# Patient Record
Sex: Female | Born: 1960
Health system: Southern US, Community
[De-identification: ages and names within clinical notes are randomized; demographics above are authoritative.]

## PROBLEM LIST (undated history)

## (undated) DIAGNOSIS — Z8711 Personal history of peptic ulcer disease: Secondary | ICD-10-CM

## (undated) DIAGNOSIS — F329 Major depressive disorder, single episode, unspecified: Secondary | ICD-10-CM

## (undated) DIAGNOSIS — M1990 Unspecified osteoarthritis, unspecified site: Secondary | ICD-10-CM

## (undated) DIAGNOSIS — E7800 Pure hypercholesterolemia, unspecified: Secondary | ICD-10-CM

## (undated) DIAGNOSIS — I219 Acute myocardial infarction, unspecified: Secondary | ICD-10-CM

## (undated) DIAGNOSIS — F419 Anxiety disorder, unspecified: Secondary | ICD-10-CM

## (undated) DIAGNOSIS — Z720 Tobacco use: Secondary | ICD-10-CM

## (undated) DIAGNOSIS — N8110 Cystocele, unspecified: Secondary | ICD-10-CM

## (undated) DIAGNOSIS — I10 Essential (primary) hypertension: Secondary | ICD-10-CM

## (undated) DIAGNOSIS — F32A Depression, unspecified: Secondary | ICD-10-CM

## (undated) DIAGNOSIS — Z8719 Personal history of other diseases of the digestive system: Secondary | ICD-10-CM

## (undated) DIAGNOSIS — I2510 Atherosclerotic heart disease of native coronary artery without angina pectoris: Secondary | ICD-10-CM

## (undated) DIAGNOSIS — E119 Type 2 diabetes mellitus without complications: Secondary | ICD-10-CM

## (undated) HISTORY — DX: Tobacco use: Z72.0

## (undated) HISTORY — DX: Cystocele, unspecified: N81.10

## (undated) HISTORY — PX: DILATION AND CURETTAGE OF UTERUS: SHX78

## (undated) HISTORY — DX: Depression, unspecified: F32.A

## (undated) HISTORY — DX: Personal history of peptic ulcer disease: Z87.11

## (undated) HISTORY — DX: Essential (primary) hypertension: I10

## (undated) HISTORY — DX: Unspecified osteoarthritis, unspecified site: M19.90

## (undated) HISTORY — DX: Major depressive disorder, single episode, unspecified: F32.9

## (undated) HISTORY — DX: Acute myocardial infarction, unspecified: I21.9

## (undated) HISTORY — PX: PILONIDAL CYST EXCISION: SHX744

## (undated) HISTORY — DX: Anxiety disorder, unspecified: F41.9

## (undated) HISTORY — DX: Personal history of other diseases of the digestive system: Z87.19

---

## 1999-12-26 ENCOUNTER — Other Ambulatory Visit: Admission: RE | Admit: 1999-12-26 | Discharge: 1999-12-26 | Payer: Self-pay | Admitting: *Deleted

## 2001-01-13 ENCOUNTER — Other Ambulatory Visit: Admission: RE | Admit: 2001-01-13 | Discharge: 2001-01-13 | Payer: Self-pay | Admitting: *Deleted

## 2004-04-03 ENCOUNTER — Other Ambulatory Visit: Admission: RE | Admit: 2004-04-03 | Discharge: 2004-04-03 | Payer: Self-pay | Admitting: Obstetrics and Gynecology

## 2004-06-05 ENCOUNTER — Ambulatory Visit (HOSPITAL_COMMUNITY): Admission: RE | Admit: 2004-06-05 | Discharge: 2004-06-05 | Payer: Self-pay | Admitting: Gastroenterology

## 2004-06-21 ENCOUNTER — Encounter: Admission: RE | Admit: 2004-06-21 | Discharge: 2004-06-21 | Payer: Self-pay | Admitting: Gastroenterology

## 2004-12-30 DIAGNOSIS — I219 Acute myocardial infarction, unspecified: Secondary | ICD-10-CM

## 2004-12-30 HISTORY — DX: Acute myocardial infarction, unspecified: I21.9

## 2004-12-30 HISTORY — PX: CORONARY ANGIOPLASTY WITH STENT PLACEMENT: SHX49

## 2013-11-09 DIAGNOSIS — N8110 Cystocele, unspecified: Secondary | ICD-10-CM | POA: Insufficient documentation

## 2016-06-21 DIAGNOSIS — S92301A Fracture of unspecified metatarsal bone(s), right foot, initial encounter for closed fracture: Secondary | ICD-10-CM | POA: Diagnosis not present

## 2016-06-25 ENCOUNTER — Ambulatory Visit: Payer: Self-pay | Admitting: Student

## 2017-05-01 ENCOUNTER — Ambulatory Visit (INDEPENDENT_AMBULATORY_CARE_PROVIDER_SITE_OTHER): Payer: BLUE CROSS/BLUE SHIELD | Admitting: Physician Assistant

## 2017-05-01 ENCOUNTER — Encounter: Payer: Self-pay | Admitting: Physician Assistant

## 2017-05-01 VITALS — BP 130/82 | Temp 98.3°F | Ht 65.75 in | Wt 172.0 lb

## 2017-05-01 DIAGNOSIS — I10 Essential (primary) hypertension: Secondary | ICD-10-CM

## 2017-05-01 DIAGNOSIS — Z720 Tobacco use: Secondary | ICD-10-CM | POA: Diagnosis not present

## 2017-05-01 DIAGNOSIS — F329 Major depressive disorder, single episode, unspecified: Secondary | ICD-10-CM

## 2017-05-01 DIAGNOSIS — F419 Anxiety disorder, unspecified: Secondary | ICD-10-CM | POA: Diagnosis not present

## 2017-05-01 DIAGNOSIS — E119 Type 2 diabetes mellitus without complications: Secondary | ICD-10-CM

## 2017-05-01 DIAGNOSIS — F339 Major depressive disorder, recurrent, unspecified: Secondary | ICD-10-CM | POA: Insufficient documentation

## 2017-05-01 DIAGNOSIS — F32A Depression, unspecified: Secondary | ICD-10-CM

## 2017-05-01 DIAGNOSIS — I252 Old myocardial infarction: Secondary | ICD-10-CM

## 2017-05-01 DIAGNOSIS — Z8739 Personal history of other diseases of the musculoskeletal system and connective tissue: Secondary | ICD-10-CM | POA: Diagnosis not present

## 2017-05-01 DIAGNOSIS — J309 Allergic rhinitis, unspecified: Secondary | ICD-10-CM | POA: Diagnosis not present

## 2017-05-01 LAB — CBC WITH DIFFERENTIAL/PLATELET
BASOS: 0 %
Basophils Absolute: 0 10*3/uL (ref 0.0–0.2)
EOS (ABSOLUTE): 0.3 10*3/uL (ref 0.0–0.4)
Eos: 3 %
Hematocrit: 39.9 % (ref 34.0–46.6)
Hemoglobin: 12.9 g/dL (ref 11.1–15.9)
IMMATURE GRANS (ABS): 0 10*3/uL (ref 0.0–0.1)
Immature Granulocytes: 0 %
Lymphocytes Absolute: 2.2 10*3/uL (ref 0.7–3.1)
Lymphs: 25 %
MCH: 27.3 pg (ref 26.6–33.0)
MCHC: 32.3 g/dL (ref 31.5–35.7)
MCV: 85 fL (ref 79–97)
MONOS ABS: 0.4 10*3/uL (ref 0.1–0.9)
Monocytes: 5 %
NEUTROS ABS: 6 10*3/uL (ref 1.4–7.0)
Neutrophils: 67 %
PLATELETS: 206 10*3/uL (ref 150–379)
RBC: 4.72 x10E6/uL (ref 3.77–5.28)
RDW: 16.3 % — AB (ref 12.3–15.4)
WBC: 9 10*3/uL (ref 3.4–10.8)

## 2017-05-01 LAB — CMP14+EGFR
A/G RATIO: 1.2 (ref 1.2–2.2)
ALT: 23 IU/L (ref 0–32)
AST: 19 IU/L (ref 0–40)
Albumin: 4.2 g/dL (ref 3.5–5.5)
Alkaline Phosphatase: 113 IU/L (ref 39–117)
BILIRUBIN TOTAL: 0.3 mg/dL (ref 0.0–1.2)
BUN/Creatinine Ratio: 20 (ref 9–23)
BUN: 20 mg/dL (ref 6–24)
CHLORIDE: 99 mmol/L (ref 96–106)
CO2: 21 mmol/L (ref 20–29)
Calcium: 9.6 mg/dL (ref 8.7–10.2)
Creatinine, Ser: 1.01 mg/dL — ABNORMAL HIGH (ref 0.57–1.00)
GFR calc non Af Amer: 62 mL/min/{1.73_m2} (ref >59)
GFR, EST AFRICAN AMERICAN: 71 mL/min/{1.73_m2} (ref >59)
GLOBULIN, TOTAL: 3.4 g/dL (ref 1.5–4.5)
Glucose: 248 mg/dL — ABNORMAL HIGH (ref 65–99)
POTASSIUM: 3.9 mmol/L (ref 3.5–5.2)
SODIUM: 138 mmol/L (ref 134–144)
TOTAL PROTEIN: 7.6 g/dL (ref 6.0–8.5)

## 2017-05-01 LAB — LIPID PANEL
Chol/HDL Ratio: 3.3 ratio (ref 0.0–4.4)
Cholesterol, Total: 175 mg/dL (ref 100–199)
HDL: 53 mg/dL (ref >39)
LDL Calculated: 86 mg/dL (ref 0–99)
TRIGLYCERIDES: 182 mg/dL — AB (ref 0–149)
VLDL Cholesterol Cal: 36 mg/dL (ref 5–40)

## 2017-05-01 LAB — POCT URINALYSIS DIP (MANUAL ENTRY)
BILIRUBIN UA: NEGATIVE
BILIRUBIN UA: NEGATIVE mg/dL
Glucose, UA: >=1000 mg/dL — AB
Leukocytes, UA: NEGATIVE
Nitrite, UA: NEGATIVE
PH UA: 5.5 (ref 5.0–8.0)
Protein Ur, POC: NEGATIVE mg/dL
RBC UA: NEGATIVE
Spec Grav, UA: 1.01 (ref 1.010–1.025)
Urobilinogen, UA: 0.2 E.U./dL

## 2017-05-01 LAB — POCT GLYCOSYLATED HEMOGLOBIN (HGB A1C): HEMOGLOBIN A1C: 10.6

## 2017-05-01 LAB — GLUCOSE, POCT (MANUAL RESULT ENTRY): POC GLUCOSE: 248 mg/dL — AB (ref 70–99)

## 2017-05-01 MED ORDER — LORATADINE 10 MG PO TABS: 10.0000 mg | ORAL_TABLET | Freq: Every day | ORAL | 3 refills | Status: DC

## 2017-05-01 MED ORDER — LISINOPRIL 10 MG PO TABS: 40.0000 mg | ORAL_TABLET | Freq: Every day | ORAL | 1 refills | Status: DC

## 2017-05-01 MED ORDER — GLIPIZIDE 10 MG PO TABS: 10.0000 mg | ORAL_TABLET | Freq: Two times a day (BID) | ORAL | 0 refills | Status: DC

## 2017-05-01 MED ORDER — DAPAGLIFLOZIN PROPANEDIOL 10 MG PO TABS: 10.0000 mg | ORAL_TABLET | Freq: Every day | ORAL | 0 refills | Status: DC

## 2017-05-01 MED ORDER — ASPIRIN 325 MG PO TBEC: 325.0000 mg | DELAYED_RELEASE_TABLET | Freq: Every day | ORAL | 3 refills | Status: DC

## 2017-05-01 MED ORDER — MELOXICAM 15 MG PO TABS: 15.0000 mg | ORAL_TABLET | Freq: Every day | ORAL | 0 refills | Status: DC | PRN

## 2017-05-01 MED ORDER — BUPROPION HCL ER (XL) 150 MG PO TB24: 150.0000 mg | ORAL_TABLET | Freq: Every day | ORAL | 0 refills | Status: DC

## 2017-05-01 MED ORDER — SIMVASTATIN 40 MG PO TABS: 40.0000 mg | ORAL_TABLET | Freq: Every day | ORAL | 3 refills | Status: DC

## 2017-05-01 MED ORDER — HYDROCHLOROTHIAZIDE 25 MG PO TABS: 25.0000 mg | ORAL_TABLET | Freq: Every day | ORAL | 1 refills | Status: DC

## 2017-05-01 MED ORDER — METOPROLOL TARTRATE 25 MG PO TABS: 25.0000 mg | ORAL_TABLET | Freq: Two times a day (BID) | ORAL | 1 refills | Status: DC

## 2017-05-01 NOTE — Patient Instructions (Addendum)
Ask your insurance about getting pneumovax 23 vaccine.    For diabetes, continue medications as prescribed. You need to focus on decreasing any extra carbs and sweets in your diet. Your fasting blood sugar goal is between 70-100. Right now you running at 240. Please check sugars daily. I have placed a referral for diabetes nutrition, which I highly recommend. They should call you within one-two weeks. I recommend you follow up for repeat glucose in office in 2 weeks for a full diabetes follow up visit. Make sure you are fasting at this visit, no food or drink 8 hours prior to visit. Below is some info about diabetes for you to read.   For high blood pressure, I have given you enough refills for the next six months. BP gaol is <140/90.  For anxiety and depression, I have refilled wellbutrin at 141m daily. We can monitor this and it may have to be increased to 3073mdaily but we will do this gradually.   We should have your lab results within one week and will contact you with these results. Thank you for letting me participate in your health and well being.  Blood Glucose Monitoring, Adult Monitoring your blood sugar (glucose) helps you manage your diabetes. It also helps you and your health care provider determine how well your diabetes management plan is working. Blood glucose monitoring involves checking your blood glucose as often as directed, and keeping a record (log) of your results over time. Why should I monitor my blood glucose? Checking your blood glucose regularly can:  Help you understand how food, exercise, illnesses, and medicines affect your blood glucose.  Let you know what your blood glucose is at any time. You can quickly tell if you are having low blood glucose (hypoglycemia) or high blood glucose (hyperglycemia).  Help you and your health care provider adjust your medicines as needed.  When should I check my blood glucose? Follow instructions from your health care provider  about how often to check your blood glucose. This may depend on:  The type of diabetes you have.  How well-controlled your diabetes is.  Medicines you are taking.  If you have type 1 diabetes:  Check your blood glucose at least 2 times a day.  Also check your blood glucose: ? Before every insulin injection. ? Before and after exercise. ? Between meals. ? 2 hours after a meal. ? Occasionally between 2:00 a.m. and 3:00 a.m., as directed. ? Before potentially dangerous tasks, like driving or using heavy machinery. ? At bedtime.  You may need to check your blood glucose more often, up to 6-10 times a day: ? If you use an insulin pump. ? If you need multiple daily injections (MDI). ? If your diabetes is not well-controlled. ? If you are ill. ? If you have a history of severe hypoglycemia. ? If you have a history of not knowing when your blood glucose is getting low (hypoglycemia unawareness). If you have type 2 diabetes:  If you take insulin or other diabetes medicines, check your blood glucose at least 2 times a day.  If you are on intensive insulin therapy, check your blood glucose at least 4 times a day. Occasionally, you may also need to check between 2:00 a.m. and 3:00 a.m., as directed.  Also check your blood glucose: ? Before and after exercise. ? Before potentially dangerous tasks, like driving or using heavy machinery.  You may need to check your blood glucose more often if: ? Your  medicine is being adjusted. ? Your diabetes is not well-controlled. ? You are ill. What is a blood glucose log?  A blood glucose log is a record of your blood glucose readings. It helps you and your health care provider: ? Look for patterns in your blood glucose over time. ? Adjust your diabetes management plan as needed.  Every time you check your blood glucose, write down your result and notes about things that may be affecting your blood glucose, such as your diet and exercise for the  day.  Most glucose meters store a record of glucose readings in the meter. Some meters allow you to download your records to a computer. How do I check my blood glucose? Follow these steps to get accurate readings of your blood glucose: Supplies needed   Blood glucose meter.  Test strips for your meter. Each meter has its own strips. You must use the strips that come with your meter.  A needle to prick your finger (lancet). Do not use lancets more than once.  A device that holds the lancet (lancing device).  A journal or log book to write down your results. Procedure  Wash your hands with soap and water.  Prick the side of your finger (not the tip) with the lancet. Use a different finger each time.  Gently rub the finger until a small drop of blood appears.  Follow instructions that come with your meter for inserting the test strip, applying blood to the strip, and using your blood glucose meter.  Write down your result and any notes. Alternative testing sites  Some meters allow you to use areas of your body other than your finger (alternative sites) to test your blood.  If you think you may have hypoglycemia, or if you have hypoglycemia unawareness, do not use alternative sites. Use your finger instead.  Alternative sites may not be as accurate as the fingers, because blood flow is slower in these areas. This means that the result you get may be delayed, and it may be different from the result that you would get from your finger.  The most common alternative sites are: ? Forearm. ? Thigh. ? Palm of the hand. Additional tips  Always keep your supplies with you.  If you have questions or need help, all blood glucose meters have a 24-hour "hotline" number that you can call. You may also contact your health care provider.  After you use a few boxes of test strips, adjust (calibrate) your blood glucose meter by following instructions that came with your meter. This  information is not intended to replace advice given to you by your health care provider. Make sure you discuss any questions you have with your health care provider. Document Released: 09/11/2003 Document Revised: 03/28/2016 Document Reviewed: 02/18/2016 Elsevier Interactive Patient Education  2017 [place] for Diabetes Mellitus, Adult Carbohydrate counting is a method for keeping track of how many carbohydrates you eat. Eating carbohydrates naturally increases the amount of sugar (glucose) in the blood. Counting how many carbohydrates you eat helps keep your blood glucose within normal limits, which helps you manage your diabetes (diabetes mellitus). It is important to know how many carbohydrates you can safely have in each meal. This is different for every person. A diet and nutrition specialist (registered dietitian) can help you make a meal plan and calculate how many carbohydrates you should have at each meal and snack. Carbohydrates are found in the following foods:  Grains, such  as breads and cereals.  Dried beans and soy products.  Starchy vegetables, such as potatoes, peas, and corn.  Fruit and fruit juices.  Milk and yogurt.  Sweets and snack foods, such as cake, cookies, candy, chips, and soft drinks.  How do I count carbohydrates? There are two ways to count carbohydrates in food. You can use either of the methods or a combination of both. Reading "Nutrition Facts" on packaged food The "Nutrition Facts" list is included on the labels of almost all packaged foods and beverages in the [place] It includes:  The serving size.  Information about nutrients in each serving, including the grams (g) of carbohydrate per serving.  To use the "Nutrition Facts":  Decide how many servings you will have.  Multiply the number of servings by the number of carbohydrates per serving.  The resulting number is the total amount of carbohydrates that you will be  having.  Learning standard serving sizes of other foods When you eat foods containing carbohydrates that are not packaged or do not include "Nutrition Facts" on the label, you need to measure the servings in order to count the amount of carbohydrates:  Measure the foods that you will eat with a food scale or measuring cup, if needed.  Decide how many standard-size servings you will eat.  Multiply the number of servings by 15. Most carbohydrate-rich foods have about 15 g of carbohydrates per serving. ? For example, if you eat 8 oz (170 g) of strawberries, you will have eaten 2 servings and 30 g of carbohydrates (2 servings x 15 g = 30 g).  For foods that have more than one food mixed, such as soups and casseroles, you must count the carbohydrates in each food that is included.  The following list contains standard serving sizes of common carbohydrate-rich foods. Each of these servings has about 15 g of carbohydrates:   hamburger bun or  English muffin.   oz (15 mL) syrup.   oz (14 g) jelly.  1 slice of bread.  1 six-inch tortilla.  3 oz (85 g) cooked rice or pasta.  4 oz (113 g) cooked dried beans.  4 oz (113 g) starchy vegetable, such as peas, corn, or potatoes.  4 oz (113 g) hot cereal.  4 oz (113 g) mashed potatoes or  of a large baked potato.  4 oz (113 g) canned or frozen fruit.  4 oz (120 mL) fruit juice.  4-6 crackers.  6 chicken nuggets.  6 oz (170 g) unsweetened dry cereal.  6 oz (170 g) plain fat-free yogurt or yogurt sweetened with artificial sweeteners.  8 oz (240 mL) milk.  8 oz (170 g) fresh fruit or one small piece of fruit.  24 oz (680 g) popped popcorn.  Example of carbohydrate counting Sample meal  3 oz (85 g) chicken breast.  6 oz (170 g) brown rice.  4 oz (113 g) corn.  8 oz (240 mL) milk.  8 oz (170 g) strawberries with sugar-free whipped topping. Carbohydrate calculation 1. Identify the foods that contain  carbohydrates: ? Rice. ? Corn. ? Milk. ? Strawberries. 2. Calculate how many servings you have of each food: ? 2 servings rice. ? 1 serving corn. ? 1 serving milk. ? 1 serving strawberries. 3. Multiply each number of servings by 15 g: ? 2 servings rice x 15 g = 30 g. ? 1 serving corn x 15 g = 15 g. ? 1 serving milk x 15 g = 15 g. ?  1 serving strawberries x 15 g = 15 g. 4. Add together all of the amounts to find the total grams of carbohydrates eaten: ? 30 g + 15 g + 15 g + 15 g = 75 g of carbohydrates total. This information is not intended to replace advice given to you by your health care provider. Make sure you discuss any questions you have with your health care provider. Document Released: 09/08/2005 Document Revised: 03/28/2016 Document Reviewed: 02/20/2016 Elsevier Interactive Patient Education  2018 [employer]. Diabetes Mellitus and Food It is important for you to manage your blood sugar (glucose) level. Your blood glucose level can be greatly affected by what you eat. Eating healthier foods in the appropriate amounts throughout the day at about the same time each day will help you control your blood glucose level. It can also help slow or prevent worsening of your diabetes mellitus. Healthy eating may even help you improve the level of your blood pressure and reach or maintain a healthy weight. General recommendations for healthful eating and cooking habits include:  Eating meals and snacks regularly. Avoid going long periods of time without eating to lose weight.  Eating a diet that consists mainly of plant-based foods, such as fruits, vegetables, nuts, legumes, and whole grains.  Using low-heat cooking methods, such as baking, instead of high-heat cooking methods, such as deep frying.  Work with your dietitian to make sure you understand how to use the Nutrition Facts information on food labels. How can food affect me? Carbohydrates Carbohydrates affect your blood  glucose level more than any other type of food. Your dietitian will help you determine how many carbohydrates to eat at each meal and teach you how to count carbohydrates. Counting carbohydrates is important to keep your blood glucose at a healthy level, especially if you are using insulin or taking certain medicines for diabetes mellitus. Alcohol Alcohol can cause sudden decreases in blood glucose (hypoglycemia), especially if you use insulin or take certain medicines for diabetes mellitus. Hypoglycemia can be a life-threatening condition. Symptoms of hypoglycemia (sleepiness, dizziness, and disorientation) are similar to symptoms of having too much alcohol. If your health care provider has given you approval to drink alcohol, do so in moderation and use the following guidelines:  Women should not have more than one drink per day, and men should not have more than two drinks per day. One drink is equal to: ? 12 oz of beer. ? 5 oz of wine. ? 1 oz of hard liquor.  Do not drink on an empty stomach.  Keep yourself hydrated. Have water, diet soda, or unsweetened iced tea.  Regular soda, juice, and other mixers might contain a lot of carbohydrates and should be counted.  What foods are not recommended? As you make food choices, it is important to remember that all foods are not the same. Some foods have fewer nutrients per serving than other foods, even though they might have the same number of calories or carbohydrates. It is difficult to get your body what it needs when you eat foods with fewer nutrients. Examples of foods that you should avoid that are high in calories and carbohydrates but low in nutrients include:  Trans fats (most processed foods list trans fats on the Nutrition Facts label).  Regular soda.  Juice.  Candy.  Sweets, such as cake, pie, doughnuts, and cookies.  Fried foods.  What foods can I eat? Eat nutrient-rich foods, which will nourish your body and keep you  healthy.  The food you should eat also will depend on several factors, including:  The calories you need.  The medicines you take.  Your weight.  Your blood glucose level.  Your blood pressure level.  Your cholesterol level.  You should eat a variety of foods, including:  Protein. ? Lean cuts of meat. ? Proteins low in saturated fats, such as fish, egg whites, and beans. Avoid processed meats.  Fruits and vegetables. ? Fruits and vegetables that may help control blood glucose levels, such as apples, mangoes, and yams.  Dairy products. ? Choose fat-free or low-fat dairy products, such as milk, yogurt, and cheese.  Grains, bread, pasta, and rice. ? Choose whole grain products, such as multigrain bread, whole oats, and brown rice. These foods may help control blood pressure.  Fats. ? Foods containing healthful fats, such as nuts, avocado, olive oil, canola oil, and fish.  Does everyone with diabetes mellitus have the same meal plan? Because every person with diabetes mellitus is different, there is not one meal plan that works for everyone. It is very important that you meet with a dietitian who will help you create a meal plan that is just right for you. This information is not intended to replace advice given to you by your health care provider. Make sure you discuss any questions you have with your health care provider. Document Released: 06/05/2005 Document Revised: 02/14/2016 Document Reviewed: 08/05/2013 Elsevier Interactive Patient Education  2017 [employer].  IF you received an x-ray today, you will receive an invoice from [facility]. Please contact [facility] at [phone] with questions or concerns regarding your invoice.   IF you received labwork today, you will receive an invoice from [facility]. Please contact [facility] at [phone] with questions or concerns regarding your invoice.   Our billing staff will not be able to assist you with  questions regarding bills from these companies.  You will be contacted with the lab results as soon as they are available. The fastest way to get your results is to activate your My Chart account. Instructions are located on the last page of this paperwork. If you have not heard from [place] regarding the results in 2 weeks, please contact this office.

## 2017-05-01 NOTE — Progress Notes (Addendum)
Victoria Eaton  MRN: [id number] DOB: 08-11-1961  Subjective:  Victoria Eaton is a 56 y.o. female seen in office today for a chief complaint of medication refill for chronic conditions. Pt moved here from [place] about a year ago. Has not been to a doctor since she has been here. She took her last pill of most of her medications (she thinks?) this morning. Has been out of wellbutrin for at least a month.   Chronic disorders: 1) T2DM: Dx made in 2014. Controlled on farxiga 37m daily and glipizide 151mBID. Does not take metformin because she remembers reacting badly to it. She does not check sugars at home. She cannot recall what her last A1C was. Denies blurred vision, polyuria, polyphagia, polydipsia, decreased urine, abdominal pain, nausea, vomiting, and parasthesias. Avoids excess sugars and carbs. Does not eat rice, limits bread with meals. Drinks water and diet coke and wine. No structured exercise but does walk daily. Smokes 0.5 ppd x 45 years. No known complications. Has not received any vaccines.   2) HTN: Dx made years ago. Controlled on HCTZ 2583mnd lisinopril 90m14mily and metoprolol tartrate 25mg44m.   2) Depression and Anxiety: She has a lot of stress and sad mood. Daughter is bipolar, husband is a recovering alcoholic. Works three jobs ([employer], [employer]). Was taking buproprion hcl 100mg 62m Has been out of this medication for quite some time. She states she felt less sad and less anxious on this medication.   4) Seasonal allergies: Takes claritin 90mg d51m. She has a bunny at home and is allergic to it every now and then but is controlled on claritin 90mg da40m   5) Arthritis in left foot: Takes meloxicam 15mg onc59mweek as needed for pain. Will tylenol first for pain.  6) Hx of MI: Had MI in 2006. Takes aspirin 325mg dail39metoprolol tartrate 25mg BID, 26msimvastatin 40mg at nig38mPt is still smoking 0.5 ppd. Not interested in stopping at this time. Pt  has FH of heart disease in father, cannot recall what age. She has never followed cardiology.   Other OTC meds: multivitmain and krill oil daily  Pt had coffee this morning but no food.   Review of Systems  Constitutional: Negative for chills, diaphoresis, fatigue and fever.  Eyes: Negative for visual disturbance.  Respiratory: Negative for cough and shortness of breath.   Cardiovascular: Negative for chest pain, palpitations and leg swelling.  Gastrointestinal: Negative for abdominal pain, anal bleeding, nausea and vomiting.  Neurological: Negative for dizziness, weakness, light-headedness and headaches.    There are no active problems to display for this patient.   No current outpatient prescriptions on file prior to visit.   No current facility-administered medications on file prior to visit.     Allergies  Allergen Reactions  . Kiwi Extract Hives  . Shrimp [Shellfish Allergy] Hives  . Penicillins     Reaction as a child       Social History   Social History  . Marital status: Married    Spouse name: N/A  . Number of children: N/A  . Years of education: N/A   Occupational History  . Not on file.   Social History Main Topics  . Smoking status: Current Every Day Smoker    Packs/day: 0.50    Years: 45.00    Types: Cigarettes    Start date: 05/01/1972  . Smokeless tobacco: Never Used  . Alcohol use 12.0 oz/week  20 Glasses of wine per week  . Drug use: No  . Sexual activity: No   Other Topics Concern  . Not on file   Social History Narrative  . No narrative on file    Objective:  BP 130/82 (BP Location: Left Arm, Patient Position: Sitting, Cuff Size: Large)   Temp 98.3 F (36.8 C) (Oral)   Ht 5' 5.75" (1.67 m)   Wt 172 lb (78 kg)   BMI 27.97 kg/m   Physical Exam  Constitutional: She is oriented to person, place, and time and well-developed, well-nourished, and in no distress.  HENT:  Head: Normocephalic and atraumatic.  Eyes: Conjunctivae are  normal.  Neck: Normal range of motion.  Cardiovascular: Normal rate, regular rhythm and normal heart sounds.   Pulmonary/Chest: Effort normal and breath sounds normal.  Abdominal: Soft. Bowel sounds are normal. There is no tenderness.  Neurological: She is alert and oriented to person, place, and time. Gait normal.  Skin: Skin is warm and dry.  Psychiatric: Affect normal.  Vitals reviewed.    Results for orders placed or performed in visit on 05/01/17 (from the past 24 hour(s))  POCT glucose (manual entry)     Status: Abnormal   Collection Time: 05/01/17  9:55 AM  Result Value Ref Range   POC Glucose 248 (A) 70 - 99 mg/dl  POCT glycosylated hemoglobin (Hb A1C)     Status: None   Collection Time: 05/01/17  9:59 AM  Result Value Ref Range   Hemoglobin A1C 10.6   POCT urinalysis dipstick     Status: Abnormal   Collection Time: 05/01/17 10:16 AM  Result Value Ref Range   Color, UA yellow yellow   Clarity, UA clear clear   Glucose, UA >=1,000 (A) negative mg/dL   Bilirubin, UA negative negative   Ketones, POC UA negative negative mg/dL   Spec Grav, UA 1.010 1.010 - 1.025   Blood, UA negative negative   pH, UA 5.5 5.0 - 8.0   Protein Ur, POC negative negative mg/dL   Urobilinogen, UA 0.2 0.2 or 1.0 E.U./dL   Nitrite, UA Negative Negative   Leukocytes, UA Negative Negative    Assessment and Plan :  Requested that patient contact her prior PCP office in [place] and have them fax over her previous medical charts pertaining to her chronic disorders for better management of her choric conditions.   1. Type 2 diabetes mellitus without complication, without long-term current use of insulin (HCC) Uncontrolled. Pt is asymptomatic. Had in depth discussion about lifestyle modifications. Given educational material on diet and diabetes. Encouraged to check FBS daily, she has a monitor at home. FBS goal is 70-100. Follow up in 2 weeks for proper diabetes office visit. Plan to recheck POCT  glucose at that time.  - POCT glucose (manual entry) - POCT glycosylated hemoglobin (Hb A1C) - POCT urinalysis dipstick - CBC with Differential/Platelet - CMP14+EGFR - Lipid panel - Microalbumin/Creatinine Ratio, Urine - glipiZIDE (GLUCOTROL) 10 MG tablet; Take 1 tablet (10 mg total) by mouth 2 (two) times daily before a meal.  Dispense: 180 tablet; Refill: 0 - dapagliflozin propanediol (FARXIGA) 10 MG TABS tablet; Take 10 mg by mouth daily.  Dispense: 90 tablet; Refill: 0 - Ambulatory referral to diabetic education - Ambulatory referral to Ophthalmology  2. Essential hypertension Controlled. Continue medications as prescribed.  - CMP14+EGFR - hydrochlorothiazide (HYDRODIURIL) 25 MG tablet; Take 1 tablet (25 mg total) by mouth daily.  Dispense: 90 tablet; Refill: 1 - lisinopril (PRINIVIL,ZESTRIL)  10 MG tablet; Take 4 tablets (40 mg total) by mouth at bedtime.  Dispense: 90 tablet; Refill: 1  3. Anxiety and depression Uncontrolled. Plan to restart wellbutrin at 111m daily. Will reevaluate at follow up, may need to increase to 3019mdaily at that time.  - buPROPion (WELLBUTRIN XL) 150 MG 24 hr tablet; Take 1 tablet (150 mg total) by mouth daily.  Dispense: 90 tablet; Refill: 0  4. History of MI (myocardial infarction) Continue medications as prescribed.  - metoprolol tartrate (LOPRESSOR) 25 MG tablet; Take 1 tablet (25 mg total) by mouth 2 (two) times daily.  Dispense: 180 tablet; Refill: 1 - aspirin 325 MG EC tablet; Take 1 tablet (325 mg total) by mouth at bedtime.  Dispense: 90 tablet; Refill: 3 - simvastatin (ZOCOR) 40 MG tablet; Take 1 tablet (40 mg total) by mouth at bedtime.  Dispense: 90 tablet; Refill: 3  5. Allergic rhinitis, unspecified seasonality, unspecified trigger - loratadine (CLARITIN) 10 MG tablet; Take 1 tablet (10 mg total) by mouth daily.  Dispense: 90 tablet; Refill: 3  6. History of arthritis - meloxicam (MOBIC) 15 MG tablet; Take 1 tablet (15 mg total) by  mouth daily as needed for pain.  Dispense: 30 tablet; Refill: 0  7. Tobacco abuse Smoking cessation provided to patient. She is not interested in quitting smoking at this time.   BrTenna DelainePA-C  [facility]/06/2017 6:42 PM

## 2017-05-02 LAB — MICROALBUMIN / CREATININE URINE RATIO
Creatinine, Urine: 37.1 mg/dL
MICROALB/CREAT RATIO: 26.7 mg/g{creat} (ref 0.0–30.0)
Microalbumin, Urine: 9.9 ug/mL

## 2017-05-06 ENCOUNTER — Encounter: Payer: Self-pay | Admitting: Radiology

## 2017-05-20 ENCOUNTER — Ambulatory Visit (INDEPENDENT_AMBULATORY_CARE_PROVIDER_SITE_OTHER): Payer: BLUE CROSS/BLUE SHIELD | Admitting: Physician Assistant

## 2017-05-20 ENCOUNTER — Encounter: Payer: Self-pay | Admitting: Physician Assistant

## 2017-05-20 VITALS — BP 138/78 | HR 68 | Temp 97.9°F | Resp 18 | Ht 65.63 in | Wt 166.5 lb

## 2017-05-20 DIAGNOSIS — R194 Change in bowel habit: Secondary | ICD-10-CM

## 2017-05-20 DIAGNOSIS — Z9119 Patient's noncompliance with other medical treatment and regimen: Secondary | ICD-10-CM

## 2017-05-20 DIAGNOSIS — Z91013 Allergy to seafood: Secondary | ICD-10-CM | POA: Diagnosis not present

## 2017-05-20 DIAGNOSIS — Z91199 Patient's noncompliance with other medical treatment and regimen due to unspecified reason: Secondary | ICD-10-CM

## 2017-05-20 DIAGNOSIS — E119 Type 2 diabetes mellitus without complications: Secondary | ICD-10-CM | POA: Diagnosis not present

## 2017-05-20 LAB — GLUCOSE, POCT (MANUAL RESULT ENTRY): POC GLUCOSE: 206 mg/dL — AB (ref 70–99)

## 2017-05-20 MED ORDER — EPINEPHRINE 0.3 MG/0.3ML IJ SOAJ: 0.3000 mg | Freq: Once | INTRAMUSCULAR | 1 refills | Status: DC | PRN

## 2017-05-20 MED ORDER — METFORMIN HCL ER 500 MG PO TB24: ORAL_TABLET | ORAL | 0 refills | Status: DC

## 2017-05-20 MED ORDER — LISINOPRIL 40 MG PO TABS: 40.0000 mg | ORAL_TABLET | Freq: Every day | ORAL | 1 refills | Status: DC

## 2017-05-20 NOTE — Progress Notes (Signed)
MRN: [id number]  Subjective:   Victoria Eaton is a 56 y.o. female who presents for follow up on elevated blood sugars. Pt last ate 2 hours ago. Patient last seen on 05/01/17 for medication refill all of her chronic conditions. In terms of type 2 diabetes she had been out of her medication for some time. Blood glucose was 248 and A1C was 10.6. She was restarted on glipizide and farxiga and told to return in 2 weeks.  T2DM dx was made in 2014. She has been controlled on farxiga 65m daily and glipizide 150mBID. Pt does not know why she was taken off metformin but does not recall a bad reaction to it.   Since last visit, patient is checking home blood sugars. Home blood sugar records: BGs range between 140 and 170. Current symptoms include none. Patient denies foot ulcerations, increased appetite, nausea, paresthesia of the feet, polydipsia, polyuria, visual disturbances, vomiting and weight loss. Patient is not checking their feet daily. No foot concerns.  Diet consists of a variety of things. She eats red meat, chicken, breads, pastas, vegetables, and fruits. Drinks mostly water. Does not exercise.Smokes daily.   Known diabetic complications: none  Immunizations: Flu vaccine: 05/16/17, pneumococal vaccine: never  Other concerns:  1) Needs refill for epipen for shellfish allergy. Notes she had an allergic reaction to shellfish once in her past and was given Rx for epipen. Has not had to use it since. Her last one expired. Would also like referral to allergy specialist for food allergy testing as she is concerned as to what other food allergies she may have.   2) Questions why I did not refill lialda last time. When she went to pick up her meds at [facility] they told her she needed refills of it. She has been out of it for >49m42monthStates she takes it for ulcers. [place] Was supposed to follow up but did not go. Has occassional bowel urgency, frequency,  mucopurulent and bloody stools. Was dx with "ulcers" in the early 2000s. Has been on some medication since but she cannot recall what it was. Last colonoscopy was maybe 2016. Was told to follow up in one year but did not follow up.     Objective:   PHYSICAL EXAM BP 138/78 (BP Location: Right Arm, Patient Position: Sitting, Cuff Size: Normal)   Pulse 68   Temp 97.9 F (36.6 C) (Oral)   Resp 18   Ht 5' 5.63" (1.667 m)   Wt 166 lb 8 oz (75.5 kg)   SpO2 98%   BMI 27.18 kg/m   Physical Exam  Constitutional: She is oriented to person, place, and time. She appears well-developed and well-nourished.  HENT:  Head: Normocephalic and atraumatic.  Eyes: Conjunctivae are normal.  Neck: Normal range of motion.  Cardiovascular: Normal rate, regular rhythm and normal heart sounds.   Pulmonary/Chest: Effort normal and breath sounds normal.  Abdominal: Soft. Normal appearance. There is no tenderness.  Neurological: She is alert and oriented to person, place, and time.  Skin: Skin is warm and dry.  Psychiatric: She has a normal mood and affect.  Vitals reviewed.   Diabetic Foot Exam - Simple   Simple Foot Form Visual Inspection No deformities, no ulcerations, no other skin breakdown bilaterally:  Yes Sensation Testing Intact to touch and monofilament testing bilaterally:  Yes Pulse Check Posterior Tibialis and Dorsalis pulse intact bilaterally:  Yes Comments No toe nail on big to both  feet     Results for orders placed or performed in visit on 05/20/17 (from the past 24 hour(s))  POCT glucose (manual entry)     Status: Abnormal   Collection Time: 05/20/17 11:19 AM  Result Value Ref Range   POC Glucose 206 (A) 70 - 99 mg/dl    Assessment and Plan :   1. Type 2 diabetes mellitus without complication, without long-term current use of insulin (HCC) Glucose remains elevated despite restarting medication. Recommend starting metformin at this time, pt agrees with tx plan. Will slowly  taper up to metformin 1014m BID. She has not followed up with the diabetic nutrition referral that was placed during her last visit. States she has not checked her voicemail but will do that today. Encouraged lifestyle modifications. Continue checking blood glucose at home. Return in 3 months for reevaluation.  -Declines pneumovax today - HM Diabetes Foot Exam - POCT glucose (manual entry) - metFORMIN (GLUCOPHAGE XR) 500 MG 24 hr tablet; Wk 1:1 tablet daily.Wk 2:2 tablets daily.Wk 3: 2 tablets in am and 1 in pm daily.Wk 4 and so on: 2 tablets in am and pm with food daily.  Dispense: 360 tablet; Refill: 0  2. Frequent bowel movements Pt is unsure as to the exact condition she was taking lialda for and has not contacted [place] discussed last [place]) to have her medical records sent here. I have therefore placed referral to GI and will have them further evaluate her for her symptoms. Strongly encouraged pt to bring her prior medical records to this visit.  - Ambulatory referral to Gastroenterology  3. Allergy to shellfish - EPINEPHrine (EPIPEN 2-PAK) 0.3 mg/0.3 mL IJ SOAJ injection; Inject 0.3 mLs (0.3 mg total) into the muscle once as needed.  Dispense: 1 Device; Refill: 1 - Ambulatory referral to Allergy  4. Medical non-compliance  BTenna Delaine PA-C  [facility]8/30/2018 10:40 PM

## 2017-05-20 NOTE — Patient Instructions (Addendum)
For better diabetes control, I recommend starting metformin again along with your other medications. We will taper you up to 1062m twice daily. Continue checking FBS daily. I have given you some info below about diabetes and nutrition. I do recommend following up with diabetes education. They should have contacted you by now so make sure you check your voicemail and return their call. Please follow up in 3 months in office for diabetes follow up.   For ulcers, please follow up with GI. I have placed a referral and they will contact you within the 1-2 weeks.   For mammogram, the info to schedule one is below.   IF you received an x-ray today, you will receive an [facility]. Please contact [facility] at [phone] questions or concerns regarding your invoice.   IF you received labwork today, you will receive an invoice from [place] Please contact [facility] at [phone] questions or concerns regarding your invoice.   Our billing staff will not be able to assist you with questions regarding bills from these companies.  You will be contacted with the lab results as soon as they are available. The fastest way to get your results is to activate your My Chart account. Instructions are located on the last page of this paperwork. If you have not heard from [place] results in 2 weeks, please contact this office.    We recommend that you schedule a mammogram for breast cancer screening. Typically, you do not need a referral to do this. Please contact a local imaging center to schedule your mammogram.  [facility] ([phone] *ask for the Radiology Department [facility] [place] - [phone] [facility] - [phone] - [phone] *[facility]- ([phone] *ask  for the Radiology Department [facility] - [phone] *ask for the [place]- ([phone]   Diabetes Mellitus and Food It is important for you to manage your blood sugar (glucose) level. Your blood glucose level can be greatly affected by what you eat. Eating healthier foods in the appropriate amounts throughout the day at about the same time each day will help you control your blood glucose level. It can also help slow or prevent worsening of your diabetes mellitus. Healthy eating may even help you improve the level of your blood pressure and reach or maintain a healthy weight. General recommendations for healthful eating and cooking habits include:  Eating meals and snacks regularly. Avoid going long periods of time without eating to lose weight.  Eating a diet that consists mainly of plant-based foods, such as fruits, vegetables, nuts, legumes, and whole grains.  Using low-heat cooking methods, such as baking, instead of high-heat cooking methods, such as deep frying.  Work with your dietitian to make sure you understand how to use the Nutrition Facts information on food labels. How can food affect me? Carbohydrates Carbohydrates affect your blood glucose level more than any other type of food. Your dietitian will help you determine how many carbohydrates to eat at each meal and teach you how to count carbohydrates. Counting carbohydrates is important to keep your blood glucose at a healthy level, especially if you are using insulin or taking certain medicines for diabetes mellitus. Alcohol Alcohol can cause sudden decreases in blood glucose (hypoglycemia), especially if  you use insulin or take certain medicines for diabetes mellitus. Hypoglycemia can be a life-threatening condition. Symptoms of hypoglycemia (sleepiness, dizziness, and disorientation) are similar to symptoms of having too much alcohol. If your health care provider has given  you approval to drink alcohol, do so in moderation and use the following guidelines:  Women should not have more than one drink per day, and men should not have more than two drinks per day. One drink is equal to: ? 12 oz of beer. ? 5 oz of wine. ? 1 oz of hard liquor.  Do not drink on an empty stomach.  Keep yourself hydrated. Have water, diet soda, or unsweetened iced tea.  Regular soda, juice, and other mixers might contain a lot of carbohydrates and should be counted.  What foods are not recommended? As you make food choices, it is important to remember that all foods are not the same. Some foods have fewer nutrients per serving than other foods, even though they might have the same number of calories or carbohydrates. It is difficult to get your body what it needs when you eat foods with fewer nutrients. Examples of foods that you should avoid that are high in calories and carbohydrates but low in nutrients include:  Trans fats (most processed foods list trans fats on the Nutrition Facts label).  Regular soda.  Juice.  Candy.  Sweets, such as cake, pie, doughnuts, and cookies.  Fried foods.  What foods can I eat? Eat nutrient-rich foods, which will nourish your body and keep you healthy. The food you should eat also will depend on several factors, including:  The calories you need.  The medicines you take.  Your weight.  Your blood glucose level.  Your blood pressure level.  Your cholesterol level.  You should eat a variety of foods, including:  Protein. ? Lean cuts of meat. ? Proteins low in saturated fats, such as fish, egg whites, and beans. Avoid processed meats.  Fruits and vegetables. ? Fruits and vegetables that may help control blood glucose levels, such as apples, mangoes, and yams.  Dairy products. ? Choose fat-free or low-fat dairy products, such as milk, yogurt, and cheese.  Grains, bread, pasta, and rice. ? Choose whole grain products, such  as multigrain bread, whole oats, and brown rice. These foods may help control blood pressure.  Fats. ? Foods containing healthful fats, such as nuts, avocado, olive oil, canola oil, and fish.  Does everyone with diabetes mellitus have the same meal plan? Because every person with diabetes mellitus is different, there is not one meal plan that works for everyone. It is very important that you meet with a dietitian who will help you create a meal plan that is just right for you. This information is not intended to replace advice given to you by your health care provider. Make sure you discuss any questions you have with your health care provider. Document Released: 06/05/2005 Document Revised: 02/14/2016 Document Reviewed: 08/05/2013 Elsevier Interactive Patient Education  2017 [employer].

## 2017-06-17 DIAGNOSIS — H10023 Other mucopurulent conjunctivitis, bilateral: Secondary | ICD-10-CM | POA: Diagnosis not present

## 2017-07-07 ENCOUNTER — Encounter: Payer: Self-pay | Admitting: Allergy & Immunology

## 2017-08-06 ENCOUNTER — Other Ambulatory Visit: Payer: Self-pay | Admitting: Physician Assistant

## 2017-08-06 DIAGNOSIS — E119 Type 2 diabetes mellitus without complications: Secondary | ICD-10-CM

## 2017-08-06 DIAGNOSIS — F32A Depression, unspecified: Secondary | ICD-10-CM

## 2017-08-06 DIAGNOSIS — F329 Major depressive disorder, single episode, unspecified: Secondary | ICD-10-CM

## 2017-08-06 DIAGNOSIS — F419 Anxiety disorder, unspecified: Principal | ICD-10-CM

## 2017-08-06 NOTE — Telephone Encounter (Signed)
Please advise/refill  buPROPion (WELLBUTRIN XL)

## 2017-08-07 ENCOUNTER — Other Ambulatory Visit: Payer: Self-pay | Admitting: Physician Assistant

## 2017-08-07 DIAGNOSIS — E119 Type 2 diabetes mellitus without complications: Secondary | ICD-10-CM

## 2017-08-07 NOTE — Telephone Encounter (Signed)
Last HgA1C was 10.6 on 05/01/17; Does pt need appt before meds can be refilled?

## 2017-08-10 ENCOUNTER — Ambulatory Visit: Payer: BLUE CROSS/BLUE SHIELD | Admitting: Family Medicine

## 2017-08-12 NOTE — Telephone Encounter (Signed)
Pt has scheduled appt on 12/17, can we refill until that date

## 2017-09-01 ENCOUNTER — Telehealth: Payer: Self-pay | Admitting: Physician Assistant

## 2017-09-01 NOTE — Telephone Encounter (Signed)
Copied from [place] [id number]. Topic: Quick Communication - Rx Refill/Question >> Sep 01, 2017 10:30 AM Patrice Paradise wrote: Has the patient contacted their pharmacy? Yes.   metFORMIN (GLUCOPHAGE XR) 500 MG 24 hr tablet   (Agent: If no, request that the patient contact the pharmacy for the refill.)  Preferred Pharmacy (with phone number or street name):  [facility] [address] - [place], [place] - [address], [address] [place] [address] Phone: [phone] Fax: [phone]   Agent: Please be advised that RX refills may take up to 3 business days. We ask that you follow-up with your pharmacy.

## 2017-09-03 ENCOUNTER — Other Ambulatory Visit: Payer: Self-pay | Admitting: *Deleted

## 2017-09-03 DIAGNOSIS — E119 Type 2 diabetes mellitus without complications: Secondary | ICD-10-CM

## 2017-09-03 MED ORDER — METFORMIN HCL ER 500 MG PO TB24: ORAL_TABLET | ORAL | 0 refills | Status: DC

## 2017-09-03 NOTE — Telephone Encounter (Signed)
Prescription refilled. Pt has office visit on 12/17.

## 2017-09-07 ENCOUNTER — Encounter: Payer: Self-pay | Admitting: Family Medicine

## 2017-09-07 ENCOUNTER — Ambulatory Visit: Payer: BLUE CROSS/BLUE SHIELD | Admitting: Family Medicine

## 2017-09-07 VITALS — BP 126/72 | HR 70 | Temp 98.6°F | Resp 16 | Ht 65.63 in | Wt 160.4 lb

## 2017-09-07 DIAGNOSIS — E119 Type 2 diabetes mellitus without complications: Secondary | ICD-10-CM | POA: Diagnosis not present

## 2017-09-07 DIAGNOSIS — F329 Major depressive disorder, single episode, unspecified: Secondary | ICD-10-CM

## 2017-09-07 DIAGNOSIS — Z720 Tobacco use: Secondary | ICD-10-CM

## 2017-09-07 DIAGNOSIS — E785 Hyperlipidemia, unspecified: Secondary | ICD-10-CM

## 2017-09-07 DIAGNOSIS — F419 Anxiety disorder, unspecified: Secondary | ICD-10-CM

## 2017-09-07 DIAGNOSIS — I10 Essential (primary) hypertension: Secondary | ICD-10-CM

## 2017-09-07 DIAGNOSIS — R197 Diarrhea, unspecified: Secondary | ICD-10-CM | POA: Diagnosis not present

## 2017-09-07 DIAGNOSIS — Z598 Other problems related to housing and economic circumstances: Secondary | ICD-10-CM | POA: Diagnosis not present

## 2017-09-07 DIAGNOSIS — Z5181 Encounter for therapeutic drug level monitoring: Secondary | ICD-10-CM | POA: Diagnosis not present

## 2017-09-07 DIAGNOSIS — Z5989 Other problems related to housing and economic circumstances: Secondary | ICD-10-CM

## 2017-09-07 DIAGNOSIS — F32A Depression, unspecified: Secondary | ICD-10-CM

## 2017-09-07 DIAGNOSIS — N8110 Cystocele, unspecified: Secondary | ICD-10-CM | POA: Diagnosis not present

## 2017-09-07 LAB — POCT GLYCOSYLATED HEMOGLOBIN (HGB A1C): Hemoglobin A1C: 8.2

## 2017-09-07 MED ORDER — ATORVASTATIN CALCIUM 40 MG PO TABS: 40.0000 mg | ORAL_TABLET | Freq: Every day | ORAL | 1 refills | Status: DC

## 2017-09-07 MED ORDER — SERTRALINE HCL 50 MG PO TABS: 50.0000 mg | ORAL_TABLET | Freq: Every day | ORAL | 1 refills | Status: DC

## 2017-09-07 MED ORDER — SERTRALINE HCL 50 MG PO TABS: 50.0000 mg | ORAL_TABLET | Freq: Every day | ORAL | 3 refills | Status: DC

## 2017-09-07 MED ORDER — SITAGLIPTIN PHOSPHATE 100 MG PO TABS: 100.0000 mg | ORAL_TABLET | Freq: Every day | ORAL | 1 refills | Status: DC

## 2017-09-07 NOTE — Patient Instructions (Addendum)
We recommend that you schedule a mammogram for breast cancer screening. Typically, you do not need a referral to do this. Please contact a local imaging center to schedule your mammogram.  [facility] - [phone]  *ask for the Radiology Department [facility] [place] [facility] - [phone] or [phone]  [facility] - [phone] [place] [phone] [facility] - [phone]  *ask for the [facility] - [phone]  *ask for the Radiology Department [facility] - [phone]  *ask for the Mammography Department [place] - [phone]    TAKE 1/2 TAB OF THE SERTRALINE DAILY FOR 2 WEEKS, THEN GO UP TO 1 WHOLE TAB - IT WILL TAKE 6-8 WEEKS FOR THIS MEDICINE TO BECOME EFFECTIVE. cONTINUE THE [facility] FOR NOW.  STOP THE SIMVASTATIN - TRY ATORVASTATIN INSTEAD  STOP THE METFORMIN - TRY JANUVIA INSTEAD.     IF you received an x-ray today, you will receive an invoice from [facility]. Please contact [facility] at [phone] with questions or concerns regarding your invoice.   IF you received labwork today, you will receive an invoice from [place]. Please contact [facility] at [phone] with questions or concerns regarding your invoice.   Our billing staff will not be able to assist you with questions regarding bills from these companies.  You will be contacted with the lab results as soon as they are available. The fastest way to get your results is to activate your My Chart account. Instructions are located on the last page of this paperwork. If you have not heard from [place] regarding the results in 2 weeks, please contact this office.

## 2017-09-07 NOTE — Progress Notes (Signed)
Subjective:    Patient ID: Victoria Eaton, female    DOB: [birth year], 56 y.o.   MRN: [id number] Chief Complaint  Patient presents with  . Follow-up    TIIDM    HPI  Victoria Eaton is a 56 yo woman with a PMHx sig for DMII, HTN, MI 2006, and depression/anxiety who presents for a routine 3-4 mo follow-up on her chronic medical conditions. This is my first time meeting Victoria Eaton who was seen by my partner [name] twice in August for the same, but is otherwise new to our practice.  She does not have any explanation as to why she is sched to f/u with me today rather than the provider who asked her to f/u. She also presents with FMLA type forms she wants me to complete. She reports she works 4 jobs and takes care of some family members with severe mental and behavioral health problems so HIGH stress level. She has a lot of diarrhea at baseline but while on metformin became much worse causing her to miss some work and be late rather freq - she has been suspended from one of her jobs. Has HR forms/FMLA for me to complete to give her allowances for unexpectedly late to work when she can't leave the house in the a.m. Due to diarrhea which is improving now that she is off the metformin but never fully resolves.   States when she gets out of the house, then she is find and diarrhea generally does not recur during day as long as she doesn't eat anything.  occassional bowel urgency, frequency, mucopurulent and bloody stools. Was dx with "ulcers" in the early 2000s. Has been on some medication since but she cannot recall what it was. Last colonoscopy was maybe 2016. Was told to follow up in one year but did not follow up. Therefore, was referred to [facility]. We do not have her prior recs from PCP in [facility] and pt unsure what her dx was - exactly why she was taking the Lialda. Stopped metformin 1 week ago. Bowels have improved since then. Has been taking imodium 12-14 tabs/d -  has now been able to decrease to 2 to 4 tabs/day. Has not needed any today.    DMII: Diagnosed 2014.   Lab Results  Component Value Date   HGBA1C 10.6 05/01/2017   CBGs: fasting a.m. 100-150 w/ metformin but have been increasing since she stopped metformin last month(week?) ; after meal  Not checking during day; No hypoglycemic episodes though pt does feel it drop if she doesn't eat.    Not checking cbgs much as out of test strips  Meter type: ? Diet: Doesn't eat right as food triggers bowels so often fasting when she doesn't have ready bathroom access. Exercising: working 4 jobs and carrying for family that has sig behavioral health problems  DM Med Regimen: Prior changes: restarted on glipizide 27m bid and farxiga 151m8/10/18. Add in metformin XR 50059m/29/18.  Stopped metformin 08/22/17 due to diarrhea. Has not been on any other meds that pt is aware of.   eGFR: 62 Baseline Cr: 1.01 Last checked 05/01/17. Microalb: Normal 05/01/17. On acei - lisinopril 40 Lipids:  LDL 86,  non-HDL 122.  Last levels done 05/01/2017. At goal on simvastatin 40. Taking asa 325 qd.  Optho: Seen annually - last exam 10/15/2016 Feet: Monofilament exam done 05/20/2017.   Immunizations:  Influenza: 05/16/17 per pt  Pneumovax-23:  Tobacco use: +  CAD  s/p MI with PCA in 2006: On statin since  Has lost 20-30 lbs since she moved here in October unintentionally. Prolapsed bladder as well.   Has never been on any other antidepressents than the wellbutrin XL. She is anxious quite a bit.  Past Medical History:  Diagnosis Date  . Allergy   . Anxiety   . Arthritis   . Depression   . Diabetes mellitus without complication ([place])   . History of stomach ulcers   . Hypertension   . Myocardial infarction ([place]) 12/31/2004   Past Surgical History:  Procedure Laterality Date  . CORONARY ANGIOPLASTY WITH STENT PLACEMENT  12/30/2004   Patient reported   Current Outpatient Medications on File Prior to Visit    Medication Sig Dispense Refill  . aspirin 325 MG EC tablet Take 1 tablet (325 mg total) by mouth at bedtime. 90 tablet 3  . buPROPion (WELLBUTRIN XL) 150 MG 24 hr tablet TAKE ONE TABLET BY MOUTH ONE TIME DAILY 90 tablet 0  . EPINEPHrine (EPIPEN 2-PAK) 0.3 mg/0.3 mL IJ SOAJ injection Inject 0.3 mLs (0.3 mg total) into the muscle once as needed. 1 Device 1  . FARXIGA 10 MG TABS tablet TAKE ONE TABLET BY MOUTH ONE TIME DAILY 90 tablet 0  . glipiZIDE (GLUCOTROL) 10 MG tablet TAKE ONE TABLET BY MOUTH TWICE A DAY BEFORE A MEAL 180 tablet 0  . hydrochlorothiazide (HYDRODIURIL) 25 MG tablet Take 1 tablet (25 mg total) by mouth daily. 90 tablet 1  . lisinopril (PRINIVIL,ZESTRIL) 40 MG tablet Take 1 tablet (40 mg total) by mouth daily. 90 tablet 1  . loratadine (CLARITIN) 10 MG tablet Take 1 tablet (10 mg total) by mouth daily. 90 tablet 3  . metoprolol tartrate (LOPRESSOR) 25 MG tablet Take 1 tablet (25 mg total) by mouth 2 (two) times daily. 180 tablet 1  . Multiple Vitamin (MULTIVITAMIN WITH MINERALS) TABS tablet Take 1 tablet by mouth daily.    . Omega-3 Fatty Acids (FISH OIL CONCENTRATE) 300 MG CAPS Take 1 capsule by mouth daily.     No current facility-administered medications on file prior to visit.    Allergies  Allergen Reactions  . Kiwi Extract Hives  . Shrimp [Shellfish Allergy] Hives  . Penicillins     Reaction as a child    Family History  Problem Relation Age of Onset  . Heart disease Father   . Hyperlipidemia Father   . Hypertension Father    Social History   Socioeconomic History  . Marital status: Married    Spouse name: None  . Number of children: None  . Years of education: None  . Highest education level: None  Social Needs  . Financial resource strain: None  . Food insecurity - worry: None  . Food insecurity - inability: None  . Transportation needs - medical: None  . Transportation needs - non-medical: None  Occupational History  . None  Tobacco Use  .  Smoking status: Current Every Day Smoker    Packs/day: 0.50    Years: 45.00    Pack years: 22.50    Types: Cigarettes    Start date: 05/01/1972  . Smokeless tobacco: Never Used  Substance and Sexual Activity  . Alcohol use: Yes    Alcohol/week: 12.0 oz    Types: 20 Glasses of wine per week  . Drug use: No  . Sexual activity: No  Other Topics Concern  . None  Social History Narrative  . None   Depression screen PHQ  2/9 05/20/2017 05/01/2017  Decreased Interest 2 3  Down, Depressed, Hopeless 1 1  PHQ - 2 Score 3 4  Altered sleeping 1 0  Tired, decreased energy 2 3  Change in appetite 0 0  Feeling bad or failure about yourself  3 3  Trouble concentrating 1 0  Moving slowly or fidgety/restless 1 0  Suicidal thoughts 0 0  PHQ-9 Score 11 10  Difficult doing work/chores Somewhat difficult -    Review of Systems  Constitutional: Positive for unexpected weight change (pt reports 20-30lbs wt loss in 2 mos).  Gastrointestinal: Positive for blood in stool and diarrhea.  Genitourinary: Positive for difficulty urinating (prolapsed bladder).  Psychiatric/Behavioral: Positive for agitation, decreased concentration and dysphoric mood. The patient is nervous/anxious.    See hpi    Objective:   Physical Exam  Constitutional: She is oriented to person, place, and time. She appears well-developed and well-nourished. No distress.  HENT:  Head: Normocephalic and atraumatic.  Right Ear: External ear normal.  Left Ear: External ear normal.  Eyes: Conjunctivae are normal. No scleral icterus.  Neck: Normal range of motion. Neck supple. No thyromegaly present.  Cardiovascular: Normal rate, regular rhythm, normal heart sounds and intact distal pulses.  Pulmonary/Chest: Effort normal and breath sounds normal. No respiratory distress.  Musculoskeletal: She exhibits no edema.  Lymphadenopathy:    She has no cervical adenopathy.  Neurological: She is alert and oriented to person, place, and time.    Skin: Skin is warm and dry. She is not diaphoretic. No erythema.  Psychiatric: She has a normal mood and affect. Her behavior is normal.      BP 126/72   Pulse 70   Temp 98.6 F (37 C)   Resp 16   Ht 5' 5.63" (1.667 m)   Wt 160 lb 6.4 oz (72.8 kg)   SpO2 96%   BMI 26.18 kg/m       Results for orders placed or performed in visit on 09/07/17  Comprehensive metabolic panel  Result Value Ref Range   Glucose 138 (H) 65 - 99 mg/dL   BUN 18 6 - 24 mg/dL   Creatinine, Ser 1.21 (H) 0.57 - 1.00 mg/dL   GFR calc non Af Amer 49 (L) >59 mL/min/1.73   GFR calc Af Amer 57 (L) >59 mL/min/1.73   BUN/Creatinine Ratio 15 9 - 23   Sodium 141 134 - 144 mmol/L   Potassium 4.2 3.5 - 5.2 mmol/L   Chloride 100 96 - 106 mmol/L   CO2 26 20 - 29 mmol/L   Calcium 9.6 8.7 - 10.2 mg/dL   Total Protein 7.7 6.0 - 8.5 g/dL   Albumin 4.0 3.5 - 5.5 g/dL   Globulin, Total 3.7 1.5 - 4.5 g/dL   Albumin/Globulin Ratio 1.1 (L) 1.2 - 2.2   Bilirubin Total <0.2 0.0 - 1.2 mg/dL   Alkaline Phosphatase 89 39 - 117 IU/L   AST 15 0 - 40 IU/L   ALT 12 0 - 32 IU/L  POCT glycosylated hemoglobin (Hb A1C)  Result Value Ref Range   Hemoglobin A1C 8.2     Assessment & Plan:  NEED TO GET PRIOR VACCINE RECORDS TO DOCUMENT any prior PNEUMOVAX-23 vacc. Or could re-administer at next OV. Need records to document last tdap as well as any prior HIV/Hep C screen as well. Refer for mammogram at next OV. Will also be time to update ophtho exam - likely need referral.  1. Type 2 diabetes mellitus without complication, without long-term  current use of insulin (HCC) - not checking cbgs as needs new test strips but unsure of type.  Could def not tolerate metformin though did give several mo trial- severe exac her chronic diarrhea causing lots of missed work.  Start Januvia 100.  Cont Farxiga 10 qd and glipizide 10 bid. hgba1c was 10.6->8.2 today - keep up the great work  2. Medication monitoring encounter   3. Does not have  health insurance   4. Diarrhea, unspecified type   5.      Tobacco abuse 6.      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  Has only ever been on wellbutrin which could be exacerbating her current anxiety so may need to cons wean down/off depending on her ssri response. Start sertraline 9m x 2 wks, then increase to 536m  7.       Bladder prolapse, female, acquired -dev sxs 2015. [facility] "3rd degree cystocele at rest, 4th degree with valsalva. 2nd degree rectocele with valsalva." and "2nd degree [cervical] prolapse at rest." but no impression/recommendations/plan was given (or even alluded to).  8.       Hyperlipidemia LDL goal <70 with non-HDL goal <100:  LDL 86,  non-HDL 122 4 mos prior on simvastatin 40 - close to goal but not at it so d/c simvastatin 40 and start atorvastatin 40 instead 9.       Essential HTN - very well controlled on hctz 25, lisinopril 40,and metoprolol 25 bid - cont.  Orders Placed This Encounter  Procedures  . Comprehensive metabolic panel  . Ambulatory referral to [facility]    Referral Priority:   Routine    Referral Type:   Consultation    Referral Reason:   Specialty Services Required    Number of Visits Requested:   1  . POCT glycosylated hemoglobin (Hb A1C)    Meds ordered this encounter  Medications  . sitaGLIPtin (JANUVIA) 100 MG tablet    Sig: Take 1 tablet (100 mg total) by mouth daily.    Dispense:  90 tablet    Refill:  1  . atorvastatin (LIPITOR) 40 MG tablet    Sig: Take 1 tablet (40 mg total) by mouth daily.    Dispense:  90 tablet    Refill:  1  . DISCONTD: sertraline (ZOLOFT) 50 MG tablet    Sig: Take 1 tablet (50 mg total) by mouth daily.    Dispense:  30 tablet    Refill:  3  . sertraline  (ZOLOFT) 50 MG tablet    Sig: Take 1 tablet (50 mg total) by mouth daily.    Dispense:  90 tablet    Refill:  1    EvDelman CheadleM.D.  [facility] [phone] [phone]  09/14/17 12:28 AM

## 2017-09-08 LAB — COMPREHENSIVE METABOLIC PANEL
ALBUMIN: 4 g/dL (ref 3.5–5.5)
ALK PHOS: 89 IU/L (ref 39–117)
ALT: 12 IU/L (ref 0–32)
AST: 15 IU/L (ref 0–40)
Albumin/Globulin Ratio: 1.1 — ABNORMAL LOW (ref 1.2–2.2)
BUN/Creatinine Ratio: 15 (ref 9–23)
BUN: 18 mg/dL (ref 6–24)
CO2: 26 mmol/L (ref 20–29)
CREATININE: 1.21 mg/dL — AB (ref 0.57–1.00)
Calcium: 9.6 mg/dL (ref 8.7–10.2)
Chloride: 100 mmol/L (ref 96–106)
GFR, EST AFRICAN AMERICAN: 57 mL/min/{1.73_m2} — AB (ref >59)
GFR, EST NON AFRICAN AMERICAN: 49 mL/min/{1.73_m2} — AB (ref >59)
GLOBULIN, TOTAL: 3.7 g/dL (ref 1.5–4.5)
GLUCOSE: 138 mg/dL — AB (ref 65–99)
POTASSIUM: 4.2 mmol/L (ref 3.5–5.2)
Sodium: 141 mmol/L (ref 134–144)
Total Protein: 7.7 g/dL (ref 6.0–8.5)

## 2017-09-10 ENCOUNTER — Telehealth: Payer: Self-pay

## 2017-09-10 NOTE — Telephone Encounter (Signed)
Called and advised pt that FMLA paperwork was ready to be picked up at 104

## 2017-09-14 ENCOUNTER — Encounter: Payer: Self-pay | Admitting: Family Medicine

## 2017-09-14 DIAGNOSIS — N8110 Cystocele, unspecified: Secondary | ICD-10-CM

## 2017-09-14 DIAGNOSIS — E785 Hyperlipidemia, unspecified: Secondary | ICD-10-CM | POA: Insufficient documentation

## 2017-09-14 HISTORY — DX: Cystocele, unspecified: N81.10

## 2017-09-22 ENCOUNTER — Telehealth: Payer: Self-pay

## 2017-09-22 NOTE — Telephone Encounter (Signed)
Called patient to discuss community resource request received from Dr. Brigitte Pulse. No answer, left contact number for return call.    Victoria Eaton, B.A.  Care Guide - Primary Care at [place]

## 2017-09-30 DIAGNOSIS — Z0271 Encounter for disability determination: Secondary | ICD-10-CM

## 2017-10-01 ENCOUNTER — Other Ambulatory Visit: Payer: Self-pay

## 2017-10-01 ENCOUNTER — Encounter (HOSPITAL_COMMUNITY): Payer: Self-pay | Admitting: *Deleted

## 2017-10-01 DIAGNOSIS — I10 Essential (primary) hypertension: Secondary | ICD-10-CM | POA: Diagnosis present

## 2017-10-01 DIAGNOSIS — R509 Fever, unspecified: Secondary | ICD-10-CM | POA: Diagnosis not present

## 2017-10-01 DIAGNOSIS — N12 Tubulo-interstitial nephritis, not specified as acute or chronic: Principal | ICD-10-CM | POA: Diagnosis present

## 2017-10-01 DIAGNOSIS — Z79899 Other long term (current) drug therapy: Secondary | ICD-10-CM

## 2017-10-01 DIAGNOSIS — M542 Cervicalgia: Secondary | ICD-10-CM | POA: Diagnosis present

## 2017-10-01 DIAGNOSIS — E119 Type 2 diabetes mellitus without complications: Secondary | ICD-10-CM | POA: Diagnosis not present

## 2017-10-01 DIAGNOSIS — F329 Major depressive disorder, single episode, unspecified: Secondary | ICD-10-CM | POA: Diagnosis not present

## 2017-10-01 DIAGNOSIS — E876 Hypokalemia: Secondary | ICD-10-CM | POA: Diagnosis present

## 2017-10-01 DIAGNOSIS — Z880 Allergy status to penicillin: Secondary | ICD-10-CM | POA: Diagnosis not present

## 2017-10-01 DIAGNOSIS — F419 Anxiety disorder, unspecified: Secondary | ICD-10-CM | POA: Diagnosis present

## 2017-10-01 DIAGNOSIS — F17210 Nicotine dependence, cigarettes, uncomplicated: Secondary | ICD-10-CM | POA: Diagnosis present

## 2017-10-01 DIAGNOSIS — E871 Hypo-osmolality and hyponatremia: Secondary | ICD-10-CM | POA: Diagnosis not present

## 2017-10-01 DIAGNOSIS — Z7984 Long term (current) use of oral hypoglycemic drugs: Secondary | ICD-10-CM | POA: Diagnosis not present

## 2017-10-01 DIAGNOSIS — Z7982 Long term (current) use of aspirin: Secondary | ICD-10-CM | POA: Diagnosis not present

## 2017-10-01 DIAGNOSIS — E860 Dehydration: Secondary | ICD-10-CM | POA: Diagnosis present

## 2017-10-01 DIAGNOSIS — Z955 Presence of coronary angioplasty implant and graft: Secondary | ICD-10-CM | POA: Diagnosis not present

## 2017-10-01 DIAGNOSIS — R51 Headache: Secondary | ICD-10-CM | POA: Diagnosis present

## 2017-10-01 DIAGNOSIS — I2510 Atherosclerotic heart disease of native coronary artery without angina pectoris: Secondary | ICD-10-CM | POA: Diagnosis present

## 2017-10-01 DIAGNOSIS — R05 Cough: Secondary | ICD-10-CM | POA: Diagnosis not present

## 2017-10-01 DIAGNOSIS — I252 Old myocardial infarction: Secondary | ICD-10-CM

## 2017-10-01 NOTE — ED Triage Notes (Signed)
Pt noticed a fever yesterday morning with chills. Has been monitoring temp at home with highest temp at 102. Has been taking 1062m tylenol every 6 hours.  Reports nausea, headache, and generalized bodyaches. Has had a nonproductive cough

## 2017-10-02 ENCOUNTER — Other Ambulatory Visit: Payer: Self-pay

## 2017-10-02 ENCOUNTER — Encounter (HOSPITAL_COMMUNITY): Payer: Self-pay | Admitting: Emergency Medicine

## 2017-10-02 ENCOUNTER — Emergency Department (HOSPITAL_COMMUNITY): Payer: BLUE CROSS/BLUE SHIELD

## 2017-10-02 ENCOUNTER — Observation Stay (HOSPITAL_COMMUNITY)
Admission: EM | Admit: 2017-10-02 | Discharge: 2017-10-03 | DRG: 690 | Disposition: A | Discharge status: Home / Self Care | Payer: BLUE CROSS/BLUE SHIELD | Attending: Internal Medicine | Admitting: Internal Medicine

## 2017-10-02 DIAGNOSIS — F17210 Nicotine dependence, cigarettes, uncomplicated: Secondary | ICD-10-CM

## 2017-10-02 DIAGNOSIS — Z79899 Other long term (current) drug therapy: Secondary | ICD-10-CM

## 2017-10-02 DIAGNOSIS — I252 Old myocardial infarction: Secondary | ICD-10-CM

## 2017-10-02 DIAGNOSIS — N12 Tubulo-interstitial nephritis, not specified as acute or chronic: Secondary | ICD-10-CM | POA: Diagnosis present

## 2017-10-02 DIAGNOSIS — Z7982 Long term (current) use of aspirin: Secondary | ICD-10-CM

## 2017-10-02 DIAGNOSIS — R10811 Right upper quadrant abdominal tenderness: Secondary | ICD-10-CM

## 2017-10-02 DIAGNOSIS — I10 Essential (primary) hypertension: Secondary | ICD-10-CM | POA: Diagnosis present

## 2017-10-02 DIAGNOSIS — Z8249 Family history of ischemic heart disease and other diseases of the circulatory system: Secondary | ICD-10-CM

## 2017-10-02 DIAGNOSIS — E119 Type 2 diabetes mellitus without complications: Secondary | ICD-10-CM

## 2017-10-02 DIAGNOSIS — F418 Other specified anxiety disorders: Secondary | ICD-10-CM

## 2017-10-02 DIAGNOSIS — Z8711 Personal history of peptic ulcer disease: Secondary | ICD-10-CM

## 2017-10-02 DIAGNOSIS — E876 Hypokalemia: Secondary | ICD-10-CM

## 2017-10-02 DIAGNOSIS — R05 Cough: Secondary | ICD-10-CM | POA: Diagnosis not present

## 2017-10-02 DIAGNOSIS — N814 Uterovaginal prolapse, unspecified: Secondary | ICD-10-CM

## 2017-10-02 DIAGNOSIS — N390 Urinary tract infection, site not specified: Secondary | ICD-10-CM

## 2017-10-02 DIAGNOSIS — Z87440 Personal history of urinary (tract) infections: Secondary | ICD-10-CM

## 2017-10-02 DIAGNOSIS — Z7984 Long term (current) use of oral hypoglycemic drugs: Secondary | ICD-10-CM

## 2017-10-02 DIAGNOSIS — I2510 Atherosclerotic heart disease of native coronary artery without angina pectoris: Secondary | ICD-10-CM

## 2017-10-02 DIAGNOSIS — R000 Tachycardia, unspecified: Secondary | ICD-10-CM

## 2017-10-02 DIAGNOSIS — E860 Dehydration: Secondary | ICD-10-CM

## 2017-10-02 LAB — URINALYSIS, ROUTINE W REFLEX MICROSCOPIC
BILIRUBIN URINE: NEGATIVE
KETONES UR: 5 mg/dL — AB
NITRITE: POSITIVE — AB
PH: 6 (ref 5.0–8.0)
Protein, ur: 30 mg/dL — AB
Specific Gravity, Urine: 1.015 (ref 1.005–1.030)

## 2017-10-02 LAB — BASIC METABOLIC PANEL
Anion gap: 12 (ref 5–15)
Anion gap: 9 (ref 5–15)
BUN: 17 mg/dL (ref 6–20)
BUN: 19 mg/dL (ref 6–20)
CO2: 22 mmol/L (ref 22–32)
CO2: 22 mmol/L (ref 22–32)
Calcium: 8.2 mg/dL — ABNORMAL LOW (ref 8.9–10.3)
Calcium: 8.2 mg/dL — ABNORMAL LOW (ref 8.9–10.3)
Chloride: 95 mmol/L — ABNORMAL LOW (ref 101–111)
Chloride: 98 mmol/L — ABNORMAL LOW (ref 101–111)
Creatinine, Ser: 1.34 mg/dL — ABNORMAL HIGH (ref 0.44–1.00)
Creatinine, Ser: 1.16 mg/dL — ABNORMAL HIGH (ref 0.44–1.00)
GFR calc Af Amer: 50 mL/min — ABNORMAL LOW (ref >60)
GFR calc Af Amer: 59 mL/min — ABNORMAL LOW (ref >60)
GFR calc non Af Amer: 43 mL/min — ABNORMAL LOW (ref >60)
GFR calc non Af Amer: 51 mL/min — ABNORMAL LOW (ref >60)
Glucose, Bld: 179 mg/dL — ABNORMAL HIGH (ref 65–99)
Glucose, Bld: 172 mg/dL — ABNORMAL HIGH (ref 65–99)
Potassium: 2.8 mmol/L — ABNORMAL LOW (ref 3.5–5.1)
Potassium: 3.6 mmol/L (ref 3.5–5.1)
Sodium: 129 mmol/L — ABNORMAL LOW (ref 135–145)
Sodium: 129 mmol/L — ABNORMAL LOW (ref 135–145)

## 2017-10-02 LAB — MAGNESIUM: Magnesium: 2 mg/dL (ref 1.7–2.4)

## 2017-10-02 LAB — COMPREHENSIVE METABOLIC PANEL
ALT: 21 U/L (ref 14–54)
AST: 23 U/L (ref 15–41)
Albumin: 3.3 g/dL — ABNORMAL LOW (ref 3.5–5.0)
Alkaline Phosphatase: 103 U/L (ref 38–126)
Anion gap: 12 (ref 5–15)
BILIRUBIN TOTAL: 1.2 mg/dL (ref 0.3–1.2)
BUN: 16 mg/dL (ref 6–20)
CO2: 23 mmol/L (ref 22–32)
CREATININE: 1.32 mg/dL — AB (ref 0.44–1.00)
Calcium: 8.8 mg/dL — ABNORMAL LOW (ref 8.9–10.3)
Chloride: 92 mmol/L — ABNORMAL LOW (ref 101–111)
GFR, EST AFRICAN AMERICAN: 50 mL/min — AB (ref >60)
GFR, EST NON AFRICAN AMERICAN: 44 mL/min — AB (ref >60)
Glucose, Bld: 142 mg/dL — ABNORMAL HIGH (ref 65–99)
Potassium: 2.7 mmol/L — CL (ref 3.5–5.1)
Sodium: 127 mmol/L — ABNORMAL LOW (ref 135–145)
TOTAL PROTEIN: 7.4 g/dL (ref 6.5–8.1)

## 2017-10-02 LAB — GLUCOSE, CAPILLARY
Glucose-Capillary: 130 mg/dL — ABNORMAL HIGH (ref 65–99)
Glucose-Capillary: 174 mg/dL — ABNORMAL HIGH (ref 65–99)
Glucose-Capillary: 182 mg/dL — ABNORMAL HIGH (ref 65–99)

## 2017-10-02 LAB — CBC WITH DIFFERENTIAL/PLATELET
BASOS ABS: 0 10*3/uL (ref 0.0–0.1)
Basophils Relative: 0 %
Eosinophils Absolute: 0.1 10*3/uL (ref 0.0–0.7)
Eosinophils Relative: 1 %
HCT: 34.6 % — ABNORMAL LOW (ref 36.0–46.0)
Hemoglobin: 11.4 g/dL — ABNORMAL LOW (ref 12.0–15.0)
LYMPHS ABS: 0.5 10*3/uL — AB (ref 0.7–4.0)
Lymphocytes Relative: 4 %
MCH: 26.9 pg (ref 26.0–34.0)
MCHC: 32.9 g/dL (ref 30.0–36.0)
MCV: 81.6 fL (ref 78.0–100.0)
MONO ABS: 0.5 10*3/uL (ref 0.1–1.0)
Monocytes Relative: 4 %
NEUTROS PCT: 91 %
Neutro Abs: 12.6 10*3/uL — ABNORMAL HIGH (ref 1.7–7.7)
PLATELETS: 178 10*3/uL (ref 150–400)
RBC: 4.24 MIL/uL (ref 3.87–5.11)
RDW: 16.7 % — AB (ref 11.5–15.5)
WBC: 13.7 10*3/uL — AB (ref 4.0–10.5)

## 2017-10-02 LAB — I-STAT BETA HCG BLOOD, ED (MC, WL, AP ONLY): I-stat hCG, quantitative: 6.1 m[IU]/mL — ABNORMAL HIGH (ref <5)

## 2017-10-02 LAB — I-STAT TROPONIN, ED: Troponin i, poc: 0.08 ng/mL (ref 0.00–0.08)

## 2017-10-02 LAB — I-STAT CG4 LACTIC ACID, ED: LACTIC ACID, VENOUS: 1.1 mmol/L (ref 0.5–1.9)

## 2017-10-02 MED ORDER — SODIUM CHLORIDE 0.9% FLUSH
3.0000 mL | Freq: Two times a day (BID) | INTRAVENOUS | Status: DC
  Administered 2017-10-02 (×2): 3 mL via INTRAVENOUS
  Administered 2017-10-03: 10 mL via INTRAVENOUS

## 2017-10-02 MED ORDER — INSULIN ASPART 100 UNIT/ML ~~LOC~~ SOLN
0.0000 [IU] | Freq: Three times a day (TID) | SUBCUTANEOUS | Status: DC
  Administered 2017-10-02: 2 [IU] via SUBCUTANEOUS
  Administered 2017-10-02: 1 [IU] via SUBCUTANEOUS

## 2017-10-02 MED ORDER — POTASSIUM CHLORIDE CRYS ER 20 MEQ PO TBCR
40.0000 meq | EXTENDED_RELEASE_TABLET | Freq: Once | ORAL | Status: AC
  Administered 2017-10-02: 40 meq via ORAL
  Filled 2017-10-02: qty 2

## 2017-10-02 MED ORDER — DEXTROSE 5 % IV SOLN
1.0000 g | INTRAVENOUS | Status: DC
  Administered 2017-10-03: 1 g via INTRAVENOUS
  Filled 2017-10-02: qty 10

## 2017-10-02 MED ORDER — SODIUM CHLORIDE 0.9 % IV BOLUS (SEPSIS)
1000.0000 mL | Freq: Once | INTRAVENOUS | Status: AC
  Administered 2017-10-02: 1000 mL via INTRAVENOUS

## 2017-10-02 MED ORDER — POTASSIUM CHLORIDE CRYS ER 10 MEQ PO TBCR
EXTENDED_RELEASE_TABLET | ORAL | Status: AC
  Filled 2017-10-02: qty 1

## 2017-10-02 MED ORDER — POTASSIUM CHLORIDE 10 MEQ/100ML IV SOLN
10.0000 meq | INTRAVENOUS | Status: AC
  Administered 2017-10-02 (×4): 10 meq via INTRAVENOUS
  Filled 2017-10-02 (×4): qty 100

## 2017-10-02 MED ORDER — ENOXAPARIN SODIUM 40 MG/0.4ML ~~LOC~~ SOLN
40.0000 mg | SUBCUTANEOUS | Status: DC
  Administered 2017-10-02 – 2017-10-03 (×2): 40 mg via SUBCUTANEOUS
  Filled 2017-10-02 (×2): qty 0.4

## 2017-10-02 MED ORDER — PROCHLORPERAZINE EDISYLATE 5 MG/ML IJ SOLN: 10.0000 mg | Freq: Once | INTRAMUSCULAR | Status: DC

## 2017-10-02 MED ORDER — ACETAMINOPHEN 325 MG PO TABS
650.0000 mg | ORAL_TABLET | Freq: Four times a day (QID) | ORAL | Status: DC | PRN
  Administered 2017-10-02 – 2017-10-03 (×3): 650 mg via ORAL
  Filled 2017-10-02 (×2): qty 2

## 2017-10-02 MED ORDER — HYDROCHLOROTHIAZIDE 25 MG PO TABS
25.0000 mg | ORAL_TABLET | Freq: Every day | ORAL | Status: DC
  Administered 2017-10-02: 25 mg via ORAL
  Filled 2017-10-02: qty 1

## 2017-10-02 MED ORDER — LISINOPRIL 40 MG PO TABS
40.0000 mg | ORAL_TABLET | Freq: Every day | ORAL | Status: DC
Start: 2017-10-02 — End: 2017-10-03
  Administered 2017-10-02 – 2017-10-03 (×2): 40 mg via ORAL
  Filled 2017-10-02 (×2): qty 1

## 2017-10-02 MED ORDER — ASPIRIN EC 81 MG PO TBEC
81.0000 mg | DELAYED_RELEASE_TABLET | Freq: Every day | ORAL | Status: DC
  Administered 2017-10-02 – 2017-10-03 (×2): 81 mg via ORAL
  Filled 2017-10-02 (×2): qty 1

## 2017-10-02 MED ORDER — ONDANSETRON HCL 4 MG/2ML IJ SOLN
4.0000 mg | Freq: Once | INTRAMUSCULAR | Status: AC
  Administered 2017-10-02: 4 mg via INTRAVENOUS
  Filled 2017-10-02: qty 2

## 2017-10-02 MED ORDER — MAGNESIUM SULFATE 2 GM/50ML IV SOLN
2.0000 g | Freq: Once | INTRAVENOUS | Status: AC
  Administered 2017-10-02: 2 g via INTRAVENOUS
  Filled 2017-10-02: qty 50

## 2017-10-02 MED ORDER — DEXTROSE 5 % IV SOLN
1.0000 g | Freq: Once | INTRAVENOUS | Status: AC
  Administered 2017-10-02: 1 g via INTRAVENOUS
  Filled 2017-10-02: qty 10

## 2017-10-02 MED ORDER — ATORVASTATIN CALCIUM 40 MG PO TABS
40.0000 mg | ORAL_TABLET | Freq: Every day | ORAL | Status: DC
  Administered 2017-10-02 – 2017-10-03 (×2): 40 mg via ORAL
  Filled 2017-10-02 (×2): qty 1

## 2017-10-02 MED ORDER — SERTRALINE HCL 50 MG PO TABS
50.0000 mg | ORAL_TABLET | Freq: Every day | ORAL | Status: DC
  Administered 2017-10-02 – 2017-10-03 (×2): 50 mg via ORAL
  Filled 2017-10-02 (×2): qty 1

## 2017-10-02 MED ORDER — POTASSIUM CHLORIDE CRYS ER 20 MEQ PO TBCR
80.0000 meq | EXTENDED_RELEASE_TABLET | Freq: Once | ORAL | Status: AC
  Administered 2017-10-02: 80 meq via ORAL
  Filled 2017-10-02: qty 4

## 2017-10-02 MED ORDER — METOPROLOL TARTRATE 25 MG PO TABS
25.0000 mg | ORAL_TABLET | Freq: Two times a day (BID) | ORAL | Status: DC
  Administered 2017-10-02 – 2017-10-03 (×3): 25 mg via ORAL
  Filled 2017-10-02 (×3): qty 1

## 2017-10-02 MED ORDER — BUPROPION HCL ER (XL) 150 MG PO TB24
150.0000 mg | ORAL_TABLET | Freq: Every day | ORAL | Status: DC
  Administered 2017-10-02 – 2017-10-03 (×2): 150 mg via ORAL
  Filled 2017-10-02 (×2): qty 1

## 2017-10-02 MED ORDER — SODIUM CHLORIDE 0.9 % IV SOLN: 250.0000 mL | INTRAVENOUS | Status: DC | PRN

## 2017-10-02 MED ORDER — SODIUM CHLORIDE 0.9% FLUSH: 3.0000 mL | INTRAVENOUS | Status: DC | PRN

## 2017-10-02 MED ORDER — PROCHLORPERAZINE EDISYLATE 5 MG/ML IJ SOLN
5.0000 mg | Freq: Once | INTRAMUSCULAR | Status: DC
  Filled 2017-10-02: qty 2

## 2017-10-02 MED ORDER — ACETAMINOPHEN 325 MG PO TABS
650.0000 mg | ORAL_TABLET | Freq: Once | ORAL | Status: AC
  Administered 2017-10-02: 650 mg via ORAL
  Filled 2017-10-02: qty 2

## 2017-10-02 MED ORDER — POTASSIUM CHLORIDE 10 MEQ/100ML IV SOLN
10.0000 meq | Freq: Once | INTRAVENOUS | Status: AC
  Administered 2017-10-02: 10 meq via INTRAVENOUS
  Filled 2017-10-02: qty 100

## 2017-10-02 NOTE — ED Provider Notes (Signed)
[place] EMERGENCY DEPARTMENT Provider Note   CSN: [id number] Arrival date & time: 10/01/17  2334     History   Chief Complaint Chief Complaint  Patient presents with  . Fever    HPI Victoria Eaton is a 57 y.o. female.  The history is provided by the patient.  Fever   This is a new problem. The current episode started 2 days ago. The problem occurs daily. The problem has been gradually improving. The maximum temperature noted was 103 to 104 F. Associated symptoms include vomiting, headaches and muscle aches. Pertinent negatives include no chest pain, no diarrhea, no sore throat and no cough. She has tried acetaminophen for the symptoms. The treatment provided mild relief.  Patient reports 2 days ago she began having chills, myalgias, fevers She also reports some mild back pain She woke up the following day feeling somewhat improved but later in the day she began having fevers up to 103 No cough or sore throat, reports mild headache No new abdominal pain   Past Medical History:  Diagnosis Date  . Allergy   . Anxiety   . Arthritis   . Bladder prolapse, female, acquired 09/14/2017  . Depression   . Diabetes mellitus without complication ([place])   . History of stomach ulcers   . Hypertension   . Myocardial infarction ([place]) 12/31/2004    Patient Active Problem List   Diagnosis Date Noted  . Hyperlipidemia LDL goal <70 09/14/2017  . Type 2 diabetes mellitus without complication, without long-term current use of insulin ([place]) 05/01/2017  . Essential hypertension 05/01/2017  . Anxiety and depression 05/01/2017  . Tobacco abuse 05/01/2017  . History of MI (myocardial infarction) 05/01/2017  . Allergic rhinitis 05/01/2017  . History of arthritis 05/01/2017  . Bladder prolapse, female, acquired 11/09/2013    Past Surgical History:  Procedure Laterality Date  . CORONARY ANGIOPLASTY WITH STENT PLACEMENT  12/30/2004   Patient reported    OB History      No data available       Home Medications    Prior to Admission medications   Medication Sig Start Date End Date Taking? Authorizing Provider  aspirin 325 MG EC tablet Take 1 tablet (325 mg total) by mouth at bedtime. 05/01/17   Tenna Delaine D, PA-C  atorvastatin (LIPITOR) 40 MG tablet Take 1 tablet (40 mg total) by mouth daily. 09/07/17   Shawnee Knapp, MD  buPROPion (WELLBUTRIN XL) 150 MG 24 hr tablet TAKE ONE TABLET BY MOUTH ONE TIME DAILY 08/06/17   Tenna Delaine D, PA-C  EPINEPHrine (EPIPEN 2-PAK) 0.3 mg/0.3 mL IJ SOAJ injection Inject 0.3 mLs (0.3 mg total) into the muscle once as needed. 05/20/17   Tenna Delaine D, PA-C  FARXIGA 10 MG TABS tablet TAKE ONE TABLET BY MOUTH ONE TIME DAILY 08/06/17   Tenna Delaine D, PA-C  glipiZIDE (GLUCOTROL) 10 MG tablet TAKE ONE TABLET BY MOUTH TWICE A DAY BEFORE A MEAL 08/06/17   Tenna Delaine D, PA-C  hydrochlorothiazide (HYDRODIURIL) 25 MG tablet Take 1 tablet (25 mg total) by mouth daily. 05/01/17   Tenna Delaine D, PA-C  lisinopril (PRINIVIL,ZESTRIL) 40 MG tablet Take 1 tablet (40 mg total) by mouth daily. 05/20/17   Tenna Delaine D, PA-C  loratadine (CLARITIN) 10 MG tablet Take 1 tablet (10 mg total) by mouth daily. 05/01/17   Tenna Delaine D, PA-C  metoprolol tartrate (LOPRESSOR) 25 MG tablet Take 1 tablet (25 mg total) by mouth 2 (two) times daily.  05/01/17   Tenna Delaine D, PA-C  Multiple Vitamin (MULTIVITAMIN WITH MINERALS) TABS tablet Take 1 tablet by mouth daily.    [provider]  Omega-3 Fatty Acids (FISH OIL CONCENTRATE) 300 MG CAPS Take 1 capsule by mouth daily.    [provider]  sertraline (ZOLOFT) 50 MG tablet Take 1 tablet (50 mg total) by mouth daily. 09/07/17   Shawnee Knapp, MD  sitaGLIPtin (JANUVIA) 100 MG tablet Take 1 tablet (100 mg total) by mouth daily. 09/07/17   Shawnee Knapp, MD    Family History Family History  Problem Relation Age of Onset  . Heart disease Father   .  Hyperlipidemia Father   . Hypertension Father     Social History Social History   Tobacco Use  . Smoking status: Current Every Day Smoker    Packs/day: 0.50    Years: 45.00    Pack years: 22.50    Types: Cigarettes    Start date: 05/01/1972  . Smokeless tobacco: Never Used  Substance Use Topics  . Alcohol use: Yes    Alcohol/week: 12.0 oz    Types: 20 Glasses of wine per week  . Drug use: No     Allergies   Kiwi extract; Shrimp [shellfish allergy]; and Penicillins   Review of Systems Review of Systems  Constitutional: Positive for fatigue and fever.  HENT: Negative for sore throat.   Respiratory: Negative for cough.   Cardiovascular: Negative for chest pain.  Gastrointestinal: Positive for vomiting. Negative for diarrhea.  Musculoskeletal: Positive for myalgias.  Skin: Negative for rash.  Neurological: Positive for headaches.  All other systems reviewed and are negative.    Physical Exam Updated Vital Signs BP (!) 149/71   Pulse 92   Temp 98.3 F (36.8 C) (Oral)   Resp 16   SpO2 97%   Physical Exam CONSTITUTIONAL: Well developed/well nourished HEAD: Normocephalic/atraumatic EYES: EOMI/PERRL ENMT: Mucous membranes moist NECK: supple no meningeal signs SPINE/BACK:entire spine nontender CV: S1/S2 noted, no murmurs/rubs/gallops noted LUNGS: Lungs are clear to auscultation bilaterally, no apparent distress ABDOMEN: soft, nontender, no rebound or guarding, bowel sounds noted throughout abdomen GU:no cva tenderness NEURO: Pt is awake/alert/appropriate, moves all extremitiesx4.  No facial droop.   EXTREMITIES: pulses normal/equal, full ROM SKIN: warm, color normal PSYCH: no abnormalities of mood noted, alert and oriented to situation   ED Treatments / Results  Labs (all labs ordered are listed, but only abnormal results are displayed) Labs Reviewed  COMPREHENSIVE METABOLIC PANEL - Abnormal; Notable for the following components:      Result Value    Sodium 127 (*)    Potassium 2.7 (*)    Chloride 92 (*)    Glucose, Bld 142 (*)    Creatinine, Ser 1.32 (*)    Calcium 8.8 (*)    Albumin 3.3 (*)    GFR calc non Af Amer 44 (*)    GFR calc Af Amer 50 (*)    All other components within normal limits  CBC WITH DIFFERENTIAL/PLATELET - Abnormal; Notable for the following components:   WBC 13.7 (*)    Hemoglobin 11.4 (*)    HCT 34.6 (*)    RDW 16.7 (*)    Neutro Abs 12.6 (*)    Lymphs Abs 0.5 (*)    All other components within normal limits  URINALYSIS, ROUTINE W REFLEX MICROSCOPIC - Abnormal; Notable for the following components:   APPearance CLOUDY (*)    Glucose, UA >=500 (*)    Hgb  urine dipstick SMALL (*)    Ketones, ur 5 (*)    Protein, ur 30 (*)    Nitrite POSITIVE (*)    Leukocytes, UA MODERATE (*)    Bacteria, UA MANY (*)    Squamous Epithelial / LPF 6-30 (*)    All other components within normal limits  I-STAT BETA HCG BLOOD, ED ([facility], [facility], AP ONLY) - Abnormal; Notable for the following components:   I-stat hCG, quantitative 6.1 (*)    All other components within normal limits  URINE CULTURE  I-STAT CG4 LACTIC ACID, ED  I-STAT CG4 LACTIC ACID, ED  I-STAT TROPONIN, ED    EKG  EKG Interpretation  Date/Time:  Friday October 02 2017 03:36:57 EST Ventricular Rate:  93 PR Interval:    QRS Duration: 119 QT Interval:  353 QTC Calculation: 439 R Axis:   15 Text Interpretation:  Sinus rhythm LVH with secondary repolarization abnormality No previous ECGs available Abnormal ekg Confirmed by Ripley Fraise [id number]) on 10/02/2017 3:41:03 AM       Radiology Dg Chest 2 View  Result Date: 10/02/2017 CLINICAL DATA:  57 year old female with fever and cough. EXAM: CHEST  2 VIEW COMPARISON:  None. FINDINGS: The lungs are clear. There is no pleural effusion or pneumothorax. The cardiac silhouette is within normal limits. Mild atherosclerotic calcification of the aortic arch. High attenuating content in the right upper abdomen most  likely gallstones Atherosclerotic calcification of the aorta. IMPRESSION: 1. No acute cardiopulmonary process. 2. Probable gallstones. Electronically Signed   By: Anner Crete M.D.   On: 10/02/2017 01:00    Procedures Procedures   Medications Ordered in ED Medications  acetaminophen (TYLENOL) tablet 650 mg (not administered)  potassium chloride SA (K-DUR,KLOR-CON) CR tablet 80 mEq (80 mEq Oral Given 10/02/17 0526)  potassium chloride 10 mEq in 100 mL IVPB (0 mEq Intravenous Stopped 10/02/17 0637)  magnesium sulfate IVPB 2 g 50 mL (0 g Intravenous Stopped 10/02/17 0604)  cefTRIAXone (ROCEPHIN) 1 g in dextrose 5 % 50 mL IVPB (0 g Intravenous Stopped 10/02/17 0603)  sodium chloride 0.9 % bolus 1,000 mL (0 mLs Intravenous Stopped 10/02/17 0637)  ondansetron (ZOFRAN) injection 4 mg (4 mg Intravenous Given 10/02/17 0706)     Initial Impression / Assessment and Plan / ED Course  I have reviewed the triage vital signs and the nursing notes.  Pertinent labs & imaging results that were available during my care of the patient were reviewed by me and considered in my medical decision making (see chart for details).     4:06 AM Patient found to have pyelonephritis. Patient reports history of uterine prolapse, which could predispose her to UTI She is also noted to have hypokalemia which we will replenish Patient is not septic at this time 7:26 AM After receiving IV antibiotics and IV potassium, patient began to worsen She is now febrile and tachycardic, BP is normal She is now feeling nauseous She reports abdominal discomfort, but there is no focal abdominal tenderness No chest pain or shortness of breath is reported  Since patient is worsening, with known history of pyelonephritis and hypokalemia so she would need to be admitted and she is likely would not improve at home  8:00 AM Discussed with internal medicine service for admission  Final Clinical Impressions(s) / ED Diagnoses   Final  diagnoses:  Pyelonephritis  Hypokalemia  Dehydration    ED Discharge Orders    None       Ripley Fraise, MD 10/02/17 0800

## 2017-10-02 NOTE — ED Notes (Signed)
Meal tray ordered for the patient

## 2017-10-02 NOTE — ED Notes (Signed)
Attempted report 

## 2017-10-02 NOTE — Progress Notes (Signed)
Daughter, Victoria Eaton, requests call once patient is moved to floor. Number in demographics tab.

## 2017-10-02 NOTE — H&P (Signed)
Date: 10/02/2017               Patient Name:  Victoria Eaton MRN: [id number]  DOB: 1961-08-01 Age / Sex: 57 y.o., female   PCP: Leonie Douglas, PA-C         Medical Service: Internal Medicine Teaching Service         Attending Physician: Dr. Rebeca Alert Raynaldo Opitz, MD    First Contact: Dr. Tarri Abernethy Pager: [phone]  Second Contact: Dr. Danford Bad Pager: [phone]       After Hours (After 5p/  First Contact Pager: [phone]  weekends / holidays): Second Contact Pager: [phone]   Chief Complaint: Fevers  History of Present Illness:  Ms. Gasiorowski is a 57 year old female with a past medical history significant for hypertension, diabetes mellitus type 2, uterine prolapse, stomach ulcers, myocardial infarction, anxiety/depression presenting with complaints of fevers and chills. She reports that two days ago she develops acute onset of fevers and chills with myalgias. She notes that she took her temperature at home and it was 103F. She reports that she subsequently developed frontal headaches and neck/bilateral shoulder pain. Does report nausea with occasional emesis. She denies any cough, shortness of breath, chest pain, abdominal pain, dysuria, urinary frequency but does note some urinary urgency.   In the ED, her UA was consistent with urinary tract infection. Initial vitals were reassuring, however, she subsequently became febrile to 102 (per EDP report, cannot find documentation of fever) and tachycardic with nausea and vomiting. Labs were remarkable for mild leukocytosis of 13.7 with left shift as well as significantl hypokalemia of 2.7 and hyponatremia of 127.   Meds:  Current Meds  Medication Sig  . aspirin 325 MG EC tablet Take 1 tablet (325 mg total) by mouth at bedtime.  [name] atorvastatin (LIPITOR) 40 MG tablet Take 1 tablet (40 mg total) by mouth daily.  [name] buPROPion (WELLBUTRIN XL) 150 MG 24 hr tablet TAKE ONE TABLET BY MOUTH ONE TIME DAILY  . EPINEPHrine (EPIPEN 2-PAK) 0.3 mg/0.3 mL IJ  SOAJ injection Inject 0.3 mLs (0.3 mg total) into the muscle once as needed. (Patient taking differently: Inject 0.3 mg into the muscle as needed (allergic reaction). )  . FARXIGA 10 MG TABS tablet TAKE ONE TABLET BY MOUTH ONE TIME DAILY  . glipiZIDE (GLUCOTROL) 10 MG tablet TAKE ONE TABLET BY MOUTH TWICE A DAY BEFORE A MEAL  . hydrochlorothiazide (HYDRODIURIL) 25 MG tablet Take 1 tablet (25 mg total) by mouth daily.  [name] lisinopril (PRINIVIL,ZESTRIL) 40 MG tablet Take 1 tablet (40 mg total) by mouth daily.  [name] loratadine (CLARITIN) 10 MG tablet Take 1 tablet (10 mg total) by mouth daily.  . metoprolol tartrate (LOPRESSOR) 25 MG tablet Take 1 tablet (25 mg total) by mouth 2 (two) times daily.  . Multiple Vitamin (MULTIVITAMIN WITH MINERALS) TABS tablet Take 1 tablet by mouth daily.  . Omega-3 Fatty Acids (FISH OIL CONCENTRATE) 300 MG CAPS Take 1 capsule by mouth daily.  . sertraline (ZOLOFT) 50 MG tablet Take 1 tablet (50 mg total) by mouth daily.  . sitaGLIPtin (JANUVIA) 100 MG tablet Take 1 tablet (100 mg total) by mouth daily.     Allergies: Allergies as of 10/01/2017 - Review Complete 10/01/2017  Allergen Reaction Noted  . Kiwi extract Hives 05/01/2017  . Shrimp [shellfish allergy] Hives 05/01/2017  . Penicillins  05/01/2017   Past Medical History:  Diagnosis Date  . Allergy   . Anxiety   . Arthritis   .  Bladder prolapse, female, acquired 09/14/2017  . Depression   . Diabetes mellitus without complication ([place])   . History of stomach ulcers   . Hypertension   . Myocardial infarction ([place]) 12/31/2004    Family History:  Family History  Problem Relation Age of Onset  . Heart disease Father   . Hyperlipidemia Father   . Hypertension Father    Social History:  Social History   Socioeconomic History  . Marital status: Married    Spouse name: None  . Number of children: None  . Years of education: None  . Highest education level: None  Social Needs  . Financial resource  strain: None  . Food insecurity - worry: None  . Food insecurity - inability: None  . Transportation needs - medical: None  . Transportation needs - non-medical: None  Occupational History  . None  Tobacco Use  . Smoking status: Current Every Day Smoker    Packs/day: 0.50    Years: 45.00    Pack years: 22.50    Types: Cigarettes    Start date: 05/01/1972  . Smokeless tobacco: Never Used  Substance and Sexual Activity  . Alcohol use: Yes    Alcohol/week: 12.0 oz    Types: 20 Glasses of wine per week  . Drug use: No  . Sexual activity: No  Other Topics Concern  . None  Social History Narrative  . None   Review of Systems: A complete ROS was negative except as per HPI.   Physical Exam: Blood pressure 122/61, pulse 95, temperature 98.9 F (37.2 C), temperature source Oral, resp. rate 18, SpO2 95 %. General: alert, well-developed, and cooperative to examination.  Head: normocephalic and atraumatic.  Eyes: vision grossly intact, pupils equal, pupils round, pupils reactive to light, no injection and anicteric.  Mouth: pharynx pink and dry, no erythema, and no exudates.  Neck: supple, full ROM, no thyromegaly, no JVD, and no carotid bruits.  Lungs: normal respiratory effort, no accessory muscle use, normal breath sounds, no crackles, and no wheezes. Heart: tachycardic, regular rhythm, no murmur, no gallop, and no rub.  Abdomen: soft, RUQ tenderness, normal bowel sounds, no distention, no guarding, no rebound tenderness Msk: no joint swelling, no joint warmth, and no redness over joints. Mild right CVA tenderness.  Pulses: 2+ DP/PT pulses bilaterally Extremities: No cyanosis, clubbing, edema Neurologic: alert & oriented X3, no focal deficits, no meningismus present Skin: turgor normal and no rashes.  Psych: normal mood and affect  EKG: personally reviewed my interpretation is NSR  CXR: personally reviewed my interpretation is no acute cardiopulmonary process  Assessment &  Plan by Problem:  Pyelonephritis:  Patient with 2 day history of fevers, chills with associated nausea and emesis. Reportedly febrile to 102 in the ED though cannot find this documented. She notes fevers to 103 at home. UA obtained in the ED shows leukocytes, bacteria and nitrite positive. She has minimal urinary symptoms and only notes some urinary urgency. She reports UTIs in the past but states this does not feel like her UTIs previously. Urine culture sent. She was started on ceftriaxone in the ED. UA also notable for glucose >500. She does have some mild right CVA tenderness. Exam also notable for RUQ pain. Does appear she has a history of gallstones seen on previous imaging and noted likely present on her CXR. However, labs not suggestive of any biliary process. She also has complaints of headache and neck pain however no meningeal signs present today. Given the degree  of her symptoms and systemic signs with UA primary concern if for pyelonephritis. Will continue ceftriaxone started in the ED and follow up urine cultures.   Hypokalemia: K 2.7 on arrival to the ED. Received PO replacement as well as 1 run of IV KCl and magnesium. EKG without any abnormalities. She has no complaints of chest pain or palpitations. Will re-check labs this afternoon and supplement as needed. Continue telemetry monitoring until improved.   Hyponatremia: Na 127 on arrival. Will re-check this afternoon. If persistently low despite IVF received in the ED, will pursue further work up.   DM: On glipizide and sitagliptin at home.Will place on SSI-s here.   CAD:  Continue home Lipitor and asa.   HTN: Continue home lisinopril, metoprolol, HCTZ   Dispo: Admit patient to Inpatient with expected length of stay greater than 2 midnights.  Signed: Maryellen Pile, MD 10/02/2017, 11:53 AM  Pager: [phone]

## 2017-10-02 NOTE — ED Notes (Signed)
Dr. Stark Jock notified on pt.'s hypokalemia = 2.7 .

## 2017-10-03 DIAGNOSIS — N10 Acute tubulo-interstitial nephritis: Secondary | ICD-10-CM

## 2017-10-03 DIAGNOSIS — E871 Hypo-osmolality and hyponatremia: Secondary | ICD-10-CM

## 2017-10-03 DIAGNOSIS — E876 Hypokalemia: Secondary | ICD-10-CM

## 2017-10-03 DIAGNOSIS — Z880 Allergy status to penicillin: Secondary | ICD-10-CM

## 2017-10-03 DIAGNOSIS — Z91013 Allergy to seafood: Secondary | ICD-10-CM

## 2017-10-03 DIAGNOSIS — Z91018 Allergy to other foods: Secondary | ICD-10-CM

## 2017-10-03 DIAGNOSIS — I10 Essential (primary) hypertension: Secondary | ICD-10-CM

## 2017-10-03 LAB — URINE CULTURE: Culture: <10000 — AB

## 2017-10-03 LAB — HIV ANTIBODY (ROUTINE TESTING W REFLEX): HIV Screen 4th Generation wRfx: NONREACTIVE

## 2017-10-03 LAB — BASIC METABOLIC PANEL
Anion gap: 9 (ref 5–15)
BUN: 16 mg/dL (ref 6–20)
CO2: 20 mmol/L — ABNORMAL LOW (ref 22–32)
Calcium: 8 mg/dL — ABNORMAL LOW (ref 8.9–10.3)
Chloride: 99 mmol/L — ABNORMAL LOW (ref 101–111)
Creatinine, Ser: 1 mg/dL (ref 0.44–1.00)
GFR calc Af Amer: >60 mL/min (ref >60)
GFR calc non Af Amer: >60 mL/min (ref >60)
Glucose, Bld: 147 mg/dL — ABNORMAL HIGH (ref 65–99)
Potassium: 3.7 mmol/L (ref 3.5–5.1)
Sodium: 128 mmol/L — ABNORMAL LOW (ref 135–145)

## 2017-10-03 LAB — GLUCOSE, CAPILLARY
Glucose-Capillary: 134 mg/dL — ABNORMAL HIGH (ref 65–99)
Glucose-Capillary: 148 mg/dL — ABNORMAL HIGH (ref 65–99)
Glucose-Capillary: 180 mg/dL — ABNORMAL HIGH (ref 65–99)

## 2017-10-03 LAB — CBC
HCT: 29.1 % — ABNORMAL LOW (ref 36.0–46.0)
Hemoglobin: 9.4 g/dL — ABNORMAL LOW (ref 12.0–15.0)
MCH: 25.8 pg — ABNORMAL LOW (ref 26.0–34.0)
MCHC: 32.3 g/dL (ref 30.0–36.0)
MCV: 79.9 fL (ref 78.0–100.0)
Platelets: 123 10*3/uL — ABNORMAL LOW (ref 150–400)
RBC: 3.64 MIL/uL — ABNORMAL LOW (ref 3.87–5.11)
RDW: 16.4 % — ABNORMAL HIGH (ref 11.5–15.5)
WBC: 4.4 10*3/uL (ref 4.0–10.5)

## 2017-10-03 MED ORDER — CEFDINIR 300 MG PO CAPS: 300.0000 mg | ORAL_CAPSULE | Freq: Two times a day (BID) | ORAL | 0 refills | Status: AC

## 2017-10-03 MED ORDER — AMLODIPINE BESYLATE 5 MG PO TABS: 5.0000 mg | ORAL_TABLET | Freq: Every day | ORAL | 0 refills | Status: DC

## 2017-10-03 MED ORDER — AMLODIPINE BESYLATE 5 MG PO TABS
5.0000 mg | ORAL_TABLET | Freq: Every day | ORAL | Status: DC
  Administered 2017-10-03: 5 mg via ORAL
  Filled 2017-10-03: qty 1

## 2017-10-03 MED ORDER — INSULIN ASPART 100 UNIT/ML ~~LOC~~ SOLN
0.0000 [IU] | Freq: Three times a day (TID) | SUBCUTANEOUS | Status: DC
  Administered 2017-10-03: 2 [IU] via SUBCUTANEOUS
  Administered 2017-10-03: 3 [IU] via SUBCUTANEOUS
  Administered 2017-10-03: 2 [IU] via SUBCUTANEOUS

## 2017-10-03 NOTE — Progress Notes (Signed)
Patient with temp of 101.4,tylenol given.Patient with no c/o. MD on call notified. No new orders received. Sutton Hirsch, Wonda Cheng, Therapist, sports

## 2017-10-03 NOTE — Discharge Instructions (Signed)
Thank you for allowing [place] to provide your care. Please follow-up with your primary care physician as soon as possible. Please STOP taking your Hydrochlorothiazide and pick up your antibiotics and new blood pressure medicine (Amlodipine) from your pharmacy.   Contact a doctor if:  You do not feel better after 2 days.  Your symptoms get worse.  You have a fever. Get help right away if:  You cannot take your medicine or drink fluids as told.  You have chills and shaking.  You throw up (vomit).  You have very bad pain in your side (flank) or back.  You feel very weak or you pass out (faint).  Pyelonephritis, Adult Pyelonephritis is a kidney infection. The kidneys are organs that help clean your blood by moving waste out of your blood and into your pee (urine). This infection can happen quickly, or it can last for a long time. In most cases, it clears up with treatment and does not cause other problems. Follow these instructions at home: Medicines  Take over-the-counter and prescription medicines only as told by your doctor.  Take your antibiotic medicine as told by your doctor. Do not stop taking the medicine even if you start to feel better. General instructions  Drink enough fluid to keep your pee clear or pale yellow.  Avoid caffeine, tea, and carbonated drinks.  Pee (urinate) often. Avoid holding in pee for long periods of time.  Pee before and after sex.  After pooping (having a bowel movement), women should wipe from front to back. Use each tissue only once.  Keep all follow-up visits as told by your doctor. This is important. Contact a doctor if:  You do not feel better after 2 days.  Your symptoms get worse.  You have a fever. Get help right away if:  You cannot take your medicine or drink fluids as told.  You have chills and shaking.  You throw up (vomit).  You have very bad pain in your side (flank) or back.  You feel very weak or you pass out  (faint). This information is not intended to replace advice given to you by your health care provider. Make sure you discuss any questions you have with your health care provider. Document Released: 10/16/2004 Document Revised: 02/14/2016 Document Reviewed: 01/01/2015 Elsevier Interactive Patient Education  [employer].

## 2017-10-03 NOTE — Discharge Summary (Signed)
Name: Victoria Eaton MRN: [id number] DOB: 1961/08/18 57 y.o. PCP: Leonie Douglas, PA-C  Date of Admission: 10/02/2017  2:36 AM Date of Discharge:  Attending Physician: Victoria Kilts, MD  Discharge Diagnosis: 1. Pyelonephritis 2. Hyponatremia with hypokalemia 3. Hypertension   Principal Problem:   Pyelonephritis Active Problems:   Type 2 diabetes mellitus without complication, without long-term current use of insulin (HCC)   Essential hypertension  Discharge Medications: Allergies as of 10/03/2017      Reactions   Kiwi Extract Hives   Shrimp [shellfish Allergy] Hives   Penicillins    Reaction as a child      Medication List    STOP taking these medications   hydrochlorothiazide 25 MG tablet Commonly known as:  HYDRODIURIL     TAKE these medications   amLODipine 5 MG tablet Commonly known as:  NORVASC Take 1 tablet (5 mg total) by mouth daily. Start taking on:  10/04/2017   aspirin 325 MG EC tablet Take 1 tablet (325 mg total) by mouth at bedtime.   atorvastatin 40 MG tablet Commonly known as:  LIPITOR Take 1 tablet (40 mg total) by mouth daily.   buPROPion 150 MG 24 hr tablet Commonly known as:  WELLBUTRIN XL TAKE ONE TABLET BY MOUTH ONE TIME DAILY   cefdinir 300 MG capsule Commonly known as:  OMNICEF Take 1 capsule (300 mg total) by mouth 2 (two) times daily for 5 days.   EPINEPHrine 0.3 mg/0.3 mL Soaj injection Commonly known as:  EPIPEN 2-PAK Inject 0.3 mLs (0.3 mg total) into the muscle once as needed. What changed:    when to take this  reasons to take this   FARXIGA 10 MG Tabs tablet Generic drug:  dapagliflozin propanediol TAKE ONE TABLET BY MOUTH ONE TIME DAILY   FISH OIL CONCENTRATE 300 MG Caps Take 1 capsule by mouth daily.   glipiZIDE 10 MG tablet Commonly known as:  GLUCOTROL TAKE ONE TABLET BY MOUTH TWICE A DAY BEFORE A MEAL   lisinopril 40 MG tablet Commonly known as:  PRINIVIL,ZESTRIL Take 1 tablet (40 mg total) by  mouth daily.   loratadine 10 MG tablet Commonly known as:  CLARITIN Take 1 tablet (10 mg total) by mouth daily.   metoprolol tartrate 25 MG tablet Commonly known as:  LOPRESSOR Take 1 tablet (25 mg total) by mouth 2 (two) times daily.   multivitamin with minerals Tabs tablet Take 1 tablet by mouth daily.   sertraline 50 MG tablet Commonly known as:  ZOLOFT Take 1 tablet (50 mg total) by mouth daily.   sitaGLIPtin 100 MG tablet Commonly known as:  JANUVIA Take 1 tablet (100 mg total) by mouth daily.     Disposition and follow-up:   [name] was discharged from [facility] in Stable condition.  At the hospital follow up visit please address:  1.  Pyelonephritis.  Please discussed with the patient was able to obtain her Cefdinir and complete her course without any issues.  Hyponatremia with hypokalemia.  Felt to be secondary to hydrochlorothiazide use and emesis. Please repeat a BMP. Hypertension. Please assess how she is doing on the Amlodipine.  2.  Labs / imaging needed at time of follow-up: BMP  3.  Pending labs/ test needing follow-up: None  Follow-up Appointments: Follow-up Information    Leonie Douglas, PA-C. Schedule an appointment as soon as possible for a visit.   Specialties:  Librarian, academic, Family Medicine Contact information: [place]  [place] [address] [phone]         Hospital Course by problem list: Principal Problem:   Pyelonephritis Active Problems:   Type 2 diabetes mellitus without complication, without long-term current use of insulin (HCC)   Essential hypertension   Pyelonephritis.  Victoria Eaton is a 57 year old female who presented to the ED with signs and symptoms consistent with pyelonephritis. She was subsequently started on ceftriaxone. Unfortunately her urine cultures were not obtained until approximately 4 hours after she had received her first dose of ceftriaxone. Therefore her urine  cultures grew less than 10,000 colonies. I suspect that these cultures are likely inaccurate. The patient appears to have responded well to ceftriaxone, and was able to tolerate p.o. intake and ambulate without difficulty. Her leukocytosis resolved. She was discharged in stable condition on Cefdinir 300 mg twice daily for an additional 5 days. She was given return precautions. She will follow-up with her PCP as soon as possible.  Hyponatremia with hypokalemia. Patient presented with hyponatremia and hypokalemia. Initially it was thought that this may be to to her emesis however her bicarb was noted to be in normal range. Therefore it is more likely related to her hydrochlorothiazide use. Her hydrochlorothiazide was stopped and she was transitioned to amlodipine for blood pressure control. Potassium was replaced. Please repeat a BMP at her next office visit to ensure her hyponatremia and hypokalemia have resolved.  Hypertension. Patient's hydrochlorothiazide was stopped due to hyponatremia and hypokalemia. She was started on amlodipine. Please assess her blood pressure control and repeat a BMP at her next office visit.  Discharge Vitals:   BP (!) 164/71 (BP Location: Right Arm)   Pulse 75   Temp 98.6 F (37 C) (Oral)   Resp 16   Ht 5' 6"  (1.676 m)   Wt 163 lb 9.3 oz (74.2 kg)   SpO2 99%   BMI 26.40 kg/m   Discharge Instructions: Discharge Instructions    Call MD for:  persistant nausea and vomiting   Complete by:  As directed    Call MD for:  temperature >100.4   Complete by:  As directed    Diet - low sodium heart healthy   Complete by:  As directed    Increase activity slowly   Complete by:  As directed      Signed: Ina Homes, MD 10/03/2017, 12:41 PM   My Pager: [phone]

## 2017-10-03 NOTE — Progress Notes (Signed)
   Subjective: Patient doing well this morning.  She was able to tolerate p.o. intake and ambulate without difficulty.  Her abdominal pain has improved significantly and her headache is beginning to improve.  We discussed waiting on culture results and then discharging on oral antibiotic therapy with return precautions.  She agrees with the plan.  All questions and concerns addressed.  Objective: Vital signs in last 24 hours: Vitals:   10/02/17 1125 10/02/17 1600 10/02/17 2059 10/03/17 0519  BP: 122/61  (!) 112/59 138/74  Pulse: 95  81 96  Resp: 18  18 18   Temp: 98.9 F (37.2 C)  99.6 F (37.6 C) (!) 101.4 F (38.6 C)  TempSrc: Oral  Oral Oral  SpO2: 95%  95% 95%  Weight:  163 lb 5.8 oz (74.1 kg) 163 lb 9.3 oz (74.2 kg)   Height:  5' 6"  (1.676 m)     General: Well-nourished female in no acute distress Pulm: Good air movement with no wheezing or crackles CV: Regular rate and rhythm no murmurs or rubs Abdomen: Soft, nondistended, no tenderness to palpation Extremities: No lower extremity edema  Assessment/Plan:  Victoria Eaton is a 57 year old female who presented to the ED with signs and symptoms consistent with pyelonephritis. Treatment with ceftriaxone has been initiated and urine cultures are pending.  Pyelonephritis - Patient's abdominal pain is improving - Patient was febrile this morning. - Urine cultures showing less than 10,000 colonies.  However, urine cultures were obtained approximately 4 hours after antibiotics were initiated.  Therefore urine cultures are likely inaccurate. - We will transition to oral Cefdinir 300 mg twice daily for a total of 7 days.  Hyponatremia - Patient presented with hypokalemia and hyponatremia.  This is a classic finding of hydrochlorothiazide side effect.  We will stop her hydrochlorothiazide - Continue to monitor  Diabetes mellitus - Previous A1c in December of 8.2 - Sliding scale insulin-moderate  CAD - Continue atorvastatin and  aspirin  Hypertension - Continue lisinopril and metoprolol. - Stop the patient's hydrochlorothiazide - We will start her amlodipine 5 mg daily  Hypokalemia. Resolved  Dispo: Anticipated discharge in approximately 0 day(s).   Ina Homes, MD 10/03/2017, 6:16 AM My Pager: [phone]

## 2017-10-10 ENCOUNTER — Encounter (HOSPITAL_COMMUNITY): Payer: Self-pay | Admitting: *Deleted

## 2017-10-10 ENCOUNTER — Emergency Department (HOSPITAL_COMMUNITY): Payer: BLUE CROSS/BLUE SHIELD

## 2017-10-10 ENCOUNTER — Inpatient Hospital Stay (HOSPITAL_COMMUNITY)
Admission: EM | Admit: 2017-10-10 | Discharge: 2017-10-25 | DRG: 233 | Disposition: A | Discharge status: Home / Self Care | Payer: BLUE CROSS/BLUE SHIELD | Attending: Cardiothoracic Surgery | Admitting: Cardiothoracic Surgery

## 2017-10-10 ENCOUNTER — Other Ambulatory Visit: Payer: Self-pay

## 2017-10-10 DIAGNOSIS — I21A1 Myocardial infarction type 2: Secondary | ICD-10-CM | POA: Diagnosis not present

## 2017-10-10 DIAGNOSIS — N136 Pyonephrosis: Secondary | ICD-10-CM | POA: Diagnosis not present

## 2017-10-10 DIAGNOSIS — K859 Acute pancreatitis without necrosis or infection, unspecified: Secondary | ICD-10-CM | POA: Diagnosis present

## 2017-10-10 DIAGNOSIS — N8110 Cystocele, unspecified: Secondary | ICD-10-CM | POA: Diagnosis present

## 2017-10-10 DIAGNOSIS — I248 Other forms of acute ischemic heart disease: Secondary | ICD-10-CM | POA: Diagnosis present

## 2017-10-10 DIAGNOSIS — I25110 Atherosclerotic heart disease of native coronary artery with unstable angina pectoris: Principal | ICD-10-CM | POA: Diagnosis present

## 2017-10-10 DIAGNOSIS — R7989 Other specified abnormal findings of blood chemistry: Secondary | ICD-10-CM

## 2017-10-10 DIAGNOSIS — F329 Major depressive disorder, single episode, unspecified: Secondary | ICD-10-CM | POA: Diagnosis present

## 2017-10-10 DIAGNOSIS — J810 Acute pulmonary edema: Secondary | ICD-10-CM | POA: Diagnosis not present

## 2017-10-10 DIAGNOSIS — Z09 Encounter for follow-up examination after completed treatment for conditions other than malignant neoplasm: Secondary | ICD-10-CM

## 2017-10-10 DIAGNOSIS — Z8249 Family history of ischemic heart disease and other diseases of the circulatory system: Secondary | ICD-10-CM

## 2017-10-10 DIAGNOSIS — Z955 Presence of coronary angioplasty implant and graft: Secondary | ICD-10-CM

## 2017-10-10 DIAGNOSIS — I2510 Atherosclerotic heart disease of native coronary artery without angina pectoris: Secondary | ICD-10-CM

## 2017-10-10 DIAGNOSIS — R9431 Abnormal electrocardiogram [ECG] [EKG]: Secondary | ICD-10-CM

## 2017-10-10 DIAGNOSIS — I10 Essential (primary) hypertension: Secondary | ICD-10-CM | POA: Diagnosis present

## 2017-10-10 DIAGNOSIS — Z8711 Personal history of peptic ulcer disease: Secondary | ICD-10-CM

## 2017-10-10 DIAGNOSIS — Z880 Allergy status to penicillin: Secondary | ICD-10-CM | POA: Diagnosis not present

## 2017-10-10 DIAGNOSIS — E119 Type 2 diabetes mellitus without complications: Secondary | ICD-10-CM | POA: Diagnosis not present

## 2017-10-10 DIAGNOSIS — K8020 Calculus of gallbladder without cholecystitis without obstruction: Secondary | ICD-10-CM | POA: Diagnosis not present

## 2017-10-10 DIAGNOSIS — R748 Abnormal levels of other serum enzymes: Secondary | ICD-10-CM | POA: Diagnosis not present

## 2017-10-10 DIAGNOSIS — Z79899 Other long term (current) drug therapy: Secondary | ICD-10-CM | POA: Diagnosis not present

## 2017-10-10 DIAGNOSIS — R110 Nausea: Secondary | ICD-10-CM | POA: Diagnosis not present

## 2017-10-10 DIAGNOSIS — E876 Hypokalemia: Secondary | ICD-10-CM | POA: Diagnosis not present

## 2017-10-10 DIAGNOSIS — E785 Hyperlipidemia, unspecified: Secondary | ICD-10-CM | POA: Diagnosis present

## 2017-10-10 DIAGNOSIS — I25810 Atherosclerosis of coronary artery bypass graft(s) without angina pectoris: Secondary | ICD-10-CM | POA: Diagnosis not present

## 2017-10-10 DIAGNOSIS — K8010 Calculus of gallbladder with chronic cholecystitis without obstruction: Secondary | ICD-10-CM | POA: Diagnosis not present

## 2017-10-10 DIAGNOSIS — Z951 Presence of aortocoronary bypass graft: Secondary | ICD-10-CM

## 2017-10-10 DIAGNOSIS — R1013 Epigastric pain: Secondary | ICD-10-CM

## 2017-10-10 DIAGNOSIS — D62 Acute posthemorrhagic anemia: Secondary | ICD-10-CM | POA: Diagnosis not present

## 2017-10-10 DIAGNOSIS — K8070 Calculus of gallbladder and bile duct without cholecystitis without obstruction: Secondary | ICD-10-CM | POA: Diagnosis present

## 2017-10-10 DIAGNOSIS — F419 Anxiety disorder, unspecified: Secondary | ICD-10-CM | POA: Diagnosis not present

## 2017-10-10 DIAGNOSIS — Z91018 Allergy to other foods: Secondary | ICD-10-CM

## 2017-10-10 DIAGNOSIS — R51 Headache: Secondary | ICD-10-CM | POA: Diagnosis not present

## 2017-10-10 DIAGNOSIS — R079 Chest pain, unspecified: Secondary | ICD-10-CM | POA: Diagnosis not present

## 2017-10-10 DIAGNOSIS — Z7984 Long term (current) use of oral hypoglycemic drugs: Secondary | ICD-10-CM | POA: Diagnosis not present

## 2017-10-10 DIAGNOSIS — F17210 Nicotine dependence, cigarettes, uncomplicated: Secondary | ICD-10-CM | POA: Diagnosis present

## 2017-10-10 DIAGNOSIS — J90 Pleural effusion, not elsewhere classified: Secondary | ICD-10-CM | POA: Diagnosis not present

## 2017-10-10 DIAGNOSIS — I255 Ischemic cardiomyopathy: Secondary | ICD-10-CM | POA: Diagnosis present

## 2017-10-10 DIAGNOSIS — E8770 Fluid overload, unspecified: Secondary | ICD-10-CM | POA: Diagnosis not present

## 2017-10-10 DIAGNOSIS — R778 Other specified abnormalities of plasma proteins: Secondary | ICD-10-CM

## 2017-10-10 DIAGNOSIS — Z91013 Allergy to seafood: Secondary | ICD-10-CM | POA: Diagnosis not present

## 2017-10-10 DIAGNOSIS — J9382 Other air leak: Secondary | ICD-10-CM | POA: Diagnosis not present

## 2017-10-10 DIAGNOSIS — Z8719 Personal history of other diseases of the digestive system: Secondary | ICD-10-CM

## 2017-10-10 DIAGNOSIS — K851 Biliary acute pancreatitis without necrosis or infection: Secondary | ICD-10-CM | POA: Diagnosis present

## 2017-10-10 DIAGNOSIS — J939 Pneumothorax, unspecified: Secondary | ICD-10-CM | POA: Diagnosis not present

## 2017-10-10 DIAGNOSIS — I214 Non-ST elevation (NSTEMI) myocardial infarction: Secondary | ICD-10-CM

## 2017-10-10 HISTORY — DX: Pure hypercholesterolemia, unspecified: E78.00

## 2017-10-10 HISTORY — DX: Type 2 diabetes mellitus without complications: E11.9

## 2017-10-10 LAB — LIPID PANEL
CHOLESTEROL: 137 mg/dL (ref 0–200)
HDL: 38 mg/dL — ABNORMAL LOW (ref >40)
LDL Cholesterol: 70 mg/dL (ref 0–99)
TRIGLYCERIDES: 144 mg/dL (ref <150)
Total CHOL/HDL Ratio: 3.6 RATIO
VLDL: 29 mg/dL (ref 0–40)

## 2017-10-10 LAB — URINALYSIS, ROUTINE W REFLEX MICROSCOPIC
Bilirubin Urine: NEGATIVE
HGB URINE DIPSTICK: NEGATIVE
Ketones, ur: NEGATIVE mg/dL
Leukocytes, UA: NEGATIVE
Nitrite: NEGATIVE
PH: 6 (ref 5.0–8.0)
Protein, ur: 100 mg/dL — AB
SPECIFIC GRAVITY, URINE: 1.025 (ref 1.005–1.030)

## 2017-10-10 LAB — CBC
HCT: 34.7 % — ABNORMAL LOW (ref 36.0–46.0)
HEMOGLOBIN: 11.5 g/dL — AB (ref 12.0–15.0)
MCH: 26.4 pg (ref 26.0–34.0)
MCHC: 33.1 g/dL (ref 30.0–36.0)
MCV: 79.6 fL (ref 78.0–100.0)
PLATELETS: 374 10*3/uL (ref 150–400)
RBC: 4.36 MIL/uL (ref 3.87–5.11)
RDW: 17 % — ABNORMAL HIGH (ref 11.5–15.5)
WBC: 11.3 10*3/uL — ABNORMAL HIGH (ref 4.0–10.5)

## 2017-10-10 LAB — PROTIME-INR
INR: 1.01
PROTHROMBIN TIME: 13.2 s (ref 11.4–15.2)

## 2017-10-10 LAB — COMPREHENSIVE METABOLIC PANEL
ALK PHOS: 231 U/L — AB (ref 38–126)
ALT: 49 U/L (ref 14–54)
ANION GAP: 11 (ref 5–15)
AST: 26 U/L (ref 15–41)
Albumin: 3.1 g/dL — ABNORMAL LOW (ref 3.5–5.0)
BILIRUBIN TOTAL: 0.6 mg/dL (ref 0.3–1.2)
BUN: 14 mg/dL (ref 6–20)
CALCIUM: 9.6 mg/dL (ref 8.9–10.3)
CO2: 22 mmol/L (ref 22–32)
CREATININE: 0.93 mg/dL (ref 0.44–1.00)
Chloride: 98 mmol/L — ABNORMAL LOW (ref 101–111)
GFR calc non Af Amer: >60 mL/min (ref >60)
GLUCOSE: 254 mg/dL — AB (ref 65–99)
Potassium: 3.4 mmol/L — ABNORMAL LOW (ref 3.5–5.1)
Sodium: 131 mmol/L — ABNORMAL LOW (ref 135–145)
TOTAL PROTEIN: 7.8 g/dL (ref 6.5–8.1)

## 2017-10-10 LAB — APTT: aPTT: 32 seconds (ref 24–36)

## 2017-10-10 LAB — TROPONIN I: TROPONIN I: 0.18 ng/mL — AB (ref <0.03)

## 2017-10-10 LAB — I-STAT TROPONIN, ED: Troponin i, poc: 0.11 ng/mL (ref 0.00–0.08)

## 2017-10-10 LAB — I-STAT BETA HCG BLOOD, ED (MC, WL, AP ONLY): I-stat hCG, quantitative: <5 m[IU]/mL (ref <5)

## 2017-10-10 LAB — LIPASE, BLOOD: Lipase: 187 U/L — ABNORMAL HIGH (ref 11–51)

## 2017-10-10 MED ORDER — MORPHINE SULFATE (PF) 4 MG/ML IV SOLN
4.0000 mg | Freq: Once | INTRAVENOUS | Status: AC
  Administered 2017-10-10: 4 mg via INTRAVENOUS
  Filled 2017-10-10: qty 1

## 2017-10-10 MED ORDER — HEPARIN (PORCINE) IN NACL 100-0.45 UNIT/ML-% IJ SOLN
1550.0000 [IU]/h | INTRAMUSCULAR | Status: DC
  Administered 2017-10-10: 900 [IU]/h via INTRAVENOUS
  Administered 2017-10-12 – 2017-10-14 (×3): 1550 [IU]/h via INTRAVENOUS
  Filled 2017-10-10 (×6): qty 250

## 2017-10-10 MED ORDER — IOPAMIDOL (ISOVUE-300) INJECTION 61%
INTRAVENOUS | Status: AC
  Administered 2017-10-10: 100 mL
  Filled 2017-10-10: qty 100

## 2017-10-10 MED ORDER — ONDANSETRON HCL 4 MG/2ML IJ SOLN
4.0000 mg | Freq: Three times a day (TID) | INTRAMUSCULAR | Status: DC | PRN
  Administered 2017-10-11: 4 mg via INTRAVENOUS
  Filled 2017-10-10: qty 2

## 2017-10-10 MED ORDER — ATORVASTATIN CALCIUM 40 MG PO TABS
40.0000 mg | ORAL_TABLET | Freq: Every day | ORAL | Status: DC
  Administered 2017-10-11 – 2017-10-14 (×4): 40 mg via ORAL
  Filled 2017-10-10 (×5): qty 1

## 2017-10-10 MED ORDER — ONDANSETRON HCL 4 MG/2ML IJ SOLN
4.0000 mg | Freq: Once | INTRAMUSCULAR | Status: AC
  Administered 2017-10-10: 4 mg via INTRAVENOUS
  Filled 2017-10-10: qty 2

## 2017-10-10 MED ORDER — SODIUM CHLORIDE 0.9 % IV SOLN
INTRAVENOUS | Status: DC
  Administered 2017-10-10 – 2017-10-13 (×2): via INTRAVENOUS

## 2017-10-10 MED ORDER — SODIUM CHLORIDE 0.9 % IV SOLN
INTRAVENOUS | Status: DC
  Administered 2017-10-10: via INTRAVENOUS

## 2017-10-10 MED ORDER — ASPIRIN 81 MG PO CHEW
324.0000 mg | CHEWABLE_TABLET | Freq: Once | ORAL | Status: AC
Start: 2017-10-10 — End: 2017-10-10
  Administered 2017-10-10: 324 mg via ORAL
  Filled 2017-10-10: qty 4

## 2017-10-10 MED ORDER — POTASSIUM CHLORIDE 10 MEQ/100ML IV SOLN
10.0000 meq | INTRAVENOUS | Status: AC
  Administered 2017-10-11 (×2): 10 meq via INTRAVENOUS
  Filled 2017-10-10 (×2): qty 100

## 2017-10-10 MED ORDER — ONDANSETRON HCL 4 MG/2ML IJ SOLN: 4.0000 mg | Freq: Three times a day (TID) | INTRAMUSCULAR | Status: DC

## 2017-10-10 MED ORDER — INSULIN ASPART 100 UNIT/ML ~~LOC~~ SOLN
0.0000 [IU] | Freq: Three times a day (TID) | SUBCUTANEOUS | Status: DC
  Administered 2017-10-11: 1 [IU] via SUBCUTANEOUS
  Administered 2017-10-12 (×2): 2 [IU] via SUBCUTANEOUS
  Administered 2017-10-13: 3 [IU] via SUBCUTANEOUS
  Administered 2017-10-13: 1 [IU] via SUBCUTANEOUS
  Administered 2017-10-14: 2 [IU] via SUBCUTANEOUS
  Filled 2017-10-10: qty 1

## 2017-10-10 MED ORDER — MORPHINE SULFATE (PF) 4 MG/ML IV SOLN
4.0000 mg | INTRAVENOUS | Status: DC | PRN
  Administered 2017-10-11: 4 mg via INTRAVENOUS
  Filled 2017-10-10 (×2): qty 1

## 2017-10-10 MED ORDER — METOPROLOL TARTRATE 25 MG PO TABS
25.0000 mg | ORAL_TABLET | Freq: Two times a day (BID) | ORAL | Status: DC
  Administered 2017-10-11 – 2017-10-18 (×15): 25 mg via ORAL
  Filled 2017-10-10 (×15): qty 1

## 2017-10-10 MED ORDER — HEPARIN SODIUM (PORCINE) 5000 UNIT/ML IJ SOLN
4000.0000 [IU] | Freq: Once | INTRAMUSCULAR | Status: AC
  Administered 2017-10-10: 4000 [IU] via INTRAVENOUS
  Filled 2017-10-10: qty 1

## 2017-10-10 MED ORDER — SODIUM CHLORIDE 0.9 % IV BOLUS (SEPSIS)
1000.0000 mL | Freq: Once | INTRAVENOUS | Status: AC
  Administered 2017-10-10: 1000 mL via INTRAVENOUS

## 2017-10-10 MED ORDER — ASPIRIN 81 MG PO CHEW
81.0000 mg | CHEWABLE_TABLET | Freq: Every day | ORAL | Status: DC
  Administered 2017-10-11 – 2017-10-13 (×3): 81 mg via ORAL
  Filled 2017-10-10 (×3): qty 1

## 2017-10-10 MED ORDER — SODIUM CHLORIDE 0.9 % IV BOLUS (SEPSIS)
2000.0000 mL | Freq: Once | INTRAVENOUS | Status: AC
  Administered 2017-10-10: 2000 mL via INTRAVENOUS

## 2017-10-10 NOTE — ED Notes (Signed)
Pt transported to CT after EMT to complete 30 minute EKG

## 2017-10-10 NOTE — ED Notes (Signed)
Tatyana. PA made aware of Troponin of 0.18

## 2017-10-10 NOTE — Consult Note (Signed)
Cardiology Consultation:   Patient ID: Victoria Eaton; [id number]; Jul 04, 1961   Admit date: 10/10/2017 Date of Consult: 10/10/2017  Primary Care Provider: Leonie Douglas, PA-C Primary Cardiologist: None Primary Electrophysiologist:  None   Patient Profile:   Victoria Eaton is a 57 y.o. female with a hx of CAD c/b MI s/p PCI, DM2, HTN, depression who is being seen today for the evaluation of ECG changes and positive troponin at the request of Antonietta Breach.  History of Present Illness:   [name] with a history of CAD c/b MI s/p DES to RCA, DM2, HTN, depression who presented to the ED for abdominal pain. Cardiology was consulted for evaluation of ECG changes and positive troponin.  The patient has a history of CAD with remote MI and PCI in 2006 in [place]. She is unaware of the details of this procedure and does not follow with a cardiologist.   She was hospitalized last week for UTI and pyelonephritis and was discharged home with antibiotics. She presented back to the ED this evening with two days of abdominal pain, constipation, and nausea without any vomiting. She denied chest pain or dyspnea. Labs were notable for  a troponin of 0.18. ECG showed NSR with Q waves in V1-V3 with ST changes including 1.5 mm ST elevation in V3 and ~1 mm in V4. Given these findings, cardiology was consulted.  On my evaluation, the patient reports some continued intermittent abdominal pain, which she attributes to constipation. She has mild nausea but denies chest pain, dyspnea, LE edema or other complaints. She is unable to tell me details of her cardiac history.   Past Medical History:  Diagnosis Date  . Allergy   . Anxiety   . Arthritis   . Bladder prolapse, female, acquired 09/14/2017  . Depression   . Diabetes mellitus without complication ([place])   . History of stomach ulcers   . Hypertension   . Myocardial infarction ([place]) 12/31/2004    Past Surgical History:  Procedure Laterality Date   . CORONARY ANGIOPLASTY WITH STENT PLACEMENT  12/30/2004   Patient reported     Home Medications:  Prior to Admission medications   Medication Sig Start Date End Date Taking? Authorizing Provider  amLODipine (NORVASC) 5 MG tablet Take 1 tablet (5 mg total) by mouth daily. 10/04/17  Yes Helberg, Larkin Ina, MD  aspirin 325 MG EC tablet Take 1 tablet (325 mg total) by mouth at bedtime. Patient taking differently: Take 81 mg by mouth at bedtime.  05/01/17  Yes Timmothy Euler, [place] D, PA-C  atorvastatin (LIPITOR) 40 MG tablet Take 1 tablet (40 mg total) by mouth daily. 09/07/17  Yes Shawnee Knapp, MD  buPROPion (WELLBUTRIN XL) 150 MG 24 hr tablet TAKE ONE TABLET BY MOUTH ONE TIME DAILY 08/06/17  Yes Tenna Delaine D, PA-C  EPINEPHrine (EPIPEN 2-PAK) 0.3 mg/0.3 mL IJ SOAJ injection Inject 0.3 mLs (0.3 mg total) into the muscle once as needed. Patient taking differently: Inject 0.3 mg into the muscle as needed (allergic reaction).  05/20/17  Yes Tenna Delaine D, PA-C  FARXIGA 10 MG TABS tablet TAKE ONE TABLET BY MOUTH ONE TIME DAILY 08/06/17  Yes Timmothy Euler, [place] D, PA-C  glipiZIDE (GLUCOTROL) 10 MG tablet TAKE ONE TABLET BY MOUTH TWICE A DAY BEFORE A MEAL 08/06/17  Yes Timmothy Euler, [place] D, PA-C  lisinopril (PRINIVIL,ZESTRIL) 40 MG tablet Take 1 tablet (40 mg total) by mouth daily. 05/20/17  Yes Timmothy Euler, [place] D, PA-C  loratadine (CLARITIN) 10 MG tablet Take 1 tablet (  10 mg total) by mouth daily. 05/01/17  Yes Timmothy Euler, [place] D, PA-C  metoprolol tartrate (LOPRESSOR) 25 MG tablet Take 1 tablet (25 mg total) by mouth 2 (two) times daily. 05/01/17  Yes Timmothy Euler, [place] D, PA-C  Multiple Vitamin (MULTIVITAMIN WITH MINERALS) TABS tablet Take 1 tablet by mouth daily.   Yes [provider]  Omega-3 Fatty Acids (FISH OIL CONCENTRATE) 300 MG CAPS Take 1 capsule by mouth daily.   Yes [provider]  sertraline (ZOLOFT) 50 MG tablet Take 1 tablet (50 mg total) by mouth daily. 09/07/17  Yes  Shawnee Knapp, MD  sitaGLIPtin (JANUVIA) 100 MG tablet Take 1 tablet (100 mg total) by mouth daily. 09/07/17  Yes Shawnee Knapp, MD    Inpatient Medications: Scheduled Meds:  Continuous Infusions: . sodium chloride 10 mL/hr at 10/10/17 1902  . sodium chloride    . heparin     PRN Meds:   Allergies:    Allergies  Allergen Reactions  . Kiwi Extract Hives  . Shrimp [Shellfish Allergy] Hives  . Penicillins     Reaction as a child     Social History:   Social History   Socioeconomic History  . Marital status: Married    Spouse name: Not on file  . Number of children: Not on file  . Years of education: Not on file  . Highest education level: Not on file  Social Needs  . Financial resource strain: Not on file  . Food insecurity - worry: Not on file  . Food insecurity - inability: Not on file  . Transportation needs - medical: Not on file  . Transportation needs - non-medical: Not on file  Occupational History  . Not on file  Tobacco Use  . Smoking status: Current Every Day Smoker    Packs/day: 0.50    Years: 45.00    Pack years: 22.50    Types: Cigarettes    Start date: 05/01/1972  . Smokeless tobacco: Never Used  Substance and Sexual Activity  . Alcohol use: Yes    Alcohol/week: 12.0 oz    Types: 20 Glasses of wine per week  . Drug use: No  . Sexual activity: No  Other Topics Concern  . Not on file  Social History Narrative  . Not on file    Family History:    Family History  Problem Relation Age of Onset  . Heart disease Father   . Hyperlipidemia Father   . Hypertension Father      ROS:  All other ROS reviewed and negative.     Physical Exam/Data:   Vitals:   10/10/17 2030 10/10/17 2145 10/10/17 2200 10/10/17 2245  BP: (!) 111/59 134/67 133/68 (!) 141/72  Pulse: 74 75 73 79  Resp: 19 15 17 19   Temp:      TempSrc:      SpO2: 94% 95% 95% 94%   No intake or output data in the 24 hours ending 10/10/17 2250 There were no vitals filed for this  visit. There is no height or weight on file to calculate BMI.  General:  Well nourished, well developed, in no acute distress HEENT: normal Lymph: no adenopathy Neck: no JVD Cardiac:  normal S1, S2; RRR; no murmur   Lungs:  clear to auscultation bilaterally, no wheezing, rhonchi or rales  Abd: soft with mild tenderness to palpation Ext: no edema Musculoskeletal:  No deformities, BUE and BLE strength normal and equal Skin: warm and dry  Neuro:  No  focal abnormalities noted Psych:  Normal affect   EKG:  The EKG was personally reviewed and demonstrates:  NSR with Q waves V1-V3 and ST changes including 1 mm ST elevation in V3 and ~1 mm in V4 as well as other nonspecific ST changes Telemetry:  Telemetry was personally reviewed and demonstrates:  NSR  Relevant CV Studies: TTE 2006 ([facility])  Conclusions: 1) There is normal left ventricular systolic   function.   2) There is trace mitral regurgitation by color flow doppler.   3) There is trace tricuspid regurgitation present by color flow doppler.  [place] 2006 [facility]) Experienced acute inferior wall myocardial infarction and complete heart block in April of 2006 and underwent emergency right coronary artery re-canalization with implantation of a drug-eluding Taxus stent. At that time, she had non-obstructive left anterior descending disease with some disease in the diagonal branches and a normal circumflex vessel.   Laboratory Data:  Chemistry Recent Labs  Lab 10/10/17 1753  NA 131*  K 3.4*  CL 98*  CO2 22  GLUCOSE 254*  BUN 14  CREATININE 0.93  CALCIUM 9.6  GFRNONAA >60  GFRAA >60  ANIONGAP 11    Recent Labs  Lab 10/10/17 1753  PROT 7.8  ALBUMIN 3.1*  AST 26  ALT 49  ALKPHOS 231*  BILITOT 0.6   Hematology Recent Labs  Lab 10/10/17 1753  WBC 11.3*  RBC 4.36  HGB 11.5*  HCT 34.7*  MCV 79.6  MCH 26.4  MCHC 33.1  RDW 17.0*  PLT 374   Cardiac Enzymes Recent Labs  Lab 10/10/17 1848    TROPONINI 0.18*    Recent Labs  Lab 10/10/17 1828  TROPIPOC 0.11*    BNPNo results for input(s): BNP, PROBNP in the last 168 hours.  DDimer No results for input(s): DDIMER in the last 168 hours.  Radiology/Studies:  Ct Abdomen Pelvis W Contrast  Result Date: 10/10/2017 CLINICAL DATA:  Pancreatitis. EXAM: CT ABDOMEN AND PELVIS WITH CONTRAST TECHNIQUE: Multidetector CT imaging of the abdomen and pelvis was performed using the standard protocol following bolus administration of intravenous contrast. CONTRAST:  184m ISOVUE-300 IOPAMIDOL (ISOVUE-300) INJECTION 61% COMPARISON:  06/21/2004 FINDINGS: Lower chest: No acute abnormality. Hepatobiliary: No suspicious liver abnormalities. Large cluster of stones within the dependent portion of the gallbladder measures 1.8 cm. No intrahepatic biliary dilatation. No common bile duct dilatation. Pancreas: Mild peripancreatic fat stranding identified compatible with acute pancreatitis. No pancreatic duct dilatation, pancreatic necrosis or pseudocyst. Spleen: Normal in size without focal abnormality. Adrenals/Urinary Tract: The adrenal glands are normal. Bilateral hydronephrosis identified. Bilateral hydroureter is also noted. Pelvic floor laxity with large cystocele identified, image 62 of series 7. Stomach/Bowel: Stomach is normal. Small bowel loops have a normal course and caliber without obstruction. The appendix is not visualized separate from the right lower quadrant bowel loops. There is wall thickening and mild pericolonic fat stranding involving the descending colon and sigmoid colon suspicious for colitis. No complications identified. No pneumatosis, or perforation. No abscess. Vascular/Lymphatic: Aortic atherosclerosis. No aneurysm. No upper abdominal adenopathy. There is no pelvic or inguinal adenopathy. Reproductive: There is significant pelvic floor laxity. The uterus and adnexal structures are otherwise unremarkable. Other: No free fluid or fluid  collections identified. Musculoskeletal: No aggressive lytic or sclerotic bone lesions. IMPRESSION: 1. Mild inflammatory changes involving the pancreas compatible with pancreatitis. No complications identified. 2. Multiple gallstones identified. No biliary dilatation and no specific findings to suggest choledocholithiasis. 3. Mild wall thickening involving the descending colon and sigmoid colon concerning  for segmental colitis. Likely inflammatory or infectious. 4. Pelvic floor laxity with cystocele. Bilateral hydronephrosis and hydroureter is likely related to cystocele. 5.  Aortic Atherosclerosis (ICD10-I70.0). Electronically Signed   By: Kerby Moors M.D.   On: 10/10/2017 20:19   Dg Chest Port 1 View  Result Date: 10/10/2017 CLINICAL DATA:  Chest pain/code stemi EXAM: PORTABLE CHEST 1 VIEW COMPARISON:  10/02/2017 FINDINGS: Normal mediastinum and cardiac silhouette. Normal pulmonary vasculature. No evidence of effusion, infiltrate, or pneumothorax. No acute bony abnormality. IMPRESSION: No acute cardiopulmonary process. Electronically Signed   By: Suzy Bouchard M.D.   On: 10/10/2017 19:38    Assessment and Plan:   Ms. Finken with a history of CAD c/b MI s/p DES to RCA, DM2, HTN, depression who presented to the ED for abdominal pain. Cardiology was consulted for evaluation of ECG changes and positive troponin.  History of CAD Positive troponin Abnormal ECG The patient has a history of CAD with remote MI and PCI without recent cardiology follow up. She presents to the ED with abdominal pain. Cardiology was consulted for mildly elevated troponin and abnormal ECG. While her ECG shows mild ST elevations in mid-precordial leads, this is in the setting of large Q waves suggestive of prior infarction. At this time, she appears comfortable and denies any cardiopulmonary symptoms but rather endorses only mild abdominal pain. While her female sex and diabetes can allow angina to manifest with atypical  symptoms, her presentation does not suggest acute MI. Since the time of my evaluation, she has been found to have radiographic and laboratory evidence suggestive of pancreatitis, which is likely the cause of her symptoms. Her troponin elevation may represent demand ischemia secondary to her pancreatitis. Nevertheless, given her mixed findings on presentation and history of CAD with prior MI, empiric treatment for ACS is appropriate until her clinical picture is better characterized. -Agree with ongoing management of presenting abdominal pain -Please trend symptoms and follow QRST morphology on regular ECGs for any dynamic changes -Please start empiric heparin infusion per ACS nomogram.  -Continue home ASA, atorvastatin, metoprolol -Echocardiogram ordered -We will follow along to determine need/timing of further ischemic evaluation.  Case discussed with Dr. Gwenlyn Found   For questions or updates, please contact [place] Please consult [email or web address] for contact info under Cardiology/STEMI.   Signed, Nila Nephew, MD  10/10/2017 10:50 PM

## 2017-10-10 NOTE — ED Notes (Signed)
Cardiology at bedside.

## 2017-10-10 NOTE — ED Notes (Signed)
Instructed by Dr. Dareen Piano to continue 30 minute EKGs on patient

## 2017-10-10 NOTE — ED Notes (Signed)
Spoke with resident, order will be changed for EKGs to be done every 3 hours.

## 2017-10-10 NOTE — Progress Notes (Addendum)
ANTICOAGULATION CONSULT NOTE - Initial Consult  Pharmacy Consult for Heparin  Indication: chest pain/ACS  Allergies  Allergen Reactions  . Kiwi Extract Hives  . Shrimp [Shellfish Allergy] Hives  . Penicillins     Reaction as a child    Vital Signs: Temp: 98.1 F (36.7 C) (01/19 1748) Temp Source: Oral (01/19 1748) BP: 133/68 (01/19 2200) Pulse Rate: 73 (01/19 2200)  Labs: Recent Labs    10/10/17 1753 10/10/17 1848  HGB 11.5*  --   HCT 34.7*  --   PLT 374  --   APTT  --  32  LABPROT  --  13.2  INR  --  1.01  CREATININE 0.93  --   TROPONINI  --  0.18*    Estimated Creatinine Clearance: 68 mL/min (by C-G formula based on SCr of 0.93 mg/dL).   Medical History: Past Medical History:  Diagnosis Date  . Allergy   . Anxiety   . Arthritis   . Bladder prolapse, female, acquired 09/14/2017  . Depression   . Diabetes mellitus without complication ([place])   . History of stomach ulcers   . Hypertension   . Myocardial infarction ([place]) 12/31/2004   Assessment: 57 y/o F to start heparin per pharmacy for mildly elevated troponin, Hgb 11.5, Plts good, renal function good, PTA meds reviewed.   Goal of Therapy:  Heparin level 0.3-0.7 units/ml Monitor platelets by anticoagulation protocol: Yes   Plan:  -Heparin 4000 units BOLUS already given in ED -Start heparin drip at 900 units/hr -0600 HL -Daily CBC/HL -Monitor for bleeding  Narda Bonds 10/10/2017,10:46 PM   ======================= Addendum 6:44 AM Heparin level undetectable this AM, no issues per RN -Heparin 2000 units BOLUS -Inc heparin to 1050 units/hr -1400 HL Narda Bonds, PharmD, BCPS Clinical Pharmacist Phone: [phone] ========================

## 2017-10-10 NOTE — ED Notes (Signed)
Code [facility]emi @ [phone]

## 2017-10-10 NOTE — ED Notes (Signed)
X-ray at bedside

## 2017-10-10 NOTE — ED Provider Notes (Signed)
[facility] EMERGENCY DEPARTMENT Provider Note   CSN: [id number] Arrival date & time: 10/10/17  1733     History   Chief Complaint Chief Complaint  Patient presents with  . Abdominal Pain    HPI Victoria Eaton is a 57 y.o. female.  HPI Victoria Eaton is a 57 y.o. female with history of diabetes, hypertension, coronary artery disease, recent admission to the hospital for pyelonephritis, presents to emergency department complaining of epigastric abdominal pain.  Patient states that her pain started yesterday evening.  She states the pain is constant, dull, worsened with palpation.  Reports associated nausea.  No vomiting.  She states her urinary symptoms have improved since her discharge from the hospital.  Denies any chest pain otherwise.  No shortness of breath.  History of cardiac stent in 2005.  She states nothing is making her symptoms better or worse otherwise.  No treatment prior to coming to the hospital.  She does report drinking alcohol, states she does not drink heavy, but drinks daily, reports that she drinks 1-2 glasses of wine a day.  Denies history of gallbladder problems.  No prior abdominal surgeries.  Past Medical History:  Diagnosis Date  . Allergy   . Anxiety   . Arthritis   . Bladder prolapse, female, acquired 09/14/2017  . Depression   . Diabetes mellitus without complication ([place])   . History of stomach ulcers   . Hypertension   . Myocardial infarction ([place]) 12/31/2004    Patient Active Problem List   Diagnosis Date Noted  . Pyelonephritis 10/02/2017  . Hyperlipidemia LDL goal <70 09/14/2017  . Type 2 diabetes mellitus without complication, without long-term current use of insulin ([place]) 05/01/2017  . Essential hypertension 05/01/2017  . Anxiety and depression 05/01/2017  . Tobacco abuse 05/01/2017  . History of MI (myocardial infarction) 05/01/2017  . Allergic rhinitis 05/01/2017  . History of arthritis 05/01/2017  . Bladder  prolapse, female, acquired 11/09/2013    Past Surgical History:  Procedure Laterality Date  . CORONARY ANGIOPLASTY WITH STENT PLACEMENT  12/30/2004   Patient reported    OB History    No data available       Home Medications    Prior to Admission medications   Medication Sig Start Date End Date Taking? Authorizing Provider  amLODipine (NORVASC) 5 MG tablet Take 1 tablet (5 mg total) by mouth daily. 10/04/17   Ina Homes, MD  aspirin 325 MG EC tablet Take 1 tablet (325 mg total) by mouth at bedtime. 05/01/17   Tenna Delaine D, PA-C  atorvastatin (LIPITOR) 40 MG tablet Take 1 tablet (40 mg total) by mouth daily. 09/07/17   Shawnee Knapp, MD  buPROPion (WELLBUTRIN XL) 150 MG 24 hr tablet TAKE ONE TABLET BY MOUTH ONE TIME DAILY 08/06/17   Tenna Delaine D, PA-C  EPINEPHrine (EPIPEN 2-PAK) 0.3 mg/0.3 mL IJ SOAJ injection Inject 0.3 mLs (0.3 mg total) into the muscle once as needed. Patient taking differently: Inject 0.3 mg into the muscle as needed (allergic reaction).  05/20/17   Tenna Delaine D, PA-C  FARXIGA 10 MG TABS tablet TAKE ONE TABLET BY MOUTH ONE TIME DAILY 08/06/17   Tenna Delaine D, PA-C  glipiZIDE (GLUCOTROL) 10 MG tablet TAKE ONE TABLET BY MOUTH TWICE A DAY BEFORE A MEAL 08/06/17   Timmothy Euler, [place] D, PA-C  lisinopril (PRINIVIL,ZESTRIL) 40 MG tablet Take 1 tablet (40 mg total) by mouth daily. 05/20/17   Tenna Delaine D, PA-C  loratadine (CLARITIN) 10 MG  tablet Take 1 tablet (10 mg total) by mouth daily. 05/01/17   Tenna Delaine D, PA-C  metoprolol tartrate (LOPRESSOR) 25 MG tablet Take 1 tablet (25 mg total) by mouth 2 (two) times daily. 05/01/17   Tenna Delaine D, PA-C  Multiple Vitamin (MULTIVITAMIN WITH MINERALS) TABS tablet Take 1 tablet by mouth daily.    [provider]  Omega-3 Fatty Acids (FISH OIL CONCENTRATE) 300 MG CAPS Take 1 capsule by mouth daily.    [provider]  sertraline (ZOLOFT) 50 MG tablet Take 1 tablet (50  mg total) by mouth daily. 09/07/17   Shawnee Knapp, MD  sitaGLIPtin (JANUVIA) 100 MG tablet Take 1 tablet (100 mg total) by mouth daily. 09/07/17   Shawnee Knapp, MD    Family History Family History  Problem Relation Age of Onset  . Heart disease Father   . Hyperlipidemia Father   . Hypertension Father     Social History Social History   Tobacco Use  . Smoking status: Current Every Day Smoker    Packs/day: 0.50    Years: 45.00    Pack years: 22.50    Types: Cigarettes    Start date: 05/01/1972  . Smokeless tobacco: Never Used  Substance Use Topics  . Alcohol use: Yes    Alcohol/week: 12.0 oz    Types: 20 Glasses of wine per week  . Drug use: No     Allergies   Kiwi extract; Shrimp [shellfish allergy]; and Penicillins   Review of Systems Review of Systems  Constitutional: Negative for chills and fever.  Respiratory: Negative for cough, chest tightness and shortness of breath.   Cardiovascular: Negative for chest pain, palpitations and leg swelling.  Gastrointestinal: Positive for abdominal pain and nausea. Negative for diarrhea and vomiting.  Genitourinary: Negative for dysuria, flank pain, pelvic pain, vaginal bleeding, vaginal discharge and vaginal pain.  Musculoskeletal: Negative for arthralgias, myalgias, neck pain and neck stiffness.  Skin: Negative for rash.  Neurological: Negative for dizziness, weakness and headaches.  All other systems reviewed and are negative.    Physical Exam Updated Vital Signs BP (!) 151/77 (BP Location: Right Arm)   Pulse 88   Temp 98.1 F (36.7 C) (Oral)   Resp 17   SpO2 100%   Physical Exam  Constitutional: She appears well-developed and well-nourished. No distress.  HENT:  Head: Normocephalic.  Eyes: Conjunctivae are normal.  Neck: Neck supple.  Cardiovascular: Normal rate, regular rhythm and normal heart sounds.  Pulmonary/Chest: Effort normal and breath sounds normal. No respiratory distress. She has no wheezes. She has  no rales.  Abdominal: Soft. Bowel sounds are normal. She exhibits no distension. There is tenderness. There is no rebound.  Mild epigastric tenderness, no guarding  Musculoskeletal: She exhibits no edema.  Neurological: She is alert.  Skin: Skin is warm and dry.  Psychiatric: She has a normal mood and affect. Her behavior is normal.  Nursing note and vitals reviewed.    ED Treatments / Results  Labs (all labs ordered are listed, but only abnormal results are displayed) Labs Reviewed  LIPASE, BLOOD - Abnormal; Notable for the following components:      Result Value   Lipase 187 (*)    All other components within normal limits  COMPREHENSIVE METABOLIC PANEL - Abnormal; Notable for the following components:   Sodium 131 (*)    Potassium 3.4 (*)    Chloride 98 (*)    Glucose, Bld 254 (*)    Albumin 3.1 (*)  Alkaline Phosphatase 231 (*)    All other components within normal limits  CBC - Abnormal; Notable for the following components:   WBC 11.3 (*)    Hemoglobin 11.5 (*)    HCT 34.7 (*)    RDW 17.0 (*)    All other components within normal limits  URINALYSIS, ROUTINE W REFLEX MICROSCOPIC - Abnormal; Notable for the following components:   APPearance HAZY (*)    Glucose, UA >=500 (*)    Protein, ur 100 (*)    Bacteria, UA RARE (*)    Squamous Epithelial / LPF 0-5 (*)    All other components within normal limits  I-STAT TROPONIN, ED - Abnormal; Notable for the following components:   Troponin i, poc 0.11 (*)    All other components within normal limits  PROTIME-INR  APTT  TROPONIN I  LIPID PANEL  I-STAT BETA HCG BLOOD, ED ([facility], [facility], [facility] ONLY)    EKG  EKG Interpretation  Date/Time:  Saturday October 10 2017 17:59:29 EST Ventricular Rate:  83 PR Interval:  166 QRS Duration: 104 QT Interval:  332 QTC Calculation: 390 R Axis:   66 Text Interpretation:  Normal sinus rhythm Anterior infarct , age undetermined Abnormal ECG Confirmed by Nat Christen [phone]) on 10/10/2017  6:40:42 PM       Radiology Dg Chest Port 1 View  Result Date: 10/10/2017 CLINICAL DATA:  Chest pain/code stemi EXAM: PORTABLE CHEST 1 VIEW COMPARISON:  10/02/2017 FINDINGS: Normal mediastinum and cardiac silhouette. Normal pulmonary vasculature. No evidence of effusion, infiltrate, or pneumothorax. No acute bony abnormality. IMPRESSION: No acute cardiopulmonary process. Electronically Signed   By: Suzy Bouchard M.D.   On: 10/10/2017 19:38    Procedures Procedures (including critical care time)  Medications Ordered in ED Medications  0.9 %  sodium chloride infusion (not administered)  aspirin chewable tablet 324 mg (not administered)  heparin injection 4,000 Units (not administered)  morphine 4 MG/ML injection 4 mg (not administered)  ondansetron (ZOFRAN) injection 4 mg (not administered)     Initial Impression / Assessment and Plan / ED Course  I have reviewed the triage vital signs and the nursing notes.  Pertinent labs & imaging results that were available during my care of the patient were reviewed by me and considered in my medical decision making (see chart for details).     6:57 PM Patient in our emergency department with epigastric abdominal pain that started last night.  On initial evaluation, was noted the patient has slight new ST elevations in V3 V4, patient does have history of coronary artery disease.  Her troponin came back slightly elevated as well as 0.11.  I discussed with Dr. Lovena Le cardiology initially, who advised that we should activate code STEMI if concerned about possible STEMI.  Code STEMI was activated.  Cardiologist is currently at bedside evaluating patient.  Also noticed the patient lipase is elevated 187, wondering if that could contribute to her pain.  Patient is requesting something for pain, morphine ordered.  Aspirin and heparin ordered as well.  8:17 PM Pain mildly improved.  Pending CT abdomen pelvis for further evaluation.  Patient signed  out at shift change.  Patient will need admission to medicine, cardiology is following closely. VS stable.   Vitals:   10/10/17 1748  BP: (!) 151/77  Pulse: 88  Resp: 17  Temp: 98.1 F (36.7 C)  TempSrc: Oral  SpO2: 100%     Final Clinical Impressions(s) / ED Diagnoses   Final diagnoses:  None    ED Discharge Orders    None       Jeannett Senior, Hershal Coria 10/10/17 2017    Nat Christen, MD 10/14/17 1004

## 2017-10-10 NOTE — H&P (Signed)
Date: 10/10/2017               Patient Name:  Victoria Eaton MRN: [id number]  DOB: 1960/12/02 Age / Sex: 57 y.o., female   PCP: Victoria Douglas, PA-C         Medical Service: Internal Medicine Teaching Service         Attending Physician: Dr. Aldine Contes, MD    First Contact: Dr. Tarri Eaton Pager: [phone]  Second Contact: Dr. Danford Eaton Pager: [phone]       After Hours (After 5p/  First Contact Pager: [phone]  weekends / holidays): Second Contact Pager: [phone]   Chief Complaint: abdominal pain  History of Present Illness:  [name] is a 57yo female with PMH of CAD s/p DES to RCA in 2006, HTN, type 2 diabetes, uterine prolapse, stomach ulcers, and anxiety/depression who presents with epigastric abdominal pain.  She was recently admitted 1/11-1/12 for pyelonephritis, treated with ceftriaxone. Discharged with cefdinir for 5 days.  She reports epigastric abdominal pain since yesterday evening. It is constant and 8/10 in severity. She endorses associated nausea and flushing. She states she has not had a bowel movement since yesterday, which is abnormal for her. She typically has 5-6 BMs daily. Denies vomiting, fevers, SOB, CP, or dysuria. Denies prior history of abdominal surgeries or gallstones. No recent travel or sick contacts. She denies having had this pain before. She reports her abdominal pain and nausea are improved compared to presentation. Able to tolerate PO intake.  Prior to her DES in 2006, she reports the pain was limited to her back between her shoulders. She reports this pain feels very different.  She smokes 0.5ppd. Drinks 1 glass of alcohol daily. Denies illicit drug use.  ED Course: - BP 151/77, HR 88, temp 98.1, RR 17, O2 100% on RA - WBC 11.3, Hb 11.5. Trop 0.11 -> 0.18. CMP with Na 131, K 3.4, Cl 98, Cr 0.93, AlkPhos 231, nl AST/ALT. Lipase 187. UA with >/= 500, prot 100. Lipid panel with HDL 38 - CXR without acute cardiopulmonary process. CT A/P with  mild inflammatory changes of pancreas, multiple gallstones without biliary dilatation, mild wall thickening of descending and sigmoid colon (likely inflammatory or infectious), and cystocele with bilateral hydronephrosis and hydroureter - EKG with new ST elevations in V3 and V4. Code STEMI called. Cardiology consulted by EDP. - Received 351m aspirin, morphine, zofran, 3L IV NS bolus, heparin.  Meds:  Current Meds  Medication Sig  . amLODipine (NORVASC) 5 MG tablet Take 1 tablet (5 mg total) by mouth daily.  .Marland Kitchenaspirin 325 MG EC tablet Take 1 tablet (325 mg total) by mouth at bedtime. (Patient taking differently: Take 81 mg by mouth at bedtime. )  . atorvastatin (LIPITOR) 40 MG tablet Take 1 tablet (40 mg total) by mouth daily.  .Marland KitchenbuPROPion (WELLBUTRIN XL) 150 MG 24 hr tablet TAKE ONE TABLET BY MOUTH ONE TIME DAILY  . EPINEPHrine (EPIPEN 2-PAK) 0.3 mg/0.3 mL IJ SOAJ injection Inject 0.3 mLs (0.3 mg total) into the muscle once as needed. (Patient taking differently: Inject 0.3 mg into the muscle as needed (allergic reaction). )  . FARXIGA 10 MG TABS tablet TAKE ONE TABLET BY MOUTH ONE TIME DAILY  . glipiZIDE (GLUCOTROL) 10 MG tablet TAKE ONE TABLET BY MOUTH TWICE A DAY BEFORE A MEAL  . lisinopril (PRINIVIL,ZESTRIL) 40 MG tablet Take 1 tablet (40 mg total) by mouth daily.  .Marland Kitchenloratadine (CLARITIN) 10 MG tablet Take 1 tablet (  10 mg total) by mouth daily.  . metoprolol tartrate (LOPRESSOR) 25 MG tablet Take 1 tablet (25 mg total) by mouth 2 (two) times daily.  . Multiple Vitamin (MULTIVITAMIN WITH MINERALS) TABS tablet Take 1 tablet by mouth daily.  . Omega-3 Fatty Acids (FISH OIL CONCENTRATE) 300 MG CAPS Take 1 capsule by mouth daily.  . sertraline (ZOLOFT) 50 MG tablet Take 1 tablet (50 mg total) by mouth daily.  . sitaGLIPtin (JANUVIA) 100 MG tablet Take 1 tablet (100 mg total) by mouth daily.   Allergies: Allergies as of 10/10/2017 - Review Complete 10/10/2017  Allergen Reaction Noted  . Kiwi  extract Hives 05/01/2017  . Shrimp [shellfish allergy] Hives 05/01/2017  . Penicillins  05/01/2017   Past Medical History:  Diagnosis Date  . Allergy   . Anxiety   . Arthritis   . Bladder prolapse, female, acquired 09/14/2017  . Depression   . Diabetes mellitus without complication ([place])   . History of stomach ulcers   . Hypertension   . Myocardial infarction ([place]) 12/31/2004   Family History:  Family History  Problem Relation Age of Onset  . Heart disease Father   . Hyperlipidemia Father   . Hypertension Father    Social History:  - Smokes 0.5ppd - Drinks 1 glass of alcohol daily - Denies illicit drug use - Lives at home with husband and daughter - Moved to [place] from [place] approximately 1.5 years ago  Review of Systems: Constitutional: Negative for fever, malaise/fatigue. Positive for flushing. HEENT: Negative for sinus pain, congestion, and sore throat. Respiratory: Negative for cough, shortness of breath and wheezing.  Cardiovascular: Negative for chest pain, palpitations, and leg swelling. Gastrointestinal: Negative for blood in stool, vomiting. Positive for epigastric pain and nausea. Genitourinary: Negative for dysuria and hematuria. Musculoskeletal: Negative for joint pain and myalgias. Neurological: Negative for dizziness, focal weakness, weakness and headaches.  Physical Exam: Blood pressure (!) 125/56, pulse 75, temperature 98.1 F (36.7 C), temperature source Oral, resp. rate 19, SpO2 94 %.  GEN: Well-appearing, well-nourished. Alert and oriented. No acute distress.  HENT: Price/AT. Moist mucous membranes. No visible lesions. EYES: PERRL. Sclera non-icteric. Conjunctiva clear. NECK: No cervical LAD. RESP: Clear to auscultation bilaterally. No wheezes, rales, or rhonchi. No increased work of breathing. CV: Normal rate and regular rhythm. No murmurs, gallops, or rubs. No carotid bruits. No LE edema. ABD: Soft. Mild tenderness to palpation in epigastric  area. Decreased bowel sounds. No rebound or guarding. Non-distended. EXT: No edema. Warm and well perfused. NEURO: Cranial nerves II-XII grossly intact. Able to lift all four extremities against gravity. No apparent audiovisual hallucinations. Speech fluent and appropriate. PSYCH: Patient is calm and pleasant. Appropriate affect. Well-groomed; speech is appropriate and on-subject.  Labs CBC Latest Ref Rng & Units 10/10/2017 10/03/2017 10/01/2017  WBC 4.0 - 10.5 K/uL 11.3(H) 4.4 13.7(H)  Hemoglobin 12.0 - 15.0 g/dL 11.5(L) 9.4(L) 11.4(L)  Hematocrit 36.0 - 46.0 % 34.7(L) 29.1(L) 34.6(L)  Platelets 150 - 400 K/uL 374 123(L) 178   CMP Latest Ref Rng & Units 10/10/2017 10/03/2017 10/02/2017  Glucose 65 - 99 mg/dL 254(H) 147(H) 172(H)  BUN 6 - 20 mg/dL 14 16 19   Creatinine 0.44 - 1.00 mg/dL 0.93 1.00 1.16(H)  Sodium 135 - 145 mmol/L 131(L) 128(L) 129(L)  Potassium 3.5 - 5.1 mmol/L 3.4(L) 3.7 3.6  Chloride 101 - 111 mmol/L 98(L) 99(L) 98(L)  CO2 22 - 32 mmol/L 22 20(L) 22  Calcium 8.9 - 10.3 mg/dL 9.6 8.0(L) 8.2(L)  Total Protein  6.5 - 8.1 g/dL 7.8 - -  Total Bilirubin 0.3 - 1.2 mg/dL 0.6 - -  Alkaline Phos 38 - 126 U/L 231(H) - -  AST 15 - 41 U/L 26 - -  ALT 14 - 54 U/L 49 - -   Troponin 0.11 -> 0.18 Lipase 187 UA with >/=500 glucose, 100 ur prot, rare bacteria, 0-5 squam epithelial INR 1.01 Chol 137, TG 144, HDL 38, LDL 70  EKG: personally reviewed my interpretation is mild elevations in V3 and V4, LVH  CXR: personally reviewed my interpretation is no acute cardiopulmonary disease  CT A/P: 1. Mild inflammatory changes involving the pancreas compatible with pancreatitis. No complications identified. 2. Multiple gallstones identified. No biliary dilatation and no specific findings to suggest choledocholithiasis. 3. Mild wall thickening involving the descending colon and sigmoid colon concerning for segmental colitis. Likely inflammatory or infectious. 4. Pelvic floor laxity with  cystocele. Bilateral hydronephrosis and hydroureter is likely related to cystocele. 5.  Aortic Atherosclerosis (ICD10-I70.0).  Assessment & Plan by Problem: Active Problems:   Pancreatitis  Ms. Mcmenamy is a 57yo female with PMH of CAD s/p DES to RCA in 2006, HTN, type 2 diabetes, uterine prolapse, stomach ulcers, and anxiety/depression who presents with epigastric abdominal pain and elevated troponin.  Pancreatitis Patient presents with epigastric abdominal pain, lipase >3x ULN, and mild inflammatory changes on CT. Also noted to have gallstones and elevated alk phos, but no biliary dilatation noted. She denies history of abdominal surgeries or gallstones. Does drink 1 glass of alcohol daily. Lipid panel wnl. Pancreatitis possibly secondary to alcohol vs gallstones. Received 3L IV NS bolus in the ER. Pain well controlled with pain medication and denies current nausea. - NPO - IV NS 323m/hr - IV morphine 423mq3h PRN - IV zofran q8h PRN - CMP and CBC in AM  Troponinemia CAD s/p DES to RCA in 2006 Troponin elevated to 0.11 -> 0.18. EKG with new ST elevations in V3 and V4. Code STEMI called. Cardiology following. Received aspirin, morphine, and heparin in ED. - Appreciate Cardiology recommendations - Telemetry - Trend troponins - EKGs q3 hours - Echo - Continue IV heparin infusion - Aspirin 8134maily starting tomorrow - Continue atorvastatin 23m52mily  Hypokalemia K 3.4 - Repleting with IV K 20mE23mystocele with bilateral hydronephrosis and hydroureter Noted on CT A/P on admission. Recently admitted and treated for pyelonephritis. Patient denies urinary symptoms.  Hx of HTN Home regimen includes lisinopril 23mg 73my, metoprolol 25mg B67mand amlodipine 5mg dai27m BP 128-151/56-77. - Continue home metoprolol  Diabetes Home regimen includes glipizide 10mg dai88msitagliptine 100mg dail109mnd dapagliflozin 10mg daily51mSSI-sensitive - CBG  monitoring  Depression/Anxiety Home regimen includes sertraline 50mg daily 22mbupropion 150mg daily -1ml hold home PO medications while NPO  Diet: NPO VTE PPx: Heparin gtt Code Status: Full code Dispo: Admit patient to Inpatient with expected length of stay greater than 2 midnights.  Signed: Berish Bohman, JennifColbert Ewing9, 11:46 PM  Pager: P [phone] [phone]

## 2017-10-10 NOTE — ED Provider Notes (Signed)
8:45 PM Consult placed to internal medicine residency service as patient was discharged from their service 1 week ago.  Patient to be admitted for acute pancreatitis with elevated troponin level of unclear significance.  Heparin has been ordered as well as IV fluids.  Cardiology to follow while inpatient.  CT scan without evidence of abscess or necrosis to pancreas at this time.  9:17 PM Case discussed with internal medicine residency service who will admit the patient for further workup on monitoring.    Results for orders placed or performed during the hospital encounter of 10/10/17  Lipase, blood  Result Value Ref Range   Lipase 187 (H) 11 - 51 U/L  Comprehensive metabolic panel  Result Value Ref Range   Sodium 131 (L) 135 - 145 mmol/L   Potassium 3.4 (L) 3.5 - 5.1 mmol/L   Chloride 98 (L) 101 - 111 mmol/L   CO2 22 22 - 32 mmol/L   Glucose, Bld 254 (H) 65 - 99 mg/dL   BUN 14 6 - 20 mg/dL   Creatinine, Ser 0.93 0.44 - 1.00 mg/dL   Calcium 9.6 8.9 - 10.3 mg/dL   Total Protein 7.8 6.5 - 8.1 g/dL   Albumin 3.1 (L) 3.5 - 5.0 g/dL   AST 26 15 - 41 U/L   ALT 49 14 - 54 U/L   Alkaline Phosphatase 231 (H) 38 - 126 U/L   Total Bilirubin 0.6 0.3 - 1.2 mg/dL   GFR calc non Af Amer >60 >60 mL/min   GFR calc Af Amer >60 >60 mL/min   Anion gap 11 5 - 15  CBC  Result Value Ref Range   WBC 11.3 (H) 4.0 - 10.5 K/uL   RBC 4.36 3.87 - 5.11 MIL/uL   Hemoglobin 11.5 (L) 12.0 - 15.0 g/dL   HCT 34.7 (L) 36.0 - 46.0 %   MCV 79.6 78.0 - 100.0 fL   MCH 26.4 26.0 - 34.0 pg   MCHC 33.1 30.0 - 36.0 g/dL   RDW 17.0 (H) 11.5 - 15.5 %   Platelets 374 150 - 400 K/uL  Urinalysis, Routine w reflex microscopic  Result Value Ref Range   Color, Urine YELLOW YELLOW   APPearance HAZY (A) CLEAR   Specific Gravity, Urine 1.025 1.005 - 1.030   pH 6.0 5.0 - 8.0   Glucose, UA >=500 (A) NEGATIVE mg/dL   Hgb urine dipstick NEGATIVE NEGATIVE   Bilirubin Urine NEGATIVE NEGATIVE   Ketones, ur NEGATIVE NEGATIVE  mg/dL   Protein, ur 100 (A) NEGATIVE mg/dL   Nitrite NEGATIVE NEGATIVE   Leukocytes, UA NEGATIVE NEGATIVE   RBC / HPF 0-5 0 - 5 RBC/hpf   WBC, UA 0-5 0 - 5 WBC/hpf   Bacteria, UA RARE (A) NONE SEEN   Squamous Epithelial / LPF 0-5 (A) NONE SEEN  Protime-INR  Result Value Ref Range   Prothrombin Time 13.2 11.4 - 15.2 seconds   INR 1.01   APTT  Result Value Ref Range   aPTT 32 24 - 36 seconds  Troponin I  Result Value Ref Range   Troponin I 0.18 (HH) <0.03 ng/mL  Lipid panel  Result Value Ref Range   Cholesterol 137 0 - 200 mg/dL   Triglycerides 144 <150 mg/dL   HDL 38 (L) >40 mg/dL   Total CHOL/HDL Ratio 3.6 RATIO   VLDL 29 0 - 40 mg/dL   LDL Cholesterol 70 0 - 99 mg/dL  I-Stat Troponin, ED (not at [facility])  Result Value Ref Range  Troponin i, poc 0.11 (HH) 0.00 - 0.08 ng/mL   Comment NOTIFIED PHYSICIAN    Comment 3          I-Stat beta hCG blood, ED  Result Value Ref Range   I-stat hCG, quantitative <5.0 <5 mIU/mL   Comment 3           Dg Chest 2 View  Result Date: 10/02/2017 CLINICAL DATA:  57 year old female with fever and cough. EXAM: CHEST  2 VIEW COMPARISON:  None. FINDINGS: The lungs are clear. There is no pleural effusion or pneumothorax. The cardiac silhouette is within normal limits. Mild atherosclerotic calcification of the aortic arch. High attenuating content in the right upper abdomen most likely gallstones Atherosclerotic calcification of the aorta. IMPRESSION: 1. No acute cardiopulmonary process. 2. Probable gallstones. Electronically Signed   By: Anner Crete M.D.   On: 10/02/2017 01:00   Ct Abdomen Pelvis W Contrast  Result Date: 10/10/2017 CLINICAL DATA:  Pancreatitis. EXAM: CT ABDOMEN AND PELVIS WITH CONTRAST TECHNIQUE: Multidetector CT imaging of the abdomen and pelvis was performed using the standard protocol following bolus administration of intravenous contrast. CONTRAST:  126m ISOVUE-300 IOPAMIDOL (ISOVUE-300) INJECTION 61% COMPARISON:  06/21/2004  FINDINGS: Lower chest: No acute abnormality. Hepatobiliary: No suspicious liver abnormalities. Large cluster of stones within the dependent portion of the gallbladder measures 1.8 cm. No intrahepatic biliary dilatation. No common bile duct dilatation. Pancreas: Mild peripancreatic fat stranding identified compatible with acute pancreatitis. No pancreatic duct dilatation, pancreatic necrosis or pseudocyst. Spleen: Normal in size without focal abnormality. Adrenals/Urinary Tract: The adrenal glands are normal. Bilateral hydronephrosis identified. Bilateral hydroureter is also noted. Pelvic floor laxity with large cystocele identified, image 62 of series 7. Stomach/Bowel: Stomach is normal. Small bowel loops have a normal course and caliber without obstruction. The appendix is not visualized separate from the right lower quadrant bowel loops. There is wall thickening and mild pericolonic fat stranding involving the descending colon and sigmoid colon suspicious for colitis. No complications identified. No pneumatosis, or perforation. No abscess. Vascular/Lymphatic: Aortic atherosclerosis. No aneurysm. No upper abdominal adenopathy. There is no pelvic or inguinal adenopathy. Reproductive: There is significant pelvic floor laxity. The uterus and adnexal structures are otherwise unremarkable. Other: No free fluid or fluid collections identified. Musculoskeletal: No aggressive lytic or sclerotic bone lesions. IMPRESSION: 1. Mild inflammatory changes involving the pancreas compatible with pancreatitis. No complications identified. 2. Multiple gallstones identified. No biliary dilatation and no specific findings to suggest choledocholithiasis. 3. Mild wall thickening involving the descending colon and sigmoid colon concerning for segmental colitis. Likely inflammatory or infectious. 4. Pelvic floor laxity with cystocele. Bilateral hydronephrosis and hydroureter is likely related to cystocele. 5.  Aortic Atherosclerosis  (ICD10-I70.0). Electronically Signed   By: TKerby MoorsM.D.   On: 10/10/2017 20:19   Dg Chest Port 1 View  Result Date: 10/10/2017 CLINICAL DATA:  Chest pain/code stemi EXAM: PORTABLE CHEST 1 VIEW COMPARISON:  10/02/2017 FINDINGS: Normal mediastinum and cardiac silhouette. Normal pulmonary vasculature. No evidence of effusion, infiltrate, or pneumothorax. No acute bony abnormality. IMPRESSION: No acute cardiopulmonary process. Electronically Signed   By: SSuzy BouchardM.D.   On: 10/10/2017 19:38      HBeverely Pace01/19/19 2117    CNat Christen MD 10/14/17 1005

## 2017-10-10 NOTE — ED Triage Notes (Addendum)
To ED for eval of mid-abd pain since yesterday. States the pain moves to her lower abd. Pt was in the hospital last week for kidney infection and low potassium. No vomiting. States her bowel movements have been different since yesterday morning. States she normally will have 6-7 BMs each morning. States she thinks she is passing gas. Denies difficulty with urination. Pt with hx of MI- denies this feeling same. Denies cp but states she did at one point feel her heart racing. No cp now

## 2017-10-10 NOTE — ED Notes (Signed)
Pt back from CT

## 2017-10-10 NOTE — ED Notes (Addendum)
Admitting MD to bedside 

## 2017-10-11 ENCOUNTER — Other Ambulatory Visit: Payer: Self-pay

## 2017-10-11 ENCOUNTER — Encounter (HOSPITAL_COMMUNITY): Payer: Self-pay

## 2017-10-11 DIAGNOSIS — K8080 Other cholelithiasis without obstruction: Secondary | ICD-10-CM

## 2017-10-11 DIAGNOSIS — K851 Biliary acute pancreatitis without necrosis or infection: Secondary | ICD-10-CM

## 2017-10-11 DIAGNOSIS — I21A1 Myocardial infarction type 2: Secondary | ICD-10-CM

## 2017-10-11 LAB — CBC
HCT: 32 % — ABNORMAL LOW (ref 36.0–46.0)
HEMOGLOBIN: 10.2 g/dL — AB (ref 12.0–15.0)
MCH: 25.6 pg — ABNORMAL LOW (ref 26.0–34.0)
MCHC: 31.9 g/dL (ref 30.0–36.0)
MCV: 80.4 fL (ref 78.0–100.0)
Platelets: 310 10*3/uL (ref 150–400)
RBC: 3.98 MIL/uL (ref 3.87–5.11)
RDW: 17.2 % — ABNORMAL HIGH (ref 11.5–15.5)
WBC: 11.8 10*3/uL — AB (ref 4.0–10.5)

## 2017-10-11 LAB — COMPREHENSIVE METABOLIC PANEL
ALBUMIN: 2.7 g/dL — AB (ref 3.5–5.0)
ALT: 40 U/L (ref 14–54)
ANION GAP: 10 (ref 5–15)
AST: 20 U/L (ref 15–41)
Alkaline Phosphatase: 184 U/L — ABNORMAL HIGH (ref 38–126)
BILIRUBIN TOTAL: 0.6 mg/dL (ref 0.3–1.2)
BUN: 10 mg/dL (ref 6–20)
CHLORIDE: 106 mmol/L (ref 101–111)
CO2: 20 mmol/L — ABNORMAL LOW (ref 22–32)
Calcium: 8.3 mg/dL — ABNORMAL LOW (ref 8.9–10.3)
Creatinine, Ser: 0.84 mg/dL (ref 0.44–1.00)
GFR calc Af Amer: >60 mL/min (ref >60)
GFR calc non Af Amer: >60 mL/min (ref >60)
GLUCOSE: 132 mg/dL — AB (ref 65–99)
POTASSIUM: 3.7 mmol/L (ref 3.5–5.1)
Sodium: 136 mmol/L (ref 135–145)
TOTAL PROTEIN: 6.9 g/dL (ref 6.5–8.1)

## 2017-10-11 LAB — TROPONIN I
TROPONIN I: 0.14 ng/mL — AB (ref <0.03)
Troponin I: 0.16 ng/mL (ref <0.03)
Troponin I: 0.18 ng/mL (ref <0.03)

## 2017-10-11 LAB — CBG MONITORING, ED
GLUCOSE-CAPILLARY: 126 mg/dL — AB (ref 65–99)
Glucose-Capillary: 134 mg/dL — ABNORMAL HIGH (ref 65–99)
Glucose-Capillary: 96 mg/dL (ref 65–99)

## 2017-10-11 LAB — GLUCOSE, CAPILLARY
Glucose-Capillary: 84 mg/dL (ref 65–99)
Glucose-Capillary: 89 mg/dL (ref 65–99)
Glucose-Capillary: 94 mg/dL (ref 65–99)

## 2017-10-11 LAB — HEPARIN LEVEL (UNFRACTIONATED): HEPARIN UNFRACTIONATED: 0.16 [IU]/mL — AB (ref 0.30–0.70)

## 2017-10-11 MED ORDER — MORPHINE SULFATE (PF) 4 MG/ML IV SOLN
4.0000 mg | INTRAVENOUS | Status: DC | PRN
  Administered 2017-10-11 – 2017-10-13 (×10): 4 mg via INTRAVENOUS
  Filled 2017-10-11 (×9): qty 1

## 2017-10-11 MED ORDER — HEPARIN BOLUS VIA INFUSION
2000.0000 [IU] | Freq: Once | INTRAVENOUS | Status: AC
  Administered 2017-10-11: 2000 [IU] via INTRAVENOUS
  Filled 2017-10-11: qty 2000

## 2017-10-11 MED ORDER — LACTATED RINGERS IV SOLN
INTRAVENOUS | Status: DC
  Administered 2017-10-11: 1000 mL via INTRAVENOUS
  Administered 2017-10-11: 18:00:00 via INTRAVENOUS
  Administered 2017-10-11: 1000 mL via INTRAVENOUS
  Administered 2017-10-11 – 2017-10-12 (×3): via INTRAVENOUS

## 2017-10-11 NOTE — ED Notes (Signed)
Patient c/o epigastric pain , states pain is totally different from the pain she had when she had an MI

## 2017-10-11 NOTE — Progress Notes (Addendum)
[facility] NIGHT TEAM INTERVAL PROGRESS NOTE  Noted new TWI in leads II, III, aVF on EKG at 3am. Troponin 0.11 -> 0.18 -> 0.18. Went to bedside to evaluate patient. Upon entering the room, patient was sitting up in bed in no acute distress. She endorses continued epigastric pain. Denies chest pain, shortness of breath, or diaphoresis. Stable vital signs. She states that pain medication lasts ~2 hours. On heparin.  GEN: Sitting up in bed in no acute distress CV: NR & RR, no m/r/g ABD: Soft. Mild tenderness to palpation of epigastric area. Non-distended. Decreased bowel sounds  Assessment/Plan Patient has persistent epigastric pain, without chest pain, shortness of breath, or diaphoresis. Vital signs are stable. She is currently on heparin infusion. Will continue monitoring EKGs and adjust pain medication frequency with hold parameters. Discussed with Cardiology, who feel her symptoms are more consistent with demand secondary to pancreatitis rather than ACS/STEMI. Recommended continuing with current plan and Cardiology will re-evaluate in the AM. - Continue EKGs q3h and trending troponins - IV morphine 43m q2h PRN - Asked RN to continue running IVF  JColbert Ewing MD Internal Medicine, PGY-1

## 2017-10-11 NOTE — Progress Notes (Signed)
   Subjective: Victoria Eaton continues to have nonradiating, epigastric abdominal pain. She states that she is not nauseous and is actually feeling like she is a little hungry. She is unaware what is going on at this point. She denies any fevers, chills, worsening of her abdominal pain, nausea/vomiting, diarrhea. We discussed that it looks like she has pancreatitis based on her workup thus far. We believe her pancreatitis is secondary to gallstones. We discussed having surgery come by to evaluate the patient for possible cholecystectomy. She agrees with the plan. All questions and concerns addressed.  Objective: Vital signs in last 24 hours: Vitals:   10/11/17 0245 10/11/17 0300 10/11/17 0315 10/11/17 0400  BP: 138/65 124/61 136/80 134/64  Pulse: 81 78 81 77  Resp: 17 17 16 15   Temp:      TempSrc:      SpO2: 93% 93% 93% 92%   General: Thin female no acute distress Pulm: Good air movement with no wheezing or crackles CV: Regular rate and rhythm, no murmurs, no rubs Abdomen: Hypoactive bowel sounds, soft, nondistended, epigastric tenderness to palpation, no rebound or guarding Extremities: No lower extremity edema  Assessment/Plan:  Victoria Eaton is a 57 year old female with CAD status post drug-eluting stent to the RCA in 2006, hypertension, type 2 diabetes mellitus, uterine prolapse, PUD, and anxiety/depression who presented with epigastric abdominal pain and elevated troponins. Her abdominal pain continues to persist, but she is no longer nauseous. Further management as outlined below.  Pancreatitis - Presented with epigastric pain, lipase 187, and findings on CT abdomen consistent with pancreatitis. - N.p.o. but will advance diet as tolerated - Aggressive IV hydration, transition to lactated Ringer to avoid acidosis. - Continue pain management with morphine 4 mg every 3 hours as needed - Continue IV Zofran for nausea control - Most likely etiology is gallstone pancreatitis, as CT  abdomen illustrated multiple gallstones in the gallbladder, and initial labs illustrated elevated alk phos greater than 200. Will consult surgery for cholecystectomy.  Type II NSTEMI CAD status post drug-eluting stent to the RCA in 2016 - Troponin trend: 0.11 -> 0.18 -> 0.18 -> 0.14 - Initial EKG in the ED illustrating ST elevation of V3 and V4. Most recent EKG showing T wave inversion in inferior leads. - Cardiology has evaluated the patient and started the patient on heparin and morphine. They feel this is most likely due to demand ischemia secondary to her pancreatitis. They will reevaluate her this morning.  Type 2 diabetes mellitus - Home regimen includes glipizide 10 mg daily, sitagliptin 100 mg daily, and dapagliflozoin 10 mg daily  - Sliding scale insulin while n.p.o.  Hypertension - Home regimen includes lisinopril 40 mg daily, metoprolol 25 mg twice daily, and amlodipine 5 mg daily - Holding the lisinopril and amlodipine. Continue metoprolol  Cystocele with bilateral hydronephrosis and hydroureter - Stable  Hypokalemia. Resolved  Dispo: Anticipated discharge pending clinical improvement.   Ina Homes, MD 10/11/2017, 6:08 AM My Pager: [phone]

## 2017-10-11 NOTE — Progress Notes (Signed)
[place] for Heparin  Indication: chest pain/ACS  Allergies  Allergen Reactions  . Kiwi Extract Hives  . Shrimp [Shellfish Allergy] Hives  . Penicillins     Reaction as a child    Vital Signs: Temp: 98.1 F (36.7 C) (01/20 1459) Temp Source: Oral (01/20 1459) BP: 133/64 (01/20 1459) Pulse Rate: 71 (01/20 1459)  Labs: Recent Labs    10/10/17 1753 10/10/17 1848 10/10/17 2359 10/11/17 0328 10/11/17 0605 10/11/17 1618  HGB 11.5*  --   --  10.2*  --   --   HCT 34.7*  --   --  32.0*  --   --   PLT 374  --   --  310  --   --   APTT  --  32  --   --   --   --   LABPROT  --  13.2  --   --   --   --   INR  --  1.01  --   --   --   --   HEPARINUNFRC  --   --   --   --  <0.10* 0.16*  CREATININE 0.93  --   --  0.84  --   --   TROPONINI  --  0.18* 0.18* 0.14*  --   --     Estimated Creatinine Clearance: 74.8 mL/min (by C-G formula based on SCr of 0.84 mg/dL).   Medical History: Past Medical History:  Diagnosis Date  . Allergy   . Anxiety   . Arthritis   . Bladder prolapse, female, acquired 09/14/2017  . Depression   . Diabetes mellitus without complication ([place])   . History of stomach ulcers   . Hypertension   . Myocardial infarction ([place]) 12/31/2004   Assessment: 56 y/of continuing on heparin per pharmacy for ACS. Hg down to 10.2, ptl wnl, no bleed or IV line issues per RN. Not on anticoagulation PTA.   Goal of Therapy:  Heparin level 0.3-0.7 units/ml Monitor platelets by anticoagulation protocol: Yes   Plan:  -Increase heparin drip to 1250 units/hr -6h heparin level -Daily CBC/heparin level -Monitor for bleeding -F/u Cardiology plans  Elicia Lamp, PharmD, BCPS Clinical Pharmacist 10/11/2017 5:35 PM

## 2017-10-11 NOTE — H&P (Signed)
CC: MEG pain, consult by Dr. Tarri Abernethy  HPI: Victoria Eaton is an 57 y.o. female here for 1d hx of MEG burning abdominal pain. Pain is constant and nonradiating. Never had this before. Nothing makes pain better or worse. +Nausea. No emesis.  AF VS normal Mildly ttp in MEG  WBC 11.8 LFTs - normal, alk phos 184, tbili 0.6 Lipase 187  Troponin in ED 0.18-->0.14  CT done in ED showed gallstones, normal caliber CBD, and inflammation around the pancreas. Additionally showed segmental colitis in descending and sigmoid colon as well as hydroureter related to pelvic organ prolapse  Cardiology has been following and started her on heparin gtt  Hx of CAD/DES 2006; HTN, HLD, DM (well controlled on oral hypoglycemics), PUD  Past Medical History:  Diagnosis Date  . Allergy   . Anxiety   . Arthritis   . Bladder prolapse, female, acquired 09/14/2017  . Depression   . Diabetes mellitus without complication ([place])   . History of stomach ulcers   . Hypertension   . Myocardial infarction ([place]) 12/31/2004    Past Surgical History:  Procedure Laterality Date  . CORONARY ANGIOPLASTY WITH STENT PLACEMENT  12/30/2004   Patient reported    Family History  Problem Relation Age of Onset  . Heart disease Father   . Hyperlipidemia Father   . Hypertension Father     Social:  reports that she has been smoking cigarettes.  She started smoking about 45 years ago. She has a 22.50 pack-year smoking history. she has never used smokeless tobacco. She reports that she drinks about 12.0 oz of alcohol per week. She reports that she does not use drugs.  Allergies:  Allergies  Allergen Reactions  . Kiwi Extract Hives  . Shrimp [Shellfish Allergy] Hives  . Penicillins     Reaction as a child     Medications: I have reviewed the patient's current medications.  Results for orders placed or performed during the hospital encounter of 10/10/17 (from the past 48 hour(s))  Lipase, blood     Status: Abnormal    Collection Time: 10/10/17  5:53 PM  Result Value Ref Range   Lipase 187 (H) 11 - 51 U/L  Comprehensive metabolic panel     Status: Abnormal   Collection Time: 10/10/17  5:53 PM  Result Value Ref Range   Sodium 131 (L) 135 - 145 mmol/L   Potassium 3.4 (L) 3.5 - 5.1 mmol/L   Chloride 98 (L) 101 - 111 mmol/L   CO2 22 22 - 32 mmol/L   Glucose, Bld 254 (H) 65 - 99 mg/dL   BUN 14 6 - 20 mg/dL   Creatinine, Ser 0.93 0.44 - 1.00 mg/dL   Calcium 9.6 8.9 - 10.3 mg/dL   Total Protein 7.8 6.5 - 8.1 g/dL   Albumin 3.1 (L) 3.5 - 5.0 g/dL   AST 26 15 - 41 U/L   ALT 49 14 - 54 U/L   Alkaline Phosphatase 231 (H) 38 - 126 U/L   Total Bilirubin 0.6 0.3 - 1.2 mg/dL   GFR calc non Af Amer >60 >60 mL/min   GFR calc Af Amer >60 >60 mL/min    Comment: (NOTE) The eGFR has been calculated using the CKD EPI equation. This calculation has not been validated in all clinical situations. eGFR's persistently <60 mL/min signify possible Chronic Kidney Disease.    Anion gap 11 5 - 15  CBC     Status: Abnormal   Collection Time: 10/10/17  5:53 PM  Result Value Ref Range   WBC 11.3 (H) 4.0 - 10.5 K/uL   RBC 4.36 3.87 - 5.11 MIL/uL   Hemoglobin 11.5 (L) 12.0 - 15.0 g/dL   HCT 34.7 (L) 36.0 - 46.0 %   MCV 79.6 78.0 - 100.0 fL   MCH 26.4 26.0 - 34.0 pg   MCHC 33.1 30.0 - 36.0 g/dL   RDW 17.0 (H) 11.5 - 15.5 %   Platelets 374 150 - 400 K/uL  Urinalysis, Routine w reflex microscopic     Status: Abnormal   Collection Time: 10/10/17  5:57 PM  Result Value Ref Range   Color, Urine YELLOW YELLOW   APPearance HAZY (A) CLEAR   Specific Gravity, Urine 1.025 1.005 - 1.030   pH 6.0 5.0 - 8.0   Glucose, UA >=500 (A) NEGATIVE mg/dL   Hgb urine dipstick NEGATIVE NEGATIVE   Bilirubin Urine NEGATIVE NEGATIVE   Ketones, ur NEGATIVE NEGATIVE mg/dL   Protein, ur 100 (A) NEGATIVE mg/dL   Nitrite NEGATIVE NEGATIVE   Leukocytes, UA NEGATIVE NEGATIVE   RBC / HPF 0-5 0 - 5 RBC/hpf   WBC, UA 0-5 0 - 5 WBC/hpf    Bacteria, UA RARE (A) NONE SEEN   Squamous Epithelial / LPF 0-5 (A) NONE SEEN  I-Stat Troponin, ED (not at [facility])     Status: Abnormal   Collection Time: 10/10/17  6:28 PM  Result Value Ref Range   Troponin i, poc 0.11 (HH) 0.00 - 0.08 ng/mL   Comment NOTIFIED PHYSICIAN    Comment 3            Comment: Due to the release kinetics of cTnI, a negative result within the first hours of the onset of symptoms does not rule out myocardial infarction with certainty. If myocardial infarction is still suspected, repeat the test at appropriate intervals.   Protime-INR     Status: None   Collection Time: 10/10/17  6:48 PM  Result Value Ref Range   Prothrombin Time 13.2 11.4 - 15.2 seconds   INR 1.01   APTT     Status: None   Collection Time: 10/10/17  6:48 PM  Result Value Ref Range   aPTT 32 24 - 36 seconds  Troponin I     Status: Abnormal   Collection Time: 10/10/17  6:48 PM  Result Value Ref Range   Troponin I 0.18 (HH) <0.03 ng/mL    Comment: CRITICAL RESULT CALLED TO, READ BACK BY AND VERIFIED WITH: PELKEY T,RN 2005 10/10/17 THOMPSON V   Lipid panel     Status: Abnormal   Collection Time: 10/10/17  6:48 PM  Result Value Ref Range   Cholesterol 137 0 - 200 mg/dL   Triglycerides 144 <150 mg/dL   HDL 38 (L) >40 mg/dL   Total CHOL/HDL Ratio 3.6 RATIO   VLDL 29 0 - 40 mg/dL   LDL Cholesterol 70 0 - 99 mg/dL    Comment:        Total Cholesterol/HDL:CHD Risk Coronary Heart Disease Risk Table                     Men   Women  1/2 Average Risk   3.4   3.3  Average Risk       5.0   4.4  2 X Average Risk   9.6   7.1  3 X Average Risk  23.4   11.0        Use the calculated Patient Ratio above and  the CHD Risk Table to determine the patient's CHD Risk.        ATP III CLASSIFICATION (LDL):  <100     mg/dL   Optimal  100-129  mg/dL   Near or Above                    Optimal  130-159  mg/dL   Borderline  160-189  mg/dL   High  >190     mg/dL   Very High   I-Stat beta hCG blood, ED      Status: None   Collection Time: 10/10/17  7:04 PM  Result Value Ref Range   I-stat hCG, quantitative <5.0 <5 mIU/mL   Comment 3            Comment:   GEST. AGE      CONC.  (mIU/mL)   <=1 WEEK        5 - 50     2 WEEKS       50 - 500     3 WEEKS       100 - 10,000     4 WEEKS     1,000 - 30,000        FEMALE AND NON-PREGNANT FEMALE:     LESS THAN 5 mIU/mL   Troponin I (q 6hr x 3)     Status: Abnormal   Collection Time: 10/10/17 11:59 PM  Result Value Ref Range   Troponin I 0.18 (HH) <0.03 ng/mL    Comment: CRITICAL VALUE NOTED.  VALUE IS CONSISTENT WITH PREVIOUSLY REPORTED AND CALLED VALUE.  Troponin I (q 6hr x 3)     Status: Abnormal   Collection Time: 10/11/17  3:28 AM  Result Value Ref Range   Troponin I 0.14 (HH) <0.03 ng/mL    Comment: CRITICAL VALUE NOTED.  VALUE IS CONSISTENT WITH PREVIOUSLY REPORTED AND CALLED VALUE.  Comprehensive metabolic panel     Status: Abnormal   Collection Time: 10/11/17  3:28 AM  Result Value Ref Range   Sodium 136 135 - 145 mmol/L   Potassium 3.7 3.5 - 5.1 mmol/L   Chloride 106 101 - 111 mmol/L   CO2 20 (L) 22 - 32 mmol/L   Glucose, Bld 132 (H) 65 - 99 mg/dL   BUN 10 6 - 20 mg/dL   Creatinine, Ser 0.84 0.44 - 1.00 mg/dL   Calcium 8.3 (L) 8.9 - 10.3 mg/dL   Total Protein 6.9 6.5 - 8.1 g/dL   Albumin 2.7 (L) 3.5 - 5.0 g/dL   AST 20 15 - 41 U/L   ALT 40 14 - 54 U/L   Alkaline Phosphatase 184 (H) 38 - 126 U/L   Total Bilirubin 0.6 0.3 - 1.2 mg/dL   GFR calc non Af Amer >60 >60 mL/min   GFR calc Af Amer >60 >60 mL/min    Comment: (NOTE) The eGFR has been calculated using the CKD EPI equation. This calculation has not been validated in all clinical situations. eGFR's persistently <60 mL/min signify possible Chronic Kidney Disease.    Anion gap 10 5 - 15  CBC     Status: Abnormal   Collection Time: 10/11/17  3:28 AM  Result Value Ref Range   WBC 11.8 (H) 4.0 - 10.5 K/uL   RBC 3.98 3.87 - 5.11 MIL/uL   Hemoglobin 10.2 (L) 12.0 - 15.0  g/dL   HCT 32.0 (L) 36.0 - 46.0 %   MCV 80.4 78.0 - 100.0 fL  MCH 25.6 (L) 26.0 - 34.0 pg   MCHC 31.9 30.0 - 36.0 g/dL   RDW 17.2 (H) 11.5 - 15.5 %   Platelets 310 150 - 400 K/uL  CBG monitoring, ED     Status: Abnormal   Collection Time: 10/11/17  3:36 AM  Result Value Ref Range   Glucose-Capillary 126 (H) 65 - 99 mg/dL  Heparin level (unfractionated)     Status: Abnormal   Collection Time: 10/11/17  6:05 AM  Result Value Ref Range   Heparin Unfractionated <0.10 (L) 0.30 - 0.70 IU/mL    Comment:        IF HEPARIN RESULTS ARE BELOW EXPECTED VALUES, AND PATIENT DOSAGE HAS BEEN CONFIRMED, SUGGEST FOLLOW UP TESTING OF ANTITHROMBIN III LEVELS.   CBG monitoring, ED     Status: Abnormal   Collection Time: 10/11/17  7:45 AM  Result Value Ref Range   Glucose-Capillary 134 (H) 65 - 99 mg/dL    Ct Abdomen Pelvis W Contrast  Result Date: 10/10/2017 CLINICAL DATA:  Pancreatitis. EXAM: CT ABDOMEN AND PELVIS WITH CONTRAST TECHNIQUE: Multidetector CT imaging of the abdomen and pelvis was performed using the standard protocol following bolus administration of intravenous contrast. CONTRAST:  133m ISOVUE-300 IOPAMIDOL (ISOVUE-300) INJECTION 61% COMPARISON:  06/21/2004 FINDINGS: Lower chest: No acute abnormality. Hepatobiliary: No suspicious liver abnormalities. Large cluster of stones within the dependent portion of the gallbladder measures 1.8 cm. No intrahepatic biliary dilatation. No common bile duct dilatation. Pancreas: Mild peripancreatic fat stranding identified compatible with acute pancreatitis. No pancreatic duct dilatation, pancreatic necrosis or pseudocyst. Spleen: Normal in size without focal abnormality. Adrenals/Urinary Tract: The adrenal glands are normal. Bilateral hydronephrosis identified. Bilateral hydroureter is also noted. Pelvic floor laxity with large cystocele identified, image 62 of series 7. Stomach/Bowel: Stomach is normal. Small bowel loops have a normal course and caliber  without obstruction. The appendix is not visualized separate from the right lower quadrant bowel loops. There is wall thickening and mild pericolonic fat stranding involving the descending colon and sigmoid colon suspicious for colitis. No complications identified. No pneumatosis, or perforation. No abscess. Vascular/Lymphatic: Aortic atherosclerosis. No aneurysm. No upper abdominal adenopathy. There is no pelvic or inguinal adenopathy. Reproductive: There is significant pelvic floor laxity. The uterus and adnexal structures are otherwise unremarkable. Other: No free fluid or fluid collections identified. Musculoskeletal: No aggressive lytic or sclerotic bone lesions. IMPRESSION: 1. Mild inflammatory changes involving the pancreas compatible with pancreatitis. No complications identified. 2. Multiple gallstones identified. No biliary dilatation and no specific findings to suggest choledocholithiasis. 3. Mild wall thickening involving the descending colon and sigmoid colon concerning for segmental colitis. Likely inflammatory or infectious. 4. Pelvic floor laxity with cystocele. Bilateral hydronephrosis and hydroureter is likely related to cystocele. 5.  Aortic Atherosclerosis (ICD10-I70.0). Electronically Signed   By: TKerby MoorsM.D.   On: 10/10/2017 20:19   Dg Chest Port 1 View  Result Date: 10/10/2017 CLINICAL DATA:  Chest pain/code stemi EXAM: PORTABLE CHEST 1 VIEW COMPARISON:  10/02/2017 FINDINGS: Normal mediastinum and cardiac silhouette. Normal pulmonary vasculature. No evidence of effusion, infiltrate, or pneumothorax. No acute bony abnormality. IMPRESSION: No acute cardiopulmonary process. Electronically Signed   By: SSuzy BouchardM.D.   On: 10/10/2017 19:38    ROS - all of the below systems have been reviewed with the patient and positives are indicated with bold text General: chills, fever or night sweats Eyes: blurry vision or double vision ENT: epistaxis or sore  throat Allergy/Immunology: itchy/watery eyes or nasal congestion Hematologic/Lymphatic:  bleeding problems, blood clots or swollen lymph nodes Endocrine: temperature intolerance or unexpected weight changes Breast: new or changing breast lumps or nipple discharge Resp: cough, shortness of breath, or wheezing CV: chest pain or dyspnea on exertion GI: as per HPI GU: dysuria, trouble voiding, or hematuria MSK: joint pain or joint stiffness Neuro: TIA or stroke symptoms Derm: pruritus and skin lesion changes Psych: anxiety and depression  PE Blood pressure (!) 158/70, pulse 84, temperature 98.2 F (36.8 C), temperature source Oral, resp. rate 18, SpO2 98 %. Constitutional: NAD; conversant; no deformities Eyes: Moist conjunctiva; no lid lag; anicteric; PERRL Neck: Trachea midline; no thyromegaly Lungs: Normal respiratory effort; no tactile fremitus CV: RRR; no palpable thrills; no pitting edema GI: Abd soft, mildly ttp in MEG; negative Murphy's sign; no palpable hepatosplenomegaly MSK: no clubbing/cyanosis Psychiatric: Appropriate affect; alert and oriented x3 Lymphatic: No palpable cervical or axillary lymphadenopathy  Results for orders placed or performed during the hospital encounter of 10/10/17 (from the past 48 hour(s))  Lipase, blood     Status: Abnormal   Collection Time: 10/10/17  5:53 PM  Result Value Ref Range   Lipase 187 (H) 11 - 51 U/L  Comprehensive metabolic panel     Status: Abnormal   Collection Time: 10/10/17  5:53 PM  Result Value Ref Range   Sodium 131 (L) 135 - 145 mmol/L   Potassium 3.4 (L) 3.5 - 5.1 mmol/L   Chloride 98 (L) 101 - 111 mmol/L   CO2 22 22 - 32 mmol/L   Glucose, Bld 254 (H) 65 - 99 mg/dL   BUN 14 6 - 20 mg/dL   Creatinine, Ser 0.93 0.44 - 1.00 mg/dL   Calcium 9.6 8.9 - 10.3 mg/dL   Total Protein 7.8 6.5 - 8.1 g/dL   Albumin 3.1 (L) 3.5 - 5.0 g/dL   AST 26 15 - 41 U/L   ALT 49 14 - 54 U/L   Alkaline Phosphatase 231 (H) 38 - 126 U/L   Total  Bilirubin 0.6 0.3 - 1.2 mg/dL   GFR calc non Af Amer >60 >60 mL/min   GFR calc Af Amer >60 >60 mL/min    Comment: (NOTE) The eGFR has been calculated using the CKD EPI equation. This calculation has not been validated in all clinical situations. eGFR's persistently <60 mL/min signify possible Chronic Kidney Disease.    Anion gap 11 5 - 15  CBC     Status: Abnormal   Collection Time: 10/10/17  5:53 PM  Result Value Ref Range   WBC 11.3 (H) 4.0 - 10.5 K/uL   RBC 4.36 3.87 - 5.11 MIL/uL   Hemoglobin 11.5 (L) 12.0 - 15.0 g/dL   HCT 34.7 (L) 36.0 - 46.0 %   MCV 79.6 78.0 - 100.0 fL   MCH 26.4 26.0 - 34.0 pg   MCHC 33.1 30.0 - 36.0 g/dL   RDW 17.0 (H) 11.5 - 15.5 %   Platelets 374 150 - 400 K/uL  Urinalysis, Routine w reflex microscopic     Status: Abnormal   Collection Time: 10/10/17  5:57 PM  Result Value Ref Range   Color, Urine YELLOW YELLOW   APPearance HAZY (A) CLEAR   Specific Gravity, Urine 1.025 1.005 - 1.030   pH 6.0 5.0 - 8.0   Glucose, UA >=500 (A) NEGATIVE mg/dL   Hgb urine dipstick NEGATIVE NEGATIVE   Bilirubin Urine NEGATIVE NEGATIVE   Ketones, ur NEGATIVE NEGATIVE mg/dL   Protein, ur 100 (A) NEGATIVE mg/dL  Nitrite NEGATIVE NEGATIVE   Leukocytes, UA NEGATIVE NEGATIVE   RBC / HPF 0-5 0 - 5 RBC/hpf   WBC, UA 0-5 0 - 5 WBC/hpf   Bacteria, UA RARE (A) NONE SEEN   Squamous Epithelial / LPF 0-5 (A) NONE SEEN  I-Stat Troponin, ED (not at [facility])     Status: Abnormal   Collection Time: 10/10/17  6:28 PM  Result Value Ref Range   Troponin i, poc 0.11 (HH) 0.00 - 0.08 ng/mL   Comment NOTIFIED PHYSICIAN    Comment 3            Comment: Due to the release kinetics of cTnI, a negative result within the first hours of the onset of symptoms does not rule out myocardial infarction with certainty. If myocardial infarction is still suspected, repeat the test at appropriate intervals.   Protime-INR     Status: None   Collection Time: 10/10/17  6:48 PM  Result Value Ref  Range   Prothrombin Time 13.2 11.4 - 15.2 seconds   INR 1.01   APTT     Status: None   Collection Time: 10/10/17  6:48 PM  Result Value Ref Range   aPTT 32 24 - 36 seconds  Troponin I     Status: Abnormal   Collection Time: 10/10/17  6:48 PM  Result Value Ref Range   Troponin I 0.18 (HH) <0.03 ng/mL    Comment: CRITICAL RESULT CALLED TO, READ BACK BY AND VERIFIED WITH: PELKEY T,RN 2005 10/10/17 THOMPSON V   Lipid panel     Status: Abnormal   Collection Time: 10/10/17  6:48 PM  Result Value Ref Range   Cholesterol 137 0 - 200 mg/dL   Triglycerides 144 <150 mg/dL   HDL 38 (L) >40 mg/dL   Total CHOL/HDL Ratio 3.6 RATIO   VLDL 29 0 - 40 mg/dL   LDL Cholesterol 70 0 - 99 mg/dL    Comment:        Total Cholesterol/HDL:CHD Risk Coronary Heart Disease Risk Table                     Men   Women  1/2 Average Risk   3.4   3.3  Average Risk       5.0   4.4  2 X Average Risk   9.6   7.1  3 X Average Risk  23.4   11.0        Use the calculated Patient Ratio above and the CHD Risk Table to determine the patient's CHD Risk.        ATP III CLASSIFICATION (LDL):  <100     mg/dL   Optimal  100-129  mg/dL   Near or Above                    Optimal  130-159  mg/dL   Borderline  160-189  mg/dL   High  >190     mg/dL   Very High   I-Stat beta hCG blood, ED     Status: None   Collection Time: 10/10/17  7:04 PM  Result Value Ref Range   I-stat hCG, quantitative <5.0 <5 mIU/mL   Comment 3            Comment:   GEST. AGE      CONC.  (mIU/mL)   <=1 WEEK        5 - 50     2 WEEKS  50 - 500     3 WEEKS       100 - 10,000     4 WEEKS     1,000 - 30,000        FEMALE AND NON-PREGNANT FEMALE:     LESS THAN 5 mIU/mL   Troponin I (q 6hr x 3)     Status: Abnormal   Collection Time: 10/10/17 11:59 PM  Result Value Ref Range   Troponin I 0.18 (HH) <0.03 ng/mL    Comment: CRITICAL VALUE NOTED.  VALUE IS CONSISTENT WITH PREVIOUSLY REPORTED AND CALLED VALUE.  Troponin I (q 6hr x 3)      Status: Abnormal   Collection Time: 10/11/17  3:28 AM  Result Value Ref Range   Troponin I 0.14 (HH) <0.03 ng/mL    Comment: CRITICAL VALUE NOTED.  VALUE IS CONSISTENT WITH PREVIOUSLY REPORTED AND CALLED VALUE.  Comprehensive metabolic panel     Status: Abnormal   Collection Time: 10/11/17  3:28 AM  Result Value Ref Range   Sodium 136 135 - 145 mmol/L   Potassium 3.7 3.5 - 5.1 mmol/L   Chloride 106 101 - 111 mmol/L   CO2 20 (L) 22 - 32 mmol/L   Glucose, Bld 132 (H) 65 - 99 mg/dL   BUN 10 6 - 20 mg/dL   Creatinine, Ser 0.84 0.44 - 1.00 mg/dL   Calcium 8.3 (L) 8.9 - 10.3 mg/dL   Total Protein 6.9 6.5 - 8.1 g/dL   Albumin 2.7 (L) 3.5 - 5.0 g/dL   AST 20 15 - 41 U/L   ALT 40 14 - 54 U/L   Alkaline Phosphatase 184 (H) 38 - 126 U/L   Total Bilirubin 0.6 0.3 - 1.2 mg/dL   GFR calc non Af Amer >60 >60 mL/min   GFR calc Af Amer >60 >60 mL/min    Comment: (NOTE) The eGFR has been calculated using the CKD EPI equation. This calculation has not been validated in all clinical situations. eGFR's persistently <60 mL/min signify possible Chronic Kidney Disease.    Anion gap 10 5 - 15  CBC     Status: Abnormal   Collection Time: 10/11/17  3:28 AM  Result Value Ref Range   WBC 11.8 (H) 4.0 - 10.5 K/uL   RBC 3.98 3.87 - 5.11 MIL/uL   Hemoglobin 10.2 (L) 12.0 - 15.0 g/dL   HCT 32.0 (L) 36.0 - 46.0 %   MCV 80.4 78.0 - 100.0 fL   MCH 25.6 (L) 26.0 - 34.0 pg   MCHC 31.9 30.0 - 36.0 g/dL   RDW 17.2 (H) 11.5 - 15.5 %   Platelets 310 150 - 400 K/uL  CBG monitoring, ED     Status: Abnormal   Collection Time: 10/11/17  3:36 AM  Result Value Ref Range   Glucose-Capillary 126 (H) 65 - 99 mg/dL  Heparin level (unfractionated)     Status: Abnormal   Collection Time: 10/11/17  6:05 AM  Result Value Ref Range   Heparin Unfractionated <0.10 (L) 0.30 - 0.70 IU/mL    Comment:        IF HEPARIN RESULTS ARE BELOW EXPECTED VALUES, AND PATIENT DOSAGE HAS BEEN CONFIRMED, SUGGEST FOLLOW UP TESTING OF  ANTITHROMBIN III LEVELS.   CBG monitoring, ED     Status: Abnormal   Collection Time: 10/11/17  7:45 AM  Result Value Ref Range   Glucose-Capillary 134 (H) 65 - 99 mg/dL    Ct Abdomen Pelvis W Contrast  Result Date:  10/10/2017 CLINICAL DATA:  Pancreatitis. EXAM: CT ABDOMEN AND PELVIS WITH CONTRAST TECHNIQUE: Multidetector CT imaging of the abdomen and pelvis was performed using the standard protocol following bolus administration of intravenous contrast. CONTRAST:  170m ISOVUE-300 IOPAMIDOL (ISOVUE-300) INJECTION 61% COMPARISON:  06/21/2004 FINDINGS: Lower chest: No acute abnormality. Hepatobiliary: No suspicious liver abnormalities. Large cluster of stones within the dependent portion of the gallbladder measures 1.8 cm. No intrahepatic biliary dilatation. No common bile duct dilatation. Pancreas: Mild peripancreatic fat stranding identified compatible with acute pancreatitis. No pancreatic duct dilatation, pancreatic necrosis or pseudocyst. Spleen: Normal in size without focal abnormality. Adrenals/Urinary Tract: The adrenal glands are normal. Bilateral hydronephrosis identified. Bilateral hydroureter is also noted. Pelvic floor laxity with large cystocele identified, image 62 of series 7. Stomach/Bowel: Stomach is normal. Small bowel loops have a normal course and caliber without obstruction. The appendix is not visualized separate from the right lower quadrant bowel loops. There is wall thickening and mild pericolonic fat stranding involving the descending colon and sigmoid colon suspicious for colitis. No complications identified. No pneumatosis, or perforation. No abscess. Vascular/Lymphatic: Aortic atherosclerosis. No aneurysm. No upper abdominal adenopathy. There is no pelvic or inguinal adenopathy. Reproductive: There is significant pelvic floor laxity. The uterus and adnexal structures are otherwise unremarkable. Other: No free fluid or fluid collections identified. Musculoskeletal: No aggressive  lytic or sclerotic bone lesions. IMPRESSION: 1. Mild inflammatory changes involving the pancreas compatible with pancreatitis. No complications identified. 2. Multiple gallstones identified. No biliary dilatation and no specific findings to suggest choledocholithiasis. 3. Mild wall thickening involving the descending colon and sigmoid colon concerning for segmental colitis. Likely inflammatory or infectious. 4. Pelvic floor laxity with cystocele. Bilateral hydronephrosis and hydroureter is likely related to cystocele. 5.  Aortic Atherosclerosis (ICD10-I70.0). Electronically Signed   By: TKerby MoorsM.D.   On: 10/10/2017 20:19   Dg Chest Port 1 View  Result Date: 10/10/2017 CLINICAL DATA:  Chest pain/code stemi EXAM: PORTABLE CHEST 1 VIEW COMPARISON:  10/02/2017 FINDINGS: Normal mediastinum and cardiac silhouette. Normal pulmonary vasculature. No evidence of effusion, infiltrate, or pneumothorax. No acute bony abnormality. IMPRESSION: No acute cardiopulmonary process. Electronically Signed   By: SSuzy BouchardM.D.   On: 10/10/2017 19:38   A/P: CShanara Schniedersis an 57y.o. female with biliary pancreatitis - also troponemia undergoing evaluation by cardiology - on heparin gtt at the momen  -NPO, IVF -Trend labs -Will plan cholecystectomy this admission if cleared by cardiology but not until her cardiac issues have resolved and they feel it's safe to proceed -All of this was discussed with the patient and her daughter  CSharon Mt WDema Severin M.D. [facility]

## 2017-10-11 NOTE — Progress Notes (Signed)
Troponin mildly elevated and declining - symptoms more consistent with pancreatitis. Awaiting echo for LVEF and wall motion. May need additional ischemia evaluation before considering cholecystectomy such as stress testing early next week. Will follow-up after her echo is reviewed.  Pixie Casino, MD, [facility], [place] Director of the Advanced Lipid Disorders &  Cardiovascular Risk Reduction Clinic Diplomate of the American Board of Clinical Lipidology Attending Cardiologist  Direct Dial: [phone]  Fax: [phone]  Website:  [email or web address]

## 2017-10-12 ENCOUNTER — Inpatient Hospital Stay (HOSPITAL_COMMUNITY): Payer: BLUE CROSS/BLUE SHIELD

## 2017-10-12 ENCOUNTER — Telehealth: Payer: Self-pay

## 2017-10-12 DIAGNOSIS — I340 Nonrheumatic mitral (valve) insufficiency: Secondary | ICD-10-CM

## 2017-10-12 DIAGNOSIS — Z01810 Encounter for preprocedural cardiovascular examination: Secondary | ICD-10-CM

## 2017-10-12 DIAGNOSIS — I25758 Atherosclerosis of native coronary artery of transplanted heart with other forms of angina pectoris: Secondary | ICD-10-CM

## 2017-10-12 LAB — COMPREHENSIVE METABOLIC PANEL
ALK PHOS: 153 U/L — AB (ref 38–126)
ALT: 32 U/L (ref 14–54)
ANION GAP: 10 (ref 5–15)
AST: 20 U/L (ref 15–41)
Albumin: 2.5 g/dL — ABNORMAL LOW (ref 3.5–5.0)
BUN: 6 mg/dL (ref 6–20)
CALCIUM: 8.4 mg/dL — AB (ref 8.9–10.3)
CO2: 22 mmol/L (ref 22–32)
CREATININE: 0.88 mg/dL (ref 0.44–1.00)
Chloride: 107 mmol/L (ref 101–111)
GFR calc non Af Amer: >60 mL/min (ref >60)
Glucose, Bld: 85 mg/dL (ref 65–99)
Potassium: 3.8 mmol/L (ref 3.5–5.1)
SODIUM: 139 mmol/L (ref 135–145)
Total Bilirubin: 0.9 mg/dL (ref 0.3–1.2)
Total Protein: 6.1 g/dL — ABNORMAL LOW (ref 6.5–8.1)

## 2017-10-12 LAB — GLUCOSE, CAPILLARY
GLUCOSE-CAPILLARY: 95 mg/dL (ref 65–99)
GLUCOSE-CAPILLARY: 84 mg/dL (ref 65–99)
GLUCOSE-CAPILLARY: 153 mg/dL — AB (ref 65–99)
GLUCOSE-CAPILLARY: 188 mg/dL — AB (ref 65–99)
Glucose-Capillary: 179 mg/dL — ABNORMAL HIGH (ref 65–99)

## 2017-10-12 LAB — ECHOCARDIOGRAM COMPLETE
CHL CUP DOP CALC LVOT VTI: 17.2 cm
CHL CUP RV SYS PRESS: 22 mmHg
CHL CUP TV REG PEAK VELOCITY: 220 cm/s
E decel time: 165 msec
E/e' ratio: 7.61
FS: 20 % — AB (ref 28–44)
HEIGHTINCHES: 66 in
IVS/LV PW RATIO, ED: 1.08
LA ID, A-P, ES: 40 mm
LA diam index: 2.19 cm/m2
LA vol A4C: 51.8 ml
LA vol index: 27.8 mL/m2
LAVOL: 50.9 mL
LDCA: 3.46 cm2
LEFT ATRIUM END SYS DIAM: 40 mm
LV E/e'average: 7.61
LV PW d: 13 mm — AB (ref 0.6–1.1)
LV TDI E'LATERAL: 10.4
LV TDI E'MEDIAL: 6.2
LVEEMED: 7.61
LVELAT: 10.4 cm/s
LVOT diameter: 21 mm
LVOT peak grad rest: 3 mmHg
LVOTPV: 83.5 cm/s
LVOTSV: 60 mL
Lateral S' vel: 14.5 cm/s
MV Dec: 165
MV pk E vel: 79.1 m/s
MVPG: 3 mmHg
MVPKAVEL: 88.8 m/s
TAPSE: 33 mm
TR max vel: 220 cm/s
WEIGHTICAEL: 2585.55 [oz_av]

## 2017-10-12 LAB — CBC
HCT: 30 % — ABNORMAL LOW (ref 36.0–46.0)
HEMOGLOBIN: 9.5 g/dL — AB (ref 12.0–15.0)
MCH: 26 pg (ref 26.0–34.0)
MCHC: 31.7 g/dL (ref 30.0–36.0)
MCV: 82 fL (ref 78.0–100.0)
Platelets: 277 10*3/uL (ref 150–400)
RBC: 3.66 MIL/uL — AB (ref 3.87–5.11)
RDW: 17.7 % — ABNORMAL HIGH (ref 11.5–15.5)
WBC: 9.5 10*3/uL (ref 4.0–10.5)

## 2017-10-12 LAB — HEPARIN LEVEL (UNFRACTIONATED)
HEPARIN UNFRACTIONATED: 0.21 [IU]/mL — AB (ref 0.30–0.70)
Heparin Unfractionated: 0.21 IU/mL — ABNORMAL LOW (ref 0.30–0.70)
Heparin Unfractionated: 0.37 IU/mL (ref 0.30–0.70)

## 2017-10-12 MED ORDER — LACTATED RINGERS IV SOLN
INTRAVENOUS | Status: DC
  Administered 2017-10-12: 10:00:00 via INTRAVENOUS

## 2017-10-12 NOTE — Progress Notes (Signed)
[place] for Heparin  Indication: chest pain/ACS  Allergies  Allergen Reactions  . Kiwi Extract Hives  . Shrimp [Shellfish Allergy] Hives  . Penicillins     Reaction as a child    Vital Signs: Temp: 98.1 F (36.7 C) (01/21 0603) Temp Source: Oral (01/21 0603) BP: 156/65 (01/21 0955) Pulse Rate: 83 (01/21 0603)  Labs: Recent Labs    10/10/17 1753  10/10/17 1848 10/10/17 2359 10/11/17 0328  10/11/17 1618 10/12/17 0030 10/12/17 0958  HGB 11.5*  --   --   --  10.2*  --   --  9.5*  --   HCT 34.7*  --   --   --  32.0*  --   --  30.0*  --   PLT 374  --   --   --  310  --   --  277  --   APTT  --   --  32  --   --   --   --   --   --   LABPROT  --   --  13.2  --   --   --   --   --   --   INR  --   --  1.01  --   --   --   --   --   --   HEPARINUNFRC  --   --   --   --   --    < > 0.16* 0.21* 0.21*  CREATININE 0.93  --   --   --  0.84  --   --  0.88  --   TROPONINI  --    < > 0.18* 0.18* 0.14*  --  0.16*  --   --    < > = values in this interval not displayed.    Estimated Creatinine Clearance: 71.4 mL/min (by C-G formula based on SCr of 0.88 mg/dL).  Assessment: 56 y/of continuing on heparin per pharmacy for ACS. Heparin level 0.21 on 1400 units/hr, remains sub-therapeutic despite rate increase. No interruption with infusion per RN. hgb 9.5, pltc 277K, stable. No bleeding noted per chart.   Goal of Therapy:  Heparin level 0.3-0.7 units/ml Monitor platelets by anticoagulation protocol: Yes   Plan:  -Increase heparin drip to 1550 units/hr -f/u 6 hr heparin level at 1830 -Daily CBC/heparin level -Monitor for bleeding, trend Hgb -F/u Cardiology plans  Maryanna Shape, PharmD, BCPS  Clinical Pharmacist  Pager: [phone]

## 2017-10-12 NOTE — Progress Notes (Signed)
   Subjective: Patient is doing well this morning. She states that her epigastric abdominal pain has resolved; however, she is having left flank pain. She would like to get everything going and would like to start eating.  We discussed that we need to take things easy, and slowly progress her diet.  She is very nervous about getting a cholecystectomy and states that she has never had surgery before.  We briefly discussed some of the risks and benefits and I encouraged her to discuss it with surgery.  We discussed that the plan today will be to get an echocardiogram, and followed by possible stress test.  We will allow her to have clear liquids today.  She was advised that if she begins to have recurrent abdominal pain or nausea to stop and let us know.  All questions and concerns addressed.  Objective: Vital signs in last 24 hours: Vitals:   10/11/17 1459 10/11/17 1700 10/11/17 2144 10/12/17 0022  BP: 133/64  (!) 159/66 (!) 145/74  Pulse: 71  85 75  Resp: 18  17 16   Temp: 98.1 F (36.7 C)  98.1 F (36.7 C) 98.1 F (36.7 C)  TempSrc: Oral  Oral Oral  SpO2: 96%  97% 97%  Weight:  161 lb 9.6 oz (73.3 kg)    Height:  5' 6"  (1.676 m)     General: Thin female in no acute distress Pulm: Good air movement with no wheezing or crackles CV: Regular rate and rhythm, no murmurs, no rubs Abdomen: Active bowel sounds, soft, nondistended, epigastric tenderness is improved, no peritoneal signs Extremities: No lower extremity edema.  Assessment/Plan:  Victoria Eaton is a 57 year old female with CAD status post drug-eluting stent to the RCA in 2006, hypertension, type 2 diabetes mellitus, uterine prolapse, PUD, and anxiety/depression who presented with epigastric abdominal pain and elevated troponins. Her abdominal pain is significantly improved and she is asking to eat. Further management as outlined below.  Gallstone Pancreatitis - Presented with epigastric pain, lipase 187, and findings on CT abdomen  consistent with pancreatitis. - Alk phos continues to trend down. Hemoglobin dropped to 9.5 today.  We will continue to monitor with daily labs. - Advance diet to full liquid - Continue maintenance fluids - Continue pain management with morphine 4 mg every 3 hours as needed - Continue IV Zofran for nausea control - Surgery on board, awaiting cardiology clearance prior to cholecystectomy   Type II NSTEMI CAD status post drug-eluting stent to the RCA in 2016 - Troponin trend: 0.11 -> 0.18 -> 0.18 -> 0.14 - Initial EKG in the ED illustrating ST elevation of V3 and V4. Most recent EKG showing T wave inversion in inferior leads. - Cardiology recommending echo to evaluate left ventricular EF and wall motion. States that the patient may need an ischemic evaluation such as a stress test prior to cholecystectomy.  Type 2 diabetes mellitus - Has not required any insulin - Sliding scale insulin   Hypertension - Home regimen includes lisinopril 40 mg daily, metoprolol 25 mg twice daily, and amlodipine 5 mg daily - Holding the lisinopril and amlodipine. Continue metoprolol  Cystocele with bilateral hydronephrosis and hydroureter - Stable  Hypokalemia. Resolved  Dispo: Anticipated discharge in approximately >1 day(s).   Victoria Homes, MD 10/12/2017, 5:34 AM My Pager: [phone]

## 2017-10-12 NOTE — Progress Notes (Signed)
Progress Note  Patient Name: Victoria Eaton Date of Encounter: 10/12/2017  Primary Cardiologist: Unknown  Subjective   No specific complaints to suggest myocardial ischemia.  Was hospitalized with urinary symptoms 1 week prior to this admission.  Now on hospital with pancreatitis related to choledocholithiasis.  Inpatient Medications    Scheduled Meds: . aspirin  81 mg Oral Daily  . atorvastatin  40 mg Oral q1800  . insulin aspart  0-9 Units Subcutaneous TID WC  . metoprolol tartrate  25 mg Oral BID   Continuous Infusions: . sodium chloride Stopped (10/11/17 0134)  . heparin 1,550 Units/hr (10/12/17 1340)  . lactated ringers 75 mL/hr at 10/12/17 1004   PRN Meds: morphine, ondansetron (ZOFRAN) IV   Vital Signs    Vitals:   10/12/17 0022 10/12/17 0603 10/12/17 0955 10/12/17 1220  BP: (!) 145/74 (!) 144/76 (!) 156/65 (!) 144/76  Pulse: 75 83  70  Resp: 16   17  Temp: 98.1 F (36.7 C) 98.1 F (36.7 C)  98.4 F (36.9 C)  TempSrc: Oral Oral  Oral  SpO2: 97% 98%  97%  Weight:      Height:        Intake/Output Summary (Last 24 hours) at 10/12/2017 1405 Last data filed at 10/12/2017 1000 Gross per 24 hour  Intake 2659.6 ml  Output 350 ml  Net 2309.6 ml   Filed Weights   10/11/17 1700  Weight: 161 lb 9.6 oz (73.3 kg)    Telemetry    Not on telemetry- Personally Reviewed  ECG    No clear evolutionary changes.  Most recent tracing from 10/12/17 demonstrates QS pattern V1 through V3 with a precordial biphasic T wave inversion.  Compared to the prior tracing from 10/02/2017, no significant changes occurred.- Personally Reviewed  Physical Exam  In the echo lab having procedure performed. Exam is unchanged from prior documentation as noted below. GEN: No acute distress.   Neck: No JVD Cardiac: RRR, no murmurs, rubs, or gallops.  Respiratory: Clear to auscultation bilaterally. GI: Soft, nontender, non-distended  MS: No edema; No deformity. Neuro:  Nonfocal    Psych: Normal affect   Labs    Chemistry Recent Labs  Lab 10/10/17 1753 10/11/17 0328 10/12/17 0030  NA 131* 136 139  K 3.4* 3.7 3.8  CL 98* 106 107  CO2 22 20* 22  GLUCOSE 254* 132* 85  BUN 14 10 6   CREATININE 0.93 0.84 0.88  CALCIUM 9.6 8.3* 8.4*  PROT 7.8 6.9 6.1*  ALBUMIN 3.1* 2.7* 2.5*  AST 26 20 20   ALT 49 40 32  ALKPHOS 231* 184* 153*  BILITOT 0.6 0.6 0.9  GFRNONAA >60 >60 >60  GFRAA >60 >60 >60  ANIONGAP 11 10 10      Hematology Recent Labs  Lab 10/10/17 1753 10/11/17 0328 10/12/17 0030  WBC 11.3* 11.8* 9.5  RBC 4.36 3.98 3.66*  HGB 11.5* 10.2* 9.5*  HCT 34.7* 32.0* 30.0*  MCV 79.6 80.4 82.0  MCH 26.4 25.6* 26.0  MCHC 33.1 31.9 31.7  RDW 17.0* 17.2* 17.7*  PLT 374 310 277    Cardiac Enzymes Recent Labs  Lab 10/10/17 1848 10/10/17 2359 10/11/17 0328 10/11/17 1618  TROPONINI 0.18* 0.18* 0.14* 0.16*    Recent Labs  Lab 10/10/17 1828  TROPIPOC 0.11*     BNPNo results for input(s): BNP, PROBNP in the last 168 hours.   DDimer No results for input(s): DDIMER in the last 168 hours.   Radiology    Ct Abdomen  Pelvis W Contrast  Result Date: 10/10/2017 CLINICAL DATA:  Pancreatitis. EXAM: CT ABDOMEN AND PELVIS WITH CONTRAST TECHNIQUE: Multidetector CT imaging of the abdomen and pelvis was performed using the standard protocol following bolus administration of intravenous contrast. CONTRAST:  146m ISOVUE-300 IOPAMIDOL (ISOVUE-300) INJECTION 61% COMPARISON:  06/21/2004 FINDINGS: Lower chest: No acute abnormality. Hepatobiliary: No suspicious liver abnormalities. Large cluster of stones within the dependent portion of the gallbladder measures 1.8 cm. No intrahepatic biliary dilatation. No common bile duct dilatation. Pancreas: Mild peripancreatic fat stranding identified compatible with acute pancreatitis. No pancreatic duct dilatation, pancreatic necrosis or pseudocyst. Spleen: Normal in size without focal abnormality. Adrenals/Urinary Tract: The  adrenal glands are normal. Bilateral hydronephrosis identified. Bilateral hydroureter is also noted. Pelvic floor laxity with large cystocele identified, image 62 of series 7. Stomach/Bowel: Stomach is normal. Small bowel loops have a normal course and caliber without obstruction. The appendix is not visualized separate from the right lower quadrant bowel loops. There is wall thickening and mild pericolonic fat stranding involving the descending colon and sigmoid colon suspicious for colitis. No complications identified. No pneumatosis, or perforation. No abscess. Vascular/Lymphatic: Aortic atherosclerosis. No aneurysm. No upper abdominal adenopathy. There is no pelvic or inguinal adenopathy. Reproductive: There is significant pelvic floor laxity. The uterus and adnexal structures are otherwise unremarkable. Other: No free fluid or fluid collections identified. Musculoskeletal: No aggressive lytic or sclerotic bone lesions. IMPRESSION: 1. Mild inflammatory changes involving the pancreas compatible with pancreatitis. No complications identified. 2. Multiple gallstones identified. No biliary dilatation and no specific findings to suggest choledocholithiasis. 3. Mild wall thickening involving the descending colon and sigmoid colon concerning for segmental colitis. Likely inflammatory or infectious. 4. Pelvic floor laxity with cystocele. Bilateral hydronephrosis and hydroureter is likely related to cystocele. 5.  Aortic Atherosclerosis (ICD10-I70.0). Electronically Signed   By: TKerby MoorsM.D.   On: 10/10/2017 20:19   Dg Chest Port 1 View  Result Date: 10/10/2017 CLINICAL DATA:  Chest pain/code stemi EXAM: PORTABLE CHEST 1 VIEW COMPARISON:  10/02/2017 FINDINGS: Normal mediastinum and cardiac silhouette. Normal pulmonary vasculature. No evidence of effusion, infiltrate, or pneumothorax. No acute bony abnormality. IMPRESSION: No acute cardiopulmonary process. Electronically Signed   By: SSuzy BouchardM.D.   On:  10/10/2017 19:38    Cardiac Studies   Echo is pending  Patient Profile     57y.o. female  with a hx of CAD c/b MI s/p PCI with remote RCA stent, DM2, HTN, depression who is being seen today for the evaluation of ECG changes and positive troponin at the request of KAntonietta Breach    Assessment & Plan    1.  Low, flat, troponin elevation of uncertain significance.  Does not represent acute coronary syndrome and likely represents demand ischemia.  We will schedule patient for myocardial perfusion imaging with pharmacologic stress.  N.p.o. after midnight for pharmacologic stress.  If the risk is less than high, will clear the patient for surgery. 2.  History of CAD with prior RCA stent greater than 10 years ago. 3.  Choledocholithiasis with pancreatitis.  For questions or updates, please contact CFosstonPlease consult [email or web address] for contact info under Cardiology/STEMI.      Signed, HSinclair Grooms MD  10/12/2017, 2:05 PM

## 2017-10-12 NOTE — Telephone Encounter (Signed)
Called pt to give her community resource information. After I left message for return call I see pt is admitted. Will check back next week to see if she's been discharged and if I can provide any community resources. She can reach me at number below.    Josepha Pigg, B.A.  Care Guide - [facility] [place]

## 2017-10-12 NOTE — Progress Notes (Signed)
[place] for Heparin  Indication: chest pain/ACS  Allergies  Allergen Reactions  . Kiwi Extract Hives  . Shrimp [Shellfish Allergy] Hives  . Penicillins     Reaction as a child    Vital Signs: Temp: 98 F (36.7 C) (01/21 1810) Temp Source: Oral (01/21 1810) BP: 155/69 (01/21 1810) Pulse Rate: 73 (01/21 1810)  Labs: Recent Labs    10/10/17 1753  10/10/17 1848 10/10/17 2359 10/11/17 0328  10/11/17 1618 10/12/17 0030 10/12/17 0958 10/12/17 1813  HGB 11.5*  --   --   --  10.2*  --   --  9.5*  --   --   HCT 34.7*  --   --   --  32.0*  --   --  30.0*  --   --   PLT 374  --   --   --  310  --   --  277  --   --   APTT  --   --  32  --   --   --   --   --   --   --   LABPROT  --   --  13.2  --   --   --   --   --   --   --   INR  --   --  1.01  --   --   --   --   --   --   --   HEPARINUNFRC  --   --   --   --   --    < > 0.16* 0.21* 0.21* 0.37  CREATININE 0.93  --   --   --  0.84  --   --  0.88  --   --   TROPONINI  --    < > 0.18* 0.18* 0.14*  --  0.16*  --   --   --    < > = values in this interval not displayed.    Estimated Creatinine Clearance: 71.4 mL/min (by C-G formula based on SCr of 0.88 mg/dL).  Assessment: 56 y/of continuing on heparin per pharmacy for ACS. Heparin level 0.21 on 1400 units/hr, remains sub-therapeutic despite rate increase. No interruption with infusion per RN. hgb 9.5, pltc 277K, stable. No bleeding noted per chart.  PM f/u > heparin level now at goal.  No bleeding or complications noted.   Goal of Therapy:  Heparin level 0.3-0.7 units/ml Monitor platelets by anticoagulation protocol: Yes   Plan:  -Continue IV heparin at same rate. -Daily CBC/heparin level -Monitor for bleeding, trend Hgb -F/u Cardiology plans  Uvaldo Rising, BCPS  Clinical Pharmacist Pager [phone]  10/12/2017 8:20 PM

## 2017-10-12 NOTE — Progress Notes (Signed)
Echocardiogram 2D Echocardiogram has been performed.  Matilde Bash 10/12/2017, 2:34 PM

## 2017-10-12 NOTE — Progress Notes (Signed)
[place] for Heparin  Indication: chest pain/ACS  Allergies  Allergen Reactions  . Kiwi Extract Hives  . Shrimp [Shellfish Allergy] Hives  . Penicillins     Reaction as a child    Vital Signs: Temp: 98.1 F (36.7 C) (01/21 0022) Temp Source: Oral (01/21 0022) BP: 145/74 (01/21 0022) Pulse Rate: 75 (01/21 0022)  Labs: Recent Labs    10/10/17 1753  10/10/17 1848 10/10/17 2359 10/11/17 0328 10/11/17 0605 10/11/17 1618 10/12/17 0030  HGB 11.5*  --   --   --  10.2*  --   --  9.5*  HCT 34.7*  --   --   --  32.0*  --   --  30.0*  PLT 374  --   --   --  310  --   --  277  APTT  --   --  32  --   --   --   --   --   LABPROT  --   --  13.2  --   --   --   --   --   INR  --   --  1.01  --   --   --   --   --   HEPARINUNFRC  --   --   --   --   --  <0.10* 0.16* 0.21*  CREATININE 0.93  --   --   --  0.84  --   --  0.88  TROPONINI  --    < > 0.18* 0.18* 0.14*  --  0.16*  --    < > = values in this interval not displayed.    Estimated Creatinine Clearance: 71.4 mL/min (by C-G formula based on SCr of 0.88 mg/dL).   Medical History: Past Medical History:  Diagnosis Date  . Allergy   . Anxiety   . Arthritis   . Bladder prolapse, female, acquired 09/14/2017  . Depression   . Diabetes mellitus without complication ([place])   . History of stomach ulcers   . Hypertension   . Myocardial infarction ([place]) 12/31/2004   Assessment: 56 y/of continuing on heparin per pharmacy for ACS. Hgb 9.5. Heparin level is sub-therapeutic despite rate increase  Goal of Therapy:  Heparin level 0.3-0.7 units/ml Monitor platelets by anticoagulation protocol: Yes   Plan:  -Increase heparin drip to 1400 units/hr -1000 HL -Daily CBC/heparin level -Monitor for bleeding, trend Hgb -F/u Cardiology plans  Narda Bonds, PharmD, [place] Pharmacist Phone: [phone]

## 2017-10-12 NOTE — Progress Notes (Signed)
Subjective/Chief Complaint: Pain better. No pain with liquids   Objective: Vital signs in last 24 hours: Temp:  [98.1 F (36.7 C)] 98.1 F (36.7 C) (01/21 0603) Pulse Rate:  [67-85] 83 (01/21 0603) Resp:  [16-18] 16 (01/21 0022) BP: (133-159)/(64-80) 156/65 (01/21 0955) SpO2:  [95 %-98 %] 98 % (01/21 0603) Weight:  [73.3 kg (161 lb 9.6 oz)] 73.3 kg (161 lb 9.6 oz) (01/20 1700) Last BM Date: 10/09/17  Intake/Output from previous day: 01/20 0701 - 01/21 0700 In: 2419.6 [I.V.:2419.6] Out: 350 [Urine:350] Intake/Output this shift: No intake/output data recorded.  Constitutional: No acute distress, conversant, appears states age. Eyes: Anicteric sclerae, moist conjunctiva, no lid lag Lungs: Clear to auscultation bilaterally, normal respiratory effort CV: regular rate and rhythm, no murmurs, no peripheral edema, pedal pulses 2+ GI: Soft, no masses or hepatosplenomegaly, non-tender to palpation Skin: No rashes, palpation reveals normal turgor Psychiatric: appropriate judgment and insight, oriented to person, place, and time   Lab Results:  Recent Labs    10/11/17 0328 10/12/17 0030  WBC 11.8* 9.5  HGB 10.2* 9.5*  HCT 32.0* 30.0*  PLT 310 277   BMET Recent Labs    10/11/17 0328 10/12/17 0030  NA 136 139  K 3.7 3.8  CL 106 107  CO2 20* 22  GLUCOSE 132* 85  BUN 10 6  CREATININE 0.84 0.88  CALCIUM 8.3* 8.4*   PT/INR Recent Labs    10/10/17 1848  LABPROT 13.2  INR 1.01   ABG No results for input(s): PHART, HCO3 in the last 72 hours.  Invalid input(s): PCO2, PO2  Studies/Results: Ct Abdomen Pelvis W Contrast  Result Date: 10/10/2017 CLINICAL DATA:  Pancreatitis. EXAM: CT ABDOMEN AND PELVIS WITH CONTRAST TECHNIQUE: Multidetector CT imaging of the abdomen and pelvis was performed using the standard protocol following bolus administration of intravenous contrast. CONTRAST:  169m ISOVUE-300 IOPAMIDOL (ISOVUE-300) INJECTION 61% COMPARISON:  06/21/2004  FINDINGS: Lower chest: No acute abnormality. Hepatobiliary: No suspicious liver abnormalities. Large cluster of stones within the dependent portion of the gallbladder measures 1.8 cm. No intrahepatic biliary dilatation. No common bile duct dilatation. Pancreas: Mild peripancreatic fat stranding identified compatible with acute pancreatitis. No pancreatic duct dilatation, pancreatic necrosis or pseudocyst. Spleen: Normal in size without focal abnormality. Adrenals/Urinary Tract: The adrenal glands are normal. Bilateral hydronephrosis identified. Bilateral hydroureter is also noted. Pelvic floor laxity with large cystocele identified, image 62 of series 7. Stomach/Bowel: Stomach is normal. Small bowel loops have a normal course and caliber without obstruction. The appendix is not visualized separate from the right lower quadrant bowel loops. There is wall thickening and mild pericolonic fat stranding involving the descending colon and sigmoid colon suspicious for colitis. No complications identified. No pneumatosis, or perforation. No abscess. Vascular/Lymphatic: Aortic atherosclerosis. No aneurysm. No upper abdominal adenopathy. There is no pelvic or inguinal adenopathy. Reproductive: There is significant pelvic floor laxity. The uterus and adnexal structures are otherwise unremarkable. Other: No free fluid or fluid collections identified. Musculoskeletal: No aggressive lytic or sclerotic bone lesions. IMPRESSION: 1. Mild inflammatory changes involving the pancreas compatible with pancreatitis. No complications identified. 2. Multiple gallstones identified. No biliary dilatation and no specific findings to suggest choledocholithiasis. 3. Mild wall thickening involving the descending colon and sigmoid colon concerning for segmental colitis. Likely inflammatory or infectious. 4. Pelvic floor laxity with cystocele. Bilateral hydronephrosis and hydroureter is likely related to cystocele. 5.  Aortic Atherosclerosis  (ICD10-I70.0). Electronically Signed   By: TKerby MoorsM.D.   On: 10/10/2017 20:19  Dg Chest Port 1 View  Result Date: 10/10/2017 CLINICAL DATA:  Chest pain/code stemi EXAM: PORTABLE CHEST 1 VIEW COMPARISON:  10/02/2017 FINDINGS: Normal mediastinum and cardiac silhouette. Normal pulmonary vasculature. No evidence of effusion, infiltrate, or pneumothorax. No acute bony abnormality. IMPRESSION: No acute cardiopulmonary process. Electronically Signed   By: Suzy Bouchard M.D.   On: 10/10/2017 19:38    Anti-infectives: Anti-infectives (From admission, onward)   None      Assessment/Plan: 57 y/o F with biliary pancreatitis  Past Medical History:  Diagnosis Date  . Allergy   . Anxiety   . Arthritis   . Bladder prolapse, female, acquired 09/14/2017  . Depression   . Diabetes mellitus without complication ([place])   . History of stomach ulcers   . Hypertension   . Myocardial infarction ([place]) 12/31/2004   1.  OK for liquids as tol 2. Will await Cards eval and clearance before proceeding with surgery. 3. Following    LOS: 2 days    Rosario Jacks., [facility] 10/12/2017

## 2017-10-13 ENCOUNTER — Inpatient Hospital Stay (HOSPITAL_COMMUNITY): Payer: BLUE CROSS/BLUE SHIELD

## 2017-10-13 DIAGNOSIS — E876 Hypokalemia: Secondary | ICD-10-CM

## 2017-10-13 DIAGNOSIS — R778 Other specified abnormalities of plasma proteins: Secondary | ICD-10-CM

## 2017-10-13 DIAGNOSIS — R7989 Other specified abnormal findings of blood chemistry: Secondary | ICD-10-CM

## 2017-10-13 DIAGNOSIS — R1013 Epigastric pain: Secondary | ICD-10-CM

## 2017-10-13 DIAGNOSIS — N134 Hydroureter: Secondary | ICD-10-CM

## 2017-10-13 DIAGNOSIS — R079 Chest pain, unspecified: Secondary | ICD-10-CM

## 2017-10-13 DIAGNOSIS — K859 Acute pancreatitis without necrosis or infection, unspecified: Secondary | ICD-10-CM

## 2017-10-13 LAB — COMPREHENSIVE METABOLIC PANEL
ALT: 28 U/L (ref 14–54)
ANION GAP: 12 (ref 5–15)
AST: 20 U/L (ref 15–41)
Albumin: 2.5 g/dL — ABNORMAL LOW (ref 3.5–5.0)
Alkaline Phosphatase: 128 U/L — ABNORMAL HIGH (ref 38–126)
BUN: 5 mg/dL — ABNORMAL LOW (ref 6–20)
CALCIUM: 9 mg/dL (ref 8.9–10.3)
CHLORIDE: 104 mmol/L (ref 101–111)
CO2: 23 mmol/L (ref 22–32)
Creatinine, Ser: 0.9 mg/dL (ref 0.44–1.00)
GFR calc Af Amer: >60 mL/min (ref >60)
GFR calc non Af Amer: >60 mL/min (ref >60)
Glucose, Bld: 146 mg/dL — ABNORMAL HIGH (ref 65–99)
Potassium: 3.1 mmol/L — ABNORMAL LOW (ref 3.5–5.1)
SODIUM: 139 mmol/L (ref 135–145)
Total Bilirubin: 0.6 mg/dL (ref 0.3–1.2)
Total Protein: 6 g/dL — ABNORMAL LOW (ref 6.5–8.1)

## 2017-10-13 LAB — GLUCOSE, CAPILLARY
GLUCOSE-CAPILLARY: 145 mg/dL — AB (ref 65–99)
Glucose-Capillary: 125 mg/dL — ABNORMAL HIGH (ref 65–99)
Glucose-Capillary: 127 mg/dL — ABNORMAL HIGH (ref 65–99)
Glucose-Capillary: 146 mg/dL — ABNORMAL HIGH (ref 65–99)
Glucose-Capillary: 201 mg/dL — ABNORMAL HIGH (ref 65–99)
Glucose-Capillary: 232 mg/dL — ABNORMAL HIGH (ref 65–99)

## 2017-10-13 LAB — CBC
HCT: 28.2 % — ABNORMAL LOW (ref 36.0–46.0)
Hemoglobin: 9.1 g/dL — ABNORMAL LOW (ref 12.0–15.0)
MCH: 26.1 pg (ref 26.0–34.0)
MCHC: 32.3 g/dL (ref 30.0–36.0)
MCV: 80.8 fL (ref 78.0–100.0)
PLATELETS: 292 10*3/uL (ref 150–400)
RBC: 3.49 MIL/uL — ABNORMAL LOW (ref 3.87–5.11)
RDW: 18.3 % — AB (ref 11.5–15.5)
WBC: 5.4 10*3/uL (ref 4.0–10.5)

## 2017-10-13 LAB — NM MYOCAR MULTI W/PLANAR W/WALL MOTION / EF
CSEPED: 5 min
CSEPEW: 1 METS
Exercise duration (sec): 0 s
MPHR: 162 {beats}/min
Peak HR: 100 {beats}/min
Percent HR: 61 %
Rest HR: 67 {beats}/min

## 2017-10-13 LAB — HEPARIN LEVEL (UNFRACTIONATED): Heparin Unfractionated: 0.45 IU/mL (ref 0.30–0.70)

## 2017-10-13 MED ORDER — SODIUM CHLORIDE 0.9 % WEIGHT BASED INFUSION
3.0000 mL/kg/h | INTRAVENOUS | Status: DC
  Administered 2017-10-14: 3 mL/kg/h via INTRAVENOUS

## 2017-10-13 MED ORDER — REGADENOSON 0.4 MG/5ML IV SOLN
0.4000 mg | Freq: Once | INTRAVENOUS | Status: AC
  Administered 2017-10-13: 0.4 mg via INTRAVENOUS
  Filled 2017-10-13: qty 5

## 2017-10-13 MED ORDER — ASPIRIN 81 MG PO CHEW: 81.0000 mg | CHEWABLE_TABLET | Freq: Every day | ORAL | Status: DC

## 2017-10-13 MED ORDER — SODIUM CHLORIDE 0.9 % IV SOLN: 250.0000 mL | INTRAVENOUS | Status: DC | PRN

## 2017-10-13 MED ORDER — SODIUM CHLORIDE 0.9 % WEIGHT BASED INFUSION
1.0000 mL/kg/h | INTRAVENOUS | Status: DC
  Administered 2017-10-14: 1 mL/kg/h via INTRAVENOUS

## 2017-10-13 MED ORDER — REGADENOSON 0.4 MG/5ML IV SOLN
INTRAVENOUS | Status: AC
  Filled 2017-10-13: qty 5

## 2017-10-13 MED ORDER — TECHNETIUM TC 99M TETROFOSMIN IV KIT
10.0000 | PACK | Freq: Once | INTRAVENOUS | Status: AC | PRN
  Administered 2017-10-13: 10 via INTRAVENOUS

## 2017-10-13 MED ORDER — SODIUM CHLORIDE 0.9% FLUSH: 3.0000 mL | INTRAVENOUS | Status: DC | PRN

## 2017-10-13 MED ORDER — TECHNETIUM TC 99M TETROFOSMIN IV KIT
30.0000 | PACK | Freq: Once | INTRAVENOUS | Status: AC | PRN
  Administered 2017-10-13: 30 via INTRAVENOUS

## 2017-10-13 MED ORDER — ASPIRIN 81 MG PO CHEW
81.0000 mg | CHEWABLE_TABLET | ORAL | Status: AC
  Administered 2017-10-14: 81 mg via ORAL
  Filled 2017-10-13: qty 1

## 2017-10-13 MED ORDER — POTASSIUM CHLORIDE CRYS ER 20 MEQ PO TBCR
40.0000 meq | EXTENDED_RELEASE_TABLET | Freq: Two times a day (BID) | ORAL | Status: DC
  Administered 2017-10-13 – 2017-10-15 (×5): 40 meq via ORAL
  Filled 2017-10-13 (×5): qty 2

## 2017-10-13 MED ORDER — SODIUM CHLORIDE 0.9% FLUSH: 3.0000 mL | Freq: Two times a day (BID) | INTRAVENOUS | Status: DC

## 2017-10-13 NOTE — H&P (View-Only) (Signed)
The patient has been seen in conjunction with Kathyrn Drown, NP. All aspects of care have been considered and discussed. The patient has been personally interviewed, examined, and all clinical data has been reviewed.   No symptoms overnight.  Clinically stable.  Await myocardial perfusion imaging and if no high risk features, will be cleared for the surgical procedure.   Progress Note  Patient Name: Victoria Eaton Date of Encounter: 10/13/2017  Primary Cardiologist: Dr. Debara Pickett  Subjective   Pt denies chest pain today, doing well. She is currently having her stress procedure completed. She has abdominal pain and is awaiting surgery.   Inpatient Medications    Scheduled Meds: . aspirin  81 mg Oral Daily  . atorvastatin  40 mg Oral q1800  . insulin aspart  0-9 Units Subcutaneous TID WC  . metoprolol tartrate  25 mg Oral BID  . potassium chloride  40 mEq Oral BID  . regadenoson       Continuous Infusions: . sodium chloride Stopped (10/11/17 0134)  . heparin 1,550 Units/hr (10/13/17 0543)  . lactated ringers 75 mL/hr at 10/12/17 1004   PRN Meds: morphine, ondansetron (ZOFRAN) IV   Vital Signs    Vitals:   10/12/17 2148 10/13/17 0533 10/13/17 0930 10/13/17 1009  BP: 138/65 (!) 157/64 (!) 145/68 (!) 144/69  Pulse: 78 73    Resp: 17 18    Temp: 98 F (36.7 C) 98.1 F (36.7 C)    TempSrc: Oral Oral    SpO2: 96% 98%    Weight:      Height:        Intake/Output Summary (Last 24 hours) at 10/13/2017 1010 Last data filed at 10/12/2017 1800 Gross per 24 hour  Intake 1197.84 ml  Output -  Net 1197.84 ml   Filed Weights   10/11/17 1700  Weight: 161 lb 9.6 oz (73.3 kg)    Physical Exam   General: Well developed, well nourished, NAD Skin: Warm, dry, intact  Head: Normocephalic, atraumatic, clear, moist mucus membranes. Neck: Negative for carotid bruits. No JVD Lungs:Clear to ausculation bilaterally. No wheezes, rales, or rhonchi. Breathing is  unlabored. Cardiovascular: RRR with S1 S2. No murmurs, rubs, or gallops MSK: Strength and tone appear normal for age. 5/5 in all extremities Extremities: No edema. No clubbing or cyanosis. DP/PT pulses 2+ bilaterally Neuro: Alert and oriented. No focal deficits. No facial asymmetry. MAE spontaneously. Psych: Responds to questions appropriately with normal affect.    Labs    Chemistry Recent Labs  Lab 10/11/17 0328 10/12/17 0030 10/13/17 0450  NA 136 139 139  K 3.7 3.8 3.1*  CL 106 107 104  CO2 20* 22 23  GLUCOSE 132* 85 146*  BUN 10 6 5*  CREATININE 0.84 0.88 0.90  CALCIUM 8.3* 8.4* 9.0  PROT 6.9 6.1* 6.0*  ALBUMIN 2.7* 2.5* 2.5*  AST 20 20 20   ALT 40 32 28  ALKPHOS 184* 153* 128*  BILITOT 0.6 0.9 0.6  GFRNONAA >60 >60 >60  GFRAA >60 >60 >60  ANIONGAP 10 10 12      Hematology Recent Labs  Lab 10/11/17 0328 10/12/17 0030 10/13/17 0450  WBC 11.8* 9.5 5.4  RBC 3.98 3.66* 3.49*  HGB 10.2* 9.5* 9.1*  HCT 32.0* 30.0* 28.2*  MCV 80.4 82.0 80.8  MCH 25.6* 26.0 26.1  MCHC 31.9 31.7 32.3  RDW 17.2* 17.7* 18.3*  PLT 310 277 292    Cardiac Enzymes Recent Labs  Lab 10/10/17 1848 10/10/17 2359 10/11/17 0328  10/11/17 1618  TROPONINI 0.18* 0.18* 0.14* 0.16*    Recent Labs  Lab 10/10/17 1828  TROPIPOC 0.11*     BNPNo results for input(s): BNP, PROBNP in the last 168 hours.   DDimer No results for input(s): DDIMER in the last 168 hours.   Radiology    No results found.  Telemetry    NSR 10/13/17 - Personally Reviewed  ECG    10/12/17 NSR with mild ST elevated in aVR, T-wave inversions in leads V3-V6 - Personally Reviewed  Cardiac Studies   Echo 10/12/17 Study Conclusions  - Left ventricle: Septal apical and mid/apical inferior wall   hypokinesis. The cavity size was moderately dilated. Wall   thickness was increased in a pattern of moderate LVH. Systolic   function was normal. The estimated ejection fraction was 40%.   Wall motion was normal;  there were no regional wall motion   abnormalities. Left ventricular diastolic function parameters   were normal. - Mitral valve: There was mild regurgitation. - Left atrium: The atrium was mildly dilated. - Atrial septum: A patent foramen ovale cannot be excluded.  Patient Profile     57 y.o. female with a hx of CAD c/b MI s/p PCI with remote RCA stent, DM2, HTN, depressionwho is followed by cardiology for the evaluation of ECG changes and positive troponin pre-procedurallyat the request of Antonietta Breach.   Assessment & Plan    1. Elevated troponin: -Troponin levels, 0.16, 0.14, 0.18 -Denies chest pain  -Myocardial perfusion completed today, results pending. [facility] to read. -Per MD note, if risk is less than high, she will be cleared for surgery. -On ASA, BB  2. Hx of CAD s/p RCA stent placement in 2006: -Stent placement in [place], no records available -On ASA, BB  3. Choledocholithiasis with pancreatitis awaiting surgical clearance: -Per IM, for surgery once cleared by Cardiology.     [name] presented for a nuclear stress test today.  No immediate complications.  Stress imaging is pending at this time.  Preliminary EKG findings may be listed in the chart, but the stress test result will not be finalized until perfusion imaging is complete.  One day study, [facility] to read.  Kathyrn Drown, PA-C 10/13/2017, 10:39 AM    Signed, Kathyrn Drown NP-C [facility] Pager: [phone] 10/13/2017, 10:10 AM   For questions or updates, please contact   Please consult [email or web address] for contact info under Cardiology/STEMI.

## 2017-10-13 NOTE — Progress Notes (Signed)
   Subjective: Patient is doing well this AM. She is having some epigastric pain but she thinks that it is a result of the test. She states that she did eat some crackers today without increase in epigastric pain or nausea/vomiting. She is requesting to advance her diet. We discussed that we need to wait for her stress test results before giving her diet in case any further intervention is needed. She agrees with the plan. She states that she was told by surgery that she may have her surgery tomorrow pending her Myoview results. Overall she understands the plan for medical treatment. All questions and concerns addressed.  Objective: Vital signs in last 24 hours: Vitals:   10/12/17 1220 10/12/17 1810 10/12/17 2148 10/13/17 0533  BP: (!) 144/76 (!) 155/69 138/65 (!) 157/64  Pulse: 70 73 78 73  Resp: 17 17 17 18   Temp: 98.4 F (36.9 C) 98 F (36.7 C) 98 F (36.7 C) 98.1 F (36.7 C)  TempSrc: Oral Oral Oral Oral  SpO2: 97% 98% 96% 98%  Weight:      Height:       General: Thin female in no acute distress Pulm: Good air movement with no wheezing or crackles CV: Regular rate and rhythm, no murmurs, no rubs Abdomen: Active bowel sounds, soft, nondistended, no peritoneal signs Extremities: No lower extremity edema  Assessment/Plan:  Victoria Eaton is a 57 year old female with CAD status post drug-eluting stent to the RCA in 2006, hypertension, type 2 diabetes mellitus, uterine prolapse, PUD, and anxiety/depression who presented with epigastric abdominal pain and elevated troponins. Feeling well today but continues to have some epigastric pain. Denies nausea and vomiting. Further management as outlined below.  Gallstone Pancreatitis -Presented with epigastric pain, lipase 187, and findings on CT abdomen consistent with pancreatitis. - Alk phos continues to trend down. Hemoglobin dropped to 9.1 today.  We will continue to monitor with daily labs. - Will advance diet if Myoview is low risk. -  Continue maintenance fluids -Continue pain management with morphine 4 mg every 3 hours as needed -Continue IV Zofran for nausea control - Surgery on board, awaiting cardiology clearance prior to cholecystectomy  Type IINSTEMI CAD status post drug-eluting stent to the RCA in 2016 -Troponin trend:0.11 -> 0.18 -> 0.18 -> 0.14 -Initial EKG in the ED illustrating ST elevation of V3 and V4. Most recent EKG showing T wave inversion in inferior leads. - Echocardiogram done on 1/21 showing EF 40% with septal apical and apical inferior wall hypokinesis, left ventricular dilation, mild mitral regurg, and left atrial enlargement. - Cardiology to take the patient for myocardial perfusion imaging with pharmacologic stress test today. Study has been completed but results are pending.  Type 2 diabetes mellitus -Sliding scale insulin   Hypertension -Home regimen includes lisinopril 40 mg daily, metoprolol 25 mg twice daily, and amlodipine 5 mg daily -Holding the lisinopril and amlodipine. Continue metoprolol  Cystocele with bilateral hydronephrosis and hydroureter -Stable  Hypokalemia.Repleteing   Dispo: Anticipated discharge pending clinical improvement.   Ina Homes, MD 10/13/2017, 5:50 AM My Pager: [phone]

## 2017-10-13 NOTE — Progress Notes (Signed)
The patient has been seen in conjunction with Kathyrn Drown, NP. All aspects of care have been considered and discussed. The patient has been personally interviewed, examined, and all clinical data has been reviewed.   No symptoms overnight.  Clinically stable.  Await myocardial perfusion imaging and if no high risk features, will be cleared for the surgical procedure.   Progress Note  Patient Name: Victoria Eaton Date of Encounter: 10/13/2017  Primary Cardiologist: Dr. Debara Pickett  Subjective   Pt denies chest pain today, doing well. She is currently having her stress procedure completed. She has abdominal pain and is awaiting surgery.   Inpatient Medications    Scheduled Meds: . aspirin  81 mg Oral Daily  . atorvastatin  40 mg Oral q1800  . insulin aspart  0-9 Units Subcutaneous TID WC  . metoprolol tartrate  25 mg Oral BID  . potassium chloride  40 mEq Oral BID  . regadenoson       Continuous Infusions: . sodium chloride Stopped (10/11/17 0134)  . heparin 1,550 Units/hr (10/13/17 0543)  . lactated ringers 75 mL/hr at 10/12/17 1004   PRN Meds: morphine, ondansetron (ZOFRAN) IV   Vital Signs    Vitals:   10/12/17 2148 10/13/17 0533 10/13/17 0930 10/13/17 1009  BP: 138/65 (!) 157/64 (!) 145/68 (!) 144/69  Pulse: 78 73    Resp: 17 18    Temp: 98 F (36.7 C) 98.1 F (36.7 C)    TempSrc: Oral Oral    SpO2: 96% 98%    Weight:      Height:        Intake/Output Summary (Last 24 hours) at 10/13/2017 1010 Last data filed at 10/12/2017 1800 Gross per 24 hour  Intake 1197.84 ml  Output -  Net 1197.84 ml   Filed Weights   10/11/17 1700  Weight: 161 lb 9.6 oz (73.3 kg)    Physical Exam   General: Well developed, well nourished, NAD Skin: Warm, dry, intact  Head: Normocephalic, atraumatic, clear, moist mucus membranes. Neck: Negative for carotid bruits. No JVD Lungs:Clear to ausculation bilaterally. No wheezes, rales, or rhonchi. Breathing is  unlabored. Cardiovascular: RRR with S1 S2. No murmurs, rubs, or gallops MSK: Strength and tone appear normal for age. 5/5 in all extremities Extremities: No edema. No clubbing or cyanosis. DP/PT pulses 2+ bilaterally Neuro: Alert and oriented. No focal deficits. No facial asymmetry. MAE spontaneously. Psych: Responds to questions appropriately with normal affect.    Labs    Chemistry Recent Labs  Lab 10/11/17 0328 10/12/17 0030 10/13/17 0450  NA 136 139 139  K 3.7 3.8 3.1*  CL 106 107 104  CO2 20* 22 23  GLUCOSE 132* 85 146*  BUN 10 6 5*  CREATININE 0.84 0.88 0.90  CALCIUM 8.3* 8.4* 9.0  PROT 6.9 6.1* 6.0*  ALBUMIN 2.7* 2.5* 2.5*  AST 20 20 20   ALT 40 32 28  ALKPHOS 184* 153* 128*  BILITOT 0.6 0.9 0.6  GFRNONAA >60 >60 >60  GFRAA >60 >60 >60  ANIONGAP 10 10 12      Hematology Recent Labs  Lab 10/11/17 0328 10/12/17 0030 10/13/17 0450  WBC 11.8* 9.5 5.4  RBC 3.98 3.66* 3.49*  HGB 10.2* 9.5* 9.1*  HCT 32.0* 30.0* 28.2*  MCV 80.4 82.0 80.8  MCH 25.6* 26.0 26.1  MCHC 31.9 31.7 32.3  RDW 17.2* 17.7* 18.3*  PLT 310 277 292    Cardiac Enzymes Recent Labs  Lab 10/10/17 1848 10/10/17 2359 10/11/17 0328  10/11/17 1618  TROPONINI 0.18* 0.18* 0.14* 0.16*    Recent Labs  Lab 10/10/17 1828  TROPIPOC 0.11*     BNPNo results for input(s): BNP, PROBNP in the last 168 hours.   DDimer No results for input(s): DDIMER in the last 168 hours.   Radiology    No results found.  Telemetry    NSR 10/13/17 - Personally Reviewed  ECG    10/12/17 NSR with mild ST elevated in aVR, T-wave inversions in leads V3-V6 - Personally Reviewed  Cardiac Studies   Echo 10/12/17 Study Conclusions  - Left ventricle: Septal apical and mid/apical inferior wall   hypokinesis. The cavity size was moderately dilated. Wall   thickness was increased in a pattern of moderate LVH. Systolic   function was normal. The estimated ejection fraction was 40%.   Wall motion was normal;  there were no regional wall motion   abnormalities. Left ventricular diastolic function parameters   were normal. - Mitral valve: There was mild regurgitation. - Left atrium: The atrium was mildly dilated. - Atrial septum: A patent foramen ovale cannot be excluded.  Patient Profile     57 y.o. female with a hx of CAD c/b MI s/p PCI with remote RCA stent, DM2, HTN, depressionwho is followed by cardiology for the evaluation of ECG changes and positive troponin pre-procedurallyat the request of Antonietta Breach.   Assessment & Plan    1. Elevated troponin: -Troponin levels, 0.16, 0.14, 0.18 -Denies chest pain  -Myocardial perfusion completed today, results pending. [facility] to read. -Per MD note, if risk is less than high, she will be cleared for surgery. -On ASA, BB  2. Hx of CAD s/p RCA stent placement in 2006: -Stent placement in [place], no records available -On ASA, BB  3. Choledocholithiasis with pancreatitis awaiting surgical clearance: -Per IM, for surgery once cleared by Cardiology.     Victoria Eaton presented for a nuclear stress test today.  No immediate complications.  Stress imaging is pending at this time.  Preliminary EKG findings may be listed in the chart, but the stress test result will not be finalized until perfusion imaging is complete.  One day study, [facility] to read.  Kathyrn Drown, PA-C 10/13/2017, 10:39 AM    Signed, Kathyrn Drown NP-C [facility] Pager: [phone] 10/13/2017, 10:10 AM   For questions or updates, please contact   Please consult [email or web address] for contact info under Cardiology/STEMI.

## 2017-10-13 NOTE — Progress Notes (Signed)
ANTICOAGULATION CONSULT NOTE - Follow Up Consult  Pharmacy Consult for Heparin Indication: elevated troponin  Allergies  Allergen Reactions  . Kiwi Extract Hives  . Shrimp [Shellfish Allergy] Hives  . Penicillins     Reaction as a child     Patient Measurements: Height: 5' 6"  (167.6 cm) Weight: 161 lb 9.6 oz (73.3 kg) IBW/kg (Calculated) : 59.3 Heparin Dosing Weight: 73.3 kg  Vital Signs: Temp: 98.1 F (36.7 C) (01/22 0533) Temp Source: Oral (01/22 0533) BP: 137/70 (01/22 1012) Pulse Rate: 73 (01/22 0533)  Labs: Recent Labs    10/10/17 1848 10/10/17 2359 10/11/17 0328  10/11/17 1618 10/12/17 0030 10/12/17 0958 10/12/17 1813 10/13/17 0450  HGB  --   --  10.2*  --   --  9.5*  --   --  9.1*  HCT  --   --  32.0*  --   --  30.0*  --   --  28.2*  PLT  --   --  310  --   --  277  --   --  292  APTT 32  --   --   --   --   --   --   --   --   LABPROT 13.2  --   --   --   --   --   --   --   --   INR 1.01  --   --   --   --   --   --   --   --   HEPARINUNFRC  --   --   --    < > 0.16* 0.21* 0.21* 0.37 0.45  CREATININE  --   --  0.84  --   --  0.88  --   --  0.90  TROPONINI 0.18* 0.18* 0.14*  --  0.16*  --   --   --   --    < > = values in this interval not displayed.    Estimated Creatinine Clearance: 69.8 mL/min (by C-G formula based on SCr of 0.9 mg/dL).   Medications:  Scheduled:  . aspirin  81 mg Oral Daily  . atorvastatin  40 mg Oral q1800  . insulin aspart  0-9 Units Subcutaneous TID WC  . metoprolol tartrate  25 mg Oral BID  . potassium chloride  40 mEq Oral BID  . regadenoson       Infusions:  . sodium chloride Stopped (10/11/17 0134)  . heparin 1,550 Units/hr (10/13/17 0543)  . lactated ringers 75 mL/hr at 10/12/17 1004    Assessment: 57 yo F with hx CAD presented to ED with CP/epigastric pain and found to have gallstone pancreatitis.  Pt initially started on heparin pending cardiology eval.  Heparin level remains therapeutic on 1550  units/hr.  Goal of Therapy:  Heparin level 0.3-0.7 units/ml Monitor platelets by anticoagulation protocol: Yes   Plan:  Continue Heparin drip at 1550 units/hr Daily CBC/heparin level Monitor for bleeding F/U Cardiology plans >> stress test 1/22 F/U restarting home meds (Wellbutrin XL, Zoloft)  [employer], Pharm.D., BCPS Clinical Pharmacist Pager: [phone] Clinical phone for 10/13/2017 from 8:30-4:00 is [phone]. After 4pm, please call [facility] [phone]) for assistance. 10/13/2017 10:46 AM

## 2017-10-13 NOTE — Progress Notes (Signed)
    CC:  Abdominal pain  Subjective: Pt hungry and wants to know what is next.  Myocardia stress test done this AM, she said that made her hurt more.  Not to much discomfort currently, soft on exam and not complaining of pain with palpation.  Objective: Vital signs in last 24 hours: Temp:  [98 F (36.7 C)-98.4 F (36.9 C)] 98.1 F (36.7 C) (01/22 0533) Pulse Rate:  [70-78] 73 (01/22 0533) Resp:  [17-18] 18 (01/22 0533) BP: (132-157)/(64-76) 137/70 (01/22 1012) SpO2:  [96 %-98 %] 98 % (01/22 0533) Last BM Date: 10/09/17 480 PO yesterday 957 IV Voided x 3 No BM recorded Afebrile, VSS K+ 3.1 Glucose 146 No lipase, LFT's are normal WBC is normal H/H is stable but coming down   Intake/Output from previous day: 01/21 0701 - 01/22 0700 In: 1437.8 [P.O.:480; I.V.:957.8] Out: -  Intake/Output this shift: No intake/output data recorded.  General appearance: alert, cooperative and no distress GI: soft, complain of pain RUQ and some in the LUQ, but not made worse with palpation.  Abdomen is soft and + BS.  Lab Results:  Recent Labs    10/12/17 0030 10/13/17 0450  WBC 9.5 5.4  HGB 9.5* 9.1*  HCT 30.0* 28.2*  PLT 277 292    BMET Recent Labs    10/12/17 0030 10/13/17 0450  NA 139 139  K 3.8 3.1*  CL 107 104  CO2 22 23  GLUCOSE 85 146*  BUN 6 5*  CREATININE 0.88 0.90  CALCIUM 8.4* 9.0   PT/INR Recent Labs    10/10/17 1848  LABPROT 13.2  INR 1.01    Recent Labs  Lab 10/10/17 1753 10/11/17 0328 10/12/17 0030 10/13/17 0450  AST 26 20 20 20   ALT 49 40 32 28  ALKPHOS 231* 184* 153* 128*  BILITOT 0.6 0.6 0.9 0.6  PROT 7.8 6.9 6.1* 6.0*  ALBUMIN 3.1* 2.7* 2.5* 2.5*     Lipase     Component Value Date/Time   LIPASE 187 (H) 10/10/2017 1753     Medications: . aspirin  81 mg Oral Daily  . atorvastatin  40 mg Oral q1800  . insulin aspart  0-9 Units Subcutaneous TID WC  . metoprolol tartrate  25 mg Oral BID  . potassium chloride  40 mEq Oral BID   . regadenoson       . sodium chloride Stopped (10/11/17 0134)  . heparin 1,550 Units/hr (10/13/17 0543)  . lactated ringers 75 mL/hr at 10/12/17 1004   Anti-infectives (From admission, onward)   None      Assessment/Plan Pancreatitis with multiple gallstones - biliary pancreatitis Possible segmental colitis - per CT   Elevated Troponin's - Cardiology eval =>> stress test Hospitalized 1/11-1/12/19 with pyelonephritis Type II diabetes Hypertension Hypokalemia - resolved Cystocele - Bilateral hydronephrosis/Hydroureter    FEN:  IV fluids/NPO ID:  None DVT:  Heparin Foley:  None Follow up:  [facility]   Plan:  Await cardiology, recheck labs in AM, make her NPO after MN and see if we can do her surgery tomorrow.  If she gets a diet I would stick to clears for now.         LOS: 3 days    Adrain Butrick 10/13/2017 [phone]

## 2017-10-13 NOTE — Progress Notes (Signed)
[facility] PHARMACOLOGIC MYOVIEW 10/13/17:  Study Result      Horizontal ST segment depression ST segment depression was noted during stress in the I leads.  No T wave inversion was noted during stress.  The left ventricular ejection fraction is moderately decreased (30-44%).  This is a high risk study.  Findings consistent with prior myocardial infarction.    ECHOCARDIOGRAM 10/12/17 Study Conclusions   - Left ventricle: Septal apical and mid/apical inferior wall   hypokinesis. The cavity size was moderately dilated. Wall   thickness was increased in a pattern of moderate LVH. Systolic   function was normal. The estimated ejection fraction was 40%.   Wall motion was normal; there were no regional wall motion   abnormalities. Left ventricular diastolic function parameters   were normal. - Mitral valve: There was mild regurgitation. - Left atrium: The atrium was mildly dilated. - Atrial septum: A patent foramen ovale cannot be excluded.   IMPRESSION: The myocardial perfusion imaging study has high risk features.  Echocardiography also demonstrates reduced LV function.  She needs coronary angiography prior to proceeding with moderate to high stress surgical procedure.   RECOMMENDATIONS:   Left heart cath with coronary angiography The patient was counseled to undergo left heart catheterization, coronary angiography, and possible percutaneous coronary intervention with stent implantation. The procedural risks and benefits were discussed in detail. The risks discussed included death, stroke, myocardial infarction, life-threatening bleeding, limb ischemia, kidney injury, allergy, and possible emergency cardiac surgery. The risk of these significant complications were estimated to occur less than 1% of the time. After discussion, the patient has agreed to proceed.

## 2017-10-14 ENCOUNTER — Other Ambulatory Visit: Payer: Self-pay | Admitting: *Deleted

## 2017-10-14 ENCOUNTER — Encounter (HOSPITAL_COMMUNITY)
Admission: EM | Disposition: A | Discharge status: Home / Self Care | Payer: Self-pay | Source: Home / Self Care | Attending: Student in an Organized Health Care Education/Training Program

## 2017-10-14 ENCOUNTER — Encounter (HOSPITAL_COMMUNITY): Payer: Self-pay | Admitting: Cardiology

## 2017-10-14 DIAGNOSIS — Z8249 Family history of ischemic heart disease and other diseases of the circulatory system: Secondary | ICD-10-CM

## 2017-10-14 DIAGNOSIS — I2510 Atherosclerotic heart disease of native coronary artery without angina pectoris: Secondary | ICD-10-CM

## 2017-10-14 DIAGNOSIS — I25110 Atherosclerotic heart disease of native coronary artery with unstable angina pectoris: Secondary | ICD-10-CM

## 2017-10-14 HISTORY — PX: CARDIAC CATHETERIZATION: SHX172

## 2017-10-14 HISTORY — PX: LEFT HEART CATH AND CORONARY ANGIOGRAPHY: CATH118249

## 2017-10-14 LAB — CBC
HCT: 29.7 % — ABNORMAL LOW (ref 36.0–46.0)
HEMATOCRIT: 30.8 % — AB (ref 36.0–46.0)
HEMOGLOBIN: 9.4 g/dL — AB (ref 12.0–15.0)
Hemoglobin: 10.3 g/dL — ABNORMAL LOW (ref 12.0–15.0)
MCH: 25.8 pg — AB (ref 26.0–34.0)
MCH: 27.2 pg (ref 26.0–34.0)
MCHC: 31.6 g/dL (ref 30.0–36.0)
MCHC: 33.4 g/dL (ref 30.0–36.0)
MCV: 81.6 fL (ref 78.0–100.0)
MCV: 81.3 fL (ref 78.0–100.0)
PLATELETS: 289 10*3/uL (ref 150–400)
PLATELETS: 284 10*3/uL (ref 150–400)
RBC: 3.64 MIL/uL — ABNORMAL LOW (ref 3.87–5.11)
RBC: 3.79 MIL/uL — ABNORMAL LOW (ref 3.87–5.11)
RDW: 18.1 % — AB (ref 11.5–15.5)
RDW: 18 % — AB (ref 11.5–15.5)
WBC: 6.5 10*3/uL (ref 4.0–10.5)
WBC: 5.6 10*3/uL (ref 4.0–10.5)

## 2017-10-14 LAB — GLUCOSE, CAPILLARY
GLUCOSE-CAPILLARY: 188 mg/dL — AB (ref 65–99)
GLUCOSE-CAPILLARY: 334 mg/dL — AB (ref 65–99)
Glucose-Capillary: 142 mg/dL — ABNORMAL HIGH (ref 65–99)
Glucose-Capillary: 150 mg/dL — ABNORMAL HIGH (ref 65–99)
Glucose-Capillary: 168 mg/dL — ABNORMAL HIGH (ref 65–99)
Glucose-Capillary: 172 mg/dL — ABNORMAL HIGH (ref 65–99)
Glucose-Capillary: 182 mg/dL — ABNORMAL HIGH (ref 65–99)

## 2017-10-14 LAB — CREATININE, SERUM
CREATININE: 0.98 mg/dL (ref 0.44–1.00)
GFR calc Af Amer: >60 mL/min (ref >60)
GFR calc non Af Amer: >60 mL/min (ref >60)

## 2017-10-14 LAB — COMPREHENSIVE METABOLIC PANEL
ALBUMIN: 2.6 g/dL — AB (ref 3.5–5.0)
ALT: 38 U/L (ref 14–54)
AST: 38 U/L (ref 15–41)
Alkaline Phosphatase: 121 U/L (ref 38–126)
Anion gap: 11 (ref 5–15)
BUN: 6 mg/dL (ref 6–20)
CO2: 23 mmol/L (ref 22–32)
Calcium: 8.8 mg/dL — ABNORMAL LOW (ref 8.9–10.3)
Chloride: 104 mmol/L (ref 101–111)
Creatinine, Ser: 1.04 mg/dL — ABNORMAL HIGH (ref 0.44–1.00)
GFR calc non Af Amer: 58 mL/min — ABNORMAL LOW (ref >60)
GLUCOSE: 171 mg/dL — AB (ref 65–99)
POTASSIUM: 3.8 mmol/L (ref 3.5–5.1)
SODIUM: 138 mmol/L (ref 135–145)
Total Bilirubin: 0.6 mg/dL (ref 0.3–1.2)
Total Protein: 6.2 g/dL — ABNORMAL LOW (ref 6.5–8.1)

## 2017-10-14 LAB — LIPASE, BLOOD: Lipase: 27 U/L (ref 11–51)

## 2017-10-14 LAB — HEPARIN LEVEL (UNFRACTIONATED): HEPARIN UNFRACTIONATED: 0.36 [IU]/mL (ref 0.30–0.70)

## 2017-10-14 LAB — SURGICAL PCR SCREEN
MRSA, PCR: NEGATIVE
STAPHYLOCOCCUS AUREUS: NEGATIVE

## 2017-10-14 SURGERY — LEFT HEART CATH AND CORONARY ANGIOGRAPHY
Anesthesia: LOCAL

## 2017-10-14 MED ORDER — MIDAZOLAM HCL 2 MG/2ML IJ SOLN
INTRAMUSCULAR | Status: AC
  Filled 2017-10-14: qty 2

## 2017-10-14 MED ORDER — SODIUM CHLORIDE 0.9 % WEIGHT BASED INFUSION: 1.0000 mL/kg/h | INTRAVENOUS | Status: DC

## 2017-10-14 MED ORDER — FENTANYL CITRATE (PF) 100 MCG/2ML IJ SOLN
INTRAMUSCULAR | Status: DC | PRN
  Administered 2017-10-14: 25 ug via INTRAVENOUS

## 2017-10-14 MED ORDER — HEPARIN (PORCINE) IN NACL 2-0.9 UNIT/ML-% IJ SOLN
INTRAMUSCULAR | Status: AC | PRN
  Administered 2017-10-14: 1000 mL

## 2017-10-14 MED ORDER — LIDOCAINE HCL (PF) 1 % IJ SOLN
INTRAMUSCULAR | Status: DC | PRN
  Administered 2017-10-14: 2 mL

## 2017-10-14 MED ORDER — INSULIN ASPART 100 UNIT/ML ~~LOC~~ SOLN
0.0000 [IU] | Freq: Three times a day (TID) | SUBCUTANEOUS | Status: DC
  Administered 2017-10-15: 06:00:00 2 [IU] via SUBCUTANEOUS

## 2017-10-14 MED ORDER — HEPARIN SODIUM (PORCINE) 1000 UNIT/ML IJ SOLN
INTRAMUSCULAR | Status: DC | PRN
  Administered 2017-10-14: 4000 [IU] via INTRAVENOUS

## 2017-10-14 MED ORDER — VERAPAMIL HCL 2.5 MG/ML IV SOLN
INTRAVENOUS | Status: DC | PRN
  Administered 2017-10-14: 10 mL via INTRA_ARTERIAL

## 2017-10-14 MED ORDER — VERAPAMIL HCL 2.5 MG/ML IV SOLN
INTRAVENOUS | Status: AC
  Filled 2017-10-14: qty 2

## 2017-10-14 MED ORDER — IOPAMIDOL (ISOVUE-370) INJECTION 76%
INTRAVENOUS | Status: DC | PRN
  Administered 2017-10-14: 90 mL via INTRA_ARTERIAL

## 2017-10-14 MED ORDER — HEPARIN (PORCINE) IN NACL 2-0.9 UNIT/ML-% IJ SOLN
INTRAMUSCULAR | Status: AC
  Filled 2017-10-14: qty 1000

## 2017-10-14 MED ORDER — MIDAZOLAM HCL 2 MG/2ML IJ SOLN
INTRAMUSCULAR | Status: DC | PRN
  Administered 2017-10-14: 1 mg via INTRAVENOUS

## 2017-10-14 MED ORDER — LIDOCAINE HCL (PF) 1 % IJ SOLN
INTRAMUSCULAR | Status: AC
  Filled 2017-10-14: qty 30

## 2017-10-14 MED ORDER — SODIUM CHLORIDE 0.9% FLUSH: 3.0000 mL | INTRAVENOUS | Status: DC | PRN

## 2017-10-14 MED ORDER — SODIUM CHLORIDE 0.9% FLUSH
3.0000 mL | Freq: Two times a day (BID) | INTRAVENOUS | Status: DC
  Administered 2017-10-14 – 2017-10-18 (×7): 3 mL via INTRAVENOUS

## 2017-10-14 MED ORDER — FENTANYL CITRATE (PF) 100 MCG/2ML IJ SOLN
INTRAMUSCULAR | Status: AC
  Filled 2017-10-14: qty 2

## 2017-10-14 MED ORDER — HEPARIN SODIUM (PORCINE) 1000 UNIT/ML IJ SOLN
INTRAMUSCULAR | Status: AC
  Filled 2017-10-14: qty 1

## 2017-10-14 MED ORDER — HEPARIN SODIUM (PORCINE) 5000 UNIT/ML IJ SOLN
5000.0000 [IU] | Freq: Three times a day (TID) | INTRAMUSCULAR | Status: DC
  Administered 2017-10-14 – 2017-10-18 (×13): 5000 [IU] via SUBCUTANEOUS
  Filled 2017-10-14 (×15): qty 1

## 2017-10-14 MED ORDER — INSULIN ASPART 100 UNIT/ML ~~LOC~~ SOLN
0.0000 [IU] | Freq: Every day | SUBCUTANEOUS | Status: DC
  Administered 2017-10-14: 21:00:00 4 [IU] via SUBCUTANEOUS
  Administered 2017-10-16: 22:00:00 3 [IU] via SUBCUTANEOUS
  Administered 2017-10-17: 5 [IU] via SUBCUTANEOUS
  Administered 2017-10-18: 4 [IU] via SUBCUTANEOUS

## 2017-10-14 MED ORDER — SODIUM CHLORIDE 0.9 % IV SOLN: 250.0000 mL | INTRAVENOUS | Status: DC | PRN

## 2017-10-14 MED ORDER — IOPAMIDOL (ISOVUE-370) INJECTION 76%
INTRAVENOUS | Status: AC
  Filled 2017-10-14: qty 125

## 2017-10-14 SURGICAL SUPPLY — 12 items
CATH 5FR JL3.5 JR4 ANG PIG MP (CATHETERS) ×1 IMPLANT
COVER PRB 48X5XTLSCP FOLD TPE (BAG) IMPLANT
COVER PROBE 5X48 (BAG) ×2
DEVICE RAD COMP TR BAND LRG (VASCULAR PRODUCTS) ×1 IMPLANT
GLIDESHEATH SLEND SS 6F .021 (SHEATH) ×1 IMPLANT
GUIDEWIRE INQWIRE 1.5J.035X260 (WIRE) IMPLANT
INQWIRE 1.5J .035X260CM (WIRE) ×2
KIT HEART LEFT (KITS) ×2 IMPLANT
PACK CARDIAC CATHETERIZATION (CUSTOM PROCEDURE TRAY) ×2 IMPLANT
SYR MEDRAD MARK V 150ML (SYRINGE) ×2 IMPLANT
TRANSDUCER W/STOPCOCK (MISCELLANEOUS) ×2 IMPLANT
TUBING CIL FLEX 10 FLL-RA (TUBING) ×2 IMPLANT

## 2017-10-14 NOTE — Interval H&P Note (Signed)
History and Physical Interval Note:  10/14/2017 8:27 AM  [name]  has presented today for surgery, with the diagnosis of ns  The various methods of treatment have been discussed with the patient and family. After consideration of risks, benefits and other options for treatment, the patient has consented to  Procedure(s): LEFT HEART CATH AND CORONARY ANGIOGRAPHY (N/A) as a surgical intervention .  The patient's history has been reviewed, patient examined, no change in status, stable for surgery.  I have reviewed the patient's chart and labs.  Questions were answered to the patient's satisfaction.   Cath Lab Visit (complete for each Cath Lab visit)  Clinical Evaluation Leading to the Procedure:   ACS: No.  Non-ACS:    Anginal Classification: CCS I  Anti-ischemic medical therapy: Minimal Therapy (1 class of medications)  Non-Invasive Test Results: High-risk stress test findings: cardiac mortality >3%/year  Prior CABG: No previous CABG        Chelsea Primus 10/14/2017 8:27 AM

## 2017-10-14 NOTE — Progress Notes (Signed)
Progress Note  Patient Name: Courtnee Myer Date of Encounter: 10/14/2017  Primary Cardiologist: Dr. Debara Pickett  Subjective   She is asymptomatic.  She was seen shortly after catheterization this morning.  Digital images were reviewed.  Severe diffuse multivessel coronary disease is noted including in-stent restenosis of the previously placed Taxus in the distal RCA.  Patient is diabetic and smokes cigarettes.  Prior to admission with choledocholithiasis, she would have intermittent tightness and dyspnea with stair climbing.  Also complained of neck tightness.  Inpatient Medications    Scheduled Meds: . [START ON 10/15/2017] aspirin  81 mg Oral Daily  . atorvastatin  40 mg Oral q1800  . heparin  5,000 Units Subcutaneous Q8H  . insulin aspart  0-9 Units Subcutaneous TID WC  . metoprolol tartrate  25 mg Oral BID  . potassium chloride  40 mEq Oral BID  . sodium chloride flush  3 mL Intravenous Q12H   Continuous Infusions: . sodium chloride Stopped (10/14/17 4166)  . sodium chloride    . sodium chloride    . lactated ringers 75 mL/hr at 10/12/17 1004   PRN Meds: sodium chloride, morphine, ondansetron (ZOFRAN) IV, sodium chloride flush   Vital Signs    Vitals:   10/14/17 0850 10/14/17 0855 10/14/17 0900 10/14/17 0924  BP: (!) 141/82 136/76 133/68 (!) 141/73  Pulse: 68 70 68 64  Resp: (!) 23 (!) 23 17 15   Temp:    97.9 F (36.6 C)  TempSrc:    Oral  SpO2: 98% 98% 99% 97%  Weight:      Height:        Intake/Output Summary (Last 24 hours) at 10/14/2017 1008 Last data filed at 10/14/2017 0648 Gross per 24 hour  Intake 441.9 ml  Output 1600 ml  Net -1158.1 ml   Filed Weights   10/11/17 1700  Weight: 161 lb 9.6 oz (73.3 kg)    Physical Exam  Somewhat tearful. General: Somewhat pale appearing Skin: Pale Head: Normocephalic Neck: Neck veins are visible with the patient lying flat Lungs:Clear to auscultation and percussion Cardiovascular: No rub gallop or murmur.   Right radial cath site is unremarkable MSK: No joint swelling Extremities: Pulses are palpable bilaterally. Neuro: Intact exam post cath Psych: Appropriately anxious after identification of multivessel coronary disease by cath earlier this morning  Labs    Chemistry Recent Labs  Lab 10/12/17 0030 10/13/17 0450 10/14/17 0349  NA 139 139 138  K 3.8 3.1* 3.8  CL 107 104 104  CO2 22 23 23   GLUCOSE 85 146* 171*  BUN 6 5* 6  CREATININE 0.88 0.90 1.04*  CALCIUM 8.4* 9.0 8.8*  PROT 6.1* 6.0* 6.2*  ALBUMIN 2.5* 2.5* 2.6*  AST 20 20 38  ALT 32 28 38  ALKPHOS 153* 128* 121  BILITOT 0.9 0.6 0.6  GFRNONAA >60 >60 58*  GFRAA >60 >60 >60  ANIONGAP 10 12 11      Hematology Recent Labs  Lab 10/12/17 0030 10/13/17 0450 10/14/17 0349  WBC 9.5 5.4 6.5  RBC 3.66* 3.49* 3.64*  HGB 9.5* 9.1* 9.4*  HCT 30.0* 28.2* 29.7*  MCV 82.0 80.8 81.6  MCH 26.0 26.1 25.8*  MCHC 31.7 32.3 31.6  RDW 17.7* 18.3* 18.1*  PLT 277 292 289    Cardiac Enzymes Recent Labs  Lab 10/10/17 1848 10/10/17 2359 10/11/17 0328 10/11/17 1618  TROPONINI 0.18* 0.18* 0.14* 0.16*    Recent Labs  Lab 10/10/17 1828  TROPIPOC 0.11*  BNPNo results for input(s): BNP, PROBNP in the last 168 hours.   DDimer No results for input(s): DDIMER in the last 168 hours.   Radiology    Nm Myocar Multi W/planar W/wall Motion / Ef  Result Date: 10/13/2017  Horizontal ST segment depression ST segment depression was noted during stress in the I leads.  No T wave inversion was noted during stress.  The left ventricular ejection fraction is moderately decreased (30-44%).  This is a high risk study.  Findings consistent with prior myocardial infarction.  Baseline ECG with marked inferolateral T wave inversions Large distal anterior wall, apical, inferior apical and septal infarct no ischemia Diffuse hypokinesis worse in the apex EF 35% There is also TID at 1.2    Telemetry    Normal sinus rhythm with no ectopy-  Personally Reviewed  ECG    No new study- Personally Reviewed  Cardiac Studies   Echo 10/12/17 Study Conclusions  - Left ventricle: Septal apical and mid/apical inferior wall   hypokinesis. The cavity size was moderately dilated. Wall   thickness was increased in a pattern of moderate LVH. Systolic   function was normal. The estimated ejection fraction was 40%.   Wall motion was normal; there were no regional wall motion   abnormalities. Left ventricular diastolic function parameters   were normal. - Mitral valve: There was mild regurgitation. - Left atrium: The atrium was mildly dilated. - Atrial septum: A patent foramen ovale cannot be excluded.  Cardiac catheterization 10/14/2017: Coronary Diagrams   Diagnostic Diagram        Regional wall motion abnormality on left ventriculography with elevated LVEDP of 24 mmHg.  EF estimated at 35-40%.   Patient Profile     57 y.o. female with a hx of CAD c/b MI s/p PCI with remote RCA stent, DM2, HTN, depressionwho is followed by cardiology for the evaluation of ECG changes and positive troponin pre-procedurallyat the request of Antonietta Breach.   Assessment & Plan    1. Elevated troponin: Consistent with non-ST elevation myocardial infarction in the setting of severe three-vessel coronary artery disease.  Absence of symptoms may represent silent ischemia.  Optimize beta-blocker therapy.  Add long-acting nitrates.   2.  Catheterization documented severe three-vessel coronary disease in a patient with Hx of CAD s/p RCA stent placement in 2006:  -Needs coronary artery bypass grafting.  Have consulted T CTS.  3. Choledocholithiasis with pancreatitis awaiting surgical clearance: -We will need to discuss with general surgery.  Anesthesia risk is high especially if there is hypotension.  Need to determine if there is an opportunity to delay gallbladder surgery in an attempt to coordinate therapy and deal with significant underlying CAD  with silent ischemia.   Belva Crome III, PA-C 10/14/2017, 10:08 AM

## 2017-10-14 NOTE — Progress Notes (Signed)
TR BAND REMOVAL  LOCATION:    right radial  DEFLATED PER PROTOCOL:    Yes.    TIME BAND OFF / DRESSING APPLIED:    1100   SITE UPON ARRIVAL:    Level 0  SITE AFTER BAND REMOVAL:    Level 0  CIRCULATION SENSATION AND MOVEMENT:    Within Normal Limits   Yes.    COMMENTS:   Pt.tolerated welll

## 2017-10-14 NOTE — Consult Note (Addendum)
[address]       [place] [address]             [phone]        Victoria Eaton [place] Medical Record #[id number] Date of Birth: 01-03-1961  Referring: Dr Mallie Darting Primary Care: Leonie Douglas, PA-C Primary Cardiologist:No primary care provider on file.  Chief Complaint:    Chief Complaint  Patient presents with  . Abdominal Pain    History of Present Illness:     Patient is a 57 year old diabetic female with known coronary occlusive disease, status post myocardial infarction and stent placement in the right coronary artery in 2009 in [place].  2 weeks ago she was admitted to [facility] with fever back pain and treated for renal infection.  She returned 5 days ago to the hospital with 24 hours of epigastric pain, was found to have gallstone pancreatitis.  Simultaneously troponins were noted to be elevated, consistent with non-STEMI myocardial infarction.  Prior to cholecystectomy she underwent cardiac catheterization today.  Ejection fraction was noted to be depressed at 35%.   Cardiac surgery was asked to evaluate the patient for consideration of coronary artery bypass grafting.  She is just started back on solid diet, she notes that the epigastric pain has almost completely disappeared.  Current Activity/ Functional Status: Patient is independent with mobility/ambulation, transfers, ADL's, IADL's.   Zubrod Score: At the time of surgery this patient's most appropriate activity status/level should be described as: []     0    Normal activity, no symptoms [x]     1    Restricted in physical strenuous activity but ambulatory, able to do out light work []     2    Ambulatory and capable of self care, unable to do work activities, up and about                 more than 50%  Of the time                            []     3    Only limited self care, in bed greater than 50% of waking hours []     4    Completely disabled, no self care,  confined to bed or chair []     5    Moribund  Past Medical History:  Diagnosis Date  . Allergy   . Anxiety   . Arthritis   . Bladder prolapse, female, acquired 09/14/2017  . Depression   . Diabetes mellitus without complication ([place])   . History of stomach ulcers   . Hypertension   . Myocardial infarction ([place]) 12/31/2004    Past Surgical History:  Procedure Laterality Date  . CORONARY ANGIOPLASTY WITH STENT PLACEMENT  12/30/2004   Patient reported  . LEFT HEART CATH AND CORONARY ANGIOGRAPHY N/A 10/14/2017   Procedure: LEFT HEART CATH AND CORONARY ANGIOGRAPHY;  Surgeon: Martinique, Peter M, MD;  Location: [facility];  Service: Cardiovascular;  Laterality: N/A;    Social History   Tobacco Use  Smoking Status Current Every Day Smoker  . Packs/day: 0.50  . Years: 45.00  . Pack years: 22.50  . Types: Cigarettes  . Start date: 05/01/1972  Smokeless Tobacco Never Used    Social History   Substance and Sexual Activity  Alcohol Use Yes  . Alcohol/week: 12.0 oz  . Types: 20 Glasses of wine per week  Social History   Socioeconomic History  . Marital status: Married    Spouse name: Not on file  . Number of children:  1 child, daughter lives at home  Social Needs  . Transportation needs - non-medical:  Patient's daughter does drive a car her husband does not  Occupational History  .  Patient currently works at [employer] which does involve lifting up to 20-25 pounds She also works from home on [employer]  Tobacco Use  . Smoking status: Current Every Day Smoker    Packs/day: 0.50    Years: 45.00    Pack years: 22.50    Types: Cigarettes    Start date: 05/01/1972  . Smokeless tobacco: Never Used  Substance and Sexual Activity  . Alcohol use: Yes    Alcohol/week: 12.0 oz    Types: 20 Glasses of wine per week  . Drug use: No  . Sexual activity: No    Allergies  Allergen Reactions  . Kiwi Extract Hives  . Shrimp [Shellfish Allergy] Hives  .  Penicillins     Reaction as a child     Current Facility-Administered Medications  Medication Dose Route Frequency Provider Last Rate Last Dose  . 0.9 %  sodium chloride infusion   Intravenous Continuous Jeannett Senior, PA-C   Stopped at 10/14/17 [phone]  . 0.9 %  sodium chloride infusion  250 mL Intravenous PRN Martinique, Peter M, MD      . 0.9% sodium chloride infusion  1 mL/kg/hr Intravenous Continuous Martinique, Peter M, MD      . Derrill Memo ON 10/15/2017] aspirin chewable tablet 81 mg  81 mg Oral Daily Skeet Simmer, [facility]      . atorvastatin (LIPITOR) tablet 40 mg  40 mg Oral q1800 Nila Nephew, MD   40 mg at 10/13/17 1710  . heparin injection 5,000 Units  5,000 Units Subcutaneous Q8H Martinique, Peter M, MD      . insulin aspart (novoLOG) injection 0-9 Units  0-9 Units Subcutaneous TID WC Alphonzo Grieve, MD   2 Units at 10/14/17 0739  . lactated ringers infusion   Intravenous Continuous Ina Homes, MD 75 mL/hr at 10/12/17 1004    . metoprolol tartrate (LOPRESSOR) tablet 25 mg  25 mg Oral BID Nila Nephew, MD   25 mg at 10/13/17 2153  . morphine 4 MG/ML injection 4 mg  4 mg Intravenous Q2H PRN Alphonzo Grieve, MD   4 mg at 10/13/17 2201  . ondansetron (ZOFRAN) injection 4 mg  4 mg Intravenous Q8H PRN Colbert Ewing, MD   4 mg at 10/11/17 0002  . potassium chloride SA (K-DUR,KLOR-CON) CR tablet 40 mEq  40 mEq Oral BID Ina Homes, MD   40 mEq at 10/13/17 2153  . sodium chloride flush (NS) 0.9 % injection 3 mL  3 mL Intravenous Q12H Martinique, Peter M, MD      . sodium chloride flush (NS) 0.9 % injection 3 mL  3 mL Intravenous PRN Martinique, Peter M, MD        Medications Prior to Admission  Medication Sig Dispense Refill Last Dose  . amLODipine (NORVASC) 5 MG tablet Take 1 tablet (5 mg total) by mouth daily. 30 tablet 0 10/09/2017 at Unknown time  . aspirin 325 MG EC tablet Take 1 tablet (325 mg total) by mouth at bedtime. (Patient taking differently: Take 81 mg by mouth at bedtime. )  90 tablet 3 10/09/2017 at Unknown time  . atorvastatin (LIPITOR) 40 MG tablet Take 1 tablet (40  mg total) by mouth daily. 90 tablet 1 10/09/2017 at Unknown time  . buPROPion (WELLBUTRIN XL) 150 MG 24 hr tablet TAKE ONE TABLET BY MOUTH ONE TIME DAILY 90 tablet 0 10/09/2017 at Unknown time  . EPINEPHrine (EPIPEN 2-PAK) 0.3 mg/0.3 mL IJ SOAJ injection Inject 0.3 mLs (0.3 mg total) into the muscle once as needed. (Patient taking differently: Inject 0.3 mg into the muscle as needed (allergic reaction). ) 1 Device 1  at PRN  . FARXIGA 10 MG TABS tablet TAKE ONE TABLET BY MOUTH ONE TIME DAILY 90 tablet 0 10/10/2017 at Unknown time  . glipiZIDE (GLUCOTROL) 10 MG tablet TAKE ONE TABLET BY MOUTH TWICE A DAY BEFORE A MEAL 180 tablet 0 10/10/2017 at Unknown time  . lisinopril (PRINIVIL,ZESTRIL) 40 MG tablet Take 1 tablet (40 mg total) by mouth daily. 90 tablet 1 10/09/2017 at Unknown time  . loratadine (CLARITIN) 10 MG tablet Take 1 tablet (10 mg total) by mouth daily. 90 tablet 3 10/10/2017 at Unknown time  . metoprolol tartrate (LOPRESSOR) 25 MG tablet Take 1 tablet (25 mg total) by mouth 2 (two) times daily. 180 tablet 1 10/10/2017 at 0800  . Multiple Vitamin (MULTIVITAMIN WITH MINERALS) TABS tablet Take 1 tablet by mouth daily.   10/10/2017 at Unknown time  . Omega-3 Fatty Acids (FISH OIL CONCENTRATE) 300 MG CAPS Take 1 capsule by mouth daily.   10/10/2017 at Unknown time  . sertraline (ZOLOFT) 50 MG tablet Take 1 tablet (50 mg total) by mouth daily. 90 tablet 1 10/09/2017 at Unknown time  . sitaGLIPtin (JANUVIA) 100 MG tablet Take 1 tablet (100 mg total) by mouth daily. 90 tablet 1 10/10/2017 at Unknown time    Family History  Problem Relation Age of Onset  . Heart disease Father   . Hyperlipidemia Father   . Hypertension Father    Family history is significant for her father who had coronary artery bypass grafting at age 76 in 29s is still alive with congestive heart failure she has a sister with no health  problems  Review of Systems:   Review of Systems  Constitutional: Positive for chills and malaise/fatigue. Negative for diaphoresis, fever and weight loss.  HENT: Negative.   Eyes: Negative.   Respiratory: Negative for cough, hemoptysis, sputum production, shortness of breath and wheezing.   Cardiovascular: Negative for chest pain, palpitations, orthopnea, claudication, leg swelling and PND.  Gastrointestinal: Positive for abdominal pain, diarrhea, heartburn and nausea. Negative for blood in stool, constipation and melena.  Genitourinary: Negative.   Musculoskeletal: Negative.   Skin: Negative.   Neurological: Positive for weakness. Negative for sensory change, speech change, focal weakness, seizures and headaches.  Endo/Heme/Allergies: Negative.   Psychiatric/Behavioral: Negative.    Pertinent items are noted in HPI.        Physical Exam: BP (!) 146/75 (BP Location: Left Arm)   Pulse 71   Temp 98.2 F (36.8 C) (Oral)   Resp 20   Ht 5' 6"  (1.676 m)   Wt 161 lb 9.6 oz (73.3 kg)   SpO2 96%   BMI 26.08 kg/m    General appearance: alert, cooperative, appears older than stated age and no distress Head: Normocephalic, without obvious abnormality, atraumatic Neck: no adenopathy, no carotid bruit, no JVD, supple, symmetrical, trachea midline and thyroid not enlarged, symmetric, no tenderness/mass/nodules Lymph nodes: Cervical, supraclavicular, and axillary nodes normal. Resp: clear to auscultation bilaterally Back: symmetric, no curvature. ROM normal. No CVA tenderness. Cardio: regular rate and rhythm, S1, S2 normal, no  murmur, click, rub or gallop GI: soft, non-tender; bowel sounds normal; no masses,  no organomegaly Extremities: extremities normal, atraumatic, no cyanosis or edema and Homans sign is negative, no sign of DVT Neurologic: Grossly normal Right radial cath site is without hematoma Patient has palpable DP and PT pulses bilaterally Appears to have adequate vein for  coronary artery bypass grafting in both lower extremities  Diagnostic Studies & Laboratory data:     Recent Radiology Findings:  Dg Chest 2 View  Result Date: 10/02/2017 CLINICAL DATA:  57 year old female with fever and cough. EXAM: CHEST  2 VIEW COMPARISON:  None. FINDINGS: The lungs are clear. There is no pleural effusion or pneumothorax. The cardiac silhouette is within normal limits. Mild atherosclerotic calcification of the aortic arch. High attenuating content in the right upper abdomen most likely gallstones Atherosclerotic calcification of the aorta. IMPRESSION: 1. No acute cardiopulmonary process. 2. Probable gallstones. Electronically Signed   By: Anner Crete M.D.   On: 10/02/2017 01:00   Ct Abdomen Pelvis W Contrast  Result Date: 10/10/2017 CLINICAL DATA:  Pancreatitis. EXAM: CT ABDOMEN AND PELVIS WITH CONTRAST TECHNIQUE: Multidetector CT imaging of the abdomen and pelvis was performed using the standard protocol following bolus administration of intravenous contrast. CONTRAST:  189m ISOVUE-300 IOPAMIDOL (ISOVUE-300) INJECTION 61% COMPARISON:  06/21/2004 FINDINGS: Lower chest: No acute abnormality. Hepatobiliary: No suspicious liver abnormalities. Large cluster of stones within the dependent portion of the gallbladder measures 1.8 cm. No intrahepatic biliary dilatation. No common bile duct dilatation. Pancreas: Mild peripancreatic fat stranding identified compatible with acute pancreatitis. No pancreatic duct dilatation, pancreatic necrosis or pseudocyst. Spleen: Normal in size without focal abnormality. Adrenals/Urinary Tract: The adrenal glands are normal. Bilateral hydronephrosis identified. Bilateral hydroureter is also noted. Pelvic floor laxity with large cystocele identified, image 62 of series 7. Stomach/Bowel: Stomach is normal. Small bowel loops have a normal course and caliber without obstruction. The appendix is not visualized separate from the right lower quadrant bowel  loops. There is wall thickening and mild pericolonic fat stranding involving the descending colon and sigmoid colon suspicious for colitis. No complications identified. No pneumatosis, or perforation. No abscess. Vascular/Lymphatic: Aortic atherosclerosis. No aneurysm. No upper abdominal adenopathy. There is no pelvic or inguinal adenopathy. Reproductive: There is significant pelvic floor laxity. The uterus and adnexal structures are otherwise unremarkable. Other: No free fluid or fluid collections identified. Musculoskeletal: No aggressive lytic or sclerotic bone lesions. IMPRESSION: 1. Mild inflammatory changes involving the pancreas compatible with pancreatitis. No complications identified. 2. Multiple gallstones identified. No biliary dilatation and no specific findings to suggest choledocholithiasis. 3. Mild wall thickening involving the descending colon and sigmoid colon concerning for segmental colitis. Likely inflammatory or infectious. 4. Pelvic floor laxity with cystocele. Bilateral hydronephrosis and hydroureter is likely related to cystocele. 5.  Aortic Atherosclerosis (ICD10-I70.0). Electronically Signed   By: TKerby MoorsM.D.   On: 10/10/2017 20:19   Nm Myocar Multi W/planar W/wall Motion / Ef  Result Date: 10/13/2017  Horizontal ST segment depression ST segment depression was noted during stress in the I leads.  No T wave inversion was noted during stress.  The left ventricular ejection fraction is moderately decreased (30-44%).  This is a high risk study.  Findings consistent with prior myocardial infarction.  Baseline ECG with marked inferolateral T wave inversions Large distal anterior wall, apical, inferior apical and septal infarct no ischemia Diffuse hypokinesis worse in the apex EF 35% There is also TID at 1.2   I have independently reviewed the  above radiology studies  and reviewed the findings with the patient.     Recent Lab Findings: Lab Results  Component Value Date    WBC 5.6 10/14/2017   HGB 10.3 (L) 10/14/2017   HCT 30.8 (L) 10/14/2017   PLT 284 10/14/2017   GLUCOSE 171 (H) 10/14/2017   CHOL 137 10/10/2017   TRIG 144 10/10/2017   HDL 38 (L) 10/10/2017   LDLCALC 70 10/10/2017   ALT 38 10/14/2017   AST 38 10/14/2017   NA 138 10/14/2017   K 3.8 10/14/2017   CL 104 10/14/2017   CREATININE 0.98 10/14/2017   BUN 6 10/14/2017   CO2 23 10/14/2017   INR 1.01 10/10/2017   HGBA1C 8.2 09/07/2017   Echo 10/12/17 Study Conclusions  - Left ventricle: Septal apical and mid/apical inferior wall hypokinesis. The cavity size was moderately dilated. Wall thickness was increased in a pattern of moderate LVH. Systolic function was normal. The estimated ejection fraction was 40%. Wall motion was normal; there were no regional wall motion abnormalities. Left ventricular diastolic function parameters were normal. - Mitral valve: There was mild regurgitation. - Left atrium: The atrium was mildly dilated. - Atrial septum: A patent foramen ovale cannot be excluded.  Cardiac catheterization 10/14/2017: Coronary Diagrams   Diagnostic Diagram        Regional wall motion abnormality on left ventriculography with elevated LVEDP of 24 mmHg.  EF estimated at 35-40%.   I have independently reviewed the above  cath films and reviewed the findings with the  patient .   Lab Results  Component Value Date   TROPONINI 0.16 ([place]) 10/11/2017     Assessment / Plan:   Acute Gallstone pancreatitis- awaiting cholecystectomy  Acute Myocardial infarction non stimi- non-ST elevation myocardial infarction in the setting of severe three-vessel coronary artery disease.  s/p PCIwith remote RCA stent DM2 HTN Depression Tobacco abuse  Recent admission for fever to 103 and chills treated for renal infection 2 weeks ago  I have reviewed the patient's history and cardiac cath findings.  With her presentation with non-STEMI myocardial infarction and severe  three-vessel coronary artery disease coronary artery bypass grafting would be the most beneficial treatment for her .  Her recovering from recent pancreatitis does increase the risk of surgery to some degree.  I discussed with her potentially proceeding with coronary artery bypass grafting on Monday, January 28 as long as her and was symptoms of pancreatitis remain quiescent  And lipase/amylase remain normal.  Risks and options of surgery were discussed with her in detail she is willing to proceed.     I  spent 60 minutes counseling the patient face to face.   Grace Isaac MD      [address] [place], [address] Office [phone]   Beeper [phone]  10/14/2017 4:09 PM

## 2017-10-14 NOTE — Progress Notes (Signed)
   CC: abdominal pain/chest pain  Subjective: No further pain.  Had heart cath today  Objective: Vital signs in last 24 hours: Temp:  [97.9 F (36.6 C)-98.8 F (37.1 C)] 97.9 F (36.6 C) (01/23 0924) Pulse Rate:  [64-83] 64 (01/23 0924) Resp:  [12-23] 15 (01/23 0924) BP: (132-149)/(64-84) 141/73 (01/23 0924) SpO2:  [95 %-100 %] 97 % (01/23 0924) Last BM Date: 10/09/17 222 PO 219IV 1600 urine Afebrile, VSS Labs OK Cath shows significant cardiac disease and EF 35-40%  Intake/Output from previous day: 01/22 0701 - 01/23 0700 In: 441.9 [P.O.:222; I.V.:219.9] Out: 1600 [Urine:1600] Intake/Output this shift: No intake/output data recorded.   Constitutional: No acute distress, conversant, appears states age. Eyes: Anicteric sclerae, moist conjunctiva, no lid lag Lungs: Clear to auscultation bilaterally, normal respiratory effort CV: regular rate and rhythm, no murmurs, no peripheral edema, pedal pulses 2+ GI: Soft, no masses or hepatosplenomegaly, non-tender to palpation Skin: No rashes, palpation reveals normal turgor Psychiatric: appropriate judgment and insight, oriented to person, place, and time    Lab Results:  Recent Labs    10/13/17 0450 10/14/17 0349  WBC 5.4 6.5  HGB 9.1* 9.4*  HCT 28.2* 29.7*  PLT 292 289    BMET Recent Labs    10/13/17 0450 10/14/17 0349  NA 139 138  K 3.1* 3.8  CL 104 104  CO2 23 23  GLUCOSE 146* 171*  BUN 5* 6  CREATININE 0.90 1.04*  CALCIUM 9.0 8.8*   PT/INR No results for input(s): LABPROT, INR in the last 72 hours.  Recent Labs  Lab 10/10/17 1753 10/11/17 0328 10/12/17 0030 10/13/17 0450 10/14/17 0349  AST 26 20 20 20  38  ALT 49 40 32 28 38  ALKPHOS 231* 184* 153* 128* 121  BILITOT 0.6 0.6 0.9 0.6 0.6  PROT 7.8 6.9 6.1* 6.0* 6.2*  ALBUMIN 3.1* 2.7* 2.5* 2.5* 2.6*     Lipase     Component Value Date/Time   LIPASE 27 10/14/2017 0349     Medications: . [START ON 10/15/2017] aspirin  81 mg Oral Daily  .  atorvastatin  40 mg Oral q1800  . heparin  5,000 Units Subcutaneous Q8H  . insulin aspart  0-9 Units Subcutaneous TID WC  . metoprolol tartrate  25 mg Oral BID  . potassium chloride  40 mEq Oral BID  . sodium chloride flush  3 mL Intravenous Q12H    Assessment/Plan Pancreatitis with multiple gallstones - biliary pancreatitis   CAD Hospitalized 1/11-1/12/19 with pyelonephritis Type II diabetes Hypertension Hypokalemia - resolved Cystocele - Bilateral hydronephrosis/Hydroureter    FEN:  IV fluids/NPO ID:  None DVT:  Heparin Foley:  None Follow up:  [facility]  Plan:  Reviewed results with Dr. Tamala Julian and Dr. Rosendo Gros.  Cardiac issue take precedence over her gallstone pancreatitis which was mild.  Her pancreatitis has resolved.  We will see her after she is over her cardiac issues, and plan to do her electively.          LOS: 4 days    JENNINGS,WILLARD 10/14/2017 [phone]

## 2017-10-14 NOTE — Progress Notes (Signed)
Patient transferred to cardiac cath lab at 0810. Belongings brought to patient's new room, 6C-06.

## 2017-10-14 NOTE — Progress Notes (Signed)
   Subjective: Patient is doing well this AM. She states that she was able to eat a plate of fruit and some yogurt yesterday without any increase in abdominal pain or nausea/vomiting. She states that cardiology came by to talked with her and she understands that she will be taken for a left heart catheter today. We discussed that depending on what needs to be done during her heart cath will determine when her cholecystectomy can be done. She states that she will be taken for the cath around 8 AM. We will see her after the procedure. All questions and concerns addressed.  Objective: Vital signs in last 24 hours: Vitals:   10/13/17 1010 10/13/17 1012 10/13/17 1154 10/13/17 2100  BP: 132/67 137/70 (!) 149/69 (!) 146/70  Pulse:   83 70  Resp:   19 16  Temp:   98 F (36.7 C) 98.8 F (37.1 C)  TempSrc:   Oral Oral  SpO2:   100% 95%  Weight:      Height:       General: Well-nourished female in no acute distress Pulm: Good air movement with no wheezing or crackles CV: Regular rate and rhythm, no murmurs, no rubs Abdomen: Active bowel sounds, soft, nondistended, no tenderness to palpation Extremities: No lower extremity edema.  Assessment/Plan:  Victoria Eaton is a 57 year old female with CAD status post drug-eluting stent to the RCA in 2006, hypertension, type 2 diabetes mellitus, uterine prolapse, PUD, and anxiety/depression who presented with epigastric abdominal pain and elevated troponins. Patient was subsequently found to have pancreatitis. Her abdominal pain has resolved and she tolerated oral intake yesterday evening. She will go for left heart cath today. Further management as outlined below.  GallstonePancreatitis -Presented with epigastric pain, lipase 187, and findings on CT abdomen consistent with pancreatitis. -Alk phos continues to trend down. Hemoglobin dropped to 9.4 today. We will continue to monitor with daily labs. -Continue maintenance fluids -Continue pain  management with morphine 4 mg every 2 hours as needed. Will transition to PO pain medications after cath -Continue IV Zofran for nausea control -Surgery on board, awaiting cardiology clearance prior to cholecystectomy  Type IINSTEMI CAD status post drug-eluting stent to the RCA in 2016 -Troponin trend:0.11 -> 0.18 -> 0.18 -> 0.14 - Myoview showing horizontal ST segment depression in lead I, LVEF decreased to 30-44%, and findings consistent with prior myocardial infarct. High risk study. - Cardiology onboard. Will take patient for a left heart cath today   Type 2 diabetes mellitus -Sliding scale insulin   Hypertension -Home regimen includes lisinopril 40 mg daily, metoprolol 25 mg twice daily, and amlodipine 5 mg daily -Holding the lisinopril and amlodipine. Continue metoprolol  Cystocele with bilateral hydronephrosis and hydroureter -Stable  Hypokalemia.Repleteing   Dispo: Anticipated discharge pending clinical improvement.   Ina Homes, MD 10/14/2017, 5:35 AM My Pager: [phone]

## 2017-10-15 ENCOUNTER — Inpatient Hospital Stay (HOSPITAL_COMMUNITY): Payer: BLUE CROSS/BLUE SHIELD

## 2017-10-15 DIAGNOSIS — Z01810 Encounter for preprocedural cardiovascular examination: Secondary | ICD-10-CM

## 2017-10-15 DIAGNOSIS — N1330 Unspecified hydronephrosis: Secondary | ICD-10-CM

## 2017-10-15 DIAGNOSIS — E119 Type 2 diabetes mellitus without complications: Secondary | ICD-10-CM

## 2017-10-15 DIAGNOSIS — Z7982 Long term (current) use of aspirin: Secondary | ICD-10-CM

## 2017-10-15 DIAGNOSIS — Z79899 Other long term (current) drug therapy: Secondary | ICD-10-CM

## 2017-10-15 DIAGNOSIS — Z8711 Personal history of peptic ulcer disease: Secondary | ICD-10-CM

## 2017-10-15 DIAGNOSIS — F329 Major depressive disorder, single episode, unspecified: Secondary | ICD-10-CM

## 2017-10-15 DIAGNOSIS — Z794 Long term (current) use of insulin: Secondary | ICD-10-CM

## 2017-10-15 DIAGNOSIS — I10 Essential (primary) hypertension: Secondary | ICD-10-CM

## 2017-10-15 DIAGNOSIS — F419 Anxiety disorder, unspecified: Secondary | ICD-10-CM

## 2017-10-15 DIAGNOSIS — Z955 Presence of coronary angioplasty implant and graft: Secondary | ICD-10-CM

## 2017-10-15 DIAGNOSIS — N814 Uterovaginal prolapse, unspecified: Secondary | ICD-10-CM

## 2017-10-15 LAB — COMPREHENSIVE METABOLIC PANEL
ALT: 43 U/L (ref 14–54)
ANION GAP: 10 (ref 5–15)
AST: 40 U/L (ref 15–41)
Albumin: 2.8 g/dL — ABNORMAL LOW (ref 3.5–5.0)
Alkaline Phosphatase: 131 U/L — ABNORMAL HIGH (ref 38–126)
BUN: 9 mg/dL (ref 6–20)
CHLORIDE: 104 mmol/L (ref 101–111)
CO2: 23 mmol/L (ref 22–32)
Calcium: 9 mg/dL (ref 8.9–10.3)
Creatinine, Ser: 0.97 mg/dL (ref 0.44–1.00)
Glucose, Bld: 156 mg/dL — ABNORMAL HIGH (ref 65–99)
POTASSIUM: 3.9 mmol/L (ref 3.5–5.1)
Sodium: 137 mmol/L (ref 135–145)
Total Bilirubin: 0.5 mg/dL (ref 0.3–1.2)
Total Protein: 6.6 g/dL (ref 6.5–8.1)

## 2017-10-15 LAB — GLUCOSE, CAPILLARY
GLUCOSE-CAPILLARY: 255 mg/dL — AB (ref 65–99)
GLUCOSE-CAPILLARY: 304 mg/dL — AB (ref 65–99)
Glucose-Capillary: 179 mg/dL — ABNORMAL HIGH (ref 65–99)
Glucose-Capillary: 93 mg/dL (ref 65–99)

## 2017-10-15 MED ORDER — AMLODIPINE BESYLATE 10 MG PO TABS
10.0000 mg | ORAL_TABLET | Freq: Every day | ORAL | Status: DC
  Administered 2017-10-15 – 2017-10-18 (×4): 10 mg via ORAL
  Filled 2017-10-15 (×3): qty 1

## 2017-10-15 MED ORDER — INSULIN ASPART 100 UNIT/ML ~~LOC~~ SOLN: 0.0000 [IU] | Freq: Every day | SUBCUTANEOUS | Status: DC

## 2017-10-15 MED ORDER — OXYCODONE HCL 5 MG PO TABS
5.0000 mg | ORAL_TABLET | ORAL | Status: DC | PRN
  Administered 2017-10-15: 5 mg via ORAL
  Filled 2017-10-15: qty 1

## 2017-10-15 MED ORDER — ATORVASTATIN CALCIUM 80 MG PO TABS
80.0000 mg | ORAL_TABLET | Freq: Every day | ORAL | Status: DC
  Administered 2017-10-15 – 2017-10-24 (×9): 80 mg via ORAL
  Filled 2017-10-15 (×10): qty 1

## 2017-10-15 MED ORDER — ASPIRIN 81 MG PO CHEW
81.0000 mg | CHEWABLE_TABLET | Freq: Every day | ORAL | Status: DC
  Administered 2017-10-15 – 2017-10-18 (×4): 81 mg via ORAL
  Filled 2017-10-15 (×4): qty 1

## 2017-10-15 MED ORDER — AMLODIPINE BESYLATE 5 MG PO TABS
5.0000 mg | ORAL_TABLET | Freq: Every day | ORAL | Status: DC
  Filled 2017-10-15: qty 1

## 2017-10-15 MED ORDER — LISINOPRIL 40 MG PO TABS
40.0000 mg | ORAL_TABLET | Freq: Every day | ORAL | Status: DC
  Administered 2017-10-15 – 2017-10-18 (×4): 40 mg via ORAL
  Filled 2017-10-15 (×4): qty 1

## 2017-10-15 MED ORDER — INSULIN ASPART 100 UNIT/ML ~~LOC~~ SOLN
0.0000 [IU] | Freq: Three times a day (TID) | SUBCUTANEOUS | Status: DC
  Administered 2017-10-15: 11 [IU] via SUBCUTANEOUS
  Administered 2017-10-16: 18:00:00 2 [IU] via SUBCUTANEOUS
  Administered 2017-10-16: 13:00:00 11 [IU] via SUBCUTANEOUS
  Administered 2017-10-16: 07:00:00 3 [IU] via SUBCUTANEOUS
  Administered 2017-10-17: 07:00:00 5 [IU] via SUBCUTANEOUS

## 2017-10-15 NOTE — Progress Notes (Signed)
CARDIAC REHAB PHASE I   PRE:  Rate/Rhythm: 76 SR    BP: sitting 144/73    SaO2: 97 RA  MODE:  Ambulation: 1000 ft   POST:  Rate/Rhythm: 98 SR    BP: sitting 169/87     SaO2:   Tolerated well, enjoyed walk. She can walk independently as long as no sx (we discussed this). Discussed sternal precautions, IS (1750 mL), mobility, and d/c planning. She voiced understanding, will have family at home. We also discussed smoking cessation. She accepted fake cigarette and is processing quitting. Will discuss further before d/c. Will f/u as low priority. [place], ACSM 10/15/2017 9:06 AM

## 2017-10-15 NOTE — Progress Notes (Signed)
The patient has been seen in conjunction with Kathyrn Drown, NP. All aspects of care have been considered and discussed. The patient has been personally interviewed, examined, and all clinical data has been reviewed.   Clinically stable from a coronary standpoint.  Gallbladder surgery will be deferred until after bypass surgery.  Heart healthy diet.  Monitor for recurrence of pancreatitis symptoms.  Cardiac markers were flat.  She did not have ACS.    After speaking with patient, it appears that plan is to remain in hospital daily when CABG will be performed.  She is getting presurgical workup done starting today.    Progress Note  Patient Name: Victoria Eaton Date of Encounter: 10/15/2017  Primary Cardiologist: Dr. Debara Pickett  Subjective   Pt is in better spirits today. She is ready to proceed with surgery and is mostly concerned with being able to shower today. Denies chest pain. Minimal abdominal pain.   Inpatient Medications    Scheduled Meds: . amLODipine  5 mg Oral Daily  . atorvastatin  40 mg Oral q1800  . heparin  5,000 Units Subcutaneous Q8H  . insulin aspart  0-5 Units Subcutaneous QHS  . insulin aspart  0-9 Units Subcutaneous TID WC  . lisinopril  40 mg Oral Daily  . metoprolol tartrate  25 mg Oral BID  . potassium chloride  40 mEq Oral BID  . sodium chloride flush  3 mL Intravenous Q12H   Continuous Infusions: . sodium chloride Stopped (10/14/17 5701)  . sodium chloride     PRN Meds: sodium chloride, ondansetron (ZOFRAN) IV, oxyCODONE, sodium chloride flush   Vital Signs    Vitals:   10/14/17 1100 10/14/17 1527 10/14/17 2020 10/15/17 0615  BP: 129/62 (!) 146/75 (!) 159/72 (!) 161/81  Pulse: 73 71 79 72  Resp: 16 20 (!) 22 16  Temp:  98.2 F (36.8 C) 98.2 F (36.8 C) 98.3 F (36.8 C)  TempSrc:  Oral Oral Oral  SpO2: 98% 96% 94% 96%  Weight:    150 lb 5.7 oz (68.2 kg)  Height:        Intake/Output Summary (Last 24 hours) at 10/15/2017 0748 Last  data filed at 10/14/2017 1900 Gross per 24 hour  Intake 1376.35 ml  Output -  Net 1376.35 ml   Filed Weights   10/11/17 1700 10/15/17 0615  Weight: 161 lb 9.6 oz (73.3 kg) 150 lb 5.7 oz (68.2 kg)    Physical Exam   General: Well developed, well nourished, NAD Skin: Warm, dry, intact  Head: Normocephalic, atraumatic, clear, moist mucus membranes. Neck: Negative for carotid bruits. No JVD Lungs:Clear to ausculation bilaterally. No wheezes, rales, or rhonchi. Breathing is unlabored. Cardiovascular: RRR with S1 S2. No murmurs, rubs, gallops, or LV heave appreciated. Abdomen: Soft, non-tender, non-distended with normoactive bowel sounds. No obvious abdominal masses. MSK: Strength and tone appear normal for age. 5/5 in all extremities Extremities: No edema. No clubbing or cyanosis. DP/PT pulses 2+ bilaterally Neuro: Alert and oriented. No focal deficits. No facial asymmetry. MAE spontaneously. Psych: Responds to questions appropriately with normal affect.    Labs    Chemistry Recent Labs  Lab 10/13/17 0450 10/14/17 0349 10/14/17 1004 10/15/17 0242  NA 139 138  --  137  K 3.1* 3.8  --  3.9  CL 104 104  --  104  CO2 23 23  --  23  GLUCOSE 146* 171*  --  156*  BUN 5* 6  --  9  CREATININE 0.90 1.04*  0.98 0.97  CALCIUM 9.0 8.8*  --  9.0  PROT 6.0* 6.2*  --  6.6  ALBUMIN 2.5* 2.6*  --  2.8*  AST 20 38  --  40  ALT 28 38  --  43  ALKPHOS 128* 121  --  131*  BILITOT 0.6 0.6  --  0.5  GFRNONAA >60 58* >60 >60  GFRAA >60 >60 >60 >60  ANIONGAP 12 11  --  10     Hematology Recent Labs  Lab 10/13/17 0450 10/14/17 0349 10/14/17 1004  WBC 5.4 6.5 5.6  RBC 3.49* 3.64* 3.79*  HGB 9.1* 9.4* 10.3*  HCT 28.2* 29.7* 30.8*  MCV 80.8 81.6 81.3  MCH 26.1 25.8* 27.2  MCHC 32.3 31.6 33.4  RDW 18.3* 18.1* 18.0*  PLT 292 289 284    Cardiac Enzymes Recent Labs  Lab 10/10/17 1848 10/10/17 2359 10/11/17 0328 10/11/17 1618  TROPONINI 0.18* 0.18* 0.14* 0.16*    Recent Labs    Lab 10/10/17 1828  TROPIPOC 0.11*     BNPNo results for input(s): BNP, PROBNP in the last 168 hours.   DDimer No results for input(s): DDIMER in the last 168 hours.   Radiology    Nm Myocar Multi W/planar W/wall Motion / Ef  Result Date: 10/13/2017  Horizontal ST segment depression ST segment depression was noted during stress in the I leads.  No T wave inversion was noted during stress.  The left ventricular ejection fraction is moderately decreased (30-44%).  This is a high risk study.  Findings consistent with prior myocardial infarction.  Baseline ECG with marked inferolateral T wave inversions Large distal anterior wall, apical, inferior apical and septal infarct no ischemia Diffuse hypokinesis worse in the apex EF 35% There is also TID at 1.2    Telemetry    10/15/17 NSR - Personally Reviewed  ECG    10/13/17 NSR with baseline ST changes in III, V4-6- Personally Reviewed  Cardiac Studies   Cath 10/14/17:  Prox RCA lesion is 80% stenosed.  Mid RCA to Dist RCA lesion is 70% stenosed.  Dist RCA lesion is 70% stenosed.  Prox LAD to Mid LAD lesion is 90% stenosed.  Mid LAD to Dist LAD lesion is 85% stenosed.  Ost 1st Diag lesion is 80% stenosed.  Lat 1st Diag lesion is 90% stenosed.  1st Diag lesion is 90% stenosed.  Ost 1st Mrg to 1st Mrg lesion is 90% stenosed.  There is moderate left ventricular systolic dysfunction.  LV end diastolic pressure is mildly elevated.  The left ventricular ejection fraction is 35-45% by visual estimate.   1. Severe 3 vessel obstructive CAD 2. Moderate LV dysfunction 3. Mildly elevated LVEDP  Plan: Discussed with Dr. Daneen Schick. Will need to discuss with general surgery for management of gallbladder disease. Patient at high risk for general surgery. Will ultimately need to be considered for CABG.    Echo 10/12/17: Study Conclusions  - Left ventricle: Septal apical and mid/apical inferior wall   hypokinesis. The  cavity size was moderately dilated. Wall   thickness was increased in a pattern of moderate LVH. Systolic   function was normal. The estimated ejection fraction was 40%.   Wall motion was normal; there were no regional wall motion   abnormalities. Left ventricular diastolic function parameters   were normal. - Mitral valve: There was mild regurgitation. - Left atrium: The atrium was mildly dilated. - Atrial septum: A patent foramen ovale cannot be excluded.  Patient Profile  56 y.o. female with a hx of CAD s/p remote MI with PCIto RCA (2009), DM2, HTN, depression and acute pancreatitis who is followed by cardiology for the evaluation of ECG changes and positive troponin pre-procedurallyat the request of Antonietta Breach.Lexiscan Myoview completed 10/13/17 was considered high risk, with subsequent cardiac cath completed 10/14/17. Cath revealed severe 3-vessel obstructive disease. TCTS was consulted with plans to proceed tentatively with CABG Monday, 10/19/17. Per notes,general surgery will re-convene once cardiac surgery has been completed.   Assessment & Plan    1. NSTEMI: -S/P cardiac cath 10/14/17 (see results above)>>>three-vessel CAD with consult to TCTS for CABG -Preoperative workup to be completed  -Tentative plan for Monday, 10/19/17 -Denies chest pain or associated symptoms -Cardiac rehab today  -On statin, BB  2. Choledocholithiasis with pancreatitis awaiting surgical clearance: -Per general surgery, given that her pancreatitis has resolved and she is stable, will readdress the need for surgery once cleared from a cardiac perspective and most likely proceed with elective surgery at that time.   3. DM II: -Per IM -On sensitive SSI, glucose remains elevated  -Adjusted to moderate sensitivity scale for greater control especially in the the pre-surgical setting  -Will follow  4. HTN: -Elevated, 144/73>161/81>159/72 -On amlodipine 5, lisinopril 40 , metoprolol 25 -Will  increase amlodipine to 10 and monitor   Signed, Kathyrn Drown NP-C [facility] Pager: [phone] 10/15/2017, 7:48 AM   For questions or updates, please contact   Please consult [email or web address] for contact info under Cardiology/STEMI.

## 2017-10-15 NOTE — Progress Notes (Signed)
   Subjective: She is doing well this morning. She states that she has been able to tolerate a diet without any recurrence in her epigastric pain or nausea/vomiting. She states that she had a conversation with cardiothoracic surgery yesterday and the plan is to proceed with a CABG on Monday. She does not have any questions or concerns at this point.  Objective: Vital signs in last 24 hours: Vitals:   10/14/17 1051 10/14/17 1100 10/14/17 1527 10/14/17 2020  BP: (!) 129/111 129/62 (!) 146/75 (!) 159/72  Pulse: 75 73 71 79  Resp: _0 (!) 22  Temp: (!) 97.3 F (36.3 C)  98.2 F (36.8 C) 98.2 F (36.8 C)  TempSrc: Oral  Oral Oral  SpO2: 100% 98% 96% 94%  Weight:      Height:       General: Well-nourished female in no acute distress Pulm: Good air movement with no wheezing or crackles CV: Regular rate and rhythm, no murmurs, no rubs Abdomen: Active bowel sounds, soft, nondistended, no tenderness to palpation Extremities: No lower extremity edema  Assessment/Plan:  Victoria Eaton is a 57 year old female with CAD status post drug-eluting stent to the RCA in 2006, hypertension, type 2 diabetes mellitus, uterine prolapse, PUD, and anxiety/depression who presented with epigastric abdominal pain and elevated troponins. Patient was subsequently found to have pancreatitis and NSTEMI. She was taken for cath that illustrated multivessel disease and need for CABG. Her abdominal pain has resolved and she tolerated oral intake yesterday evening.Further management as outlined below.  NSTEMI CAD status post drug-eluting stent to the RCA in 2016 -Left heart cath showing diffuse 3 vessel disease  - Cardiology onboard  - ASA 81 mg QD - Cardiothoracic surgery has evaluated the patient and plan for a CABG on 10/19/17  GallstonePancreatitis -Presented with epigastric pain, lipase 187, and findings on CT abdomen consistent with pancreatitis. -Alk phos is slightly elevated today. All other labs  stable  - Discontinue Morphine and given Oxy IR 25m every 4 hours as needed.  -Continue IV Zofran for nausea control -Cholecystectomy in future after CABG  Type 2 diabetes mellitus -Sliding scale insulin   Hypertension -Restart home medications including lisinopril 40 mg daily, metoprolol 25 mg twice daily, and amlodipine 5 mg daily  Cystocele with bilateral hydronephrosis and hydroureter -Stable  Hypokalemia.Repleteing  Dispo: Anticipated discharge pending clinical improvement and after CABG.   HIna Homes MD 10/15/2017, 6:13 AM My Pager: [phone]

## 2017-10-15 NOTE — Progress Notes (Addendum)
Pre-op Cardiac Surgery  Carotid Findings:  Right ICA demonstrates a 1-39% stenosis, the proximal left ICA demonstrates a 40-59% stenosis.  Bilateral vertebral arteries appear patent and antegrade.  Upper Extremity Right Left  Brachial Pressures 136, Tri 129, Tri  Radial Waveforms Tri Tri  Ulnar Waveforms Tri Tri  Palmar Arch (Allen's Test) waveform is nearly unchanged with radial compression and obliterates with ulnar compression.   waveform increases slightly with radial compresssion and decreases greater than 50% with ulnar compression.       Lower  Extremity Right Left  Dorsalis Pedis 125, Tri 141, Tri  Posterior Tibial 124, Tri audible signal but unable to demonstrate clear picture  Ankle/Brachial Indices 0.91 1.03    Findings:  Right ABI demonstrates mild PAD, left ABI may be unreliable.  Lita Mains- RDMS, RVT 3:47 PM  10/15/2017

## 2017-10-15 NOTE — Care Management Note (Addendum)
Case Management Note  Patient Details  Name: Victoria Eaton MRN: [id number] Date of Birth: [birth year]  Subjective/Objective:    From home, pta indep,  S/p cath, shows 3 vessel dz, plan for CABG on Monday.  Her daughter will be with her after surgery to assist her.  She states her [employer] has expired, and she is in the process of applying for new insurance.  She would like to be set up with the [facility].  She states she was going to [facility] urgent care but would like to get an apt at [facility] clinic if she can.   NCM will also assist her with a Match Letter at discharge if needed. Will need to call and schedule a follow up apt when close to discharge.                   Action/Plan: For CABG on Monday.  Expected Discharge Date:                  Expected Discharge Plan:  Home/Self Care  In-House Referral:     Discharge planning Services  CM Consult, Medication Assistance, [facility]  Post Acute Care Choice:    Choice offered to:     DME Arranged:    DME Agency:     HH Arranged:    HH Agency:     Status of Service:  In process, will continue to follow  If discussed at Long Length of Stay Meetings, dates discussed:    Additional Comments:  Zenon Mayo, RN 10/15/2017, 10:38 AM

## 2017-10-16 ENCOUNTER — Inpatient Hospital Stay (HOSPITAL_COMMUNITY): Payer: BLUE CROSS/BLUE SHIELD

## 2017-10-16 DIAGNOSIS — I2510 Atherosclerotic heart disease of native coronary artery without angina pectoris: Secondary | ICD-10-CM

## 2017-10-16 DIAGNOSIS — R51 Headache: Secondary | ICD-10-CM

## 2017-10-16 DIAGNOSIS — F418 Other specified anxiety disorders: Secondary | ICD-10-CM

## 2017-10-16 LAB — COMPREHENSIVE METABOLIC PANEL
ALT: 50 U/L (ref 14–54)
AST: 46 U/L — ABNORMAL HIGH (ref 15–41)
Albumin: 3 g/dL — ABNORMAL LOW (ref 3.5–5.0)
Alkaline Phosphatase: 125 U/L (ref 38–126)
Anion gap: 9 (ref 5–15)
BUN: 9 mg/dL (ref 6–20)
CO2: 24 mmol/L (ref 22–32)
Calcium: 9.3 mg/dL (ref 8.9–10.3)
Chloride: 105 mmol/L (ref 101–111)
Creatinine, Ser: 1.1 mg/dL — ABNORMAL HIGH (ref 0.44–1.00)
GFR calc Af Amer: >60 mL/min (ref >60)
GFR calc non Af Amer: 54 mL/min — ABNORMAL LOW (ref >60)
Glucose, Bld: 183 mg/dL — ABNORMAL HIGH (ref 65–99)
Potassium: 4.8 mmol/L (ref 3.5–5.1)
Sodium: 138 mmol/L (ref 135–145)
Total Bilirubin: 0.6 mg/dL (ref 0.3–1.2)
Total Protein: 7.1 g/dL (ref 6.5–8.1)

## 2017-10-16 LAB — PULMONARY FUNCTION TEST
DL/VA % pred: 75 %
DL/VA: 3.81 ml/min/mmHg/L
DLCO cor % pred: 57 %
DLCO cor: 15.47 ml/min/mmHg
DLCO unc % pred: 51 %
DLCO unc: 13.89 ml/min/mmHg
FEF 25-75 Post: 2.74 L/sec
FEF 25-75 Pre: 3.8 L/sec
FEF2575-%Change-Post: -27 %
FEF2575-%Pred-Post: 108 %
FEF2575-%Pred-Pre: 150 %
FEV1-%Change-Post: -7 %
FEV1-%Pred-Post: 84 %
FEV1-%Pred-Pre: 92 %
FEV1-Post: 2.36 L
FEV1-Pre: 2.56 L
FEV1FVC-%Change-Post: -4 %
FEV1FVC-%Pred-Pre: 114 %
FEV6-%Change-Post: -8 %
FEV6-%Pred-Post: 75 %
FEV6-%Pred-Pre: 82 %
FEV6-Post: 2.63 L
FEV6-Pre: 2.87 L
FEV6FVC-%Pred-Post: 103 %
FEV6FVC-%Pred-Pre: 103 %
FVC-%Change-Post: -2 %
FVC-%Pred-Post: 77 %
FVC-%Pred-Pre: 80 %
FVC-Post: 2.79 L
FVC-Pre: 2.87 L
Post FEV1/FVC ratio: 85 %
Post FEV6/FVC ratio: 100 %
Pre FEV1/FVC ratio: 89 %
Pre FEV6/FVC Ratio: 100 %
RV % pred: 85 %
RV: 1.76 L
TLC % pred: 87 %
TLC: 4.69 L

## 2017-10-16 LAB — LIPASE, BLOOD: Lipase: 29 U/L (ref 11–51)

## 2017-10-16 LAB — GLUCOSE, CAPILLARY
GLUCOSE-CAPILLARY: 318 mg/dL — AB (ref 65–99)
GLUCOSE-CAPILLARY: 148 mg/dL — AB (ref 65–99)
GLUCOSE-CAPILLARY: 280 mg/dL — AB (ref 65–99)
Glucose-Capillary: 170 mg/dL — ABNORMAL HIGH (ref 65–99)

## 2017-10-16 LAB — CBC
HCT: 32 % — ABNORMAL LOW (ref 36.0–46.0)
Hemoglobin: 10.5 g/dL — ABNORMAL LOW (ref 12.0–15.0)
MCH: 26.8 pg (ref 26.0–34.0)
MCHC: 32.8 g/dL (ref 30.0–36.0)
MCV: 81.6 fL (ref 78.0–100.0)
Platelets: 273 10*3/uL (ref 150–400)
RBC: 3.92 MIL/uL (ref 3.87–5.11)
RDW: 18 % — ABNORMAL HIGH (ref 11.5–15.5)
WBC: 6.3 10*3/uL (ref 4.0–10.5)

## 2017-10-16 LAB — AMYLASE: Amylase: 25 U/L — ABNORMAL LOW (ref 28–100)

## 2017-10-16 MED ORDER — ACETAMINOPHEN 325 MG PO TABS
650.0000 mg | ORAL_TABLET | Freq: Four times a day (QID) | ORAL | Status: DC | PRN
  Administered 2017-10-16: 08:00:00 650 mg via ORAL
  Filled 2017-10-16: qty 2

## 2017-10-16 MED ORDER — ALBUTEROL SULFATE (2.5 MG/3ML) 0.083% IN NEBU
2.5000 mg | INHALATION_SOLUTION | Freq: Once | RESPIRATORY_TRACT | Status: AC
  Administered 2017-10-16: 08:00:00 2.5 mg via RESPIRATORY_TRACT

## 2017-10-16 NOTE — Progress Notes (Signed)
CARDIAC REHAB PHASE I   PRE:  Rate/Rhythm: 68 SR    BP: sitting 104/57    SaO2:   MODE:  Ambulation: 1000 ft   POST:  Rate/Rhythm: 85 SR    BP: sitting 133/47     SaO2:   Quick pace, no c/o, feels good. Encouraged walking and IS. 1500-1510   Darrick Meigs CES, ACSM 10/16/2017 3:09 PM

## 2017-10-16 NOTE — Progress Notes (Signed)
Progress Note  Patient Name: Victoria Eaton Date of Encounter: 10/16/2017  Primary Cardiologist: Dr. Debara Pickett  Subjective   No cardiac or gastrointestinal complaints.  Pre-coronary bypass surgery workup is being done by the surgical team.  Inpatient Medications    Scheduled Meds: . amLODipine  10 mg Oral Daily  . aspirin  81 mg Oral Daily  . atorvastatin  80 mg Oral q1800  . heparin  5,000 Units Subcutaneous Q8H  . insulin aspart  0-15 Units Subcutaneous TID WC  . insulin aspart  0-5 Units Subcutaneous QHS  . lisinopril  40 mg Oral Daily  . metoprolol tartrate  25 mg Oral BID  . sodium chloride flush  3 mL Intravenous Q12H   Continuous Infusions: . sodium chloride Stopped (10/14/17 0569)  . sodium chloride     PRN Meds: sodium chloride, acetaminophen, ondansetron (ZOFRAN) IV, sodium chloride flush   Vital Signs    Vitals:   10/15/17 1505 10/15/17 1945 10/16/17 0304 10/16/17 0800  BP: (!) 133/59 140/73 (!) 148/65 135/73  Pulse: 66 82 63 80  Resp: 12 (!) 21 17 18   Temp: 98.2 F (36.8 C) 97.9 F (36.6 C) 98 F (36.7 C) 97.8 F (36.6 C)  TempSrc: Oral Oral Oral Oral  SpO2: 97% 100% 94% 96%  Weight:      Height:        Intake/Output Summary (Last 24 hours) at 10/16/2017 1046 Last data filed at 10/16/2017 0800 Gross per 24 hour  Intake 360 ml  Output 1100 ml  Net -740 ml   Filed Weights   10/11/17 1700 10/15/17 0615  Weight: 161 lb 9.6 oz (73.3 kg) 150 lb 5.7 oz (68.2 kg)    Physical Exam   No JVD is noted Chest is clear Cardiac exam reveals no gallop or murmur. No peripheral edema Neuro is intact  Labs    Chemistry Recent Labs  Lab 10/14/17 0349 10/14/17 1004 10/15/17 0242 10/16/17 0229  NA 138  --  137 138  K 3.8  --  3.9 4.8  CL 104  --  104 105  CO2 23  --  23 24  GLUCOSE 171*  --  156* 183*  BUN 6  --  9 9  CREATININE 1.04* 0.98 0.97 1.10*  CALCIUM 8.8*  --  9.0 9.3  PROT 6.2*  --  6.6 7.1  ALBUMIN 2.6*  --  2.8* 3.0*  AST 38   --  40 46*  ALT 38  --  43 50  ALKPHOS 121  --  131* 125  BILITOT 0.6  --  0.5 0.6  GFRNONAA 58* >60 >60 54*  GFRAA >60 >60 >60 >60  ANIONGAP 11  --  10 9     Hematology Recent Labs  Lab 10/14/17 0349 10/14/17 1004 10/16/17 0229  WBC 6.5 5.6 6.3  RBC 3.64* 3.79* 3.92  HGB 9.4* 10.3* 10.5*  HCT 29.7* 30.8* 32.0*  MCV 81.6 81.3 81.6  MCH 25.8* 27.2 26.8  MCHC 31.6 33.4 32.8  RDW 18.1* 18.0* 18.0*  PLT 289 284 273    Cardiac Enzymes Recent Labs  Lab 10/10/17 1848 10/10/17 2359 10/11/17 0328 10/11/17 1618  TROPONINI 0.18* 0.18* 0.14* 0.16*    Recent Labs  Lab 10/10/17 1828  TROPIPOC 0.11*     BNPNo results for input(s): BNP, PROBNP in the last 168 hours.   DDimer No results for input(s): DDIMER in the last 168 hours.   Radiology    No results found.  Telemetry     NSR - Personally Reviewed  ECG    No new data- Personally Reviewed  Cardiac Studies   Cath 10/14/17:  Prox RCA lesion is 80% stenosed.  Mid RCA to Dist RCA lesion is 70% stenosed.  Dist RCA lesion is 70% stenosed.  Prox LAD to Mid LAD lesion is 90% stenosed.  Mid LAD to Dist LAD lesion is 85% stenosed.  Ost 1st Diag lesion is 80% stenosed.  Lat 1st Diag lesion is 90% stenosed.  1st Diag lesion is 90% stenosed.  Ost 1st Mrg to 1st Mrg lesion is 90% stenosed.  There is moderate left ventricular systolic dysfunction.  LV end diastolic pressure is mildly elevated.  The left ventricular ejection fraction is 35-45% by visual estimate.   1. Severe 3 vessel obstructive CAD 2. Moderate LV dysfunction 3. Mildly elevated LVEDP  Plan: Discussed with Dr. Daneen Schick. Will need to discuss with general surgery for management of gallbladder disease. Patient at high risk for general surgery. Will ultimately need to be considered for CABG.    Echo 10/12/17: Study Conclusions  - Left ventricle: Septal apical and mid/apical inferior wall   hypokinesis. The cavity size was moderately  dilated. Wall   thickness was increased in a pattern of moderate LVH. Systolic   function was normal. The estimated ejection fraction was 40%.   Wall motion was normal; there were no regional wall motion   abnormalities. Left ventricular diastolic function parameters   were normal. - Mitral valve: There was mild regurgitation. - Left atrium: The atrium was mildly dilated. - Atrial septum: A patent foramen ovale cannot be excluded.  Patient Profile     57 y.o. female with a hx of CAD s/p remote MI with PCIto RCA (2009), DM2, HTN, depression and acute pancreatitis who is followed by cardiology for the evaluation of ECG changes and positive troponin pre-procedurallyat the request of Antonietta Breach.Lexiscan Myoview completed 10/13/17 was considered high risk, with subsequent cardiac cath completed 10/14/17. Cath revealed severe 3-vessel obstructive disease. TCTS was consulted with plans to proceed tentatively with CABG Monday, 10/19/17. Per notes,general surgery will re-convene once cardiac surgery has been completed.   Assessment & Plan    1. NSTEMI: Troponin elevation in the setting of pancreatitis.  No true ACS.  Stress of pancreatitis on top of severe underlying coronary disease led to troponin leak as a secondary phenomenon.  2. Choledocholithiasis with pancreatitis: Asymptomatic and we will plan gallbladder surgery after he recovers from coronary bypass grafting in several months.  3.  Severe multivessel coronary artery disease, trace enzyme elevation during stress of pancreatitis, awaiting coronary artery bypass grafting 10/19/2017.  Pre-CABG workup is in progress.  Signed, Kathyrn Drown NP-C [facility] Pager: [phone] 10/16/2017, 10:46 AM   For questions or updates, please contact   Please consult [email or web address] for contact info under Cardiology/STEMI.

## 2017-10-16 NOTE — Progress Notes (Signed)
Patient ID: Victoria Eaton, female   DOB: 1961-05-17, 57 y.o.   MRN: [id number]      [address]       [place] [address]             [phone]                 2 Days Post-Op Procedure(s) (LRB): LEFT HEART CATH AND CORONARY ANGIOGRAPHY (N/A)  LOS: 6 days   Subjective: Patient taking a p.o. diet without abdominal pain.  Amylase and lipase levels are normal today she denies any anginal.  Objective: Vital signs in last 24 hours: Patient Vitals for the past 24 hrs:  BP Temp Temp src Pulse Resp SpO2  10/16/17 1540 - 98.1 F (36.7 C) Oral - - 98 %  10/16/17 1211 (!) 104/53 98.1 F (36.7 C) Oral 60 16 97 %  10/16/17 0800 135/73 97.8 F (36.6 C) Oral 80 18 96 %  10/16/17 0304 (!) 148/65 98 F (36.7 C) Oral 63 17 94 %  10/15/17 1945 140/73 97.9 F (36.6 C) Oral 82 (!) 21 100 %    Filed Weights   10/11/17 1700 10/15/17 0615  Weight: 161 lb 9.6 oz (73.3 kg) 150 lb 5.7 oz (68.2 kg)    Hemodynamic parameters for last 24 hours:    Intake/Output from previous day: 01/24 0701 - 01/25 0700 In: 480 [P.O.:480] Out: 1100 [Urine:1100] Intake/Output this shift: Total I/O In: 120 [P.O.:120] Out: -   Scheduled Meds: . amLODipine  10 mg Oral Daily  . aspirin  81 mg Oral Daily  . atorvastatin  80 mg Oral q1800  . heparin  5,000 Units Subcutaneous Q8H  . insulin aspart  0-15 Units Subcutaneous TID WC  . insulin aspart  0-5 Units Subcutaneous QHS  . lisinopril  40 mg Oral Daily  . metoprolol tartrate  25 mg Oral BID  . sodium chloride flush  3 mL Intravenous Q12H   Continuous Infusions: . sodium chloride Stopped (10/14/17 2841)  . sodium chloride     PRN Meds:.sodium chloride, acetaminophen, ondansetron (ZOFRAN) IV, sodium chloride flush  General appearance: alert and cooperative Neurologic: intact Heart: regular rate and rhythm, S1, S2 normal, no murmur, click, rub or gallop Lungs: clear to auscultation bilaterally Abdomen: soft, non-tender; bowel sounds  normal; no masses,  no organomegaly Extremities: extremities normal, atraumatic, no cyanosis or edema and Homans sign is negative, no sign of DVT  Lab Results: CBC: Recent Labs    10/14/17 1004 10/16/17 0229  WBC 5.6 6.3  HGB 10.3* 10.5*  HCT 30.8* 32.0*  PLT 284 273   BMET:  Recent Labs    10/15/17 0242 10/16/17 0229  NA 137 138  K 3.9 4.8  CL 104 105  CO2 23 24  GLUCOSE 156* 183*  BUN 9 9  CREATININE 0.97 1.10*  CALCIUM 9.0 9.3    PT/INR: No results for input(s): LABPROT, INR in the last 72 hours.   Radiology No results found.   Assessment/Plan: S/P Procedure(s) (LRB): LEFT HEART CATH AND CORONARY ANGIOGRAPHY (N/A)   With the patient's pancreatitis symptoms resolved, amylase and lipase normal, and significant three-vessel coronary artery disease I recommended to the patient that we proceed with coronary artery bypass grafting.  Risks and options have been discussed with her in detail and she is willing to proceed.  Patient and her daughter's questions have been answered she is currently scheduled for Monday , 7:30 AM. Preop orders have been left.  Percell Miller  Maryruth Bun MD 10/16/2017 5:46 PM

## 2017-10-16 NOTE — Progress Notes (Signed)
   Subjective: Patient doing well this morning. States that she does have a headache but otherwise is tolerating oral intake without change in epigastric pain, or nausea/vomiting. We discussed that we are continuing to hold the course until Monday. He voices understanding. Is asking whether she is allowed to get up and walk.  She is not feeling weak or lightheaded or feel like her gait is disturbed. All questions and concerns addressed.  Objective: Vital signs in last 24 hours: Vitals:   10/15/17 1122 10/15/17 1505 10/15/17 1945 10/16/17 0304  BP: (!) 155/78 (!) 133/59 140/73 (!) 148/65  Pulse: 68 66 82 63  Resp: 17 12 (!) 21 17  Temp: 97.9 F (36.6 C) 98.2 F (36.8 C) 97.9 F (36.6 C) 98 F (36.7 C)  TempSrc: Oral Oral Oral Oral  SpO2: 97% 97% 100% 94%  Weight:      Height:       General: Well-nourished female in no acute distress Pulm: Good air movement with no wheezing or crackles CV: Regular rate and rhythm, no murmurs, no rubs Abdomen: Active bowel sounds, soft, nondistended, no tenderness to palpation  Assessment/Plan:  Lavella Myren is a 57 year old female with CAD status post drug-eluting stent to the RCA in 2006, hypertension, type 2 diabetes mellitus, uterine prolapse, PUD, and anxiety/depression who presented with epigastric abdominal pain and elevated troponins.Patient was subsequently found to have pancreatitis and NSTEMI. She was taken for cath that illustrated multivessel disease and need for CABG. Her abdominal pain has resolved and she tolerated oral intake yesterday evening.Further management as outlined below.  NSTEMI CAD status post drug-eluting stent to the RCA in 2016 -Left heart cath showing diffuse 3 vessel disease  - Cardiology onboard  - ASA 81 mg QD - Cardiothoracic surgery has evaluated the patient and plan for a CABG on 10/19/17  GallstonePancreatitis -Presented with epigastric pain, lipase 187, and findings on CT abdomen consistent with  pancreatitis. -Abdominal pain has resolved and patient is tolerating PO intake -Cholecystectomy in future after CABG  Type 2 diabetes mellitus -Sliding scale insulin   Hypertension -Lisinopril 40 mg daily, metoprolol 25 mg twice daily, and amlodipine 5 mg daily  Cystocele with bilateral hydronephrosis and hydroureter -Stable  Hypokalemia.Resolved  Dispo: Anticipated discharge in >1 day(s).   Ina Homes, MD 10/16/2017, 5:56 AM My Pager: [phone]

## 2017-10-17 DIAGNOSIS — Z7984 Long term (current) use of oral hypoglycemic drugs: Secondary | ICD-10-CM

## 2017-10-17 LAB — GLUCOSE, CAPILLARY
Glucose-Capillary: 206 mg/dL — ABNORMAL HIGH (ref 65–99)
Glucose-Capillary: 250 mg/dL — ABNORMAL HIGH (ref 65–99)
Glucose-Capillary: 99 mg/dL (ref 65–99)
Glucose-Capillary: 371 mg/dL — ABNORMAL HIGH (ref 65–99)

## 2017-10-17 LAB — URINALYSIS, ROUTINE W REFLEX MICROSCOPIC
BILIRUBIN URINE: NEGATIVE
GLUCOSE, UA: NEGATIVE mg/dL
Hgb urine dipstick: NEGATIVE
Ketones, ur: NEGATIVE mg/dL
Leukocytes, UA: NEGATIVE
NITRITE: NEGATIVE
Protein, ur: NEGATIVE mg/dL
SPECIFIC GRAVITY, URINE: 1.005 (ref 1.005–1.030)
pH: 6 (ref 5.0–8.0)

## 2017-10-17 MED ORDER — INSULIN ASPART 100 UNIT/ML ~~LOC~~ SOLN
0.0000 [IU] | Freq: Three times a day (TID) | SUBCUTANEOUS | Status: DC
  Administered 2017-10-17: 12:00:00 7 [IU] via SUBCUTANEOUS
  Administered 2017-10-18: 11 [IU] via SUBCUTANEOUS
  Administered 2017-10-18: 7 [IU] via SUBCUTANEOUS
  Administered 2017-10-18: 4 [IU] via SUBCUTANEOUS

## 2017-10-17 NOTE — Progress Notes (Signed)
   Subjective: Patient doing well this morning. She denies any chest pain or shortness of breath. Has been ambulating without issue. Feels better after shower. No abdominal pain.  Objective: Vital signs in last 24 hours: Vitals:   10/16/17 1540 10/16/17 1932 10/17/17 0431 10/17/17 0812  BP:  (!) 122/55 (!) 119/37 (!) 109/48  Pulse:  67 64 69  Resp:  19 17 14   Temp: 98.1 F (36.7 C) 98.1 F (36.7 C) 98.1 F (36.7 C) 98.3 F (36.8 C)  TempSrc: Oral Oral Oral Oral  SpO2: 98% 98% 96% 94%  Weight:   155 lb 13.8 oz (70.7 kg)   Height:       General: Well-nourished female in no acute distress, resting comfortably in bed. Pulm: Good air movement throughout with no wheezing or crackles CV: Regular rate and rhythm, no murmurs, no rubs Abdomen: Active bowel sounds, soft, nondistended, no tenderness to palpation  Assessment/Plan: Victoria Eaton is a 57 year old female with CAD s/p drug-eluting stent to RCA in 2006, hypertension, type 2 diabetes mellitus, uterine prolapse, PUD, and anxiety/depression who presented 1/19 with epigastric abdominal pain and elevated troponins.Patient was subsequently found to have pancreatitis and NSTEMI. She was taken for cath that illustrated multivessel disease and need for CABG which is tentatively planned for 1/28. Her abdominal pain has resolved and she is tolerating PO intake.Further management as outlined below.  NSTEMI CAD status post drug-eluting stent to the RCA in 2016 Left heart cath showing diffuse 3 vessel disease, felt to be best managed with CABG. Tentatively scheduled for 10/19/17. Suspect CVTS will assume care as primary following CABG but will double check. She is without CP or SOB.  -Cardiology on -ASA 81 mg QD -CABG per CVTS 1/28  GallstonePancreatitis Presented with epigastric pain, lipase 187, and findings on CT abdomen consistent with pancreatitis. Abdominal pain has resolved and she is tolerating PO. She will need Cholecystectomy in  future following CABG.   Type 2 diabetes mellitus CBG 170-318 over past 24 hours while on moderate sliding scale insulin. She is on Sitagliptin, Farxiga and Glipizide at home. Will increase sliding scale to resistant.   Hypertension BP Controlled on Lisinopril 40 mg daily, metoprolol 25 mg twice daily, and amlodipine 10 mg daily  Dispo: Anticipated discharge in >1 day(s).   Victoria Herbig, DO 10/17/2017, 10:13 AM My Pager: [phone]

## 2017-10-17 NOTE — Progress Notes (Signed)
Progress Note  Patient Name: Victoria Eaton Date of Encounter: 10/17/2017  Primary Cardiologist: Pixie Casino, MD   Subjective   No chest pain or dyspnea; denies abdominal pain  Inpatient Medications    Scheduled Meds: . amLODipine  10 mg Oral Daily  . aspirin  81 mg Oral Daily  . atorvastatin  80 mg Oral q1800  . heparin  5,000 Units Subcutaneous Q8H  . insulin aspart  0-15 Units Subcutaneous TID WC  . insulin aspart  0-5 Units Subcutaneous QHS  . lisinopril  40 mg Oral Daily  . metoprolol tartrate  25 mg Oral BID  . sodium chloride flush  3 mL Intravenous Q12H   Continuous Infusions: . sodium chloride Stopped (10/14/17 9024)  . sodium chloride     PRN Meds: sodium chloride, acetaminophen, ondansetron (ZOFRAN) IV, sodium chloride flush   Vital Signs    Vitals:   10/16/17 1540 10/16/17 1932 10/17/17 0431 10/17/17 0812  BP:  (!) 122/55 (!) 119/37 (!) 109/48  Pulse:  67 64 69  Resp:  19 17 14   Temp: 98.1 F (36.7 C) 98.1 F (36.7 C) 98.1 F (36.7 C) 98.3 F (36.8 C)  TempSrc: Oral Oral Oral Oral  SpO2: 98% 98% 96% 94%  Weight:   155 lb 13.8 oz (70.7 kg)   Height:        Intake/Output Summary (Last 24 hours) at 10/17/2017 0818 Last data filed at 10/17/2017 0733 Gross per 24 hour  Intake 480 ml  Output 950 ml  Net -470 ml   Filed Weights   10/11/17 1700 10/15/17 0615 10/17/17 0431  Weight: 161 lb 9.6 oz (73.3 kg) 150 lb 5.7 oz (68.2 kg) 155 lb 13.8 oz (70.7 kg)    Telemetry    Sinus Personally Reviewed   Physical Exam   GEN: No acute distress.   Neck: No JVD Cardiac: RRR, no murmurs, rubs, or gallops.  Respiratory: Clear to auscultation bilaterally. GI: Soft, nontender, non-distended  MS: No edema; radial cath site with no hematoma Neuro:  Nonfocal  Psych: Normal affect   Labs    Chemistry Recent Labs  Lab 10/14/17 0349 10/14/17 1004 10/15/17 0242 10/16/17 0229  NA 138  --  137 138  K 3.8  --  3.9 4.8  CL 104  --  104 105  CO2  23  --  23 24  GLUCOSE 171*  --  156* 183*  BUN 6  --  9 9  CREATININE 1.04* 0.98 0.97 1.10*  CALCIUM 8.8*  --  9.0 9.3  PROT 6.2*  --  6.6 7.1  ALBUMIN 2.6*  --  2.8* 3.0*  AST 38  --  40 46*  ALT 38  --  43 50  ALKPHOS 121  --  131* 125  BILITOT 0.6  --  0.5 0.6  GFRNONAA 58* >60 >60 54*  GFRAA >60 >60 >60 >60  ANIONGAP 11  --  10 9     Hematology Recent Labs  Lab 10/14/17 0349 10/14/17 1004 10/16/17 0229  WBC 6.5 5.6 6.3  RBC 3.64* 3.79* 3.92  HGB 9.4* 10.3* 10.5*  HCT 29.7* 30.8* 32.0*  MCV 81.6 81.3 81.6  MCH 25.8* 27.2 26.8  MCHC 31.6 33.4 32.8  RDW 18.1* 18.0* 18.0*  PLT 289 284 273    Cardiac Enzymes Recent Labs  Lab 10/10/17 1848 10/10/17 2359 10/11/17 0328 10/11/17 1618  TROPONINI 0.18* 0.18* 0.14* 0.16*    Recent Labs  Lab 10/10/17 1828  TROPIPOC  0.11*      Patient Profile     57 y.o. female with a hx of CAD s/p remote MI with PCIto RCA (2009), DM2, HTN, depression and acute pancreatitis who is followed by cardiology for the evaluation of ECG changes and positive troponin pre-procedurallyat the request of Antonietta Breach.Lexiscan Myoview completed 10/13/17 was considered high risk, with subsequent cardiac cath completed 10/14/17. Cath revealed severe 3-vessel obstructive disease. TCTS was consulted with plans to proceed tentatively with CABG Monday, 10/19/17. Per notes,general surgery will re-convene once cardiac surgery has been completed.     Assessment & Plan    1 coronary artery disease-patient with severe coronary disease.  Plan is for coronary artery bypass and graft on Monday.  Continue aspirin, statin and beta-blocker.  2 recent pancreatitis-amylase has normalized.  No significant abdominal pain.  Gallbladder will be addressed once coronary artery bypass and graft complete.  3 ischemic cardiomyopathy-continue beta-blocker and ACE inhibitor.   For questions or updates, please contact [place] Please consult [email or web address] for  contact info under Cardiology/STEMI.      Signed, Kirk Ruths, MD  10/17/2017, 8:18 AM

## 2017-10-18 LAB — CBC
HCT: 32.2 % — ABNORMAL LOW (ref 36.0–46.0)
Hemoglobin: 10.3 g/dL — ABNORMAL LOW (ref 12.0–15.0)
MCH: 26.5 pg (ref 26.0–34.0)
MCHC: 32 g/dL (ref 30.0–36.0)
MCV: 83 fL (ref 78.0–100.0)
Platelets: 260 10*3/uL (ref 150–400)
RBC: 3.88 MIL/uL (ref 3.87–5.11)
RDW: 18.5 % — ABNORMAL HIGH (ref 11.5–15.5)
WBC: 5.5 10*3/uL (ref 4.0–10.5)

## 2017-10-18 LAB — BASIC METABOLIC PANEL
Anion gap: 9 (ref 5–15)
BUN: 22 mg/dL — ABNORMAL HIGH (ref 6–20)
CO2: 24 mmol/L (ref 22–32)
Calcium: 9.1 mg/dL (ref 8.9–10.3)
Chloride: 99 mmol/L — ABNORMAL LOW (ref 101–111)
Creatinine, Ser: 1.14 mg/dL — ABNORMAL HIGH (ref 0.44–1.00)
GFR calc Af Amer: >60 mL/min (ref >60)
GFR calc non Af Amer: 52 mL/min — ABNORMAL LOW (ref >60)
Glucose, Bld: 326 mg/dL — ABNORMAL HIGH (ref 65–99)
Potassium: 4.3 mmol/L (ref 3.5–5.1)
Sodium: 132 mmol/L — ABNORMAL LOW (ref 135–145)

## 2017-10-18 LAB — GLUCOSE, CAPILLARY
GLUCOSE-CAPILLARY: 223 mg/dL — AB (ref 65–99)
GLUCOSE-CAPILLARY: 338 mg/dL — AB (ref 65–99)
Glucose-Capillary: 255 mg/dL — ABNORMAL HIGH (ref 65–99)
Glucose-Capillary: 154 mg/dL — ABNORMAL HIGH (ref 65–99)

## 2017-10-18 LAB — HEMOGLOBIN A1C
Hgb A1c MFr Bld: 8.5 % — ABNORMAL HIGH (ref 4.8–5.6)
Mean Plasma Glucose: 197.25 mg/dL

## 2017-10-18 LAB — ABO/RH: ABO/RH(D): A POS

## 2017-10-18 MED ORDER — PLASMA-LYTE 148 IV SOLN
INTRAVENOUS | Status: DC
  Filled 2017-10-18: qty 2.5

## 2017-10-18 MED ORDER — SODIUM CHLORIDE 0.9 % IV SOLN
1.5000 mg/kg/h | INTRAVENOUS | Status: AC
  Administered 2017-10-19: 1.5 mg/kg/h via INTRAVENOUS
  Filled 2017-10-18: qty 25

## 2017-10-18 MED ORDER — DEXMEDETOMIDINE HCL IN NACL 400 MCG/100ML IV SOLN
0.1000 ug/kg/h | INTRAVENOUS | Status: AC
  Administered 2017-10-19: 0.7 ug/kg/h via INTRAVENOUS
  Filled 2017-10-18: qty 100

## 2017-10-18 MED ORDER — CHLORHEXIDINE GLUCONATE 0.12 % MT SOLN
15.0000 mL | Freq: Once | OROMUCOSAL | Status: AC
  Administered 2017-10-18: 15 mL via OROMUCOSAL
  Filled 2017-10-18: qty 15

## 2017-10-18 MED ORDER — METOPROLOL TARTRATE 12.5 MG HALF TABLET
12.5000 mg | ORAL_TABLET | Freq: Once | ORAL | Status: AC
  Administered 2017-10-19: 12.5 mg via ORAL
  Filled 2017-10-18: qty 1

## 2017-10-18 MED ORDER — SODIUM CHLORIDE 0.9 % IV SOLN
INTRAVENOUS | Status: AC
  Administered 2017-10-19: 3.7 [IU]/h via INTRAVENOUS
  Filled 2017-10-18: qty 1

## 2017-10-18 MED ORDER — CHLORHEXIDINE GLUCONATE CLOTH 2 % EX PADS
6.0000 | MEDICATED_PAD | Freq: Once | CUTANEOUS | Status: AC
  Administered 2017-10-19: 6 via TOPICAL

## 2017-10-18 MED ORDER — VANCOMYCIN HCL 10 G IV SOLR
1250.0000 mg | INTRAVENOUS | Status: AC
  Administered 2017-10-19: 1250 mg via INTRAVENOUS
  Filled 2017-10-18: qty 1250

## 2017-10-18 MED ORDER — MAGNESIUM SULFATE 50 % IJ SOLN
40.0000 meq | INTRAMUSCULAR | Status: DC
  Filled 2017-10-18: qty 9.85

## 2017-10-18 MED ORDER — DOPAMINE-DEXTROSE 3.2-5 MG/ML-% IV SOLN
0.0000 ug/kg/min | INTRAVENOUS | Status: DC
  Filled 2017-10-18: qty 250

## 2017-10-18 MED ORDER — SODIUM CHLORIDE 0.9 % IV SOLN
30.0000 ug/min | INTRAVENOUS | Status: AC
  Administered 2017-10-19: 25 ug/min via INTRAVENOUS
  Filled 2017-10-18: qty 2

## 2017-10-18 MED ORDER — TEMAZEPAM 15 MG PO CAPS
15.0000 mg | ORAL_CAPSULE | Freq: Once | ORAL | Status: AC | PRN
  Administered 2017-10-18: 15 mg via ORAL
  Filled 2017-10-18: qty 1

## 2017-10-18 MED ORDER — TRANEXAMIC ACID (OHS) BOLUS VIA INFUSION
15.0000 mg/kg | INTRAVENOUS | Status: AC
  Administered 2017-10-19: 1014 mg via INTRAVENOUS
  Filled 2017-10-18: qty 1014

## 2017-10-18 MED ORDER — EPINEPHRINE PF 1 MG/ML IJ SOLN
0.0000 ug/min | INTRAVENOUS | Status: DC
  Filled 2017-10-18: qty 4

## 2017-10-18 MED ORDER — CHLORHEXIDINE GLUCONATE CLOTH 2 % EX PADS
6.0000 | MEDICATED_PAD | Freq: Once | CUTANEOUS | Status: AC
  Administered 2017-10-18: 6 via TOPICAL

## 2017-10-18 MED ORDER — NITROGLYCERIN IN D5W 200-5 MCG/ML-% IV SOLN
2.0000 ug/min | INTRAVENOUS | Status: AC
  Administered 2017-10-19: 16.6 ug/min via INTRAVENOUS
  Filled 2017-10-18: qty 250

## 2017-10-18 MED ORDER — SODIUM CHLORIDE 0.9 % IV SOLN
INTRAVENOUS | Status: DC
  Filled 2017-10-18: qty 30

## 2017-10-18 MED ORDER — LEVOFLOXACIN IN D5W 500 MG/100ML IV SOLN
500.0000 mg | INTRAVENOUS | Status: AC
  Administered 2017-10-19: 500 mg via INTRAVENOUS
  Filled 2017-10-18: qty 100

## 2017-10-18 MED ORDER — TRANEXAMIC ACID (OHS) PUMP PRIME SOLUTION
2.0000 mg/kg | INTRAVENOUS | Status: DC
  Filled 2017-10-18: qty 1.35

## 2017-10-18 MED ORDER — POTASSIUM CHLORIDE 2 MEQ/ML IV SOLN
80.0000 meq | INTRAVENOUS | Status: DC
  Filled 2017-10-18: qty 40

## 2017-10-18 MED ORDER — BISACODYL 5 MG PO TBEC: 5.0000 mg | DELAYED_RELEASE_TABLET | Freq: Once | ORAL | Status: DC

## 2017-10-18 NOTE — Progress Notes (Signed)
Patient for CABG Monday, no new cardiology recs today. Call with questions.   Carlyle Dolly MD

## 2017-10-18 NOTE — Anesthesia Preprocedure Evaluation (Addendum)
Anesthesia Evaluation  Patient identified by MRN, date of birth, ID band Patient awake    Reviewed: Allergy & Precautions, NPO status , Patient's Chart, lab work & pertinent test results, reviewed documented beta blocker date and time   Airway Mallampati: II  TM Distance: >3 FB Neck ROM: Full    Dental  (+) Dental Advisory Given   Pulmonary Current Smoker,    Pulmonary exam normal breath sounds clear to auscultation       Cardiovascular hypertension, Pt. on home beta blockers and Pt. on medications + CAD and + Past MI  Normal cardiovascular exam Rhythm:Regular Rate:Normal  Cath 2019 - Prox RCA lesion is 80% stenosed. Mid RCA to Dist RCA lesion is 70% stenosed. Dist RCA lesion is 70% stenosed. Prox LAD to Mid LAD lesion is 90% stenosed. Mid LAD to Dist LAD lesion is 85% stenosed. Ost 1st Diag lesion is 80% stenosed. Lat 1st Diag lesion is 90% stenosed. 1st Diag lesion is 90% stenosed. Ost 1st Mrg to 1st Mrg lesion is 90% stenosed. There is moderate left ventricular systolic dysfunction. LV end diastolic pressure is mildly elevated. The left ventricular ejection fraction is 35-45% by visual estimate  TTE 2019 - Septal apical and mid/apical inferior wall hypokinesis. The cavity size was moderately dilated. Moderate LVH. EF was 40%. No regional wall motion abnormalities. Mild MR. LA  was mildly dilated. Normal RV size and function. Mild PR and TR.   Carotid US 2019 - R ICA 1-39% stenosis, L ICA 40-59% stenosis.  EKG - Inferior + Anteroseptal Q waves, anteroseptal/lateral and inferior TWI    Neuro/Psych Anxiety Depression negative neurological ROS     GI/Hepatic Neg liver ROS, PUD,   Endo/Other  diabetes, Type 2  Renal/GU negative Renal ROS   Bladder prolapse    Musculoskeletal  (+) Arthritis ,   Abdominal   Peds  Hematology  (+) anemia ,   Anesthesia Other Findings    Reproductive/Obstetrics                             Anesthesia Physical Anesthesia Plan  ASA: IV  Anesthesia Plan: General   Post-op Pain Management:    Induction: Intravenous  PONV Risk Score and Plan: 4 or greater and Treatment may vary due to age or medical condition and Midazolam  Airway Management Planned: Oral ETT  Additional Equipment: Arterial line, CVP, PA Cath, TEE and Ultrasound Guidance Line Placement  Intra-op Plan:   Post-operative Plan: Post-operative intubation/ventilation  Informed Consent: I have reviewed the patients History and Physical, chart, labs and discussed the procedure including the risks, benefits and alternatives for the proposed anesthesia with the patient or authorized representative who has indicated his/her understanding and acceptance.   Dental advisory given  Plan Discussed with: CRNA  Anesthesia Plan Comments:        Anesthesia Quick Evaluation

## 2017-10-18 NOTE — Progress Notes (Signed)
   Subjective: Patient is doing well this AM. She is ambulating and tolerating PO intake without any issues. She is waiting on surgery tomorrow and does have some questions regarding the procedure. She has been unable to watch the educational videos due to the television not working properly. All questions and concerns addressed.   Objective: Vital signs in last 24 hours: Vitals:   10/17/17 1557 10/17/17 1944 10/18/17 0013 10/18/17 0418  BP: (!) 144/62 (!) 109/59 (!) 118/58 117/61  Pulse: (!) 55 81 77 68  Resp: 16 18 18 18   Temp: 98 F (36.7 C) (!) 97.5 F (36.4 C) 98.2 F (36.8 C) 98 F (36.7 C)  TempSrc: Oral Oral Oral Oral  SpO2: 100% 99% 96% 96%  Weight:    149 lb 1.6 oz (67.6 kg)  Height:       General: Well nourished female in no acute distress Pulm: Good air movement with no wheezing or crackles  CV: RRR, no murmurs, no rubs  Abdomen: Active bowel sounds, soft, non-distended, no tenderness to palpation   Assessment/Plan:  Victoria Eaton is a 57 year old female with CAD s/p drug-eluting stent to RCA in 2006, hypertension, type 2 diabetes mellitus, uterine prolapse, PUD, and anxiety/depression who presented 1/19 with epigastric abdominal pain and elevated troponins.Patient was subsequently found to have pancreatitisand NSTEMI. She was taken for cath that illustrated multivessel disease and need for CABG which is tentatively planned for 1/28. Her abdominal pain has resolved and she is tolerating PO intake.Further management as outlined below.  NSTEMI CAD status post drug-eluting stent to the RCA in 2016 - Left heart cath showing diffuse 3 vessel disease. She is currently without CP or SOB.  - Scheduled for CABG on 1/28 at 7:30 AM - Cardiologyon, recommending to continue ASA, statin, and beta-blocker  GallstonePancreatitis - Presented with epigastric pain, lipase 187, and findings on CT abdomen consistent with pancreatitis. Abdominal pain has resolved and she is  tolerating PO.  - She will need Cholecystectomy in future following CABG.   Type 2 diabetes mellitus - SSI-resistant  Hypertension - BP well controlled with Lisinopril 40 mg daily, metoprolol 25 mg twice daily, and amlodipine 10 mg daily  Dispo: Anticipated discharge in approximately >1 day(s).   Ina Homes, MD 10/18/2017, 5:47 AM My Pager: [phone]

## 2017-10-19 ENCOUNTER — Inpatient Hospital Stay (HOSPITAL_COMMUNITY): Payer: BLUE CROSS/BLUE SHIELD

## 2017-10-19 ENCOUNTER — Encounter (HOSPITAL_COMMUNITY)
Admission: EM | Disposition: A | Discharge status: Home / Self Care | Payer: Self-pay | Source: Home / Self Care | Attending: Student in an Organized Health Care Education/Training Program

## 2017-10-19 ENCOUNTER — Inpatient Hospital Stay (HOSPITAL_COMMUNITY): Payer: BLUE CROSS/BLUE SHIELD | Admitting: Anesthesiology

## 2017-10-19 ENCOUNTER — Encounter (HOSPITAL_COMMUNITY): Payer: Self-pay | Admitting: Critical Care Medicine

## 2017-10-19 DIAGNOSIS — Z951 Presence of aortocoronary bypass graft: Secondary | ICD-10-CM

## 2017-10-19 HISTORY — PX: CORONARY ARTERY BYPASS GRAFT: SHX141

## 2017-10-19 HISTORY — PX: TEE WITHOUT CARDIOVERSION: SHX5443

## 2017-10-19 LAB — CBC
HCT: 18.2 % — ABNORMAL LOW (ref 36.0–46.0)
HCT: 27.5 % — ABNORMAL LOW (ref 36.0–46.0)
HCT: 26.4 % — ABNORMAL LOW (ref 36.0–46.0)
Hemoglobin: 5.8 g/dL — CL (ref 12.0–15.0)
Hemoglobin: 8.9 g/dL — ABNORMAL LOW (ref 12.0–15.0)
Hemoglobin: 8.4 g/dL — ABNORMAL LOW (ref 12.0–15.0)
MCH: 26.6 pg (ref 26.0–34.0)
MCH: 26.8 pg (ref 26.0–34.0)
MCH: 26.4 pg (ref 26.0–34.0)
MCHC: 31.9 g/dL (ref 30.0–36.0)
MCHC: 32.4 g/dL (ref 30.0–36.0)
MCHC: 31.8 g/dL (ref 30.0–36.0)
MCV: 83.5 fL (ref 78.0–100.0)
MCV: 82.8 fL (ref 78.0–100.0)
MCV: 83 fL (ref 78.0–100.0)
PLATELETS: 108 10*3/uL — AB (ref 150–400)
Platelets: 166 10*3/uL (ref 150–400)
Platelets: 174 10*3/uL (ref 150–400)
RBC: 2.18 MIL/uL — ABNORMAL LOW (ref 3.87–5.11)
RBC: 3.32 MIL/uL — ABNORMAL LOW (ref 3.87–5.11)
RBC: 3.18 MIL/uL — ABNORMAL LOW (ref 3.87–5.11)
RDW: 18.7 % — AB (ref 11.5–15.5)
RDW: 18.4 % — ABNORMAL HIGH (ref 11.5–15.5)
RDW: 18.3 % — ABNORMAL HIGH (ref 11.5–15.5)
WBC: 5.2 10*3/uL (ref 4.0–10.5)
WBC: 8.3 10*3/uL (ref 4.0–10.5)
WBC: 8.7 10*3/uL (ref 4.0–10.5)

## 2017-10-19 LAB — POCT I-STAT, CHEM 8
BUN: 19 mg/dL (ref 6–20)
BUN: 19 mg/dL (ref 6–20)
BUN: 18 mg/dL (ref 6–20)
BUN: 18 mg/dL (ref 6–20)
BUN: 16 mg/dL (ref 6–20)
BUN: 16 mg/dL (ref 6–20)
CALCIUM ION: 1.27 mmol/L (ref 1.15–1.40)
CALCIUM ION: 1.16 mmol/L (ref 1.15–1.40)
CALCIUM ION: 1.12 mmol/L — AB (ref 1.15–1.40)
CHLORIDE: 101 mmol/L (ref 101–111)
CHLORIDE: 102 mmol/L (ref 101–111)
CREATININE: 0.7 mg/dL (ref 0.44–1.00)
CREATININE: 0.7 mg/dL (ref 0.44–1.00)
CREATININE: 0.7 mg/dL (ref 0.44–1.00)
Calcium, Ion: 1.25 mmol/L (ref 1.15–1.40)
Calcium, Ion: 1.04 mmol/L — ABNORMAL LOW (ref 1.15–1.40)
Calcium, Ion: 1.16 mmol/L (ref 1.15–1.40)
Chloride: 99 mmol/L — ABNORMAL LOW (ref 101–111)
Chloride: 100 mmol/L — ABNORMAL LOW (ref 101–111)
Chloride: 102 mmol/L (ref 101–111)
Chloride: 105 mmol/L (ref 101–111)
Creatinine, Ser: 0.7 mg/dL (ref 0.44–1.00)
Creatinine, Ser: 0.7 mg/dL (ref 0.44–1.00)
Creatinine, Ser: 0.7 mg/dL (ref 0.44–1.00)
GLUCOSE: 162 mg/dL — AB (ref 65–99)
GLUCOSE: 163 mg/dL — AB (ref 65–99)
Glucose, Bld: 244 mg/dL — ABNORMAL HIGH (ref 65–99)
Glucose, Bld: 190 mg/dL — ABNORMAL HIGH (ref 65–99)
Glucose, Bld: 141 mg/dL — ABNORMAL HIGH (ref 65–99)
Glucose, Bld: 109 mg/dL — ABNORMAL HIGH (ref 65–99)
HCT: 31 % — ABNORMAL LOW (ref 36.0–46.0)
HCT: 25 % — ABNORMAL LOW (ref 36.0–46.0)
HCT: 26 % — ABNORMAL LOW (ref 36.0–46.0)
HCT: 26 % — ABNORMAL LOW (ref 36.0–46.0)
HCT: 26 % — ABNORMAL LOW (ref 36.0–46.0)
HEMATOCRIT: 29 % — AB (ref 36.0–46.0)
HEMOGLOBIN: 8.5 g/dL — AB (ref 12.0–15.0)
HEMOGLOBIN: 8.8 g/dL — AB (ref 12.0–15.0)
HEMOGLOBIN: 8.8 g/dL — AB (ref 12.0–15.0)
Hemoglobin: 10.5 g/dL — ABNORMAL LOW (ref 12.0–15.0)
Hemoglobin: 9.9 g/dL — ABNORMAL LOW (ref 12.0–15.0)
Hemoglobin: 8.8 g/dL — ABNORMAL LOW (ref 12.0–15.0)
POTASSIUM: 4.3 mmol/L (ref 3.5–5.1)
POTASSIUM: 3.7 mmol/L (ref 3.5–5.1)
POTASSIUM: 3.5 mmol/L (ref 3.5–5.1)
Potassium: 4 mmol/L (ref 3.5–5.1)
Potassium: 3.4 mmol/L — ABNORMAL LOW (ref 3.5–5.1)
Potassium: 4.4 mmol/L (ref 3.5–5.1)
SODIUM: 137 mmol/L (ref 135–145)
SODIUM: 140 mmol/L (ref 135–145)
Sodium: 137 mmol/L (ref 135–145)
Sodium: 139 mmol/L (ref 135–145)
Sodium: 139 mmol/L (ref 135–145)
Sodium: 139 mmol/L (ref 135–145)
TCO2: 25 mmol/L (ref 22–32)
TCO2: 26 mmol/L (ref 22–32)
TCO2: 26 mmol/L (ref 22–32)
TCO2: 24 mmol/L (ref 22–32)
TCO2: 23 mmol/L (ref 22–32)
TCO2: 23 mmol/L (ref 22–32)

## 2017-10-19 LAB — POCT I-STAT 3, ART BLOOD GAS (G3+)
Acid-base deficit: 2 mmol/L (ref 0.0–2.0)
Acid-base deficit: 1 mmol/L (ref 0.0–2.0)
Acid-base deficit: 2 mmol/L (ref 0.0–2.0)
Acid-base deficit: 3 mmol/L — ABNORMAL HIGH (ref 0.0–2.0)
BICARBONATE: 24.7 mmol/L (ref 20.0–28.0)
Bicarbonate: 22.8 mmol/L (ref 20.0–28.0)
Bicarbonate: 23.5 mmol/L (ref 20.0–28.0)
Bicarbonate: 23.5 mmol/L (ref 20.0–28.0)
Bicarbonate: 23.3 mmol/L (ref 20.0–28.0)
O2 SAT: 100 %
O2 Saturation: 100 %
O2 Saturation: 99 %
O2 Saturation: 99 %
O2 Saturation: 98 %
PCO2 ART: 36.9 mmHg (ref 32.0–48.0)
PH ART: 7.398 (ref 7.350–7.450)
PO2 ART: 368 mmHg — AB (ref 83.0–108.0)
Patient temperature: 35.7
Patient temperature: 36.3
Patient temperature: 36.4
TCO2: 26 mmol/L (ref 22–32)
TCO2: 24 mmol/L (ref 22–32)
TCO2: 25 mmol/L (ref 22–32)
TCO2: 25 mmol/L (ref 22–32)
TCO2: 25 mmol/L (ref 22–32)
pCO2 arterial: 37.9 mmHg (ref 32.0–48.0)
pCO2 arterial: 34.8 mmHg (ref 32.0–48.0)
pCO2 arterial: 43.5 mmHg (ref 32.0–48.0)
pCO2 arterial: 46.1 mmHg (ref 32.0–48.0)
pH, Arterial: 7.422 (ref 7.350–7.450)
pH, Arterial: 7.432 (ref 7.350–7.450)
pH, Arterial: 7.336 — ABNORMAL LOW (ref 7.350–7.450)
pH, Arterial: 7.308 — ABNORMAL LOW (ref 7.350–7.450)
pO2, Arterial: 248 mmHg — ABNORMAL HIGH (ref 83.0–108.0)
pO2, Arterial: 141 mmHg — ABNORMAL HIGH (ref 83.0–108.0)
pO2, Arterial: 156 mmHg — ABNORMAL HIGH (ref 83.0–108.0)
pO2, Arterial: 105 mmHg (ref 83.0–108.0)

## 2017-10-19 LAB — GLUCOSE, CAPILLARY
Glucose-Capillary: 220 mg/dL — ABNORMAL HIGH (ref 65–99)
Glucose-Capillary: 99 mg/dL (ref 65–99)
Glucose-Capillary: 102 mg/dL — ABNORMAL HIGH (ref 65–99)
Glucose-Capillary: 86 mg/dL (ref 65–99)
Glucose-Capillary: 96 mg/dL (ref 65–99)
Glucose-Capillary: 108 mg/dL — ABNORMAL HIGH (ref 65–99)
Glucose-Capillary: 116 mg/dL — ABNORMAL HIGH (ref 65–99)
Glucose-Capillary: 136 mg/dL — ABNORMAL HIGH (ref 65–99)
Glucose-Capillary: 91 mg/dL (ref 65–99)
Glucose-Capillary: 131 mg/dL — ABNORMAL HIGH (ref 65–99)

## 2017-10-19 LAB — CREATININE, SERUM
Creatinine, Ser: 0.78 mg/dL (ref 0.44–1.00)
GFR calc Af Amer: >60 mL/min (ref >60)
GFR calc non Af Amer: >60 mL/min (ref >60)

## 2017-10-19 LAB — APTT: APTT: 40 s — AB (ref 24–36)

## 2017-10-19 LAB — HEMOGLOBIN AND HEMATOCRIT, BLOOD
HCT: 24.5 % — ABNORMAL LOW (ref 36.0–46.0)
Hemoglobin: 7.8 g/dL — ABNORMAL LOW (ref 12.0–15.0)

## 2017-10-19 LAB — PLATELET COUNT: Platelets: 233 10*3/uL (ref 150–400)

## 2017-10-19 LAB — PROTIME-INR
INR: 1.36
INR: 1.24
Prothrombin Time: 16.7 seconds — ABNORMAL HIGH (ref 11.4–15.2)
Prothrombin Time: 15.5 seconds — ABNORMAL HIGH (ref 11.4–15.2)

## 2017-10-19 LAB — POCT I-STAT 4, (NA,K, GLUC, HGB,HCT)
Glucose, Bld: 232 mg/dL — ABNORMAL HIGH (ref 65–99)
Glucose, Bld: 103 mg/dL — ABNORMAL HIGH (ref 65–99)
HCT: 29 % — ABNORMAL LOW (ref 36.0–46.0)
HCT: 25 % — ABNORMAL LOW (ref 36.0–46.0)
Hemoglobin: 9.9 g/dL — ABNORMAL LOW (ref 12.0–15.0)
Hemoglobin: 8.5 g/dL — ABNORMAL LOW (ref 12.0–15.0)
Potassium: 4.2 mmol/L (ref 3.5–5.1)
Potassium: 3.4 mmol/L — ABNORMAL LOW (ref 3.5–5.1)
Sodium: 136 mmol/L (ref 135–145)
Sodium: 141 mmol/L (ref 135–145)

## 2017-10-19 LAB — MAGNESIUM: Magnesium: 3 mg/dL — ABNORMAL HIGH (ref 1.7–2.4)

## 2017-10-19 LAB — PREPARE RBC (CROSSMATCH)

## 2017-10-19 SURGERY — CORONARY ARTERY BYPASS GRAFTING (CABG)
Anesthesia: General | Site: Chest

## 2017-10-19 MED ORDER — VANCOMYCIN HCL IN DEXTROSE 1-5 GM/200ML-% IV SOLN
1000.0000 mg | Freq: Once | INTRAVENOUS | Status: AC
  Administered 2017-10-19: 1000 mg via INTRAVENOUS
  Filled 2017-10-19: qty 200

## 2017-10-19 MED ORDER — ALBUMIN HUMAN 5 % IV SOLN
250.0000 mL | INTRAVENOUS | Status: AC | PRN
  Administered 2017-10-19 (×3): 250 mL via INTRAVENOUS
  Filled 2017-10-19 (×2): qty 250

## 2017-10-19 MED ORDER — LACTATED RINGERS IV SOLN: INTRAVENOUS | Status: DC

## 2017-10-19 MED ORDER — ASPIRIN 81 MG PO CHEW
324.0000 mg | CHEWABLE_TABLET | Freq: Every day | ORAL | Status: DC
  Filled 2017-10-19: qty 4

## 2017-10-19 MED ORDER — ORAL CARE MOUTH RINSE
15.0000 mL | Freq: Four times a day (QID) | OROMUCOSAL | Status: DC
  Administered 2017-10-20: 15 mL via OROMUCOSAL

## 2017-10-19 MED ORDER — FENTANYL CITRATE (PF) 250 MCG/5ML IJ SOLN
INTRAMUSCULAR | Status: DC | PRN
  Administered 2017-10-19: 150 ug via INTRAVENOUS
  Administered 2017-10-19: 250 ug via INTRAVENOUS
  Administered 2017-10-19: 50 ug via INTRAVENOUS
  Administered 2017-10-19 (×2): 25 ug via INTRAVENOUS
  Administered 2017-10-19: 500 ug via INTRAVENOUS
  Administered 2017-10-19 (×3): 50 ug via INTRAVENOUS
  Administered 2017-10-19: 100 ug via INTRAVENOUS
  Administered 2017-10-19: 50 ug via INTRAVENOUS
  Administered 2017-10-19: 150 ug via INTRAVENOUS

## 2017-10-19 MED ORDER — GELATIN ABSORBABLE MT POWD
OROMUCOSAL | Status: DC | PRN
  Administered 2017-10-19 (×3): 4 mL via TOPICAL

## 2017-10-19 MED ORDER — LACTATED RINGERS IV SOLN
INTRAVENOUS | Status: DC | PRN
  Administered 2017-10-19 (×2): via INTRAVENOUS

## 2017-10-19 MED ORDER — SODIUM CHLORIDE 0.9 % IV SOLN
INTRAVENOUS | Status: DC | PRN
  Administered 2017-10-19: 13:00:00 via INTRAVENOUS

## 2017-10-19 MED ORDER — SODIUM CHLORIDE 0.9 % IV SOLN
INTRAVENOUS | Status: DC
  Administered 2017-10-20: 1 [IU]/h via INTRAVENOUS
  Filled 2017-10-19: qty 1

## 2017-10-19 MED ORDER — MORPHINE SULFATE (PF) 4 MG/ML IV SOLN
1.0000 mg | INTRAVENOUS | Status: AC | PRN
  Administered 2017-10-19: 2 mg via INTRAVENOUS
  Filled 2017-10-19: qty 1

## 2017-10-19 MED ORDER — MORPHINE SULFATE (PF) 4 MG/ML IV SOLN
2.0000 mg | INTRAVENOUS | Status: DC | PRN
  Administered 2017-10-19: 2 mg via INTRAVENOUS
  Administered 2017-10-20: 4 mg via INTRAVENOUS
  Filled 2017-10-19 (×2): qty 1

## 2017-10-19 MED ORDER — ONDANSETRON HCL 4 MG/2ML IJ SOLN
4.0000 mg | Freq: Four times a day (QID) | INTRAMUSCULAR | Status: DC | PRN
  Administered 2017-10-20: 4 mg via INTRAVENOUS
  Filled 2017-10-19: qty 2

## 2017-10-19 MED ORDER — METOCLOPRAMIDE HCL 5 MG/ML IJ SOLN
10.0000 mg | Freq: Four times a day (QID) | INTRAMUSCULAR | Status: AC
  Administered 2017-10-19 – 2017-10-20 (×3): 10 mg via INTRAVENOUS
  Filled 2017-10-19 (×2): qty 2

## 2017-10-19 MED ORDER — ROCURONIUM BROMIDE 10 MG/ML (PF) SYRINGE
PREFILLED_SYRINGE | INTRAVENOUS | Status: AC
  Filled 2017-10-19: qty 5

## 2017-10-19 MED ORDER — DOCUSATE SODIUM 100 MG PO CAPS
200.0000 mg | ORAL_CAPSULE | Freq: Every day | ORAL | Status: DC
  Administered 2017-10-20 – 2017-10-21 (×2): 200 mg via ORAL
  Filled 2017-10-19 (×3): qty 2

## 2017-10-19 MED ORDER — 0.9 % SODIUM CHLORIDE (POUR BTL) OPTIME
TOPICAL | Status: DC | PRN
  Administered 2017-10-19: 5000 mL

## 2017-10-19 MED ORDER — PHENYLEPHRINE 40 MCG/ML (10ML) SYRINGE FOR IV PUSH (FOR BLOOD PRESSURE SUPPORT)
PREFILLED_SYRINGE | INTRAVENOUS | Status: DC | PRN
  Administered 2017-10-19: 40 ug via INTRAVENOUS
  Administered 2017-10-19 (×2): 20 ug via INTRAVENOUS

## 2017-10-19 MED ORDER — MIDAZOLAM HCL 5 MG/5ML IJ SOLN
INTRAMUSCULAR | Status: DC | PRN
  Administered 2017-10-19 (×5): 1 mg via INTRAVENOUS
  Administered 2017-10-19: 2 mg via INTRAVENOUS
  Administered 2017-10-19: 1 mg via INTRAVENOUS

## 2017-10-19 MED ORDER — ACETAMINOPHEN 160 MG/5ML PO SOLN: 1000.0000 mg | Freq: Four times a day (QID) | ORAL | Status: DC

## 2017-10-19 MED ORDER — ARTIFICIAL TEARS OPHTHALMIC OINT
TOPICAL_OINTMENT | OPHTHALMIC | Status: AC
  Filled 2017-10-19: qty 7

## 2017-10-19 MED ORDER — ROCURONIUM BROMIDE 10 MG/ML (PF) SYRINGE
PREFILLED_SYRINGE | INTRAVENOUS | Status: AC
Start: 2017-10-19 — End: ?
  Filled 2017-10-19: qty 5

## 2017-10-19 MED ORDER — PANTOPRAZOLE SODIUM 40 MG PO TBEC
40.0000 mg | DELAYED_RELEASE_TABLET | Freq: Every day | ORAL | Status: DC
  Administered 2017-10-21: 40 mg via ORAL
  Filled 2017-10-19 (×2): qty 1

## 2017-10-19 MED ORDER — MAGNESIUM SULFATE 4 GM/100ML IV SOLN
INTRAVENOUS | Status: AC
  Administered 2017-10-19: 4 g
  Filled 2017-10-19: qty 100

## 2017-10-19 MED ORDER — SODIUM CHLORIDE 0.9% FLUSH: 3.0000 mL | INTRAVENOUS | Status: DC | PRN

## 2017-10-19 MED ORDER — SODIUM CHLORIDE 0.45 % IV SOLN
INTRAVENOUS | Status: DC | PRN
  Administered 2017-10-19: 10 mL/h via INTRAVENOUS

## 2017-10-19 MED ORDER — HEMOSTATIC AGENTS (NO CHARGE) OPTIME
TOPICAL | Status: DC | PRN
  Administered 2017-10-19: 1 via TOPICAL

## 2017-10-19 MED ORDER — NITROGLYCERIN IN D5W 200-5 MCG/ML-% IV SOLN: 0.0000 ug/min | INTRAVENOUS | Status: DC

## 2017-10-19 MED ORDER — SODIUM CHLORIDE 0.9 % IV SOLN: 0.0000 ug/kg/h | INTRAVENOUS | Status: DC

## 2017-10-19 MED ORDER — LACTATED RINGERS IV SOLN: 500.0000 mL | Freq: Once | INTRAVENOUS | Status: DC | PRN

## 2017-10-19 MED ORDER — CHLORHEXIDINE GLUCONATE 0.12% ORAL RINSE (MEDLINE KIT)
15.0000 mL | Freq: Two times a day (BID) | OROMUCOSAL | Status: DC
  Administered 2017-10-19 – 2017-10-20 (×2): 15 mL via OROMUCOSAL

## 2017-10-19 MED ORDER — LIDOCAINE 2% (20 MG/ML) 5 ML SYRINGE
INTRAMUSCULAR | Status: AC
  Filled 2017-10-19: qty 5

## 2017-10-19 MED ORDER — SODIUM CHLORIDE 0.9 % IJ SOLN
INTRAMUSCULAR | Status: AC
  Filled 2017-10-19: qty 10

## 2017-10-19 MED ORDER — SODIUM CHLORIDE 0.9 % IV SOLN
250.0000 mL | INTRAVENOUS | Status: DC
  Administered 2017-10-20: 250 mL via INTRAVENOUS

## 2017-10-19 MED ORDER — PLASMA-LYTE 148 IV SOLN
INTRAVENOUS | Status: DC | PRN
  Administered 2017-10-19: 500 mL via INTRAVASCULAR

## 2017-10-19 MED ORDER — HEPARIN SODIUM (PORCINE) 1000 UNIT/ML IJ SOLN
INTRAMUSCULAR | Status: DC | PRN
  Administered 2017-10-19: 22000 [IU] via INTRAVENOUS

## 2017-10-19 MED ORDER — PROPOFOL 10 MG/ML IV BOLUS
INTRAVENOUS | Status: DC | PRN
  Administered 2017-10-19 (×3): 50 mg via INTRAVENOUS

## 2017-10-19 MED ORDER — FENTANYL CITRATE (PF) 250 MCG/5ML IJ SOLN
INTRAMUSCULAR | Status: AC
Start: 2017-10-19 — End: ?
  Filled 2017-10-19: qty 5

## 2017-10-19 MED ORDER — PROPOFOL 10 MG/ML IV BOLUS
INTRAVENOUS | Status: AC
  Filled 2017-10-19: qty 20

## 2017-10-19 MED ORDER — ALBUMIN HUMAN 5 % IV SOLN
INTRAVENOUS | Status: DC | PRN
  Administered 2017-10-19: 13:00:00 via INTRAVENOUS

## 2017-10-19 MED ORDER — LACTATED RINGERS IV SOLN
INTRAVENOUS | Status: DC | PRN
  Administered 2017-10-19: 06:00:00 via INTRAVENOUS

## 2017-10-19 MED ORDER — SODIUM CHLORIDE 0.9% FLUSH
3.0000 mL | Freq: Two times a day (BID) | INTRAVENOUS | Status: DC
  Administered 2017-10-20 – 2017-10-21 (×4): 3 mL via INTRAVENOUS

## 2017-10-19 MED ORDER — PROTAMINE SULFATE 10 MG/ML IV SOLN
INTRAVENOUS | Status: DC | PRN
  Administered 2017-10-19: 250 mg via INTRAVENOUS

## 2017-10-19 MED ORDER — SODIUM CHLORIDE 0.9 % IV SOLN
0.0000 ug/min | INTRAVENOUS | Status: DC
  Administered 2017-10-20: 10 ug/min via INTRAVENOUS
  Administered 2017-10-20: 55 ug/min via INTRAVENOUS
  Filled 2017-10-19: qty 20
  Filled 2017-10-19: qty 2

## 2017-10-19 MED ORDER — PHENYLEPHRINE 40 MCG/ML (10ML) SYRINGE FOR IV PUSH (FOR BLOOD PRESSURE SUPPORT)
PREFILLED_SYRINGE | INTRAVENOUS | Status: AC
  Filled 2017-10-19: qty 10

## 2017-10-19 MED ORDER — INSULIN REGULAR BOLUS VIA INFUSION
0.0000 [IU] | Freq: Three times a day (TID) | INTRAVENOUS | Status: DC
  Filled 2017-10-19: qty 10

## 2017-10-19 MED ORDER — LEVALBUTEROL HCL 0.63 MG/3ML IN NEBU
0.6300 mg | INHALATION_SOLUTION | Freq: Four times a day (QID) | RESPIRATORY_TRACT | Status: AC
  Administered 2017-10-19 – 2017-10-20 (×4): 0.63 mg via RESPIRATORY_TRACT
  Filled 2017-10-19 (×4): qty 3

## 2017-10-19 MED ORDER — ACETAMINOPHEN 160 MG/5ML PO SOLN: 650.0000 mg | Freq: Once | ORAL | Status: AC

## 2017-10-19 MED ORDER — SODIUM CHLORIDE 0.9 % IV SOLN
INTRAVENOUS | Status: DC
  Administered 2017-10-19: 10 mL/h via INTRAVENOUS

## 2017-10-19 MED ORDER — LACTATED RINGERS IV SOLN
INTRAVENOUS | Status: DC
  Administered 2017-10-19: 22:00:00 via INTRAVENOUS

## 2017-10-19 MED ORDER — MIDAZOLAM HCL 10 MG/2ML IJ SOLN
INTRAMUSCULAR | Status: AC
  Filled 2017-10-19: qty 2

## 2017-10-19 MED ORDER — MIDAZOLAM HCL 2 MG/2ML IJ SOLN: 2.0000 mg | INTRAMUSCULAR | Status: DC | PRN

## 2017-10-19 MED ORDER — OXYCODONE HCL 5 MG PO TABS
5.0000 mg | ORAL_TABLET | ORAL | Status: DC | PRN
  Administered 2017-10-20 (×3): 10 mg via ORAL
  Administered 2017-10-20: 5 mg via ORAL
  Administered 2017-10-20 – 2017-10-21 (×3): 10 mg via ORAL
  Administered 2017-10-21: 5 mg via ORAL
  Administered 2017-10-21: 10 mg via ORAL
  Filled 2017-10-19 (×6): qty 2
  Filled 2017-10-19 (×2): qty 1
  Filled 2017-10-19: qty 2

## 2017-10-19 MED ORDER — POTASSIUM CHLORIDE 10 MEQ/50ML IV SOLN
10.0000 meq | INTRAVENOUS | Status: AC
  Administered 2017-10-19 (×3): 10 meq via INTRAVENOUS

## 2017-10-19 MED ORDER — PROTAMINE SULFATE 10 MG/ML IV SOLN
INTRAVENOUS | Status: AC
Start: 2017-10-19 — End: ?
  Filled 2017-10-19: qty 25

## 2017-10-19 MED ORDER — BISACODYL 10 MG RE SUPP: 10.0000 mg | Freq: Every day | RECTAL | Status: DC

## 2017-10-19 MED ORDER — LACTATED RINGERS IV SOLN
INTRAVENOUS | Status: DC | PRN
  Administered 2017-10-19: 07:00:00 via INTRAVENOUS

## 2017-10-19 MED ORDER — MAGNESIUM SULFATE 4 GM/100ML IV SOLN: 4.0000 g | Freq: Once | INTRAVENOUS | Status: AC

## 2017-10-19 MED ORDER — EPINEPHRINE PF 1 MG/10ML IJ SOSY
PREFILLED_SYRINGE | INTRAMUSCULAR | Status: AC
  Filled 2017-10-19: qty 10

## 2017-10-19 MED ORDER — TRAMADOL HCL 50 MG PO TABS
50.0000 mg | ORAL_TABLET | ORAL | Status: DC | PRN
  Administered 2017-10-21: 100 mg via ORAL
  Filled 2017-10-19: qty 2

## 2017-10-19 MED ORDER — ACETAMINOPHEN 500 MG PO TABS
1000.0000 mg | ORAL_TABLET | Freq: Four times a day (QID) | ORAL | Status: DC
  Administered 2017-10-19 – 2017-10-22 (×10): 1000 mg via ORAL
  Filled 2017-10-19 (×10): qty 2

## 2017-10-19 MED ORDER — FENTANYL CITRATE (PF) 250 MCG/5ML IJ SOLN
INTRAMUSCULAR | Status: AC
Start: 2017-10-19 — End: ?
  Filled 2017-10-19: qty 20

## 2017-10-19 MED ORDER — PHENYLEPHRINE HCL 10 MG/ML IJ SOLN
INTRAMUSCULAR | Status: AC
  Filled 2017-10-19: qty 1

## 2017-10-19 MED ORDER — METOPROLOL TARTRATE 5 MG/5ML IV SOLN: 2.5000 mg | INTRAVENOUS | Status: DC | PRN

## 2017-10-19 MED ORDER — ASPIRIN EC 325 MG PO TBEC
325.0000 mg | DELAYED_RELEASE_TABLET | Freq: Every day | ORAL | Status: DC
  Administered 2017-10-20 – 2017-10-21 (×2): 325 mg via ORAL
  Filled 2017-10-19 (×3): qty 1

## 2017-10-19 MED ORDER — METOPROLOL TARTRATE 25 MG/10 ML ORAL SUSPENSION: 12.5000 mg | Freq: Two times a day (BID) | ORAL | Status: DC

## 2017-10-19 MED ORDER — METOPROLOL TARTRATE 12.5 MG HALF TABLET
12.5000 mg | ORAL_TABLET | Freq: Two times a day (BID) | ORAL | Status: DC
  Administered 2017-10-20 – 2017-10-21 (×3): 12.5 mg via ORAL
  Filled 2017-10-19 (×4): qty 1

## 2017-10-19 MED ORDER — ACETAMINOPHEN 650 MG RE SUPP
650.0000 mg | Freq: Once | RECTAL | Status: AC
  Administered 2017-10-19: 650 mg via RECTAL

## 2017-10-19 MED ORDER — BISACODYL 5 MG PO TBEC
10.0000 mg | DELAYED_RELEASE_TABLET | Freq: Every day | ORAL | Status: DC
  Administered 2017-10-20 – 2017-10-21 (×2): 10 mg via ORAL
  Filled 2017-10-19 (×2): qty 2

## 2017-10-19 MED ORDER — MILRINONE LACTATE IN DEXTROSE 20-5 MG/100ML-% IV SOLN
0.1250 ug/kg/min | INTRAVENOUS | Status: AC
  Filled 2017-10-19: qty 100

## 2017-10-19 MED ORDER — HEPARIN SODIUM (PORCINE) 1000 UNIT/ML IJ SOLN
INTRAMUSCULAR | Status: AC
  Filled 2017-10-19: qty 1

## 2017-10-19 MED ORDER — LEVOFLOXACIN IN D5W 750 MG/150ML IV SOLN
750.0000 mg | INTRAVENOUS | Status: AC
  Administered 2017-10-20: 750 mg via INTRAVENOUS
  Filled 2017-10-19: qty 150

## 2017-10-19 MED ORDER — ARTIFICIAL TEARS OPHTHALMIC OINT
TOPICAL_OINTMENT | OPHTHALMIC | Status: DC | PRN
  Administered 2017-10-19: 1 via OPHTHALMIC

## 2017-10-19 MED ORDER — ROCURONIUM BROMIDE 10 MG/ML (PF) SYRINGE
PREFILLED_SYRINGE | INTRAVENOUS | Status: DC | PRN
  Administered 2017-10-19: 30 mg via INTRAVENOUS
  Administered 2017-10-19: 40 mg via INTRAVENOUS
  Administered 2017-10-19: 20 mg via INTRAVENOUS
  Administered 2017-10-19: 40 mg via INTRAVENOUS
  Administered 2017-10-19: 70 mg via INTRAVENOUS

## 2017-10-19 MED ORDER — CHLORHEXIDINE GLUCONATE 0.12 % MT SOLN
15.0000 mL | OROMUCOSAL | Status: AC
  Administered 2017-10-19: 15 mL via OROMUCOSAL

## 2017-10-19 MED ORDER — FAMOTIDINE IN NACL 20-0.9 MG/50ML-% IV SOLN
20.0000 mg | Freq: Two times a day (BID) | INTRAVENOUS | Status: AC
  Administered 2017-10-19 (×2): 20 mg via INTRAVENOUS
  Filled 2017-10-19: qty 50

## 2017-10-19 SURGICAL SUPPLY — 76 items
ADH SKN CLS APL DERMABOND .7 (GAUZE/BANDAGES/DRESSINGS) ×2
BAG DECANTER FOR FLEXI CONT (MISCELLANEOUS) ×3 IMPLANT
BANDAGE ACE 4X5 VEL STRL LF (GAUZE/BANDAGES/DRESSINGS) ×3 IMPLANT
BANDAGE ACE 6X5 VEL STRL LF (GAUZE/BANDAGES/DRESSINGS) ×3 IMPLANT
BLADE STERNUM SYSTEM 6 (BLADE) ×3 IMPLANT
BLADE SURG 11 STRL SS (BLADE) ×1 IMPLANT
BNDG GAUZE ELAST 4 BULKY (GAUZE/BANDAGES/DRESSINGS) ×3 IMPLANT
CANISTER SUCT 3000ML PPV (MISCELLANEOUS) ×3 IMPLANT
CATH CPB KIT GERHARDT (MISCELLANEOUS) ×3 IMPLANT
CATH THORACIC 28FR (CATHETERS) ×3 IMPLANT
CRADLE DONUT ADULT HEAD (MISCELLANEOUS) ×3 IMPLANT
DERMABOND ADVANCED (GAUZE/BANDAGES/DRESSINGS) ×1
DERMABOND ADVANCED .7 DNX12 (GAUZE/BANDAGES/DRESSINGS) IMPLANT
DRAIN CHANNEL 28F RND 3/8 FF (WOUND CARE) ×3 IMPLANT
DRAPE CARDIOVASCULAR INCISE (DRAPES) ×3
DRAPE SLUSH/WARMER DISC (DRAPES) ×3 IMPLANT
DRAPE SRG 135X102X78XABS (DRAPES) ×2 IMPLANT
DRSG AQUACEL AG ADV 3.5X14 (GAUZE/BANDAGES/DRESSINGS) ×3 IMPLANT
ELECT BLADE 4.0 EZ CLEAN MEGAD (MISCELLANEOUS) ×3
ELECT REM PT RETURN 9FT ADLT (ELECTROSURGICAL) ×6
ELECTRODE BLDE 4.0 EZ CLN MEGD (MISCELLANEOUS) ×2 IMPLANT
ELECTRODE REM PT RTRN 9FT ADLT (ELECTROSURGICAL) ×4 IMPLANT
FELT TEFLON 1X6 (MISCELLANEOUS) ×6 IMPLANT
GAUZE SPONGE 4X4 12PLY STRL (GAUZE/BANDAGES/DRESSINGS) ×6 IMPLANT
GAUZE SPONGE 4X4 12PLY STRL LF (GAUZE/BANDAGES/DRESSINGS) ×2 IMPLANT
GLOVE BIO SURGEON STRL SZ 6.5 (GLOVE) ×13 IMPLANT
GLOVE BIO SURGEON STRL SZ8.5 (GLOVE) ×3 IMPLANT
GLOVE BIOGEL PI IND STRL 6 (GLOVE) IMPLANT
GLOVE BIOGEL PI IND STRL 8.5 (GLOVE) IMPLANT
GLOVE BIOGEL PI INDICATOR 6 (GLOVE) ×5
GLOVE BIOGEL PI INDICATOR 8.5 (GLOVE) ×4
GOWN STRL REUS W/ TWL LRG LVL3 (GOWN DISPOSABLE) ×8 IMPLANT
GOWN STRL REUS W/TWL LRG LVL3 (GOWN DISPOSABLE) ×27
HEMOSTAT POWDER SURGIFOAM 1G (HEMOSTASIS) ×9 IMPLANT
HEMOSTAT SURGICEL 2X14 (HEMOSTASIS) ×3 IMPLANT
KIT BASIN OR (CUSTOM PROCEDURE TRAY) ×3 IMPLANT
KIT CATH SUCT 8FR (CATHETERS) ×3 IMPLANT
KIT ROOM TURNOVER OR (KITS) ×3 IMPLANT
KIT SUCTION CATH 14FR (SUCTIONS) ×6 IMPLANT
KIT VASOVIEW HEMOPRO VH 3000 (KITS) ×3 IMPLANT
LEAD PACING MYOCARDI (MISCELLANEOUS) ×3 IMPLANT
LIGACLIP MED TITANIUM (CLIP) ×1 IMPLANT
MARKER GRAFT CORONARY BYPASS (MISCELLANEOUS) ×9 IMPLANT
NS IRRIG 1000ML POUR BTL (IV SOLUTION) ×15 IMPLANT
PACK E OPEN HEART (SUTURE) ×3 IMPLANT
PACK OPEN HEART (CUSTOM PROCEDURE TRAY) ×3 IMPLANT
PAD ARMBOARD 7.5X6 YLW CONV (MISCELLANEOUS) ×6 IMPLANT
PAD ELECT DEFIB RADIOL ZOLL (MISCELLANEOUS) ×3 IMPLANT
PENCIL BUTTON HOLSTER BLD 10FT (ELECTRODE) ×3 IMPLANT
PUNCH AORTIC ROTATE  4.5MM 8IN (MISCELLANEOUS) ×1 IMPLANT
SET CARDIOPLEGIA MPS 5001102 (MISCELLANEOUS) ×1 IMPLANT
SOLUTION ANTI FOG 6CC (MISCELLANEOUS) ×1 IMPLANT
SPONGE LAP 18X18 X RAY DECT (DISPOSABLE) ×3 IMPLANT
SUT BONE WAX W31G (SUTURE) ×3 IMPLANT
SUT ETHILON 3 0 FSL (SUTURE) ×1 IMPLANT
SUT MNCRL AB 4-0 PS2 18 (SUTURE) ×1 IMPLANT
SUT PROLENE 3 0 SH1 36 (SUTURE) ×4 IMPLANT
SUT PROLENE 4 0 TF (SUTURE) ×6 IMPLANT
SUT PROLENE 6 0 CC (SUTURE) ×9 IMPLANT
SUT PROLENE 7 0 BV1 MDA (SUTURE) ×5 IMPLANT
SUT PROLENE 8 0 BV175 6 (SUTURE) ×3 IMPLANT
SUT SILK 2 0 SH CR/8 (SUTURE) ×1 IMPLANT
SUT STEEL 6MS V (SUTURE) ×3 IMPLANT
SUT STEEL SZ 6 DBL 3X14 BALL (SUTURE) ×3 IMPLANT
SUT VIC AB 1 CTX 18 (SUTURE) ×6 IMPLANT
SUT VIC AB 2-0 CT1 27 (SUTURE) ×3
SUT VIC AB 2-0 CT1 TAPERPNT 27 (SUTURE) IMPLANT
SYSTEM SAHARA CHEST DRAIN ATS (WOUND CARE) ×3 IMPLANT
TAPE CLOTH SURG 4X10 WHT LF (GAUZE/BANDAGES/DRESSINGS) ×1 IMPLANT
TAPE PAPER 3X10 WHT MICROPORE (GAUZE/BANDAGES/DRESSINGS) ×1 IMPLANT
TOWEL GREEN STERILE (TOWEL DISPOSABLE) ×3 IMPLANT
TOWEL GREEN STERILE FF (TOWEL DISPOSABLE) ×2 IMPLANT
TRAY FOLEY SILVER 16FR TEMP (SET/KITS/TRAYS/PACK) ×3 IMPLANT
TUBING INSUFFLATION (TUBING) ×3 IMPLANT
UNDERPAD 30X30 (UNDERPADS AND DIAPERS) ×3 IMPLANT
WATER STERILE IRR 1000ML POUR (IV SOLUTION) ×6 IMPLANT

## 2017-10-19 NOTE — Progress Notes (Signed)
Initiated Open Heart Rapid Wean per protocol.

## 2017-10-19 NOTE — Anesthesia Procedure Notes (Signed)
Arterial Line Insertion Start/End1/28/2019 6:35 AM, 10/19/2017 6:45 AM Performed by: Wilburn Cornelia, CRNA, CRNA  Patient location: Pre-op. Preanesthetic checklist: patient identified, IV checked, site marked, risks and benefits discussed, surgical consent, monitors and equipment checked, pre-op evaluation, timeout performed and anesthesia consent Lidocaine 1% used for infiltration and patient sedated Left, radial was placed Catheter size: 20 G Hand hygiene performed , maximum sterile barriers used  and Seldinger technique used Allen's test indicative of satisfactory collateral circulation Attempts: 2 Procedure performed without using ultrasound guided technique. Following insertion, dressing applied and Biopatch. Post procedure assessment: normal  Patient tolerated the procedure well with no immediate complications.

## 2017-10-19 NOTE — Anesthesia Postprocedure Evaluation (Signed)
Anesthesia Post Note  [name]  Procedure(s) Performed: CORONARY ARTERY BYPASS GRAFTING (CABG) times four using the right saphaneous vein. Harvested endoscopicly and left internal mammary artery. (N/A Chest) TRANSESOPHAGEAL ECHOCARDIOGRAM (TEE) (N/A )     Patient location during evaluation: SICU Anesthesia Type: General Level of consciousness: sedated Pain management: pain level controlled Vital Signs Assessment: post-procedure vital signs reviewed and stable Respiratory status: patient remains intubated per anesthesia plan Cardiovascular status: stable Postop Assessment: no apparent nausea or vomiting Anesthetic complications: no    Last Vitals:  Vitals:   10/19/17 0454 10/19/17 1355  BP: 112/63 (!) 107/56  Pulse: 69 89  Resp: 20 12  Temp: (!) 36.3 C   SpO2: 100% 100%    Last Pain:  Vitals:   10/19/17 0454  TempSrc: Oral  PainSc: 0-No pain                 Koltan Portocarrero,W. EDMOND

## 2017-10-19 NOTE — Progress Notes (Signed)
Received lab result Hg 5.8 from lab. Questionable result as istat result was also questionable. All labs are being redrawn.

## 2017-10-19 NOTE — Progress Notes (Signed)
  Echocardiogram Echocardiogram Transesophageal has been performed.  Bobbye Charleston 10/19/2017, 1:35 PM

## 2017-10-19 NOTE — Progress Notes (Signed)
      [address]       [place] [address]             [phone]     Pre Procedure note for inpatients:   Victoria Eaton has been scheduled for Procedure(s): CORONARY ARTERY BYPASS GRAFTING (CABG) (N/A) TRANSESOPHAGEAL ECHOCARDIOGRAM (TEE) (N/A) today.   The goals risks and alternatives of the planned surgical procedure Procedure(s): CORONARY ARTERY BYPASS GRAFTING (CABG) (N/A) TRANSESOPHAGEAL ECHOCARDIOGRAM (TEE) (N/A)  have been discussed with the patient in detail. The risks of the procedure including death, infection, stroke, myocardial infarction, bleeding, blood transfusion have all been discussed specifically.  I have quoted Victoria Eaton a 3 % of perioperative mortality and a complication rate as high as 30%. The patient's questions have been answered.Victoria Eaton is willing  to proceed with the planned procedure. After consideration of the risks, benefits and treatment options the patient has consented to the planned procedure.   The patient has been seen and labs reviewed. There are no changes in the patient's condition to prevent proceeding with the planned procedure today.  Recent labs:  Lab Results  Component Value Date   WBC 5.5 10/18/2017   HGB 10.3 (L) 10/18/2017   HCT 32.2 (L) 10/18/2017   PLT 260 10/18/2017   GLUCOSE 326 (H) 10/18/2017   CHOL 137 10/10/2017   TRIG 144 10/10/2017   HDL 38 (L) 10/10/2017   LDLCALC 70 10/10/2017   ALT 50 10/16/2017   AST 46 (H) 10/16/2017   NA 132 (L) 10/18/2017   K 4.3 10/18/2017   CL 99 (L) 10/18/2017   CREATININE 1.14 (H) 10/18/2017   BUN 22 (H) 10/18/2017   CO2 24 10/18/2017   INR 1.01 10/10/2017   HGBA1C 8.5 (H) 10/18/2017    Grace Isaac, MD 10/19/2017 7:12 AM

## 2017-10-19 NOTE — Procedures (Signed)
Extubation Procedure Note  Patient Details:   Name: Marquette Piontek DOB: October 27, 1960 MRN: [id number]   Airway Documentation:     Evaluation  O2 sats: stable throughout Complications: No apparent complications Patient did tolerate procedure well. Bilateral Breath Sounds: Diminished   Yes   Pt extubated to 3L [place] per Open Heart Rapid Wean Protocol. Pt able to speak and has a weak non-productive cough post extubation. VS, ABG within normal limits, NIF -22, VC 0.90L. Pt encouraged to use Yankauer to clear secretions and instructed on use of Incentive Spirometer. RN at bedside. RT will continue to monitor.   Jesse Sans 10/19/2017, 6:00 PM

## 2017-10-19 NOTE — Anesthesia Procedure Notes (Signed)
Procedure Name: Intubation Date/Time: 10/19/2017 7:40 AM Performed by: Wilburn Cornelia, CRNA Pre-anesthesia Checklist: Patient identified, Emergency Drugs available, Suction available, Timeout performed and Patient being monitored Patient Re-evaluated:Patient Re-evaluated prior to induction Oxygen Delivery Method: Circle system utilized Preoxygenation: Pre-oxygenation with 100% oxygen Induction Type: IV induction Ventilation: Mask ventilation without difficulty Laryngoscope Size: Miller and 2 Grade View: Grade I Tube type: Subglottic suction tube Tube size: 8.0 mm Number of attempts: 1 Airway Equipment and Method: Stylet Placement Confirmation: ETT inserted through vocal cords under direct vision,  positive ETCO2,  CO2 detector and breath sounds checked- equal and bilateral Secured at: 21 cm Tube secured with: Tape Dental Injury: Teeth and Oropharynx as per pre-operative assessment

## 2017-10-19 NOTE — Anesthesia Procedure Notes (Signed)
Central Venous Catheter Insertion Performed by: Audry Pili, MD, anesthesiologist Start/End1/28/2019 6:49 AM, 10/19/2017 6:50 AM Patient location: Pre-op. Preanesthetic checklist: patient identified, IV checked, site marked, risks and benefits discussed, surgical consent, monitors and equipment checked, pre-op evaluation, timeout performed and anesthesia consent Hand hygiene performed  and maximum sterile barriers used  Total catheter length 10. PA cath was placed.Swan type:thermodilution PA Cath depth:45 Procedure performed without using ultrasound guided technique. Attempts: 1 Patient tolerated the procedure well with no immediate complications.

## 2017-10-19 NOTE — Addendum Note (Signed)
Addendum  created 10/19/17 1458 by Myna Bright, CRNA   Intraprocedure Event edited

## 2017-10-19 NOTE — Anesthesia Procedure Notes (Signed)
Central Venous Catheter Insertion Performed by: Audry Pili, MD, anesthesiologist Start/End1/28/2019 6:33 AM, 10/19/2017 6:49 AM Patient location: Pre-op. Preanesthetic checklist: patient identified, IV checked, risks and benefits discussed, surgical consent, monitors and equipment checked, pre-op evaluation, timeout performed and anesthesia consent Position: Trendelenburg Lidocaine 1% used for infiltration and patient sedated Hand hygiene performed , maximum sterile barriers used  and Seldinger technique used Catheter size: 8.5 Fr Total catheter length 10. Central line was placed.Sheath introducer Swan type:thermodilution PA Cath depth:45 Procedure performed using ultrasound guided technique. Ultrasound Notes:anatomy identified, needle tip was noted to be adjacent to the nerve/plexus identified, no ultrasound evidence of intravascular and/or intraneural injection and image(s) printed for medical record Attempts: 1 Following insertion, line sutured, dressing applied and Biopatch. Post procedure assessment: blood return through all ports, free fluid flow and no air  Patient tolerated the procedure well with no immediate complications.

## 2017-10-19 NOTE — Brief Op Note (Signed)
      [address]       [place] [address]             [phone]      10/19/2017  7:28 AM  PATIENT:  Victoria Eaton  57 y.o. female  PRE-OPERATIVE DIAGNOSIS:  CAD  POST-OPERATIVE DIAGNOSIS:  CAD  PROCEDURE:  Procedure(s):  CORONARY ARTERY BYPASS GRAFTING x 4 -LIMA to LAD -SVG to DIAGONAL -SVG to OM -SVG to PDA  ENDOSCOPIC HARVEST GREATER SAPHENOUS VEIN -Right Leg  TRANSESOPHAGEAL ECHOCARDIOGRAM (TEE) (N/A)  SURGEON:  Surgeon(s) and Role:    * Grace Isaac, MD - Primary  PHYSICIAN ASSISTANT: Ellwood Handler PA-C  ANESTHESIA:   general  EBL:  750 mL   BLOOD ADMINISTERED: CELLSAVER  DRAINS: Left Pleural Chest Tubes, Mediastinal Chest Tubes   LOCAL MEDICATIONS USED:  NONE  SPECIMEN:  No Specimen  DISPOSITION OF SPECIMEN:  N/A  COUNTS:  YES   DICTATION: .Dragon Dictation  PLAN OF CARE: Admit to inpatient   PATIENT DISPOSITION:  ICU - intubated and hemodynamically stable.   Delay start of Pharmacological VTE agent (>24hrs) due to surgical blood loss or risk of bleeding: yes

## 2017-10-19 NOTE — Progress Notes (Signed)
Pt has voided monitor off with no complaints of pain. Report given to Kennan.

## 2017-10-19 NOTE — Progress Notes (Signed)
Patient ID: Victoria Eaton, female   DOB: 02/21/1961, 57 y.o.   MRN: [id number]  TCTS Evening Rounds:   Hemodynamically stable  CI = 2.7  Extubated  Urine output good  CT output low  CBC    Component Value Date/Time   WBC 8.3 10/19/2017 1436   RBC 3.32 (L) 10/19/2017 1436   HGB 8.9 (L) 10/19/2017 1436   HGB 12.9 05/01/2017 0939   HCT 27.5 (L) 10/19/2017 1436   HCT 39.9 05/01/2017 0939   PLT 166 10/19/2017 1436   PLT 206 05/01/2017 0939   MCV 82.8 10/19/2017 1436   MCV 85 05/01/2017 0939   MCH 26.8 10/19/2017 1436   MCHC 32.4 10/19/2017 1436   RDW 18.4 (H) 10/19/2017 1436   RDW 16.3 (H) 05/01/2017 0939   LYMPHSABS 0.5 (L) 10/01/2017 2348   LYMPHSABS 2.2 05/01/2017 0939   MONOABS 0.5 10/01/2017 2348   EOSABS 0.1 10/01/2017 2348   EOSABS 0.3 05/01/2017 0939   BASOSABS 0.0 10/01/2017 2348   BASOSABS 0.0 05/01/2017 0939     BMET    Component Value Date/Time   NA 141 10/19/2017 1413   NA 141 09/07/2017 1455   K 3.4 (L) 10/19/2017 1413   CL 102 10/19/2017 1247   CO2 24 10/18/2017 2328   GLUCOSE 103 (H) 10/19/2017 1413   BUN 16 10/19/2017 1247   BUN 18 09/07/2017 1455   CREATININE 0.70 10/19/2017 1247   CALCIUM 9.1 10/18/2017 2328   GFRNONAA 52 (L) 10/18/2017 2328   GFRAA >60 10/18/2017 2328     A/P:  Stable postop course. Continue current plans

## 2017-10-19 NOTE — Transfer of Care (Signed)
Immediate Anesthesia Transfer of Care Note  Patient: Bill Mcvey  Procedure(s) Performed: CORONARY ARTERY BYPASS GRAFTING (CABG) times four using the right saphaneous vein. Harvested endoscopicly and left internal mammary artery. (N/A Chest) TRANSESOPHAGEAL ECHOCARDIOGRAM (TEE) (N/A )  Patient Location: SICU  Anesthesia Type:General  Level of Consciousness: Patient remains intubated per anesthesia plan  Airway & Oxygen Therapy: Patient remains intubated per anesthesia plan and Patient placed on Ventilator (see vital sign flow sheet for setting)  Post-op Assessment: Report given to RN and Post -op Vital signs reviewed and stable  Post vital signs: Reviewed and stable  Last Vitals:  Vitals:   10/19/17 0225 10/19/17 0454  BP:  112/63  Pulse: (!) 56 69  Resp:  20  Temp:  (!) 36.3 C  SpO2:  100%   HR 90, sats 100% placed on vent by RT, 105/55(69), RR 12  Last Pain:  Vitals:   10/19/17 0454  TempSrc: Oral  PainSc: 0-No pain      Patients Stated Pain Goal: 0 (84/21/03 1281)  Complications: No apparent anesthesia complications

## 2017-10-20 ENCOUNTER — Inpatient Hospital Stay (HOSPITAL_COMMUNITY): Payer: BLUE CROSS/BLUE SHIELD

## 2017-10-20 ENCOUNTER — Encounter (HOSPITAL_COMMUNITY): Payer: Self-pay | Admitting: Cardiothoracic Surgery

## 2017-10-20 DIAGNOSIS — I25110 Atherosclerotic heart disease of native coronary artery with unstable angina pectoris: Principal | ICD-10-CM

## 2017-10-20 LAB — CBC
HCT: 24.6 % — ABNORMAL LOW (ref 36.0–46.0)
HCT: 25.5 % — ABNORMAL LOW (ref 36.0–46.0)
HEMOGLOBIN: 7.8 g/dL — AB (ref 12.0–15.0)
Hemoglobin: 7.9 g/dL — ABNORMAL LOW (ref 12.0–15.0)
MCH: 26.7 pg (ref 26.0–34.0)
MCH: 26.5 pg (ref 26.0–34.0)
MCHC: 31.7 g/dL (ref 30.0–36.0)
MCHC: 31 g/dL (ref 30.0–36.0)
MCV: 84.2 fL (ref 78.0–100.0)
MCV: 85.6 fL (ref 78.0–100.0)
PLATELETS: 121 10*3/uL — AB (ref 150–400)
Platelets: 163 10*3/uL (ref 150–400)
RBC: 2.92 MIL/uL — AB (ref 3.87–5.11)
RBC: 2.98 MIL/uL — AB (ref 3.87–5.11)
RDW: 19 % — ABNORMAL HIGH (ref 11.5–15.5)
RDW: 19.1 % — ABNORMAL HIGH (ref 11.5–15.5)
WBC: 7.4 10*3/uL (ref 4.0–10.5)
WBC: 7.4 10*3/uL (ref 4.0–10.5)

## 2017-10-20 LAB — POCT I-STAT, CHEM 8
BUN: 14 mg/dL (ref 6–20)
Calcium, Ion: 1.25 mmol/L (ref 1.15–1.40)
Chloride: 103 mmol/L (ref 101–111)
Creatinine, Ser: 0.7 mg/dL (ref 0.44–1.00)
Glucose, Bld: 167 mg/dL — ABNORMAL HIGH (ref 65–99)
HCT: 25 % — ABNORMAL LOW (ref 36.0–46.0)
Hemoglobin: 8.5 g/dL — ABNORMAL LOW (ref 12.0–15.0)
Potassium: 4.2 mmol/L (ref 3.5–5.1)
Sodium: 136 mmol/L (ref 135–145)
TCO2: 22 mmol/L (ref 22–32)

## 2017-10-20 LAB — GLUCOSE, CAPILLARY
Glucose-Capillary: 118 mg/dL — ABNORMAL HIGH (ref 65–99)
Glucose-Capillary: 119 mg/dL — ABNORMAL HIGH (ref 65–99)
Glucose-Capillary: 115 mg/dL — ABNORMAL HIGH (ref 65–99)
Glucose-Capillary: 110 mg/dL — ABNORMAL HIGH (ref 65–99)
Glucose-Capillary: 125 mg/dL — ABNORMAL HIGH (ref 65–99)
Glucose-Capillary: 132 mg/dL — ABNORMAL HIGH (ref 65–99)
Glucose-Capillary: 142 mg/dL — ABNORMAL HIGH (ref 65–99)
Glucose-Capillary: 141 mg/dL — ABNORMAL HIGH (ref 65–99)
Glucose-Capillary: 155 mg/dL — ABNORMAL HIGH (ref 65–99)
Glucose-Capillary: 147 mg/dL — ABNORMAL HIGH (ref 65–99)
Glucose-Capillary: 168 mg/dL — ABNORMAL HIGH (ref 65–99)
Glucose-Capillary: 118 mg/dL — ABNORMAL HIGH (ref 65–99)
Glucose-Capillary: 148 mg/dL — ABNORMAL HIGH (ref 65–99)
Glucose-Capillary: 125 mg/dL — ABNORMAL HIGH (ref 65–99)
Glucose-Capillary: 124 mg/dL — ABNORMAL HIGH (ref 65–99)
Glucose-Capillary: 127 mg/dL — ABNORMAL HIGH (ref 65–99)
Glucose-Capillary: 171 mg/dL — ABNORMAL HIGH (ref 65–99)

## 2017-10-20 LAB — BASIC METABOLIC PANEL
ANION GAP: 5 (ref 5–15)
BUN: 15 mg/dL (ref 6–20)
CALCIUM: 8.1 mg/dL — AB (ref 8.9–10.3)
CHLORIDE: 109 mmol/L (ref 101–111)
CO2: 22 mmol/L (ref 22–32)
Creatinine, Ser: 0.77 mg/dL (ref 0.44–1.00)
GFR calc non Af Amer: >60 mL/min (ref >60)
GLUCOSE: 125 mg/dL — AB (ref 65–99)
POTASSIUM: 4 mmol/L (ref 3.5–5.1)
Sodium: 136 mmol/L (ref 135–145)

## 2017-10-20 LAB — MAGNESIUM
MAGNESIUM: 2 mg/dL (ref 1.7–2.4)
Magnesium: 2.5 mg/dL — ABNORMAL HIGH (ref 1.7–2.4)

## 2017-10-20 LAB — LIPASE, BLOOD: LIPASE: 22 U/L (ref 11–51)

## 2017-10-20 LAB — CREATININE, SERUM
CREATININE: 0.83 mg/dL (ref 0.44–1.00)
GFR calc Af Amer: >60 mL/min (ref >60)

## 2017-10-20 LAB — AMYLASE: AMYLASE: 25 U/L — AB (ref 28–100)

## 2017-10-20 MED ORDER — INSULIN DETEMIR 100 UNIT/ML ~~LOC~~ SOLN
12.0000 [IU] | Freq: Every day | SUBCUTANEOUS | Status: DC
  Administered 2017-10-21: 12 [IU] via SUBCUTANEOUS
  Filled 2017-10-20 (×2): qty 0.12

## 2017-10-20 MED ORDER — INSULIN DETEMIR 100 UNIT/ML ~~LOC~~ SOLN
12.0000 [IU] | Freq: Once | SUBCUTANEOUS | Status: AC
  Administered 2017-10-20: 12 [IU] via SUBCUTANEOUS
  Filled 2017-10-20: qty 0.12

## 2017-10-20 MED ORDER — ENOXAPARIN SODIUM 30 MG/0.3ML ~~LOC~~ SOLN
30.0000 mg | Freq: Every day | SUBCUTANEOUS | Status: DC
  Administered 2017-10-20 – 2017-10-24 (×5): 30 mg via SUBCUTANEOUS
  Filled 2017-10-20 (×5): qty 0.3

## 2017-10-20 MED ORDER — INSULIN ASPART 100 UNIT/ML ~~LOC~~ SOLN
0.0000 [IU] | SUBCUTANEOUS | Status: DC
  Administered 2017-10-20: 4 [IU] via SUBCUTANEOUS
  Administered 2017-10-20: 2 [IU] via SUBCUTANEOUS
  Administered 2017-10-21: 4 [IU] via SUBCUTANEOUS
  Administered 2017-10-21 (×3): 2 [IU] via SUBCUTANEOUS

## 2017-10-20 NOTE — Progress Notes (Signed)
Patient ID: Keyosha Tiedt, female   DOB: 11-07-1960, 57 y.o.   MRN: [id number] EVENING ROUNDS NOTE :     [address]       [place] [address]             [phone]                 1 Day Post-Op Procedure(s) (LRB): CORONARY ARTERY BYPASS GRAFTING (CABG) times four using the right saphaneous vein. Harvested endoscopicly and left internal mammary artery. (N/A) TRANSESOPHAGEAL ECHOCARDIOGRAM (TEE) (N/A)  Total Length of Stay:  LOS: 10 days  BP 111/60   Pulse 80   Temp 97.9 F (36.6 C) (Oral)   Resp 13   Ht 5' 6"  (1.676 m)   Wt 164 lb 10.9 oz (74.7 kg)   SpO2 93%   BMI 26.58 kg/m   .Intake/Output      01/29 0701 - 01/30 0700   I.V. (mL/kg) 365.2 (4.9)   Blood    IV Piggyback 150   Total Intake(mL/kg) 515.2 (6.9)   Urine (mL/kg/hr) 535 (0.6)   Blood    Chest Tube 80   Total Output 615   Net -99.8         . sodium chloride Stopped (10/20/17 1000)  . sodium chloride Stopped (10/20/17 1000)  . sodium chloride Stopped (10/20/17 1000)  . dexmedetomidine (PRECEDEX) IV infusion Stopped (10/19/17 1708)  . lactated ringers    . lactated ringers Stopped (10/20/17 1000)  . lactated ringers 20 mL/hr at 10/20/17 1800  . nitroGLYCERIN Stopped (10/19/17 1409)  . phenylephrine (NEO-SYNEPHRINE) Adult infusion Stopped (10/20/17 1200)     Lab Results  Component Value Date   WBC 7.4 10/20/2017   HGB 8.5 (L) 10/20/2017   HCT 25.0 (L) 10/20/2017   PLT 121 (L) 10/20/2017   GLUCOSE 167 (H) 10/20/2017   CHOL 137 10/10/2017   TRIG 144 10/10/2017   HDL 38 (L) 10/10/2017   LDLCALC 70 10/10/2017   ALT 50 10/16/2017   AST 46 (H) 10/16/2017   NA 136 10/20/2017   K 4.2 10/20/2017   CL 103 10/20/2017   CREATININE 0.70 10/20/2017   BUN 14 10/20/2017   CO2 22 10/20/2017   INR 1.24 10/19/2017   HGBA1C 8.5 (H) 10/18/2017   Stable day  Walked  Bedias MD  Beeper [phone] Office [phone] 10/20/2017 7:26 PM

## 2017-10-20 NOTE — Plan of Care (Signed)
  Progressing Activity: Risk for activity intolerance will decrease 10/20/2017 2104 - Progressing by Netta Corrigan, RN Note Pt sat up in the chair for four hours today and ambulate a small loop around the unit once without difficulty.  Cardiac: Hemodynamic stability will improve 10/20/2017 2104 - Progressing by Netta Corrigan, RN Note Pt is maintaining adequate cardiovascular stability.  Urinary Elimination: Ability to achieve and maintain adequate renal perfusion and functioning will improve 10/20/2017 2104 - Progressing by Netta Corrigan, RN Note Pt is maintaining adequate urine output.

## 2017-10-20 NOTE — Progress Notes (Signed)
Progress Note  Patient Name: Victoria Eaton Date of Encounter: 10/20/2017  Primary Cardiologist: Pixie Casino, MD   Subjective   C/o chest wall pain.   Inpatient Medications    Scheduled Meds: . acetaminophen  1,000 mg Oral Q6H   Or  . acetaminophen (TYLENOL) oral liquid 160 mg/5 mL  1,000 mg Per Tube Q6H  . aspirin EC  325 mg Oral Daily   Or  . aspirin  324 mg Per Tube Daily  . atorvastatin  80 mg Oral q1800  . bisacodyl  10 mg Oral Daily   Or  . bisacodyl  10 mg Rectal Daily  . chlorhexidine gluconate (MEDLINE KIT)  15 mL Mouth Rinse BID  . docusate sodium  200 mg Oral Daily  . enoxaparin (LOVENOX) injection  30 mg Subcutaneous QHS  . insulin aspart  0-24 Units Subcutaneous Q4H  . insulin detemir  12 Units Subcutaneous Once  . [START ON 10/21/2017] insulin detemir  12 Units Subcutaneous Daily  . insulin regular  0-10 Units Intravenous TID WC  . levalbuterol  0.63 mg Nebulization Q6H  . mouth rinse  15 mL Mouth Rinse QID  . metoprolol tartrate  12.5 mg Oral BID   Or  . metoprolol tartrate  12.5 mg Per Tube BID  . [START ON 10/21/2017] pantoprazole  40 mg Oral Daily  . sodium chloride flush  3 mL Intravenous Q12H   Continuous Infusions: . sodium chloride Stopped (10/20/17 1000)  . sodium chloride Stopped (10/20/17 1000)  . sodium chloride Stopped (10/20/17 1000)  . albumin human    . dexmedetomidine (PRECEDEX) IV infusion Stopped (10/19/17 1708)  . insulin (NOVOLIN-R) infusion 6.3 Units/hr (10/20/17 1000)  . lactated ringers    . lactated ringers Stopped (10/20/17 1000)  . lactated ringers 20 mL/hr at 10/20/17 1000  . levofloxacin (LEVAQUIN) IV 750 mg (10/20/17 0905)  . milrinone Stopped (10/19/17 1400)  . nitroGLYCERIN Stopped (10/19/17 1409)  . phenylephrine (NEO-SYNEPHRINE) Adult infusion 10 mcg/min (10/20/17 1000)   PRN Meds: sodium chloride, albumin human, lactated ringers, metoprolol tartrate, midazolam, morphine injection, ondansetron (ZOFRAN) IV,  oxyCODONE, sodium chloride flush, traMADol   Vital Signs    Vitals:   10/20/17 0700 10/20/17 0758 10/20/17 0900 10/20/17 1000  BP: (!) 109/57  110/61 (!) 93/55  Pulse:      Resp: (!) 27  (!) 27 18  Temp: 98.2 F (36.8 C)  97.9 F (36.6 C)   TempSrc:      SpO2: 96% 95% 97% 91%  Weight:      Height:        Intake/Output Summary (Last 24 hours) at 10/20/2017 1017 Last data filed at 10/20/2017 1000 Gross per 24 hour  Intake 5148.05 ml  Output 2980 ml  Net 2168.05 ml   Filed Weights   10/18/17 0418 10/19/17 0454 10/20/17 0500  Weight: 149 lb 1.6 oz (67.6 kg) 149 lb 3.2 oz (67.7 kg) 164 lb 10.9 oz (74.7 kg)    Telemetry    NSR - Personally Reviewed  ECG    Sinus, lateral TWI, inferior Q waves, unchanged - Personally Reviewed  Physical Exam   GEN: No acute distress.   Neck: No JVD Cardiac: RRR, no murmurs, rubs, or gallops.  Respiratory: Bibasilar crackles.  GI: Soft, nontender, non-distended  MS: 1+ bilateral lower ext edema; No deformity. Neuro:  Nonfocal  Psych: Normal affect   Labs    Chemistry Recent Labs  Lab 10/14/17 1157  10/15/17 0242 10/16/17 0229 10/18/17 2328  10/19/17 1247 10/19/17 1413 10/19/17 1934 10/19/17 1941 10/20/17 0414  NA 138  --  137 138 132*   < > 140 141  --  139 136  K 3.8  --  3.9 4.8 4.3   < > 3.4* 3.4*  --  4.4 4.0  CL 104  --  104 105 99*   < > 102  --   --  105 109  CO2 23  --  _0 --   --   --   --   --  22  GLUCOSE 171*  --  156* 183* 326*   < > 141* 103*  --  109* 125*  BUN 6  --  9 9 22*   < > 16  --   --  16 15  CREATININE 1.04*   < > 0.97 1.10* 1.14*   < > 0.70  --  0.78 0.70 0.77  CALCIUM 8.8*  --  9.0 9.3 9.1  --   --   --   --   --  8.1*  PROT 6.2*  --  6.6 7.1  --   --   --   --   --   --   --   ALBUMIN 2.6*  --  2.8* 3.0*  --   --   --   --   --   --   --   AST 38  --  40 46*  --   --   --   --   --   --   --   ALT 38  --  43 50  --   --   --   --   --   --   --   ALKPHOS 121  --  131* 125  --   --   --    --   --   --   --   BILITOT 0.6  --  0.5 0.6  --   --   --   --   --   --   --   GFRNONAA 58*   < > >60 54* 52*  --   --   --  >60  --  >60  GFRAA >60   < > >60 >60 >60  --   --   --  >60  --  >60  ANIONGAP 11  --  _1 --   --   --   --   --  5   < > = values in this interval not displayed.     Hematology Recent Labs  Lab 10/19/17 1436 10/19/17 1934 10/19/17 1941 10/20/17 0414  WBC 8.3 8.7  --  7.4  RBC 3.32* 3.18*  --  2.92*  HGB 8.9* 8.4* 8.8* 7.8*  HCT 27.5* 26.4* 26.0* 24.6*  MCV 82.8 83.0  --  84.2  MCH 26.8 26.4  --  26.7  MCHC 32.4 31.8  --  31.7  RDW 18.4* 18.3*  --  19.0*  PLT 166 174  --  163    Cardiac EnzymesNo results for input(s): TROPONINI in the last 168 hours. No results for input(s): TROPIPOC in the last 168 hours.   BNPNo results for input(s): BNP, PROBNP in the last 168 hours.   DDimer No results for input(s): DDIMER in the last 168 hours.   Radiology    Dg Chest Port 1 View  Result Date: 10/20/2017 CLINICAL DATA:  Prior CABG.  Follow-up exam. EXAM: PORTABLE CHEST 1 VIEW COMPARISON:  10/19/2017. FINDINGS: Interim extubation removal of NG tube. Swan-Ganz catheter and mediastinal drainage catheter stable position. Left chest tube in stable position. Tiny left pneumothorax noted on today's exam. Prior CABG. Cardiomegaly with slight increase in basilar interstitial prominence. Mild CHF cannot be excluded. Small left pleural effusion. IMPRESSION: 1. Interim extubation removal of NG tube. Swan-Ganz catheter mediastinal drainage catheter stable position. Left chest tube in stable position. There is a tiny left pneumothorax on today's exam. 2. Prior CABG. Cardiomegaly with slight increase in basilar interstitial prominence and small left pleural effusion. Mild CHF cannot be excluded. Critical Value/emergent results were called by telephone at the time of interpretation on 10/20/2017 at 8:45 am to nurse [place], who verbally acknowledged these results.  Electronically Signed   By: Marcello Moores  Register   On: 10/20/2017 08:49   Dg Chest Port 1 View  Result Date: 10/19/2017 CLINICAL DATA:  Status post CABG. EXAM: PORTABLE CHEST 1 VIEW COMPARISON:  10/10/2017. FINDINGS: Patient is status post CABG. The heart is enlarged. LEFT chest tube good position. Orogastric tube in stomach. ETT 2.7 cm above carina. Swan-Ganz catheter tip outflow tract of the RIGHT ventricle. No pneumothorax or consolidation. Mediastinal tube overlies the midline. IMPRESSION: Satisfactory postoperative chest. Electronically Signed   By: Staci Righter M.D.   On: 10/19/2017 14:49    Cardiac Studies   Cardiac cath 10/14/17:  Prox RCA lesion is 80% stenosed.  Mid RCA to Dist RCA lesion is 70% stenosed.  Dist RCA lesion is 70% stenosed.  Prox LAD to Mid LAD lesion is 90% stenosed.  Mid LAD to Dist LAD lesion is 85% stenosed.  Ost 1st Diag lesion is 80% stenosed.  Lat 1st Diag lesion is 90% stenosed.  1st Diag lesion is 90% stenosed.  Ost 1st Mrg to 1st Mrg lesion is 90% stenosed.  There is moderate left ventricular systolic dysfunction.  LV end diastolic pressure is mildly elevated.  The left ventricular ejection fraction is 35-45% by visual estimate.     Patient Profile     57 y.o. female with CAD/UA now s/p CABG on 10/19/17  Assessment & Plan    1. CAD/NSTEMI: Pt admitted with unstable angina. Cardiac cath with severe multivessel CAD. She is now one day post CABG. She is hemodynamically stable on neosynephrine with volume overload. Agree with diuresis. Rhythm stable.   For questions or updates, please contact [place] Please consult [email or web address] for contact info under Cardiology/STEMI.      Signed, Lauree Chandler, MD  10/20/2017, 10:17 AM

## 2017-10-20 NOTE — Progress Notes (Signed)
Patient ID: Victoria Eaton, female   DOB: [birth year], 59 y.o.   MRN: [id number] TCTS DAILY ICU PROGRESS NOTE                   [address]            [place] [address]          [phone]   1 Day Post-Op Procedure(s) (LRB): CORONARY ARTERY BYPASS GRAFTING (CABG) times four using the right saphaneous vein. Harvested endoscopicly and left internal mammary artery. (N/A) TRANSESOPHAGEAL ECHOCARDIOGRAM (TEE) (N/A)  Total Length of Stay:  LOS: 10 days   Subjective: Patient awake alert extubated last night, pain control is an issue fluctuating between 7 out of 10 pain and sleep Objective: Vital signs in last 24 hours: Temp:  [93 F (33.9 C)-98.6 F (37 C)] 98.2 F (36.8 C) (01/29 0700) Pulse Rate:  [80-89] 80 (01/28 2000) Cardiac Rhythm: Atrial paced (01/29 0700) Resp:  [10-29] 27 (01/29 0700) BP: (82-114)/(52-94) 109/57 (01/29 0700) SpO2:  [94 %-100 %] 96 % (01/29 0700) Arterial Line BP: (93-123)/(43-63) 123/48 (01/29 0700) FiO2 (%):  [50 %] 50 % (01/28 1555) Weight:  [164 lb 10.9 oz (74.7 kg)] 164 lb 10.9 oz (74.7 kg) (01/29 0500)  Filed Weights   10/18/17 0418 10/19/17 0454 10/20/17 0500  Weight: 149 lb 1.6 oz (67.6 kg) 149 lb 3.2 oz (67.7 kg) 164 lb 10.9 oz (74.7 kg)    Weight change: 15 lb 7.7 oz (7.023 kg)   Hemodynamic parameters for last 24 hours: PAP: (15-28)/(1-20) 19/13 CO:  [2.9 L/min-5.9 L/min] 5.9 L/min CI:  [1.7 L/min/m2-3.4 L/min/m2] 3.4 L/min/m2  Intake/Output from previous day: 01/28 0701 - 01/29 0700 In: 4817.3 [I.V.:4063.3; Blood:354; IV Piggyback:400] Out: 2860 [Urine:1720; Blood:750; Chest Tube:390]  Intake/Output this shift: No intake/output data recorded.  Current Meds: Scheduled Meds: . acetaminophen  1,000 mg Oral Q6H   Or  . acetaminophen (TYLENOL) oral liquid 160 mg/5 mL  1,000 mg Per Tube Q6H  . aspirin EC  325 mg Oral Daily   Or  . aspirin  324 mg Per Tube Daily  . atorvastatin  80 mg Oral q1800  . bisacodyl  10 mg  Oral Daily   Or  . bisacodyl  10 mg Rectal Daily  . chlorhexidine gluconate (MEDLINE KIT)  15 mL Mouth Rinse BID  . docusate sodium  200 mg Oral Daily  . insulin regular  0-10 Units Intravenous TID WC  . levalbuterol  0.63 mg Nebulization Q6H  . mouth rinse  15 mL Mouth Rinse QID  . metoprolol tartrate  12.5 mg Oral BID   Or  . metoprolol tartrate  12.5 mg Per Tube BID  . [START ON 10/21/2017] pantoprazole  40 mg Oral Daily  . sodium chloride flush  3 mL Intravenous Q12H   Continuous Infusions: . sodium chloride 10 mL/hr at 10/20/17 0700  . sodium chloride 250 mL (10/20/17 0700)  . sodium chloride 10 mL/hr at 10/20/17 0700  . albumin human    . dexmedetomidine (PRECEDEX) IV infusion Stopped (10/19/17 1708)  . insulin (NOVOLIN-R) infusion 2.3 Units/hr (10/20/17 0700)  . lactated ringers    . lactated ringers 20 mL/hr at 10/20/17 0700  . lactated ringers 20 mL/hr at 10/20/17 0700  . levofloxacin (LEVAQUIN) IV    . milrinone Stopped (10/19/17 1400)  . nitroGLYCERIN Stopped (10/19/17 1409)  . phenylephrine (NEO-SYNEPHRINE) Adult infusion 20 mcg/min (10/20/17 0700)   PRN Meds:.sodium chloride, albumin human, lactated ringers, metoprolol  tartrate, midazolam, morphine injection, ondansetron (ZOFRAN) IV, oxyCODONE, sodium chloride flush, traMADol  General appearance: alert, cooperative and mild distress Neurologic: intact Heart: regular rate and rhythm, S1, S2 normal, no murmur, click, rub or gallop Lungs: diminished breath sounds bibasilar Abdomen: soft, non-tender; bowel sounds normal; no masses,  no organomegaly Extremities: extremities normal, atraumatic, no cyanosis or edema and Homans sign is negative, no sign of DVT Wound: Sternum stable  Lab Results: CBC: Recent Labs    10/19/17 1934 10/19/17 1941 10/20/17 0414  WBC 8.7  --  7.4  HGB 8.4* 8.8* 7.8*  HCT 26.4* 26.0* 24.6*  PLT 174  --  163   BMET:  Recent Labs    10/18/17 2328  10/19/17 1941 10/20/17 0414  NA  132*   < > 139 136  K 4.3   < > 4.4 4.0  CL 99*   < > 105 109  CO2 24  --   --  22  GLUCOSE 326*   < > 109* 125*  BUN 22*   < > 16 15  CREATININE 1.14*   < > 0.70 0.77  CALCIUM 9.1  --   --  8.1*   < > = values in this interval not displayed.    CMET: Lab Results  Component Value Date   WBC 7.4 10/20/2017   HGB 7.8 (L) 10/20/2017   HCT 24.6 (L) 10/20/2017   PLT 163 10/20/2017   GLUCOSE 125 (H) 10/20/2017   CHOL 137 10/10/2017   TRIG 144 10/10/2017   HDL 38 (L) 10/10/2017   LDLCALC 70 10/10/2017   ALT 50 10/16/2017   AST 46 (H) 10/16/2017   NA 136 10/20/2017   K 4.0 10/20/2017   CL 109 10/20/2017   CREATININE 0.77 10/20/2017   BUN 15 10/20/2017   CO2 22 10/20/2017   INR 1.24 10/19/2017   HGBA1C 8.5 (H) 10/18/2017      PT/INR:  Recent Labs    10/19/17 1436  LABPROT 15.5*  INR 1.24   Radiology: Dg Chest Port 1 View  Result Date: 10/19/2017 CLINICAL DATA:  Status post CABG. EXAM: PORTABLE CHEST 1 VIEW COMPARISON:  10/10/2017. FINDINGS: Patient is status post CABG. The heart is enlarged. LEFT chest tube good position. Orogastric tube in stomach. ETT 2.7 cm above carina. Swan-Ganz catheter tip outflow tract of the RIGHT ventricle. No pneumothorax or consolidation. Mediastinal tube overlies the midline. IMPRESSION: Satisfactory postoperative chest. Electronically Signed   By: Staci Righter M.D.   On: 10/19/2017 14:49     Assessment/Plan: S/P Procedure(s) (LRB): CORONARY ARTERY BYPASS GRAFTING (CABG) times four using the right saphaneous vein. Harvested endoscopicly and left internal mammary artery. (N/A) TRANSESOPHAGEAL ECHOCARDIOGRAM (TEE) (N/A) Mobilize Diuresis Diabetes control d/c tubes/lines See progression orders Continue insulin Expected Acute  Blood - loss Anemia- continue to monitor    Grace Isaac 10/20/2017 7:18 AM

## 2017-10-20 NOTE — Op Note (Signed)
NAME:  NOVICE, VRBA                   ACCOUNT NO.:  MEDICAL RECORD NO.:  [id number]  LOCATION:                                 FACILITY:  PHYSICIAN:  Lanelle Bal, MD    DATE OF BIRTH:  Jun 02, 1961  DATE OF PROCEDURE:  10/19/2017 DATE OF DISCHARGE:                              OPERATIVE REPORT   POSTOPERATIVE DIAGNOSIS:  Severe 3-vessel coronary artery disease with admission for acute gallstone pancreatitis and mildly elevated troponin.  POSTOPERATIVE DIAGNOSIS:  Severe 3-vessel coronary artery disease with admission for acute gallstone pancreatitis and mildly elevated troponin.  SURGICAL PROCEDURES:  Coronary artery bypass grafting x4 with left internal mammary to the left anterior descending coronary artery, reversed saphenous vein graft to the first diagonal coronary artery, reversed saphenous vein graft to the obtuse marginal coronary artery, reversed saphenous vein graft to the distal right coronary artery with right greater saphenous endoscopic vein harvesting from the thigh and calf.  SURGEON:  Lanelle Bal, MD.  FIRST ASSISTANT:  Ellwood Handler, PA.  BRIEF HISTORY:  The patient is a 57 year old diabetic female with a long smoking history, who had known coronary artery occlusive disease, having stents placed in the right coronary artery while living in [place] in 2009.  Previously, she had been on Plavix, but not currently.  She began having epigastric pain and was admitted for gallstone pancreatitis and was being considered for a cholecystectomy.  Her initial troponin was 0.18, mildly elevated and for this reason, Cardiology consult was obtained prior to cholecystectomy and cardiac catheterization performed which demonstrated severe 3-vessel coronary artery disease with restenosis and progression of disease in the right coronary artery.  In addition, she had a high-grade proximal LAD lesion, diagonal lesion, a small circumflex system with 70% stenosis.  Overall,  ventricular function was mildly depressed with anterior apical hypokinesis.  The patient was evaluated for coronary artery bypass grafting prior to consideration of cholecystectomy.  Amylase and lipase had returned to normal and the patient was taking a p.o. diet without symptoms of abdominal pain.  Risks and options of surgery were discussed with her and she was agreeable and signed informed consent.  DESCRIPTION OF PROCEDURE:  With Swan-Ganz and arterial line monitors in place, the patient underwent general endotracheal anesthesia without incident.  Skin of the chest and legs was prepped with Betadine and draped in usual sterile manner.  Appropriate time-out was performed.  We then proceeded with right greater saphenous thigh and calf endo vein harvesting.  The vein was slightly small, but otherwise adequate for bypass.  Median sternotomy was performed.  Left internal mammary artery was dissected down as a pedicle graft.  The distal artery was divided and had good free flow.  The pericardium was opened.  Overall ventricular function appeared preserved and this was confirmed with intraoperative TEE.  She was systemically heparinized.  Ascending aorta was cannulated.  The right atrium was cannulated with a dual-stage venous cannula.  An aortic root vent cardioplegia needle was introduced into the ascending aorta.  The patient was placed on cardiopulmonary bypass at 2.4 L/min/m2.  Sites of anastomosis were selected and dissected out of the epicardium.  The patient's body  temperature was cooled to 32 degrees.  Aortic crossclamp was applied and 500 mL of cold blood potassium cardioplegia was administered with diastolic arrest of the heart.  Myocardial septal temperature was monitored throughout the cross-clamp.  I turned our attention first to the very distal right coronary artery after the takeoff of a small posterior descending branch.  The distal branches of the right were relatively  small.  The vessel was somewhat thickened.  A 1.5-mm probe passed distally easily, but would not pass proximally where the known high-grade lesion was. Using a running 7-0 Prolene, a distal anastomosis was performed with a segment of reversed saphenous vein graft.  Attention was then turned to a small obtuse marginal branch which was partially intramyocardial.  The vessel was opened, admitted a 1-mm probe distally.  Using a running 7-0 Prolene, distal anastomosis was performed with a segment of reversed saphenous vein graft.  The first diagonal branch was relatively small. The vessel was opened and admitted a 1-mm probe.  Using a running 7-0 Prolene, distal anastomosis was performed with a segment of reversed saphenous vein graft.  In the midportion of the left anterior descending coronary artery, the vessel was opened and admitted a 1.5-mm probe proximally and distally.  Using a running 8-0 Prolene, the left internal mammary artery was anastomosed to the left anterior descending coronary artery.  With crossclamp still in place, 3 punch aortotomies were performed and each of the 3 vein grafts were anastomosed to the ascending aorta.  The bulldog on the mammary artery was removed with prompt rise in myocardial septal temperature.  The heart was allowed to passively fill and de-air and the proximal anastomosis completed and the aortic cross-clamp was removed with a cross-clamp time of 90 minutes. The patient required electrical defibrillation and returned to a sinus rhythm.  Sites of anastomosis were inspected and free of bleeding.  The patient was then ventilated with body temperature rewarmed to 37 degrees.  She was atrially paced to increased rate and weaned from cardiopulmonary bypass without pressors with a total pump time of 121 minutes.  The patient remained hemodynamically stable.  TEE showed good LV function.  The patient was decannulated in usual fashion.  Protamine sulfate was  administered with operative field.  A left pleural tube and a Blake mediastinal drain were left in place.  Pericardium was loosely reapproximated.  The sternum was closed with #6 stainless steel wire. Fascia was closed with interrupted 0 Vicryl, running 3-0 Vicryl in subcutaneous tissue, 4-0 subcuticular stitch in skin edges.  Dry dressings were applied.  Sponge and needle count was reported as correct at the completion of procedure.  The patient tolerated the procedure without obvious complication and was transferred to Surgical Intensive Care Unit for further postoperative care.  The patient did not require any blood bank blood products during the procedure.     Lanelle Bal, MD     EG/MEDQ  D:  10/20/2017  T:  10/20/2017  Job:  [id number]  cc:   Mali Hilty, MD

## 2017-10-21 ENCOUNTER — Inpatient Hospital Stay (HOSPITAL_COMMUNITY): Payer: BLUE CROSS/BLUE SHIELD

## 2017-10-21 DIAGNOSIS — I214 Non-ST elevation (NSTEMI) myocardial infarction: Secondary | ICD-10-CM

## 2017-10-21 LAB — TYPE AND SCREEN
ABO/RH(D): A POS
Antibody Screen: NEGATIVE
Unit division: 0
Unit division: 0
Unit division: 0
Unit division: 0

## 2017-10-21 LAB — CBC
HCT: 24 % — ABNORMAL LOW (ref 36.0–46.0)
Hemoglobin: 7.4 g/dL — ABNORMAL LOW (ref 12.0–15.0)
MCH: 26.3 pg (ref 26.0–34.0)
MCHC: 30.8 g/dL (ref 30.0–36.0)
MCV: 85.4 fL (ref 78.0–100.0)
Platelets: 136 10*3/uL — ABNORMAL LOW (ref 150–400)
RBC: 2.81 MIL/uL — ABNORMAL LOW (ref 3.87–5.11)
RDW: 19.2 % — ABNORMAL HIGH (ref 11.5–15.5)
WBC: 6.7 10*3/uL (ref 4.0–10.5)

## 2017-10-21 LAB — BASIC METABOLIC PANEL
Anion gap: 8 (ref 5–15)
BUN: 13 mg/dL (ref 6–20)
CO2: 23 mmol/L (ref 22–32)
Calcium: 8.6 mg/dL — ABNORMAL LOW (ref 8.9–10.3)
Chloride: 103 mmol/L (ref 101–111)
Creatinine, Ser: 0.93 mg/dL (ref 0.44–1.00)
GFR calc Af Amer: >60 mL/min (ref >60)
GFR calc non Af Amer: >60 mL/min (ref >60)
Glucose, Bld: 142 mg/dL — ABNORMAL HIGH (ref 65–99)
Potassium: 4.2 mmol/L (ref 3.5–5.1)
Sodium: 134 mmol/L — ABNORMAL LOW (ref 135–145)

## 2017-10-21 LAB — GLUCOSE, CAPILLARY
Glucose-Capillary: 109 mg/dL — ABNORMAL HIGH (ref 65–99)
Glucose-Capillary: 129 mg/dL — ABNORMAL HIGH (ref 65–99)
Glucose-Capillary: 139 mg/dL — ABNORMAL HIGH (ref 65–99)
Glucose-Capillary: 144 mg/dL — ABNORMAL HIGH (ref 65–99)
Glucose-Capillary: 161 mg/dL — ABNORMAL HIGH (ref 65–99)
Glucose-Capillary: 87 mg/dL (ref 65–99)
Glucose-Capillary: 88 mg/dL (ref 65–99)

## 2017-10-21 LAB — BPAM RBC
Blood Product Expiration Date: 201902132359
Blood Product Expiration Date: 201902132359
Blood Product Expiration Date: 201902142359
Blood Product Expiration Date: 201902142359
ISSUE DATE / TIME: 201901280752
ISSUE DATE / TIME: 201901280752
Unit Type and Rh: 6200
Unit Type and Rh: 6200
Unit Type and Rh: 6200
Unit Type and Rh: 6200

## 2017-10-21 MED ORDER — METOCLOPRAMIDE HCL 5 MG/ML IJ SOLN
5.0000 mg | Freq: Three times a day (TID) | INTRAMUSCULAR | Status: AC
  Administered 2017-10-21 (×3): 5 mg via INTRAVENOUS
  Filled 2017-10-21 (×3): qty 2

## 2017-10-21 NOTE — Plan of Care (Signed)
  Activity: Risk for activity intolerance will decrease 10/21/2017 0156 - Not Progressing by Tish Frederickson, RN   Cardiac: Hemodynamic stability will improve 10/21/2017 0156 - Completed/Met by Tish Frederickson, RN   Respiratory: Respiratory status will improve 10/21/2017 0156 - Progressing by Tish Frederickson, RN

## 2017-10-21 NOTE — Progress Notes (Addendum)
[address]       [place] [address]             [phone]      2 Days Post-Op Procedure(s) (LRB): CORONARY ARTERY BYPASS GRAFTING (CABG) times four using the right saphaneous vein. Harvested endoscopicly and left internal mammary artery. (N/A) TRANSESOPHAGEAL ECHOCARDIOGRAM (TEE) (N/A) Subjective: No issues overnight. She still is not eating anything. She plans to may walk later today.   Objective: Vital signs in last 24 hours: Temp:  [97.9 F (36.6 C)-98.6 F (37 C)] 98.6 F (37 C) (01/30 0745) Cardiac Rhythm: Normal sinus rhythm (01/30 0000) Resp:  [12-27] 16 (01/30 0700) BP: (93-133)/(53-78) 93/65 (01/30 0700) SpO2:  [91 %-100 %] 97 % (01/30 0700) Arterial Line BP: (109-139)/(45-53) 123/46 (01/29 1300) Weight:  [171 lb 8.3 oz (77.8 kg)] 171 lb 8.3 oz (77.8 kg) (01/30 0725)  Hemodynamic parameters for last 24 hours: PAP: (18)/(10) 18/10  Intake/Output from previous day: 01/29 0701 - 01/30 0700 In: 875.2 [P.O.:240; I.V.:485.2; IV Piggyback:150] Out: 975 [Urine:875; Chest Tube:100] Intake/Output this shift: Total I/O In: -  Out: 130 [Urine:130]  General appearance: alert, cooperative and no distress Heart: regular rate and rhythm, S1, S2 normal, no murmur, click, rub or gallop Lungs: clear to auscultation bilaterally and diminshed in the lower lobes Abdomen: soft, non-tender; bowel sounds normal; no masses,  no organomegaly Extremities: extremities normal, atraumatic, no cyanosis or edema Wound: clean and dry dressed with sterile dressing  Lab Results: Recent Labs    10/20/17 1640 10/20/17 1653 10/21/17 0441  WBC 7.4  --  6.7  HGB 7.9* 8.5* 7.4*  HCT 25.5* 25.0* 24.0*  PLT 121*  --  136*   BMET:  Recent Labs    10/20/17 0414  10/20/17 1653 10/21/17 0441  NA 136  --  136 134*  K 4.0  --  4.2 4.2  CL 109  --  103 103  CO2 22  --   --  23  GLUCOSE 125*  --  167* 142*  BUN 15  --  14 13  CREATININE 0.77   < > 0.70 0.93    CALCIUM 8.1*  --   --  8.6*   < > = values in this interval not displayed.    PT/INR:  Recent Labs    10/19/17 1436  LABPROT 15.5*  INR 1.24   ABG    Component Value Date/Time   PHART 7.308 (L) 10/19/2017 1902   HCO3 23.3 10/19/2017 1902   TCO2 22 10/20/2017 1653   ACIDBASEDEF 3.0 (H) 10/19/2017 1902   O2SAT 98.0 10/19/2017 1902   CBG (last 3)  Recent Labs    10/20/17 2356 10/21/17 0348 10/21/17 0731  GLUCAP 161* 144* 139*    Assessment/Plan: S/P Procedure(s) (LRB): CORONARY ARTERY BYPASS GRAFTING (CABG) times four using the right saphaneous vein. Harvested endoscopicly and left internal mammary artery. (N/A) TRANSESOPHAGEAL ECHOCARDIOGRAM (TEE) (N/A)  1. CV-NSR rate in the 70s. BP stable but low at times. Not on pressors. Tolerating ASA, Lipitor, and Lopressor.  2. Pulm-tolerating 2L [place] with good oxygen saturation. Continue to wean as tolerated. CXR stable, there may still be a small left apical pneumothorax, Will await official read. + air leak  3. Renal-creatinine 0.93. Electrolytes okay. Remains fluid overloaded.  4. H and H dropped to 7.4/24.0, platelets continue to trend up. 5. Endo-blood glucose level well controlled on current regimen.  Plan: Change atrium to see where the leak is coming  from. Reglan x 3. Advance diet as tolerated. Ambulate TID today. D/c foley catheter.     LOS: 11 days    Victoria Eaton 10/21/2017  Slow progress , not walking much Air leak from atrium system on changing it, d/c chest tube  I have seen and examined [name] and agree with the above assessment  and plan.  Grace Isaac MD [facility] [phone] Office [phone] 10/21/2017 9:28 AM

## 2017-10-21 NOTE — Progress Notes (Signed)
Patient refused ambulation this morning but has agreed to walk after breakfast. Will encourage patient to participate in therapy

## 2017-10-21 NOTE — Progress Notes (Signed)
CT surgery p.m. Rounds  Recovering well from CABG 2 days ago Walk up in hallway Sinus rhythm 72/m Oxygen saturation 100% on 2 L P.m. Labs satisfactory

## 2017-10-21 NOTE — Progress Notes (Signed)
Progress Note  Patient Name: Victoria Eaton Date of Encounter: 10/21/2017  Primary Cardiologist: Pixie Casino, MD   Subjective   Feeling better. No dyspnea.   Inpatient Medications    Scheduled Meds: . acetaminophen  1,000 mg Oral Q6H   Or  . acetaminophen (TYLENOL) oral liquid 160 mg/5 mL  1,000 mg Per Tube Q6H  . aspirin EC  325 mg Oral Daily   Or  . aspirin  324 mg Per Tube Daily  . atorvastatin  80 mg Oral q1800  . bisacodyl  10 mg Oral Daily   Or  . bisacodyl  10 mg Rectal Daily  . docusate sodium  200 mg Oral Daily  . enoxaparin (LOVENOX) injection  30 mg Subcutaneous QHS  . insulin aspart  0-24 Units Subcutaneous Q4H  . insulin detemir  12 Units Subcutaneous Daily  . metoCLOPramide (REGLAN) injection  5 mg Intravenous Q8H  . metoprolol tartrate  12.5 mg Oral BID   Or  . metoprolol tartrate  12.5 mg Per Tube BID  . pantoprazole  40 mg Oral Daily  . sodium chloride flush  3 mL Intravenous Q12H   Continuous Infusions: . sodium chloride Stopped (10/20/17 1000)  . sodium chloride Stopped (10/20/17 1000)  . sodium chloride Stopped (10/20/17 1000)  . dexmedetomidine (PRECEDEX) IV infusion Stopped (10/19/17 1708)  . lactated ringers    . lactated ringers Stopped (10/20/17 1000)  . lactated ringers 20 mL/hr at 10/21/17 0900  . nitroGLYCERIN Stopped (10/19/17 1409)  . phenylephrine (NEO-SYNEPHRINE) Adult infusion Stopped (10/20/17 1200)   PRN Meds: sodium chloride, lactated ringers, metoprolol tartrate, morphine injection, ondansetron (ZOFRAN) IV, oxyCODONE, sodium chloride flush, traMADol   Vital Signs    Vitals:   10/21/17 0745 10/21/17 0800 10/21/17 0900 10/21/17 0930  BP:  111/62 102/60   Pulse:      Resp:  19 15 19   Temp: 98.6 F (37 C)     TempSrc: Oral     SpO2:  92% 95% 97%  Weight:      Height:        Intake/Output Summary (Last 24 hours) at 10/21/2017 1007 Last data filed at 10/21/2017 0900 Gross per 24 hour  Intake 724.4 ml  Output 985  ml  Net -260.6 ml   Filed Weights   10/19/17 0454 10/20/17 0500 10/21/17 0725  Weight: 149 lb 3.2 oz (67.7 kg) 164 lb 10.9 oz (74.7 kg) 171 lb 8.3 oz (77.8 kg)    Telemetry    sinus- Personally Reviewed  ECG    No am EKG- Personally Reviewed  Physical Exam    General: Well developed, well nourished, NAD  HEENT: OP clear, mucus membranes moist  SKIN: warm, dry. No rashes. Neuro: No focal deficits  Musculoskeletal: Muscle strength 5/5 all ext  Psychiatric: Mood and affect normal  Neck: No JVD, no carotid bruits, no thyromegaly, no lymphadenopathy.  Lungs:Clear bilaterally, no wheezes, rhonci, crackles Cardiovascular: Regular rate and rhythm. No murmurs, gallops or rubs. Abdomen:Soft. Bowel sounds present. Non-tender.  Extremities: No lower extremity edema. Pulses are 2 + in the bilateral DP/PT.   Labs    Chemistry Recent Labs  Lab 10/15/17 0242 10/16/17 0229 10/18/17 2328  10/20/17 0414 10/20/17 1640 10/20/17 1653 10/21/17 0441  NA 137 138 132*   < > 136  --  136 134*  K 3.9 4.8 4.3   < > 4.0  --  4.2 4.2  CL 104 105 99*   < > 109  --  103 103  CO2 23 24 24   --  22  --   --  23  GLUCOSE 156* 183* 326*   < > 125*  --  167* 142*  BUN 9 9 22*   < > 15  --  14 13  CREATININE 0.97 1.10* 1.14*   < > 0.77 0.83 0.70 0.93  CALCIUM 9.0 9.3 9.1  --  8.1*  --   --  8.6*  PROT 6.6 7.1  --   --   --   --   --   --   ALBUMIN 2.8* 3.0*  --   --   --   --   --   --   AST 40 46*  --   --   --   --   --   --   ALT 43 50  --   --   --   --   --   --   ALKPHOS 131* 125  --   --   --   --   --   --   BILITOT 0.5 0.6  --   --   --   --   --   --   GFRNONAA >60 54* 52*   < > >60 >60  --  >60  GFRAA >60 >60 >60   < > >60 >60  --  >60  ANIONGAP 10 9 9   --  5  --   --  8   < > = values in this interval not displayed.     Hematology Recent Labs  Lab 10/20/17 0414 10/20/17 1640 10/20/17 1653 10/21/17 0441  WBC 7.4 7.4  --  6.7  RBC 2.92* 2.98*  --  2.81*  HGB 7.8* 7.9* 8.5*  7.4*  HCT 24.6* 25.5* 25.0* 24.0*  MCV 84.2 85.6  --  85.4  MCH 26.7 26.5  --  26.3  MCHC 31.7 31.0  --  30.8  RDW 19.0* 19.1*  --  19.2*  PLT 163 121*  --  136*    Cardiac EnzymesNo results for input(s): TROPONINI in the last 168 hours. No results for input(s): TROPIPOC in the last 168 hours.   BNPNo results for input(s): BNP, PROBNP in the last 168 hours.   DDimer No results for input(s): DDIMER in the last 168 hours.   Radiology    Dg Chest Port 1 View  Result Date: 10/21/2017 CLINICAL DATA:  Postop.  Coronary artery bypass graft on 10/11/2017 EXAM: PORTABLE CHEST 1 VIEW COMPARISON:  10/20/2017 FINDINGS: Swan-Ganz catheter removed with right IJ Cordis sheath remaining in place. Left chest tube remains in place. Removal of a more medial left chest tube versus mediastinal drain. Midline trachea. Moderate cardiomegaly. Persistent small left pleural effusion. Less than 5% left apical pneumothorax is slightly decreased. Mild interstitial edema is improved. Persistent left worse than right base airspace disease. IMPRESSION: Decrease in less than 5% left apical pneumothorax. Decreased pulmonary edema, with persistent left pleural effusion, and bibasilar airspace disease. Electronically Signed   By: Abigail Miyamoto M.D.   On: 10/21/2017 09:48   Dg Chest Port 1 View  Result Date: 10/20/2017 CLINICAL DATA:  Prior CABG.  Follow-up exam. EXAM: PORTABLE CHEST 1 VIEW COMPARISON:  10/19/2017. FINDINGS: Interim extubation removal of NG tube. Swan-Ganz catheter and mediastinal drainage catheter stable position. Left chest tube in stable position. Tiny left pneumothorax noted on today's exam. Prior CABG. Cardiomegaly with slight increase in basilar interstitial prominence. Mild CHF cannot be  excluded. Small left pleural effusion. IMPRESSION: 1. Interim extubation removal of NG tube. Swan-Ganz catheter mediastinal drainage catheter stable position. Left chest tube in stable position. There is a tiny left  pneumothorax on today's exam. 2. Prior CABG. Cardiomegaly with slight increase in basilar interstitial prominence and small left pleural effusion. Mild CHF cannot be excluded. Critical Value/emergent results were called by telephone at the time of interpretation on 10/20/2017 at 8:45 am to nurse [place], who verbally acknowledged these results. Electronically Signed   By: Marcello Moores  Register   On: 10/20/2017 08:49   Dg Chest Port 1 View  Result Date: 10/19/2017 CLINICAL DATA:  Status post CABG. EXAM: PORTABLE CHEST 1 VIEW COMPARISON:  10/10/2017. FINDINGS: Patient is status post CABG. The heart is enlarged. LEFT chest tube good position. Orogastric tube in stomach. ETT 2.7 cm above carina. Swan-Ganz catheter tip outflow tract of the RIGHT ventricle. No pneumothorax or consolidation. Mediastinal tube overlies the midline. IMPRESSION: Satisfactory postoperative chest. Electronically Signed   By: Staci Righter M.D.   On: 10/19/2017 14:49    Cardiac Studies   Cardiac cath 10/14/17:  Prox RCA lesion is 80% stenosed.  Mid RCA to Dist RCA lesion is 70% stenosed.  Dist RCA lesion is 70% stenosed.  Prox LAD to Mid LAD lesion is 90% stenosed.  Mid LAD to Dist LAD lesion is 85% stenosed.  Ost 1st Diag lesion is 80% stenosed.  Lat 1st Diag lesion is 90% stenosed.  1st Diag lesion is 90% stenosed.  Ost 1st Mrg to 1st Mrg lesion is 90% stenosed.  There is moderate left ventricular systolic dysfunction.  LV end diastolic pressure is mildly elevated.  The left ventricular ejection fraction is 35-45% by visual estimate.     Patient Profile     57 y.o. female with CAD/UA now s/p CABG on 10/19/17  Assessment & Plan    1. CAD/NSTEMI: Pt admitted with unstable angina. Cardiac cath with severe multivessel CAD. She is now two days post CABG and doing well overall. She is in sinus. BP is stable.  Continue ASA, statin and beta blocker.   For questions or updates, please contact [facility] Please  consult [email or web address] for contact info under Cardiology/STEMI.      Signed, Lauree Chandler, MD  10/21/2017, 10:07 AM

## 2017-10-22 ENCOUNTER — Inpatient Hospital Stay (HOSPITAL_COMMUNITY): Payer: BLUE CROSS/BLUE SHIELD

## 2017-10-22 LAB — GLUCOSE, CAPILLARY
Glucose-Capillary: 112 mg/dL — ABNORMAL HIGH (ref 65–99)
Glucose-Capillary: 113 mg/dL — ABNORMAL HIGH (ref 65–99)
Glucose-Capillary: 120 mg/dL — ABNORMAL HIGH (ref 65–99)
Glucose-Capillary: 129 mg/dL — ABNORMAL HIGH (ref 65–99)
Glucose-Capillary: 195 mg/dL — ABNORMAL HIGH (ref 65–99)
Glucose-Capillary: 66 mg/dL (ref 65–99)
Glucose-Capillary: 98 mg/dL (ref 65–99)

## 2017-10-22 MED ORDER — SERTRALINE HCL 50 MG PO TABS
50.0000 mg | ORAL_TABLET | Freq: Every day | ORAL | Status: DC
  Administered 2017-10-22 – 2017-10-25 (×4): 50 mg via ORAL
  Filled 2017-10-22 (×4): qty 1

## 2017-10-22 MED ORDER — BUPROPION HCL ER (XL) 150 MG PO TB24
150.0000 mg | ORAL_TABLET | Freq: Every day | ORAL | Status: DC
  Administered 2017-10-22 – 2017-10-25 (×4): 150 mg via ORAL
  Filled 2017-10-22 (×5): qty 1

## 2017-10-22 MED ORDER — ASPIRIN EC 81 MG PO TBEC
81.0000 mg | DELAYED_RELEASE_TABLET | Freq: Every day | ORAL | Status: DC
  Administered 2017-10-22 – 2017-10-25 (×4): 81 mg via ORAL
  Filled 2017-10-22 (×4): qty 1

## 2017-10-22 MED ORDER — ONDANSETRON HCL 4 MG/2ML IJ SOLN: 4.0000 mg | Freq: Four times a day (QID) | INTRAMUSCULAR | Status: DC | PRN

## 2017-10-22 MED ORDER — ONDANSETRON HCL 4 MG PO TABS
4.0000 mg | ORAL_TABLET | Freq: Four times a day (QID) | ORAL | Status: DC | PRN
Start: 2017-10-22 — End: 2017-10-25

## 2017-10-22 MED ORDER — BISACODYL 10 MG RE SUPP: 10.0000 mg | Freq: Every day | RECTAL | Status: DC | PRN

## 2017-10-22 MED ORDER — SODIUM CHLORIDE 0.9 % IV SOLN: 250.0000 mL | INTRAVENOUS | Status: DC | PRN

## 2017-10-22 MED ORDER — ALUM & MAG HYDROXIDE-SIMETH 200-200-20 MG/5ML PO SUSP
15.0000 mL | ORAL | Status: DC | PRN
Start: 2017-10-22 — End: 2017-10-25

## 2017-10-22 MED ORDER — ACETAMINOPHEN 325 MG PO TABS
650.0000 mg | ORAL_TABLET | Freq: Four times a day (QID) | ORAL | Status: DC | PRN
  Administered 2017-10-22: 650 mg via ORAL
  Filled 2017-10-22: qty 2

## 2017-10-22 MED ORDER — GUAIFENESIN ER 600 MG PO TB12: 600.0000 mg | ORAL_TABLET | Freq: Two times a day (BID) | ORAL | Status: DC | PRN

## 2017-10-22 MED ORDER — GLIPIZIDE 10 MG PO TABS
10.0000 mg | ORAL_TABLET | Freq: Every day | ORAL | Status: DC
Start: 2017-10-22 — End: 2017-10-25
  Administered 2017-10-22 – 2017-10-25 (×4): 10 mg via ORAL
  Filled 2017-10-22 (×4): qty 1

## 2017-10-22 MED ORDER — PANTOPRAZOLE SODIUM 40 MG PO TBEC
40.0000 mg | DELAYED_RELEASE_TABLET | Freq: Every day | ORAL | Status: DC
  Administered 2017-10-22 – 2017-10-25 (×4): 40 mg via ORAL
  Filled 2017-10-22 (×3): qty 1

## 2017-10-22 MED ORDER — SODIUM CHLORIDE 0.9% FLUSH
3.0000 mL | Freq: Two times a day (BID) | INTRAVENOUS | Status: DC
  Administered 2017-10-22 – 2017-10-24 (×4): 3 mL via INTRAVENOUS

## 2017-10-22 MED ORDER — METOPROLOL TARTRATE 12.5 MG HALF TABLET
12.5000 mg | ORAL_TABLET | Freq: Two times a day (BID) | ORAL | Status: DC
  Administered 2017-10-22 (×2): 12.5 mg via ORAL
  Filled 2017-10-22: qty 1

## 2017-10-22 MED ORDER — MAGNESIUM HYDROXIDE 400 MG/5ML PO SUSP: 30.0000 mL | Freq: Every day | ORAL | Status: DC | PRN

## 2017-10-22 MED ORDER — SODIUM CHLORIDE 0.9% FLUSH: 3.0000 mL | INTRAVENOUS | Status: DC | PRN

## 2017-10-22 MED ORDER — TRAMADOL HCL 50 MG PO TABS
50.0000 mg | ORAL_TABLET | ORAL | Status: DC | PRN
  Administered 2017-10-23: 50 mg via ORAL
  Administered 2017-10-24: 100 mg via ORAL
  Filled 2017-10-22: qty 2
  Filled 2017-10-22: qty 1

## 2017-10-22 MED ORDER — BISACODYL 5 MG PO TBEC: 10.0000 mg | DELAYED_RELEASE_TABLET | Freq: Every day | ORAL | Status: DC | PRN

## 2017-10-22 MED ORDER — INSULIN ASPART 100 UNIT/ML ~~LOC~~ SOLN
0.0000 [IU] | Freq: Three times a day (TID) | SUBCUTANEOUS | Status: DC
  Administered 2017-10-22 – 2017-10-23 (×2): 4 [IU] via SUBCUTANEOUS
  Administered 2017-10-23: 12 [IU] via SUBCUTANEOUS
  Administered 2017-10-23 (×2): 2 [IU] via SUBCUTANEOUS
  Administered 2017-10-24: 4 [IU] via SUBCUTANEOUS
  Administered 2017-10-24: 2 [IU] via SUBCUTANEOUS
  Administered 2017-10-24: 8 [IU] via SUBCUTANEOUS
  Administered 2017-10-25: 4 [IU] via SUBCUTANEOUS

## 2017-10-22 MED ORDER — MOVING RIGHT ALONG BOOK
Freq: Once | Status: AC
  Administered 2017-10-22: 09:00:00
  Filled 2017-10-22: qty 1

## 2017-10-22 MED ORDER — OXYCODONE HCL 5 MG PO TABS
5.0000 mg | ORAL_TABLET | ORAL | Status: DC | PRN
  Administered 2017-10-22 – 2017-10-23 (×4): 10 mg via ORAL
  Filled 2017-10-22 (×4): qty 2

## 2017-10-22 MED ORDER — LINAGLIPTIN 5 MG PO TABS
5.0000 mg | ORAL_TABLET | Freq: Every day | ORAL | Status: DC
  Administered 2017-10-22 – 2017-10-25 (×4): 5 mg via ORAL
  Filled 2017-10-22 (×4): qty 1

## 2017-10-22 MED FILL — Heparin Sodium (Porcine) Inj 1000 Unit/ML: INTRAMUSCULAR | Qty: 30 | Status: AC

## 2017-10-22 MED FILL — Sodium Chloride IV Soln 0.9%: INTRAVENOUS | Qty: 2000 | Status: AC

## 2017-10-22 MED FILL — Potassium Chloride Inj 2 mEq/ML: INTRAVENOUS | Qty: 40 | Status: AC

## 2017-10-22 MED FILL — Lidocaine HCl IV Inj 20 MG/ML: INTRAVENOUS | Qty: 5 | Status: AC

## 2017-10-22 MED FILL — Electrolyte-R (PH 7.4) Solution: INTRAVENOUS | Qty: 4000 | Status: AC

## 2017-10-22 MED FILL — Magnesium Sulfate Inj 50%: INTRAMUSCULAR | Qty: 10 | Status: AC

## 2017-10-22 MED FILL — Heparin Sodium (Porcine) Inj 1000 Unit/ML: INTRAMUSCULAR | Qty: 20 | Status: AC

## 2017-10-22 MED FILL — Mannitol IV Soln 20%: INTRAVENOUS | Qty: 500 | Status: AC

## 2017-10-22 MED FILL — Sodium Bicarbonate IV Soln 8.4%: INTRAVENOUS | Qty: 50 | Status: AC

## 2017-10-22 NOTE — Progress Notes (Signed)
Patient ID: Victoria Eaton, female   DOB: [birth year], 57 y.o.   MRN: [id number] T[facility] DAILY ICU PROGRESS NOTE                   [address]            [place] [address]          [phone]   3 Days Post-Op Procedure(s) (LRB): CORONARY ARTERY BYPASS GRAFTING (CABG) times four using the right saphaneous vein. Harvested endoscopicly and left internal mammary artery. (N/A) TRANSESOPHAGEAL ECHOCARDIOGRAM (TEE) (N/A)  Total Length of Stay:  LOS: 12 days   Subjective: Patient awake alert nausea is improved.  Ambulating in the hall some  Objective: Vital signs in last 24 hours: Temp:  [97.7 F (36.5 C)-98.8 F (37.1 C)] 97.7 F (36.5 C) (01/31 0300) Cardiac Rhythm: Normal sinus rhythm (01/31 0400) Resp:  [11-26] 26 (01/31 0600) BP: (88-118)/(51-66) 118/56 (01/31 0500) SpO2:  [91 %-100 %] 95 % (01/31 0600)  Filed Weights   10/19/17 0454 10/20/17 0500 10/21/17 0725  Weight: 149 lb 3.2 oz (67.7 kg) 164 lb 10.9 oz (74.7 kg) 171 lb 8.3 oz (77.8 kg)    Weight change:    Hemodynamic parameters for last 24 hours:    Intake/Output from previous day: 01/30 0701 - 01/31 0700 In: 690 [P.O.:510; I.V.:180] Out: 330 [Urine:255; Chest Tube:75]  Intake/Output this shift: No intake/output data recorded.  Current Meds: Scheduled Meds: . acetaminophen  1,000 mg Oral Q6H   Or  . acetaminophen (TYLENOL) oral liquid 160 mg/5 mL  1,000 mg Per Tube Q6H  . aspirin EC  325 mg Oral Daily   Or  . aspirin  324 mg Per Tube Daily  . atorvastatin  80 mg Oral q1800  . bisacodyl  10 mg Oral Daily   Or  . bisacodyl  10 mg Rectal Daily  . docusate sodium  200 mg Oral Daily  . enoxaparin (LOVENOX) injection  30 mg Subcutaneous QHS  . insulin aspart  0-24 Units Subcutaneous Q4H  . insulin detemir  12 Units Subcutaneous Daily  . metoprolol tartrate  12.5 mg Oral BID   Or  . metoprolol tartrate  12.5 mg Per Tube BID  . pantoprazole  40 mg Oral Daily  . sodium chloride flush  3 mL  Intravenous Q12H   Continuous Infusions: . sodium chloride Stopped (10/20/17 1000)  . sodium chloride Stopped (10/20/17 1000)  . sodium chloride Stopped (10/20/17 1000)  . dexmedetomidine (PRECEDEX) IV infusion Stopped (10/19/17 1708)  . lactated ringers    . lactated ringers Stopped (10/20/17 1000)  . lactated ringers Stopped (10/21/17 1200)  . nitroGLYCERIN Stopped (10/19/17 1409)  . phenylephrine (NEO-SYNEPHRINE) Adult infusion Stopped (10/20/17 1200)   PRN Meds:.sodium chloride, lactated ringers, metoprolol tartrate, morphine injection, ondansetron (ZOFRAN) IV, oxyCODONE, sodium chloride flush, traMADol  General appearance: alert, cooperative, appears older than stated age and no distress Neurologic: intact Heart: regular rate and rhythm, S1, S2 normal, no murmur, click, rub or gallop Lungs: clear to auscultation bilaterally Abdomen: soft, non-tender; bowel sounds normal; no masses,  no organomegaly Extremities: extremities normal, atraumatic, no cyanosis or edema and Homans sign is negative, no sign of DVT Wound: Sternum is stable  Lab Results: CBC: Recent Labs    10/20/17 1640 10/20/17 1653 10/21/17 0441  WBC 7.4  --  6.7  HGB 7.9* 8.5* 7.4*  HCT 25.5* 25.0* 24.0*  PLT 121*  --  136*   BMET:  Recent Labs  10/20/17 0414  10/20/17 1653 10/21/17 0441  NA 136  --  136 134*  K 4.0  --  4.2 4.2  CL 109  --  103 103  CO2 22  --   --  23  GLUCOSE 125*  --  167* 142*  BUN 15  --  14 13  CREATININE 0.77   < > 0.70 0.93  CALCIUM 8.1*  --   --  8.6*   < > = values in this interval not displayed.    CMET: Lab Results  Component Value Date   WBC 6.7 10/21/2017   HGB 7.4 (L) 10/21/2017   HCT 24.0 (L) 10/21/2017   PLT 136 (L) 10/21/2017   GLUCOSE 142 (H) 10/21/2017   CHOL 137 10/10/2017   TRIG 144 10/10/2017   HDL 38 (L) 10/10/2017   LDLCALC 70 10/10/2017   ALT 50 10/16/2017   AST 46 (H) 10/16/2017   NA 134 (L) 10/21/2017   K 4.2 10/21/2017   CL 103  10/21/2017   CREATININE 0.93 10/21/2017   BUN 13 10/21/2017   CO2 23 10/21/2017   INR 1.24 10/19/2017   HGBA1C 8.5 (H) 10/18/2017      PT/INR:  Recent Labs    10/19/17 1436  LABPROT 15.5*  INR 1.24   Radiology: No results found.   Assessment/Plan: S/P Procedure(s) (LRB): CORONARY ARTERY BYPASS GRAFTING (CABG) times four using the right saphaneous vein. Harvested endoscopicly and left internal mammary artery. (N/A) TRANSESOPHAGEAL ECHOCARDIOGRAM (TEE) (N/A) Mobilize Diuresis Diabetes control Plan for transfer to step-down: see transfer orders Expected Acute  Blood - loss Anemia- continue to monitor      Grace Isaac 10/22/2017 7:56 AM

## 2017-10-22 NOTE — Progress Notes (Signed)
Hypoglycemic Event  CBG: 66  Treatment: 15 GM carbohydrate snack  Symptoms: None  Follow-up CBG: 129  Possible Reasons for Event: Inadequate meal intake  Comments/MD notified:Patient was fully alert and oriented, normal vital signs, responded well to juice. Will continue to monitor.    [place]

## 2017-10-22 NOTE — Progress Notes (Signed)
Inpatient Diabetes Program Recommendations  AACE/ADA: New Consensus Statement on Inpatient Glycemic Control (2015)  Target Ranges:  Prepandial:   less than 140 mg/dL      Peak postprandial:   less than 180 mg/dL (1-2 hours)      Critically ill patients:  140 - 180 mg/dL  Results for Victoria Eaton, Victoria Eaton (MRN [id number]) as of 10/22/2017 07:43  Ref. Range 10/21/2017 07:31 10/21/2017 12:03 10/21/2017 15:49 10/21/2017 19:33 10/21/2017 19:59 10/22/2017 00:18 10/22/2017 02:04 10/22/2017 03:47  Glucose-Capillary Latest Ref Range: 65 - 99 mg/dL 139 (H) 129 (H) 109 (H) 87 88 66 129 (H) 113 (H)    Review of Glycemic Control  Diabetes history: DM2 Outpatient Diabetes medications: Farxiga 10 mg daily, Glipizide 10 mg BID, Januvia 100 mg daily Current orders for Inpatient glycemic control: Levemir 12 units daily, Novolog 0-24 units Q4H  Inpatient Diabetes Program Recommendations:  Insulin - Basal: Noted Levemir 12 units was only given once on 10/21/17 at 9:01 am and glucose was down to 66 mg/dl at 00:18 on 10/22/17. Please consider decreasing Levemir to 10 units daily.  Thanks, Barnie Alderman, RN, MSN, CDE Diabetes Coordinator Inpatient Diabetes Program [phone] (Team Pager from 8am to 5pm)

## 2017-10-23 ENCOUNTER — Inpatient Hospital Stay (HOSPITAL_COMMUNITY): Payer: BLUE CROSS/BLUE SHIELD

## 2017-10-23 LAB — BASIC METABOLIC PANEL
Anion gap: 9 (ref 5–15)
BUN: 12 mg/dL (ref 6–20)
CO2: 23 mmol/L (ref 22–32)
Calcium: 8.6 mg/dL — ABNORMAL LOW (ref 8.9–10.3)
Chloride: 103 mmol/L (ref 101–111)
Creatinine, Ser: 0.82 mg/dL (ref 0.44–1.00)
GFR calc Af Amer: >60 mL/min (ref >60)
GFR calc non Af Amer: >60 mL/min (ref >60)
Glucose, Bld: 158 mg/dL — ABNORMAL HIGH (ref 65–99)
Potassium: 3.9 mmol/L (ref 3.5–5.1)
Sodium: 135 mmol/L (ref 135–145)

## 2017-10-23 LAB — CBC
HCT: 24.3 % — ABNORMAL LOW (ref 36.0–46.0)
Hemoglobin: 7.7 g/dL — ABNORMAL LOW (ref 12.0–15.0)
MCH: 26.5 pg (ref 26.0–34.0)
MCHC: 31.7 g/dL (ref 30.0–36.0)
MCV: 83.5 fL (ref 78.0–100.0)
Platelets: 160 10*3/uL (ref 150–400)
RBC: 2.91 MIL/uL — ABNORMAL LOW (ref 3.87–5.11)
RDW: 18.8 % — ABNORMAL HIGH (ref 11.5–15.5)
WBC: 6.1 10*3/uL (ref 4.0–10.5)

## 2017-10-23 LAB — GLUCOSE, CAPILLARY
Glucose-Capillary: 150 mg/dL — ABNORMAL HIGH (ref 65–99)
Glucose-Capillary: 265 mg/dL — ABNORMAL HIGH (ref 65–99)
Glucose-Capillary: 148 mg/dL — ABNORMAL HIGH (ref 65–99)
Glucose-Capillary: 196 mg/dL — ABNORMAL HIGH (ref 65–99)

## 2017-10-23 MED ORDER — OXYCODONE HCL 5 MG PO TABS
5.0000 mg | ORAL_TABLET | ORAL | Status: DC | PRN
  Administered 2017-10-23 – 2017-10-25 (×4): 5 mg via ORAL
  Filled 2017-10-23 (×4): qty 1

## 2017-10-23 MED ORDER — METOPROLOL TARTRATE 25 MG PO TABS
25.0000 mg | ORAL_TABLET | Freq: Two times a day (BID) | ORAL | Status: DC
  Administered 2017-10-23 – 2017-10-25 (×5): 25 mg via ORAL
  Filled 2017-10-23 (×5): qty 1

## 2017-10-23 NOTE — Discharge Summary (Signed)
Physician Discharge Summary  Patient ID: Victoria Eaton MRN: [id number] DOB/AGE: 1961/01/15 57 y.o.  Admit date: 10/10/2017 Discharge date: 10/25/2017  Admission Diagnoses: Patient Active Problem List   Diagnosis Date Noted  . S/P CABG x 4 10/19/2017  . Epigastric pain   . Elevated troponin level   . CAD in native artery   . Pancreatitis 10/10/2017  . Pyelonephritis 10/02/2017  . Hyperlipidemia LDL goal <70 09/14/2017  . Type 2 diabetes mellitus without complication, without long-term current use of insulin ([place]) 05/01/2017  . Essential hypertension 05/01/2017  . Anxiety and depression 05/01/2017  . Tobacco abuse 05/01/2017  . History of MI (myocardial infarction) 05/01/2017  . Allergic rhinitis 05/01/2017  . History of arthritis 05/01/2017  . Bladder prolapse, female, acquired 11/09/2013     Discharge Diagnoses:  Active Problems:   Pancreatitis   Epigastric pain   Elevated troponin level   CAD in native artery   S/P CABG x 4   Discharged Condition: good  Hospital Course:  On 10/20/2017 Victoria Eaton underwent a coronary bypass grafting x3 with Dr. Servando Snare.  She tolerated procedure well and was transferred to the cardiac ICU in stable condition.  Postop day 1 she remains stable.  She was able to walk 160 feet.  Postop day 2 she remained in normal sinus rhythm.  She is tolerating 2 L nasal cannula with good oxygenation.  She did have a small left apical pneumo on chest x-ray.  She did have some acute blood loss anemia which we continue to trend.  We discontinued her Foley catheter.  Postop day 3 she continued to progress.  We initiated a diuretic regimen for fluid overload.  We continued to control her blood glucose level with sliding scale insulin.  We began to mobilize the patient.  We also started her oral home diabetes medications.  Postop day 4 she is transferred to the telemetry unit for continued care.  We increased her metoprolol for better heart rate and blood pressure  control.  We removed her epicardial pacing wires since her rhythm has been stable.  Her hemoglobin was trending up.  Her creatinine remained stable.  She did have a small left pleural effusion on chest x-ray.  He does have some hypertension and is restarted on lisinopril and Norvasc.  Her blood sugars have been under fair control and will be discharged on home meds with the exception of her DPP 4 inhibitors.  This is due to recent pancreatitis.  She understands the need for aggressive nutrition and lifestyle management for her diabetes and is willing to make some changes in carbohydrate/glucose intake.  She will check her blood sugars at home.  At time of discharge the patient is felt to be quite stable.  Consults: None  Significant Diagnostic Studies: CLINICAL DATA:  Postop chest radiograph  EXAM: PORTABLE CHEST 1 VIEW  COMPARISON:  10/21/2017  FINDINGS: Right IJ catheter tip is in the projection of the SVC. The left-sided chest tube is been removed. No significant pneumothorax visualized. Left pleural effusion is again noted and appears unchanged from previous exam. Left lower lobe airspace opacification is unchanged.  IMPRESSION: 1. Removal of left chest tube without significant pneumothorax. 2. Left pleural effusion and left base airspace disease, unchanged.   Electronically Signed   By: Kerby Moors M.D.   On: 10/22/2017 08:51  Treatments:  NAME:  Victoria Eaton                   ACCOUNT NO.:  MEDICAL RECORD NO.:  [id number]  LOCATION:                                 FACILITY:  PHYSICIAN:  Lanelle Bal, MD    DATE OF BIRTH:  11/09/1960  DATE OF PROCEDURE:  10/19/2017 DATE OF DISCHARGE:                              OPERATIVE REPORT   POSTOPERATIVE DIAGNOSIS:  Severe 3-vessel coronary artery disease with admission for acute gallstone pancreatitis and mildly elevated troponin.  POSTOPERATIVE DIAGNOSIS:  Severe 3-vessel coronary artery disease  with admission for acute gallstone pancreatitis and mildly elevated troponin.  SURGICAL PROCEDURES:  Coronary artery bypass grafting x4 with left internal mammary to the left anterior descending coronary artery, reversed saphenous vein graft to the first diagonal coronary artery, reversed saphenous vein graft to the obtuse marginal coronary artery, reversed saphenous vein graft to the distal right coronary artery with right greater saphenous endoscopic vein harvesting from the thigh and calf.  SURGEON:  Lanelle Bal, MD.  FIRST ASSISTANT:  Ellwood Handler, PA.    Discharge Exam: Blood pressure (!) 152/82, pulse 81, temperature 98.1 F (36.7 C), temperature source Oral, resp. rate 14, height 5' 6"  (1.676 m), weight 158 lb 9.6 oz (71.9 kg), SpO2 97 %.     General appearance: alert, cooperative and no distress Heart: regular rate and rhythm Lungs: dim left lower fields Abdomen: benign Extremities: no edema Wound: incis healing well    Disposition: 01-Home or Self Care  Discharge Instructions    Amb Referral to Cardiac Rehabilitation   Complete by:  As directed    Diagnosis:  CABG   CABG X ___:  4   Discharge patient   Complete by:  As directed    Discharge disposition:  01-Home or Self Care   Discharge patient date:  10/25/2017     Allergies as of 10/25/2017      Reactions   Kiwi Extract Hives   Shrimp [shellfish Allergy] Hives   Penicillins    Reaction as a child      Medication List    STOP taking these medications   sitaGLIPtin 100 MG tablet Commonly known as:  JANUVIA     TAKE these medications   amLODipine 5 MG tablet Commonly known as:  NORVASC Take 1 tablet (5 mg total) by mouth daily.   aspirin 81 MG EC tablet Take 1 tablet (81 mg total) by mouth daily. What changed:    medication strength  how much to take  when to take this   atorvastatin 80 MG tablet Commonly known as:  LIPITOR Take 1 tablet (80 mg total) by mouth daily at 6  PM. What changed:    medication strength  how much to take  when to take this   buPROPion 150 MG 24 hr tablet Commonly known as:  WELLBUTRIN XL TAKE ONE TABLET BY MOUTH ONE TIME DAILY   EPINEPHrine 0.3 mg/0.3 mL Soaj injection Commonly known as:  EPIPEN 2-PAK Inject 0.3 mLs (0.3 mg total) into the muscle once as needed. What changed:    when to take this  reasons to take this   FARXIGA 10 MG Tabs tablet Generic drug:  dapagliflozin propanediol TAKE ONE TABLET BY MOUTH ONE TIME DAILY   FISH OIL CONCENTRATE 300 MG Caps Take 1 capsule by mouth daily.  glipiZIDE 10 MG tablet Commonly known as:  GLUCOTROL TAKE ONE TABLET BY MOUTH TWICE A DAY BEFORE A MEAL   lisinopril 20 MG tablet Commonly known as:  PRINIVIL,ZESTRIL Take 1 tablet (20 mg total) by mouth daily. What changed:    medication strength  how much to take   loratadine 10 MG tablet Commonly known as:  CLARITIN Take 1 tablet (10 mg total) by mouth daily.   metoprolol tartrate 25 MG tablet Commonly known as:  LOPRESSOR Take 1 tablet (25 mg total) by mouth 2 (two) times daily.   multivitamin with minerals Tabs tablet Take 1 tablet by mouth daily.   oxyCODONE 5 MG immediate release tablet Commonly known as:  Oxy IR/ROXICODONE Take 1 tablet (5 mg total) by mouth every 6 (six) hours as needed for severe pain.   sertraline 50 MG tablet Commonly known as:  ZOLOFT Take 1 tablet (50 mg total) by mouth daily.      Follow-up [facility], [place]. Schedule an appointment as soon as possible for a visit.   Specialty:  General Surgery Why:  after DC'd from hospital Contact information: [facility] Follow up.   Why:  please call to schedule apt  Contact information: [place] [address] [phone]       [place], Peter M, MD  Follow up.   Specialty:  Cardiology Why:  refer to appointments tab for appointment.  Contact information: [address] [place] [address] [phone]        Grace Isaac, MD Follow up.   Specialty:  Cardiothoracic Surgery Why:  refer to appointments tab for time and date Contact information: [address] [place] [address] [place]. Go on 11/03/2017.   Specialty:  Internal Medicine Why:  follow up appointment made for 9:30 am- if you are unable to keep this appointment please call clinic to reschedule Contact information: [place] [phone]         The patient has been discharged on:   1.Beta Blocker:  Yes [ y  ]                              No   [   ]                              If No, reason:  2.Ace Inhibitor/ARB: Yes [ y  ]                                     No  [    ]                                     If No, reason:  3.Statin:   Yes [ y  ]                  No  [   ]  If No, reason:  4.Ecasa:  Yes  [ y  ]                  No   [   ]                  If No, reason:  Signed: John Giovanni 10/25/2017, 8:55 AM

## 2017-10-23 NOTE — Progress Notes (Signed)
CARDIAC REHAB PHASE I   PRE:  Rate/Rhythm: 78 SR    BP: sitting 131/68    SaO2: 95 RA  MODE:  Ambulation: 280 ft   POST:  Rate/Rhythm: 103 ST    BP: sitting 146/66     SaO2: 96 RA  Slightly unsteady in room without RW. Steadier with RW in hall however she seems "blank" at times. Sts he vision/depth perception is still off. Tired after walk, to recliner. Ed completed with good reception. She is still thinking about quitting smoking. Gave resources. Will refer to [facility] however she might have to do maintenance due to lack of insurance. Encouraged more walking with staff.  [place], ACSM 10/23/2017 3:05 PM

## 2017-10-23 NOTE — Care Management Note (Signed)
Case Management Note  Patient Details  Name: Victoria Eaton MRN: [id number] Date of Birth: 05/14/1961  Subjective/Objective:    From home, pta indep,  S/p cath, shows 3 vessel dz, plan for CABG on Monday.  Her daughter will be with her after surgery to assist her.  She states her [employer] has expired, and she is in the process of applying for new insurance.  She would like to be set up with the [facility] clinic.  She states she was going to [facility] urgent care but would like to get an apt at [facility] clinic if she can.   NCM will also assist her with a Match Letter at discharge if needed. Will need to call and schedule a follow up apt when close to discharge.                   Action/Plan: For CABG on Monday.  Expected Discharge Date:                  Expected Discharge Plan:  Home/Self Care  In-House Referral:     Discharge planning Services  CM Consult, Medication Assistance, [place] Program  Post Acute Care Choice:    Choice offered to:     DME Arranged:    DME Agency:     HH Arranged:    HH Agency:     Status of Service:  In process, will continue to follow  If discussed at Long Length of Stay Meetings, dates discussed:    Discharge Disposition: home/self care   Additional Comments:  10/23/17- 1520- Tala Eber RN CM- spoke with pt at bedside regarding transition needs- pt states she has a PCP but agreeable to one of the [facility]- call made to [facility] today however they do not have any available appointment- call then made to [facility]- who had a f/u appointment for Feb. 12 at 55 am- info given to pt on clinic- discussed DME and other possible needs- pt reports daughter to be with her- no DME needs identified- CM will look at medications prior to discharge to see if pt will need [place] letter.   Dahlia Client [place], RN 10/23/2017, 3:20 PM [phone] Case manager

## 2017-10-23 NOTE — Progress Notes (Signed)
Encouraged patient ambulation. Patient declined at this time. Will monitor patient. Deeanne Deininger, Bettina Gavia RN

## 2017-10-23 NOTE — Progress Notes (Signed)
Per RN shift report this AM patient ambulated in hallway will monitor patient. Cheresa Siers, Bettina Gavia RN

## 2017-10-23 NOTE — Progress Notes (Signed)
Removed epicardial wires per order. 3 intact.  Pt tolerated procedure well.  Pt instructed to remain on bedrest for one hour.  Frequent vitals will be taken and documented. Pt resting with call bell within reach. Payton Emerald, RN

## 2017-10-23 NOTE — Discharge Instructions (Signed)
Coronary Artery Bypass Grafting, Care After   This sheet gives you information about how to care for yourself after your procedure. Your health care provider may also give you more specific instructions. If you have problems or questions, contact your health care provider. What can I expect after the procedure? After the procedure, it is common to have:  Nausea and a lack of appetite.  Constipation.  Weakness and fatigue.  Depression or irritability.  Pain or discomfort in your incision areas.  Follow these instructions at home: Medicines  Take over-the-counter and prescription medicines only as told by your health care provider. Do not stop taking medicines or start any new medicines without approval from your health care provider.  If you were prescribed an antibiotic medicine, take it as told by your health care provider. Do not stop taking the antibiotic even if you start to feel better.  Do not drive or use heavy machinery while taking prescription pain medicine. Incision care  Follow instructions from your health care provider about how to take care of your incisions. Make sure you: ? Wash your hands with soap and water before you change your bandage (dressing). If soap and water are not available, use hand sanitizer. ? Change your dressing as told by your health care provider. ? Leave stitches (sutures), skin glue, or adhesive strips in place. These skin closures may need to stay in place for 2 weeks or longer. If adhesive strip edges start to loosen and curl up, you may trim the loose edges. Do not remove adhesive strips completely unless your health care provider tells you to do that.  Keep incision areas clean, dry, and protected.  Check your incision areas every day for signs of infection. Check for: ? More redness, swelling, or pain. ? More fluid or blood. ? Warmth. ? Pus or a bad smell.  If incisions were made in your legs: ? Avoid crossing your legs. ? Avoid  sitting for long periods of time. Change positions every 30 minutes. ? Raise (elevate) your legs when you are sitting. Bathing  Do not take baths, swim, or use a hot tub until your health care provider approves.  Only take sponge baths. Pat the incisions dry. Do not rub incisions with a washcloth or towel.  Ask your health care provider when you can shower. Eating and drinking  Eat foods that are high in fiber, such as raw fruits and vegetables, whole grains, beans, and nuts. Meats should be lean cut. Avoid canned, processed, and fried foods. This can help prevent constipation and is a recommended part of a heart-healthy diet.  Drink enough fluid to keep your urine clear or pale yellow.  Limit alcohol intake to no more than 1 drink a day for nonpregnant women and 2 drinks a day for men. One drink equals 12 oz of beer, 5 oz of wine, or 1 oz of hard liquor. Activity  Rest and limit your activity as told by your health care provider. You may be instructed to: ? Stop any activity right away if you have chest pain, shortness of breath, irregular heartbeats, or dizziness. Get help right away if you have any of these symptoms. ? Move around frequently for short periods or take short walks as directed by your health care provider. Gradually increase your activities. You may need physical therapy or cardiac rehabilitation to help strengthen your muscles and build your endurance. ? Avoid lifting, pushing, or pulling anything that is heavier than 10 lb (4.5 kg)  for at least 6 weeks or as told by your health care provider.  Do not drive until your health care provider approves.  Ask your health care provider when you may return to work.  Ask your health care provider when you may resume sexual activity. General instructions  Do not use any products that contain nicotine or tobacco, such as cigarettes and e-cigarettes. If you need help quitting, ask your health care provider.  Take 2-3 deep  breaths every few hours during the day, while you recover. This helps expand your lungs and prevent complications like pneumonia after surgery.  If you were given a device called an incentive spirometer, use it several times a day to practice deep breathing. Support your chest with a pillow or your arms when you take deep breaths or cough.  Wear compression stockings as told by your health care provider. These stockings help to prevent blood clots and reduce swelling in your legs.  Weigh yourself every day. This helps identify if your body is holding (retaining) fluid that may make your heart and lungs work harder.  Keep all follow-up visits as told by your health care provider. This is important. Contact a health care provider if:  You have more redness, swelling, or pain around any incision.  You have more fluid or blood coming from any incision.  Any incision feels warm to the touch.  You have pus or a bad smell coming from any incision  You have a fever.  You have swelling in your ankles or legs.  You have pain in your legs.  You gain 2 lb (0.9 kg) or more a day.  You are nauseous or you vomit.  You have diarrhea. Get help right away if:  You have chest pain that spreads to your jaw or arms.  You are short of breath.  You have a fast or irregular heartbeat.  You notice a "clicking" in your breastbone (sternum) when you move.  You have numbness or weakness in your arms or legs.  You feel dizzy or light-headed. Summary  After the procedure, it is common to have pain or discomfort in the incision areas.  Do not take baths, swim, or use a hot tub until your health care provider approves.  Gradually increase your activities. You may need physical therapy or cardiac rehabilitation to help strengthen your muscles and build your endurance.  Weigh yourself every day. This helps identify if your body is holding (retaining) fluid that may make your heart and lungs work  harder. This information is not intended to replace advice given to you by your health care provider. Make sure you discuss any questions you have with your health care provider. Document Released: 03/28/2005 Document Revised: 07/28/2016 Document Reviewed: 07/28/2016 Elsevier Interactive Patient Education  [employer].

## 2017-10-23 NOTE — Progress Notes (Addendum)
[address]       [place] [address]             [phone]      4 Days Post-Op Procedure(s) (LRB): CORONARY ARTERY BYPASS GRAFTING (CABG) times four using the right saphaneous vein. Harvested endoscopicly and left internal mammary artery. (N/A) TRANSESOPHAGEAL ECHOCARDIOGRAM (TEE) (N/A) Subjective: Feels okay this morning. States that she is having trouble with visual depth perception. She says that she feels like the walls are textured like clouds.   Objective: Vital signs in last 24 hours: Temp:  [98.2 F (36.8 C)-98.8 F (37.1 C)] 98.8 F (37.1 C) (02/01 0410) Pulse Rate:  [78-85] 84 (02/01 0410) Cardiac Rhythm: Normal sinus rhythm (01/31 1950) Resp:  [14-22] 18 (02/01 0410) BP: (112-154)/(58-72) 142/70 (02/01 0410) SpO2:  [94 %-98 %] 94 % (02/01 0410) Weight:  [160 lb 12.8 oz (72.9 kg)] 160 lb 12.8 oz (72.9 kg) (02/01 0407)     Intake/Output from previous day: 01/31 0701 - 02/01 0700 In: 825 [P.O.:825] Out: 550 [Urine:550] Intake/Output this shift: Total I/O In: 200 [P.O.:200] Out: 550 [Urine:550]  General appearance: alert, cooperative and no distress Heart: regular rate and rhythm, S1, S2 normal, no murmur, click, rub or gallop Lungs: clear to auscultation bilaterally Abdomen: soft, non-tender; bowel sounds normal; no masses,  no organomegaly Extremities: upper extremity edema Wound: clean and dry  Lab Results: Recent Labs    10/21/17 0441 10/23/17 0231  WBC 6.7 6.1  HGB 7.4* 7.7*  HCT 24.0* 24.3*  PLT 136* 160   BMET:  Recent Labs    10/21/17 0441 10/23/17 0231  NA 134* 135  K 4.2 3.9  CL 103 103  CO2 23 23  GLUCOSE 142* 158*  BUN 13 12  CREATININE 0.93 0.82  CALCIUM 8.6* 8.6*    PT/INR: No results for input(s): LABPROT, INR in the last 72 hours. ABG    Component Value Date/Time   PHART 7.308 (L) 10/19/2017 1902   HCO3 23.3 10/19/2017 1902   TCO2 22 10/20/2017 1653   ACIDBASEDEF 3.0 (H) 10/19/2017 1902   O2SAT 98.0 10/19/2017 1902   CBG (last 3)  Recent Labs    10/22/17 1211 10/22/17 1639 10/22/17 2054  GLUCAP 195* 112* 120*    Assessment/Plan: S/P Procedure(s) (LRB): CORONARY ARTERY BYPASS GRAFTING (CABG) times four using the right saphaneous vein. Harvested endoscopicly and left internal mammary artery. (N/A) TRANSESOPHAGEAL ECHOCARDIOGRAM (TEE) (N/A)  1. CV-NSR in the 80s. BP climbing, will increase Metoprolol to 36m BID. Continue ASA and statin. 2. Pulm-tolerating room air with good oxygen saturation. Encourage incentive spirometer use. CXR this morning showed a small left pleural effusion and bibasilar atelectasis. Await official read.  3. Renal-creatinine 0.82 this morning, electrolytes okay.  4. H and H stable, expected acute blood loss anemia. 7.7/24.3 today up from 7.4/24.0 5. Endo-overall good control. Continue Glipizide, Tradjenta, and SSI 6. Anxiety/Depression-on home medications Zoloft and Wellbutrin XL 7. Visual changes-try to use Tramadol for pain medication. No sign of stroke. No sudden loss of vision.    Plan: Epicardial pacing out today rhythm has been stable. Ambulate TID. Continue to use incentive spirometer. Possibly home this weekend if she continues to progress.     LOS: 13 days    TElgie Collard2/09/2017  Overall more alert and talkative now , wants to bring pet rabbits to the hospital  Poss home Sunday I have seen and examined CArtist Paisand agree with the above assessment  and plan.  Grace Isaac MD Bee[place] [phone] Office [phone] 10/23/2017 12:19 PM

## 2017-10-24 ENCOUNTER — Other Ambulatory Visit: Payer: Self-pay | Admitting: Physician Assistant

## 2017-10-24 DIAGNOSIS — I10 Essential (primary) hypertension: Secondary | ICD-10-CM

## 2017-10-24 DIAGNOSIS — I252 Old myocardial infarction: Secondary | ICD-10-CM

## 2017-10-24 LAB — GLUCOSE, CAPILLARY
Glucose-Capillary: 214 mg/dL — ABNORMAL HIGH (ref 65–99)
Glucose-Capillary: 182 mg/dL — ABNORMAL HIGH (ref 65–99)
Glucose-Capillary: 113 mg/dL — ABNORMAL HIGH (ref 65–99)
Glucose-Capillary: 142 mg/dL — ABNORMAL HIGH (ref 65–99)

## 2017-10-24 NOTE — Progress Notes (Signed)
CARDIAC REHAB PHASE I   PRE:  Rate/Rhythm: 81 SR  BP:  Sitting: 149/78      SaO2: 94%  MODE:  Ambulation: 470 ft   POST:  Rate/Rhythm: 98 SR  BP:  Sitting: 154/88      SaO2: 98%  Pt in bed. Pt used restroom prior to walk.Pt ambulated 470 ft with RW. Pt had steady gait. Pt denied any complaints of CP, SOB or dizziness; only that she was tired. Pt returned to bed per pt request. Call bell within reach. Encouraged pt to keep walking with staff.   [phone]  Carma Lair MS, ACSM CEP  11:30 AM 10/24/2017

## 2017-10-24 NOTE — Progress Notes (Addendum)
[address]       [place] [address]             [phone]      5 Days Post-Op Procedure(s) (LRB): CORONARY ARTERY BYPASS GRAFTING (CABG) times four using the right saphaneous vein. Harvested endoscopicly and left internal mammary artery. (N/A) TRANSESOPHAGEAL ECHOCARDIOGRAM (TEE) (N/A) Subjective: Minor musculoskeletal discomforts, primarily neck  Objective: Vital signs in last 24 hours: Temp:  [98.2 F (36.8 C)-98.3 F (36.8 C)] 98.3 F (36.8 C) (02/02 0506) Pulse Rate:  [80-91] 86 (02/02 0506) Cardiac Rhythm: Normal sinus rhythm (02/02 0701) Resp:  [16-23] 21 (02/02 0506) BP: (124-157)/(57-77) 157/76 (02/02 0506) SpO2:  [94 %] 94 % (02/02 0506) Weight:  [160 lb 8 oz (72.8 kg)] 160 lb 8 oz (72.8 kg) (02/02 0506)  Hemodynamic parameters for last 24 hours:    Intake/Output from previous day: No intake/output data recorded. Intake/Output this shift: No intake/output data recorded.  General appearance: alert, cooperative and no distress Heart: regular rate and rhythm Lungs: dim in bases Abdomen: benign Extremities: no edema Wound: incis healing well  Lab Results: Recent Labs    10/23/17 0231  WBC 6.1  HGB 7.7*  HCT 24.3*  PLT 160   BMET:  Recent Labs    10/23/17 0231  NA 135  K 3.9  CL 103  CO2 23  GLUCOSE 158*  BUN 12  CREATININE 0.82  CALCIUM 8.6*    PT/INR: No results for input(s): LABPROT, INR in the last 72 hours. ABG    Component Value Date/Time   PHART 7.308 (L) 10/19/2017 1902   HCO3 23.3 10/19/2017 1902   TCO2 22 10/20/2017 1653   ACIDBASEDEF 3.0 (H) 10/19/2017 1902   O2SAT 98.0 10/19/2017 1902   CBG (last 3)  Recent Labs    10/23/17 1616 10/23/17 2114 10/24/17 0609  GLUCAP 148* 196* 214*    Meds Scheduled Meds: . aspirin EC  81 mg Oral Daily  . atorvastatin  80 mg Oral q1800  . buPROPion  150 mg Oral Daily  . enoxaparin (LOVENOX) injection  30 mg Subcutaneous QHS  . glipiZIDE  10 mg Oral QAC  breakfast  . insulin aspart  0-24 Units Subcutaneous TID AC & HS  . linagliptin  5 mg Oral Daily  . metoprolol tartrate  25 mg Oral BID  . pantoprazole  40 mg Oral QAC breakfast  . sertraline  50 mg Oral Daily  . sodium chloride flush  3 mL Intravenous Q12H   Continuous Infusions: . sodium chloride     PRN Meds:.sodium chloride, acetaminophen, alum & mag hydroxide-simeth, bisacodyl **OR** bisacodyl, guaiFENesin, magnesium hydroxide, ondansetron **OR** ondansetron (ZOFRAN) IV, oxyCODONE, sodium chloride flush, traMADol  Xrays Dg Chest 2 View  Result Date: 10/23/2017 CLINICAL DATA:  Post bypass surgery EXAM: CHEST  2 VIEW COMPARISON:  Chest radiograph from one day prior. FINDINGS: Intact sternotomy wires. Stable cardiomediastinal silhouette with mild cardiomegaly. No pneumothorax. Small bilateral pleural effusions, stable. No overt pulmonary edema. Bibasilar atelectasis, with slightly improved aeration on the left. IMPRESSION: 1. No pneumothorax. 2. Stable small bilateral pleural effusions. 3. Stable cardiomegaly without overt pulmonary edema. 4. Bibasilar atelectasis, with slightly improved aeration on the left. Electronically Signed   By: Ilona Sorrel M.D.   On: 10/23/2017 08:16    Assessment/Plan: S/P Procedure(s) (LRB): CORONARY ARTERY BYPASS GRAFTING (CABG) times four using the right saphaneous vein. Harvested endoscopicly and left internal mammary artery. (N/A) TRANSESOPHAGEAL ECHOCARDIOGRAM (TEE) (N/A)  1  hemodyn stable in sinus rhythm- BP elevated - should tol resuming ace- I, will give half home lisinopril dose to start 2 diabetes fair control- home meds at time of d/c- needs low carb lifestyle changes 3 routine pulm toilet and rehab 4 conts home psych meds 5 prob home in am  LOS: 14 days    Chart reviewed, patient examined, agree with above. She should be able to go home in am if no changes.  John Giovanni 10/24/2017

## 2017-10-24 NOTE — Progress Notes (Signed)
Offered to walk with Pt, she declined saying she was tired from visitor who just left.  Will try again later.

## 2017-10-24 NOTE — Progress Notes (Signed)
Pt ambulating hall independently

## 2017-10-25 DIAGNOSIS — Z951 Presence of aortocoronary bypass graft: Secondary | ICD-10-CM

## 2017-10-25 DIAGNOSIS — Z720 Tobacco use: Secondary | ICD-10-CM

## 2017-10-25 LAB — GLUCOSE, CAPILLARY: Glucose-Capillary: 166 mg/dL — ABNORMAL HIGH (ref 65–99)

## 2017-10-25 MED ORDER — OXYCODONE HCL 5 MG PO TABS: 5.0000 mg | ORAL_TABLET | Freq: Four times a day (QID) | ORAL | 0 refills | Status: DC | PRN

## 2017-10-25 MED ORDER — ASPIRIN 81 MG PO TBEC: 81.0000 mg | DELAYED_RELEASE_TABLET | Freq: Every day | ORAL | Status: DC

## 2017-10-25 MED ORDER — ATORVASTATIN CALCIUM 80 MG PO TABS: 80.0000 mg | ORAL_TABLET | Freq: Every day | ORAL | 1 refills | Status: DC

## 2017-10-25 MED ORDER — LISINOPRIL 20 MG PO TABS: 20.0000 mg | ORAL_TABLET | Freq: Every day | ORAL | 1 refills | Status: DC

## 2017-10-25 MED ORDER — LISINOPRIL 10 MG PO TABS
20.0000 mg | ORAL_TABLET | Freq: Every day | ORAL | Status: DC
  Administered 2017-10-25: 20 mg via ORAL
  Filled 2017-10-25: qty 2

## 2017-10-25 NOTE — Progress Notes (Signed)
Progress Note  Patient Name: Victoria Eaton Date of Encounter: 10/25/2017  Primary Cardiologist:   Pixie Casino, MD   Subjective   She is ready to go home.  Mild incisional chest pain.   Inpatient Medications    Scheduled Meds: . aspirin EC  81 mg Oral Daily  . atorvastatin  80 mg Oral q1800  . buPROPion  150 mg Oral Daily  . enoxaparin (LOVENOX) injection  30 mg Subcutaneous QHS  . glipiZIDE  10 mg Oral QAC breakfast  . insulin aspart  0-24 Units Subcutaneous TID AC & HS  . linagliptin  5 mg Oral Daily  . lisinopril  20 mg Oral Daily  . metoprolol tartrate  25 mg Oral BID  . pantoprazole  40 mg Oral QAC breakfast  . sertraline  50 mg Oral Daily  . sodium chloride flush  3 mL Intravenous Q12H   Continuous Infusions: . sodium chloride     PRN Meds: sodium chloride, acetaminophen, alum & mag hydroxide-simeth, bisacodyl **OR** bisacodyl, guaiFENesin, magnesium hydroxide, ondansetron **OR** ondansetron (ZOFRAN) IV, oxyCODONE, sodium chloride flush, traMADol   Vital Signs    Vitals:   10/25/17 0347 10/25/17 0349 10/25/17 0526 10/25/17 0930  BP: (!) 152/82 (!) 152/82  133/71  Pulse:  81    Resp: 17 14  20   Temp:  98.1 F (36.7 C)    TempSrc:  Oral    SpO2:  97%    Weight:   158 lb 9.6 oz (71.9 kg)   Height:        Intake/Output Summary (Last 24 hours) at 10/25/2017 1032 Last data filed at 10/25/2017 0351 Gross per 24 hour  Intake 240 ml  Output -  Net 240 ml   Filed Weights   10/23/17 0407 10/24/17 0506 10/25/17 0526  Weight: 160 lb 12.8 oz (72.9 kg) 160 lb 8 oz (72.8 kg) 158 lb 9.6 oz (71.9 kg)    Telemetry    NSR - Personally Reviewed  ECG    NA - Personally Reviewed  Physical Exam   GEN: No acute distress.   Neck: No  JVD Cardiac: RRR, no murmurs, rubs, or gallops.  Respiratory:   Few basilar crackles to auscultation bilaterally. GI: Soft, nontender, non-distended  MS:  Trace edema; No deformity. Neuro:  Nonfocal  Psych: Normal affect    Labs    Chemistry Recent Labs  Lab 10/20/17 0414 10/20/17 1640 10/20/17 1653 10/21/17 0441 10/23/17 0231  NA 136  --  136 134* 135  K 4.0  --  4.2 4.2 3.9  CL 109  --  103 103 103  CO2 22  --   --  23 23  GLUCOSE 125*  --  167* 142* 158*  BUN 15  --  14 13 12   CREATININE 0.77 0.83 0.70 0.93 0.82  CALCIUM 8.1*  --   --  8.6* 8.6*  GFRNONAA >60 >60  --  >60 >60  GFRAA >60 >60  --  >60 >60  ANIONGAP 5  --   --  8 9    Lab Results  Component Value Date   HGBA1C 8.5 (H) 10/18/2017    Hematology Recent Labs  Lab 10/20/17 1640 10/20/17 1653 10/21/17 0441 10/23/17 0231  WBC 7.4  --  6.7 6.1  RBC 2.98*  --  2.81* 2.91*  HGB 7.9* 8.5* 7.4* 7.7*  HCT 25.5* 25.0* 24.0* 24.3*  MCV 85.6  --  85.4 83.5  MCH 26.5  --  26.3 26.5  MCHC 31.0  --  30.8 31.7  RDW 19.1*  --  19.2* 18.8*  PLT 121*  --  136* 160    Cardiac EnzymesNo results for input(s): TROPONINI in the last 168 hours. No results for input(s): TROPIPOC in the last 168 hours.   BNPNo results for input(s): BNP, PROBNP in the last 168 hours.   DDimer No results for input(s): DDIMER in the last 168 hours.   Radiology    No results found.  Cardiac Studies   10/14/17  Cath  Conclusion     Prox RCA lesion is 80% stenosed.  Mid RCA to Dist RCA lesion is 70% stenosed.  Dist RCA lesion is 70% stenosed.  Prox LAD to Mid LAD lesion is 90% stenosed.  Mid LAD to Dist LAD lesion is 85% stenosed.  Ost 1st Diag lesion is 80% stenosed.  Lat 1st Diag lesion is 90% stenosed.  1st Diag lesion is 90% stenosed.  Ost 1st Mrg to 1st Mrg lesion is 90% stenosed.  There is moderate left ventricular systolic dysfunction.  LV end diastolic pressure is mildly elevated.  The left ventricular ejection fraction is 35-45% by visual estimate.   1. Severe 3 vessel obstructive CAD 2. Moderate LV dysfunction 3. Mildly elevated LVEDP      Patient Profile     57 y.o. female with a hx of CAD c/b MI s/p PCI with  remote RCA stent, DM2, HTN, depressionwho was seen for the evaluation of ECG changes and positive troponinat the request of [employer]. Disease as above.  She is now status post CABG.     Assessment & Plan    CABG:  Home today.  She has follow up our office on Valentines Day.    HTN:  ACE resumed and we will follow.  She can keep a BP diary at home.    DM:   A1C as above.  She has a new patient appt in primary care clinic on 2/12.  GALLSTONE PANCREATITIS:   I would suggest that she be referred to GI to follow this.  She might ultimately need surgery or other procedure.   TOBACCO ABUSE:  Educated.  She will find this hard.  I suggested dual nicotine replacement therapy.    For questions or updates, please contact [facility] Please consult [email or web address] for contact info under Cardiology/STEMI.   Signed, Minus Breeding, MD  10/25/2017, 10:32 AM

## 2017-10-25 NOTE — Care Management Note (Signed)
Case Management Note  Patient Details  Name: Idell Hissong MRN: [id number] Date of Birth: November 07, 1960  Subjective/Objective:    From home, pta indep,  S/p cath, shows 3 vessel dz, plan for CABG on Monday.  Her daughter will be with her after surgery to assist her.  She states her [employer] has expired, and she is in the process of applying for new insurance.  She would like to be set up with the [facility] clinic.  She states she was going to [facility] urgent care but would like to get an apt at [facility] clinic if she can.   NCM will also assist her with a Match Letter at discharge if needed. Will need to call and schedule a follow up apt when close to discharge.                   Action/Plan: For CABG on Monday.  Expected Discharge Date:  10/25/17               Expected Discharge Plan:  Home/Self Care  In-House Referral:  NA  Discharge planning Services  CM Consult, Medication Assistance, [place] Program  Post Acute Care Choice:  NA Choice offered to:  NA  DME Arranged:    DME Agency:     HH Arranged:    HH Agency:     Status of Service:  Completed, signed off  If discussed at [employer] of Stay Meetings, dates discussed:    Discharge Disposition: home/self care   Additional Comments:  10/25/17- 1000- Rena Sweeden RN, CM- pt for d/c home today- medications reviewed- cost around $12 for two meds- then pain medications will be a barrier- CM will assist with medications for discharge and include pain medication coverage into MATCH coverage for $3 copay. [place] letter provided to pt at bedside along with list of pharmacies to use for one time assistance with [facility] letter on discharge.   10/23/17- 1520- Luvinia Lucy RN CM- spoke with pt at bedside regarding transition needs- pt states she has a PCP but agreeable to one of the [facility]- call made to [facility] today however they do not have any available appointment- call then made to [facility]- who had a f/u appointment for Feb. 12 at  9 am- info given to pt on clinic- discussed DME and other possible needs- pt reports daughter to be with her- no DME needs identified- CM will look at medications prior to discharge to see if pt will need [place] letter.   Dahlia Client [place], RN 10/25/2017, 10:11 AM [phone] 4E Case manager

## 2017-10-25 NOTE — Progress Notes (Addendum)
Patient given and reviewed discharge instructions, medication list and paper prescriptions. CT sutures removed and steri strips applied. IV and tele dcd will discharge home as ordered. transproted to exit with wheelchair and nursing staff. Ariann Khaimov, Bettina Gavia RN

## 2017-10-25 NOTE — Progress Notes (Signed)
      [address]       [place] [address]             [phone]      6 Days Post-Op Procedure(s) (LRB): CORONARY ARTERY BYPASS GRAFTING (CABG) times four using the right saphaneous vein. Harvested endoscopicly and left internal mammary artery. (N/A) TRANSESOPHAGEAL ECHOCARDIOGRAM (TEE) (N/A) Subjective: Feels well, anxious to go home  Objective: Vital signs in last 24 hours: Temp:  [98.1 F (36.7 C)-98.4 F (36.9 C)] 98.1 F (36.7 C) (02/03 0349) Pulse Rate:  [81-98] 81 (02/03 0349) Cardiac Rhythm: Normal sinus rhythm (02/03 0748) Resp:  [14-22] 14 (02/03 0349) BP: (135-152)/(76-86) 152/82 (02/03 0349) SpO2:  [95 %-97 %] 97 % (02/03 0349) Weight:  [158 lb 9.6 oz (71.9 kg)] 158 lb 9.6 oz (71.9 kg) (02/03 0526)  Hemodynamic parameters for last 24 hours:    Intake/Output from previous day: 02/02 0701 - 02/03 0700 In: 240 [P.O.:240] Out: -  Intake/Output this shift: No intake/output data recorded.  General appearance: alert, cooperative and no distress Heart: regular rate and rhythm Lungs: dim left lower fields Abdomen: benign Extremities: no edema Wound: incis healing well  Lab Results: Recent Labs    10/23/17 0231  WBC 6.1  HGB 7.7*  HCT 24.3*  PLT 160   BMET:  Recent Labs    10/23/17 0231  NA 135  K 3.9  CL 103  CO2 23  GLUCOSE 158*  BUN 12  CREATININE 0.82  CALCIUM 8.6*    PT/INR: No results for input(s): LABPROT, INR in the last 72 hours. ABG    Component Value Date/Time   PHART 7.308 (L) 10/19/2017 1902   HCO3 23.3 10/19/2017 1902   TCO2 22 10/20/2017 1653   ACIDBASEDEF 3.0 (H) 10/19/2017 1902   O2SAT 98.0 10/19/2017 1902   CBG (last 3)  Recent Labs    10/24/17 1616 10/24/17 2137 10/25/17 0651  GLUCAP 113* 142* 166*    Meds Scheduled Meds: . aspirin EC  81 mg Oral Daily  . atorvastatin  80 mg Oral q1800  . buPROPion  150 mg Oral Daily  . enoxaparin (LOVENOX) injection  30 mg Subcutaneous QHS  . glipiZIDE   10 mg Oral QAC breakfast  . insulin aspart  0-24 Units Subcutaneous TID AC & HS  . linagliptin  5 mg Oral Daily  . metoprolol tartrate  25 mg Oral BID  . pantoprazole  40 mg Oral QAC breakfast  . sertraline  50 mg Oral Daily  . sodium chloride flush  3 mL Intravenous Q12H   Continuous Infusions: . sodium chloride     PRN Meds:.sodium chloride, acetaminophen, alum & mag hydroxide-simeth, bisacodyl **OR** bisacodyl, guaiFENesin, magnesium hydroxide, ondansetron **OR** ondansetron (ZOFRAN) IV, oxyCODONE, sodium chloride flush, traMADol  Xrays No results found.  Assessment/Plan: S/P Procedure(s) (LRB): CORONARY ARTERY BYPASS GRAFTING (CABG) times four using the right saphaneous vein. Harvested endoscopicly and left internal mammary artery. (N/A) TRANSESOPHAGEAL ECHOCARDIOGRAM (TEE) (N/A) Plan for discharge: see discharge orders  Lisinopril and norvasc for HTN No DPP4 meds with pancreatitis  LOS: 15 days    John Giovanni 10/25/2017

## 2017-10-26 ENCOUNTER — Telehealth: Payer: Self-pay

## 2017-10-26 ENCOUNTER — Other Ambulatory Visit: Payer: Self-pay | Admitting: Physician Assistant

## 2017-10-26 DIAGNOSIS — I252 Old myocardial infarction: Secondary | ICD-10-CM

## 2017-10-26 LAB — GLUCOSE, CAPILLARY: Glucose-Capillary: 162 mg/dL — ABNORMAL HIGH (ref 65–99)

## 2017-10-26 MED ORDER — METOPROLOL TARTRATE 25 MG PO TABS: 25.0000 mg | ORAL_TABLET | Freq: Two times a day (BID) | ORAL | 1 refills | Status: DC

## 2017-10-26 MED FILL — LISINOPRIL 20 MG TABLET: 20 | 30 days supply | Qty: 30 | Fill #0

## 2017-10-26 MED FILL — oxyCODONE HCL 5 MG TABS: 5 | 7 days supply | Qty: 30 | Fill #0

## 2017-10-26 MED FILL — ATORVASTATIN 80 MG TABLET: 80 | 30 days supply | Qty: 30 | Fill #0

## 2017-10-26 NOTE — Telephone Encounter (Addendum)
Will refill Lopressor 25 MG tablets but refuse Hydrochlorothiazide as it was discontinued on 10/03/17 by Dr. Tarri Abernethy.

## 2017-10-27 ENCOUNTER — Other Ambulatory Visit: Payer: Self-pay

## 2017-10-27 ENCOUNTER — Telehealth (HOSPITAL_COMMUNITY): Payer: Self-pay

## 2017-10-27 ENCOUNTER — Ambulatory Visit (INDEPENDENT_AMBULATORY_CARE_PROVIDER_SITE_OTHER): Payer: BLUE CROSS/BLUE SHIELD | Admitting: Physician Assistant

## 2017-10-27 ENCOUNTER — Encounter: Payer: Self-pay | Admitting: Physician Assistant

## 2017-10-27 VITALS — BP 132/74 | HR 90 | Temp 98.2°F | Resp 18 | Ht 65.91 in | Wt 154.8 lb

## 2017-10-27 DIAGNOSIS — L7682 Other postprocedural complications of skin and subcutaneous tissue: Secondary | ICD-10-CM

## 2017-10-27 DIAGNOSIS — Z09 Encounter for follow-up examination after completed treatment for conditions other than malignant neoplasm: Secondary | ICD-10-CM

## 2017-10-27 DIAGNOSIS — I252 Old myocardial infarction: Secondary | ICD-10-CM | POA: Diagnosis not present

## 2017-10-27 DIAGNOSIS — I10 Essential (primary) hypertension: Secondary | ICD-10-CM

## 2017-10-27 NOTE — Telephone Encounter (Signed)
I am happy to see her.  Thank you.

## 2017-10-27 NOTE — Telephone Encounter (Signed)
Referral received and patient is medicaid potential. Called to speak with patient - was not very nice. Patient stated she could not take this right now and she is in the process of filling out financial paperwork if we could wait. Will place referral on hold.

## 2017-10-27 NOTE — Telephone Encounter (Signed)
Transition Care Management Follow-up Telephone Call   Date discharged? 10/25/17   How have you been since you were released from the hospital? Patient states that she is feeling fine but she doesn't like the way her incision looks. She wants to be seen today.    Do you understand why you were in the hospital? yes   Do you understand the discharge instructions? yes   Where were you discharged to? home   Items Reviewed:  Medications reviewed: yes  Allergies reviewed: yes  Dietary changes reviewed: yes  Referrals reviewed: yes   Functional Questionnaire:   Activities of Daily Living (ADLs):   She states they are independent in the following: ambulation, bathing and hygiene, feeding, continence, grooming, toileting and dressing States they require assistance with the following: none   Any transportation issues/concerns?: no   Any patient concerns? yes, Patient is concerned about the way her incision looks.   Confirmed importance and date/time of follow-up visits scheduled yes  Provider Appointment booked with Juanda Crumble today 10/27/17 @ 11:40 am.   Confirmed with patient if condition begins to worsen call PCP or go to the ER.  Patient was given the office number and encouraged to call back with question or concerns.  : yes

## 2017-10-27 NOTE — Progress Notes (Signed)
Victoria Eaton  MRN: [id number] DOB: [birth year]  PCP: Leonie Douglas, PA-C  Subjective:  Pt is a 57 year old female PMH HTN, CAD, DM, anxiety and depression HLD who presents to clinic for several complaints.   Pain of incision site s/p coronary artery bypass grafting x 4 on 10/19/2017.  She has f/u appt with cardiology soon.   Denies drainage, swelling, fever, chills, chest pain, palpitations.   HTN - Controlled on HCTZ 34m qd, lisinopril 154mqd and metoprolol tartrate 2543mID- blood pressure today is 132/74. She would like refill of lopressor 32m70m She needs FMLA paperwork filled out. She brought the incorrect forms today.   Review of Systems  Constitutional: Negative for chills, diaphoresis, fatigue and fever.  Respiratory: Negative for cough.   Cardiovascular: Negative for chest pain, palpitations and leg swelling.  Skin: Positive for wound.    Patient Active Problem List   Diagnosis Date Noted  . S/P CABG x 4 10/19/2017  . Epigastric pain   . Elevated troponin level   . CAD in native artery   . Pancreatitis 10/10/2017  . Pyelonephritis 10/02/2017  . Hyperlipidemia LDL goal <70 09/14/2017  . Type 2 diabetes mellitus without complication, without long-term current use of insulin (HCC)[place]/06/2017  . Essential hypertension 05/01/2017  . Anxiety and depression 05/01/2017  . Tobacco abuse 05/01/2017  . History of MI (myocardial infarction) 05/01/2017  . Allergic rhinitis 05/01/2017  . History of arthritis 05/01/2017  . Bladder prolapse, female, acquired 11/09/2013    Current Outpatient Medications on File Prior to Visit  Medication Sig Dispense Refill  . amLODipine (NORVASC) 5 MG tablet Take 1 tablet (5 mg total) by mouth daily. 30 tablet 0  . aspirin EC 81 MG EC tablet Take 1 tablet (81 mg total) by mouth daily.    . atMarland Kitchenrvastatin (LIPITOR) 80 MG tablet Take 1 tablet (80 mg total) by mouth daily at 6 PM. 30 tablet 1  . buPROPion (WELLBUTRIN XL) 150 MG 24 hr  tablet TAKE ONE TABLET BY MOUTH ONE TIME DAILY 90 tablet 0  . EPINEPHrine (EPIPEN 2-PAK) 0.3 mg/0.3 mL IJ SOAJ injection Inject 0.3 mLs (0.3 mg total) into the muscle once as needed. (Patient taking differently: Inject 0.3 mg into the muscle as needed (allergic reaction). ) 1 Device 1  . FARXIGA 10 MG TABS tablet TAKE ONE TABLET BY MOUTH ONE TIME DAILY 90 tablet 0  . glipiZIDE (GLUCOTROL) 10 MG tablet TAKE ONE TABLET BY MOUTH TWICE A DAY BEFORE A MEAL 180 tablet 0  . lisinopril (PRINIVIL,ZESTRIL) 20 MG tablet Take 1 tablet (20 mg total) by mouth daily. 30 tablet 1  . loratadine (CLARITIN) 10 MG tablet Take 1 tablet (10 mg total) by mouth daily. 90 tablet 3  . metoprolol tartrate (LOPRESSOR) 25 MG tablet Take 1 tablet (25 mg total) by mouth 2 (two) times daily. 180 tablet 1  . Multiple Vitamin (MULTIVITAMIN WITH MINERALS) TABS tablet Take 1 tablet by mouth daily.    . Omega-3 Fatty Acids (FISH OIL CONCENTRATE) 300 MG CAPS Take 1 capsule by mouth daily.    . oxMarland KitchenCODONE (OXY IR/ROXICODONE) 5 MG immediate release tablet Take 1 tablet (5 mg total) by mouth every 6 (six) hours as needed for severe pain. 30 tablet 0  . sertraline (ZOLOFT) 50 MG tablet Take 1 tablet (50 mg total) by mouth daily. 90 tablet 1   No current facility-administered medications on file prior to visit.     Allergies  Allergen Reactions  . Kiwi Extract Hives  . Shrimp [Shellfish Allergy] Hives  . Penicillins     Reaction as a child      Objective:  BP 132/74 (BP Location: Right Arm, Patient Position: Sitting, Cuff Size: Normal)   Pulse 90   Temp 98.2 F (36.8 C) (Oral)   Resp 18   Ht 5' 5.91" (1.674 m)   Wt 154 lb 12.8 oz (70.2 kg)   SpO2 96%   BMI 25.06 kg/m   Physical Exam  Constitutional: She is oriented to person, place, and time and well-developed, well-nourished, and in no distress. No distress.  Cardiovascular: Normal rate, regular rhythm and normal heart sounds.  Neurological: She is alert and oriented to  person, place, and time. GCS score is 15.  Skin: Skin is warm and dry.     Incision along central chest healing well. No warmth, erythema, streaking, drainage, weeping.   Psychiatric: Mood, memory, affect and judgment normal.  Vitals reviewed.      Assessment and Plan :  1. Pain at surgical incision 2. History of MI (myocardial infarction 3. Encounter for examination following treatment at hospital - pt presents c/o Pain of incision site s/p coronary artery bypass grafting x 4 on 10/19/2017.  No sign of infection or dehiscence. Advised con't wound care. She has f/u appt with cardiology soon.  She has not yet made her f/u appt with general surgery or community health and wellness- she has the contact number and name.   4. Essential hypertension - Controlled on HCTZ 9m qd, lisinopril 169mqd and metoprolol tartrate 2520mID- blood pressure today is 132/74. OK to refill lopressor 76m49m [facility]/2019 12:09 PM

## 2017-10-27 NOTE — Patient Instructions (Addendum)
  Schedule a follow-up appointment with general surgery: [phone] Schedule an appointment with community health and wellness: [phone]   Continue keeping your wound clean and dry. It looks great. You do not need to put any medication on it. Follow-up with your surgeon's office!   Pick up your blood pressure medication at your pharmacy.   Thank you for coming in today. I hope you feel we met your needs.  Feel free to call PCP if you have any questions or further requests.  Please consider signing up for MyChart if you do not already have it, as this is a great way to communicate with me.  Best,  Whitney McVey, PA-C  IF you received an x-ray today, you will receive an invoice from [facility]. Please contact [facility] at [phone] with questions or concerns regarding your invoice.   IF you received labwork today, you will receive an invoice from [facility]. Please contact [facility] at [phone] with questions or concerns regarding your invoice.   Our billing staff will not be able to assist you with questions regarding bills from these companies.  You will be contacted with the lab results as soon as they are available. The fastest way to get your results is to activate your My Chart account. Instructions are located on the last page of this paperwork. If you have not heard from [place] regarding the results in 2 weeks, please contact this office.

## 2017-10-30 ENCOUNTER — Other Ambulatory Visit: Payer: Self-pay | Admitting: Internal Medicine

## 2017-11-03 ENCOUNTER — Ambulatory Visit: Payer: Medicaid Other | Admitting: Family Medicine

## 2017-11-05 ENCOUNTER — Ambulatory Visit: Payer: Medicaid Other | Admitting: Adult Health

## 2017-11-08 NOTE — Progress Notes (Signed)
Cardiology Office Note   Date:  11/10/2017   ID:  Victoria Eaton, DOB [birth year], MRN [id number]  PCP:  Leonie Douglas, PA-C  Cardiologist: Dr. Debara Pickett Chief Complaint  Patient presents with  . Hospitalization Follow-up  . Coronary Artery Disease    s/p CABG     History of Present Illness: Victoria Eaton is a 57 y.o. female who presents for post hospitalization, ongoing assessment and management of coronary artery disease, status post MI with cardiac catheterization PCI with remote RCA stent, type 2 diabetes, hypertension, and depression.    The patient had a cardiac catheterization on 10/14/2017 with multivessel disease, and subsequent coronary artery bypass grafting, x4 (LIMA to LAD, reversed saphenous vein graft to first diagonal, reverse saphenous vein graft to the obtuse marginal, reverse saphenous vein graft to the distal right coronary artery with right greater saphenous endoscopic harvesting from the thigh, and calf).  Patient with found to have a small pleural effusion postoperatively, and left apical pneumo on chest x-ray.  The patient was initiated on diuretic regimen for fluid overload and did well.  She was restarted on lisinopril and amlodipine in the setting of hypertension.  The patient was given education on aggressive nutrition and lifestyle management for diabetes.  She comes today feeling fatigued and frustrated that she doesn't have the energy she used to. She states she she's been cleaning her house, taking care of her rabbit at home, and being able to complete ADLs without becoming fatigued. She is frustrated that she cannot move forward faster. She tries to push herself she becomes fatigued.  She is medically compliant. Still has some soreness of the sternotomy site, he denies bleeding or signs of infection.  Past Medical History:  Diagnosis Date  . Anxiety   . Arthritis    "maybe in my left foot" (10/14/2017)  . Bladder prolapse, female, acquired 09/14/2017  .  Depression   . High cholesterol   . History of stomach ulcers   . Hypertension   . Myocardial infarction ([place]) 12/30/2004  . Type II diabetes mellitus ([place])     Past Surgical History:  Procedure Laterality Date  . CARDIAC CATHETERIZATION  10/14/2017  . CORONARY ANGIOPLASTY WITH STENT PLACEMENT  12/30/2004   Patient reported  . CORONARY ARTERY BYPASS GRAFT N/A 10/19/2017   Procedure: CORONARY ARTERY BYPASS GRAFTING (CABG) times four using the right saphaneous vein. Harvested endoscopicly and left internal mammary artery.;  Surgeon: Grace Isaac, MD;  Location: [facility];  Service: Open Heart Surgery;  Laterality: N/A;  . DILATION AND CURETTAGE OF UTERUS  1980s  . LEFT HEART CATH AND CORONARY ANGIOGRAPHY N/A 10/14/2017   Procedure: LEFT HEART CATH AND CORONARY ANGIOGRAPHY;  Surgeon: Martinique, Peter M, MD;  Location: [facility];  Service: Cardiovascular;  Laterality: N/A;  . PILONIDAL CYST EXCISION  1980s  . TEE WITHOUT CARDIOVERSION N/A 10/19/2017   Procedure: TRANSESOPHAGEAL ECHOCARDIOGRAM (TEE);  Surgeon: Grace Isaac, MD;  Location: [facility];  Service: Open Heart Surgery;  Laterality: N/A;     Current Outpatient Medications  Medication Sig Dispense Refill  . amLODipine (NORVASC) 5 MG tablet Take 1 tablet (5 mg total) by mouth daily. 30 tablet 0  . aspirin EC 81 MG EC tablet Take 1 tablet (81 mg total) by mouth daily.    [name] atorvastatin (LIPITOR) 80 MG tablet Take 1 tablet (80 mg total) by mouth daily at 6 PM. 30 tablet 1  . buPROPion (WELLBUTRIN XL) 150 MG 24 hr tablet TAKE  ONE TABLET BY MOUTH ONE TIME DAILY 90 tablet 0  . EPINEPHrine (EPIPEN 2-PAK) 0.3 mg/0.3 mL IJ SOAJ injection Inject 0.3 mLs (0.3 mg total) into the muscle once as needed. (Patient taking differently: Inject 0.3 mg into the muscle as needed (allergic reaction). ) 1 Device 1  . FARXIGA 10 MG TABS tablet TAKE ONE TABLET BY MOUTH ONE TIME DAILY 90 tablet 0  . glipiZIDE (GLUCOTROL) 10 MG tablet TAKE ONE TABLET  BY MOUTH TWICE A DAY BEFORE A MEAL 180 tablet 0  . lisinopril (PRINIVIL,ZESTRIL) 20 MG tablet Take 1 tablet (20 mg total) by mouth daily. 30 tablet 1  . loratadine (CLARITIN) 10 MG tablet Take 1 tablet (10 mg total) by mouth daily. 90 tablet 3  . metoprolol tartrate (LOPRESSOR) 25 MG tablet Take 1 tablet (25 mg total) by mouth 2 (two) times daily. 180 tablet 1  . Multiple Vitamin (MULTIVITAMIN WITH MINERALS) TABS tablet Take 1 tablet by mouth daily.    . Omega-3 Fatty Acids (FISH OIL CONCENTRATE) 300 MG CAPS Take 1 capsule by mouth daily.    [name] oxyCODONE (OXY IR/ROXICODONE) 5 MG immediate release tablet Take 1 tablet (5 mg total) by mouth every 6 (six) hours as needed for severe pain. 30 tablet 0  . sertraline (ZOLOFT) 50 MG tablet Take 1 tablet (50 mg total) by mouth daily. 90 tablet 1  . traMADol (ULTRAM) 50 MG tablet Take 1 tablet (50 mg total) by mouth every 8 (eight) hours as needed. 30 tablet 0   No current facility-administered medications for this visit.     Allergies:   Kiwi extract; Shrimp [shellfish allergy]; and Penicillins    Social History:  The patient  reports that she quit smoking about 5 weeks ago. Her smoking use included cigarettes. She started smoking about 45 years ago. She has a 5.40 pack-year smoking history. she has never used smokeless tobacco. She reports that she drinks about 8.4 oz of alcohol per week. She reports that she does not use drugs.   Family History:  The patient's family history includes Heart disease in her father; Hyperlipidemia in her father; Hypertension in her father.    ROS: All other systems are reviewed and negative. Unless otherwise mentioned in H&P    PHYSICAL EXAM: VS:  BP 132/76   Pulse 72   Ht 5' 6"  (1.676 m)   Wt 155 lb (70.3 kg)   SpO2 98%   BMI 25.02 kg/m  , BMI Body mass index is 25.02 kg/m. GEN: Well nourished, well developed, in no acute distress  HEENT: normal  Neck: no JVD, carotid bruits, or masses Cardiac: RRR; distant  heart sounds no murmurs, rubs, or gallops,no edema  Respiratory:  Clear to auscultation bilaterally, normal work of breathing GI: soft, nontender, nondistended, + BS MS: no deformity or atrophy midline sternotomy incision site is well-healed without evidence of bleeding or infection. Right leg saphenous vein graft harvest site is well-healed without evidence of bleeding or infection Skin: warm and dry, no rash Neuro:  Strength and sensation are intact Psych: euthymic mood, full affect   Recent Labs: 10/16/2017: ALT 50 10/20/2017: Magnesium 2.0 10/23/2017: BUN 12; Creatinine, Ser 0.82; Hemoglobin 7.7; Platelets 160; Potassium 3.9; Sodium 135    Lipid Panel    Component Value Date/Time   CHOL 137 10/10/2017 1848   CHOL 175 05/01/2017 0939   TRIG 144 10/10/2017 1848   HDL 38 (L) 10/10/2017 1848   HDL 53 05/01/2017 0939   CHOLHDL 3.6 10/10/2017  1848   VLDL 29 10/10/2017 1848   LDLCALC 70 10/10/2017 1848   LDLCALC 86 05/01/2017 0939      Wt Readings from Last 3 Encounters:  11/10/17 155 lb (70.3 kg)  10/27/17 154 lb 12.8 oz (70.2 kg)  10/25/17 158 lb 9.6 oz (71.9 kg)      Other studies Reviewed: Cardiac Cath 10/14/2017 Conclusion     Prox RCA lesion is 80% stenosed.  Mid RCA to Dist RCA lesion is 70% stenosed.  Dist RCA lesion is 70% stenosed.  Prox LAD to Mid LAD lesion is 90% stenosed.  Mid LAD to Dist LAD lesion is 85% stenosed.  Ost 1st Diag lesion is 80% stenosed.  Lat 1st Diag lesion is 90% stenosed.  1st Diag lesion is 90% stenosed.  Ost 1st Mrg to 1st Mrg lesion is 90% stenosed.  There is moderate left ventricular systolic dysfunction.  LV end diastolic pressure is mildly elevated.  The left ventricular ejection fraction is 35-45% by visual estimate.   1. Severe 3 vessel obstructive CAD 2. Moderate LV dysfunction 3. Mildly elevated LVEDP  Plan: Discussed with Dr. Daneen Schick. Will need to discuss with general surgery for management of gallbladder  disease. Patient at high risk for general surgery. Will ultimately need to be considered for CABG.    Echocardiogram 10/12/2017 Left ventricle: Septal apical and mid/apical inferior wall   hypokinesis. The cavity size was moderately dilated. Wall   thickness was increased in a pattern of moderate LVH. Systolic   function was normal. The estimated ejection fraction was 40%.   Wall motion was normal; there were no regional wall motion   abnormalities. Left ventricular diastolic function parameters   were normal. - Mitral valve: There was mild regurgitation. - Left atrium: The atrium was mildly dilated. - Atrial septum: A patent foramen ovale cannot be excluded.  ASSESSMENT AND PLAN:  1. Coronary artery disease: Status post coronary artery bypass grafting 4. She is not yet followed up with TCTS, and has not yet began cardiac rehabilitation. She is tolerating her medications well and has no complaints of dizziness or bleeding.  I spent a considerable amount of time answering multiple questions concerning activity, medication, and recovery I discussed with her about overdoing feel fully energetic. She is to follow-up with cardiovascular surgeon: March 4 postoperative evaluation and relief to participate in cardiac rehabilitation. She is encouraged to participate in cardiac rehabilitation.  She has a considerable amount and shoulder pain which she is shooting. This is likely postoperative musculoskeletal strain I even her prescription tramadol 8 hours to help with pain control. She is to follow-up with cardiovascular surgeon   2. Hypertension: Pressure is well-controlled currently will not make any changes in medication regimen at this time   3. Hypercholesterolemia: Will continue atorvastatin therapy 80 mg daily. Follow-up labs in 3 months  Current medicines are reviewed at length with the patient today.    Labs/ tests ordered today include: None  Phill Myron. West Pugh, ANP, AACC     11/10/2017 2:08 PM    [facility] [address] [place], [place] [address] Phone: [phone]; Fax: [phone]

## 2017-11-09 ENCOUNTER — Other Ambulatory Visit: Payer: Self-pay | Admitting: Physician Assistant

## 2017-11-09 DIAGNOSIS — F419 Anxiety disorder, unspecified: Principal | ICD-10-CM

## 2017-11-09 DIAGNOSIS — E119 Type 2 diabetes mellitus without complications: Secondary | ICD-10-CM

## 2017-11-09 DIAGNOSIS — F32A Depression, unspecified: Secondary | ICD-10-CM

## 2017-11-09 DIAGNOSIS — F329 Major depressive disorder, single episode, unspecified: Secondary | ICD-10-CM

## 2017-11-10 ENCOUNTER — Ambulatory Visit (INDEPENDENT_AMBULATORY_CARE_PROVIDER_SITE_OTHER): Payer: Self-pay | Admitting: Adult Health

## 2017-11-10 ENCOUNTER — Encounter: Payer: Self-pay | Admitting: Adult Health

## 2017-11-10 VITALS — BP 132/76 | HR 72 | Ht 66.0 in | Wt 155.0 lb

## 2017-11-10 DIAGNOSIS — I10 Essential (primary) hypertension: Secondary | ICD-10-CM

## 2017-11-10 DIAGNOSIS — I3139 Other pericardial effusion (noninflammatory): Secondary | ICD-10-CM

## 2017-11-10 DIAGNOSIS — I2510 Atherosclerotic heart disease of native coronary artery without angina pectoris: Secondary | ICD-10-CM

## 2017-11-10 DIAGNOSIS — E7800 Pure hypercholesterolemia, unspecified: Secondary | ICD-10-CM

## 2017-11-10 DIAGNOSIS — I313 Pericardial effusion (noninflammatory): Secondary | ICD-10-CM

## 2017-11-10 MED ORDER — TRAMADOL HCL 50 MG PO TABS: 50.0000 mg | ORAL_TABLET | Freq: Three times a day (TID) | ORAL | 0 refills | Status: DC | PRN

## 2017-11-10 NOTE — Patient Instructions (Signed)
Medication Instructions:  NO CHANGES-Your physician recommends that you continue on your current medications as directed. Please refer to the Current Medication list given to you today.  If you need a refill on your cardiac medications before your next appointment, please call your pharmacy.  Special Instructions: CHECK WITH DR Servando Snare TO SEE IF YOU ARE ABLE TO START CARDIAC REHAB  Follow-Up: Your physician wants you to follow-up in: 3 MONTHS WITH DR Stanford Breed    Thank you for choosing [facility]!!

## 2017-11-11 ENCOUNTER — Other Ambulatory Visit: Payer: Self-pay

## 2017-11-11 MED ORDER — AMLODIPINE BESYLATE 5 MG PO TABS: 5.0000 mg | ORAL_TABLET | Freq: Every day | ORAL | 0 refills | Status: DC

## 2017-11-13 ENCOUNTER — Other Ambulatory Visit: Payer: Self-pay | Admitting: Physician Assistant

## 2017-11-16 ENCOUNTER — Other Ambulatory Visit: Payer: Self-pay | Admitting: Physician Assistant

## 2017-11-16 DIAGNOSIS — E119 Type 2 diabetes mellitus without complications: Secondary | ICD-10-CM

## 2017-11-16 NOTE — Telephone Encounter (Signed)
lisinopril refill Last OV: 10/28/27 Last Refill:08/15/17 Pharmacy:[facility]

## 2017-11-17 ENCOUNTER — Ambulatory Visit: Payer: Medicaid Other | Admitting: Adult Health

## 2017-11-20 ENCOUNTER — Other Ambulatory Visit: Payer: Self-pay | Admitting: Cardiothoracic Surgery

## 2017-11-20 DIAGNOSIS — Z951 Presence of aortocoronary bypass graft: Secondary | ICD-10-CM

## 2017-11-23 ENCOUNTER — Ambulatory Visit (INDEPENDENT_AMBULATORY_CARE_PROVIDER_SITE_OTHER): Payer: Self-pay | Admitting: Physician Assistant

## 2017-11-23 ENCOUNTER — Ambulatory Visit
Admission: RE | Admit: 2017-11-23 | Discharge: 2017-11-23 | Disposition: A | Discharge status: Home / Self Care | Payer: BLUE CROSS/BLUE SHIELD | Source: Ambulatory Visit | Attending: Cardiothoracic Surgery | Admitting: Cardiothoracic Surgery

## 2017-11-23 VITALS — BP 118/73 | HR 85 | Resp 20 | Ht 66.0 in | Wt 155.0 lb

## 2017-11-23 DIAGNOSIS — Z951 Presence of aortocoronary bypass graft: Secondary | ICD-10-CM

## 2017-11-23 DIAGNOSIS — J9811 Atelectasis: Secondary | ICD-10-CM | POA: Diagnosis not present

## 2017-11-23 DIAGNOSIS — J90 Pleural effusion, not elsewhere classified: Secondary | ICD-10-CM | POA: Diagnosis not present

## 2017-11-23 MED ORDER — OXYCODONE HCL 5 MG PO TABS: 5.0000 mg | ORAL_TABLET | Freq: Three times a day (TID) | ORAL | 0 refills | Status: DC | PRN

## 2017-11-23 NOTE — Progress Notes (Signed)
HPI: Patient returns for routine postoperative follow-up having undergone CABG x 4 on 10/20/2017.  The patient's early postoperative recovery while in the hospital was as expected.  Since hospital discharge the patient reports she continues to have a lot of discomfort.  She states that she is not walking as much as she should be.  She is however up and doing daily housework including sweeping.  Her weight has been stable.  Her incisions are healing without evidence of infection.  She states the Cardiology office gave her Ultram which is not helping with her pain and she asks if she can get oxycodone instead.   Current Outpatient Medications  Medication Sig Dispense Refill  . amLODipine (NORVASC) 5 MG tablet Take 1 tablet (5 mg total) by mouth daily. 30 tablet 0  . aspirin EC 81 MG EC tablet Take 1 tablet (81 mg total) by mouth daily.    [name] atorvastatin (LIPITOR) 80 MG tablet Take 1 tablet (80 mg total) by mouth daily at 6 PM. 30 tablet 1  . buPROPion (WELLBUTRIN XL) 150 MG 24 hr tablet TAKE ONE TABLET BY MOUTH ONE TIME DAILY 90 tablet 0  . EPINEPHrine (EPIPEN 2-PAK) 0.3 mg/0.3 mL IJ SOAJ injection Inject 0.3 mLs (0.3 mg total) into the muscle once as needed. (Patient taking differently: Inject 0.3 mg into the muscle as needed (allergic reaction). ) 1 Device 1  . FARXIGA 10 MG TABS tablet TAKE ONE TABLET BY MOUTH ONE TIME DAILY 90 tablet 0  . glipiZIDE (GLUCOTROL) 10 MG tablet TAKE ONE TABLET BY MOUTH TWICE A DAY BEFORE A MEAL 180 tablet 0  . lisinopril (PRINIVIL,ZESTRIL) 20 MG tablet Take 1 tablet (20 mg total) by mouth daily. 30 tablet 1  . loratadine (CLARITIN) 10 MG tablet Take 1 tablet (10 mg total) by mouth daily. 90 tablet 3  . metoprolol tartrate (LOPRESSOR) 25 MG tablet Take 1 tablet (25 mg total) by mouth 2 (two) times daily. 180 tablet 1  . Multiple Vitamin (MULTIVITAMIN WITH MINERALS) TABS tablet Take 1 tablet by mouth daily.    . Omega-3 Fatty Acids (FISH OIL CONCENTRATE) 300 MG CAPS Take  1 capsule by mouth daily.    [name] oxyCODONE (OXY IR/ROXICODONE) 5 MG immediate release tablet Take 1 tablet (5 mg total) by mouth every 6 (six) hours as needed for severe pain. 30 tablet 0  . sertraline (ZOLOFT) 50 MG tablet Take 1 tablet (50 mg total) by mouth daily. 90 tablet 1  . traMADol (ULTRAM) 50 MG tablet Take 1 tablet (50 mg total) by mouth every 8 (eight) hours as needed. 30 tablet 0   No current facility-administered medications for this visit.     Physical Exam:  BP 118/73   Pulse 85   Resp 20   Ht 5' 6"  (1.676 m)   Wt 155 lb (70.3 kg)   SpO2 98% Comment: RA  BMI 25.02 kg/m   Gen: no apparent distress Heart; RRR Lungs: CTA bilaterally Abd: soft non tender, non-distended Ext: no edema Incisions: well healed  Diagnostic Tests:  CXR: resolution of previous pleural effusions, no pneumothorax present  A/P;  1. Cv- hemodynamically stable, S/P CABG doing well 2. Pain- patient has not followed sternal precaution since hospital discharge.  This is likely the cause of her post operative pain.  She was again instructed on the importance of observing sternal precautions... I have instructed her to stop using Ultram and she was given a prescription for Oxy IR 5 mg every 8 hours  as needed for pain, Disp 15... She will not be given any further refills 3. Dispo- RTC in 3 months to ensure she continues to progress from surgery and current pain issues have resolved  Ellwood Handler, PA-C [facility] [phone]

## 2017-11-23 NOTE — Patient Instructions (Signed)
You may return to driving an automobile as long as you are no longer requiring oral narcotic pain relievers during the daytime.  It would be wise to start driving only short distances during the daylight and gradually increase from there as you feel comfortable.  Make every effort to stay physically active, get some type of exercise on a regular basis, and stick to a "heart healthy diet".  The long term benefits for regular exercise and a healthy diet are critically important to your overall health and wellbeing.  You may continue to gradually increase your physical activity as tolerated.  Refrain from any heavy lifting or strenuous use of your arms and shoulders until at least 8 weeks from the time of your surgery, and avoid activities that cause increased pain in your chest on the side of your surgical incision.  Otherwise you may continue to increase activities without any particular limitations.  Increase the intensity and duration of physical activity gradually.

## 2017-11-25 ENCOUNTER — Other Ambulatory Visit: Payer: Self-pay

## 2017-11-25 ENCOUNTER — Ambulatory Visit (INDEPENDENT_AMBULATORY_CARE_PROVIDER_SITE_OTHER): Payer: BLUE CROSS/BLUE SHIELD

## 2017-11-25 ENCOUNTER — Ambulatory Visit (INDEPENDENT_AMBULATORY_CARE_PROVIDER_SITE_OTHER): Payer: BLUE CROSS/BLUE SHIELD | Admitting: Emergency Medicine

## 2017-11-25 ENCOUNTER — Encounter: Payer: Self-pay | Admitting: Emergency Medicine

## 2017-11-25 VITALS — BP 113/66 | HR 68 | Temp 98.0°F | Resp 18 | Ht 65.95 in | Wt 154.8 lb

## 2017-11-25 DIAGNOSIS — W19XXXA Unspecified fall, initial encounter: Secondary | ICD-10-CM | POA: Diagnosis not present

## 2017-11-25 DIAGNOSIS — M25561 Pain in right knee: Secondary | ICD-10-CM | POA: Diagnosis not present

## 2017-11-25 DIAGNOSIS — S8001XA Contusion of right knee, initial encounter: Secondary | ICD-10-CM

## 2017-11-25 DIAGNOSIS — T07XXXA Unspecified multiple injuries, initial encounter: Secondary | ICD-10-CM

## 2017-11-25 DIAGNOSIS — S40022A Contusion of left upper arm, initial encounter: Secondary | ICD-10-CM | POA: Diagnosis not present

## 2017-11-25 DIAGNOSIS — Z9889 Other specified postprocedural states: Secondary | ICD-10-CM | POA: Diagnosis not present

## 2017-11-25 DIAGNOSIS — Z48812 Encounter for surgical aftercare following surgery on the circulatory system: Secondary | ICD-10-CM | POA: Diagnosis not present

## 2017-11-25 DIAGNOSIS — S0083XA Contusion of other part of head, initial encounter: Secondary | ICD-10-CM

## 2017-11-25 DIAGNOSIS — S59902A Unspecified injury of left elbow, initial encounter: Secondary | ICD-10-CM | POA: Diagnosis not present

## 2017-11-25 MED ORDER — HYDROCODONE-ACETAMINOPHEN 5-325 MG PO TABS: 1.0000 | ORAL_TABLET | Freq: Four times a day (QID) | ORAL | 0 refills | Status: DC | PRN

## 2017-11-25 NOTE — Patient Instructions (Addendum)
     IF you received an x-ray today, you will receive an invoice from [facility]. Please contact [facility] at [phone] with questions or concerns regarding your invoice.   IF you received labwork today, you will receive an invoice from [facility]. Please contact [facility] at [phone] with questions or concerns regarding your invoice.   Our billing staff will not be able to assist you with questions regarding bills from these companies.  You will be contacted with the lab results as soon as they are available. The fastest way to get your results is to activate your My Chart account. Instructions are located on the last page of this paperwork. If you have not heard from [place] regarding the results in 2 weeks, please contact this office.     Contusion A contusion is a deep bruise. Contusions happen when an injury causes bleeding under the skin. Symptoms of bruising include pain, swelling, and discolored skin. The skin may turn blue, purple, or yellow. Follow these instructions at home:  Rest the injured area.  If told, put ice on the injured area. ? Put ice in a plastic bag. ? Place a towel between your skin and the bag. ? Leave the ice on for 20 minutes, 2-3 times per day.  If told, put light pressure (compression) on the injured area using an elastic bandage. Make sure the bandage is not too tight. Remove it and put it back on as told by your doctor.  If possible, raise (elevate) the injured area above the level of your heart while you are sitting or lying down.  Take over-the-counter and prescription medicines only as told by your doctor. Contact a doctor if:  Your symptoms do not get better after several days of treatment.  Your symptoms get worse.  You have trouble moving the injured area. Get help right away if:  You have very bad pain.  You have a loss of feeling (numbness) in a hand or foot.  Your hand or foot turns pale or cold. This information  is not intended to replace advice given to you by your health care provider. Make sure you discuss any questions you have with your health care provider. Document Released: 02/25/2008 Document Revised: 02/14/2016 Document Reviewed: 01/24/2015 Elsevier Interactive Patient Education  2018 [employer].

## 2017-11-25 NOTE — Progress Notes (Signed)
Victoria Eaton 57 y.o.   Chief Complaint  Patient presents with  . Fall    X - day - has had heart surgery recently, took a bad fall yesterday  . Arm Pain    X 1 day - left arm, right hand and right knee    HISTORY OF PRESENT ILLNESS: This is a 57 y.o. female complaining of tripped and fell forward yesterday.  No direct head injury but hit her chin on the ground.  Denies LOC.  Denies neck injury or pain.  Injured right hand and left arm while attempting to break the fall.  Also injured her right knee.  Complaining of pain to right knee and painful walking.  Left elbow hurting a lot too.  No other significant injuries or symptoms.  Status post open heart surgery last January.  Doing well. HPI   Prior to Admission medications   Medication Sig Start Date End Date Taking? Authorizing Provider  amLODipine (NORVASC) 5 MG tablet Take 1 tablet (5 mg total) by mouth daily. 11/11/17  Yes Shawnee Knapp, MD  aspirin EC 81 MG EC tablet Take 1 tablet (81 mg total) by mouth daily. 10/25/17  Yes Gold, Wayne E, PA-C  atorvastatin (LIPITOR) 80 MG tablet Take 1 tablet (80 mg total) by mouth daily at 6 PM. 10/25/17  Yes Gold, Wilder Glade, PA-C  buPROPion (WELLBUTRIN XL) 150 MG 24 hr tablet TAKE ONE TABLET BY MOUTH ONE TIME DAILY 11/09/17  Yes Tenna Delaine D, PA-C  EPINEPHrine (EPIPEN 2-PAK) 0.3 mg/0.3 mL IJ SOAJ injection Inject 0.3 mLs (0.3 mg total) into the muscle once as needed. Patient taking differently: Inject 0.3 mg into the muscle as needed (allergic reaction).  05/20/17  Yes Timmothy Euler, [place] D, PA-C  FARXIGA 10 MG TABS tablet TAKE ONE TABLET BY MOUTH ONE TIME DAILY 11/16/17  Yes Timmothy Euler, [place] D, PA-C  glipiZIDE (GLUCOTROL) 10 MG tablet TAKE ONE TABLET BY MOUTH TWICE A DAY BEFORE A MEAL 11/09/17  Yes Timmothy Euler, [place] D, PA-C  lisinopril (PRINIVIL,ZESTRIL) 20 MG tablet Take 1 tablet (20 mg total) by mouth daily. 10/25/17  Yes Gold, Wayne E, PA-C  loratadine (CLARITIN) 10 MG tablet Take 1 tablet (10 mg  total) by mouth daily. 05/01/17  Yes Timmothy Euler, [place] D, PA-C  metoprolol tartrate (LOPRESSOR) 25 MG tablet Take 1 tablet (25 mg total) by mouth 2 (two) times daily. 10/26/17  Yes Timmothy Euler, [place] D, PA-C  Multiple Vitamin (MULTIVITAMIN WITH MINERALS) TABS tablet Take 1 tablet by mouth daily.   Yes [provider]  Omega-3 Fatty Acids (FISH OIL CONCENTRATE) 300 MG CAPS Take 1 capsule by mouth daily.   Yes [provider]  oxyCODONE (OXY IR/ROXICODONE) 5 MG immediate release tablet Take 1 tablet (5 mg total) by mouth every 8 (eight) hours as needed for severe pain. 11/23/17  Yes Barrett, Erin R, PA-C  sertraline (ZOLOFT) 50 MG tablet Take 1 tablet (50 mg total) by mouth daily. 09/07/17  Yes Shawnee Knapp, MD    Allergies  Allergen Reactions  . Kiwi Extract Hives  . Shrimp [Shellfish Allergy] Hives  . Penicillins     Reaction as a child     Patient Active Problem List   Diagnosis Date Noted  . S/P CABG x 4 10/19/2017  . Epigastric pain   . Elevated troponin level   . CAD in native artery   . Pancreatitis 10/10/2017  . Pyelonephritis 10/02/2017  . Hyperlipidemia LDL goal <70 09/14/2017  . Type 2 diabetes  mellitus without complication, without long-term current use of insulin ([place]) 05/01/2017  . Essential hypertension 05/01/2017  . Anxiety and depression 05/01/2017  . Tobacco abuse 05/01/2017  . History of MI (myocardial infarction) 05/01/2017  . Allergic rhinitis 05/01/2017  . History of arthritis 05/01/2017  . Bladder prolapse, female, acquired 11/09/2013    Past Medical History:  Diagnosis Date  . Anxiety   . Arthritis    "maybe in my left foot" (10/14/2017)  . Bladder prolapse, female, acquired 09/14/2017  . Depression   . High cholesterol   . History of stomach ulcers   . Hypertension   . Myocardial infarction ([place]) 12/30/2004  . Type II diabetes mellitus ([place])     Past Surgical History:  Procedure Laterality Date  . CARDIAC CATHETERIZATION  10/14/2017   . CORONARY ANGIOPLASTY WITH STENT PLACEMENT  12/30/2004   Patient reported  . CORONARY ARTERY BYPASS GRAFT N/A 10/19/2017   Procedure: CORONARY ARTERY BYPASS GRAFTING (CABG) times four using the right saphaneous vein. Harvested endoscopicly and left internal mammary artery.;  Surgeon: Grace Isaac, MD;  Location: [facility];  Service: Open Heart Surgery;  Laterality: N/A;  . DILATION AND CURETTAGE OF UTERUS  1980s  . LEFT HEART CATH AND CORONARY ANGIOGRAPHY N/A 10/14/2017   Procedure: LEFT HEART CATH AND CORONARY ANGIOGRAPHY;  Surgeon: Martinique, Peter M, MD;  Location: [place] CV [facility];  Service: Cardiovascular;  Laterality: N/A;  . PILONIDAL CYST EXCISION  1980s  . TEE WITHOUT CARDIOVERSION N/A 10/19/2017   Procedure: TRANSESOPHAGEAL ECHOCARDIOGRAM (TEE);  Surgeon: Grace Isaac, MD;  Location: [facility];  Service: Open Heart Surgery;  Laterality: N/A;    Social History   Socioeconomic History  . Marital status: Married    Spouse name: Glendell Docker  . Number of children: 1  . Years of education: Not on file  . Highest education level: Not on file  Social Needs  . Financial resource strain: Not on file  . Food insecurity - worry: Not on file  . Food insecurity - inability: Not on file  . Transportation needs - medical: Not on file  . Transportation needs - non-medical: Not on file  Occupational History  . Not on file  Tobacco Use  . Smoking status: Former Smoker    Packs/day: 0.12    Years: 45.00    Pack years: 5.40    Types: Cigarettes    Start date: 05/01/1972    Last attempt to quit: 09/30/2017    Years since quitting: 0.1  . Smokeless tobacco: Never Used  Substance and Sexual Activity  . Alcohol use: Yes    Alcohol/week: 8.4 oz    Types: 14 Glasses of wine per week  . Drug use: No  . Sexual activity: Not Currently  Other Topics Concern  . Not on file  Social History Narrative  . Not on file    Family History  Problem Relation Age of Onset  . Heart disease Father   .  Hyperlipidemia Father   . Hypertension Father      Review of Systems  Constitutional: Negative.  Negative for chills and fever.  HENT: Negative.  Negative for ear pain, nosebleeds and sore throat.   Eyes: Negative.  Negative for blurred vision, double vision and pain.  Respiratory: Negative.  Negative for hemoptysis and shortness of breath.   Cardiovascular: Negative.  Negative for chest pain and palpitations.  Gastrointestinal: Negative.  Negative for abdominal pain, blood in stool, melena, nausea and vomiting.  Genitourinary: Negative.  Negative for hematuria.  Musculoskeletal: Negative.  Negative for back pain, myalgias and neck pain.  Skin:       Several abrasions  Neurological: Negative.  Negative for dizziness and headaches.  Endo/Heme/Allergies: Negative.   All other systems reviewed and are negative.   Vitals:   11/25/17 1340  BP: 113/66  Pulse: 68  Resp: 18  Temp: 98 F (36.7 C)  SpO2: 99%    Physical Exam  Constitutional: She is oriented to person, place, and time. She appears well-developed and well-nourished.  HENT:  Head: Normocephalic and atraumatic.  Right Ear: External ear normal.  Left Ear: External ear normal.  Nose: Nose normal.  Mouth/Throat: Oropharynx is clear and moist.  Chin: Small abrasion with mild swelling and tenderness.  No findings of mandibular fracture.  Eyes: Conjunctivae and EOM are normal. Pupils are equal, round, and reactive to light.  Neck: Normal range of motion. Neck supple.  Cardiovascular: Normal rate and regular rhythm.  Pulmonary/Chest: Effort normal. No respiratory distress. She exhibits no tenderness.  Chest surgical scar healing well.  Abdominal: Soft. She exhibits no distension. There is no tenderness.  Musculoskeletal:  Right upper extremity: Full range of motion.  Right hand: Positive abrasion, full range of motion. Left upper extremity: Positive tenderness to elbow but maintains full range of motion.  No  bruising. Right knee: Positive abrasion, positive swelling and tenderness.  No significant effusion. Left lower extremity: Within normal limits with full range of motion. Back: No significant findings.  Within normal limits.  Neurological: She is alert and oriented to person, place, and time. No sensory deficit. She exhibits normal muscle tone.  Skin: Capillary refill takes less than 2 seconds.  Positive abrasion to chin, palmar surface of the right hand, right knee.  Psychiatric: She has a normal mood and affect. Her behavior is normal.  Vitals reviewed.  Dg Chest 2 View  Result Date: 11/25/2017 CLINICAL DATA:  Open heart surgery EXAM: CHEST - 2 VIEW COMPARISON:  11/23/2017 FINDINGS: Enlargement of cardiac silhouette post CABG. Atherosclerotic calcification aorta. Mediastinal contours and pulmonary vascularity normal. Lungs clear. No pleural effusion or pneumothorax. Bones demineralized. IMPRESSION: Enlargement of cardiac silhouette post CABG. No acute abnormalities. Electronically Signed   By: Lavonia Dana M.D.   On: 11/25/2017 14:30   Dg Elbow Complete Left (3+view)  Result Date: 11/25/2017 CLINICAL DATA:  56 year old female with left elbow injury after falling yesterday EXAM: LEFT ELBOW - COMPLETE 3+ VIEW COMPARISON:  None. FINDINGS: Small elbow joint effusion as evidenced by elevation of the anterior humeral fat pad. No definite radial head fracture is identified. Mild degenerative change at the olecranon and trochlear interface. Normal bony mineralization. No lytic or blastic osseous lesion. IMPRESSION: Small elbow joint effusion raises suspicion for a radiographically occult radial head fracture. No definite fracture is visualized. Electronically Signed   By: Jacqulynn Cadet M.D.   On: 11/25/2017 14:47   Dg Knee Complete 4 Views Right  Result Date: 11/25/2017 CLINICAL DATA:  Right knee pain after fall yesterday. EXAM: RIGHT KNEE - COMPLETE 4+ VIEW COMPARISON:  None. FINDINGS: No evidence of  fracture, dislocation, or joint effusion. Mild narrowing of medial joint space is noted. Soft tissues are unremarkable. IMPRESSION: Mild degenerative joint disease is noted medially. No acute abnormality seen in the right knee. Electronically Signed   By: Marijo Conception, M.D.   On: 11/25/2017 14:32     ASSESSMENT & PLAN: Remedios was seen today for fall, arm pain and depression.  Diagnoses  and all orders for this visit:  Multiple contusions -     HYDROcodone-acetaminophen (NORCO) 5-325 MG tablet; Take 1 tablet by mouth every 6 (six) hours as needed for moderate pain.  Accidental fall, initial encounter  Arm contusion, left, initial encounter -     DG ELBOW COMPLETE LEFT (3+VIEW); Future -     Sling  Contusion of right knee, initial encounter -     DG Knee Complete 4 Views Right; Future  Contusion of chin, initial encounter  History of open heart surgery -     DG Chest 2 View; Future    Patient Instructions       IF you received an x-ray today, you will receive an invoice from [facility] Radiology. Please contact [facility] at [phone] with questions or concerns regarding your invoice.   IF you received labwork today, you will receive an invoice from [facility]. Please contact [facility] at [phone] with questions or concerns regarding your invoice.   Our billing staff will not be able to assist you with questions regarding bills from these companies.  You will be contacted with the lab results as soon as they are available. The fastest way to get your results is to activate your My Chart account. Instructions are located on the last page of this paperwork. If you have not heard from [place] regarding the results in 2 weeks, please contact this office.     Contusion A contusion is a deep bruise. Contusions happen when an injury causes bleeding under the skin. Symptoms of bruising include pain, swelling, and discolored skin. The skin may turn blue, purple, or  yellow. Follow these instructions at home:  Rest the injured area.  If told, put ice on the injured area. ? Put ice in a plastic bag. ? Place a towel between your skin and the bag. ? Leave the ice on for 20 minutes, 2-3 times per day.  If told, put light pressure (compression) on the injured area using an elastic bandage. Make sure the bandage is not too tight. Remove it and put it back on as told by your doctor.  If possible, raise (elevate) the injured area above the level of your heart while you are sitting or lying down.  Take over-the-counter and prescription medicines only as told by your doctor. Contact a doctor if:  Your symptoms do not get better after several days of treatment.  Your symptoms get worse.  You have trouble moving the injured area. Get help right away if:  You have very bad pain.  You have a loss of feeling (numbness) in a hand or foot.  Your hand or foot turns pale or cold. This information is not intended to replace advice given to you by your health care provider. Make sure you discuss any questions you have with your health care provider. Document Released: 02/25/2008 Document Revised: 02/14/2016 Document Reviewed: 01/24/2015 Elsevier Interactive Patient Education  2018 [employer]      Agustina Caroli, MD [facility]

## 2017-12-04 ENCOUNTER — Other Ambulatory Visit: Payer: Self-pay | Admitting: Family Medicine

## 2017-12-05 ENCOUNTER — Other Ambulatory Visit: Payer: Self-pay | Admitting: Family Medicine

## 2017-12-07 ENCOUNTER — Ambulatory Visit (INDEPENDENT_AMBULATORY_CARE_PROVIDER_SITE_OTHER): Payer: BLUE CROSS/BLUE SHIELD

## 2017-12-07 ENCOUNTER — Encounter: Payer: Self-pay | Admitting: Family Medicine

## 2017-12-07 ENCOUNTER — Other Ambulatory Visit: Payer: Self-pay

## 2017-12-07 ENCOUNTER — Ambulatory Visit (INDEPENDENT_AMBULATORY_CARE_PROVIDER_SITE_OTHER): Payer: BLUE CROSS/BLUE SHIELD | Admitting: Family Medicine

## 2017-12-07 VITALS — BP 120/86 | HR 83 | Temp 98.5°F | Resp 18 | Ht 65.95 in | Wt 158.0 lb

## 2017-12-07 DIAGNOSIS — W19XXXA Unspecified fall, initial encounter: Secondary | ICD-10-CM

## 2017-12-07 DIAGNOSIS — Z9889 Other specified postprocedural states: Secondary | ICD-10-CM

## 2017-12-07 DIAGNOSIS — M79602 Pain in left arm: Secondary | ICD-10-CM

## 2017-12-07 DIAGNOSIS — F32A Depression, unspecified: Secondary | ICD-10-CM

## 2017-12-07 DIAGNOSIS — I10 Essential (primary) hypertension: Secondary | ICD-10-CM

## 2017-12-07 DIAGNOSIS — S59902D Unspecified injury of left elbow, subsequent encounter: Secondary | ICD-10-CM

## 2017-12-07 DIAGNOSIS — T07XXXA Unspecified multiple injuries, initial encounter: Secondary | ICD-10-CM | POA: Diagnosis not present

## 2017-12-07 DIAGNOSIS — M79622 Pain in left upper arm: Secondary | ICD-10-CM | POA: Diagnosis not present

## 2017-12-07 DIAGNOSIS — F4321 Adjustment disorder with depressed mood: Secondary | ICD-10-CM

## 2017-12-07 DIAGNOSIS — Z1231 Encounter for screening mammogram for malignant neoplasm of breast: Secondary | ICD-10-CM | POA: Diagnosis not present

## 2017-12-07 DIAGNOSIS — S4992XD Unspecified injury of left shoulder and upper arm, subsequent encounter: Secondary | ICD-10-CM

## 2017-12-07 DIAGNOSIS — M79632 Pain in left forearm: Secondary | ICD-10-CM | POA: Diagnosis not present

## 2017-12-07 DIAGNOSIS — K851 Biliary acute pancreatitis without necrosis or infection: Secondary | ICD-10-CM

## 2017-12-07 DIAGNOSIS — F419 Anxiety disorder, unspecified: Secondary | ICD-10-CM

## 2017-12-07 DIAGNOSIS — F329 Major depressive disorder, single episode, unspecified: Secondary | ICD-10-CM

## 2017-12-07 DIAGNOSIS — E1165 Type 2 diabetes mellitus with hyperglycemia: Secondary | ICD-10-CM

## 2017-12-07 DIAGNOSIS — S59912D Unspecified injury of left forearm, subsequent encounter: Secondary | ICD-10-CM

## 2017-12-07 DIAGNOSIS — E785 Hyperlipidemia, unspecified: Secondary | ICD-10-CM | POA: Diagnosis not present

## 2017-12-07 DIAGNOSIS — E119 Type 2 diabetes mellitus without complications: Secondary | ICD-10-CM | POA: Diagnosis not present

## 2017-12-07 DIAGNOSIS — S59912A Unspecified injury of left forearm, initial encounter: Secondary | ICD-10-CM | POA: Diagnosis not present

## 2017-12-07 DIAGNOSIS — Z1239 Encounter for other screening for malignant neoplasm of breast: Secondary | ICD-10-CM

## 2017-12-07 DIAGNOSIS — Z1159 Encounter for screening for other viral diseases: Secondary | ICD-10-CM

## 2017-12-07 DIAGNOSIS — S4992XA Unspecified injury of left shoulder and upper arm, initial encounter: Secondary | ICD-10-CM | POA: Diagnosis not present

## 2017-12-07 DIAGNOSIS — F17200 Nicotine dependence, unspecified, uncomplicated: Secondary | ICD-10-CM | POA: Diagnosis not present

## 2017-12-07 LAB — POCT URINALYSIS DIP (MANUAL ENTRY)
BILIRUBIN UA: NEGATIVE
Ketones, POC UA: NEGATIVE mg/dL
Leukocytes, UA: NEGATIVE
Nitrite, UA: NEGATIVE
Protein Ur, POC: NEGATIVE mg/dL
RBC UA: NEGATIVE
SPEC GRAV UA: 1.01 (ref 1.010–1.025)
Urobilinogen, UA: 0.2 E.U./dL
pH, UA: 5.5 (ref 5.0–8.0)

## 2017-12-07 LAB — POC MICROSCOPIC URINALYSIS (UMFC): MUCUS RE: ABSENT

## 2017-12-07 LAB — POCT GLYCOSYLATED HEMOGLOBIN (HGB A1C): HEMOGLOBIN A1C: 8.6

## 2017-12-07 MED ORDER — PEN NEEDLES 32G X 4 MM MISC: 1.0000 [IU] | Freq: Every day | 11 refills | Status: DC

## 2017-12-07 MED ORDER — AMLODIPINE BESYLATE 5 MG PO TABS: 5.0000 mg | ORAL_TABLET | Freq: Every day | ORAL | 1 refills | Status: DC

## 2017-12-07 MED ORDER — LISINOPRIL 20 MG PO TABS: 20.0000 mg | ORAL_TABLET | Freq: Every day | ORAL | 1 refills | Status: DC

## 2017-12-07 MED ORDER — ATORVASTATIN CALCIUM 80 MG PO TABS: 80.0000 mg | ORAL_TABLET | Freq: Every day | ORAL | 1 refills | Status: DC

## 2017-12-07 MED ORDER — INSULIN GLARGINE 100 UNIT/ML SOLOSTAR PEN: 10.0000 [IU] | PEN_INJECTOR | Freq: Every day | SUBCUTANEOUS | 0 refills | Status: DC

## 2017-12-07 NOTE — Progress Notes (Signed)
Met with patient at the request of Dr. Brigitte Pulse. Patient would like instruction on how to use insulin pen. Explained use of pen to patient. Patient able to demonstrate to RN proper use of insulin pen. Invited to return to clinic/call for any additional teaching if needed, patient verbalizes understanding.

## 2017-12-07 NOTE — Progress Notes (Signed)
Subjective:  By signing my name below, I, Moises Blood, attest that this documentation has been prepared under the direction and in the presence of [name], MD. Electronically Signed: Moises Blood, [place]. 12/07/2017 , 10:49 AM .  Patient was seen in Room 3 .   Patient ID: Victoria Eaton, female    DOB: 02-04-1961, 57 y.o.   MRN: [id number] Chief Complaint  Patient presents with  . Diabetes    Pt states sugars have been running high around the 200s. Pt states she doesn't check sugar everyday.  . Mood    Depression scale score 20  . Follow-up   HPI Victoria Eaton is a 57 y.o. female who presents to Primary Care at [facility] for follow up. I first met patient at visit 3 months ago. Since then, she's been hospitalized o/n for pyelonephritis, followed by an >2-week hospitalization for acute pancreatitis AND NSTEMI so then underwent 4-vessel CABG during that hosp. She was seen by [facility] for her hosp d/c f/u/transition of care visit.  She was seen by cardiology NP 1 month ago and cardiothoracic PA 2 weeks ago. She was noted to have sternal pain as not compliant with post-op restrictions.   She hasn't been doing much activities with less walking due to weather; plans to increase with improving weather. She plans to make an appointment with cardiac rehab.   Two weeks previously, pt saw Dr. Mitchel Honour the day after she suffered a fall at home, resulting in multiple MSK injuries/contusions inc rib.   She hasn't scheduled appointment with general surgeon.    She still smokes but much less, due to her husband still smokes. She notes smoking about 0-5 times a day, more so when she's feeling more stressed or depressed but this is a significant cut down for her and she doesn't feel that she can do any better while she is so stressed out and bored w/ activity limitations since her sternotomy/CABG and doubts she will ever be able to stop entirely while her husband is still smoking. They do not smoke  in the house.  Diabetes Patient is on farxiga 10 and glipizide 10 BID and states good med compliance. She did not tolerate metformin, as secondary to severe diarrhea, which prohibited her from working. She was started on Januvia at last visit but was stopped during hosp as developed pancreatitis 1 month later. However, they did think her pancreatitis was gall stone induced, as these were noted on imaging as her AlkPhos was mildly elevated.  Hosp notes do not mention anywhere that there was concerned it was drug-induced.  Her sugars have been running really high. Fasting sugars yesterday was over 350, and an hour after medications, her sugar improved slightly to 250 < x < 350. She notes her sugar usually runs throughout the 200s, typically checks first thing in the morning, but doesn't check daily.  She denies hypoglycemic symptoms. She mentions diabetes medications held in hospitalization and was only given insulin with dramatically better cbg control.   She hasn't seen her eye doctor recently, usually sees eye doctor at [facility]. She is due to mammogram.   Left arm pain She reports falling on March 5th, and was seen on the 6th by colleague Dr. Mitchel Honour. She had xray done of R knee, L elbow, and CXR, and was informed there weren't any breaks seen, but possible elbow hairline fracture. She complains her left elbow and her left forearm is still having a lot of pain, as well as sig  pain and range limitations even when just flipping over her hand/arm. She rates pain about 6-7/10 and notes it being constant. She is able to move her arm, but has some weakness throughout left arm and hand with decreased grip strength.   Feeling very depressed because she can't do anything while recovering from her CABG - is on lifting/activity restricitons so can't garden which is normally her hobby. Things need done around the house but she can't. Can't eat good foods as DM worse, can't even start on exercise yet until  cardiac rehab, not been released back to work so $$ tight, is having to cut back on smoking which helped w/ her stress.  She doesn't think there is anything/lifestyle changes that could improve her mood/lifestyle currently.  Past Medical History:  Diagnosis Date  . Anxiety   . Arthritis    "maybe in my left foot" (10/14/2017)  . Bladder prolapse, female, acquired 09/14/2017  . Depression   . High cholesterol   . History of stomach ulcers   . Hypertension   . Myocardial infarction ([place]) 12/30/2004  . Type II diabetes mellitus ([place])    Past Surgical History:  Procedure Laterality Date  . CARDIAC CATHETERIZATION  10/14/2017  . CORONARY ANGIOPLASTY WITH STENT PLACEMENT  12/30/2004   Patient reported  . CORONARY ARTERY BYPASS GRAFT N/A 10/19/2017   Procedure: CORONARY ARTERY BYPASS GRAFTING (CABG) times four using the right saphaneous vein. Harvested endoscopicly and left internal mammary artery.;  Surgeon: Grace Isaac, MD;  Location: [facility];  Service: Open Heart Surgery;  Laterality: N/A;  . DILATION AND CURETTAGE OF UTERUS  1980s  . LEFT HEART CATH AND CORONARY ANGIOGRAPHY N/A 10/14/2017   Procedure: LEFT HEART CATH AND CORONARY ANGIOGRAPHY;  Surgeon: Martinique, Peter M, MD;  Location: [facility];  Service: Cardiovascular;  Laterality: N/A;  . PILONIDAL CYST EXCISION  1980s  . TEE WITHOUT CARDIOVERSION N/A 10/19/2017   Procedure: TRANSESOPHAGEAL ECHOCARDIOGRAM (TEE);  Surgeon: Grace Isaac, MD;  Location: [facility];  Service: Open Heart Surgery;  Laterality: N/A;   Prior to Admission medications   Medication Sig Start Date End Date Taking? Authorizing Provider  amLODipine (NORVASC) 5 MG tablet Take 1 tablet (5 mg total) by mouth daily. 11/11/17   Shawnee Knapp, MD  aspirin EC 81 MG EC tablet Take 1 tablet (81 mg total) by mouth daily. 10/25/17   Gold, Wilder Glade, PA-C  atorvastatin (LIPITOR) 80 MG tablet Take 1 tablet (80 mg total) by mouth daily at 6 PM. 10/25/17   Gold, Wilder Glade, PA-C    buPROPion (WELLBUTRIN XL) 150 MG 24 hr tablet TAKE ONE TABLET BY MOUTH ONE TIME DAILY 11/09/17   Tenna Delaine D, PA-C  EPINEPHrine (EPIPEN 2-PAK) 0.3 mg/0.3 mL IJ SOAJ injection Inject 0.3 mLs (0.3 mg total) into the muscle once as needed. Patient taking differently: Inject 0.3 mg into the muscle as needed (allergic reaction).  05/20/17   Tenna Delaine D, PA-C  FARXIGA 10 MG TABS tablet TAKE ONE TABLET BY MOUTH ONE TIME DAILY 11/16/17   Tenna Delaine D, PA-C  glipiZIDE (GLUCOTROL) 10 MG tablet TAKE ONE TABLET BY MOUTH TWICE A DAY BEFORE A MEAL 11/09/17   Tenna Delaine D, PA-C  HYDROcodone-acetaminophen (NORCO) 5-325 MG tablet Take 1 tablet by mouth every 6 (six) hours as needed for moderate pain. 11/25/17   Horald Pollen, MD  lisinopril (PRINIVIL,ZESTRIL) 20 MG tablet Take 1 tablet (20 mg total) by mouth daily. 10/25/17  Gold, Wayne E, PA-C  loratadine (CLARITIN) 10 MG tablet Take 1 tablet (10 mg total) by mouth daily. 05/01/17   Tenna Delaine D, PA-C  metoprolol tartrate (LOPRESSOR) 25 MG tablet Take 1 tablet (25 mg total) by mouth 2 (two) times daily. 10/26/17   Tenna Delaine D, PA-C  Multiple Vitamin (MULTIVITAMIN WITH MINERALS) TABS tablet Take 1 tablet by mouth daily.    [provider]  Omega-3 Fatty Acids (FISH OIL CONCENTRATE) 300 MG CAPS Take 1 capsule by mouth daily.    [provider]  oxyCODONE (OXY IR/ROXICODONE) 5 MG immediate release tablet Take 1 tablet (5 mg total) by mouth every 8 (eight) hours as needed for severe pain. 11/23/17   Barrett, Erin R, PA-C  sertraline (ZOLOFT) 50 MG tablet Take 1 tablet (50 mg total) by mouth daily. 09/07/17   Shawnee Knapp, MD  sertraline (ZOLOFT) 50 MG tablet TAKE ONE TABLET BY MOUTH ONE TIME DAILY 12/04/17   Shawnee Knapp, MD   Allergies  Allergen Reactions  . Kiwi Extract Hives  . Shrimp [Shellfish Allergy] Hives  . Penicillins     Reaction as a child    Family History  Problem Relation Age of Onset  .  Heart disease Father   . Hyperlipidemia Father   . Hypertension Father    Social History   Socioeconomic History  . Marital status: Married    Spouse name: Glendell Docker  . Number of children: 1  . Years of education: None  . Highest education level: None  Social Needs  . Financial resource strain: None  . Food insecurity - worry: None  . Food insecurity - inability: None  . Transportation needs - medical: None  . Transportation needs - non-medical: None  Occupational History  . None  Tobacco Use  . Smoking status: Former Smoker    Packs/day: 0.12    Years: 45.00    Pack years: 5.40    Types: Cigarettes    Start date: 05/01/1972    Last attempt to quit: 09/30/2017    Years since quitting: 0.1  . Smokeless tobacco: Never Used  Substance and Sexual Activity  . Alcohol use: Yes    Alcohol/week: 8.4 oz    Types: 14 Glasses of wine per week  . Drug use: No  . Sexual activity: Not Currently  Other Topics Concern  . None  Social History Narrative  . None   Depression screen [facility] 2/9 12/07/2017 11/25/2017 10/27/2017 05/20/2017 05/01/2017  Decreased Interest 3 0 2 2 3   Down, Depressed, Hopeless 3 1 1 1 1   PHQ - 2 Score 6 1 3 3 4   Altered sleeping 1 0 1 1 0  Tired, decreased energy 3 3 3 2 3   Change in appetite 2 0 3 0 0  Feeling bad or failure about yourself  2 0 2 3 3   Trouble concentrating 3 2 3 1  0  Moving slowly or fidgety/restless 3 0 2 1 0  Suicidal thoughts 0 0 0 0 0  PHQ-9 Score 20 6 17 11 10   Difficult doing work/chores Somewhat difficult - - Somewhat difficult -    Review of Systems  Constitutional: Negative for fatigue and unexpected weight change.  Respiratory: Negative for chest tightness and shortness of breath.   Cardiovascular: Negative for chest pain, palpitations and leg swelling.  Gastrointestinal: Negative for abdominal pain and blood in stool.  Musculoskeletal: Positive for arthralgias and myalgias.  Neurological: Positive for weakness. Negative for dizziness,  syncope, light-headedness  and headaches.       Objective:   Physical Exam  Constitutional: She is oriented to person, place, and time. She appears well-developed and well-nourished. No distress.  HENT:  Head: Normocephalic and atraumatic.  Eyes: EOM are normal. Pupils are equal, round, and reactive to light.  Neck: Neck supple.  Cardiovascular: Normal rate.  Pulmonary/Chest: Effort normal. No respiratory distress.  Musculoskeletal: Normal range of motion.  Left arm: moderate restriction in wrist extension and flexion; mild restriction in elbow extension and supination, moderate restriction in pronation and elbow flexion; cannot abduct elbow past 90 degrees, full adduction, tenderness over the distal humerus, severe tenderness over proximal radial ulnar  Neurological: She is alert and oriented to person, place, and time.  Skin: Skin is warm and dry.  Psychiatric: She has a normal mood and affect. Her behavior is normal.  Nursing note and vitals reviewed.   BP 120/86 (BP Location: Right Arm, Patient Position: Sitting, Cuff Size: Normal)   Pulse 83   Temp 98.5 F (36.9 C) (Oral)   Resp 18   Ht 5' 5.95" (1.675 m)   Wt 158 lb (71.7 kg)   SpO2 100%   BMI 25.54 kg/m   Dg Forearm Left  Result Date: 12/07/2017 CLINICAL DATA:  Pain following fall EXAM: LEFT FOREARM - 2 VIEW COMPARISON:  None. FINDINGS: Frontal and lateral views were obtained. No fracture or dislocation. Joint spaces appear normal. No erosive change. IMPRESSION: No fracture or dislocation.  No appreciable arthropathy. Electronically Signed   By: Lowella Grip III M.D.   On: 12/07/2017 11:09   Dg Humerus Left  Result Date: 12/07/2017 CLINICAL DATA:  Pain following fall 2 weeks prior EXAM: LEFT HUMERUS - 2+ VIEW COMPARISON:  None. FINDINGS: Frontal and lateral views were obtained. No fracture or dislocation. Joint spaces appear normal. No erosive change. IMPRESSION: No fracture or dislocation.  No evident arthropathy.  Electronically Signed   By: Lowella Grip III M.D.   On: 12/07/2017 11:08    Results for orders placed or performed in visit on 12/07/17  POCT glycosylated hemoglobin (Hb A1C)  Result Value Ref Range   Hemoglobin A1C 8.6   POCT urinalysis dipstick  Result Value Ref Range   Color, UA yellow yellow   Clarity, UA clear clear   Glucose, UA =500 (A) negative mg/dL   Bilirubin, UA negative negative   Ketones, POC UA negative negative mg/dL   Spec Grav, UA 1.010 1.010 - 1.025   Blood, UA negative negative   pH, UA 5.5 5.0 - 8.0   Protein Ur, POC negative negative mg/dL   Urobilinogen, UA 0.2 0.2 or 1.0 E.U./dL   Nitrite, UA Negative Negative   Leukocytes, UA Negative Negative  POCT Microscopic Urinalysis ([facility])  Result Value Ref Range   WBC,UR,HPF,POC None None WBC/hpf   RBC,UR,HPF,POC None None RBC/hpf   Bacteria None None, Too numerous to count   Mucus Absent Absent   Epithelial Cells, UR Per Microscopy None None, Too numerous to count cells/hpf       Assessment & Plan:   1. Type 2 diabetes mellitus with hyperglycemia, without long-term current use of insulin ([place])  - reminded to resched her DM ophtho exam with [facility] - last appt 05/2017 - she had a f/u appt there 2 mos ago which she cancelled, last appt  Needs pneumovax-23 at next OV. Uncontrolled. Maxed on Farxiga, could increase glipizide from 10 bid to 20 bid but there is no way that would be potent enough  to control cbgs. Pt w/o any motivation to work on tlc (I think her lack of desire/willingness to try diet/exercise intervention due to adjustment d/o w/ depression from MI and 4 vessel cabg 56mo prior). Can't tol metformin 2/2 diarrhea.  Now DPP4 and GLP-1 contraindicated due to pancreatitis 1 mo after starting Januvia. NEW INSULIN START TODAY - 10u Lantus - had LParamedicteach pt how to use insulin pen - pt was very trepidatious/overwhelmed w/ the idea of starting insulin but admits it worked very well for her when she was  kept on it during her 2 wk hosp.  Start lantus 10u x 3d, then increase by 2u every 3d until fasting a.m. cbgs <150 - then stay at that dose until next OV.  Reviewed sxs/trx of hypoglycemia when on 24 hrs basal insulin. Stop SU. Cont farxiga. RECHECK IN 1 MO SINCE NEW INSULIN START  2. Need for hepatitis C screening test   3. Screening for breast cancer - reminded pt to sched mam  4. Accidental fall, initial encounter   5. Forearm injury, left, subsequent encounter   6. Arm pain, central, left   7. Elbow injury, left, subsequent encounter  - L elbow xray at initial eval for inj 12d prior notes poss radiographically occult radial head frx due to small joint effusion - recheck xrays today - left humerous and forearm are normal and reads to do not make any note of radial head area at all - just say "normal joint spaces." However, sxs/exam is consistent w/ prox radial head injury/occult non-displaced/type 1 frx so cont RICE, start gentle home rehab exercises given, and avoid aggressive activities that induce pain inc lifting w/ left arm.  Advised not splinting/sling since injury 166dold and would increase risk of dev frozen joint.  8. Multiple contusions   9. Hyperlipidemia LDL goal <70 - cont lipitor 80 - lipids at goal during hosp 09/2017  10. Adjustment disorder with depressed mood - advised pt that just 2 mos prior she had 2 separate emergent medical complications each of which could easily have been life-threatening w/o the prompt trx she sought and aggressive medical interventions provided and that it is not uncommon to have a depression rxn after such severe unexpected health complications but pts have better outcomes if the reactionary mood d/o is acknowledged and trx'd. Strongly encouraged pt to make herself do activities she enjoys - get outside, start doing minor gardening- little pots of herbs/seeds/flower boxes with her husband to move the dirt, lifting.  Cont sertraline 50 and wellbutrin xl 150 -  pt declines increased dose currently.  11. Tobacco use disorder -on wellbutrin xl 150 qam reinforced importance of cessation to avoid having a second MI - would be a shame to make undergoing the 4 vessel cabg pointless by killing herself through smoking - pt pre-contemplative on quitting entirely. Has BCBS ins who do not cover the annual lower dose screening lung CT  12. History of open heart surgery - cont statin, asa, BB, acei  13. Anxiety and depression - cont sertraline 50 and wellbutrin 150 xl  14. Acute biliary pancreatitis without infection or necrosis - pt was instructed on hosp d/c to sched o/p f/u w/ gen surg to discuss poss cholecystectomy to avoid poss of recurrent episode of biliary pancreatitis. Pt not sched appt yet since would not be given cardiac cleared for any potentional surgery until undergoes cardiac rehab - likely minimum of 6 mos post-op  15. Essential hypertension - well controlled -  cont current regimen of amlodipine 5, lisinopril 20, metoprolol 25 bid      Orders Placed This Encounter  Procedures  . MM Digital Screening    Standing Status:   Future    Standing Expiration Date:   02/07/2019    Order Specific Question:   Reason for Exam (SYMPTOM  OR DIAGNOSIS REQUIRED)    Answer:   screening    Order Specific Question:   Is the patient pregnant?    Answer:   No    Order Specific Question:   Preferred imaging location?    Answer:   [facility]  . DG Humerus Left    Standing Status:   Future    Number of Occurrences:   1    Standing Expiration Date:   12/07/2018    Order Specific Question:   Reason for Exam (SYMPTOM  OR DIAGNOSIS REQUIRED)    Answer:   Fall x2 week, point tenderness over distal humerus prox radius. Motion limited.    Order Specific Question:   Is the patient pregnant?    Answer:   No    Order Specific Question:   Preferred imaging location?    Answer:   [facility]  . DG Forearm Left    Standing Status:   Future    Number of Occurrences:   1     Standing Expiration Date:   12/07/2018    Order Specific Question:   Reason for Exam (SYMPTOM  OR DIAGNOSIS REQUIRED)    Answer:   Fall x2 week, point tenderness over distal humerus prox radius. Motion limited.    Order Specific Question:   Is the patient pregnant?    Answer:   No    Order Specific Question:   Preferred imaging location?    Answer:   [facility]  . Comprehensive metabolic panel  . HCV Ab w/Rflx to Verification  . TSH  . Insulin, random  . TSH  . Insulin, random  . Interpretation:  . Ambulatory referral to Ophthalmology    Referral Priority:   Routine    Referral Type:   Consultation    Referral Reason:   Specialty Services Required    Requested Specialty:   Ophthalmology    Number of Visits Requested:   1  . POCT glycosylated hemoglobin (Hb A1C)  . POCT urinalysis dipstick  . POCT Microscopic Urinalysis ([facility])    Meds ordered this encounter  Medications  . DISCONTD: atorvastatin (LIPITOR) 80 MG tablet    Sig: Take 1 tablet (80 mg total) by mouth daily at 6 PM.    Dispense:  30 tablet    Refill:  1  . atorvastatin (LIPITOR) 80 MG tablet    Sig: Take 1 tablet (80 mg total) by mouth daily at 6 PM.    Dispense:  90 tablet    Refill:  1  . amLODipine (NORVASC) 5 MG tablet    Sig: Take 1 tablet (5 mg total) by mouth daily.    Dispense:  90 tablet    Refill:  1  . lisinopril (PRINIVIL,ZESTRIL) 20 MG tablet    Sig: Take 1 tablet (20 mg total) by mouth daily.    Dispense:  90 tablet    Refill:  1  . Insulin Glargine (LANTUS SOLOSTAR) 100 UNIT/ML Solostar Pen    Sig: Inject 10 Units into the skin daily at 10 pm. Increase dose by 2u every 3 days until fasting a.m. cbgs <150    Dispense:  5 pen  Refill:  0  . Insulin Pen Needle (PEN NEEDLES) 32G X 4 MM MISC    Sig: 1 Units by Does not apply route daily.    Dispense:  100 each    Refill:  11    I personally performed the services described in this documentation, which was scribed in my presence. The recorded  information has been reviewed and considered, and addended by me as needed.   [name], M.D.  Primary Care at [facility] [address] [place], [place] [address] [phone] phone [phone] fax  12/16/17 9:19 AM

## 2017-12-07 NOTE — Patient Instructions (Addendum)
Please call Victoria Eaton at [phone] to schedule your mammogram.  We recommend that you schedule a mammogram for breast cancer screening. Typically, you do not need a referral to do this. Please contact a local imaging center to schedule your mammogram.  [facility] - [phone]  *ask for the Radiology Department [facility]) - [phone] or [phone]  [facility] - [phone] [place] [phone] [facility] - [phone]  *ask for the [facility] - [phone]  *ask for the Radiology Department [facility] - [phone]  *ask for the [facility] - [phone]  Call to schedule you appointment with your eye doctor.  IF you received an x-ray today, you will receive an invoice from [facility]. Please contact [facility] at [phone] with questions or concerns regarding your invoice.   IF you received labwork today, you will receive an invoice from [place]. Please contact [facility] at [phone] with questions or concerns regarding your invoice.   Our billing staff will not be able to assist you with questions regarding bills from these companies.  You will be contacted with the lab results as soon as they are available. The fastest way to get your results is to activate your My Chart account. Instructions are located on the last page of this paperwork. If you have not heard from [place] regarding the results in 2 weeks, please contact this office.     Insulin Injection Instructions, Using Insulin Pens, Adult A subcutaneous injection is a shot of medicine that is injected into the layer of fat between skin and muscle. People with type 1 diabetes must take insulin because their bodies do not make it. People with type 2 diabetes may need to take insulin. There are many  different types of insulin. The type of insulin that you take may determine how many injections you give yourself and when you need to take the injections. Choosing a site for injection Insulin absorption varies from site to site. It is best to inject insulin within the same body area, using a different spot in that area for each injection. Do not inject the insulin in the same spot for each injection. There are five main areas that can be used for injecting. These areas include:  Abdomen. This is the preferred area.  Front of thigh.  Upper, outer side of thigh.  Back of upper arm.  Buttocks.  Using an insulin pen First, follow the steps for Getting Ready, then continue with the steps for Injecting the Insulin. Getting Ready 1. Wash your hands with soap and water. If soap and water are not available, use hand sanitizer. 2. Check the expiration date and type of insulin in the pen. 3. If you are using CLEAR insulin, check to see that it is clear and free of clumps. 4. If you are using CLOUDY insulin, gently roll the pen between your palms several times, or tip the pen up and down several times to mix up the medicine. Do not shake the pen. 5. Remove the cap from the insulin pen. 6. Use an alcohol wipe to clean the rubber stopper of the pen cartridge. 7. Remove the protective paper tab from the disposable needle. Do not let the needle touch anything. 8. Screw the needle onto the pen. 9. Remove the outer and inner plastic covers from the needle. Do not throw away the outer  plastic cover yet. 10. Prime the insulin pen by turning the button (dial) to 2 units. Hold the pen with the needle pointing up, and push the button on the opposite end of the pen until a drop of insulin appears at the needle tip. If no insulin appears, repeat this step. 11. Dial the number of units of insulin that you will be injecting. Injecting the Insulin  1. Use an alcohol wipe to clean the site where you will be  injecting the needle. Let the site air-dry. 2. Hold the pen in the palm of your writing hand with your thumb on the top. 3. If directed by your health care provider, use your other hand to pinch and hold about an inch of skin at the injection site. Do not directly touch the cleaned part of the skin. 4. Gently but quickly, put the needle straight into the skin. The needle should be at a 90-degree angle (perpendicular) to the skin, as if to form the letter "L." ? For example, if you are giving an injection in the abdomen, the abdomen forms one "leg" of the "L" and the needle forms the other "leg" of the "L." 5. For adults who have a small amount of body fat, the needle may need to be injected at a 45-degree angle instead. Your health care provider will tell you if this is necessary. ? A 45-degree angle looks like the letter "V." 6. When the needle is completely inserted into the skin, use the thumb of your writing hand to push the top button of the pen down all the way to inject the insulin. 7. Let go of the skin that you are pinching. Continue to hold the pen in place with your writing hand. 8. Wait five seconds, then pull the needle straight out of the skin. 9. Carefully put the larger (outer) plastic cover of the needle back over the needle, then unscrew the capped needle and discard it in a sharps container, such as an empty plastic bottle with a cover. 10. Put the plastic cap back on the insulin pen. Throwing away supplies  Discard all used needles in a puncture-proof sharps disposal container. You can ask your local pharmacy about where you can get this kind of disposal container, or you can use an empty liquid laundry detergent bottle that has a cover.  Follow the disposal regulations for the area where you live. Do not use any needle more than one time.  Throw away empty disposable pens in the regular trash. What questions should I ask my health care provider?  How often should I be taking  insulin?  How often should I check my blood glucose?  What amount of insulin should I be taking at each time?  What are the side effects?  What should I do if my blood glucose is too high?  What should I do if my blood glucose is too low?  What should I do if I forget to take my insulin?  What number should I call if I have questions? Where can I get more information?  American Diabetes Association (ADA): www.diabetes.org  American Association of Diabetes Educators (AADE) Patient Resources: https://www.diabeteseducator.org/patient-resources This information is not intended to replace advice given to you by your health care provider. Make sure you discuss any questions you have with your health care provider. Document Released: 10/12/2015 Document Revised: 02/14/2016 Document Reviewed: 10/12/2015 Elsevier Interactive Patient Education  2018 [employer].  How and Where to Give Subcutaneous Injections Using  a Prefilled Syringe A subcutaneous injection is a shot of medicine that is given into the layer of fat that lies between skin and muscle. The injection is given with a single-use syringe that already has medicine inside of it (prefilled syringe). Read the medication guide or package insert that came with the syringe. Follow directions from the guide about how to prepare and give the injection. This is important because the directions may be different for different medicines. Use only the syringe, needle, and medicine that your health care provider prescribes. Use each syringe and needle only one time. Supplies needed:  Syringe.  Needle. Use the needle length and size (gauge) that your health care provider or pharmacist gives to you.  Medicine prescribed by your health care provider.  Alcohol wipes.  Bandage.  A container for syringe disposal. This may be a puncture-proof sharps container or a hard-sided plastic container that has a secure lid, such as an empty laundry  detergent bottle. How to choose a site for injection Follow instructions from your health care provider about where to give an injection. You can give an injection in the abdomen, the thigh, or the back of the arm. Give the injection in a different spot each time. How to give an injection using a prefilled syringe 12. Wash your hands with soap and water. If soap and water are not available, use hand sanitizer. 13. Clean the injection site with an alcohol wipe as told by your health care provider, then let the site air-dry. 14. Remove the plastic cover from the needle on the syringe. Do not let the needle touch anything. 15. Hold the syringe with the needle pointing up. Check the syringe for any remaining air bubbles. If there are air bubbles, flick the syringe with your finger until the air bubbles rise to the top. Then, gently push on the plunger until you can see a drop of medicine appear at the tip of the needle. This will clear any remaining air bubbles from the syringe. 16. Hold the syringe in your writing hand like a pencil. 17. Use your other hand to pinch about an inch of skin, including the area that you cleaned. Do not directly touch the cleaned part of the skin. 18. Insert the entire needle straight into the fold of skin. The needle should be at a 90-degree angle (perpendicular) to the skin, as if to form the letter "L." For example, if you are giving an injection in the arm, the arm should form one leg of the "L" and the needle should form the other leg of the "L." Push the needle all the way against the skin. The needle may need to be injected at a 45-degree angle in thin adults or children who have a small amount of body fat. Think of a 45-degree angle like the letter "V." 19. After the needle is completely inserted into the skin, release the skin that you are pinching. Continue to hold the syringe with your writing hand. 20. Using your non-writing hand, pull back slightly on the plunger to  see if any blood enters the syringe. If there is blood, do not inject the medicine. Remove the needle and start over with a new needle and syringe. 21. If there is no blood, use the thumb of your writing hand to push the plunger until the syringe is empty. If the medicine is a gel, you may need to push harder on the plunger to press the medicine out of the syringe. 22.  Pull the needle straight out of the skin. 23. Press and hold the alcohol wipe over the injection site until bleeding stops. Do not rub the area. 24. Cover the injection site with a bandage. How to safely throw away the supplies If you are using a syringe that does not have a safety system for shielding the needle after injection:  Do not recap the needle. Place the syringe and needle in the disposal container.  If your syringe has a safety system for shielding the needle after injection:  Firmly push down on the plunger after you complete the injection. The protective sleeve will automatically cover the needle, and you will hear a click. The click means that the needle is safely covered.  Follow the disposal regulations for the area where you live. Do not use any syringe or needle more than one time. Contact a health care provider if:  You have difficulty giving the injection.  You think that the injection was not given correctly.  You have difficulty with any of the supplies.  The medicine causes side effects.  Rashes develop on the skin.  A fever develops.  The condition that is being treated gets worse. Get help right away if:  Any of these symptoms develop after the injection is given: ? Difficulty breathing. ? Chest pain. ? A rash over most or all of the body. ? Swelling of the lips or tongue. ? Difficulty swallowing. This information is not intended to replace advice given to you by your health care provider. Make sure you discuss any questions you have with your health care provider. Document Released:  05/22/2011 Document Revised: 05/15/2016 Document Reviewed: 03/23/2015 Elsevier Interactive Patient Education  2018 [place] Head Fracture A radial head fracture is a break in the smaller bone in your forearm (radius). There are two bones in your forearm. The radius, or radial bone, is the bone on the side of your thumb. The fracture is located at the head of the bone, which is at the elbow joint. This usually happens because of an injury, such as falling on an outstretched arm. There are different types of radial head fractures. They are determined by the amount of movement (displacement) of bones from their normal positions:  Type 1. This is a small fracture in which the bone pieces remain together (nondisplaced fracture).  Type 2. The fracture is moderate, and bone pieces are slightly displaced.  Type 3. There may be multiple fractures and displaced bone pieces.  Often, a dislocated elbow happens at the same time as a radial head fracture. What are the causes? This condition is commonly caused by falling and landing on an outstretched arm. What increases the risk? This condition is more likely to develop in:  Women.  People who are 29-37 years old.  What are the signs or symptoms? Symptoms of this condition may include:  Swelling of the elbow joint and pain on the outside of the elbow.  Pain and difficulty when bending or straightening the elbow.  Pain and difficulty when turning the palm of the hand up or down with the elbow bent.  How is this diagnosed? This condition is diagnosed based on a physical exam and your medical history. You may have X-rays to confirm the type of fracture. How is this treated? Treatment for this condition includes resting, icing, and raising (elevating) the injured area above the level of your heart. You may be given medicines to help relieve pain. Treatment varies depending  on the type of fracture you have. If you have a Type 1  fracture, you may be given a splint or sling to keep your arm and elbow from moving (immobilization) for up to 5 days. If you have a Type 2 fracture, treatment varies depending on how much displacement there is. You may be given a splint or sling to keep your arm and elbow from moving (immobilization) for up to 5 days. If the displacement is more severe, you may need surgery. Surgery may include:  Removing bone pieces.  Putting pins or screws into the head of the radius to hold it in place so it heals correctly.  Removing the entire radial head. The elbow will still be able to function normally.  If you have a Type 3 fracture, you will usually need surgery to have bone pieces removed. The entire radial head may need to be removed if the damage is severe. Follow these instructions at home: If you have a splint or sling:  Wear the splint or sling as told by your health care provider. Remove it only as told by your health care provider.  Loosen the splint if your fingers tingle, become numb, or turn cold and blue.  Keep the splint or sling clean and dry. If you have a cast:  Do not stick anything inside the cast to scratch your skin. Doing that increases your risk of infection.  Check the skin around the cast every day. Tell your health care provider about any concerns.  You may put lotion on dry skin around the edges of the cast. Do not apply lotion to the skin underneath the cast. Bathing  Do not take baths, swim, or use a hot tub until your health care provider approves. Ask your health care provider if you can take showers. You may only be allowed to take sponge baths for bathing.  If your health care provider approves bathing and showering, you may need to cover the cast or splint with a plastic bag to protect it from water. Managing pain, stiffness, and swelling  If directed, apply ice to the injured area. ? Put ice in a plastic bag. ? Place a towel between your skin and the  bag. ? Leave the ice on for 20 minutes, 2-3 times a day.  Move your fingers often to avoid stiffness and to lessen swelling.  Elevate the injured area above the level of your heart while you are sitting or lying down. Driving  Do not drive or operate heavy machinery while taking prescription pain medicine.  Ask your health care provider when it is safe to drive if you have a cast, splint, or sling on your arm. Activity  Return to your normal activities as told by your health care provider. Ask your health care provider what activities are safe for you.  Do exercises as told by your health care provider or physical therapist. General instructions  Take over-the-counter and prescription medicines only as told by your health care provider.  Do not put pressure on any part of the cast or splint until it is fully hardened. This may take several hours.  Do not use any tobacco products, such as cigarettes, chewing tobacco, and e-cigarettes. Tobacco can delay bone healing. If you need help quitting, ask your health care provider.  Keep all follow-up visits as told by your health care provider. This is important. Contact a health care provider if:  You have problems with your cast or splint.  You have  pain or swelling that gets worse. Get help right away if:  You have severe pain when you stretch your fingers.  You have fluid or a bad smell coming from your splint.  Your hand or fingers get cold or turn pale or blue.  You lose feeling in any part of your hand or arm. This information is not intended to replace advice given to you by your health care provider. Make sure you discuss any questions you have with your health care provider. Document Released: 06/30/2006 Document Revised: 02/14/2016 Document Reviewed: 03/21/2015 Elsevier Interactive Patient Education  2018 [place]  Radial Head Elbow Fracture Rehab Ask your health care provider which exercises are safe for you. Do  exercises exactly as told by your health care provider and adjust them as directed. It is normal to feel mild stretching, pulling, tightness, or discomfort as you do these exercises, but you should stop right away if you feel sudden pain or your pain gets worse. Do not begin these exercises until told by your health care provider. Range of motion exercises These exercises warm up your muscles and joints and improve the movement and flexibility of your injured elbow. These exercises also help to relieve pain, numbness, and tingling. These exercises are done using the muscles in your injured elbow. Exercise A: Elbow flexion, active 1. Hold your left / right arm at your side, and bend your elbow as far as you can using your left / right arm muscles. 2. Hold this position for __________ seconds. 3. Slowly return to the starting position. Repeat __________ times. Complete this exercise __________ times a day. Exercise B: Elbow extension, active 25. Hold your left / right arm at your side, and straighten your elbow as much as you can using your left / right arm muscles. 26. Hold this position for __________ seconds. 27. Slowly return to the starting position. Repeat __________ times. Complete this exercise __________ times a day. Exercise C: Forearm rotation, supination, active 1. Stand or sit with your elbows at your sides. 2. Bend your left / right elbow to an "L" shape (90 degrees). 3. Turn your palm upward until you feel a gentle stretch on the inside of your forearm. 4. Hold this position for __________ seconds. 5. Slowly release, and return to the starting position. Repeat __________ times. Complete this exercise __________ times a day. Exercise D: Forearm rotation, pronation, active 1. Stand or sit with your elbows at your sides. 2. Bend your left / right elbow to an "L" shape (90 degrees). 3. Turn your left / right palm downward until you feel a gentle stretch on the top of your  forearm. 4. Hold this position for __________ seconds. 5. Slowly release, and return to the starting position. Repeat __________ times. Complete this exercise __________ times a day. Stretching exercises These exercises warm up your muscles and joints and improve the movement and flexibility of your injured elbow. These exercises also help to relieve pain, numbness, and tingling. These exercises are done using your healthy elbow to help stretch the muscles in your injured elbow. Exercise E: Elbow flexion, active-assisted  1. Hold your left / right arm at your side, and bend your elbow as much as you can using your left / right arm muscles. 2. Use your other hand to bend your left / right elbow farther. Do this by gently pushing up on your forearm until you feel a gentle stretch on the back of your elbow. 3. Hold this position for __________  seconds. 4. Slowly return to the starting position. Repeat __________ times. Complete this exercise __________ times a day. Exercise F: Elbow extension, active-assisted  1. Hold your left / right arm at your side, and straighten your elbow as much as you can using your left / right arm muscles. 2. Use your other hand to straighten the left / right elbow farther. Do this by gently pushing down on your forearm until you feel a gentle stretch on the inside of your elbow. 3. Hold this position for __________ seconds. 4. Slowly return to the starting position. Repeat __________ times. Complete this exercise __________ times a day. Exercise G: Forearm rotation, supination, active-assisted  1. Sit with your left / right elbow bent to an "L" shape (90 degrees), with your forearm resting on a table. 2. Keeping your upper body and shoulder still, rotate your forearm so your left / right palm faces upward. 3. Use your other hand to help rotate your forearm further until you feel a gentle to moderate stretch. 4. Hold this position for __________ seconds. 5. Slowly  release the stretch and return to the starting position. Repeat __________ times. Complete this exercise __________ times a day. Exercise H: Forearm rotation, pronation, active-assisted  1. Sit with your left / right elbow bent to an "L" shape (90 degrees), with your forearm resting on a table. 2. Keeping your upper body and shoulder still, rotate your forearm so your palm faces the tabletop. 3. Use your other hand to help rotate your forearm further until you feel a gentle to moderate stretch. 4. Hold this position for __________ seconds. 5. Slowly release the stretch and return to the starting position. Repeat __________ times. Complete this exercise __________ times a day. Exercise I: Elbow flexion, supine, passive  1. Lie on your back. 2. Extend your left / right arm up in the air, bracing it with your other hand. 3. Let your left / right your hand slowly lower toward your shoulder, while your elbow stays pointed toward the ceiling. You should feel a gentle stretch along the back of your upper arm and elbow. 4. If told by your health care provider, you may increase the intensity of your stretch by adding a small wrist weight or hand weight. 5. Hold this position for __________ seconds. 6. Slowly return to the starting position. Repeat __________ times. Complete this exercise __________ times a day. Exercise J: Elbow extension, passive  1. Lie on your back. Make sure that you are in a comfortable position that lets you relax your arm muscles. 2. Place a folded towel under your left / right upper arm so your elbow and shoulder are at the same height. Straighten your left / right arm so your elbow does not rest on the bed or towel. 3. Let the weight of your hand stretch your elbow. Keep your arm and chest muscles relaxed. You should feel a stretch on the inside of your elbow. 4. If told by your health care provider, you may increase the intensity of your stretch by adding a small wrist  weight or hand weight. 5. Hold this position for __________ seconds. 6. Slowly release the stretch. Repeat __________ times. Complete this exercise __________ times a day. Strengthening exercises These exercises build strength and endurance in your elbow. Endurance is the ability to use your muscles for a long time, even after they get tired. Exercise K: Elbow flexion, isometric  1. Stand or sit up straight. 2. Bend your left / right elbow  to an "L" shape (90 degrees), and turn your palm up so your forearm is at the height of your waist. 3. Place your other hand on top of your forearm. Gently push down as your left / right arm resists. Push as hard as you can with both arms without causing any pain or movement at your left / right elbow. 4. Hold this position for __________ seconds. 5. Slowly release the tension in both arms. Let your muscles relax completely before repeating. Repeat __________ times. Complete this exercise __________ times a day. Exercise L: Elbow extensors, isometric  1. Stand or sit up straight. 2. Place your left / right arm so your palm faces your abdomen and is at the height of your waist. 3. Place your other hand on the underside of your forearm. Gently push up as your left / right arm resists. Push as hard as you can with both arms, without causing any pain or movement at your left / right elbow. 4. Hold this position for __________ seconds. 5. Slowly release the tension in both arms. Let your muscles relax completely before repeating. Repeat __________ times. Complete this exercise __________ times a day. Exercise M: Elbow flexion with forearm palm up  1. Sit upright on a firm chair without armrests, or stand. 2. Place your left / right arm at your side with your palm facing forward. 3. Holding a __________weight or gripping a rubber exercise band or tubing, bend your elbow to bring your hand toward your shoulder. 4. Hold this position for __________  seconds. 5. Slowly return to the starting position. Repeat __________times. Complete this exercise __________times a day. Exercise N: Elbow extension  1. Sit on a firm chair without armrests, or stand. 2. Keeping your upper arms at your sides, bring both hands up toward your left / right shoulder while gripping a rubber exercise band or tubing. Your left / right hand should be just below the other hand. 3. Straighten your left / right elbow. 4. Hold this position for __________ seconds. 5. Control the resistance as your hand returns to your side. Repeat __________times. Complete this exercise __________times a day. Exercise O: Forearm rotation, supination  1. Sit with your left / right forearm supported on a table. Keep your elbow at waist height. 2. Rest your hand over the edge of the table with your palm facing down. 3. Gently hold a lightweight hammer. 4. Without moving your elbow, slowly rotate your forearm to turn your palm and hand upward to a "thumbs-up" position. 5. Hold this position for __________ seconds. 6. Slowly return to the starting position. Repeat __________times. Complete this exercise __________times a day. Exercise P: Forearm rotation, pronation  1. Sit with your left / right forearm supported on a table. Keep your elbow below shoulder height. 2. Rest your hand over the edge of the table with your palm facing up. 3. Gently hold a lightweight hammer. 4. Without moving your elbow, slowly rotate your forearm to turn your palm and hand upward to a "thumbs-up" position. 5. Hold this position for __________seconds. 6. Slowly return to the starting position. Repeat __________times. Complete this exercise __________times a day. This information is not intended to replace advice given to you by your health care provider. Make sure you discuss any questions you have with your health care provider. Document Released: 09/08/2005 Document Revised: 05/13/2016 Document Reviewed:  06/03/2015 Elsevier Interactive Patient Education  [employer].

## 2017-12-08 LAB — COMPREHENSIVE METABOLIC PANEL
ALBUMIN: 4.2 g/dL (ref 3.5–5.5)
ALT: 23 IU/L (ref 0–32)
AST: 17 IU/L (ref 0–40)
Albumin/Globulin Ratio: 1.1 — ABNORMAL LOW (ref 1.2–2.2)
Alkaline Phosphatase: 134 IU/L — ABNORMAL HIGH (ref 39–117)
BUN / CREAT RATIO: 20 (ref 9–23)
BUN: 21 mg/dL (ref 6–24)
Bilirubin Total: <0.2 mg/dL (ref 0.0–1.2)
CALCIUM: 9.9 mg/dL (ref 8.7–10.2)
CO2: 20 mmol/L (ref 20–29)
CREATININE: 1.06 mg/dL — AB (ref 0.57–1.00)
Chloride: 96 mmol/L (ref 96–106)
GFR, EST AFRICAN AMERICAN: 67 mL/min/{1.73_m2} (ref >59)
GFR, EST NON AFRICAN AMERICAN: 58 mL/min/{1.73_m2} — AB (ref >59)
GLOBULIN, TOTAL: 3.8 g/dL (ref 1.5–4.5)
GLUCOSE: 280 mg/dL — AB (ref 65–99)
Potassium: 4.7 mmol/L (ref 3.5–5.2)
SODIUM: 134 mmol/L (ref 134–144)
Total Protein: 8 g/dL (ref 6.0–8.5)

## 2017-12-08 LAB — HCV INTERPRETATION

## 2017-12-08 LAB — TSH: TSH: 1.78 u[IU]/mL (ref 0.450–4.500)

## 2017-12-08 LAB — INSULIN, RANDOM: INSULIN: 15 u[IU]/mL (ref 2.6–24.9)

## 2017-12-08 LAB — HCV AB W/RFLX TO VERIFICATION: HCV Ab: 0.1 s/co ratio (ref 0.0–0.9)

## 2017-12-09 ENCOUNTER — Other Ambulatory Visit: Payer: Self-pay | Admitting: Family Medicine

## 2017-12-15 ENCOUNTER — Telehealth: Payer: Self-pay

## 2017-12-15 NOTE — Telephone Encounter (Signed)
Copied from [employer]. Topic: General - Other >> Dec 14, 2017  5:19 PM Valla Leaver wrote: Reason for CRM: Patient canceled 10/06/2017 appt w/ [facility] which was supposed to be her diabetic eye exam that [facility] referred her for. She was having serious eye concerns and was evaluated last in September 2018. The office does not call patient's to r/s if they cancel. They wanted to make PA [facility] aware.

## 2017-12-16 ENCOUNTER — Encounter: Payer: Self-pay | Admitting: Physician Assistant

## 2017-12-16 ENCOUNTER — Other Ambulatory Visit: Payer: Self-pay

## 2017-12-16 ENCOUNTER — Encounter (HOSPITAL_COMMUNITY): Payer: Self-pay | Admitting: Emergency Medicine

## 2017-12-16 ENCOUNTER — Ambulatory Visit (INDEPENDENT_AMBULATORY_CARE_PROVIDER_SITE_OTHER): Payer: BLUE CROSS/BLUE SHIELD

## 2017-12-16 ENCOUNTER — Ambulatory Visit (INDEPENDENT_AMBULATORY_CARE_PROVIDER_SITE_OTHER): Payer: BLUE CROSS/BLUE SHIELD | Admitting: Physician Assistant

## 2017-12-16 ENCOUNTER — Emergency Department (HOSPITAL_COMMUNITY): Payer: BLUE CROSS/BLUE SHIELD

## 2017-12-16 ENCOUNTER — Emergency Department (HOSPITAL_COMMUNITY)
Admission: EM | Admit: 2017-12-16 | Discharge: 2017-12-17 | Disposition: A | Discharge status: Home / Self Care | Payer: BLUE CROSS/BLUE SHIELD | Attending: Emergency Medicine | Admitting: Emergency Medicine

## 2017-12-16 VITALS — BP 114/60 | HR 73 | Temp 98.3°F | Resp 16 | Ht 66.0 in | Wt 157.6 lb

## 2017-12-16 DIAGNOSIS — I252 Old myocardial infarction: Secondary | ICD-10-CM | POA: Diagnosis not present

## 2017-12-16 DIAGNOSIS — R0781 Pleurodynia: Secondary | ICD-10-CM | POA: Diagnosis not present

## 2017-12-16 DIAGNOSIS — R079 Chest pain, unspecified: Secondary | ICD-10-CM | POA: Diagnosis not present

## 2017-12-16 DIAGNOSIS — R0602 Shortness of breath: Secondary | ICD-10-CM | POA: Insufficient documentation

## 2017-12-16 DIAGNOSIS — Z7982 Long term (current) use of aspirin: Secondary | ICD-10-CM | POA: Diagnosis not present

## 2017-12-16 DIAGNOSIS — R0789 Other chest pain: Secondary | ICD-10-CM

## 2017-12-16 DIAGNOSIS — Z87891 Personal history of nicotine dependence: Secondary | ICD-10-CM | POA: Diagnosis not present

## 2017-12-16 DIAGNOSIS — I2510 Atherosclerotic heart disease of native coronary artery without angina pectoris: Secondary | ICD-10-CM | POA: Insufficient documentation

## 2017-12-16 DIAGNOSIS — Z951 Presence of aortocoronary bypass graft: Secondary | ICD-10-CM | POA: Insufficient documentation

## 2017-12-16 DIAGNOSIS — E119 Type 2 diabetes mellitus without complications: Secondary | ICD-10-CM | POA: Insufficient documentation

## 2017-12-16 DIAGNOSIS — R1013 Epigastric pain: Secondary | ICD-10-CM

## 2017-12-16 DIAGNOSIS — Z794 Long term (current) use of insulin: Secondary | ICD-10-CM | POA: Insufficient documentation

## 2017-12-16 DIAGNOSIS — Z79899 Other long term (current) drug therapy: Secondary | ICD-10-CM | POA: Insufficient documentation

## 2017-12-16 DIAGNOSIS — I10 Essential (primary) hypertension: Secondary | ICD-10-CM | POA: Diagnosis not present

## 2017-12-16 DIAGNOSIS — K8020 Calculus of gallbladder without cholecystitis without obstruction: Secondary | ICD-10-CM

## 2017-12-16 DIAGNOSIS — R110 Nausea: Secondary | ICD-10-CM | POA: Diagnosis not present

## 2017-12-16 LAB — CBC
HCT: 36.1 % (ref 36.0–46.0)
Hemoglobin: 11.3 g/dL — ABNORMAL LOW (ref 12.0–15.0)
MCH: 24.6 pg — ABNORMAL LOW (ref 26.0–34.0)
MCHC: 31.3 g/dL (ref 30.0–36.0)
MCV: 78.5 fL (ref 78.0–100.0)
PLATELETS: 248 10*3/uL (ref 150–400)
RBC: 4.6 MIL/uL (ref 3.87–5.11)
RDW: 16.1 % — AB (ref 11.5–15.5)
WBC: 11.1 10*3/uL — AB (ref 4.0–10.5)

## 2017-12-16 LAB — LIPASE, BLOOD: Lipase: 24 U/L (ref 11–51)

## 2017-12-16 LAB — HEPATIC FUNCTION PANEL
ALT: 20 U/L (ref 14–54)
AST: 19 U/L (ref 15–41)
Albumin: 3.6 g/dL (ref 3.5–5.0)
Alkaline Phosphatase: 106 U/L (ref 38–126)
Bilirubin, Direct: <0.1 mg/dL — ABNORMAL LOW (ref 0.1–0.5)
Total Bilirubin: 0.3 mg/dL (ref 0.3–1.2)
Total Protein: 7.9 g/dL (ref 6.5–8.1)

## 2017-12-16 LAB — I-STAT BETA HCG BLOOD, ED (MC, WL, AP ONLY): HCG, QUANTITATIVE: 15.3 m[IU]/mL — AB (ref <5)

## 2017-12-16 LAB — BASIC METABOLIC PANEL
Anion gap: 11 (ref 5–15)
BUN: 23 mg/dL — AB (ref 6–20)
CALCIUM: 9.3 mg/dL (ref 8.9–10.3)
CO2: 21 mmol/L — ABNORMAL LOW (ref 22–32)
CREATININE: 0.94 mg/dL (ref 0.44–1.00)
Chloride: 102 mmol/L (ref 101–111)
GFR calc Af Amer: >60 mL/min (ref >60)
Glucose, Bld: 255 mg/dL — ABNORMAL HIGH (ref 65–99)
Potassium: 4 mmol/L (ref 3.5–5.1)
SODIUM: 134 mmol/L — AB (ref 135–145)

## 2017-12-16 LAB — I-STAT TROPONIN, ED
TROPONIN I, POC: 0 ng/mL (ref 0.00–0.08)
Troponin i, poc: 0 ng/mL (ref 0.00–0.08)

## 2017-12-16 LAB — BRAIN NATRIURETIC PEPTIDE: B Natriuretic Peptide: 87.9 pg/mL (ref 0.0–100.0)

## 2017-12-16 LAB — D-DIMER, QUANTITATIVE (NOT AT ARMC): D-Dimer, Quant: 0.97 ug/mL-FEU — ABNORMAL HIGH (ref 0.00–0.50)

## 2017-12-16 MED ORDER — IOPAMIDOL (ISOVUE-370) INJECTION 76%
INTRAVENOUS | Status: AC
  Administered 2017-12-16: 100 mL
  Filled 2017-12-16: qty 100

## 2017-12-16 NOTE — ED Notes (Signed)
Patient transported to CT 

## 2017-12-16 NOTE — ED Notes (Signed)
CXR completed at PCP office just pta.

## 2017-12-16 NOTE — ED Triage Notes (Addendum)
C/o constant "indigestion" feeling in center of chest x 2 days with intermittent sharp, stabbing pain.  Also reports intermittent nausea, dizziness, and sob.  Pt sent from PCP office.

## 2017-12-16 NOTE — Progress Notes (Signed)
12/16/2017 2:09 PM   DOB: 29-Apr-1961 / MRN: [id number]  SUBJECTIVE:  Victoria Eaton is a 57 y.o. female presenting for chest pain that is made worse with breathing.  She describes the pain as sharp and often severe.  Tells me the pain is also felt in the left shoulder about the trapezius.  Denies leg swelling, diaphoresis, presyncope, change in appetite, orthopnea.  Status post CABG in January 2019.  She is compliant with medical therapy at this time.  She is allergic to [place] [sitagliptin]; kiwi extract; shrimp [shellfish allergy]; and penicillins.   She  has a past medical history of Anxiety, Arthritis, Bladder prolapse, female, acquired (09/14/2017), Depression, High cholesterol, History of stomach ulcers, Hypertension, Myocardial infarction ([place]) (12/30/2004), and Type II diabetes mellitus ([place]).    She  reports that she quit smoking about 2 months ago. Her smoking use included cigarettes. She started smoking about 45 years ago. She has a 5.40 pack-year smoking history. She has never used smokeless tobacco. She reports that she drinks about 8.4 oz of alcohol per week. She reports that she does not use drugs. She  reports that she does not currently engage in sexual activity. The patient  has a past surgical history that includes LEFT HEART CATH AND CORONARY ANGIOGRAPHY (N/A, 10/14/2017); Pilonidal cyst excision (1980s); Dilation and curettage of uterus (1980s); Coronary angioplasty with stent (12/30/2004); Cardiac catheterization (10/14/2017); Coronary artery bypass graft (N/A, 10/19/2017); and TEE without cardioversion (N/A, 10/19/2017).  Her family history includes Heart disease in her father; Hyperlipidemia in her father; Hypertension in her father.  Review of Systems  Constitutional: Negative for chills, diaphoresis and fever.  Eyes: Negative.   Respiratory: Negative for shortness of breath.   Cardiovascular: Negative for chest pain, orthopnea and leg swelling.  Gastrointestinal:  Negative for abdominal pain, blood in stool, constipation, diarrhea, heartburn, melena, nausea and vomiting.  Genitourinary: Negative for flank pain.  Skin: Negative for rash.  Neurological: Negative for dizziness, sensory change, speech change, focal weakness and headaches.    The problem list and medications were reviewed and updated by myself where necessary and exist elsewhere in the encounter.   OBJECTIVE:  BP 114/60   Pulse 73   Temp 98.3 F (36.8 C) (Oral)   Resp 16   Ht 5' 6"  (1.676 m)   Wt 157 lb 9.6 oz (71.5 kg)   SpO2 99%   BMI 25.44 kg/m   Physical Exam  Constitutional: She is active.  Non-toxic appearance.  Cardiovascular: Normal rate, regular rhythm, S1 normal, S2 normal, normal heart sounds and intact distal pulses. Exam reveals no gallop, no friction rub and no decreased pulses.  No murmur heard. Pulmonary/Chest: Effort normal. No stridor. No tachypnea. No respiratory distress. She has no wheezes. She has no rales.  Abdominal: She exhibits no distension.  Musculoskeletal: She exhibits no edema.  Neurological: She is alert.  Skin: Skin is warm and dry. She is not diaphoretic. No pallor.    No results found for this or any previous visit (from the past 72 hour(s)).  Dg Chest 2 View  Result Date: 12/16/2017 CLINICAL DATA:  Pleuritic chest pain EXAM: CHEST - 2 VIEW COMPARISON:  11/25/2017 FINDINGS: CABG changes with mild cardiac enlargement. Negative for heart failure. Lungs are clear without infiltrate or effusion. IMPRESSION: No active cardiopulmonary disease. Electronically Signed   By: Franchot Gallo M.D.   On: 12/16/2017 14:05    ASSESSMENT AND PLAN:  Mehak was seen today for chest xray.  Diagnoses and  all orders for this visit:  Pleuritic chest pain:  I had a long discussion with the patient today.  Given her recent history of CABG .  She is complaining of chest pain along with left upper trapezius pain.  She is not complaining of neck pain.  Her chest  x-ray is negative here.  She needs a cardiac rule out.  Of advised her to go to [facility] emergency room for further evaluation.  She agrees to go.  -     DG Chest 2 View; Future    The patient is advised to call or return to clinic if she does not see an improvement in symptoms, or to seek the care of the closest emergency department if she worsens with the above plan.   Philis Fendt, MHS, PA-C [facility] 12/16/2017 2:09 PM

## 2017-12-16 NOTE — Patient Instructions (Signed)
     IF you received an x-ray today, you will receive an invoice from [facility]. Please contact [facility] at [phone] with questions or concerns regarding your invoice.   IF you received labwork today, you will receive an invoice from [facility]. Please contact [facility] at [phone] with questions or concerns regarding your invoice.   Our billing staff will not be able to assist you with questions regarding bills from these companies.  You will be contacted with the lab results as soon as they are available. The fastest way to get your results is to activate your My Chart account. Instructions are located on the last page of this paperwork. If you have not heard from us regarding the results in 2 weeks, please contact this office.     

## 2017-12-17 ENCOUNTER — Emergency Department (HOSPITAL_COMMUNITY): Payer: BLUE CROSS/BLUE SHIELD

## 2017-12-17 ENCOUNTER — Encounter: Payer: Self-pay | Admitting: *Deleted

## 2017-12-17 DIAGNOSIS — K8020 Calculus of gallbladder without cholecystitis without obstruction: Secondary | ICD-10-CM | POA: Diagnosis not present

## 2017-12-17 MED ORDER — MORPHINE SULFATE (PF) 4 MG/ML IV SOLN
4.0000 mg | Freq: Once | INTRAVENOUS | Status: DC
  Filled 2017-12-17: qty 1

## 2017-12-17 MED ORDER — ASPIRIN EC 81 MG PO TBEC
243.0000 mg | DELAYED_RELEASE_TABLET | Freq: Once | ORAL | Status: DC
  Filled 2017-12-17: qty 3

## 2017-12-17 NOTE — ED Notes (Signed)
Patient transported to Ultrasound 

## 2017-12-17 NOTE — Discharge Instructions (Signed)
Your ultrasound did not some gallstones but no complicating features. Follow-up with your cardiologist in 2 days as scheduled. Can also follow-up with your primary care doctor. Return here for any new/acute changes.

## 2017-12-17 NOTE — ED Notes (Signed)
Returned from U/S

## 2017-12-17 NOTE — ED Provider Notes (Signed)
Assumed care from PA Law at shift change.  See prior notes for full H&P.  Briefly, 57 y.o. F with CABG 1/28 here with chest pain.  Has been intermittent x2 days.  Reports some indigestion and pain in left shoulder as well.  No nausea/vomiting.  Work-up including troponin x2, CTA, etc has been negative.  Case has been discussed with cardiology, recommended increase to full dose ASA, she has clinic follow-up in 2 days.  Plan:  [place] pending.  If negative, can discharge home.  Results for orders placed or performed during the hospital encounter of 16/10/96  Basic metabolic panel  Result Value Ref Range   Sodium 134 (L) 135 - 145 mmol/L   Potassium 4.0 3.5 - 5.1 mmol/L   Chloride 102 101 - 111 mmol/L   CO2 21 (L) 22 - 32 mmol/L   Glucose, Bld 255 (H) 65 - 99 mg/dL   BUN 23 (H) 6 - 20 mg/dL   Creatinine, Ser 0.94 0.44 - 1.00 mg/dL   Calcium 9.3 8.9 - 10.3 mg/dL   GFR calc non Af Amer >60 >60 mL/min   GFR calc Af Amer >60 >60 mL/min   Anion gap 11 5 - 15  CBC  Result Value Ref Range   WBC 11.1 (H) 4.0 - 10.5 K/uL   RBC 4.60 3.87 - 5.11 MIL/uL   Hemoglobin 11.3 (L) 12.0 - 15.0 g/dL   HCT 36.1 36.0 - 46.0 %   MCV 78.5 78.0 - 100.0 fL   MCH 24.6 (L) 26.0 - 34.0 pg   MCHC 31.3 30.0 - 36.0 g/dL   RDW 16.1 (H) 11.5 - 15.5 %   Platelets 248 150 - 400 K/uL  Hepatic function panel  Result Value Ref Range   Total Protein 7.9 6.5 - 8.1 g/dL   Albumin 3.6 3.5 - 5.0 g/dL   AST 19 15 - 41 U/L   ALT 20 14 - 54 U/L   Alkaline Phosphatase 106 38 - 126 U/L   Total Bilirubin 0.3 0.3 - 1.2 mg/dL   Bilirubin, Direct <0.1 (L) 0.1 - 0.5 mg/dL   Indirect Bilirubin NOT CALCULATED 0.3 - 0.9 mg/dL  Lipase, blood  Result Value Ref Range   Lipase 24 11 - 51 U/L  D-dimer, quantitative  Result Value Ref Range   D-Dimer, Quant 0.97 (H) 0.00 - 0.50 ug/mL-FEU  Brain natriuretic peptide  Result Value Ref Range   B Natriuretic Peptide 87.9 0.0 - 100.0 pg/mL  I-stat troponin, ED  Result Value Ref Range   Troponin i, poc 0.00 0.00 - 0.08 ng/mL   Comment 3          I-Stat beta hCG blood, ED  Result Value Ref Range   I-stat hCG, quantitative 15.3 (H) <5 mIU/mL   Comment 3          I-stat troponin, ED  Result Value Ref Range   Troponin i, poc 0.00 0.00 - 0.08 ng/mL   Comment 3           Dg Chest 2 View  Result Date: 12/16/2017 CLINICAL DATA:  Pleuritic chest pain EXAM: CHEST - 2 VIEW COMPARISON:  11/25/2017 FINDINGS: CABG changes with mild cardiac enlargement. Negative for heart failure. Lungs are clear without infiltrate or effusion. IMPRESSION: No active cardiopulmonary disease. Electronically Signed   By: Franchot Gallo M.D.   On: 12/16/2017 14:05   Dg Chest 2 View  Result Date: 11/25/2017 CLINICAL DATA:  Open heart surgery EXAM: CHEST - 2 VIEW COMPARISON:  11/23/2017 FINDINGS: Enlargement of cardiac silhouette post CABG. Atherosclerotic calcification aorta. Mediastinal contours and pulmonary vascularity normal. Lungs clear. No pleural effusion or pneumothorax. Bones demineralized. IMPRESSION: Enlargement of cardiac silhouette post CABG. No acute abnormalities. Electronically Signed   By: Lavonia Dana M.D.   On: 11/25/2017 14:30   Dg Chest 2 View  Result Date: 11/23/2017 CLINICAL DATA:  CABG on 10/19/2017. Small bilateral effusions and bibasilar atelectasis. EXAM: CHEST  2 VIEW COMPARISON:  10/23/2017 FINDINGS: There has been complete resolution of the small bilateral effusions. Atelectasis has also resolved. Mild cardiomegaly. Pulmonary vascularity is normal. CABG. No pneumothorax. No significant bone abnormality. IMPRESSION: No acute abnormalities.  Mild cardiomegaly. Electronically Signed   By: Lorriane Shire M.D.   On: 11/23/2017 13:44   Dg Elbow Complete Left (3+view)  Result Date: 11/25/2017 CLINICAL DATA:  57 year old female with left elbow injury after falling yesterday EXAM: LEFT ELBOW - COMPLETE 3+ VIEW COMPARISON:  None. FINDINGS: Small elbow joint effusion as evidenced by elevation of  the anterior humeral fat pad. No definite radial head fracture is identified. Mild degenerative change at the olecranon and trochlear interface. Normal bony mineralization. No lytic or blastic osseous lesion. IMPRESSION: Small elbow joint effusion raises suspicion for a radiographically occult radial head fracture. No definite fracture is visualized. Electronically Signed   By: Jacqulynn Cadet M.D.   On: 11/25/2017 14:47   Dg Forearm Left  Result Date: 12/07/2017 CLINICAL DATA:  Pain following fall EXAM: LEFT FOREARM - 2 VIEW COMPARISON:  None. FINDINGS: Frontal and lateral views were obtained. No fracture or dislocation. Joint spaces appear normal. No erosive change. IMPRESSION: No fracture or dislocation.  No appreciable arthropathy. Electronically Signed   By: Lowella Grip III M.D.   On: 12/07/2017 11:09   Ct Angio Chest Pe W And/or Wo Contrast  Result Date: 12/16/2017 CLINICAL DATA:  57 year old female with chest pain. Concern for pulmonary embolism. EXAM: CT ANGIOGRAPHY CHEST WITH CONTRAST TECHNIQUE: Multidetector CT imaging of the chest was performed using the standard protocol during bolus administration of intravenous contrast. Multiplanar CT image reconstructions and MIPs were obtained to evaluate the vascular anatomy. CONTRAST:  159m ISOVUE-370 IOPAMIDOL (ISOVUE-370) INJECTION 76% COMPARISON:  Chest radiograph dated 12/16/2017 FINDINGS: Cardiovascular: There is mild cardiomegaly. No pericardial effusion. Coronary vascular calcification and CABG vascular clips. Mild atherosclerotic calcification of the aortic arch. The thoracic aorta is otherwise unremarkable for the degree of enhancement. There is no CT evidence of pulmonary embolism. Mediastinum/Nodes: No hilar or mediastinal adenopathy. The esophagus and the thyroid gland are grossly unremarkable. No mediastinal fluid collection. Lungs/Pleura: Minimal bibasilar atelectatic changes. There is no focal consolidation, pleural effusion, or  pneumothorax. Mild paraseptal emphysema. The central airways are patent. Upper Abdomen: No acute abnormality. Musculoskeletal: Median sternotomy wires. No acute osseous pathology. Review of the MIP images confirms the above findings. IMPRESSION: No acute intrathoracic pathology. No CT evidence of pulmonary embolism. Electronically Signed   By: AAnner CreteM.D.   On: 12/16/2017 23:47   Dg Knee Complete 4 Views Right  Result Date: 11/25/2017 CLINICAL DATA:  Right knee pain after fall yesterday. EXAM: RIGHT KNEE - COMPLETE 4+ VIEW COMPARISON:  None. FINDINGS: No evidence of fracture, dislocation, or joint effusion. Mild narrowing of medial joint space is noted. Soft tissues are unremarkable. IMPRESSION: Mild degenerative joint disease is noted medially. No acute abnormality seen in the right knee. Electronically Signed   By: JMarijo Conception M.D.   On: 11/25/2017 14:32   Dg Humerus Left  Result Date: 12/07/2017 CLINICAL DATA:  Pain following fall 2 weeks prior EXAM: LEFT HUMERUS - 2+ VIEW COMPARISON:  None. FINDINGS: Frontal and lateral views were obtained. No fracture or dislocation. Joint spaces appear normal. No erosive change. IMPRESSION: No fracture or dislocation.  No evident arthropathy. Electronically Signed   By: Lowella Grip III M.D.   On: 12/07/2017 11:08   US Abdomen Limited Ruq  Result Date: 12/17/2017 CLINICAL DATA:  57 year old female with right upper quadrant abdominal pain. EXAM: ULTRASOUND ABDOMEN LIMITED RIGHT UPPER QUADRANT COMPARISON:  CT of the abdomen pelvis dated 10/10/2017 FINDINGS: Gallbladder: Multiple stones within the gallbladder. There is no gallbladder wall thickening or pericholecystic fluid. Evaluation however is limited due to shadowing caused by gallstones. Negative sonographic Murphy's sign. Common bile duct: Diameter: 5 mm Liver: The liver is unremarkable as visualized. Portal vein is patent on color Doppler imaging with normal direction of blood flow towards  the liver. IMPRESSION: Cholelithiasis without sonographic evidence of acute cholecystitis. Electronically Signed   By: Anner Crete M.D.   On: 12/17/2017 01:59    [place] with gallstones but no acute cholecystitis.  Patient can be discharged home to follow-up with cardiology in 2 days as scheduled.  She understands to return here for any new/acute changes.   Larene Pickett, PA-C 12/17/17 [place], April, MD 12/17/17 [phone]

## 2017-12-17 NOTE — Progress Notes (Signed)
D/w PA  And  Reviewed  EKG  And  Symptoms, prior to DC In lieu of  recetn  CABG, pleuritis/pericarditis  Can be  Considered, after  Rule out , pt  Can be  Followed up with office  Cardiology upcoming visit  In one  Week ,  Asa/ibuprogfen ofor  Pericarditis can be  Considered, other  etiolgies of  Pleuritic  Pain  Can be  Considered,  Also if  Cp increases  Informed , ER PAto advise to return to ER EKG simila to old ekg

## 2017-12-17 NOTE — ED Provider Notes (Signed)
[place] EMERGENCY DEPARTMENT Provider Note   CSN: [id number] Arrival date & time: 12/16/17  1513     History   Chief Complaint Chief Complaint  Patient presents with  . Chest Pain    HPI Victoria Eaton is a 57 y.o. female with history of MI and recent CABG on 10/19/2017 who presents with a 2-day history of intermittent chest pain.  Patient reports she has a baseline feeling of indigestion and intermittent sharp pains that radiate to her left shoulder.  She reports it feels similar to when she came in last time before her CABG.  She also had pancreatitis at that time.  She had nausea couple days ago, however none since.  No vomiting.  No fevers.  She denies any significant shortness of breath.  Her pain has been pleuritic as far as the sharp pain.  She was seen by her PCP today who sent her here for cardiac rule out and further evaluation.  Patient has a cardiology appointment scheduled in 2 days.  Patient has not taken any medications at home for symptoms.  She does not want anything for pain at this time, she does not have any pain at the moment.  HPI  Past Medical History:  Diagnosis Date  . Anxiety   . Arthritis    "maybe in my left foot" (10/14/2017)  . Bladder prolapse, female, acquired 09/14/2017  . Depression   . High cholesterol   . History of stomach ulcers   . Hypertension   . Myocardial infarction ([place]) 12/30/2004  . Type II diabetes mellitus [facility])     Patient Active Problem List   Diagnosis Date Noted  . Arm contusion, left, initial encounter 11/25/2017  . Contusion of right knee 11/25/2017  . History of open heart surgery 10/19/2017  . Epigastric pain   . CAD in native artery   . Pancreatitis 10/10/2017  . Hyperlipidemia LDL goal <70 09/14/2017  . Type 2 diabetes mellitus without complication, without long-term current use of insulin ([place]) 05/01/2017  . Essential hypertension 05/01/2017  . Anxiety and depression 05/01/2017  . Tobacco  abuse 05/01/2017  . History of MI (myocardial infarction) 05/01/2017  . Allergic rhinitis 05/01/2017  . History of arthritis 05/01/2017  . Bladder prolapse, female, acquired 11/09/2013    Past Surgical History:  Procedure Laterality Date  . CARDIAC CATHETERIZATION  10/14/2017  . CORONARY ANGIOPLASTY WITH STENT PLACEMENT  12/30/2004   Patient reported  . CORONARY ARTERY BYPASS GRAFT N/A 10/19/2017   Procedure: CORONARY ARTERY BYPASS GRAFTING (CABG) times four using the right saphaneous vein. Harvested endoscopicly and left internal mammary artery.;  Surgeon: Grace Isaac, MD;  Location: [place];  Service: Open Heart Surgery;  Laterality: N/A;  . DILATION AND CURETTAGE OF UTERUS  1980s  . LEFT HEART CATH AND CORONARY ANGIOGRAPHY N/A 10/14/2017   Procedure: LEFT HEART CATH AND CORONARY ANGIOGRAPHY;  Surgeon: Martinique, Peter M, MD;  Location: [place] CV LAB;  Service: Cardiovascular;  Laterality: N/A;  . PILONIDAL CYST EXCISION  1980s  . TEE WITHOUT CARDIOVERSION N/A 10/19/2017   Procedure: TRANSESOPHAGEAL ECHOCARDIOGRAM (TEE);  Surgeon: Grace Isaac, MD;  Location: [place];  Service: Open Heart Surgery;  Laterality: N/A;     OB History   None      Home Medications    Prior to Admission medications   Medication Sig Start Date End Date Taking? Authorizing Provider  amLODipine (NORVASC) 5 MG tablet Take 1 tablet (5 mg total) by mouth  daily. 12/07/17   Shawnee Knapp, MD  aspirin EC 81 MG EC tablet Take 1 tablet (81 mg total) by mouth daily. 10/25/17   Gold, Wayne E, PA-C  atorvastatin (LIPITOR) 40 MG tablet Take 40 mg by mouth daily. 12/05/17   [provider]  atorvastatin (LIPITOR) 80 MG tablet Take 1 tablet (80 mg total) by mouth daily at 6 PM. 12/07/17   Shawnee Knapp, MD  buPROPion (WELLBUTRIN XL) 150 MG 24 hr tablet TAKE ONE TABLET BY MOUTH ONE TIME DAILY 11/09/17   Tenna Delaine D, PA-C  EPINEPHrine (EPIPEN 2-PAK) 0.3 mg/0.3 mL IJ SOAJ injection Inject 0.3 mLs (0.3 mg  total) into the muscle once as needed. Patient taking differently: Inject 0.3 mg into the muscle once as needed (for an allergic reaction).  05/20/17   Tenna Delaine D, PA-C  FARXIGA 10 MG TABS tablet TAKE ONE TABLET BY MOUTH ONE TIME DAILY 11/16/17   Tenna Delaine D, PA-C  HYDROcodone-acetaminophen (NORCO) 5-325 MG tablet Take 1 tablet by mouth every 6 (six) hours as needed for moderate pain. 11/25/17   Horald Pollen, MD  Insulin Glargine (LANTUS SOLOSTAR) 100 UNIT/ML Solostar Pen Inject 10 Units into the skin daily at 10 pm. Increase dose by 2u every 3 days until fasting a.m. cbgs <150 12/07/17   Shawnee Knapp, MD  Insulin Pen Needle (PEN NEEDLES) 32G X 4 MM MISC 1 Units by Does not apply route daily. 12/07/17   Shawnee Knapp, MD  lisinopril (PRINIVIL,ZESTRIL) 20 MG tablet Take 1 tablet (20 mg total) by mouth daily. 12/07/17   Shawnee Knapp, MD  loratadine (CLARITIN) 10 MG tablet Take 1 tablet (10 mg total) by mouth daily. 05/01/17   Tenna Delaine D, PA-C  metoprolol tartrate (LOPRESSOR) 25 MG tablet Take 1 tablet (25 mg total) by mouth 2 (two) times daily. 10/26/17   Tenna Delaine D, PA-C  Multiple Vitamin (MULTIVITAMIN WITH MINERALS) TABS tablet Take 1 tablet by mouth daily.    [provider]  Omega-3 Fatty Acids (FISH OIL CONCENTRATE) 300 MG CAPS Take 1 capsule by mouth daily.    [provider]  oxyCODONE (OXY IR/ROXICODONE) 5 MG immediate release tablet Take 1 tablet (5 mg total) by mouth every 8 (eight) hours as needed for severe pain. 11/23/17   Barrett, Erin R, PA-C  sertraline (ZOLOFT) 50 MG tablet TAKE ONE TABLET BY MOUTH ONE TIME DAILY 12/04/17   Shawnee Knapp, MD    Family History Family History  Problem Relation Age of Onset  . Heart disease Father   . Hyperlipidemia Father   . Hypertension Father     Social History Social History   Tobacco Use  . Smoking status: Former Smoker    Packs/day: 0.12    Years: 45.00    Pack years: 5.40    Types:  Cigarettes    Start date: 05/01/1972    Last attempt to quit: 09/30/2017    Years since quitting: 0.2  . Smokeless tobacco: Never Used  Substance Use Topics  . Alcohol use: Yes    Alcohol/week: 8.4 oz    Types: 14 Glasses of wine per week  . Drug use: No     Allergies   Januvia [sitagliptin]; Kiwi extract; Shrimp [shellfish allergy]; Metformin and related; and Penicillins   Review of Systems Review of Systems  Constitutional: Negative for chills and fever.  HENT: Negative for facial swelling and sore throat.   Respiratory: Negative for shortness of breath.  Cardiovascular: Positive for chest pain.  Gastrointestinal: Positive for nausea. Negative for abdominal pain and vomiting.  Genitourinary: Negative for dysuria.  Musculoskeletal: Negative for back pain.  Skin: Negative for rash and wound.  Neurological: Negative for headaches.  Psychiatric/Behavioral: The patient is not nervous/anxious.      Physical Exam Updated Vital Signs BP 132/60   Pulse 79   Temp 98.4 F (36.9 C) (Oral)   Resp 19   Ht 5' 6"  (1.676 m)   Wt 71.2 kg (157 lb)   SpO2 92%   BMI 25.34 kg/m   Physical Exam  Constitutional: She appears well-developed and well-nourished. No distress.  HENT:  Head: Normocephalic and atraumatic.  Mouth/Throat: Oropharynx is clear and moist. No oropharyngeal exudate.  Eyes: Pupils are equal, round, and reactive to light. Conjunctivae are normal. Right eye exhibits no discharge. Left eye exhibits no discharge. No scleral icterus.  Neck: Normal range of motion. Neck supple. No thyromegaly present.  Cardiovascular: Normal rate, regular rhythm, normal heart sounds and intact distal pulses. Exam reveals no gallop and no friction rub.  No murmur heard. Pulmonary/Chest: Effort normal and breath sounds normal. No stridor. No respiratory distress. She has no wheezes. She has no rales.  Abdominal: Soft. Bowel sounds are normal. She exhibits no distension. There is tenderness in  the epigastric area. There is no rebound and no guarding.  Musculoskeletal: She exhibits no edema.  Lymphadenopathy:    She has no cervical adenopathy.  Neurological: She is alert. Coordination normal.  Skin: Skin is warm and dry. No rash noted. She is not diaphoretic. No pallor.  Psychiatric: She has a normal mood and affect.  Nursing note and vitals reviewed.    ED Treatments / Results  Labs (all labs ordered are listed, but only abnormal results are displayed) Labs Reviewed  BASIC METABOLIC PANEL - Abnormal; Notable for the following components:      Result Value   Sodium 134 (*)    CO2 21 (*)    Glucose, Bld 255 (*)    BUN 23 (*)    All other components within normal limits  CBC - Abnormal; Notable for the following components:   WBC 11.1 (*)    Hemoglobin 11.3 (*)    MCH 24.6 (*)    RDW 16.1 (*)    All other components within normal limits  HEPATIC FUNCTION PANEL - Abnormal; Notable for the following components:   Bilirubin, Direct <0.1 (*)    All other components within normal limits  D-DIMER, QUANTITATIVE (NOT AT [facility]) - Abnormal; Notable for the following components:   D-Dimer, Quant 0.97 (*)    All other components within normal limits  I-STAT BETA HCG BLOOD, ED ([facility], [facility], [facility] ONLY) - Abnormal; Notable for the following components:   I-stat hCG, quantitative 15.3 (*)    All other components within normal limits  LIPASE, BLOOD  BRAIN NATRIURETIC PEPTIDE  BRAIN NATRIURETIC PEPTIDE  I-STAT TROPONIN, ED  I-STAT TROPONIN, ED    EKG EKG Interpretation  Date/Time:  Wednesday December 16 2017 15:19:56 EDT Ventricular Rate:  81 PR Interval:  172 QRS Duration: 92 QT Interval:  348 QTC Calculation: 404 R Axis:   -6 Text Interpretation:  Normal sinus rhythm Possible Left atrial enlargement Inferior infarct , age undetermined Anterior infarct , age undetermined T wave abnormality, consider lateral ischemia Confirmed by Randal Buba, April ([id number]) on 12/17/2017 12:16:48  AM   Radiology Dg Chest 2 View  Result Date: 12/16/2017 CLINICAL DATA:  Pleuritic chest pain  EXAM: CHEST - 2 VIEW COMPARISON:  11/25/2017 FINDINGS: CABG changes with mild cardiac enlargement. Negative for heart failure. Lungs are clear without infiltrate or effusion. IMPRESSION: No active cardiopulmonary disease. Electronically Signed   By: Franchot Gallo M.D.   On: 12/16/2017 14:05   Ct Angio Chest Pe W And/or Wo Contrast  Result Date: 12/16/2017 CLINICAL DATA:  57 year old female with chest pain. Concern for pulmonary embolism. EXAM: CT ANGIOGRAPHY CHEST WITH CONTRAST TECHNIQUE: Multidetector CT imaging of the chest was performed using the standard protocol during bolus administration of intravenous contrast. Multiplanar CT image reconstructions and MIPs were obtained to evaluate the vascular anatomy. CONTRAST:  14m ISOVUE-370 IOPAMIDOL (ISOVUE-370) INJECTION 76% COMPARISON:  Chest radiograph dated 12/16/2017 FINDINGS: Cardiovascular: There is mild cardiomegaly. No pericardial effusion. Coronary vascular calcification and CABG vascular clips. Mild atherosclerotic calcification of the aortic arch. The thoracic aorta is otherwise unremarkable for the degree of enhancement. There is no CT evidence of pulmonary embolism. Mediastinum/Nodes: No hilar or mediastinal adenopathy. The esophagus and the thyroid gland are grossly unremarkable. No mediastinal fluid collection. Lungs/Pleura: Minimal bibasilar atelectatic changes. There is no focal consolidation, pleural effusion, or pneumothorax. Mild paraseptal emphysema. The central airways are patent. Upper Abdomen: No acute abnormality. Musculoskeletal: Median sternotomy wires. No acute osseous pathology. Review of the MIP images confirms the above findings. IMPRESSION: No acute intrathoracic pathology. No CT evidence of pulmonary embolism. Electronically Signed   By: AAnner CreteM.D.   On: 12/16/2017 23:47    Procedures Procedures (including critical  care time)  Medications Ordered in ED Medications  iopamidol (ISOVUE-370) 76 % injection (100 mLs  Contrast Given 12/16/17 2323)     Initial Impression / Assessment and Plan / ED Course  I have reviewed the triage vital signs and the nursing notes.  Pertinent labs & imaging results that were available during my care of the patient were reviewed by me and considered in my medical decision making (see chart for details).     Patient with a 2-day history of chest pain.  She reports her pain as indigestion with intermittent sharp pains that is worse with breathing.  PE ruled out with CT, which was negative, as d-dimer was elevated to 0.97.  Delta troponin is negative.  BMP shows sodium 134, glucose 255, BUN 23.  Lipase 24.  Hepatic function panel within normal limits.  Patient does have epigastric tenderness. RUQ UKoreapending.  I consulted cardiology and spoke with Dr. VTeena Dunkwho advised treatment with full dose aspirin for possible pleuritis versus pericarditis.  Patient to follow-up with cardiology at her scheduled appointment on Friday.  He advised that she is safe for discharge and does not need further cycling of troponins.  Considering history of gallstones and patient has had indigestion feeling, right upper quadrant pending prior to discharge. At shift change, patient care transferred to LQuincy Carnes PA-C for continued evaluation, follow up of RUQ UKoreaand determination of disposition. Anticipate discharge if RUQ negative with Pepcid, pain control, and follow up to cardiology on 12/18/2017. I discussed patient case with Dr. IEllender Hosewho guided the patient's management and agrees with plan.     Final Clinical Impressions(s) / ED Diagnoses   Final diagnoses:  Epigastric pain  Atypical chest pain    ED Discharge Orders    None       LFrederica Kuster PA-C 12/17/17 0109    IDuffy Bruce MD 12/17/17 [phone]

## 2017-12-31 ENCOUNTER — Other Ambulatory Visit: Payer: Self-pay

## 2017-12-31 ENCOUNTER — Observation Stay (HOSPITAL_COMMUNITY)
Admission: EM | Admit: 2017-12-31 | Discharge: 2018-01-02 | Disposition: A | Discharge status: Home / Self Care | Payer: BLUE CROSS/BLUE SHIELD | Attending: Internal Medicine | Admitting: Internal Medicine

## 2017-12-31 ENCOUNTER — Encounter (HOSPITAL_COMMUNITY): Payer: Self-pay | Admitting: Emergency Medicine

## 2017-12-31 ENCOUNTER — Other Ambulatory Visit: Payer: Self-pay | Admitting: Family Medicine

## 2017-12-31 DIAGNOSIS — Z880 Allergy status to penicillin: Secondary | ICD-10-CM | POA: Diagnosis not present

## 2017-12-31 DIAGNOSIS — I252 Old myocardial infarction: Secondary | ICD-10-CM

## 2017-12-31 DIAGNOSIS — F419 Anxiety disorder, unspecified: Secondary | ICD-10-CM | POA: Insufficient documentation

## 2017-12-31 DIAGNOSIS — Z87891 Personal history of nicotine dependence: Secondary | ICD-10-CM | POA: Diagnosis not present

## 2017-12-31 DIAGNOSIS — E785 Hyperlipidemia, unspecified: Secondary | ICD-10-CM | POA: Diagnosis present

## 2017-12-31 DIAGNOSIS — E119 Type 2 diabetes mellitus without complications: Secondary | ICD-10-CM | POA: Diagnosis not present

## 2017-12-31 DIAGNOSIS — Z8249 Family history of ischemic heart disease and other diseases of the circulatory system: Secondary | ICD-10-CM | POA: Insufficient documentation

## 2017-12-31 DIAGNOSIS — R7309 Other abnormal glucose: Secondary | ICD-10-CM | POA: Diagnosis not present

## 2017-12-31 DIAGNOSIS — I25710 Atherosclerosis of autologous vein coronary artery bypass graft(s) with unstable angina pectoris: Secondary | ICD-10-CM | POA: Diagnosis not present

## 2017-12-31 DIAGNOSIS — M1990 Unspecified osteoarthritis, unspecified site: Secondary | ICD-10-CM | POA: Diagnosis not present

## 2017-12-31 DIAGNOSIS — I2582 Chronic total occlusion of coronary artery: Secondary | ICD-10-CM | POA: Insufficient documentation

## 2017-12-31 DIAGNOSIS — Z794 Long term (current) use of insulin: Secondary | ICD-10-CM | POA: Diagnosis not present

## 2017-12-31 DIAGNOSIS — E108 Type 1 diabetes mellitus with unspecified complications: Secondary | ICD-10-CM | POA: Diagnosis not present

## 2017-12-31 DIAGNOSIS — Z955 Presence of coronary angioplasty implant and graft: Secondary | ICD-10-CM | POA: Diagnosis not present

## 2017-12-31 DIAGNOSIS — R079 Chest pain, unspecified: Secondary | ICD-10-CM | POA: Diagnosis present

## 2017-12-31 DIAGNOSIS — Z1231 Encounter for screening mammogram for malignant neoplasm of breast: Secondary | ICD-10-CM

## 2017-12-31 DIAGNOSIS — Z7982 Long term (current) use of aspirin: Secondary | ICD-10-CM | POA: Insufficient documentation

## 2017-12-31 DIAGNOSIS — I10 Essential (primary) hypertension: Secondary | ICD-10-CM | POA: Diagnosis not present

## 2017-12-31 DIAGNOSIS — R0789 Other chest pain: Secondary | ICD-10-CM | POA: Diagnosis not present

## 2017-12-31 DIAGNOSIS — F329 Major depressive disorder, single episode, unspecified: Secondary | ICD-10-CM | POA: Insufficient documentation

## 2017-12-31 DIAGNOSIS — E7800 Pure hypercholesterolemia, unspecified: Secondary | ICD-10-CM | POA: Diagnosis not present

## 2017-12-31 DIAGNOSIS — I2584 Coronary atherosclerosis due to calcified coronary lesion: Secondary | ICD-10-CM | POA: Diagnosis not present

## 2017-12-31 DIAGNOSIS — I200 Unstable angina: Secondary | ICD-10-CM | POA: Diagnosis present

## 2017-12-31 LAB — I-STAT TROPONIN, ED: Troponin i, poc: 0.03 ng/mL (ref 0.00–0.08)

## 2017-12-31 MED ORDER — MORPHINE SULFATE (PF) 4 MG/ML IV SOLN
4.0000 mg | Freq: Once | INTRAVENOUS | Status: AC
  Administered 2018-01-01: 4 mg via INTRAVENOUS
  Filled 2017-12-31: qty 1

## 2017-12-31 NOTE — ED Provider Notes (Signed)
[place] EMERGENCY DEPARTMENT Provider Note   CSN: [id number] Arrival date & time:        History   Chief Complaint Chief Complaint  Patient presents with  . Chest Pain    HPI Victoria Eaton is a 57 y.o. female.  Patient with PMH remarkable for CABG in 09/2017, presents to the ED with a chief complaint of chest pain.  She states that the symptoms started at 8pm.  She called EMS and was advised to chew 4 baby aspirin.  She states that her BP was significantly elevated at the time in the 180s.  She states that the pain subsided on the way to the hospital, but she still reports some discomfort.  She states that the pain radiated to her left shoulder and jaw.  She reports associated SOB, but denies diaphoresis or nausea.  The history is provided by the patient. No language interpreter was used.    Past Medical History:  Diagnosis Date  . Anxiety   . Arthritis    "maybe in my left foot" (10/14/2017)  . Bladder prolapse, female, acquired 09/14/2017  . Depression   . High cholesterol   . History of stomach ulcers   . Hypertension   . Myocardial infarction ([place]) 12/30/2004  . Type II diabetes mellitus [facility])     Patient Active Problem List   Diagnosis Date Noted  . Arm contusion, left, initial encounter 11/25/2017  . Contusion of right knee 11/25/2017  . History of open heart surgery 10/19/2017  . Epigastric pain   . CAD in native artery   . Pancreatitis 10/10/2017  . Hyperlipidemia LDL goal <70 09/14/2017  . Type 2 diabetes mellitus without complication, without long-term current use of insulin ([place]) 05/01/2017  . Essential hypertension 05/01/2017  . Anxiety and depression 05/01/2017  . Tobacco abuse 05/01/2017  . History of MI (myocardial infarction) 05/01/2017  . Allergic rhinitis 05/01/2017  . History of arthritis 05/01/2017  . Bladder prolapse, female, acquired 11/09/2013    Past Surgical History:  Procedure Laterality Date  . CARDIAC  CATHETERIZATION  10/14/2017  . CORONARY ANGIOPLASTY WITH STENT PLACEMENT  12/30/2004   Patient reported  . CORONARY ARTERY BYPASS GRAFT N/A 10/19/2017   Procedure: CORONARY ARTERY BYPASS GRAFTING (CABG) times four using the right saphaneous vein. Harvested endoscopicly and left internal mammary artery.;  Surgeon: Grace Isaac, MD;  Location: [place];  Service: Open Heart Surgery;  Laterality: N/A;  . DILATION AND CURETTAGE OF UTERUS  1980s  . LEFT HEART CATH AND CORONARY ANGIOGRAPHY N/A 10/14/2017   Procedure: LEFT HEART CATH AND CORONARY ANGIOGRAPHY;  Surgeon: Martinique, Peter M, MD;  Location: [place] CV LAB;  Service: Cardiovascular;  Laterality: N/A;  . PILONIDAL CYST EXCISION  1980s  . TEE WITHOUT CARDIOVERSION N/A 10/19/2017   Procedure: TRANSESOPHAGEAL ECHOCARDIOGRAM (TEE);  Surgeon: Grace Isaac, MD;  Location: [place];  Service: Open Heart Surgery;  Laterality: N/A;     OB History   None      Home Medications    Prior to Admission medications   Medication Sig Start Date End Date Taking? Authorizing Provider  amLODipine (NORVASC) 5 MG tablet Take 1 tablet (5 mg total) by mouth daily. 12/07/17   Shawnee Knapp, MD  aspirin EC 81 MG EC tablet Take 1 tablet (81 mg total) by mouth daily. 10/25/17   Gold, Wayne E, PA-C  atorvastatin (LIPITOR) 40 MG tablet Take 40 mg by mouth daily. 12/05/17   [provider]  atorvastatin (LIPITOR) 80 MG tablet Take 1 tablet (80 mg total) by mouth daily at 6 PM. 12/07/17   Shawnee Knapp, MD  buPROPion (WELLBUTRIN XL) 150 MG 24 hr tablet TAKE ONE TABLET BY MOUTH ONE TIME DAILY 11/09/17   Tenna Delaine D, PA-C  EPINEPHrine (EPIPEN 2-PAK) 0.3 mg/0.3 mL IJ SOAJ injection Inject 0.3 mLs (0.3 mg total) into the muscle once as needed. Patient taking differently: Inject 0.3 mg into the muscle once as needed (for an allergic reaction).  05/20/17   Tenna Delaine D, PA-C  FARXIGA 10 MG TABS tablet TAKE ONE TABLET BY MOUTH ONE TIME DAILY 11/16/17    Tenna Delaine D, PA-C  HYDROcodone-acetaminophen (NORCO) 5-325 MG tablet Take 1 tablet by mouth every 6 (six) hours as needed for moderate pain. 11/25/17   Horald Pollen, MD  Insulin Glargine (LANTUS SOLOSTAR) 100 UNIT/ML Solostar Pen Inject 10 Units into the skin daily at 10 pm. Increase dose by 2u every 3 days until fasting a.m. cbgs <150 12/07/17   Shawnee Knapp, MD  Insulin Pen Needle (PEN NEEDLES) 32G X 4 MM MISC 1 Units by Does not apply route daily. 12/07/17   Shawnee Knapp, MD  lisinopril (PRINIVIL,ZESTRIL) 20 MG tablet Take 1 tablet (20 mg total) by mouth daily. 12/07/17   Shawnee Knapp, MD  loratadine (CLARITIN) 10 MG tablet Take 1 tablet (10 mg total) by mouth daily. 05/01/17   Tenna Delaine D, PA-C  metoprolol tartrate (LOPRESSOR) 25 MG tablet Take 1 tablet (25 mg total) by mouth 2 (two) times daily. 10/26/17   Tenna Delaine D, PA-C  Multiple Vitamin (MULTIVITAMIN WITH MINERALS) TABS tablet Take 1 tablet by mouth daily.    [provider]  Omega-3 Fatty Acids (FISH OIL CONCENTRATE) 300 MG CAPS Take 1 capsule by mouth daily.    [provider]  oxyCODONE (OXY IR/ROXICODONE) 5 MG immediate release tablet Take 1 tablet (5 mg total) by mouth every 8 (eight) hours as needed for severe pain. 11/23/17   Barrett, Erin R, PA-C  sertraline (ZOLOFT) 50 MG tablet TAKE ONE TABLET BY MOUTH ONE TIME DAILY 12/04/17   Shawnee Knapp, MD    Family History Family History  Problem Relation Age of Onset  . Heart disease Father   . Hyperlipidemia Father   . Hypertension Father     Social History Social History   Tobacco Use  . Smoking status: Former Smoker    Packs/day: 0.12    Years: 45.00    Pack years: 5.40    Types: Cigarettes    Start date: 05/01/1972    Last attempt to quit: 09/30/2017    Years since quitting: 0.2  . Smokeless tobacco: Never Used  Substance Use Topics  . Alcohol use: Yes    Alcohol/week: 8.4 oz    Types: 14 Glasses of wine per week  . Drug use: No      Allergies   Januvia [sitagliptin]; Kiwi extract; Shrimp [shellfish allergy]; Metformin and related; and Penicillins   Review of Systems Review of Systems  All other systems reviewed and are negative.    Physical Exam Updated Vital Signs BP 139/68 (BP Location: Right Arm)   Pulse 68   Temp 98.6 F (37 C) (Oral)   Resp 17   Ht 5' 6"  (1.676 m)   Wt 71.2 kg (157 lb)   SpO2 96%   BMI 25.34 kg/m   Physical Exam  Constitutional: She is oriented to person, place,  and time. She appears well-developed and well-nourished.  HENT:  Head: Normocephalic and atraumatic.  Eyes: Pupils are equal, round, and reactive to light. Conjunctivae and EOM are normal.  Neck: Normal range of motion. Neck supple.  Cardiovascular: Normal rate and regular rhythm. Exam reveals no gallop and no friction rub.  No murmur heard. Pulmonary/Chest: Effort normal and breath sounds normal. No respiratory distress. She has no wheezes. She has no rales. She exhibits no tenderness.  Abdominal: Soft. Bowel sounds are normal. She exhibits no distension and no mass. There is no tenderness. There is no rebound and no guarding.  Musculoskeletal: Normal range of motion. She exhibits no edema or tenderness.  Neurological: She is alert and oriented to person, place, and time.  Skin: Skin is warm and dry.  Psychiatric: She has a normal mood and affect. Her behavior is normal. Judgment and thought content normal.  Nursing note and vitals reviewed.    ED Treatments / Results  Labs (all labs ordered are listed, but only abnormal results are displayed) Labs Reviewed  BASIC METABOLIC PANEL - Abnormal; Notable for the following components:      Result Value   Glucose, Bld 263 (*)    All other components within normal limits  CBC - Abnormal; Notable for the following components:   Hemoglobin 10.6 (*)    HCT 33.4 (*)    MCH 24.8 (*)    RDW 17.1 (*)    All other components within normal limits  I-STAT TROPONIN, ED   I-STAT TROPONIN, ED    EKG EKG Interpretation  Date/Time:  Thursday December 31 2017 23:21:01 EDT Ventricular Rate:  65 PR Interval:  192 QRS Duration: 100 QT Interval:  400 QTC Calculation: 416 R Axis:   9 Text Interpretation:  Sinus rhythm with Fusion complexes Left ventricular hypertrophy with repolarization abnormality Cannot rule out Inferior infarct , age undetermined Anteroseptal infarct , age undetermined Abnormal ECG Confirmed by Veryl Speak [phone]) on 12/31/2017 11:28:24 PM   Radiology No results found.  Procedures Procedures (including critical care time)  Medications Ordered in ED Medications  morphine 4 MG/ML injection 4 mg (has no administration in time range)     Initial Impression / Assessment and Plan / ED Course  I have reviewed the triage vital signs and the nursing notes.  Pertinent labs & imaging results that were available during my care of the patient were reviewed by me and considered in my medical decision making (see chart for details).     Patient with CP intermittently today.  Came on much worse at about 8pm tonight.  Radiated to left arm and jaw with SOB.  Took aspirin and some improvement, but still reports mild discomfort/pressure.  Repeat troponin is negative.  Given the patient's recurrent symptoms and return to the emergency department, will consult cardiology.  Spoke with Dr. Kenton Kingfisher from cardiology, who has evaluated the patient, and will admit the patient for further workup.  Final Clinical Impressions(s) / ED Diagnoses   Final diagnoses:  Chest pain, unspecified type    ED Discharge Orders    None       Montine Circle, PA-C 01/01/18 4268    Veryl Speak, MD 01/01/18 [phone]

## 2017-12-31 NOTE — ED Triage Notes (Signed)
Pt BIB EMS from home with c/o chest pain. Pain radiates to L arm, but pt denies any additional s/sx. Pain began approx 2000 tonight, described as tightness. Pt took 382m ASA at home. VSS and WNL. Pain resolved en route.

## 2018-01-01 ENCOUNTER — Emergency Department (HOSPITAL_COMMUNITY): Payer: BLUE CROSS/BLUE SHIELD

## 2018-01-01 ENCOUNTER — Observation Stay (HOSPITAL_COMMUNITY): Payer: BLUE CROSS/BLUE SHIELD

## 2018-01-01 ENCOUNTER — Encounter (HOSPITAL_COMMUNITY)
Admission: EM | Disposition: A | Discharge status: Home / Self Care | Payer: Self-pay | Source: Home / Self Care | Attending: Emergency Medicine

## 2018-01-01 ENCOUNTER — Encounter (HOSPITAL_COMMUNITY): Payer: Self-pay | Admitting: Interventional Cardiology

## 2018-01-01 DIAGNOSIS — R079 Chest pain, unspecified: Secondary | ICD-10-CM | POA: Diagnosis not present

## 2018-01-01 DIAGNOSIS — Z87891 Personal history of nicotine dependence: Secondary | ICD-10-CM | POA: Diagnosis not present

## 2018-01-01 DIAGNOSIS — I252 Old myocardial infarction: Secondary | ICD-10-CM | POA: Diagnosis not present

## 2018-01-01 DIAGNOSIS — Z794 Long term (current) use of insulin: Secondary | ICD-10-CM | POA: Diagnosis not present

## 2018-01-01 DIAGNOSIS — M1990 Unspecified osteoarthritis, unspecified site: Secondary | ICD-10-CM | POA: Diagnosis not present

## 2018-01-01 DIAGNOSIS — E7800 Pure hypercholesterolemia, unspecified: Secondary | ICD-10-CM | POA: Diagnosis not present

## 2018-01-01 DIAGNOSIS — Z7982 Long term (current) use of aspirin: Secondary | ICD-10-CM | POA: Diagnosis not present

## 2018-01-01 DIAGNOSIS — I25110 Atherosclerotic heart disease of native coronary artery with unstable angina pectoris: Secondary | ICD-10-CM

## 2018-01-01 DIAGNOSIS — Z880 Allergy status to penicillin: Secondary | ICD-10-CM | POA: Diagnosis not present

## 2018-01-01 DIAGNOSIS — E119 Type 2 diabetes mellitus without complications: Secondary | ICD-10-CM | POA: Diagnosis not present

## 2018-01-01 DIAGNOSIS — F329 Major depressive disorder, single episode, unspecified: Secondary | ICD-10-CM | POA: Diagnosis not present

## 2018-01-01 DIAGNOSIS — Z955 Presence of coronary angioplasty implant and graft: Secondary | ICD-10-CM | POA: Diagnosis not present

## 2018-01-01 DIAGNOSIS — I200 Unstable angina: Secondary | ICD-10-CM | POA: Diagnosis present

## 2018-01-01 DIAGNOSIS — I2584 Coronary atherosclerosis due to calcified coronary lesion: Secondary | ICD-10-CM | POA: Diagnosis not present

## 2018-01-01 DIAGNOSIS — I25710 Atherosclerosis of autologous vein coronary artery bypass graft(s) with unstable angina pectoris: Secondary | ICD-10-CM | POA: Diagnosis not present

## 2018-01-01 DIAGNOSIS — I2582 Chronic total occlusion of coronary artery: Secondary | ICD-10-CM | POA: Diagnosis not present

## 2018-01-01 DIAGNOSIS — Z8249 Family history of ischemic heart disease and other diseases of the circulatory system: Secondary | ICD-10-CM | POA: Diagnosis not present

## 2018-01-01 DIAGNOSIS — I10 Essential (primary) hypertension: Secondary | ICD-10-CM | POA: Diagnosis not present

## 2018-01-01 DIAGNOSIS — F419 Anxiety disorder, unspecified: Secondary | ICD-10-CM | POA: Diagnosis not present

## 2018-01-01 HISTORY — PX: LEFT HEART CATH AND CORS/GRAFTS ANGIOGRAPHY: CATH118250

## 2018-01-01 LAB — BASIC METABOLIC PANEL
Anion gap: 9 (ref 5–15)
BUN: 16 mg/dL (ref 6–20)
CHLORIDE: 104 mmol/L (ref 101–111)
CO2: 23 mmol/L (ref 22–32)
CREATININE: 0.91 mg/dL (ref 0.44–1.00)
Calcium: 9 mg/dL (ref 8.9–10.3)
GFR calc Af Amer: >60 mL/min (ref >60)
GFR calc non Af Amer: >60 mL/min (ref >60)
GLUCOSE: 263 mg/dL — AB (ref 65–99)
POTASSIUM: 3.7 mmol/L (ref 3.5–5.1)
SODIUM: 136 mmol/L (ref 135–145)

## 2018-01-01 LAB — I-STAT TROPONIN, ED: Troponin i, poc: 0.04 ng/mL (ref 0.00–0.08)

## 2018-01-01 LAB — CBC
HCT: 33.4 % — ABNORMAL LOW (ref 36.0–46.0)
Hemoglobin: 10.6 g/dL — ABNORMAL LOW (ref 12.0–15.0)
MCH: 24.8 pg — AB (ref 26.0–34.0)
MCHC: 31.7 g/dL (ref 30.0–36.0)
MCV: 78 fL (ref 78.0–100.0)
Platelets: 190 10*3/uL (ref 150–400)
RBC: 4.28 MIL/uL (ref 3.87–5.11)
RDW: 17.1 % — AB (ref 11.5–15.5)
WBC: 9.6 10*3/uL (ref 4.0–10.5)

## 2018-01-01 LAB — GLUCOSE, CAPILLARY
GLUCOSE-CAPILLARY: 207 mg/dL — AB (ref 65–99)
GLUCOSE-CAPILLARY: 229 mg/dL — AB (ref 65–99)
Glucose-Capillary: 130 mg/dL — ABNORMAL HIGH (ref 65–99)

## 2018-01-01 SURGERY — LEFT HEART CATH AND CORS/GRAFTS ANGIOGRAPHY
Anesthesia: LOCAL

## 2018-01-01 MED ORDER — FENTANYL CITRATE (PF) 100 MCG/2ML IJ SOLN
INTRAMUSCULAR | Status: DC | PRN
  Administered 2018-01-01 (×2): 25 ug via INTRAVENOUS

## 2018-01-01 MED ORDER — SERTRALINE HCL 50 MG PO TABS
50.0000 mg | ORAL_TABLET | Freq: Every day | ORAL | Status: DC
  Administered 2018-01-01 – 2018-01-02 (×2): 50 mg via ORAL
  Filled 2018-01-01 (×3): qty 1

## 2018-01-01 MED ORDER — HEPARIN (PORCINE) IN NACL 2-0.9 UNIT/ML-% IJ SOLN
INTRAMUSCULAR | Status: AC | PRN
  Administered 2018-01-01 (×2): 500 mL

## 2018-01-01 MED ORDER — CANAGLIFLOZIN 100 MG PO TABS
100.0000 mg | ORAL_TABLET | Freq: Every day | ORAL | Status: DC
  Administered 2018-01-02 (×2): 100 mg via ORAL
  Filled 2018-01-01 (×3): qty 1

## 2018-01-01 MED ORDER — LIDOCAINE HCL (PF) 1 % IJ SOLN
INTRAMUSCULAR | Status: DC | PRN
  Administered 2018-01-01: 20 mL

## 2018-01-01 MED ORDER — BUPROPION HCL ER (XL) 150 MG PO TB24
150.0000 mg | ORAL_TABLET | Freq: Every day | ORAL | Status: DC
  Administered 2018-01-01 – 2018-01-02 (×2): 150 mg via ORAL
  Filled 2018-01-01 (×2): qty 1

## 2018-01-01 MED ORDER — METOPROLOL TARTRATE 25 MG PO TABS
25.0000 mg | ORAL_TABLET | Freq: Two times a day (BID) | ORAL | Status: DC
  Administered 2018-01-01 – 2018-01-02 (×3): 25 mg via ORAL
  Filled 2018-01-01 (×3): qty 1

## 2018-01-01 MED ORDER — ADULT MULTIVITAMIN W/MINERALS CH
1.0000 | ORAL_TABLET | Freq: Every day | ORAL | Status: DC
  Administered 2018-01-01 – 2018-01-02 (×2): 1 via ORAL
  Filled 2018-01-01 (×2): qty 1

## 2018-01-01 MED ORDER — LISINOPRIL 10 MG PO TABS
20.0000 mg | ORAL_TABLET | Freq: Every day | ORAL | Status: DC
  Administered 2018-01-01 – 2018-01-02 (×2): 20 mg via ORAL
  Filled 2018-01-01: qty 2
  Filled 2018-01-01: qty 1

## 2018-01-01 MED ORDER — SODIUM CHLORIDE 0.9% FLUSH
3.0000 mL | INTRAVENOUS | Status: DC | PRN
  Administered 2018-01-01: 18:00:00 3 mL via INTRAVENOUS
  Filled 2018-01-01: qty 3

## 2018-01-01 MED ORDER — FENTANYL CITRATE (PF) 100 MCG/2ML IJ SOLN
INTRAMUSCULAR | Status: AC
  Filled 2018-01-01: qty 2

## 2018-01-01 MED ORDER — MIDAZOLAM HCL 2 MG/2ML IJ SOLN
INTRAMUSCULAR | Status: AC
  Filled 2018-01-01: qty 2

## 2018-01-01 MED ORDER — ATORVASTATIN CALCIUM 80 MG PO TABS
80.0000 mg | ORAL_TABLET | Freq: Every day | ORAL | Status: DC
  Administered 2018-01-01: 18:00:00 80 mg via ORAL
  Filled 2018-01-01 (×2): qty 1

## 2018-01-01 MED ORDER — AMLODIPINE BESYLATE 5 MG PO TABS
5.0000 mg | ORAL_TABLET | Freq: Every day | ORAL | Status: DC
  Administered 2018-01-01 – 2018-01-02 (×2): 5 mg via ORAL
  Filled 2018-01-01 (×2): qty 1

## 2018-01-01 MED ORDER — MIDAZOLAM HCL 2 MG/2ML IJ SOLN
INTRAMUSCULAR | Status: DC | PRN
  Administered 2018-01-01: 1 mg via INTRAVENOUS
  Administered 2018-01-01: 2 mg via INTRAVENOUS

## 2018-01-01 MED ORDER — ACETAMINOPHEN 325 MG PO TABS: 650.0000 mg | ORAL_TABLET | ORAL | Status: DC | PRN

## 2018-01-01 MED ORDER — SODIUM CHLORIDE 0.9 % IV SOLN: INTRAVENOUS | Status: AC

## 2018-01-01 MED ORDER — SODIUM CHLORIDE 0.9 % IV SOLN
INTRAVENOUS | Status: AC | PRN
  Administered 2018-01-01: 20 mL/h via INTRAVENOUS

## 2018-01-01 MED ORDER — PEN NEEDLES 32G X 4 MM MISC: 1.0000 [IU] | Freq: Every day | Status: DC

## 2018-01-01 MED ORDER — ONDANSETRON HCL 4 MG/2ML IJ SOLN: 4.0000 mg | Freq: Four times a day (QID) | INTRAMUSCULAR | Status: DC | PRN

## 2018-01-01 MED ORDER — IOPAMIDOL (ISOVUE-370) INJECTION 76%
INTRAVENOUS | Status: DC | PRN
  Administered 2018-01-01: 80 mL via INTRA_ARTERIAL

## 2018-01-01 MED ORDER — HEPARIN SODIUM (PORCINE) 5000 UNIT/ML IJ SOLN
5000.0000 [IU] | Freq: Three times a day (TID) | INTRAMUSCULAR | Status: DC
  Administered 2018-01-01 – 2018-01-02 (×2): 5000 [IU] via SUBCUTANEOUS
  Filled 2018-01-01 (×2): qty 1

## 2018-01-01 MED ORDER — LORATADINE 10 MG PO TABS
10.0000 mg | ORAL_TABLET | Freq: Every day | ORAL | Status: DC
  Administered 2018-01-01 – 2018-01-02 (×2): 10 mg via ORAL
  Filled 2018-01-01 (×2): qty 1

## 2018-01-01 MED ORDER — INSULIN GLARGINE 100 UNIT/ML ~~LOC~~ SOLN
10.0000 [IU] | Freq: Every day | SUBCUTANEOUS | Status: DC
  Administered 2018-01-01: 22:00:00 10 [IU] via SUBCUTANEOUS
  Filled 2018-01-01 (×2): qty 0.1

## 2018-01-01 MED ORDER — IOPAMIDOL (ISOVUE-370) INJECTION 76%
INTRAVENOUS | Status: AC
  Filled 2018-01-01: qty 125

## 2018-01-01 MED ORDER — NITROGLYCERIN 0.4 MG SL SUBL: 0.4000 mg | SUBLINGUAL_TABLET | SUBLINGUAL | Status: DC | PRN

## 2018-01-01 MED ORDER — LIDOCAINE HCL (PF) 1 % IJ SOLN
INTRAMUSCULAR | Status: AC
  Filled 2018-01-01: qty 30

## 2018-01-01 MED ORDER — HEPARIN SODIUM (PORCINE) 5000 UNIT/ML IJ SOLN
5000.0000 [IU] | Freq: Three times a day (TID) | INTRAMUSCULAR | Status: DC
  Administered 2018-01-01: 5000 [IU] via SUBCUTANEOUS
  Filled 2018-01-01: qty 1

## 2018-01-01 MED ORDER — OXYCODONE HCL 5 MG PO TABS
5.0000 mg | ORAL_TABLET | Freq: Three times a day (TID) | ORAL | Status: DC | PRN
  Administered 2018-01-01: 21:00:00 5 mg via ORAL
  Filled 2018-01-01: qty 1

## 2018-01-01 MED ORDER — SODIUM CHLORIDE 0.9 % IV SOLN: 250.0000 mL | INTRAVENOUS | Status: DC | PRN

## 2018-01-01 MED ORDER — HYDROCODONE-ACETAMINOPHEN 5-325 MG PO TABS: 1.0000 | ORAL_TABLET | Freq: Four times a day (QID) | ORAL | Status: DC | PRN

## 2018-01-01 MED ORDER — ASPIRIN EC 81 MG PO TBEC
81.0000 mg | DELAYED_RELEASE_TABLET | Freq: Every day | ORAL | Status: DC
  Administered 2018-01-01: 81 mg via ORAL
  Filled 2018-01-01 (×2): qty 1

## 2018-01-01 MED ORDER — SODIUM CHLORIDE 0.9% FLUSH
3.0000 mL | Freq: Two times a day (BID) | INTRAVENOUS | Status: DC
  Administered 2018-01-01: 3 mL via INTRAVENOUS

## 2018-01-01 MED ORDER — HEPARIN (PORCINE) IN NACL 2-0.9 UNIT/ML-% IJ SOLN
INTRAMUSCULAR | Status: AC
  Filled 2018-01-01: qty 1000

## 2018-01-01 SURGICAL SUPPLY — 9 items
CATH INFINITI 5FR MULTPACK ANG (CATHETERS) ×1 IMPLANT
COVER PRB 48X5XTLSCP FOLD TPE (BAG) IMPLANT
COVER PROBE 5X48 (BAG) ×2
KIT HEART LEFT (KITS) ×2 IMPLANT
PACK CARDIAC CATHETERIZATION (CUSTOM PROCEDURE TRAY) ×2 IMPLANT
SHEATH AVANTI 11CM 5FR (SHEATH) ×1 IMPLANT
TRANSDUCER W/STOPCOCK (MISCELLANEOUS) ×2 IMPLANT
TUBING CIL FLEX 10 FLL-RA (TUBING) ×2 IMPLANT
WIRE EMERALD 3MM-J .035X150CM (WIRE) ×1 IMPLANT

## 2018-01-01 NOTE — ED Notes (Signed)
ED Provider at bedside. 

## 2018-01-01 NOTE — ED Notes (Signed)
Patient transported to X-ray 

## 2018-01-01 NOTE — ED Notes (Signed)
Cardiology at bedside.

## 2018-01-01 NOTE — Interval H&P Note (Signed)
Cath Lab Visit (complete for each Cath Lab visit)  Clinical Evaluation Leading to the Procedure:   ACS: Yes.    Non-ACS:    Anginal Classification: CCS IV  Anti-ischemic medical therapy: Minimal Therapy (1 class of medications)  Non-Invasive Test Results: No non-invasive testing performed  Prior CABG: Previous CABG      History and Physical Interval Note:  01/01/2018 1:41 PM  [name]  has presented today for surgery, with the diagnosis of unstable angina  The various methods of treatment have been discussed with the patient and family. After consideration of risks, benefits and other options for treatment, the patient has consented to  Procedure(s): LEFT HEART CATH AND CORS/GRAFTS ANGIOGRAPHY (N/A) as a surgical intervention .  The patient's history has been reviewed, patient examined, no change in status, stable for surgery.  I have reviewed the patient's chart and labs.  Questions were answered to the patient's satisfaction.     Larae Grooms

## 2018-01-01 NOTE — Progress Notes (Signed)
Site area: Right groin a 5 french sheath was removed  Site Prior to Removal:  Level 0  Pressure Applied For 20 MINUTES    Bedrest Beginning at 1500p  Manual:   Yes.    Patient Status During Pull:  stable  Post Pull Groin Site:  Level 0  Post Pull Instructions Given:  Yes.    Post Pull Pulses Present:  Yes.    Dressing Applied:  Yes.    Comments:  VS remain stable

## 2018-01-01 NOTE — Progress Notes (Signed)
I came to the ED to do the echo, but was told the patient was scheduled for cath at this time.

## 2018-01-01 NOTE — H&P (Addendum)
CARDIOLOGY HISTORY AND PHYSICAL     Date: 01/01/2018 Admitting Physician: No admitting provider for patient encounter.  Chief Complaint:  Chest pain ____________________________________________________________________ History of Present Illness:  Victoria Eaton is a 57 y.o. old female with medical history noted below presents with acute complaint of chest pain.  She was recently seen in the ED with similar complaints.  She states earlier today she was weeding her garden and felt sudden onset of pressure in her chest which radiated up into her left jaw and down into her left arm.  This lasted several minutes and resolved after she went inside to rest.  The pain returned some time later in the evening as she was sitting outside talking with family members.  The pain was slightly more intense and she therefore called 911.  She was given ASA 39m x4 and on arrival to the ED her pain was gone.  She denies dyspnea, lightheadedness, or syncope.    Review of Systems:   Review of Systems:  GEN: no fever, chills, nausea, vomiting, weight change  HEENT: no vision or hearing changes  PULM: no coughing, SOB  CV: +chest pain (resolved), No palpitations, PND, orthopnea  GI: no abdominal pain  GU: no dysuria  EXT: no swelling  SKIN: no rashes  NEURO: no numbness or tingling  HEME: no bleeding or bruising  GYN: none  --12 point review systems- otherwise negative.  All other systems reviewed and are negative  Past Medical History:  Diagnosis Date  . Anxiety   . Arthritis    "maybe in my left foot" (10/14/2017)  . Bladder prolapse, female, acquired 09/14/2017  . Depression   . High cholesterol   . History of stomach ulcers   . Hypertension   . Myocardial infarction ([facility] 12/30/2004  . Type II diabetes mellitus ([facility]     Past Surgical History:  Procedure Laterality Date  . CARDIAC CATHETERIZATION  10/14/2017  . CORONARY ANGIOPLASTY WITH STENT PLACEMENT  12/30/2004   Patient reported  .  CORONARY ARTERY BYPASS GRAFT N/A 10/19/2017   Procedure: CORONARY ARTERY BYPASS GRAFTING (CABG) times four using the right saphaneous vein. Harvested endoscopicly and left internal mammary artery.;  Surgeon: GGrace Isaac MD;  [facility]  Service: Open Heart Surgery;  Laterality: N/A;  . DILATION AND CURETTAGE OF UTERUS  1980s  . LEFT HEART CATH AND CORONARY ANGIOGRAPHY N/A 10/14/2017   Procedure: LEFT HEART CATH AND CORONARY ANGIOGRAPHY;  Surgeon: JMartinique Peter M, MD;  [facility];  Service: Cardiovascular;  Laterality: N/A;  . PILONIDAL CYST EXCISION  1980s  . TEE WITHOUT CARDIOVERSION N/A 10/19/2017   Procedure: TRANSESOPHAGEAL ECHOCARDIOGRAM (TEE);  Surgeon: GGrace Isaac MD;  [facility]  Service: Open Heart Surgery;  Laterality: N/A;    Social History   Socioeconomic History  . Marital status: Married    Spouse name: CGlendell Docker . Number of children: 1  . Years of education: Not on file  . Highest education level: Not on file  Occupational History  . Not on file  Social Needs  . Financial resource strain: Not on file  . Food insecurity:    Worry: Not on file    Inability: Not on file  . Transportation needs:    Medical: Not on file    Non-medical: Not on file  Tobacco Use  . Smoking status: Former Smoker    Packs/day: 0.12    Years: 45.00    Pack years: 5.40  Types: Cigarettes    Start date: 05/01/1972    Last attempt to quit: 09/30/2017    Years since quitting: 0.2  . Smokeless tobacco: Never Used  Substance and Sexual Activity  . Alcohol use: Yes    Alcohol/week: 8.4 oz    Types: 14 Glasses of wine per week  . Drug use: No  . Sexual activity: Not Currently  Lifestyle  . Physical activity:    Days per week: Not on file    Minutes per session: Not on file  . Stress: Not on file  Relationships  . Social connections:    Talks on phone: Not on file    Gets together: Not on file    Attends religious service: Not on file    Active  member of club or organization: Not on file    Attends meetings of clubs or organizations: Not on file    Relationship status: Not on file  . Intimate partner violence:    Fear of current or ex partner: Not on file    Emotionally abused: Not on file    Physically abused: Not on file    Forced sexual activity: Not on file  Other Topics Concern  . Not on file  Social History Narrative  . Not on file    Family History  Problem Relation Age of Onset  . Heart disease Father   . Hyperlipidemia Father   . Hypertension Father     Past Cardiovascular History:  +CAD +MI - No documented h/o CHF - No documented h/o PVD - No documented h/o AAA - No documented h/o valvular heart disease - No documented h/o CVA - No documented h/o Arrhythmias - No documented h/o A-fib  - No documented h/o congenital heart disease +CABG - No documented h/o PCI - No documented h/o cardiac devices (Pacer/ICD/CRT) - No documented h/o cardiac surgery       Most recent stress test:   10/13/17  Horizontal ST segment depression ST segment depression was noted during stress in the I leads.  No T wave inversion was noted during stress.  The left ventricular ejection fraction is moderately decreased (30-44%).  This is a high risk study.  Findings consistent with prior myocardial infarction.  Most recent echocardiography:   10/12/17 - Left ventricle: Septal apical and mid/apical inferior wall   hypokinesis. The cavity size was moderately dilated. Wall   thickness was increased in a pattern of moderate LVH. Systolic   function was normal. The estimated ejection fraction was 40%.   Wall motion was normal; there were no regional wall motion   abnormalities. Left ventricular diastolic function parameters   were normal. - Mitral valve: There was mild regurgitation. - Left atrium: The atrium was mildly dilated. - Atrial septum: A patent foramen ovale cannot be excluded.  Most recent left heart  catheterization:   10/14/17  Prox RCA lesion is 80% stenosed.  Mid RCA to Dist RCA lesion is 70% stenosed.  Dist RCA lesion is 70% stenosed.  Prox LAD to Mid LAD lesion is 90% stenosed.  Mid LAD to Dist LAD lesion is 85% stenosed.  Ost 1st Diag lesion is 80% stenosed.  Lat 1st Diag lesion is 90% stenosed.  1st Diag lesion is 90% stenosed.  Ost 1st Mrg to 1st Mrg lesion is 90% stenosed.  There is moderate left ventricular systolic dysfunction.  LV end diastolic pressure is mildly elevated.  The left ventricular ejection fraction is 35-45% by visual estimate.  CABG:  Coronary artery bypass grafting x4 with left internal mammary to the left anterior descending coronary artery, reversed saphenous vein graft to the first diagonal coronary artery, reversed saphenous vein graft to the obtuse marginal coronary artery, reversed saphenous vein graft to the distal right coronary artery with right greater saphenous endoscopic vein harvesting from the thigh and calf.    Device history:  None  Prior to Admission medications   Medication Sig Start Date End Date Taking? Authorizing Provider  amLODipine (NORVASC) 5 MG tablet Take 1 tablet (5 mg total) by mouth daily. 12/07/17   Shawnee Knapp, MD  aspirin EC 81 MG EC tablet Take 1 tablet (81 mg total) by mouth daily. 10/25/17   Gold, Wayne E, PA-C  atorvastatin (LIPITOR) 40 MG tablet Take 40 mg by mouth daily. 12/05/17   [provider]  atorvastatin (LIPITOR) 80 MG tablet Take 1 tablet (80 mg total) by mouth daily at 6 PM. 12/07/17   Shawnee Knapp, MD  buPROPion (WELLBUTRIN XL) 150 MG 24 hr tablet TAKE ONE TABLET BY MOUTH ONE TIME DAILY 11/09/17   Tenna Delaine D, PA-C  EPINEPHrine (EPIPEN 2-PAK) 0.3 mg/0.3 mL IJ SOAJ injection Inject 0.3 mLs (0.3 mg total) into the muscle once as needed. Patient taking differently: Inject 0.3 mg into the muscle once as needed (for an allergic reaction).  05/20/17   Tenna Delaine D, PA-C    FARXIGA 10 MG TABS tablet TAKE ONE TABLET BY MOUTH ONE TIME DAILY 11/16/17   Tenna Delaine D, PA-C  HYDROcodone-acetaminophen (NORCO) 5-325 MG tablet Take 1 tablet by mouth every 6 (six) hours as needed for moderate pain. 11/25/17   Horald Pollen, MD  Insulin Glargine (LANTUS SOLOSTAR) 100 UNIT/ML Solostar Pen Inject 10 Units into the skin daily at 10 pm. Increase dose by 2u every 3 days until fasting a.m. cbgs <150 12/07/17   Shawnee Knapp, MD  Insulin Pen Needle (PEN NEEDLES) 32G X 4 MM MISC 1 Units by Does not apply route daily. 12/07/17   Shawnee Knapp, MD  lisinopril (PRINIVIL,ZESTRIL) 20 MG tablet Take 1 tablet (20 mg total) by mouth daily. 12/07/17   Shawnee Knapp, MD  loratadine (CLARITIN) 10 MG tablet Take 1 tablet (10 mg total) by mouth daily. 05/01/17   Tenna Delaine D, PA-C  metoprolol tartrate (LOPRESSOR) 25 MG tablet Take 1 tablet (25 mg total) by mouth 2 (two) times daily. 10/26/17   Tenna Delaine D, PA-C  Multiple Vitamin (MULTIVITAMIN WITH MINERALS) TABS tablet Take 1 tablet by mouth daily.    [provider]  Omega-3 Fatty Acids (FISH OIL CONCENTRATE) 300 MG CAPS Take 1 capsule by mouth daily.    [provider]  oxyCODONE (OXY IR/ROXICODONE) 5 MG immediate release tablet Take 1 tablet (5 mg total) by mouth every 8 (eight) hours as needed for severe pain. 11/23/17   Barrett, Erin R, PA-C  sertraline (ZOLOFT) 50 MG tablet TAKE ONE TABLET BY MOUTH ONE TIME DAILY 12/04/17   Shawnee Knapp, MD    Allergies  Allergen Reactions  . Januvia [Sitagliptin] Nausea And Vomiting and Other (See Comments)    Pancreatitis (this was in Epic, but patient was unaware of this)   . Kiwi Extract Hives  . Shrimp [Shellfish Allergy] Hives  . Metformin And Related Diarrhea  . Penicillins Other (See Comments)    From childhood: Has patient had a PCN reaction causing immediate rash, facial/tongue/throat swelling, SOB or lightheadedness with hypotension: Unk Has patient had  a PCN  reaction causing severe rash involving mucus membranes or skin necrosis: Unk Has patient had a PCN reaction that required hospitalization: Unk Has patient had a PCN reaction occurring within the last 10 years: No If all of the above answers are "NO", then may proceed with Cephalosporin use.     Social History:   Social History   Tobacco Use  . Smoking status: Former Smoker    Packs/day: 0.12    Years: 45.00    Pack years: 5.40    Types: Cigarettes    Start date: 05/01/1972    Last attempt to quit: 09/30/2017    Years since quitting: 0.2  . Smokeless tobacco: Never Used  Substance Use Topics  . Alcohol use: Yes    Alcohol/week: 8.4 oz    Types: 14 Glasses of wine per week  . Drug use: No    Family History  Problem Relation Age of Onset  . Heart disease Father   . Hyperlipidemia Father   . Hypertension Father     Physical Examination: Blood pressure (!) 121/59, pulse 66, temperature 98.6 F (37 C), temperature source Oral, resp. rate 17, height 5' 6"  (1.676 m), weight 71.2 kg (157 lb), SpO2 94 %. General:  AAOX 4.  NAD.  NRD.  HENT: Normocephalic. Atraumatic.  No acute abnom. EYES: PERRL EOMI  Neck: Supple.  No JVD.  No bruits. Cardiovascular:  Nl S1. Nl S2. No S3. No S4. Nl PMI. No m/r/c. RRR  Pulmonary/Chest: CTA B. No rales. No wheezing.  Abdomen: Soft, NT, no masses, no organomegaly. Neuro: CN intact, no motor/sensory deficit.  Ext: Warm. No edema.  SKIN- intact  No intake or output data in the 24 hours ending 01/01/18 0501  Troponin (Point of Care Test) Recent Labs    01/01/18 0324  TROPIPOC 0.04   ____________________________________________________________________ Assessment/Plan  Chest Pain   Assessment:  Patient with second presentation to the ED for chest pain in the last month.  She is currently pain free, with no ECG changes or troponin elevation.  I suspect this is pain secondary to her recent CABG.  However, give her risk factors and her repeat  presentations a stress test is not unreasonable to evaluate her pain.     Plan  -  Continue home medical management  -  ECHO  -  Plan for stress test  -  Follow-up with her out patient cardiologist at discharge  DM   Continue home medical management  HTN   Stable.  Continue home regiment  Thank you for consulting cardiology.    Electronically signed by Lowella Dandy 01/01/2018 Baruch Merl, MD, PhD Cardiology   ATTENDING ADDENDUM:   I saw and evaluated Victoria Eaton this morning in the ER.  She was resting comfortably with no further chest pain.   Arihana had 2 separate episodes yesterday (her birthday) -  1 while doing gardening work, and this other was after eating dessert.  She does note that yesterday was somewhat stressful day, however she described having a sensation of a tightness across her chest that radiated into her jaw and left arm.  The first episode while gardening did go away with rest.  The second episode was more at rest and a little bit more intense.  This is what led her to come to the ER.  At this time she noted having some lability of her blood pressures.  It was hard to tell if her pain was the source of the blood pressure  or the other way around.  Initially her blood pressures are running 180s and she deftly had significant symptoms.  However she continued to check her pressures and they went down to the 140s and she still had symptoms.  At this point, this is a second time that she has been to the emergency room for chest pain.  She clearly indicates that this is a different type and intensity of of discomfort than she had the first episode in March.  She indicates that this is more similar to the symptoms that she was having prior to her stress test and cath back in January.  My major concern here is that her symptoms are somewhat concerning for unstable angina in a patient who is just not quite 3 months post CABG.  She had a Myoview done prior to her heart cath and CABG  that showed a relatively sizable infarct with some peri-infarct ischemia and reduced ejection fraction which led to the study being read as intermediate to high risk.  I am worried that if we were to repeat a Myoview it would be a somewhat similar result and would not be overly helpful as far as risk stratification.   She has ruled out with negative troponins, and her EKG is essentially unchanged from March, albeit still not a normal looking EKG with some evolutionary changes from her January EKG.  Concerning features that she is now having symptoms that are concerning for unstable angina and is seeking some resolution. I discussed options of adjusting medications including titration of her blood pressure medications and adding sublingual nitroglycerin and letting her go home today to be followed up with Dr. Debara Pickett.  If she were to have recurrent symptoms we would then consider invasive evaluation.  The other option would be To seek definitive resolution with cardiac catheterization and coronary/graft angiography followed by medical management if grafts are patent.  However if the graft is closed and this would validate her symptoms and we can make a decision then as to whether we were to proceed with PCI.  After detailed discussion of the procedure along with its risks, benefits and alternatives/indications, the patient has indicated that she would pursue prefer to move forward with invasive evaluation via cardiac catheterization to answer the question.  Procedure:  LEFT HEART CATH WITH CORONARY AND GRAFT ANGIOGRAPHY and POSSIBLE PERCUTANEOUS CORONARY INTERVENTION  The procedure with Risks/Benefits/Alternatives and Indications was reviewed with the patient.  All questions were answered.    Risks / Complications include, but not limited to: Death, MI, CVA/TIA, VF/VT (with defibrillation), Bradycardia (need for temporary pacer placement), contrast induced nephropathy, bleeding / bruising / hematoma /  pseudoaneurysm, vascular or coronary injury (with possible emergent CT or Vascular Surgery), adverse medication reactions, infection.  Additional risks involving the use of radiation with the possibility of radiation burns and cancer were explained in detail. I did acknowledge the increased risks of these complications in a patient with vein graft angiography and possible PCI either vein graft or diffusely diseased vessel.  The patient voices understanding and agree to proceed.      Glenetta Hew, M.D., M.S. Interventional Cardiologist   Pager # [phone] Phone # [phone] [address]. [place], [place] [address]

## 2018-01-01 NOTE — ED Notes (Signed)
ORDERED DIET TRAY

## 2018-01-02 ENCOUNTER — Other Ambulatory Visit (HOSPITAL_COMMUNITY): Payer: BLUE CROSS/BLUE SHIELD

## 2018-01-02 DIAGNOSIS — E785 Hyperlipidemia, unspecified: Secondary | ICD-10-CM

## 2018-01-02 DIAGNOSIS — I2584 Coronary atherosclerosis due to calcified coronary lesion: Secondary | ICD-10-CM | POA: Diagnosis not present

## 2018-01-02 DIAGNOSIS — I25119 Atherosclerotic heart disease of native coronary artery with unspecified angina pectoris: Secondary | ICD-10-CM | POA: Diagnosis not present

## 2018-01-02 DIAGNOSIS — Z955 Presence of coronary angioplasty implant and graft: Secondary | ICD-10-CM | POA: Diagnosis not present

## 2018-01-02 DIAGNOSIS — I25710 Atherosclerosis of autologous vein coronary artery bypass graft(s) with unstable angina pectoris: Secondary | ICD-10-CM | POA: Diagnosis not present

## 2018-01-02 DIAGNOSIS — I252 Old myocardial infarction: Secondary | ICD-10-CM | POA: Diagnosis not present

## 2018-01-02 DIAGNOSIS — I2582 Chronic total occlusion of coronary artery: Secondary | ICD-10-CM | POA: Diagnosis not present

## 2018-01-02 DIAGNOSIS — Z7982 Long term (current) use of aspirin: Secondary | ICD-10-CM | POA: Diagnosis not present

## 2018-01-02 DIAGNOSIS — I10 Essential (primary) hypertension: Secondary | ICD-10-CM | POA: Diagnosis not present

## 2018-01-02 DIAGNOSIS — F419 Anxiety disorder, unspecified: Secondary | ICD-10-CM | POA: Diagnosis not present

## 2018-01-02 DIAGNOSIS — F329 Major depressive disorder, single episode, unspecified: Secondary | ICD-10-CM | POA: Diagnosis not present

## 2018-01-02 DIAGNOSIS — M1990 Unspecified osteoarthritis, unspecified site: Secondary | ICD-10-CM | POA: Diagnosis not present

## 2018-01-02 DIAGNOSIS — E119 Type 2 diabetes mellitus without complications: Secondary | ICD-10-CM | POA: Diagnosis not present

## 2018-01-02 DIAGNOSIS — Z87891 Personal history of nicotine dependence: Secondary | ICD-10-CM | POA: Diagnosis not present

## 2018-01-02 DIAGNOSIS — E7800 Pure hypercholesterolemia, unspecified: Secondary | ICD-10-CM | POA: Diagnosis not present

## 2018-01-02 DIAGNOSIS — Z8249 Family history of ischemic heart disease and other diseases of the circulatory system: Secondary | ICD-10-CM | POA: Diagnosis not present

## 2018-01-02 DIAGNOSIS — Z794 Long term (current) use of insulin: Secondary | ICD-10-CM | POA: Diagnosis not present

## 2018-01-02 DIAGNOSIS — Z880 Allergy status to penicillin: Secondary | ICD-10-CM | POA: Diagnosis not present

## 2018-01-02 LAB — CBC
HCT: 34.4 % — ABNORMAL LOW (ref 36.0–46.0)
HEMOGLOBIN: 10.9 g/dL — AB (ref 12.0–15.0)
MCH: 24.2 pg — ABNORMAL LOW (ref 26.0–34.0)
MCHC: 31.7 g/dL (ref 30.0–36.0)
MCV: 76.4 fL — ABNORMAL LOW (ref 78.0–100.0)
PLATELETS: 201 10*3/uL (ref 150–400)
RBC: 4.5 MIL/uL (ref 3.87–5.11)
RDW: 16.5 % — ABNORMAL HIGH (ref 11.5–15.5)
WBC: 7.7 10*3/uL (ref 4.0–10.5)

## 2018-01-02 LAB — BASIC METABOLIC PANEL
ANION GAP: 9 (ref 5–15)
BUN: 14 mg/dL (ref 6–20)
CALCIUM: 9.1 mg/dL (ref 8.9–10.3)
CO2: 24 mmol/L (ref 22–32)
Chloride: 103 mmol/L (ref 101–111)
Creatinine, Ser: 0.84 mg/dL (ref 0.44–1.00)
Glucose, Bld: 154 mg/dL — ABNORMAL HIGH (ref 65–99)
Potassium: 3.7 mmol/L (ref 3.5–5.1)
SODIUM: 136 mmol/L (ref 135–145)

## 2018-01-02 LAB — GLUCOSE, CAPILLARY: Glucose-Capillary: 167 mg/dL — ABNORMAL HIGH (ref 65–99)

## 2018-01-02 MED ORDER — ISOSORBIDE MONONITRATE ER 30 MG PO TB24
30.0000 mg | ORAL_TABLET | Freq: Every day | ORAL | Status: DC
  Administered 2018-01-02: 30 mg via ORAL
  Filled 2018-01-02: qty 1

## 2018-01-02 MED ORDER — ASPIRIN EC 325 MG PO TBEC
325.0000 mg | DELAYED_RELEASE_TABLET | Freq: Every day | ORAL | Status: DC
  Administered 2018-01-02: 325 mg via ORAL
  Filled 2018-01-02: qty 1

## 2018-01-02 MED ORDER — ASPIRIN 325 MG PO TBEC: 325.0000 mg | DELAYED_RELEASE_TABLET | Freq: Every day | ORAL | 0 refills | Status: DC

## 2018-01-02 MED ORDER — ISOSORBIDE MONONITRATE ER 30 MG PO TB24: 30.0000 mg | ORAL_TABLET | Freq: Every day | ORAL | 3 refills | Status: DC

## 2018-01-02 NOTE — Discharge Summary (Addendum)
Discharge Summary    Patient ID: Victoria Eaton,  MRN: [id number], DOB/AGE: April 27, 1961 57 y.o.  Admit date: 12/31/2017 Discharge date: 01/02/2018  Primary Care Provider: Shawnee Knapp Primary Cardiologist: Pixie Casino, MD   Discharge Diagnoses    Principal Problem:   Unstable angina [facility]) Active Problems:   Type 2 diabetes mellitus without complication, without long-term current use of insulin (HCC)   Essential hypertension   History of MI (myocardial infarction)   Hyperlipidemia LDL goal <70   Coronary artery disease involving native coronary artery of native heart with angina pectoris ([place])   Chest pain    History of Present Illness    Victoria Eaton is a 57 y.o. female with past medical history of CAD (s/p PCI to RCA in 2009 and recent CABG in 09/2017 with LIMA-LAD, reverse SVG-D1, reverse SVG-OM, and reverse SVG-RCA), HTN, HLD, and depression who presented to [facility] ED on 12/31/2017 for evaluation of chest pain which started while working in her garden. Reported the pain radiated into her left jaw and left arm and resolved with rest. Throughout the day, she continued to experience similar symptoms, therefore she came to the ED for further evaluation.    Her initial troponin value was negative and EKG showed no acute ischemic changes, but given her recent CABG and concerning symptoms, she was admitted for further evaluation.   Hospital Course     Consultants: None   Her catheterization was performed on 01/01/2018 and showed severe native CAD with patent LIMA-LAD and patent SVG-PDA with occluded SVG-OM and SVG-D1 with the SVG-PDA providing collaterals to the occluded OM. Both of the vessels were small in caliber, therefore medical management was recommended with consideration of CTO PCI attempt of the OM if she develops refractory angina.   The following morning, she denied any repeat episodes of chest pain or dyspnea on exertion. Groin site was stable with no ecchymosis  or evidence of a hematoma. Repeat labs showed stable Hgb of 10.9 and creatinine at 0.84. She will continue on ASA (has been on ASA 64m daily since initial discharge from CABG --> I called CT Surgery and verified she SHOULD be on 3275mdaily), BB, Amlodipine, and statin therapy with Imdur 303maily being added to her medication regimen.   She was last examined by Dr. AllRayann Hemand deemed stable for discharge. A staff message has been sent to arrange for Cardiology follow-up within the next 2 weeks. She was discharged home in good condition.   _____________  Discharge Physical Examination and Vitals Blood pressure (!) 159/67, pulse 60, temperature 98.6 F (37 C), temperature source Oral, resp. rate 11, height 5' 6"  (1.676 m), weight 164 lb 0.4 oz (74.4 kg), SpO2 99 %.  Filed Weights   12/31/17 2314 12/31/17 2323 01/02/18 0325  Weight: 158 lb (71.7 kg) 157 lb (71.2 kg) 164 lb 0.4 oz (74.4 kg)    General: Pleasant, Caucasian female appearing in NAD Psych: Normal affect. Neuro: Alert and oriented X 3. Moves all extremities spontaneously. HEENT: Normal  Neck: Supple without bruits or JVD. Lungs:  Resp regular and unlabored, CTA without wheezing or rales. Heart: RRR no s3, s4, or murmurs. Abdomen: Soft, non-tender, non-distended, BS + x 4.  Extremities: No clubbing, cyanosis or edema. DP/PT/Radials 2+ and equal bilaterally. Groin site stable without ecchymosis or evidence of a hematoma.    Labs & Radiologic Studies     CBC Recent Labs    12/31/17 2316 01/02/18 0214  WBC  9.6 7.7  HGB 10.6* 10.9*  HCT 33.4* 34.4*  MCV 78.0 76.4*  PLT 190 161   Basic Metabolic Panel Recent Labs    12/31/17 2316 01/02/18 0214  NA 136 136  K 3.7 3.7  CL 104 103  CO2 23 24  GLUCOSE 263* 154*  BUN 16 14  CREATININE 0.91 0.84  CALCIUM 9.0 9.1   Liver Function Tests No results for input(s): AST, ALT, ALKPHOS, BILITOT, PROT, ALBUMIN in the last 72 hours. No results for input(s): LIPASE, AMYLASE  in the last 72 hours. Cardiac Enzymes No results for input(s): CKTOTAL, CKMB, CKMBINDEX, TROPONINI in the last 72 hours. BNP Invalid input(s): POCBNP D-Dimer No results for input(s): DDIMER in the last 72 hours. Hemoglobin A1C No results for input(s): HGBA1C in the last 72 hours. Fasting Lipid Panel No results for input(s): CHOL, HDL, LDLCALC, TRIG, CHOLHDL, LDLDIRECT in the last 72 hours. Thyroid Function Tests No results for input(s): TSH, T4TOTAL, T3FREE, THYROIDAB in the last 72 hours.  Invalid input(s): FREET3  Dg Chest 2 View  Result Date: 01/01/2018 CLINICAL DATA:  Chest pain radiating to the left arm. EXAM: CHEST - 2 VIEW COMPARISON:  CT and CXR from 12/16/2017 FINDINGS: Stable cardiomegaly with post CABG change. Mild aortic atherosclerosis at the arch without aneurysm. Lungs are clear. Acute osseous abnormality. IMPRESSION: Stable post CABG changes mild cardiomegaly with aortic atherosclerosis. No active pulmonary disease Electronically Signed   By: Ashley Royalty M.D.   On: 01/01/2018 01:31   Dg Chest 2 View  Result Date: 12/16/2017 CLINICAL DATA:  Pleuritic chest pain EXAM: CHEST - 2 VIEW COMPARISON:  11/25/2017 FINDINGS: CABG changes with mild cardiac enlargement. Negative for heart failure. Lungs are clear without infiltrate or effusion. IMPRESSION: No active cardiopulmonary disease. Electronically Signed   By: Franchot Gallo M.D.   On: 12/16/2017 14:05   Dg Forearm Left  Result Date: 12/07/2017 CLINICAL DATA:  Pain following fall EXAM: LEFT FOREARM - 2 VIEW COMPARISON:  None. FINDINGS: Frontal and lateral views were obtained. No fracture or dislocation. Joint spaces appear normal. No erosive change. IMPRESSION: No fracture or dislocation.  No appreciable arthropathy. Electronically Signed   By: Lowella Grip III M.D.   On: 12/07/2017 11:09   Ct Angio Chest Pe W And/or Wo Contrast  Result Date: 12/16/2017 CLINICAL DATA:  57 year old female with chest pain. Concern for  pulmonary embolism. EXAM: CT ANGIOGRAPHY CHEST WITH CONTRAST TECHNIQUE: Multidetector CT imaging of the chest was performed using the standard protocol during bolus administration of intravenous contrast. Multiplanar CT image reconstructions and MIPs were obtained to evaluate the vascular anatomy. CONTRAST:  129m ISOVUE-370 IOPAMIDOL (ISOVUE-370) INJECTION 76% COMPARISON:  Chest radiograph dated 12/16/2017 FINDINGS: Cardiovascular: There is mild cardiomegaly. No pericardial effusion. Coronary vascular calcification and CABG vascular clips. Mild atherosclerotic calcification of the aortic arch. The thoracic aorta is otherwise unremarkable for the degree of enhancement. There is no CT evidence of pulmonary embolism. Mediastinum/Nodes: No hilar or mediastinal adenopathy. The esophagus and the thyroid gland are grossly unremarkable. No mediastinal fluid collection. Lungs/Pleura: Minimal bibasilar atelectatic changes. There is no focal consolidation, pleural effusion, or pneumothorax. Mild paraseptal emphysema. The central airways are patent. Upper Abdomen: No acute abnormality. Musculoskeletal: Median sternotomy wires. No acute osseous pathology. Review of the MIP images confirms the above findings. IMPRESSION: No acute intrathoracic pathology. No CT evidence of pulmonary embolism. Electronically Signed   By: AAnner CreteM.D.   On: 12/16/2017 23:47   Dg Humerus Left  Result Date:  12/07/2017 CLINICAL DATA:  Pain following fall 2 weeks prior EXAM: LEFT HUMERUS - 2+ VIEW COMPARISON:  None. FINDINGS: Frontal and lateral views were obtained. No fracture or dislocation. Joint spaces appear normal. No erosive change. IMPRESSION: No fracture or dislocation.  No evident arthropathy. Electronically Signed   By: Lowella Grip III M.D.   On: 12/07/2017 11:08   US Abdomen Limited Ruq  Result Date: 12/17/2017 CLINICAL DATA:  57 year old female with right upper quadrant abdominal pain. EXAM: ULTRASOUND ABDOMEN  LIMITED RIGHT UPPER QUADRANT COMPARISON:  CT of the abdomen pelvis dated 10/10/2017 FINDINGS: Gallbladder: Multiple stones within the gallbladder. There is no gallbladder wall thickening or pericholecystic fluid. Evaluation however is limited due to shadowing caused by gallstones. Negative sonographic Murphy's sign. Common bile duct: Diameter: 5 mm Liver: The liver is unremarkable as visualized. Portal vein is patent on color Doppler imaging with normal direction of blood flow towards the liver. IMPRESSION: Cholelithiasis without sonographic evidence of acute cholecystitis. Electronically Signed   By: Anner Crete M.D.   On: 12/17/2017 01:59     Diagnostic Studies/Procedures     Cardiac Catheterization: 01/01/2018   Prox LAD to Mid LAD lesion is 90% stenosed. LIMA to LAD is patent.  Distal LAD to Dist LAD lesion is 85% stenosed, past the insertion of the LIMA.  Ost 1st Diag lesion is 80% stenosed. 1st Diag lesion is 90% stenosed. Lat 1st Diag lesion is 90% stenosed. SVG to diagonal is occluded.  Ost 1st Mrg to 1st Mrg lesion is 100% stenosed. SVG to OM is occluded.  Prox RCA lesion is 80% stenosed. Mid RCA to Dist RCA lesion is 70% stenosed. Dist RCA lesion is 70% stenosed. SVG to PDA is widely patent and provides collaterals to the occluded OM.  The left ventricular systolic function is normal.  LV end diastolic pressure is normal.  The left ventricular ejection fraction is 55-65% by visual estimate.  There is no aortic valve stenosis.   Continue medical therapy.  The grafts to LAD and RCA are patent.    The SVGs to the OM and diagonal are occluded.  Both of these vessels were small in caliber.   Could consider CTO PCI attempt of the OM if she has refractory angina.  However, given how small in caliber the OM vessel is, there may be long term patency issues of a DES.     Disposition   Pt is being discharged home today in good condition.  Follow-up Plans & Appointments     Follow-up Information    Hilty, Nadean Corwin, MD Follow up.   Specialty:  Cardiology Why:  The office will contact you within 2-3 business days to arrange follow-up. If you do not hear from them within that time frame, please call the number provided.  Contact information: [place] [phone]          Discharge Instructions    Diet - low sodium heart healthy   Complete by:  As directed    Discharge instructions   Complete by:  As directed    PLEASE REMEMBER TO BRING ALL OF YOUR MEDICATIONS TO EACH OF YOUR FOLLOW-UP OFFICE VISITS.  PLEASE ATTEND ALL SCHEDULED FOLLOW-UP APPOINTMENTS.   Activity: Increase activity slowly as tolerated. You may shower, but no soaking baths (or swimming) for 1 week. No driving for 24 hours. No lifting over 5 lbs for 1 week. No sexual activity for 1 week.   You May Return to Work:  in 1 week (if applicable)  Wound Care: You may wash cath site gently with soap and water. Keep cath site clean and dry. If you notice pain, swelling, bleeding or pus at your cath site, please call [phone].   Increase activity slowly   Complete by:  As directed       Discharge Medications     Medication List    TAKE these medications   amLODipine 5 MG tablet Commonly known as:  NORVASC Take 1 tablet (5 mg total) by mouth daily.   aspirin 325 MG EC tablet Take 1 tablet (325 mg total) by mouth daily. What changed:    medication strength  how much to take   atorvastatin 80 MG tablet Commonly known as:  LIPITOR Take 1 tablet (80 mg total) by mouth daily at 6 PM.   buPROPion 150 MG 24 hr tablet Commonly known as:  WELLBUTRIN XL TAKE ONE TABLET BY MOUTH ONE TIME DAILY   EPINEPHrine 0.3 mg/0.3 mL Soaj injection Commonly known as:  EPIPEN 2-PAK Inject 0.3 mLs (0.3 mg total) into the muscle once as needed. What changed:  reasons to take this   FARXIGA 10 MG Tabs tablet Generic drug:  dapagliflozin  propanediol TAKE ONE TABLET BY MOUTH ONE TIME DAILY   FISH OIL CONCENTRATE 300 MG Caps Take 1 capsule by mouth daily.   HYDROcodone-acetaminophen 5-325 MG tablet Commonly known as:  NORCO Take 1 tablet by mouth every 6 (six) hours as needed for moderate pain.   Insulin Glargine 100 UNIT/ML Solostar Pen Commonly known as:  LANTUS SOLOSTAR Inject 10 Units into the skin daily at 10 pm. Increase dose by 2u every 3 days until fasting a.m. cbgs <150   isosorbide mononitrate 30 MG 24 hr tablet Commonly known as:  IMDUR Take 1 tablet (30 mg total) by mouth daily.   lisinopril 20 MG tablet Commonly known as:  PRINIVIL,ZESTRIL Take 1 tablet (20 mg total) by mouth daily.   loratadine 10 MG tablet Commonly known as:  CLARITIN Take 1 tablet (10 mg total) by mouth daily. What changed:    when to take this  reasons to take this   metoprolol tartrate 25 MG tablet Commonly known as:  LOPRESSOR Take 1 tablet (25 mg total) by mouth 2 (two) times daily.   multivitamin with minerals Tabs tablet Take 1 tablet by mouth daily.   oxyCODONE 5 MG immediate release tablet Commonly known as:  Oxy IR/ROXICODONE Take 1 tablet (5 mg total) by mouth every 8 (eight) hours as needed for severe pain.   Pen Needles 32G X 4 MM Misc 1 Units by Does not apply route daily.   sertraline 50 MG tablet Commonly known as:  ZOLOFT TAKE ONE TABLET BY MOUTH ONE TIME DAILY   TURMERIC PO Take 1 tablet by mouth daily.         Allergies Allergies  Allergen Reactions  . Januvia [Sitagliptin] Nausea And Vomiting and Other (See Comments)    Pancreatitis (this was in Epic, but patient was unaware of this)   . Kiwi Extract Hives  . Shrimp [Shellfish Allergy] Hives  . Metformin And Related Diarrhea  . Penicillins Other (See Comments)    From childhood: Has patient had a PCN reaction causing immediate rash, facial/tongue/throat swelling, SOB or lightheadedness with hypotension: Unk Has patient had a PCN  reaction causing severe rash involving mucus membranes or skin necrosis: Unk Has patient had a PCN reaction that required hospitalization: Unk Has patient had a PCN  reaction occurring within the last 10 years: No If all of the above answers are "NO", then may proceed with Cephalosporin use.      Outstanding Labs/Studies   None  Duration of Discharge Encounter   Greater than 30 minutes including physician time.  Signed, Erma Heritage, PA-C 01/02/2018, 9:04 AM  I have seen, examined the patient, and reviewed the above assessment and plan.  Changes to above are made where necessary.  On exam, RRR.  No symptoms currently.  Medical management is advised.  Smoking cessation encouraged. DC to home with close outpatient follow-up.  Co Sign: Thompson Grayer, MD 01/02/2018 9:51 AM

## 2018-01-05 ENCOUNTER — Other Ambulatory Visit: Payer: Self-pay | Admitting: Family Medicine

## 2018-01-05 MED FILL — Heparin Sodium (Porcine) 2 Unit/ML in Sodium Chloride 0.9%: INTRAMUSCULAR | Qty: 1000 | Status: AC

## 2018-01-05 NOTE — Telephone Encounter (Signed)
Left VM re: refill request; pt made aware there are refills available.

## 2018-01-07 ENCOUNTER — Encounter: Payer: Self-pay | Admitting: Family Medicine

## 2018-01-07 ENCOUNTER — Ambulatory Visit (INDEPENDENT_AMBULATORY_CARE_PROVIDER_SITE_OTHER): Payer: BLUE CROSS/BLUE SHIELD | Admitting: Family Medicine

## 2018-01-07 ENCOUNTER — Other Ambulatory Visit: Payer: Self-pay

## 2018-01-07 VITALS — BP 102/60 | HR 97 | Temp 98.3°F | Resp 18 | Ht 66.0 in | Wt 158.8 lb

## 2018-01-07 DIAGNOSIS — M25512 Pain in left shoulder: Secondary | ICD-10-CM | POA: Diagnosis not present

## 2018-01-07 DIAGNOSIS — Z23 Encounter for immunization: Secondary | ICD-10-CM | POA: Diagnosis not present

## 2018-01-07 DIAGNOSIS — M25511 Pain in right shoulder: Secondary | ICD-10-CM

## 2018-01-07 DIAGNOSIS — E1165 Type 2 diabetes mellitus with hyperglycemia: Secondary | ICD-10-CM

## 2018-01-07 DIAGNOSIS — G8929 Other chronic pain: Secondary | ICD-10-CM

## 2018-01-07 DIAGNOSIS — F17200 Nicotine dependence, unspecified, uncomplicated: Secondary | ICD-10-CM

## 2018-01-07 LAB — GLUCOSE, POCT (MANUAL RESULT ENTRY): POC Glucose: 239 mg/dl — AB (ref 70–99)

## 2018-01-07 NOTE — Patient Instructions (Addendum)
Increase lantus insulin to 20u for several nights. If your blood sugars are still >150 after the next 3 nights, then increase to 24u lantus insulin.  Then continue to titrate up by 2u every 3 nights if your cbg >150 - goal is to get your morning blood sugar 80-130 but we may need to add in meal-time insulin to help with that.   IF you received an x-ray today, you will receive an invoice from [facility]. Please contact [facility] at [phone] with questions or concerns regarding your invoice.   IF you received labwork today, you will receive an invoice from [place]. Please contact [facility] at [phone] with questions or concerns regarding your invoice.   Our billing staff will not be able to assist you with questions regarding bills from these companies.  You will be contacted with the lab results as soon as they are available. The fastest way to get your results is to activate your My Chart account. Instructions are located on the last page of this paperwork. If you have not heard from [place] regarding the results in 2 weeks, please contact this office.     Insulin Treatment for Diabetes Diabetes (diabetes mellitus) is a long-term (chronic) disease. It occurs when the body does not properly use sugar (glucose) that is released from food after digestion. Glucose levels are controlled by a hormone called insulin, which is made in the pancreas.  If you have type 1 diabetes, the pancreas does not make any insulin, so you must take insulin.  If you have type 2 diabetes, you might need to take insulin along with other medicines. In type 2 diabetes, one or both of these problems may be present: ? The pancreas does not make enough insulin. ? Cells in the body do not respond properly to insulin that the body makes (insulin resistance).  You must use insulin correctly to control your diabetes. You must have some insulin in your body at all times. Insulin treatment varies depending  on your type of diabetes, your treatment goals, and your medical history. It is important for you to understand your insulin treatment plan so you can be an active partner in managing your diabetes. How is insulin given? Insulin can only be given through a shot (injection). It is injected using a syringe and needle, an insulin pen, a pump, or a jet injector. Your health care provider will:  Prescribe the amount and type of insulin that you need.  Tell you when you should inject your insulin.  Where on the body should insulin be injected? Insulin is injected into a layer of fatty tissue under the skin. Good places to inject insulin include:  Abdomen. Generally, the abdomen is the best place to inject insulin. However, you should avoid any area that is less than 2 inches (5 cm) from the belly button (navel).  Front and outer area of the upper thighs.  The back of the upper arms.  Upper buttocks.  It is important to:  Give your injection in a slightly different place each time. This helps to prevent irritation and improve absorption.  Avoid injecting into areas that have scar tissue.  Usually, you will give yourself insulin injections. Others can also be taught how to give you injections. You will use a special type of syringe that is made only for insulin. Some people may have an insulin pump that delivers insulin steadily through a tube (cannula) that is placed under the skin. What are the different types  of insulin? The following information is a general guide to different types of insulin. Specifics vary depending on the insulin product that your health care provider prescribes.  Rapid-acting insulin: ? Starts working quickly, in as little as 5 minutes. ? Can last for 4-6 hours, or sometimes longer. ? Works well when taken right before a meal to quickly lower blood glucose.  Short-acting insulin: ? Starts working in about 30 minutes. ? Can last for 6-10 hours. ? Should be taken  about 30 minutes before you start eating a meal.  Intermediate-acting insulin: ? Starts working in 1-2 hours. ? Lasts for about 10-18 hours. ? Lowers your blood glucose for a longer period of time but is not as effective for lowering blood glucose right after a meal.  Long-acting insulin: ? Mimics the small amount of insulin that your pancreas usually produces throughout the day. ? Should be used either one or two times a day. ? Is usually used in combination with other types of insulin or other medicines.  Concentrated insulin, or U-500 insulin: ? Contains a higher dose of insulin than most rapid-acting insulins. U-500 insulin has 5 times the amount of insulin per 1 mL. ? Should only be used with the special U-500 syringe or U-500 insulin pen. It is dangerous to use the wrong type of syringe with this insulin.  What are the side effects of insulin? Possible side effects of insulin treatment include:  Low blood glucose (hypoglycemia).  Weight gain.  High blood glucose (hyperglycemia).  Skin injury or irritation.  Some of these side effects can be caused by using improper injection technique. It is important to learn to inject insulin properly. What are common terms associated with insulin treatment? Some terms that you might hear include:  Basal insulin, or basal rate. This is the constant amount of insulin that needs to be present in your body to stabilize your blood glucose levels. People who have type 1 diabetes need basal insulin in a steady (continuous) dose 24 hours a day. ? Usually, intermediate-acting or long-acting insulin is used one or two times a day to manage basal insulin levels. ? Medicines that are taken by mouth may also be recommended to manage basal insulin levels.  Prandial insulin. This refers to meal-related insulin. ? Blood glucose rises quickly after a meal (postprandial). Rapid-acting or short-acting insulin can be used right before a meal (preprandial) to  quickly lower blood glucose. ? You may be instructed to adjust the amount of prandial insulin that you take depending on how much carbohydrate (starch) is in your meal.  Corrective insulin. This may also be called a correction dose or supplemental dose. This is a small amount of rapid-acting or short-acting insulin that can be used to lower blood glucose if it is too high. You may be instructed to check your blood glucose at certain times of the day and use corrective insulin as needed.  Tight control, or intensive therapy. This means keeping your blood glucose as close to your target as possible, and preventing it from getting too high after meals. People who have tight control of their diabetes have fewer long-term problems caused by diabetes.  General instructions  Talk with your health care provider or pharmacist about the type of insulin you should take and when you should take it. You should know when your insulin peaks and when it wears off. You need this information so you can plan your meals and exercise. You also need to work with your  health care provider to:  Check your blood glucose every day. Your health care provider will tell you how often and when you should do this.  Manage your: ? Weight. ? Blood pressure. ? Cholesterol. ? Stress.  Eat a healthy diet.  Exercise regularly.  This information is not intended to replace advice given to you by your health care provider. Make sure you discuss any questions you have with your health care provider. Document Released: 12/05/2008 Document Revised: 02/14/2016 Document Reviewed: 10/12/2015 Elsevier Interactive Patient Education  2018 [employer].  Complementary and Alternative Medical Therapies for Diabetes Complementary and alternative medical therapies are treatments that are different from typical medical treatments (Western treatments). "Complementary" means that the therapy is used with Western treatments. "Alternative"  means that the therapy is used instead of Western treatments. Are these therapies safe? Some of these therapies are usually safe. Others may be harmful. Often, there is not enough research to show how safe or effective a therapy is. If you want to try a complementary or alternative therapy, talk with your health care provider to make sure it is safe. What alternative or complementary therapies are used to treat diabetes? Acupuncture Acupuncture is the insertion of needles into certain places on the skin. This is done by a professional. It is often used to relieve long-term (chronic) pain, especially of the bones and joints. It may help you if you have painful nerve damage. Biofeedback Biofeedback helps you to become more aware of your body's response to pain. It also helps you to learn ways of dealing with pain. Biofeedback is about relaxing and reducing stress. An example of a biofeedback technique is guided imagery. This involves creating peaceful images in your mind. Chromium Chromium is a substance that can help improve how insulin works in the body. Chromium is in many foods, including whole grains, nuts, and egg yolks. Chromium may also be taken as a supplement. Taking chromium supplements may help to control diabetes, especially if you have a lack of chromium (deficiency) in your body. However, research has not proven this. If you have kidney problems, you should be careful with chromium supplements. American ginseng American ginseng is an herb that may lower glucose levels. It may also help lower A1C levels. More research is needed before recommendations for ginseng use can be made. Magnesium Magnesium is a mineral found in many foods, such as whole grains, nuts, and green leafy vegetables. Having low magnesium levels may make controlling blood glucose more difficult for people who have type 2 diabetes. Low magnesium levels may also contribute to certain diabetes complications. Getting more  magnesium and eating a high-fiber diet may reduce the risk of developing type 2 diabetes. Vanadium Vanadium is a compound found in small amounts in certain plants and animals. Some studies show that it improves glucose levels in animals with diabetes. In one study, people with diabetes were able to lower their insulin dosage when taking vanadium. More research about side effects and safe dosage levels is needed. Cinnamon Cinnamon may decrease insulin resistance, increase insulin production, and lower blood glucose levels. It may work best when used with diabetes medicines. Fenugreek Fenugreek is an herb whose seeds are often used in cooking. It may help lower blood glucose by decreasing carbohydrate absorption and increasing insulin production. Summary  Talk with your health care provider about complementary or alternative therapy for you. Some therapies may be appropriate for you, but others may cause side effects.  Follow your diabetes care plan as prescribed.  This information is not intended to replace advice given to you by your health care provider. Make sure you discuss any questions you have with your health care provider. Document Released: 07/06/2007 Document Revised: 09/24/2016 Document Reviewed: 09/24/2016 Elsevier Interactive Patient Education  2017 [employer].

## 2018-01-07 NOTE — Progress Notes (Addendum)
By signing my name below, I, Schuyler Bain, attest that this documentation has been prepared under the direction and in the presence of Dr. Laurey Arrow. Brigitte Pulse. Electronically Signed: Baldwin Jamaica, Medical Scribe 01/07/2018 at 2:02 PM.  Subjective:    Patient ID: Victoria Eaton, female    DOB: 1961/05/04, 57 y.o.   MRN: [id number] Chief Complaint  Patient presents with  . Medication Management    4 week follow up on new insulin. Pt states she is up to 16 units and her sugar doesn't go below 150.    HPI Victoria Eaton is a 57 y.o. female who presents to Primary Care at [facility] regarding her diabetes management and insulin units evaluation. I last saw her about one month prior. At that point her CBGs were very uncontrolled with fasting over 350 and lowest around 250 on Farxiga 10 and Glipizide 10 BID. Metformin not tolerated and Jenuvia was discontinued when she developed pancreatitis one month after starting it. Diabetic control is very important as pt had an MI with four vessel CABG in January. So started pt on Lantis 10 units with instructions to titrate up dose until fasting CBGs less than 150. Stop Glipizide and continue [place].   Pt was seen here approximately 3 weeks ago with CP radiating to back so sent to ER. Two weeks later she underwent repeat left heart cath.   Cardiac: The pt notes that two of her recent grafts failed, but that her heart created a new blood vessel. She also notes that she was offered cardiac rehab a few months ago but did not accept this due to not having insurance. She has since gotten insurance and is open to going to cardiac rehab.   Diabetes Management: The pt notes that she checks her glucose levels first thing in the morning, today was 201, and the lowest was at 158. She notes that it is not unusual for her to eat at 10pm. She is currently taking 16 units. She notes that she wakes up between 4:30am and 7:30am, and she checks her blood sugars as soon as she wakes up. She  notes using artificial sweetener and creamer in her coffee. She expresses no concerns of low blood sugars, fainting, dizziness, or any concerns with taking her medication.   The pt notes that her husband is having serious health issues which is causing her to have more stress.   Smoking: Pt is still smoking despite recent 4 vessel CABG and recent pancreatitis. Insurance doesn't cover screening CT. Pre-contemplative on quitting which seen secondary to depression but pt declined change to depression meds or increase in Lobutrin. The pt notes that she is not "100% quit" but has made substantial progress.  Shoulder Pain: She also notes that her shoulders have been hurting since last June. She describes the pain as within the joint but worse on the left side. She notes that AV duct to 45 degrees is painful, and that her shoulders make a clicking noise. She notes that the pain wakes her up at night.    Patient Active Problem List   Diagnosis Date Noted  . Chest pain 01/01/2018  . Unstable angina ([place]) 01/01/2018  . Arm contusion, left, initial encounter 11/25/2017  . Contusion of right knee 11/25/2017  . History of open heart surgery 10/19/2017  . Epigastric pain   . Coronary artery disease involving native coronary artery of native heart with angina pectoris ([place])   . Pancreatitis 10/10/2017  . Hyperlipidemia LDL goal <70 09/14/2017  .  Type 2 diabetes mellitus without complication, without long-term current use of insulin ([place]) 05/01/2017  . Essential hypertension 05/01/2017  . Anxiety and depression 05/01/2017  . Tobacco abuse 05/01/2017  . History of MI (myocardial infarction) 05/01/2017  . Allergic rhinitis 05/01/2017  . History of arthritis 05/01/2017  . Bladder prolapse, female, acquired 11/09/2013   Past Medical History:  Diagnosis Date  . Anxiety   . Arthritis    "maybe in my left foot" (10/14/2017)  . Bladder prolapse, female, acquired 09/14/2017  . Depression   . High  cholesterol   . History of stomach ulcers   . Hypertension   . Myocardial infarction ([place]) 12/30/2004  . Type II diabetes mellitus ([place])    Past Surgical History:  Procedure Laterality Date  . CARDIAC CATHETERIZATION  10/14/2017  . CORONARY ANGIOPLASTY WITH STENT PLACEMENT  12/30/2004   Patient reported  . CORONARY ARTERY BYPASS GRAFT N/A 10/19/2017   Procedure: CORONARY ARTERY BYPASS GRAFTING (CABG) times four using the right saphaneous vein. Harvested endoscopicly and left internal mammary artery.;  Surgeon: Grace Isaac, MD;  Location: [place];  Service: Open Heart Surgery;  Laterality: N/A;  . DILATION AND CURETTAGE OF UTERUS  1980s  . LEFT HEART CATH AND CORONARY ANGIOGRAPHY N/A 10/14/2017   Procedure: LEFT HEART CATH AND CORONARY ANGIOGRAPHY;  Surgeon: Martinique, Peter M, MD;  Location: [facility];  Service: Cardiovascular;  Laterality: N/A;  . LEFT HEART CATH AND CORS/GRAFTS ANGIOGRAPHY N/A 01/01/2018   Procedure: LEFT HEART CATH AND CORS/GRAFTS ANGIOGRAPHY;  Surgeon: Jettie Booze, MD;  Location: [facility];  Service: Cardiovascular;  Laterality: N/A;  . PILONIDAL CYST EXCISION  1980s  . TEE WITHOUT CARDIOVERSION N/A 10/19/2017   Procedure: TRANSESOPHAGEAL ECHOCARDIOGRAM (TEE);  Surgeon: Grace Isaac, MD;  Location: [place];  Service: Open Heart Surgery;  Laterality: N/A;   Allergies  Allergen Reactions  . Januvia [Sitagliptin] Nausea And Vomiting and Other (See Comments)    Pancreatitis (this was in Epic, but patient was unaware of this)   . Kiwi Extract Hives  . Shrimp [Shellfish Allergy] Hives  . Metformin And Related Diarrhea  . Penicillins Other (See Comments)    From childhood: Has patient had a PCN reaction causing immediate rash, facial/tongue/throat swelling, SOB or lightheadedness with hypotension: Unk Has patient had a PCN reaction causing severe rash involving mucus membranes or skin necrosis: Unk Has patient had a PCN reaction that  required hospitalization: Unk Has patient had a PCN reaction occurring within the last 10 years: No If all of the above answers are "NO", then may proceed with Cephalosporin use.    Prior to Admission medications   Medication Sig Start Date End Date Taking? Authorizing Provider  amLODipine (NORVASC) 5 MG tablet Take 1 tablet (5 mg total) by mouth daily. 12/07/17   Shawnee Knapp, MD  aspirin EC 325 MG EC tablet Take 1 tablet (325 mg total) by mouth daily. 01/02/18   Strader, Fransisco Hertz, PA-C  atorvastatin (LIPITOR) 80 MG tablet Take 1 tablet (80 mg total) by mouth daily at 6 PM. 12/07/17   Shawnee Knapp, MD  buPROPion (WELLBUTRIN XL) 150 MG 24 hr tablet TAKE ONE TABLET BY MOUTH ONE TIME DAILY 11/09/17   Tenna Delaine D, PA-C  EPINEPHrine (EPIPEN 2-PAK) 0.3 mg/0.3 mL IJ SOAJ injection Inject 0.3 mLs (0.3 mg total) into the muscle once as needed. Patient taking differently: Inject 0.3 mg into the muscle once as needed (for an allergic reaction).  05/20/17   Tenna Delaine D, PA-C  FARXIGA 10 MG TABS tablet TAKE ONE TABLET BY MOUTH ONE TIME DAILY 11/16/17   Tenna Delaine D, PA-C  HYDROcodone-acetaminophen (NORCO) 5-325 MG tablet Take 1 tablet by mouth every 6 (six) hours as needed for moderate pain. 11/25/17   Horald Pollen, MD  Insulin Glargine (LANTUS SOLOSTAR) 100 UNIT/ML Solostar Pen Inject 10 Units into the skin daily at 10 pm. Increase dose by 2u every 3 days until fasting a.m. cbgs <150 12/07/17   Shawnee Knapp, MD  Insulin Pen Needle (PEN NEEDLES) 32G X 4 MM MISC 1 Units by Does not apply route daily. 12/07/17   Shawnee Knapp, MD  isosorbide mononitrate (IMDUR) 30 MG 24 hr tablet Take 1 tablet (30 mg total) by mouth daily. 01/02/18   Strader, Fransisco Hertz, PA-C  lisinopril (PRINIVIL,ZESTRIL) 20 MG tablet Take 1 tablet (20 mg total) by mouth daily. 12/07/17   Shawnee Knapp, MD  loratadine (CLARITIN) 10 MG tablet Take 1 tablet (10 mg total) by mouth daily. Patient taking differently: Take 10 mg by  mouth daily as needed for allergies.  05/01/17   Tenna Delaine D, PA-C  metoprolol tartrate (LOPRESSOR) 25 MG tablet Take 1 tablet (25 mg total) by mouth 2 (two) times daily. 10/26/17   Tenna Delaine D, PA-C  Multiple Vitamin (MULTIVITAMIN WITH MINERALS) TABS tablet Take 1 tablet by mouth daily.    [provider]  Omega-3 Fatty Acids (FISH OIL CONCENTRATE) 300 MG CAPS Take 1 capsule by mouth daily.    [provider]  oxyCODONE (OXY IR/ROXICODONE) 5 MG immediate release tablet Take 1 tablet (5 mg total) by mouth every 8 (eight) hours as needed for severe pain. 11/23/17   Barrett, Erin R, PA-C  sertraline (ZOLOFT) 50 MG tablet TAKE ONE TABLET BY MOUTH ONE TIME DAILY 12/04/17   Shawnee Knapp, MD  TURMERIC PO Take 1 tablet by mouth daily.    [provider]   Family History  Problem Relation Age of Onset  . Heart disease Father   . Hyperlipidemia Father   . Hypertension Father     Social History   Socioeconomic History  . Marital status: Married    Spouse name: Glendell Docker  . Number of children: 1  . Years of education: Not on file  . Highest education level: Not on file  Occupational History  . Not on file  Social Needs  . Financial resource strain: Not on file  . Food insecurity:    Worry: Not on file    Inability: Not on file  . Transportation needs:    Medical: Not on file    Non-medical: Not on file  Tobacco Use  . Smoking status: Former Smoker    Packs/day: 0.12    Years: 45.00    Pack years: 5.40    Types: Cigarettes    Start date: 05/01/1972    Last attempt to quit: 09/30/2017    Years since quitting: 0.2  . Smokeless tobacco: Never Used  Substance and Sexual Activity  . Alcohol use: Yes    Alcohol/week: 8.4 oz    Types: 14 Glasses of wine per week  . Drug use: No  . Sexual activity: Not Currently  Lifestyle  . Physical activity:    Days per week: Not on file    Minutes per session: Not on file  . Stress: Not on file  Relationships  .  Social connections:    Talks on phone:  Not on file    Gets together: Not on file    Attends religious service: Not on file    Active member of club or organization: Not on file    Attends meetings of clubs or organizations: Not on file    Relationship status: Not on file  . Intimate partner violence:    Fear of current or ex partner: Not on file    Emotionally abused: Not on file    Physically abused: Not on file    Forced sexual activity: Not on file  Other Topics Concern  . Not on file  Social History Narrative  . Not on file   Depression screen [facility] 2/9 01/07/2018 12/16/2017 12/07/2017 11/25/2017 10/27/2017  Decreased Interest 0 1 3 0 2  Down, Depressed, Hopeless 0 1 3 1 1   PHQ - 2 Score 0 2 6 1 3   Altered sleeping - 0 1 0 1  Tired, decreased energy - 2 3 3 3   Change in appetite - 1 2 0 3  Feeling bad or failure about yourself  - 2 2 0 2  Trouble concentrating - 2 3 2 3   Moving slowly or fidgety/restless - 0 3 0 2  Suicidal thoughts - 0 0 0 0  PHQ-9 Score - 9 20 6 17   Difficult doing work/chores - Somewhat difficult Somewhat difficult - -    Review of Systems  Constitutional: Negative for chills and fever.  HENT: Negative for facial swelling, rhinorrhea and sneezing.   Neurological: Negative for dizziness, syncope and light-headedness.       Objective:   Physical Exam  Constitutional: She is oriented to person, place, and time. She appears well-developed and well-nourished. No distress.  HENT:  Head: Normocephalic and atraumatic.  Right Ear: External ear normal.  Left Ear: External ear normal.  Eyes: Conjunctivae are normal. No scleral icterus.  Neck: Normal range of motion. Neck supple. No thyromegaly present.  Cardiovascular: Normal rate, regular rhythm, normal heart sounds and intact distal pulses.  Pulmonary/Chest: Effort normal and breath sounds normal. No respiratory distress. She has no wheezes. She has no rales.  Musculoskeletal: She exhibits no edema.    Lymphadenopathy:    She has cervical adenopathy.    She has no axillary adenopathy.  Neurological: She is alert and oriented to person, place, and time.  Skin: Skin is warm and dry. She is not diaphoretic. No erythema.  Psychiatric: She has a normal mood and affect. Her behavior is normal. Judgment and thought content normal.   Vitals:   01/07/18 1405  BP: 102/60  Pulse: 97  Resp: 18  Temp: 98.3 F (36.8 C)  TempSrc: Oral  SpO2: 98%  Weight: 158 lb 12.8 oz (72 kg)  Height: 5' 6"  (1.676 m)    Assessment & Plan:   Pt will need to f/u with general surgery to discuss possible cholcystectomy when released by cardiology as it was suspected that recent pancreatitis was biliary in nature.   1. Type 2 diabetes mellitus with hyperglycemia, without long-term current use of insulin (HCC) - Increase lantus insulin to 20u for several nights. If your blood sugars are still >150 after the next 3 nights, then increase to 24u lantus insulin.  Then continue to titrate up by 2u every 3 nights if your cbg >150 - goal is to get your morning blood sugar 80-130 but we may need to add in meal-time insulin to help with that.  2. Tobacco use disorder   3. Chronic pain of  both shoulders     Orders Placed This Encounter  Procedures  . Pneumococcal polysaccharide vaccine 23-valent greater than or equal to 2yo subcutaneous/IM  . POCT glucose (manual entry)     I personally performed the services described in this documentation, which was scribed in my presence. The recorded information has been reviewed and considered, and addended by me as needed.   [name], M.D.  Primary Care at [facility] [address] [place], [place] [address] [phone] phone [phone] fax  02/04/18 1:43 PM

## 2018-01-11 ENCOUNTER — Telehealth (HOSPITAL_COMMUNITY): Payer: Self-pay

## 2018-01-11 NOTE — Telephone Encounter (Signed)
Patients insurance is active and benefits verified through [facility] - No co-pay, deductible amount of $6,750/$6,750 has been met, out of pocket amount of $6,750/$6,750 has been met, no co-insurance, and no pre-authorization is required. Reference #[id number]

## 2018-01-19 ENCOUNTER — Encounter: Payer: Self-pay | Admitting: Physician Assistant

## 2018-01-19 ENCOUNTER — Ambulatory Visit (INDEPENDENT_AMBULATORY_CARE_PROVIDER_SITE_OTHER): Payer: BLUE CROSS/BLUE SHIELD | Admitting: Physician Assistant

## 2018-01-19 VITALS — BP 102/60 | HR 81 | Ht 66.0 in | Wt 164.6 lb

## 2018-01-19 DIAGNOSIS — I10 Essential (primary) hypertension: Secondary | ICD-10-CM | POA: Diagnosis not present

## 2018-01-19 DIAGNOSIS — R079 Chest pain, unspecified: Secondary | ICD-10-CM

## 2018-01-19 DIAGNOSIS — E119 Type 2 diabetes mellitus without complications: Secondary | ICD-10-CM | POA: Diagnosis not present

## 2018-01-19 DIAGNOSIS — I25708 Atherosclerosis of coronary artery bypass graft(s), unspecified, with other forms of angina pectoris: Secondary | ICD-10-CM

## 2018-01-19 DIAGNOSIS — E785 Hyperlipidemia, unspecified: Secondary | ICD-10-CM

## 2018-01-19 MED ORDER — NITROGLYCERIN 0.4 MG SL SUBL: 0.4000 mg | SUBLINGUAL_TABLET | SUBLINGUAL | 3 refills | Status: DC | PRN

## 2018-01-19 MED ORDER — LISINOPRIL 10 MG PO TABS: 10.0000 mg | ORAL_TABLET | Freq: Every day | ORAL | 3 refills | Status: DC

## 2018-01-19 MED ORDER — ISOSORBIDE MONONITRATE ER 60 MG PO TB24: 60.0000 mg | ORAL_TABLET | Freq: Every day | ORAL | 3 refills | Status: DC

## 2018-01-19 NOTE — Progress Notes (Signed)
Cardiology Office Note    Date:  01/21/2018   ID:  Victoria Eaton, DOB [birth year], MRN [id number]  PCP:  Victoria Knapp, Eaton  Cardiologist:  Victoria Eaton  Chief Complaint  Patient presents with  . Hospitalization Follow-up    pt complains of chest pains, painful bilateral arms, high blood pressure accompanied by SOB, denies SOB currently, and swelling in hands/feet    History of Present Illness:  Victoria Eaton is a 57 y.o. female with past medical history of CAD, type II DM, hypertension, hyperlipidemia and depression.  She had a remote RCA stent.  In January 2019, she underwent cardiac catheterization which showed multivessel disease and subsequently underwent CABG x4 (LIMA to LAD, reverse SVG to D1, reverse SVG to OM, reverse SVG to distal RCA.  Postoperative course was complicated by small pleural effusion and left apical pneumothorax on x-ray.  She was restarted on lisinopril and amlodipine in the setting of hypertension.  For post hospital follow-up, patient was seen by Victoria Eaton on 11/10/2017, she was still very fatigued at that time.  More recently, patient was admitted to the hospital on 01/01/2018 with sudden onset of chest pressure radiating to the left jaw and down left arm.  Symptom was concerning for unstable angina, therefore patient under went to relook cardiac catheterization which showed patent LIMA to LAD with severe disease in the proximal LAD area, 85% distal LAD after the insertion of LIMA graft.  SVG to the diagonal was occluded, ostial diagonal had 80% lesion, SVG to OM was occluded with 100% ostial OM lesion, SVG to PDA was widely patent and provide collaterals to the occluded OM.  Both the occluded OM and the diagonal were small vessels.  Although CTO PCI of OM can be consider however given the small caliber of the vessel, there may be long-term patency issue with the DES.  Apparently she was on 81 mg instead of 325 mg of aspirin after bypass graft.  Since her discharge,  she has been having intermittent chest pain almost on a daily basis with physical exertion.  Her blood pressure is borderline today, I will decrease her lisinopril to 10 mg daily and increase her Imdur to 60 mg daily.  She is now back on 325 mg daily of aspirin.  I will defer to cardiothoracic surgery to decide when she can switch back to 81 mg aspirin.  I have also given her sublingual nitroglycerin for prolonged cases of chest pain as well.  She does not have significant lower extremity edema, orthopnea or PND to suggest heart failure.  Her lung is clear on physical exam.  She can follow-up with Victoria Eaton in 30-month   Past Medical History:  Diagnosis Date  . Anxiety   . Arthritis    "maybe in my left foot" (10/14/2017)  . Bladder prolapse, female, acquired 09/14/2017  . Depression   . High cholesterol   . History of stomach ulcers   . Hypertension   . Myocardial infarction ([facility] 12/30/2004  . Type II diabetes mellitus ([facility]     Past Surgical History:  Procedure Laterality Date  . CARDIAC CATHETERIZATION  10/14/2017  . CORONARY ANGIOPLASTY WITH STENT PLACEMENT  12/30/2004   Patient reported  . CORONARY ARTERY BYPASS GRAFT N/A 10/19/2017   Procedure: CORONARY ARTERY BYPASS GRAFTING (CABG) times four using the right saphaneous vein. Harvested endoscopicly and left internal mammary artery.;  Surgeon: Victoria Eaton;  [facility]  Service: Open Heart Surgery;  Laterality: N/A;  .  DILATION AND CURETTAGE OF UTERUS  1980s  . LEFT HEART CATH AND CORONARY ANGIOGRAPHY N/A 10/14/2017   Procedure: LEFT HEART CATH AND CORONARY ANGIOGRAPHY;  Surgeon: Victoria Eaton;  Location: [facility];  Service: Cardiovascular;  Laterality: N/A;  . LEFT HEART CATH AND CORS/GRAFTS ANGIOGRAPHY N/A 01/01/2018   Procedure: LEFT HEART CATH AND CORS/GRAFTS ANGIOGRAPHY;  Surgeon: Victoria Eaton;  Location: [facility];  Service: Cardiovascular;  Laterality: N/A;  . PILONIDAL CYST  EXCISION  1980s  . TEE WITHOUT CARDIOVERSION N/A 10/19/2017   Procedure: TRANSESOPHAGEAL ECHOCARDIOGRAM (TEE);  Surgeon: Victoria Isaac, Eaton;  Location: [facility];  Service: Open Heart Surgery;  Laterality: N/A;    Current Medications: Outpatient Medications Prior to Visit  Medication Sig Dispense Refill  . amLODipine (NORVASC) 5 MG tablet Take 1 tablet (5 mg total) by mouth daily. 90 tablet 1  . aspirin EC 325 MG EC tablet Take 1 tablet (325 mg total) by mouth daily. 30 tablet 0  . atorvastatin (LIPITOR) 80 MG tablet Take 1 tablet (80 mg total) by mouth daily at 6 PM. 90 tablet 1  . buPROPion (WELLBUTRIN XL) 150 MG 24 hr tablet TAKE ONE TABLET BY MOUTH ONE TIME DAILY 90 tablet 0  . EPINEPHrine (EPIPEN 2-PAK) 0.3 mg/0.3 mL IJ SOAJ injection Inject 0.3 mLs (0.3 mg total) into the muscle once as needed. (Patient taking differently: Inject 0.3 mg into the muscle once as needed (for an allergic reaction). ) 1 Device 1  . FARXIGA 10 MG TABS tablet TAKE ONE TABLET BY MOUTH ONE TIME DAILY 90 tablet 0  . HYDROcodone-acetaminophen (NORCO) 5-325 MG tablet Take 1 tablet by mouth every 6 (six) hours as needed for moderate pain. 12 tablet 0  . Insulin Glargine (LANTUS SOLOSTAR) 100 UNIT/ML Solostar Pen Inject 10 Units into the skin daily at 10 pm. Increase dose by 2u every 3 days until fasting a.m. cbgs <150 5 pen 0  . Insulin Pen Needle (PEN NEEDLES) 32G X 4 MM MISC 1 Units by Does not apply route daily. 100 each 11  . loratadine (CLARITIN) 10 MG tablet Take 1 tablet (10 mg total) by mouth daily. (Patient taking differently: Take 10 mg by mouth daily as needed for allergies. ) 90 tablet 3  . metoprolol tartrate (LOPRESSOR) 25 MG tablet Take 1 tablet (25 mg total) by mouth 2 (two) times daily. 180 tablet 1  . Multiple Vitamin (MULTIVITAMIN WITH MINERALS) TABS tablet Take 1 tablet by mouth daily.    . Omega-3 Fatty Acids (FISH OIL CONCENTRATE) 300 MG CAPS Take 1 capsule by mouth daily.    [name] oxyCODONE (OXY  IR/ROXICODONE) 5 MG immediate release tablet Take 1 tablet (5 mg total) by mouth every 8 (eight) hours as needed for severe pain. 15 tablet 0  . sertraline (ZOLOFT) 50 MG tablet TAKE ONE TABLET BY MOUTH ONE TIME DAILY 30 tablet 3  . TURMERIC PO Take 1 tablet by mouth daily.    . isosorbide mononitrate (IMDUR) 30 MG 24 hr tablet Take 1 tablet (30 mg total) by mouth daily. 90 tablet 3  . lisinopril (PRINIVIL,ZESTRIL) 20 MG tablet Take 1 tablet (20 mg total) by mouth daily. 90 tablet 1   No facility-administered medications prior to visit.      Allergies:   Januvia [sitagliptin]; Kiwi extract; Shrimp [shellfish allergy]; Metformin and related; and Penicillins   Social History   Socioeconomic History  . Marital status: Married    Spouse name:  Carl  . Number of children: 1  . Years of education: Not on file  . Highest education level: Not on file  Occupational History  . Not on file  Social Needs  . Financial resource strain: Not on file  . Food insecurity:    Worry: Not on file    Inability: Not on file  . Transportation needs:    Medical: Not on file    Non-medical: Not on file  Tobacco Use  . Smoking status: Former Smoker    Packs/day: 0.12    Years: 45.00    Pack years: 5.40    Types: Cigarettes    Start date: 05/01/1972    Last attempt to quit: 09/30/2017    Years since quitting: 0.3  . Smokeless tobacco: Never Used  Substance and Sexual Activity  . Alcohol use: Yes    Alcohol/week: 8.4 oz    Types: 14 Glasses of wine per week  . Drug use: No  . Sexual activity: Not Currently  Lifestyle  . Physical activity:    Days per week: Not on file    Minutes per session: Not on file  . Stress: Not on file  Relationships  . Social connections:    Talks on phone: Not on file    Gets together: Not on file    Attends religious service: Not on file    Active member of club or organization: Not on file    Attends meetings of clubs or organizations: Not on file    Relationship  status: Not on file  Other Topics Concern  . Not on file  Social History Narrative  . Not on file     Family History:  The patient's family history includes Heart disease in her father; Hyperlipidemia in her father; Hypertension in her father.   ROS:   Please see the history of present illness.    ROS All other systems reviewed and are negative.   PHYSICAL EXAM:   VS:  BP 102/60 (BP Location: Left Arm)   Pulse 81   Ht 5' 6"  (1.676 m)   Wt 164 lb 9.6 oz (74.7 kg)   BMI 26.57 kg/m    GEN: Well nourished, well developed, in no acute distress  HEENT: normal  Neck: no JVD, carotid bruits, or masses Cardiac: RRR; no murmurs, rubs, or gallops,no edema  Respiratory:  clear to auscultation bilaterally, normal work of breathing GI: soft, nontender, nondistended, + BS MS: no deformity or atrophy  Skin: warm and dry, no rash Neuro:  Alert and Oriented x 3, Strength and sensation are intact Psych: euthymic mood, full affect  Wt Readings from Last 3 Encounters:  01/19/18 164 lb 9.6 oz (74.7 kg)  01/07/18 158 lb 12.8 oz (72 kg)  01/02/18 164 lb 0.4 oz (74.4 kg)      Studies/Labs Reviewed:   EKG:  EKG is not ordered today.    Recent Labs: 10/20/2017: Magnesium 2.0 12/07/2017: TSH 1.780 12/16/2017: ALT 20; B Natriuretic Peptide 87.9 01/02/2018: BUN 14; Creatinine, Ser 0.84; Hemoglobin 10.9; Platelets 201; Potassium 3.7; Sodium 136   Lipid Panel    Component Value Date/Time   CHOL 137 10/10/2017 1848   CHOL 175 05/01/2017 0939   TRIG 144 10/10/2017 1848   HDL 38 (L) 10/10/2017 1848   HDL 53 05/01/2017 0939   CHOLHDL 3.6 10/10/2017 1848   VLDL 29 10/10/2017 1848   LDLCALC 70 10/10/2017 1848   LDLCALC 86 05/01/2017 0939    Additional studies/ records that  were reviewed today include:   Cath 01/01/2018 Conclusion     Prox LAD to Mid LAD lesion is 90% stenosed. LIMA to LAD is patent.  Distal LAD to Dist LAD lesion is 85% stenosed, past the insertion of the LIMA.  Ost  1st Diag lesion is 80% stenosed. 1st Diag lesion is 90% stenosed. Lat 1st Diag lesion is 90% stenosed. SVG to diagonal is occluded.  Ost 1st Mrg to 1st Mrg lesion is 100% stenosed. SVG to OM is occluded.  Prox RCA lesion is 80% stenosed. Mid RCA to Dist RCA lesion is 70% stenosed. Dist RCA lesion is 70% stenosed. SVG to PDA is widely patent and provides collaterals to the occluded OM.  The left ventricular systolic function is normal.  LV end diastolic pressure is normal.  The left ventricular ejection fraction is 55-65% by visual estimate.  There is no aortic valve stenosis.   Continue medical therapy.  The grafts to LAD and RCA are patent.    The SVGs to the OM and diagonal are occluded.  Both of these vessels were small in caliber.   Could consider CTO PCI attempt of the OM if she has refractory angina.  However, given how small in caliber the OM vessel is, there may be long term patency issues of a DES.       ASSESSMENT:    1. Chest pain, unspecified type   2. Coronary artery disease of bypass graft of native heart with stable angina pectoris ([facility])   3. Essential hypertension   4. Controlled type 2 diabetes mellitus without complication, without long-term current use of insulin ([facility])   5. Hyperlipidemia, unspecified hyperlipidemia type      PLAN:  In order of problems listed above:  1. Chest pain: Recently underwent relook cardiac catheterization, unfortunately 2 out of 4 bypass graft has already went down.  We will continue to uptitrate antianginal medication, decrease lisinopril to 10 mg daily and  increase Imdur to 60 mg daily.  Nitroglycerin on a as needed basis.  2. CAD s/p CABG: Started on 325 mg daily of aspirin after recent hospitalization.  Continue high-dose statin  3. Hypertension: Blood pressure stable, will decrease lisinopril in order to increase Imdur to 60 mg daily  4. Hyperlipidemia: On Lipitor 80 mg daily, cholesterol is actually quite well controlled  based on lab work in January 2019.  5. DM2: Managed by primary care provider.    Medication Adjustments/Labs and Tests Ordered: Current medicines are reviewed at length with the patient today.  Concerns regarding medicines are outlined above.  Medication changes, Labs and Tests ordered today are listed in the Patient Instructions below. Patient Instructions  Medication Instructions:  Increase: Imdur 60 mg daily Decrease: Lisinopril 10 mg daily Start: Nitroglycerin 0.4 mg -Place 1 tablet under the tongue every 5 minutes as needed for chest pain for a total of 3 tablets (15 mins). Call 911 right before you take the 3rd tablet.  Labwork: None  Testing/Procedures: None  Follow-Up: Your physician recommends that you schedule a follow-up appointment in: 2 months with Victoria Eaton   Any Other Special Instructions Will Be Listed Below (If Applicable).     If you need a refill on your cardiac medications before your next appointment, please call your pharmacy.      Hilbert Corrigan, [place]  01/21/2018 11:06 PM    [address] [place], [place], [place]  [address] Phone: [phone]; Fax: [phone]

## 2018-01-19 NOTE — Patient Instructions (Signed)
Medication Instructions:  Increase: Imdur 60 mg daily Decrease: Lisinopril 10 mg daily Start: Nitroglycerin 0.4 mg -Place 1 tablet under the tongue every 5 minutes as needed for chest pain for a total of 3 tablets (15 mins). Call 911 right before you take the 3rd tablet.  Labwork: None  Testing/Procedures: None  Follow-Up: Your physician recommends that you schedule a follow-up appointment in: 2 months with Dr. Debara Pickett   Any Other Special Instructions Will Be Listed Below (If Applicable).     If you need a refill on your cardiac medications before your next appointment, please call your pharmacy.

## 2018-01-21 ENCOUNTER — Encounter: Payer: Self-pay | Admitting: Physician Assistant

## 2018-01-25 ENCOUNTER — Telehealth: Payer: Self-pay

## 2018-01-25 NOTE — Telephone Encounter (Signed)
Patient called and asked about Cardiac Rehab, she stated that she was told she needed clearance before attending.  Per Dr. Servando Snare, patient is to call Dr. Debara Pickett her Cardiologist about clearance.  Left a Voicemail on patient's phone and gave callback if there was any questions.  Also, ASA dosage is to be addressed by Cardiology as well, per Dr. Servando Snare.  Will notify patient when called back.

## 2018-01-25 NOTE — Telephone Encounter (Signed)
Patient contacted and acknowledged receipt.

## 2018-01-25 NOTE — Telephone Encounter (Signed)
-----   Message from Grace Isaac, MD sent at 01/25/2018  7:22 AM EDT ----- Sure  ----- Message ----- From: Donnella Sham, RN Sent: 01/21/2018  10:02 AM To: Grace Isaac, MD  Patient is asking for her PCP if it is ok for her to have cortisone injections in her shoulder? She is post-op CABG x4 10/19/2017.

## 2018-01-25 NOTE — Telephone Encounter (Signed)
This encounter was created in error - please disregard.

## 2018-01-25 NOTE — Telephone Encounter (Signed)
-----   Message from Grace Isaac, MD sent at 01/25/2018  1:56 PM EDT ----- Clearance for cardiac rehab , should come from Dr Debara Pickett after recent admission. Cardiology PA also put in note asa dose per surgery, asa dose is up to Dr Geralyn Corwin  Also.   ----- Message ----- From: Donnella Sham, RN Sent: 01/25/2018  10:26 AM To: Grace Isaac, MD  So sorry, every time I call her, she has another question.    She is saying she needs clearance for Cardiac Rehabilitation.  She was seen by PA 11/23/2017, sx was 10/19/2017.  However she was back in the hospital where she had a cath 01/01/2018 and told she has vein graft occlusions. She is to see you for f/u in June.  Can she be referred to Rehab?  Or do you need to see her first?  Thanks,  Caryl Pina

## 2018-01-26 ENCOUNTER — Ambulatory Visit: Payer: BLUE CROSS/BLUE SHIELD

## 2018-02-04 ENCOUNTER — Ambulatory Visit: Payer: BLUE CROSS/BLUE SHIELD | Admitting: Family Medicine

## 2018-02-05 ENCOUNTER — Ambulatory Visit: Payer: BLUE CROSS/BLUE SHIELD | Admitting: Family Medicine

## 2018-02-07 ENCOUNTER — Inpatient Hospital Stay (HOSPITAL_COMMUNITY)
Admission: EM | Admit: 2018-02-07 | Discharge: 2018-02-10 | DRG: 303 | Disposition: A | Discharge status: Home / Self Care | Payer: BLUE CROSS/BLUE SHIELD | Attending: Cardiovascular Disease | Admitting: Cardiovascular Disease

## 2018-02-07 DIAGNOSIS — Z91018 Allergy to other foods: Secondary | ICD-10-CM | POA: Diagnosis not present

## 2018-02-07 DIAGNOSIS — I25700 Atherosclerosis of coronary artery bypass graft(s), unspecified, with unstable angina pectoris: Secondary | ICD-10-CM | POA: Diagnosis not present

## 2018-02-07 DIAGNOSIS — Z8711 Personal history of peptic ulcer disease: Secondary | ICD-10-CM

## 2018-02-07 DIAGNOSIS — Z8249 Family history of ischemic heart disease and other diseases of the circulatory system: Secondary | ICD-10-CM | POA: Diagnosis not present

## 2018-02-07 DIAGNOSIS — D72829 Elevated white blood cell count, unspecified: Secondary | ICD-10-CM | POA: Diagnosis not present

## 2018-02-07 DIAGNOSIS — I214 Non-ST elevation (NSTEMI) myocardial infarction: Secondary | ICD-10-CM | POA: Diagnosis not present

## 2018-02-07 DIAGNOSIS — Z7982 Long term (current) use of aspirin: Secondary | ICD-10-CM

## 2018-02-07 DIAGNOSIS — E119 Type 2 diabetes mellitus without complications: Secondary | ICD-10-CM | POA: Diagnosis not present

## 2018-02-07 DIAGNOSIS — E785 Hyperlipidemia, unspecified: Secondary | ICD-10-CM | POA: Diagnosis not present

## 2018-02-07 DIAGNOSIS — Z951 Presence of aortocoronary bypass graft: Secondary | ICD-10-CM

## 2018-02-07 DIAGNOSIS — R079 Chest pain, unspecified: Secondary | ICD-10-CM | POA: Diagnosis not present

## 2018-02-07 DIAGNOSIS — Z79899 Other long term (current) drug therapy: Secondary | ICD-10-CM

## 2018-02-07 DIAGNOSIS — I252 Old myocardial infarction: Secondary | ICD-10-CM | POA: Diagnosis not present

## 2018-02-07 DIAGNOSIS — F329 Major depressive disorder, single episode, unspecified: Secondary | ICD-10-CM | POA: Diagnosis not present

## 2018-02-07 DIAGNOSIS — Z955 Presence of coronary angioplasty implant and graft: Secondary | ICD-10-CM

## 2018-02-07 DIAGNOSIS — I10 Essential (primary) hypertension: Secondary | ICD-10-CM | POA: Diagnosis not present

## 2018-02-07 DIAGNOSIS — M1990 Unspecified osteoarthritis, unspecified site: Secondary | ICD-10-CM | POA: Diagnosis present

## 2018-02-07 DIAGNOSIS — Z888 Allergy status to other drugs, medicaments and biological substances status: Secondary | ICD-10-CM | POA: Diagnosis not present

## 2018-02-07 DIAGNOSIS — I208 Other forms of angina pectoris: Secondary | ICD-10-CM | POA: Diagnosis not present

## 2018-02-07 DIAGNOSIS — I25110 Atherosclerotic heart disease of native coronary artery with unstable angina pectoris: Secondary | ICD-10-CM | POA: Diagnosis not present

## 2018-02-07 DIAGNOSIS — I200 Unstable angina: Secondary | ICD-10-CM | POA: Diagnosis present

## 2018-02-07 DIAGNOSIS — Z91013 Allergy to seafood: Secondary | ICD-10-CM | POA: Diagnosis not present

## 2018-02-07 DIAGNOSIS — Z8349 Family history of other endocrine, nutritional and metabolic diseases: Secondary | ICD-10-CM | POA: Diagnosis not present

## 2018-02-07 DIAGNOSIS — Z87891 Personal history of nicotine dependence: Secondary | ICD-10-CM

## 2018-02-07 DIAGNOSIS — Z794 Long term (current) use of insulin: Secondary | ICD-10-CM

## 2018-02-07 DIAGNOSIS — Z720 Tobacco use: Secondary | ICD-10-CM | POA: Diagnosis present

## 2018-02-07 DIAGNOSIS — Z880 Allergy status to penicillin: Secondary | ICD-10-CM | POA: Diagnosis not present

## 2018-02-08 ENCOUNTER — Other Ambulatory Visit: Payer: Self-pay

## 2018-02-08 ENCOUNTER — Emergency Department (HOSPITAL_COMMUNITY): Payer: BLUE CROSS/BLUE SHIELD

## 2018-02-08 ENCOUNTER — Encounter (HOSPITAL_COMMUNITY): Payer: Self-pay

## 2018-02-08 DIAGNOSIS — E119 Type 2 diabetes mellitus without complications: Secondary | ICD-10-CM | POA: Diagnosis not present

## 2018-02-08 DIAGNOSIS — Z880 Allergy status to penicillin: Secondary | ICD-10-CM | POA: Diagnosis not present

## 2018-02-08 DIAGNOSIS — Z8349 Family history of other endocrine, nutritional and metabolic diseases: Secondary | ICD-10-CM | POA: Diagnosis not present

## 2018-02-08 DIAGNOSIS — Z955 Presence of coronary angioplasty implant and graft: Secondary | ICD-10-CM | POA: Diagnosis not present

## 2018-02-08 DIAGNOSIS — Z91013 Allergy to seafood: Secondary | ICD-10-CM | POA: Diagnosis not present

## 2018-02-08 DIAGNOSIS — Z888 Allergy status to other drugs, medicaments and biological substances status: Secondary | ICD-10-CM | POA: Diagnosis not present

## 2018-02-08 DIAGNOSIS — Z87891 Personal history of nicotine dependence: Secondary | ICD-10-CM | POA: Diagnosis not present

## 2018-02-08 DIAGNOSIS — R079 Chest pain, unspecified: Secondary | ICD-10-CM | POA: Diagnosis not present

## 2018-02-08 DIAGNOSIS — Z8249 Family history of ischemic heart disease and other diseases of the circulatory system: Secondary | ICD-10-CM | POA: Diagnosis not present

## 2018-02-08 DIAGNOSIS — I25110 Atherosclerotic heart disease of native coronary artery with unstable angina pectoris: Secondary | ICD-10-CM | POA: Diagnosis not present

## 2018-02-08 DIAGNOSIS — I10 Essential (primary) hypertension: Secondary | ICD-10-CM | POA: Diagnosis not present

## 2018-02-08 DIAGNOSIS — Z91018 Allergy to other foods: Secondary | ICD-10-CM | POA: Diagnosis not present

## 2018-02-08 DIAGNOSIS — Z951 Presence of aortocoronary bypass graft: Secondary | ICD-10-CM | POA: Diagnosis not present

## 2018-02-08 DIAGNOSIS — F329 Major depressive disorder, single episode, unspecified: Secondary | ICD-10-CM | POA: Diagnosis present

## 2018-02-08 DIAGNOSIS — M1990 Unspecified osteoarthritis, unspecified site: Secondary | ICD-10-CM | POA: Diagnosis present

## 2018-02-08 DIAGNOSIS — I200 Unstable angina: Secondary | ICD-10-CM | POA: Diagnosis not present

## 2018-02-08 DIAGNOSIS — I214 Non-ST elevation (NSTEMI) myocardial infarction: Secondary | ICD-10-CM | POA: Diagnosis not present

## 2018-02-08 DIAGNOSIS — E785 Hyperlipidemia, unspecified: Secondary | ICD-10-CM | POA: Diagnosis present

## 2018-02-08 DIAGNOSIS — D72829 Elevated white blood cell count, unspecified: Secondary | ICD-10-CM | POA: Diagnosis present

## 2018-02-08 DIAGNOSIS — I252 Old myocardial infarction: Secondary | ICD-10-CM | POA: Diagnosis not present

## 2018-02-08 DIAGNOSIS — Z8711 Personal history of peptic ulcer disease: Secondary | ICD-10-CM | POA: Diagnosis not present

## 2018-02-08 DIAGNOSIS — I25700 Atherosclerosis of coronary artery bypass graft(s), unspecified, with unstable angina pectoris: Secondary | ICD-10-CM | POA: Diagnosis not present

## 2018-02-08 DIAGNOSIS — Z7982 Long term (current) use of aspirin: Secondary | ICD-10-CM | POA: Diagnosis not present

## 2018-02-08 DIAGNOSIS — Z794 Long term (current) use of insulin: Secondary | ICD-10-CM | POA: Diagnosis not present

## 2018-02-08 DIAGNOSIS — Z79899 Other long term (current) drug therapy: Secondary | ICD-10-CM | POA: Diagnosis not present

## 2018-02-08 LAB — URINALYSIS, ROUTINE W REFLEX MICROSCOPIC
Bilirubin Urine: NEGATIVE
Glucose, UA: 150 mg/dL — AB
Hgb urine dipstick: NEGATIVE
KETONES UR: NEGATIVE mg/dL
LEUKOCYTES UA: NEGATIVE
NITRITE: NEGATIVE
PH: 5 (ref 5.0–8.0)
Protein, ur: NEGATIVE mg/dL
Specific Gravity, Urine: 1.014 (ref 1.005–1.030)

## 2018-02-08 LAB — CBC
HEMATOCRIT: 34.2 % — AB (ref 36.0–46.0)
HEMOGLOBIN: 10.9 g/dL — AB (ref 12.0–15.0)
MCH: 23.9 pg — ABNORMAL LOW (ref 26.0–34.0)
MCHC: 31.9 g/dL (ref 30.0–36.0)
MCV: 75 fL — ABNORMAL LOW (ref 78.0–100.0)
Platelets: 217 10*3/uL (ref 150–400)
RBC: 4.56 MIL/uL (ref 3.87–5.11)
RDW: 18.4 % — ABNORMAL HIGH (ref 11.5–15.5)
WBC: 11.2 10*3/uL — AB (ref 4.0–10.5)

## 2018-02-08 LAB — BASIC METABOLIC PANEL
ANION GAP: 7 (ref 5–15)
Anion gap: 8 (ref 5–15)
BUN: 22 mg/dL — ABNORMAL HIGH (ref 6–20)
BUN: 26 mg/dL — ABNORMAL HIGH (ref 6–20)
CHLORIDE: 107 mmol/L (ref 101–111)
CO2: 22 mmol/L (ref 22–32)
CO2: 24 mmol/L (ref 22–32)
CREATININE: 0.96 mg/dL (ref 0.44–1.00)
Calcium: 9.1 mg/dL (ref 8.9–10.3)
Calcium: 9.2 mg/dL (ref 8.9–10.3)
Chloride: 109 mmol/L (ref 101–111)
Creatinine, Ser: 1.16 mg/dL — ABNORMAL HIGH (ref 0.44–1.00)
GFR calc Af Amer: >60 mL/min (ref >60)
GFR calc non Af Amer: >60 mL/min (ref >60)
GFR, EST AFRICAN AMERICAN: 59 mL/min — AB (ref >60)
GFR, EST NON AFRICAN AMERICAN: 50 mL/min — AB (ref >60)
GLUCOSE: 179 mg/dL — AB (ref 65–99)
Glucose, Bld: 226 mg/dL — ABNORMAL HIGH (ref 65–99)
POTASSIUM: 3.8 mmol/L (ref 3.5–5.1)
Potassium: 3.9 mmol/L (ref 3.5–5.1)
SODIUM: 138 mmol/L (ref 135–145)
Sodium: 139 mmol/L (ref 135–145)

## 2018-02-08 LAB — HEPARIN LEVEL (UNFRACTIONATED)
Heparin Unfractionated: <0.1 IU/mL — ABNORMAL LOW (ref 0.30–0.70)
Heparin Unfractionated: 0.36 IU/mL (ref 0.30–0.70)

## 2018-02-08 LAB — GLUCOSE, CAPILLARY
GLUCOSE-CAPILLARY: 108 mg/dL — AB (ref 65–99)
GLUCOSE-CAPILLARY: 189 mg/dL — AB (ref 65–99)
Glucose-Capillary: 137 mg/dL — ABNORMAL HIGH (ref 65–99)

## 2018-02-08 LAB — CBG MONITORING, ED
Glucose-Capillary: 206 mg/dL — ABNORMAL HIGH (ref 65–99)
Glucose-Capillary: 169 mg/dL — ABNORMAL HIGH (ref 65–99)

## 2018-02-08 LAB — MRSA PCR SCREENING: MRSA by PCR: NEGATIVE

## 2018-02-08 LAB — I-STAT TROPONIN, ED: Troponin i, poc: 0 ng/mL (ref 0.00–0.08)

## 2018-02-08 LAB — I-STAT BETA HCG BLOOD, ED (MC, WL, AP ONLY): HCG, QUANTITATIVE: 9.1 m[IU]/mL — AB (ref <5)

## 2018-02-08 LAB — TROPONIN I
TROPONIN I: 3.2 ng/mL — AB (ref <0.03)
TROPONIN I: 2.71 ng/mL — AB (ref <0.03)
Troponin I: 1.98 ng/mL (ref <0.03)

## 2018-02-08 LAB — PROTIME-INR
INR: 0.9
Prothrombin Time: 12.1 seconds (ref 11.4–15.2)

## 2018-02-08 LAB — APTT: APTT: 31 s (ref 24–36)

## 2018-02-08 MED ORDER — HYDROCODONE-ACETAMINOPHEN 5-325 MG PO TABS: 1.0000 | ORAL_TABLET | Freq: Four times a day (QID) | ORAL | Status: DC | PRN

## 2018-02-08 MED ORDER — HEPARIN BOLUS VIA INFUSION
1500.0000 [IU] | Freq: Once | INTRAVENOUS | Status: AC
  Administered 2018-02-08: 1500 [IU] via INTRAVENOUS
  Filled 2018-02-08: qty 1500

## 2018-02-08 MED ORDER — METOPROLOL TARTRATE 25 MG PO TABS
25.0000 mg | ORAL_TABLET | Freq: Four times a day (QID) | ORAL | Status: DC
  Administered 2018-02-08 – 2018-02-10 (×8): 25 mg via ORAL
  Filled 2018-02-08 (×9): qty 1

## 2018-02-08 MED ORDER — SERTRALINE HCL 50 MG PO TABS
50.0000 mg | ORAL_TABLET | Freq: Every day | ORAL | Status: DC
  Administered 2018-02-08 – 2018-02-10 (×3): 50 mg via ORAL
  Filled 2018-02-08 (×4): qty 1

## 2018-02-08 MED ORDER — LISINOPRIL 10 MG PO TABS: 10.0000 mg | ORAL_TABLET | Freq: Every day | ORAL | Status: DC

## 2018-02-08 MED ORDER — CANAGLIFLOZIN 100 MG PO TABS
100.0000 mg | ORAL_TABLET | Freq: Every day | ORAL | Status: DC
  Filled 2018-02-08 (×3): qty 1

## 2018-02-08 MED ORDER — NITROGLYCERIN 0.4 MG SL SUBL: 0.4000 mg | SUBLINGUAL_TABLET | SUBLINGUAL | Status: DC | PRN

## 2018-02-08 MED ORDER — HEPARIN BOLUS VIA INFUSION
3500.0000 [IU] | Freq: Once | INTRAVENOUS | Status: AC
  Administered 2018-02-08: 3500 [IU] via INTRAVENOUS
  Filled 2018-02-08: qty 3500

## 2018-02-08 MED ORDER — HEPARIN (PORCINE) IN NACL 100-0.45 UNIT/ML-% IJ SOLN
1250.0000 [IU]/h | INTRAMUSCULAR | Status: DC
  Administered 2018-02-08: 1000 [IU]/h via INTRAVENOUS
  Filled 2018-02-08 (×4): qty 250

## 2018-02-08 MED ORDER — AMLODIPINE BESYLATE 5 MG PO TABS: 5.0000 mg | ORAL_TABLET | Freq: Every day | ORAL | Status: DC

## 2018-02-08 MED ORDER — ONDANSETRON HCL 4 MG/2ML IJ SOLN: 4.0000 mg | Freq: Four times a day (QID) | INTRAMUSCULAR | Status: DC | PRN

## 2018-02-08 MED ORDER — ISOSORBIDE DINITRATE 30 MG PO TABS: 30.0000 mg | ORAL_TABLET | Freq: Three times a day (TID) | ORAL | Status: DC

## 2018-02-08 MED ORDER — INSULIN ASPART 100 UNIT/ML ~~LOC~~ SOLN
0.0000 [IU] | Freq: Three times a day (TID) | SUBCUTANEOUS | Status: DC
  Administered 2018-02-08: 2 [IU] via SUBCUTANEOUS
  Administered 2018-02-08: 3 [IU] via SUBCUTANEOUS
  Administered 2018-02-09: 2 [IU] via SUBCUTANEOUS
  Administered 2018-02-09 – 2018-02-10 (×2): 5 [IU] via SUBCUTANEOUS
  Filled 2018-02-08: qty 1

## 2018-02-08 MED ORDER — ATORVASTATIN CALCIUM 80 MG PO TABS
80.0000 mg | ORAL_TABLET | Freq: Every day | ORAL | Status: DC
  Administered 2018-02-08 – 2018-02-09 (×2): 80 mg via ORAL
  Filled 2018-02-08 (×2): qty 1

## 2018-02-08 MED ORDER — INSULIN ASPART 100 UNIT/ML ~~LOC~~ SOLN
0.0000 [IU] | Freq: Every day | SUBCUTANEOUS | Status: DC
  Administered 2018-02-09: 3 [IU] via SUBCUTANEOUS

## 2018-02-08 MED ORDER — ASPIRIN EC 81 MG PO TBEC
81.0000 mg | DELAYED_RELEASE_TABLET | Freq: Every day | ORAL | Status: DC
  Administered 2018-02-08 – 2018-02-10 (×3): 81 mg via ORAL
  Filled 2018-02-08 (×3): qty 1

## 2018-02-08 MED ORDER — BUPROPION HCL ER (XL) 150 MG PO TB24
150.0000 mg | ORAL_TABLET | Freq: Every day | ORAL | Status: DC
  Administered 2018-02-08 – 2018-02-10 (×3): 150 mg via ORAL
  Filled 2018-02-08 (×3): qty 1

## 2018-02-08 MED ORDER — ISOSORBIDE MONONITRATE ER 30 MG PO TB24
90.0000 mg | ORAL_TABLET | Freq: Every day | ORAL | Status: DC
  Administered 2018-02-08 – 2018-02-10 (×3): 90 mg via ORAL
  Filled 2018-02-08 (×3): qty 1

## 2018-02-08 MED ORDER — ADULT MULTIVITAMIN W/MINERALS CH
1.0000 | ORAL_TABLET | Freq: Every day | ORAL | Status: DC
  Administered 2018-02-08 – 2018-02-10 (×3): 1 via ORAL
  Filled 2018-02-08 (×3): qty 1

## 2018-02-08 MED ORDER — INSULIN GLARGINE 100 UNIT/ML ~~LOC~~ SOLN
30.0000 [IU] | Freq: Every day | SUBCUTANEOUS | Status: DC
Start: 2018-02-08 — End: 2018-02-10
  Administered 2018-02-08 – 2018-02-09 (×2): 30 [IU] via SUBCUTANEOUS
  Filled 2018-02-08 (×3): qty 0.3

## 2018-02-08 MED ORDER — FENTANYL CITRATE (PF) 100 MCG/2ML IJ SOLN
50.0000 ug | Freq: Once | INTRAMUSCULAR | Status: AC
  Administered 2018-02-08: 50 ug via INTRAVENOUS
  Filled 2018-02-08: qty 2

## 2018-02-08 MED ORDER — LISINOPRIL 5 MG PO TABS
5.0000 mg | ORAL_TABLET | Freq: Every day | ORAL | Status: DC
  Administered 2018-02-08 – 2018-02-10 (×3): 5 mg via ORAL
  Filled 2018-02-08 (×3): qty 1

## 2018-02-08 MED ORDER — INSULIN GLARGINE 100 UNIT/ML SOLOSTAR PEN: 30.0000 [IU] | PEN_INJECTOR | Freq: Every day | SUBCUTANEOUS | Status: DC

## 2018-02-08 MED ORDER — AMLODIPINE BESYLATE 10 MG PO TABS
10.0000 mg | ORAL_TABLET | Freq: Every day | ORAL | Status: DC
  Administered 2018-02-08 – 2018-02-10 (×3): 10 mg via ORAL
  Filled 2018-02-08 (×3): qty 1

## 2018-02-08 NOTE — ED Triage Notes (Signed)
Pt. Arrived by EMS  From home with reports of chest pain 8/10. Pt took 650 of aspirin and 4 nitro prior to EMS. Pt. Reports left chest pain with radiation to arm and neck. Pt. Had a stent placed in 2006 and Quad bypass in January. A/O X4.

## 2018-02-08 NOTE — Progress Notes (Signed)
ANTICOAGULATION CONSULT NOTE - Initial Consult  Pharmacy Consult for heparin Indication: chest pain/ACS  Allergies  Allergen Reactions  . Januvia [Sitagliptin] Nausea And Vomiting and Other (See Comments)    Pancreatitis (this was in Epic, but patient was unaware of this)   . Kiwi Extract Hives  . Shrimp [Shellfish Allergy] Hives  . Metformin And Related Diarrhea  . Penicillins Other (See Comments)    From childhood: Has patient had a PCN reaction causing immediate rash, facial/tongue/throat swelling, SOB or lightheadedness with hypotension: Unk Has patient had a PCN reaction causing severe rash involving mucus membranes or skin necrosis: Unk Has patient had a PCN reaction that required hospitalization: Unk Has patient had a PCN reaction occurring within the last 10 years: No If all of the above answers are "NO", then may proceed with Cephalosporin use.     Patient Measurements: Height: 5' 6"  (167.6 cm) Weight: 164 lb (74.4 kg) IBW/kg (Calculated) : 59.3 Heparin Dosing Weight: 74.2 kg  Vital Signs: Temp: 98.3 F (36.8 C) (05/20 0008) Temp Source: Oral (05/20 0008) BP: 155/70 (05/20 0130) Pulse Rate: 81 (05/20 0130)  Labs: Recent Labs    02/08/18 0003 02/08/18 0027  HGB 10.9*  --   HCT 34.2*  --   PLT 217  --   APTT  --  31  LABPROT  --  12.1  INR  --  0.90  CREATININE 1.16*  --     Estimated Creatinine Clearance: 53.8 mL/min (A) (by C-G formula based on SCr of 1.16 mg/dL (H)).   Medical History: Past Medical History:  Diagnosis Date  . Anxiety   . Arthritis    "maybe in my left foot" (10/14/2017)  . Bladder prolapse, female, acquired 09/14/2017  . Depression   . High cholesterol   . History of stomach ulcers   . Hypertension   . Myocardial infarction ([facility]) 12/30/2004  . Type II diabetes mellitus ([facility])     Medications:  See medication history  Assessment: 57 yo lady to start heparin for CP.  She was not on anticoagulation PTA Goal of Therapy:   Heparin level 0.3-0.7 units/ml Monitor platelets by anticoagulation protocol: Yes   Plan:  Heparin 3500 unit bolus and drip at 1000 units/hr Check heparin level 6 hours after start Daily HL and CBC while on heparin  Victoria Eaton 02/08/2018,1:43 AM

## 2018-02-08 NOTE — ED Provider Notes (Signed)
[facility] EMERGENCY DEPARTMENT Provider Note   CSN: [id number] Arrival date & time: 02/07/18  2359     History   Chief Complaint Chief Complaint - chest pain  HPI Victoria Eaton is a 57 y.o. female.  The history is provided by the patient.  Chest Pain   This is a new problem. The current episode started 3 to 5 hours ago. The problem occurs constantly. The problem has not changed since onset.The pain is associated with rest. The pain is present in the substernal region. The pain is moderate. The quality of the pain is described as pressure-like. The pain radiates to the left arm. Associated symptoms include headaches, nausea and shortness of breath. Pertinent negatives include no abdominal pain, no diaphoresis and no vomiting. She has tried nitroglycerin (asa) for the symptoms. The treatment provided no relief.  Her past medical history is significant for CAD.  Patient with history of CAD, previous MI, high cholesterol, hypertension presents with chest pain. She reports onset of pain while making dinner.  She reports taking aspirin and 4 doses of nitroglycerin with minimal relief. She reports of the past several weeks she has had episodes of chest pain, but usually it resolves with rest or 1 dose of nitroglycerin.   Past Medical History:  Diagnosis Date  . Anxiety   . Arthritis    "maybe in my left foot" (10/14/2017)  . Bladder prolapse, female, acquired 09/14/2017  . Depression   . High cholesterol   . History of stomach ulcers   . Hypertension   . Myocardial infarction ([place]) 12/30/2004  . Type II diabetes mellitus [facility])     Patient Active Problem List   Diagnosis Date Noted  . Chest pain 01/01/2018  . Unstable angina ([place]) 01/01/2018  . Arm contusion, left, initial encounter 11/25/2017  . Contusion of right knee 11/25/2017  . History of open heart surgery 10/19/2017  . Epigastric pain   . Coronary artery disease involving native coronary artery of  native heart with angina pectoris ([place])   . Pancreatitis 10/10/2017  . Hyperlipidemia LDL goal <70 09/14/2017  . Type 2 diabetes mellitus without complication, without long-term current use of insulin ([place]) 05/01/2017  . Essential hypertension 05/01/2017  . Anxiety and depression 05/01/2017  . Tobacco abuse 05/01/2017  . History of MI (myocardial infarction) 05/01/2017  . Allergic rhinitis 05/01/2017  . History of arthritis 05/01/2017  . Bladder prolapse, female, acquired 11/09/2013    Past Surgical History:  Procedure Laterality Date  . CARDIAC CATHETERIZATION  10/14/2017  . CORONARY ANGIOPLASTY WITH STENT PLACEMENT  12/30/2004   Patient reported  . CORONARY ARTERY BYPASS GRAFT N/A 10/19/2017   Procedure: CORONARY ARTERY BYPASS GRAFTING (CABG) times four using the right saphaneous vein. Harvested endoscopicly and left internal mammary artery.;  Surgeon: Grace Isaac, MD;  Location: [place];  Service: Open Heart Surgery;  Laterality: N/A;  . DILATION AND CURETTAGE OF UTERUS  1980s  . LEFT HEART CATH AND CORONARY ANGIOGRAPHY N/A 10/14/2017   Procedure: LEFT HEART CATH AND CORONARY ANGIOGRAPHY;  Surgeon: Martinique, Peter M, MD;  Location: [place] CV LAB;  Service: Cardiovascular;  Laterality: N/A;  . LEFT HEART CATH AND CORS/GRAFTS ANGIOGRAPHY N/A 01/01/2018   Procedure: LEFT HEART CATH AND CORS/GRAFTS ANGIOGRAPHY;  Surgeon: Jettie Booze, MD;  Location: [place] [facility];  Service: Cardiovascular;  Laterality: N/A;  . PILONIDAL CYST EXCISION  1980s  . TEE WITHOUT CARDIOVERSION N/A 10/19/2017   Procedure: TRANSESOPHAGEAL ECHOCARDIOGRAM (TEE);  Surgeon: Grace Isaac, MD;  Location: [place];  Service: Open Heart Surgery;  Laterality: N/A;     OB History   None      Home Medications    Prior to Admission medications   Medication Sig Start Date End Date Taking? Authorizing Provider  amLODipine (NORVASC) 5 MG tablet Take 1 tablet (5 mg total) by mouth daily. 12/07/17    Shawnee Knapp, MD  aspirin EC 325 MG EC tablet Take 1 tablet (325 mg total) by mouth daily. 01/02/18   Strader, Fransisco Hertz, PA-C  atorvastatin (LIPITOR) 80 MG tablet Take 1 tablet (80 mg total) by mouth daily at 6 PM. 12/07/17   Shawnee Knapp, MD  buPROPion (WELLBUTRIN XL) 150 MG 24 hr tablet TAKE ONE TABLET BY MOUTH ONE TIME DAILY 11/09/17   Tenna Delaine D, PA-C  EPINEPHrine (EPIPEN 2-PAK) 0.3 mg/0.3 mL IJ SOAJ injection Inject 0.3 mLs (0.3 mg total) into the muscle once as needed. Patient taking differently: Inject 0.3 mg into the muscle once as needed (for an allergic reaction).  05/20/17   Tenna Delaine D, PA-C  FARXIGA 10 MG TABS tablet TAKE ONE TABLET BY MOUTH ONE TIME DAILY 11/16/17   Tenna Delaine D, PA-C  HYDROcodone-acetaminophen (NORCO) 5-325 MG tablet Take 1 tablet by mouth every 6 (six) hours as needed for moderate pain. 11/25/17   Horald Pollen, MD  Insulin Glargine (LANTUS SOLOSTAR) 100 UNIT/ML Solostar Pen Inject 10 Units into the skin daily at 10 pm. Increase dose by 2u every 3 days until fasting a.m. cbgs <150 12/07/17   Shawnee Knapp, MD  Insulin Pen Needle (PEN NEEDLES) 32G X 4 MM MISC 1 Units by Does not apply route daily. 12/07/17   Shawnee Knapp, MD  isosorbide mononitrate (IMDUR) 60 MG 24 hr tablet Take 1 tablet (60 mg total) by mouth daily. 01/19/18   Almyra Deforest, PA  lisinopril (PRINIVIL,ZESTRIL) 10 MG tablet Take 1 tablet (10 mg total) by mouth daily. 01/19/18   Almyra Deforest, PA  loratadine (CLARITIN) 10 MG tablet Take 1 tablet (10 mg total) by mouth daily. Patient taking differently: Take 10 mg by mouth daily as needed for allergies.  05/01/17   Tenna Delaine D, PA-C  metoprolol tartrate (LOPRESSOR) 25 MG tablet Take 1 tablet (25 mg total) by mouth 2 (two) times daily. 10/26/17   Tenna Delaine D, PA-C  Multiple Vitamin (MULTIVITAMIN WITH MINERALS) TABS tablet Take 1 tablet by mouth daily.    [provider]  nitroGLYCERIN (NITROSTAT) 0.4 MG SL tablet Place 1  tablet (0.4 mg total) under the tongue every 5 (five) minutes as needed for chest pain. 01/19/18 04/19/18  Almyra Deforest, PA  Omega-3 Fatty Acids (FISH OIL CONCENTRATE) 300 MG CAPS Take 1 capsule by mouth daily.    [provider]  oxyCODONE (OXY IR/ROXICODONE) 5 MG immediate release tablet Take 1 tablet (5 mg total) by mouth every 8 (eight) hours as needed for severe pain. 11/23/17   Barrett, Erin R, PA-C  sertraline (ZOLOFT) 50 MG tablet TAKE ONE TABLET BY MOUTH ONE TIME DAILY 12/04/17   Shawnee Knapp, MD  TURMERIC PO Take 1 tablet by mouth daily.    [provider]    Family History Family History  Problem Relation Age of Onset  . Heart disease Father   . Hyperlipidemia Father   . Hypertension Father     Social History Social History   Tobacco Use  . Smoking status: Former Smoker  Packs/day: 0.12    Years: 45.00    Pack years: 5.40    Types: Cigarettes    Start date: 05/01/1972    Last attempt to quit: 09/30/2017    Years since quitting: 0.3  . Smokeless tobacco: Never Used  Substance Use Topics  . Alcohol use: Yes    Alcohol/week: 8.4 oz    Types: 14 Glasses of wine per week  . Drug use: No     Allergies   Januvia [sitagliptin]; Kiwi extract; Shrimp [shellfish allergy]; Metformin and related; and Penicillins   Review of Systems Review of Systems  Constitutional: Negative for diaphoresis.  Respiratory: Positive for shortness of breath.   Cardiovascular: Positive for chest pain. Negative for leg swelling.  Gastrointestinal: Positive for nausea. Negative for abdominal pain, blood in stool and vomiting.  Neurological: Positive for headaches.  All other systems reviewed and are negative.    Physical Exam Updated Vital Signs BP (!) 150/78   Pulse 83   Temp 98.3 F (36.8 C) (Oral)   Resp (!) 21   Ht 1.676 m (5' 6" )   Wt 74.4 kg (164 lb)   SpO2 96%   BMI 26.47 kg/m   Physical Exam CONSTITUTIONAL: Well developed/well nourished HEAD:  Normocephalic/atraumatic EYES: EOMI/PERRL ENMT: Mucous membranes moist NECK: supple no meningeal signs SPINE/BACK:entire spine nontender CV: S1/S2 noted, no murmurs/rubs/gallops noted LUNGS: Lungs are clear to auscultation bilaterally, no apparent distress ABDOMEN: soft, nontender, no rebound or guarding, bowel sounds noted throughout abdomen GU:no cva tenderness NEURO: Pt is awake/alert/appropriate, moves all extremitiesx4.  No facial droop.   EXTREMITIES: pulses normal/equalx4, full ROM, no calf tenderness no edema SKIN: warm, color normal PSYCH: no abnormalities of mood noted, alert and oriented to situation   ED Treatments / Results  Labs (all labs ordered are listed, but only abnormal results are displayed) Labs Reviewed  BASIC METABOLIC PANEL - Abnormal; Notable for the following components:      Result Value   Glucose, Bld 226 (*)    BUN 26 (*)    Creatinine, Ser 1.16 (*)    GFR calc non Af Amer 50 (*)    GFR calc Af Amer 59 (*)    All other components within normal limits  CBC - Abnormal; Notable for the following components:   WBC 11.2 (*)    Hemoglobin 10.9 (*)    HCT 34.2 (*)    MCV 75.0 (*)    MCH 23.9 (*)    RDW 18.4 (*)    All other components within normal limits  I-STAT BETA HCG BLOOD, ED ([facility], [facility], AP ONLY) - Abnormal; Notable for the following components:   I-stat hCG, quantitative 9.1 (*)    All other components within normal limits  PROTIME-INR  APTT  I-STAT TROPONIN, ED    EKG EKG Interpretation  Date/Time:  Monday Feb 08 2018 00:08:03 EDT Ventricular Rate:  84 PR Interval:    QRS Duration: 101 QT Interval:  382 QTC Calculation: 452 R Axis:   18 Text Interpretation:  Sinus rhythm Probable left atrial enlargement LVH with secondary repolarization abnormality Anterior infarct, old Confirmed by Ripley Fraise [phone]) on 02/08/2018 12:25:32 AM   Radiology Dg Chest 2 View  Result Date: 02/08/2018 CLINICAL DATA:  Mid chest pain. EXAM: CHEST - 2  VIEW COMPARISON:  01/01/2018 FINDINGS: Post median sternotomy and CABG. Stable mild cardiomegaly and mediastinal contours. The lungs are clear. Pulmonary vasculature is normal. No consolidation, pleural effusion, or pneumothorax. No acute osseous abnormalities are seen.  IMPRESSION: Stable mild cardiomegaly post CABG.  No acute findings. Electronically Signed   By: Jeb Levering M.D.   On: 02/08/2018 01:05    Procedures Procedures  CRITICAL CARE Performed by: Sharyon Cable Total critical care time: 40 minutes Critical care time was exclusive of separately billable procedures and treating other patients. Critical care was necessary to treat or prevent imminent or life-threatening deterioration. Critical care was time spent personally by me on the following activities: development of treatment plan with patient and/or surrogate as well as nursing, discussions with consultants, evaluation of patient's response to treatment, examination of patient, obtaining history from patient or surrogate, ordering and performing treatments and interventions, ordering and review of laboratory studies, ordering and review of radiographic studies, pulse oximetry and re-evaluation of patient's condition. Patient with unstable angina requiring pain medications, and admission.  IV heparin has been ordered.  I discussed the case with the on-call cardiologist for admission  Medications Ordered in ED Medications  heparin ADULT infusion 100 units/mL (25000 units/251m sodium chloride 0.45%) (has no administration in time range)  heparin bolus via infusion 3,500 Units (has no administration in time range)  fentaNYL (SUBLIMAZE) injection 50 mcg (50 mcg Intravenous Given 02/08/18 0029)     Initial Impression / Assessment and Plan / ED Course  I have reviewed the triage vital signs and the nursing notes.  Pertinent labs & imaging results that were available during my care of the patient were reviewed by me and  considered in my medical decision making (see chart for details).     12:32 AM Patient with extensive history of CAD presents with chest pain.  It is not relieved with multiple doses of nitroglycerin Previous cath revealed extensive disease.  Will need cardiology consultation 1:18 AM Discussed with cardiology fellow.  Concern for unstable angina.  No recent cardiac cath did recommend CTO PCI if he she has refractory angina 1:38 AM I discussed the case with the on-call cardiologist.  Plan is for her to be admitted to the hospital.  We agreed to start heparin.  Patient denies any recent GI bleed symptoms.  If she should have any worsening chest pain she will be started on a nitroglycerin drip.  Patient has been updated on plan Final Clinical Impressions(s) / ED Diagnoses   Final diagnoses:  Unstable angina ([facility]    ED Discharge Orders    None       WRipley Fraise MD 02/08/18 0139

## 2018-02-08 NOTE — Progress Notes (Signed)
[place] for heparin Indication: chest pain/ACS  Allergies  Allergen Reactions  . Januvia [Sitagliptin] Nausea And Vomiting and Other (See Comments)    Pancreatitis (this was in Epic, but patient was unaware of this)   . Kiwi Extract Hives  . Shrimp [Shellfish Allergy] Hives  . Metformin And Related Diarrhea  . Penicillins Other (See Comments)    From childhood: Has patient had a PCN reaction causing immediate rash, facial/tongue/throat swelling, SOB or lightheadedness with hypotension: Unk Has patient had a PCN reaction causing severe rash involving mucus membranes or skin necrosis: Unk Has patient had a PCN reaction that required hospitalization: Unk Has patient had a PCN reaction occurring within the last 10 years: No If all of the above answers are "NO", then may proceed with Cephalosporin use.     Patient Measurements: Height: 5' 6"  (167.6 cm) Weight: 165 lb 6.4 oz (75 kg) IBW/kg (Calculated) : 59.3 Heparin Dosing Weight: 74.2 kg  Vital Signs: Temp: 98.3 F (36.8 C) (05/20 0008) Temp Source: Oral (05/20 0008) BP: 154/81 (05/20 1031) Pulse Rate: 77 (05/20 1031)  Labs: Recent Labs    02/08/18 0003 02/08/18 0027 02/08/18 0452 02/08/18 0754 02/08/18 0830  HGB 10.9*  --   --   --   --   HCT 34.2*  --   --   --   --   PLT 217  --   --   --   --   APTT  --  31  --   --   --   LABPROT  --  12.1  --   --   --   INR  --  0.90  --   --   --   HEPARINUNFRC  --   --   --   --  <0.10*  CREATININE 1.16*  --  0.96  --   --   TROPONINI  --   --   --  1.98*  --     Estimated Creatinine Clearance: 65.3 mL/min (by C-G formula based on SCr of 0.96 mg/dL).   Medical History: Past Medical History:  Diagnosis Date  . Anxiety   . Arthritis    "maybe in my left foot" (10/14/2017)  . Bladder prolapse, female, acquired 09/14/2017  . Depression   . High cholesterol   . History of stomach ulcers   . Hypertension   . Myocardial  infarction ([place]) 12/30/2004  . Type II diabetes mellitus ([facility])     Medications:  See medication history  Assessment: 57 yo lady to start heparin for CP.  She was not on anticoagulation PTA  Initial heparin level undetectable, Patient recently transferred to floor so the RN is unsure if any infusion issues; NO overt bleeding reported.  Goal of Therapy:  Heparin level 0.3-0.7 units/ml Monitor platelets by anticoagulation protocol: Yes   Plan:  Heparin 1500 unit bolus  Increase heparin gtt to 1200 units/hr Check heparin level 6 hours after start Daily heparin level and CBC while on heparin  Taelor Moncada L Briona Korpela 02/08/2018,10:46 AM

## 2018-02-08 NOTE — ED Notes (Signed)
Attempted to give report to floor nurse; nurse not available at this time ; will call back in 5 min

## 2018-02-08 NOTE — Progress Notes (Signed)
ANTICOAGULATION CONSULT NOTE  Pharmacy Consult:  Heparin Indication: chest pain/ACS  Allergies  Allergen Reactions  . Januvia [Sitagliptin] Nausea And Vomiting and Other (See Comments)    Pancreatitis (this was in Epic, but patient was unaware of this)   . Kiwi Extract Hives  . Shrimp [Shellfish Allergy] Hives  . Metformin And Related Diarrhea  . Penicillins Other (See Comments)    From childhood: Has patient had a PCN reaction causing immediate rash, facial/tongue/throat swelling, SOB or lightheadedness with hypotension: Unk Has patient had a PCN reaction causing severe rash involving mucus membranes or skin necrosis: Unk Has patient had a PCN reaction that required hospitalization: Unk Has patient had a PCN reaction occurring within the last 10 years: No If all of the above answers are "NO", then may proceed with Cephalosporin use.     Patient Measurements: Height: 5' 6"  (167.6 cm) Weight: 165 lb 6.4 oz (75 kg) IBW/kg (Calculated) : 59.3 Heparin Dosing Weight: 74 kg  Vital Signs: Temp: 98.5 F (36.9 C) (05/20 1626) Temp Source: Oral (05/20 1626) BP: 107/65 (05/20 1626) Pulse Rate: 78 (05/20 1626)  Labs: Recent Labs    02/08/18 0003 02/08/18 0027 02/08/18 0452 02/08/18 0754 02/08/18 0830 02/08/18 1318 02/08/18 1721  HGB 10.9*  --   --   --   --   --   --   HCT 34.2*  --   --   --   --   --   --   PLT 217  --   --   --   --   --   --   APTT  --  31  --   --   --   --   --   LABPROT  --  12.1  --   --   --   --   --   INR  --  0.90  --   --   --   --   --   HEPARINUNFRC  --   --   --   --  <0.10*  --  0.36  CREATININE 1.16*  --  0.96  --   --   --   --   TROPONINI  --   --   --  1.98*  --  3.20*  --     Estimated Creatinine Clearance: 65.3 mL/min (by C-G formula based on SCr of 0.96 mg/dL).    Assessment:  15 YOF continues on IV heparin for ACS.  Heparin level is therapeutic and toward the low end of normal.  No bleeding reported.  Goal of Therapy:   Heparin level 0.3-0.7 units/ml Monitor platelets by anticoagulation protocol: Yes    Plan:  Increase heparin gtt to 1250 units/hr F/U AM labs   Aysiah Jurado D. Mina Marble, PharmD, BCPS, BCCCP Pager:  [phone] 02/08/2018, 7:02 PM

## 2018-02-08 NOTE — Progress Notes (Addendum)
Progress Note  Patient Name: Victoria Eaton Date of Encounter: 02/08/2018  Primary Cardiologist: Pixie Casino, MD   Subjective   She has not had a recurrence of chest pain since being in the ER. Heparin drip running.  Inpatient Medications    Scheduled Meds: . amLODipine  5 mg Oral Daily  . aspirin EC  81 mg Oral Daily  . atorvastatin  80 mg Oral q1800  . buPROPion  150 mg Oral Daily  . canagliflozin  100 mg Oral QAC breakfast  . insulin aspart  0-15 Units Subcutaneous TID WC  . insulin aspart  0-5 Units Subcutaneous QHS  . Insulin Glargine  30 Units Subcutaneous Q2200  . isosorbide dinitrate  30 mg Oral TID  . lisinopril  10 mg Oral Daily  . metoprolol tartrate  25 mg Oral Q6H  . multivitamin with minerals  1 tablet Oral Daily  . sertraline  50 mg Oral Daily   Continuous Infusions: . heparin 1,000 Units/hr (02/08/18 0155)   PRN Meds: HYDROcodone-acetaminophen, nitroGLYCERIN, ondansetron (ZOFRAN) IV   Vital Signs    Vitals:   02/08/18 0430 02/08/18 0500 02/08/18 0530 02/08/18 0600  BP: (!) 164/81 (!) 149/77 (!) 147/73 (!) 153/73  Pulse: 75 70 68 69  Resp: 19 17 16 18   Temp:      TempSrc:      SpO2: 96% 97% 97% 97%  Weight:      Height:        Intake/Output Summary (Last 24 hours) at 02/08/2018 0715 Last data filed at 02/08/2018 0602 Gross per 24 hour  Intake -  Output 1000 ml  Net -1000 ml   Filed Weights   02/08/18 0002  Weight: 164 lb (74.4 kg)    Telemetry    Sinus  - Personally Reviewed  ECG    Sinus, ST depression I and V6 - Personally Reviewed  Physical Exam   GEN: No acute distress.   Neck: No JVD Cardiac: RRR, no murmurs, rubs, or gallops.  Respiratory: Clear to auscultation bilaterally. GI: Soft, nontender, non-distended  MS: No edema; No deformity. Neuro:  Nonfocal  Psych: Normal affect   Labs    Chemistry Recent Labs  Lab 02/08/18 0003 02/08/18 0452  NA 138 139  K 3.8 3.9  CL 109 107  CO2 22 24  GLUCOSE 226* 179*   BUN 26* 22*  CREATININE 1.16* 0.96  CALCIUM 9.1 9.2  GFRNONAA 50* >60  GFRAA 59* >60  ANIONGAP 7 8     Hematology Recent Labs  Lab 02/08/18 0003  WBC 11.2*  RBC 4.56  HGB 10.9*  HCT 34.2*  MCV 75.0*  MCH 23.9*  MCHC 31.9  RDW 18.4*  PLT 217    Cardiac EnzymesNo results for input(s): TROPONINI in the last 168 hours.  Recent Labs  Lab 02/08/18 0014  TROPIPOC 0.00     BNPNo results for input(s): BNP, PROBNP in the last 168 hours.   DDimer No results for input(s): DDIMER in the last 168 hours.   Radiology    Dg Chest 2 View  Result Date: 02/08/2018 CLINICAL DATA:  Mid chest pain. EXAM: CHEST - 2 VIEW COMPARISON:  01/01/2018 FINDINGS: Post median sternotomy and CABG. Stable mild cardiomegaly and mediastinal contours. The lungs are clear. Pulmonary vasculature is normal. No consolidation, pleural effusion, or pneumothorax. No acute osseous abnormalities are seen. IMPRESSION: Stable mild cardiomegaly post CABG.  No acute findings. Electronically Signed   By: Jeb Levering M.D.   On: 02/08/2018  01:05    Cardiac Studies   Heart cath 01/01/18:  Prox LAD to Mid LAD lesion is 90% stenosed. LIMA to LAD is patent.  Distal LAD to Dist LAD lesion is 85% stenosed, past the insertion of the LIMA.  Ost 1st Diag lesion is 80% stenosed. 1st Diag lesion is 90% stenosed. Lat 1st Diag lesion is 90% stenosed. SVG to diagonal is occluded.  Ost 1st Mrg to 1st Mrg lesion is 100% stenosed. SVG to OM is occluded.  Prox RCA lesion is 80% stenosed. Mid RCA to Dist RCA lesion is 70% stenosed. Dist RCA lesion is 70% stenosed. SVG to PDA is widely patent and provides collaterals to the occluded OM.  The left ventricular systolic function is normal.  LV end diastolic pressure is normal.  The left ventricular ejection fraction is 55-65% by visual estimate.  There is no aortic valve stenosis.   Continue medical therapy.  The grafts to LAD and RCA are patent.    The SVGs to the OM and  diagonal are occluded.  Both of these vessels were small in caliber.   Could consider CTO PCI attempt of the OM if she has refractory angina.  However, given how small in caliber the OM vessel is, there may be long term patency issues of a DES.   Patient Profile     57 y.o. female w/ HTN, HLD, and CAD s/p CABG in Jan 2019 and subsequent cath showing 2 occluded vein grafts presenting now with crescendo angina. Her angina has unfortunately not improved on medical therapy. After her hospitalization 1 month ago, the plan was to attempt CTO revascularization if medical therapy unsuccessful. Currently however she is not on maximal anti-anginal therapy.   I increased her imdur today. She is under stress as her husband (alcoholic) has been in the hospital here at [facility] for three weeks and is now awaiting placement.  Assessment & Plan    1. Chest pain, CAD s/p CABG 09/2017, cath 01/01/18 with occluded SVG to OM and diagonal, occluded distal LAD past insertion of LIMA - troponin x 1 negative - EKG with ST depression in V6 and I, not new and actually improved from EKG in April 2019 - pt has been chest pain free since being in the ER - heparin drip running, has not required nitro and has not had anti-anginals yet this morning - I switched her isordil TID to imdur 90 mg (was on 60 mg at home)  - I decreased her lisinopril to 5 mg and increased her norvasc to 10 mg - reviewed films with Dr. Gwenlyn Found, she is NPO for possible repeat heart cath - will cycle troponin; if they remain negative, will ambulate her tomorrow and D/C heparin  - can consider ranexa if she continues to have chest pain   2. Leukocytosis - WBC 11.2 - CXR without infectious process - will collect UA - patient is afebrile   3.  DM - A1c 8.6% 11/2017 - SSI   4. HTN - on norvasc, isordil, lopressor, lisinopril - pressures have been mildly elevated - has not received any anti-anginals yet this morning   5. HLD - 10/10/2017:  Cholesterol 137; HDL 38; LDL Cholesterol 70; Triglycerides 144; VLDL 29 - continue lipitor    For questions or updates, please contact [place] Please consult [email or web address] for contact info under Cardiology/STEMI.      Signed, Ledora Bottcher, PA  02/08/2018, 7:15 AM     Agree with note by Fabian Sharp PA-C  Unfortunate 57 year old female now 5 months status post bypass grafting x4 in January of this year he was cath on 01/01/2018 revealing 2 veins that had occluded to the obtuse marginal branch and diagonal branch with a patent vein to the distal right and a patent LIMA to diffusely diseased LAD.  Medical therapy was recommended.  She comes in today with progressive angina having taken 3 sublingual nitroglycerin.  She is currently pain-free.  She is on IV heparin.  Enzymes are negative her EKG shows no acute changes.  At this point, I do not feel compelled to recommend repeat cardiac catheterization since is only been 5 weeks since her last cath but rather changes to her antianginal medications including increasing amlodipine, indoor and the addition of ranolazine.  She does continue to smoke.  Her exam is benign.  We will discontinue the heparin tomorrow, ambulate her and potentially discharge her the following day.   Lorretta Harp, M.D., [place], [facility], [place] [address] [place], [place]  [address]  [phone] 02/08/2018 9:27 AM

## 2018-02-08 NOTE — Progress Notes (Signed)
FYI page sent to [facility] in regards to last troponin of 3.20. Pt has remained CP free since arrival to ED.

## 2018-02-08 NOTE — H&P (Signed)
CARDIOLOGY H&P  HPI: Victoria Eaton is a 57 y.o. female w/ CAD (s/p PCI to RCA in 2009 and recent CABG in 09/2017 with LIMA-LAD, reverse SVG-D1, reverse SVG-OM, and reverse SVG-RCA), HTN, HLD, and depression presenting with crescendo angina.   Of note, patient seen approximately 1 month ago for similar symptoms. She underwent 4vCABG in January 2019 and subsequently (in April) presenting with worsening angina. Repeat cath revealed  2 occluded vein grafts. She was treated medically with plans for possible CTO revascularization if refractory chest pain.   Patient has been taking SLN every day since her last admission. Typically this results in resolution of her CP. Today however she took multiple SLN's at home with no resolution of her pain.   Review of Systems:     Cardiac Review of Systems: {Y] = yes [ ]  = no  Chest Pain [ Y   ]  Resting SOB [   ] Exertional SOB  [  ]  Orthopnea [  ]   Pedal Edema [   ]    Palpitations [  ] Syncope  [  ]   Presyncope [   ]  General Review of Systems: [Y] = yes [  ]=no Constitional: recent weight change [  ]; anorexia [  ]; fatigue [  ]; nausea [  ]; night sweats [  ]; fever [  ]; or chills [  ];                                                                     Dental: poor dentition[  ];   Eye : blurred vision [  ]; diplopia [   ]; vision changes [  ];  Amaurosis fugax[  ]; Resp: cough [  ];  wheezing[  ];  hemoptysis[  ]; shortness of breath[  ]; paroxysmal nocturnal dyspnea[  ]; dyspnea on exertion[  ]; or orthopnea[  ];  GI:  gallstones[  ], vomiting[  ];  dysphagia[  ]; melena[  ];  hematochezia [  ]; heartburn[  ];   GU: kidney stones [  ]; hematuria[  ];   dysuria [  ];  nocturia[  ];               Skin: rash [  ], swelling[  ];, hair loss[  ];  peripheral edema[  ];  or itching[  ]; Musculosketetal: myalgias[  ];  joint swelling[  ];  joint erythema[  ];  joint pain[  ];  back pain[  ];  Heme/Lymph: bruising[  ];  bleeding[  ];  anemia[  ];  Neuro:  TIA[  ];  headaches[  ];  stroke[  ];  vertigo[  ];  seizures[  ];   paresthesias[  ];  difficulty walking[  ];  Psych:depression[  ]; anxiety[  ];  Endocrine: diabetes[  ];  thyroid dysfunction[  ];  Other:  Past Medical History:  Diagnosis Date  . Anxiety   . Arthritis    "maybe in my left foot" (10/14/2017)  . Bladder prolapse, female, acquired 09/14/2017  . Depression   . High cholesterol   . History of stomach ulcers   . Hypertension   . Myocardial infarction ([place]) 12/30/2004  . Type  II diabetes mellitus ([place])     Prior to Admission medications   Medication Sig Start Date End Date Taking? Authorizing Provider  amLODipine (NORVASC) 5 MG tablet Take 1 tablet (5 mg total) by mouth daily. 12/07/17  Yes Shawnee Knapp, MD  aspirin EC 325 MG EC tablet Take 1 tablet (325 mg total) by mouth daily. 01/02/18  Yes Strader, Fransisco Hertz, PA-C  atorvastatin (LIPITOR) 80 MG tablet Take 1 tablet (80 mg total) by mouth daily at 6 PM. 12/07/17  Yes Shawnee Knapp, MD  buPROPion (WELLBUTRIN XL) 150 MG 24 hr tablet TAKE ONE TABLET BY MOUTH ONE TIME DAILY 11/09/17  Yes Tenna Delaine D, PA-C  EPINEPHrine (EPIPEN 2-PAK) 0.3 mg/0.3 mL IJ SOAJ injection Inject 0.3 mLs (0.3 mg total) into the muscle once as needed. Patient taking differently: Inject 0.3 mg into the muscle once as needed (for an allergic reaction).  05/20/17  Yes Timmothy Euler, [place] D, PA-C  FARXIGA 10 MG TABS tablet TAKE ONE TABLET BY MOUTH ONE TIME DAILY 11/16/17  Yes Tenna Delaine D, PA-C  HYDROcodone-acetaminophen (NORCO) 5-325 MG tablet Take 1 tablet by mouth every 6 (six) hours as needed for moderate pain. 11/25/17  Yes Sagardia, Ines Bloomer, MD  Insulin Glargine (LANTUS SOLOSTAR) 100 UNIT/ML Solostar Pen Inject 10 Units into the skin daily at 10 pm. Increase dose by 2u every 3 days until fasting a.m. cbgs <150 Patient taking differently: Inject 30 Units into the skin daily at 10 pm.  12/07/17  Yes Shawnee Knapp, MD  isosorbide mononitrate (IMDUR)  60 MG 24 hr tablet Take 1 tablet (60 mg total) by mouth daily. 01/19/18  Yes Almyra Deforest, PA  lisinopril (PRINIVIL,ZESTRIL) 10 MG tablet Take 1 tablet (10 mg total) by mouth daily. 01/19/18  Yes Almyra Deforest, PA  loratadine (CLARITIN) 10 MG tablet Take 1 tablet (10 mg total) by mouth daily. Patient taking differently: Take 10 mg by mouth daily as needed for allergies.  05/01/17  Yes Timmothy Euler, [place] D, PA-C  metoprolol tartrate (LOPRESSOR) 25 MG tablet Take 1 tablet (25 mg total) by mouth 2 (two) times daily. 10/26/17  Yes Timmothy Euler, [place] D, PA-C  Multiple Vitamin (MULTIVITAMIN WITH MINERALS) TABS tablet Take 1 tablet by mouth daily.   Yes [provider]  nitroGLYCERIN (NITROSTAT) 0.4 MG SL tablet Place 1 tablet (0.4 mg total) under the tongue every 5 (five) minutes as needed for chest pain. 01/19/18 04/19/18 Yes Meng, Isaac Laud, PA  Omega-3 Fatty Acids (FISH OIL CONCENTRATE) 300 MG CAPS Take 1 capsule by mouth daily.   Yes [provider]  oxyCODONE (OXY IR/ROXICODONE) 5 MG immediate release tablet Take 1 tablet (5 mg total) by mouth every 8 (eight) hours as needed for severe pain. 11/23/17  Yes Barrett, Erin R, PA-C  sertraline (ZOLOFT) 50 MG tablet TAKE ONE TABLET BY MOUTH ONE TIME DAILY 12/04/17  Yes Shawnee Knapp, MD  TURMERIC PO Take 1 tablet by mouth daily.   Yes [provider]  Insulin Pen Needle (PEN NEEDLES) 32G X 4 MM MISC 1 Units by Does not apply route daily. 12/07/17   Shawnee Knapp, MD      Allergies  Allergen Reactions  . Januvia [Sitagliptin] Nausea And Vomiting and Other (See Comments)    Pancreatitis (this was in Epic, but patient was unaware of this)   . Kiwi Extract Hives  . Shrimp [Shellfish Allergy] Hives  . Metformin And Related Diarrhea  . Penicillins Other (See Comments)  From childhood: Has patient had a PCN reaction causing immediate rash, facial/tongue/throat swelling, SOB or lightheadedness with hypotension: Unk Has patient had a PCN reaction causing  severe rash involving mucus membranes or skin necrosis: Unk Has patient had a PCN reaction that required hospitalization: Unk Has patient had a PCN reaction occurring within the last 10 years: No If all of the above answers are "NO", then may proceed with Cephalosporin use.     Social History   Socioeconomic History  . Marital status: Married    Spouse name: Glendell Docker  . Number of children: 1  . Years of education: Not on file  . Highest education level: Not on file  Occupational History  . Not on file  Social Needs  . Financial resource strain: Not on file  . Food insecurity:    Worry: Not on file    Inability: Not on file  . Transportation needs:    Medical: Not on file    Non-medical: Not on file  Tobacco Use  . Smoking status: Former Smoker    Packs/day: 0.12    Years: 45.00    Pack years: 5.40    Types: Cigarettes    Start date: 05/01/1972    Last attempt to quit: 09/30/2017    Years since quitting: 0.3  . Smokeless tobacco: Never Used  Substance and Sexual Activity  . Alcohol use: Yes    Alcohol/week: 8.4 oz    Types: 14 Glasses of wine per week  . Drug use: No  . Sexual activity: Not Currently  Lifestyle  . Physical activity:    Days per week: Not on file    Minutes per session: Not on file  . Stress: Not on file  Relationships  . Social connections:    Talks on phone: Not on file    Gets together: Not on file    Attends religious service: Not on file    Active member of club or organization: Not on file    Attends meetings of clubs or organizations: Not on file    Relationship status: Not on file  . Intimate partner violence:    Fear of current or ex partner: Not on file    Emotionally abused: Not on file    Physically abused: Not on file    Forced sexual activity: Not on file  Other Topics Concern  . Not on file  Social History Narrative  . Not on file    Family History  Problem Relation Age of Onset  . Heart disease Father   . Hyperlipidemia  Father   . Hypertension Father     PHYSICAL EXAM: Vitals:   02/08/18 0030 02/08/18 0100  BP: 140/84 (!) 156/73  Pulse: 86 81  Resp: 18 20  Temp:    SpO2: 97% 93%   General:  Uncomfortable, laying in bed HEENT: normal Neck: supple. no JVD.  Cor: PMI nondisplaced. Regular rate & rhythm. No rubs, gallops or murmurs. Lungs: clear Abdomen: soft, nontender, nondistended. No hepatosplenomegaly. No bruits or masses. Good bowel sounds. Extremities: no cyanosis, clubbing, rash, edema Neuro: alert & oriented x 3, cranial nerves grossly intact. moves all 4 extremities w/o difficulty. Affect pleasant.  ECG: NSR, HR 84, normal axis, anterior infarct, LVH, 26m ST depressions in limb leads with nonspecific ST/T wave changes throughout precordium  Results for orders placed or performed during the hospital encounter of 02/07/18 (from the past 24 hour(s))  Basic metabolic panel     Status: Abnormal   Collection  Time: 02/08/18 12:03 AM  Result Value Ref Range   Sodium 138 135 - 145 mmol/L   Potassium 3.8 3.5 - 5.1 mmol/L   Chloride 109 101 - 111 mmol/L   CO2 22 22 - 32 mmol/L   Glucose, Bld 226 (H) 65 - 99 mg/dL   BUN 26 (H) 6 - 20 mg/dL   Creatinine, Ser 1.16 (H) 0.44 - 1.00 mg/dL   Calcium 9.1 8.9 - 10.3 mg/dL   GFR calc non Af Amer 50 (L) >60 mL/min   GFR calc Af Amer 59 (L) >60 mL/min   Anion gap 7 5 - 15  CBC     Status: Abnormal   Collection Time: 02/08/18 12:03 AM  Result Value Ref Range   WBC 11.2 (H) 4.0 - 10.5 K/uL   RBC 4.56 3.87 - 5.11 MIL/uL   Hemoglobin 10.9 (L) 12.0 - 15.0 g/dL   HCT 34.2 (L) 36.0 - 46.0 %   MCV 75.0 (L) 78.0 - 100.0 fL   MCH 23.9 (L) 26.0 - 34.0 pg   MCHC 31.9 30.0 - 36.0 g/dL   RDW 18.4 (H) 11.5 - 15.5 %   Platelets 217 150 - 400 K/uL  I-stat troponin, ED     Status: None   Collection Time: 02/08/18 12:14 AM  Result Value Ref Range   Troponin i, poc 0.00 0.00 - 0.08 ng/mL   Comment 3          I-Stat beta hCG blood, ED     Status: Abnormal    Collection Time: 02/08/18 12:15 AM  Result Value Ref Range   I-stat hCG, quantitative 9.1 (H) <5 mIU/mL   Comment 3          Protime-INR     Status: None   Collection Time: 02/08/18 12:27 AM  Result Value Ref Range   Prothrombin Time 12.1 11.4 - 15.2 seconds   INR 0.90   APTT     Status: None   Collection Time: 02/08/18 12:27 AM  Result Value Ref Range   aPTT 31 24 - 36 seconds   Dg Chest 2 View  Result Date: 02/08/2018 CLINICAL DATA:  Mid chest pain. EXAM: CHEST - 2 VIEW COMPARISON:  01/01/2018 FINDINGS: Post median sternotomy and CABG. Stable mild cardiomegaly and mediastinal contours. The lungs are clear. Pulmonary vasculature is normal. No consolidation, pleural effusion, or pneumothorax. No acute osseous abnormalities are seen. IMPRESSION: Stable mild cardiomegaly post CABG.  No acute findings. Electronically Signed   By: Jeb Levering M.D.   On: 02/08/2018 01:05   ASSESSMENT: Latunya Kissick is a 57 y.o. female w/ HTN, HLD, and CAD s/p CABG in Jan 2019 and subsequent cath showing 2 occluded vein grafts presenting now with crescendo angina. Her angina has unfortunately not improved on medical therapy. After her hospitalization 1 month ago, the plan was to attempt CTO revascularization if medical therapy unsuccessful. Currently however she is not on maximal anti-anginal therapy.   PLAN/DISCUSSION: #) Crescendo angina: - NPO except meds - reduce aspirin to 60m daily - increase metoprolol to [id number] - increase Imdur to [id number] - cont other home antihypertensives - may consider starting ranolazine in addition to above changes in anti-anginal regimen vs attempt at CTO revascularization  #) DM - cont home SGLT2 inhibitor - cont home lantus - ISS AC/HS  AntMarcie MowersD Cardiology Fellow, PGY-5

## 2018-02-09 LAB — CBC
HCT: 35.6 % — ABNORMAL LOW (ref 36.0–46.0)
HEMOGLOBIN: 11 g/dL — AB (ref 12.0–15.0)
MCH: 23 pg — ABNORMAL LOW (ref 26.0–34.0)
MCHC: 30.9 g/dL (ref 30.0–36.0)
MCV: 74.3 fL — ABNORMAL LOW (ref 78.0–100.0)
PLATELETS: 225 10*3/uL (ref 150–400)
RBC: 4.79 MIL/uL (ref 3.87–5.11)
RDW: 18.3 % — ABNORMAL HIGH (ref 11.5–15.5)
WBC: 7.4 10*3/uL (ref 4.0–10.5)

## 2018-02-09 LAB — GLUCOSE, CAPILLARY
GLUCOSE-CAPILLARY: 147 mg/dL — AB (ref 65–99)
GLUCOSE-CAPILLARY: 206 mg/dL — AB (ref 65–99)
GLUCOSE-CAPILLARY: 106 mg/dL — AB (ref 65–99)
GLUCOSE-CAPILLARY: 272 mg/dL — AB (ref 65–99)

## 2018-02-09 LAB — HEPARIN LEVEL (UNFRACTIONATED): HEPARIN UNFRACTIONATED: 0.42 [IU]/mL (ref 0.30–0.70)

## 2018-02-09 NOTE — Progress Notes (Signed)
Progress Note  Patient Name: Victoria Eaton Date of Encounter: 02/09/2018  Primary Cardiologist: Pixie Casino, MD   Subjective   She has not had a recurrence of chest pain since being in the ER. Heparin drip running.  She ruled in for non-STEMI.  We will discontinue the heparin and ambulate her today with anticipation of discharge tomorrow.  Inpatient Medications    Scheduled Meds: . amLODipine  10 mg Oral Daily  . aspirin EC  81 mg Oral Daily  . atorvastatin  80 mg Oral q1800  . buPROPion  150 mg Oral Daily  . canagliflozin  100 mg Oral QAC breakfast  . insulin aspart  0-15 Units Subcutaneous TID WC  . insulin aspart  0-5 Units Subcutaneous QHS  . insulin glargine  30 Units Subcutaneous QHS  . isosorbide mononitrate  90 mg Oral Daily  . lisinopril  5 mg Oral Daily  . metoprolol tartrate  25 mg Oral Q6H  . multivitamin with minerals  1 tablet Oral Daily  . sertraline  50 mg Oral Daily   Continuous Infusions: . heparin 1,250 Units/hr (02/08/18 2036)   PRN Meds: HYDROcodone-acetaminophen, nitroGLYCERIN, ondansetron (ZOFRAN) IV   Vital Signs    Vitals:   02/08/18 2124 02/08/18 2347 02/09/18 0532 02/09/18 0811  BP: 134/74 (!) 114/56 (!) 148/71 130/62  Pulse: 62 68 64 (!) 56  Resp: 17 15 18    Temp: 98.4 F (36.9 C) 98.1 F (36.7 C) 98.4 F (36.9 C) 98 F (36.7 C)  TempSrc: Oral Oral Oral Oral  SpO2: 94% 94% 98% 96%  Weight:   164 lb 12.8 oz (74.8 kg)   Height:        Intake/Output Summary (Last 24 hours) at 02/09/2018 1103 Last data filed at 02/09/2018 5797 Gross per 24 hour  Intake 1764.85 ml  Output 800 ml  Net 964.85 ml   Filed Weights   02/08/18 0002 02/08/18 1031 02/09/18 0532  Weight: 164 lb (74.4 kg) 165 lb 6.4 oz (75 kg) 164 lb 12.8 oz (74.8 kg)    Telemetry    Sinus  - Personally Reviewed  ECG    Sinus, ST depression I and V6 - Personally Reviewed  Physical Exam   GEN: No acute distress.   Neck: No JVD Cardiac: RRR, no murmurs, rubs,  or gallops.  Respiratory: Clear to auscultation bilaterally. GI: Soft, nontender, non-distended  MS: No edema; No deformity. Neuro:  Nonfocal  Psych: Normal affect   Labs    Chemistry Recent Labs  Lab 02/08/18 0003 02/08/18 0452  NA 138 139  K 3.8 3.9  CL 109 107  CO2 22 24  GLUCOSE 226* 179*  BUN 26* 22*  CREATININE 1.16* 0.96  CALCIUM 9.1 9.2  GFRNONAA 50* >60  GFRAA 59* >60  ANIONGAP 7 8     Hematology Recent Labs  Lab 02/08/18 0003 02/09/18 0644  WBC 11.2* 7.4  RBC 4.56 4.79  HGB 10.9* 11.0*  HCT 34.2* 35.6*  MCV 75.0* 74.3*  MCH 23.9* 23.0*  MCHC 31.9 30.9  RDW 18.4* 18.3*  PLT 217 225    Cardiac Enzymes Recent Labs  Lab 02/08/18 0754 02/08/18 1318 02/08/18 1956  TROPONINI 1.98* 3.20* 2.71*    Recent Labs  Lab 02/08/18 0014  TROPIPOC 0.00     BNPNo results for input(s): BNP, PROBNP in the last 168 hours.   DDimer No results for input(s): DDIMER in the last 168 hours.   Radiology    Dg Chest 2 View  Result Date: 02/08/2018 CLINICAL DATA:  Mid chest pain. EXAM: CHEST - 2 VIEW COMPARISON:  01/01/2018 FINDINGS: Post median sternotomy and CABG. Stable mild cardiomegaly and mediastinal contours. The lungs are clear. Pulmonary vasculature is normal. No consolidation, pleural effusion, or pneumothorax. No acute osseous abnormalities are seen. IMPRESSION: Stable mild cardiomegaly post CABG.  No acute findings. Electronically Signed   By: Jeb Levering M.D.   On: 02/08/2018 01:05    Cardiac Studies   Heart cath 01/01/18:  Prox LAD to Mid LAD lesion is 90% stenosed. LIMA to LAD is patent.  Distal LAD to Dist LAD lesion is 85% stenosed, past the insertion of the LIMA.  Ost 1st Diag lesion is 80% stenosed. 1st Diag lesion is 90% stenosed. Lat 1st Diag lesion is 90% stenosed. SVG to diagonal is occluded.  Ost 1st Mrg to 1st Mrg lesion is 100% stenosed. SVG to OM is occluded.  Prox RCA lesion is 80% stenosed. Mid RCA to Dist RCA lesion is 70%  stenosed. Dist RCA lesion is 70% stenosed. SVG to PDA is widely patent and provides collaterals to the occluded OM.  The left ventricular systolic function is normal.  LV end diastolic pressure is normal.  The left ventricular ejection fraction is 55-65% by visual estimate.  There is no aortic valve stenosis.   Continue medical therapy.  The grafts to LAD and RCA are patent.    The SVGs to the OM and diagonal are occluded.  Both of these vessels were small in caliber.   Could consider CTO PCI attempt of the OM if she has refractory angina.  However, given how small in caliber the OM vessel is, there may be long term patency issues of a DES.   Patient Profile     56 y.o. female w/ HTN, HLD, and CAD s/p CABG in Jan 2019 and subsequent cath showing 2 occluded vein grafts presenting now with crescendo angina. Her angina has unfortunately not improved on medical therapy. After her hospitalization 1 month ago, the plan was to attempt CTO revascularization if medical therapy unsuccessful. Currently however she is not on maximal anti-anginal therapy.   I increased her imdur today. She is under stress as her husband (alcoholic) has been in the hospital here at [facility] for three weeks and is now awaiting placement.  Assessment & Plan    1. Chest pain, CAD s/p CABG 09/2017, cath 01/01/18 with occluded SVG to OM and diagonal, occluded distal LAD past insertion of LIMA - troponin positive peaking at 3.2 and then declining.  Then will be to discontinue heparin today, ambulate her with cardiac rehab anticipate discharge tomorrow if she has no recurrent chest pain. - EKG with ST depression in V6 and I, not new and actually improved from EKG in April 2019 - pt has been chest pain free since being in the ER - heparin drip running, has not required nitro . - I switched her isordil TID to imdur 90 mg (was on 60 mg at home)  - I decreased her lisinopril to 5 mg and increased her norvasc to 10 mg    2.  Leukocytosis - WBC 11.2--->7.4 - CXR without infectious process - will collect UA - patient is afebrile   3.  DM - A1c 8.6% 11/2017 - SSI   4. HTN - on norvasc, isordil, lopressor, lisinopril - pressures have been mildly elevated - has not received any anti-anginals yet this morning   5. HLD - 10/10/2017: Cholesterol 137; HDL 38; LDL Cholesterol  70; Triglycerides 144; VLDL 29 - continue lipitor  Surprisingly her enzymes were positive with a peak of 3.2.  She had no recurrent chest pain.  Plan will be to discontinue heparin and ambulate her today with anticipation of discharge tomorrow on optimal medical therapy.  For questions or updates, please contact [place] Please consult [email or web address] for contact info under Cardiology/STEMI.      Signed, Quay Burow, MD  02/09/2018, 11:03 AM     Agree with note by Fabian Sharp PA-C  Unfortunate 57 year old female now 5 months status post bypass grafting x4 in January of this year he was cath on 01/01/2018 revealing 2 veins that had occluded to the obtuse marginal branch and diagonal branch with a patent vein to the distal right and a patent LIMA to diffusely diseased LAD.  Medical therapy was recommended.  She comes in today with progressive angina having taken 3 sublingual nitroglycerin.  She is currently pain-free.  She is on IV heparin.  Enzymes are negative her EKG shows no acute changes.  At this point, I do not feel compelled to recommend repeat cardiac catheterization since is only been 5 weeks since her last cath but rather changes to her antianginal medications including increasing amlodipine, indoor and the addition of ranolazine.  She does continue to smoke.  Her exam is benign.  We will discontinue the heparin tomorrow, ambulate her and potentially discharge her the following day.   Lorretta Harp, M.D., [place], [facility], [place] [address] [place], [place]   [id number]  [phone] 02/09/2018 11:03 AM

## 2018-02-09 NOTE — Progress Notes (Signed)
RN ambulated around unit with pt. Pt tolerated well and had no complaints.

## 2018-02-09 NOTE — Progress Notes (Signed)
ANTICOAGULATION CONSULT NOTE  Pharmacy Consult:  Heparin Indication: chest pain/ACS  Allergies  Allergen Reactions  . Januvia [Sitagliptin] Nausea And Vomiting and Other (See Comments)    Pancreatitis (this was in Epic, but patient was unaware of this)   . Kiwi Extract Hives  . Shrimp [Shellfish Allergy] Hives  . Metformin And Related Diarrhea  . Penicillins Other (See Comments)    From childhood: Has patient had a PCN reaction causing immediate rash, facial/tongue/throat swelling, SOB or lightheadedness with hypotension: Unk Has patient had a PCN reaction causing severe rash involving mucus membranes or skin necrosis: Unk Has patient had a PCN reaction that required hospitalization: Unk Has patient had a PCN reaction occurring within the last 10 years: No If all of the above answers are "NO", then may proceed with Cephalosporin use.     Patient Measurements: Height: 5' 6"  (167.6 cm) Weight: 164 lb 12.8 oz (74.8 kg) IBW/kg (Calculated) : 59.3 Heparin Dosing Weight: 74 kg  Vital Signs: Temp: 98.4 F (36.9 C) (05/21 0532) Temp Source: Oral (05/21 0532) BP: 148/71 (05/21 0532) Pulse Rate: 64 (05/21 0532)  Labs: Recent Labs    02/08/18 0003 02/08/18 0027 02/08/18 0452 02/08/18 0754 02/08/18 0830 02/08/18 1318 02/08/18 1721 02/08/18 1956 02/09/18 0644  HGB 10.9*  --   --   --   --   --   --   --  11.0*  HCT 34.2*  --   --   --   --   --   --   --  35.6*  PLT 217  --   --   --   --   --   --   --  225  APTT  --  31  --   --   --   --   --   --   --   LABPROT  --  12.1  --   --   --   --   --   --   --   INR  --  0.90  --   --   --   --   --   --   --   HEPARINUNFRC  --   --   --   --  <0.10*  --  0.36  --  0.42  CREATININE 1.16*  --  0.96  --   --   --   --   --   --   TROPONINI  --   --   --  1.98*  --  3.20*  --  2.71*  --    Estimated Creatinine Clearance: 65.2 mL/min (by C-G formula based on SCr of 0.96 mg/dL).  Assessment:  65 YOF continues on IV heparin  for ACS while awaiting possible cath.  Heparin level remains therapeutic: 0.42, CBC stable, no overt bleeding or infusion issues reported  Goal of Therapy:  Heparin level 0.3-0.7 units/ml Monitor platelets by anticoagulation protocol: Yes   Plan:  Continue heparin gtt at 1250 units/hr  Daily heparin level and CBC while on heparin Monitor for s/sx of bleeding  Georga Bora, PharmD Clinical Pharmacist 02/09/2018 8:10 AM

## 2018-02-09 NOTE — Progress Notes (Signed)
CARDIAC REHAB PHASE I   PRE:  Rate/Rhythm: 66 SR  BP:  Supine:   Sitting: 117/63  Standing:    SaO2:   MODE:  Ambulation: 950 ft   POST:  Rate/Rhythm: 75 SR  BP:  Supine:   Sitting: 125/61  Standing:    SaO2: 98%RA 1400-1510 Pt walked 950 ft with steady fast pace. No CP. Tolerated well. MI education completed. Gave fake cigarette and smoking cessation handout. Pt knows she needs to quit. Has been under a lot of stress lately with husband's illness. Discussed MI restrictions, NTG use, ex ed and heart healthy diet. Referring again to [place] CRP 2. Pt stated program has been in contact with her.   Graylon Good, RN BSN  02/09/2018 3:06 PM

## 2018-02-10 ENCOUNTER — Telehealth: Payer: Self-pay | Admitting: Cardiology

## 2018-02-10 ENCOUNTER — Other Ambulatory Visit: Payer: Self-pay | Admitting: Family Medicine

## 2018-02-10 DIAGNOSIS — I214 Non-ST elevation (NSTEMI) myocardial infarction: Secondary | ICD-10-CM

## 2018-02-10 LAB — GLUCOSE, CAPILLARY
GLUCOSE-CAPILLARY: 226 mg/dL — AB (ref 65–99)
Glucose-Capillary: 134 mg/dL — ABNORMAL HIGH (ref 65–99)

## 2018-02-10 LAB — CBC
HEMATOCRIT: 35.9 % — AB (ref 36.0–46.0)
Hemoglobin: 11.1 g/dL — ABNORMAL LOW (ref 12.0–15.0)
MCH: 23.2 pg — ABNORMAL LOW (ref 26.0–34.0)
MCHC: 30.9 g/dL (ref 30.0–36.0)
MCV: 75.1 fL — AB (ref 78.0–100.0)
Platelets: 198 10*3/uL (ref 150–400)
RBC: 4.78 MIL/uL (ref 3.87–5.11)
RDW: 18.3 % — ABNORMAL HIGH (ref 11.5–15.5)
WBC: 6.5 10*3/uL (ref 4.0–10.5)

## 2018-02-10 LAB — HEPARIN LEVEL (UNFRACTIONATED): Heparin Unfractionated: <0.1 IU/mL — ABNORMAL LOW (ref 0.30–0.70)

## 2018-02-10 MED ORDER — ASPIRIN 81 MG PO TBEC: 81.0000 mg | DELAYED_RELEASE_TABLET | Freq: Every day | ORAL | 3 refills | Status: DC

## 2018-02-10 MED ORDER — ZOLPIDEM TARTRATE 5 MG PO TABS: 5.0000 mg | ORAL_TABLET | Freq: Every evening | ORAL | Status: DC | PRN

## 2018-02-10 MED ORDER — VARENICLINE TARTRATE 0.5 MG X 11 & 1 MG X 42 PO MISC: ORAL | 0 refills | Status: DC

## 2018-02-10 MED ORDER — ISOSORBIDE MONONITRATE ER 30 MG PO TB24: 90.0000 mg | ORAL_TABLET | Freq: Every day | ORAL | 11 refills | Status: DC

## 2018-02-10 MED ORDER — ZOLPIDEM TARTRATE 5 MG PO TABS: 5.0000 mg | ORAL_TABLET | Freq: Every evening | ORAL | 0 refills | Status: DC | PRN

## 2018-02-10 MED ORDER — AMLODIPINE BESYLATE 10 MG PO TABS: 5.0000 mg | ORAL_TABLET | Freq: Every day | ORAL | 3 refills | Status: DC

## 2018-02-10 MED ORDER — LISINOPRIL 5 MG PO TABS: 5.0000 mg | ORAL_TABLET | Freq: Every day | ORAL | 3 refills | Status: DC

## 2018-02-10 MED ORDER — FREESTYLE LIBRE 14 DAY SENSOR MISC: 2.0000 | Freq: Every morning | 0 refills | Status: DC

## 2018-02-10 NOTE — Progress Notes (Signed)
Patient has signed and been given copy of consent for CGM/Freestyle [place] research study. MD also notified.  Education done regarding application and changing CGM sensor (alternate every 14 days on back of arms), 1 hour warm-up, use of glucometer/where to buy strips, how to scan CGM for glucose reading and information for PCP. Patient has been given [employer] reader and first sensor for use. Patient has also been given educational packet regarding use CGM sensor including the 1-800 toll free number for any questions, problems or needs related to the [facility] sensors or reader.  Patient to be given Rx. For sensors with prescriptions/discharge paper work.  Sensor applied by patient to left arm at 13:05.  Explained that glucose readings will not be available until 1 hour after application. Patient understands that Diabetes coordinator will call them 2 times after discharge (between days 7-12 after d/c from hospital and between days 30-25 after d/c from hospital). Patient verbalizes understanding of use of Freestyle [place]re CGM and was told that any issues with blood sugars/diabetes will need to be addressed by PCP.  Diabetes Quality of Life Survey administered to patient.   Thanks, Barnie Alderman, RN, MSN, CDE Diabetes Coordinator Inpatient Diabetes Program [phone] (Team Pager from 8am to 5pm)

## 2018-02-10 NOTE — Plan of Care (Signed)
  Problem: Clinical Measurements: Goal: Will remain free from infection Outcome: Progressing Note:  No s/s of infection noted. Goal: Cardiovascular complication will be avoided Outcome: Progressing Note:  No s/s of cardiovascular complications.   Problem: Activity: Goal: Risk for activity intolerance will decrease Outcome: Progressing Note:  Ambulates in room and hall without difficulty.   Problem: Clinical Measurements: Goal: Respiratory complications will improve Outcome: Not Applicable

## 2018-02-10 NOTE — Telephone Encounter (Signed)
Currently admitted.

## 2018-02-10 NOTE — Discharge Summary (Addendum)
Discharge Summary    Patient ID: Victoria Eaton,  MRN: [id number], DOB/AGE: [birth year] 59 y.o.  Admit date: 02/07/2018 Discharge date: 02/10/2018  Primary Care Provider: Shawnee Knapp Primary Cardiologist: Pixie Casino, MD  Discharge Diagnoses    Principal Problem:   Coronary artery disease involving native coronary artery of native heart with angina pectoris [facility]) Active Problems:   Type 2 diabetes mellitus without complication, without long-term current use of insulin (HCC)   Essential hypertension   Tobacco abuse   History of MI (myocardial infarction)   Hyperlipidemia LDL goal <70   Unstable angina (HCC)   Allergies Allergies  Allergen Reactions  . Januvia [Sitagliptin] Nausea And Vomiting and Other (See Comments)    Pancreatitis (this was in Epic, but patient was unaware of this)   . Kiwi Extract Hives  . Shrimp [Shellfish Allergy] Hives  . Metformin And Related Diarrhea  . Penicillins Other (See Comments)    From childhood: Has patient had a PCN reaction causing immediate rash, facial/tongue/throat swelling, SOB or lightheadedness with hypotension: Unk Has patient had a PCN reaction causing severe rash involving mucus membranes or skin necrosis: Unk Has patient had a PCN reaction that required hospitalization: Unk Has patient had a PCN reaction occurring within the last 10 years: No If all of the above answers are "NO", then may proceed with Cephalosporin use.     Diagnostic Studies/Procedures    Heart cath from 01/01/18:  Prox LAD to Mid LAD lesion is 90% stenosed. LIMA to LAD is patent.  Distal LAD to Dist LAD lesion is 85% stenosed, past the insertion of the LIMA.  Ost 1st Diag lesion is 80% stenosed. 1st Diag lesion is 90% stenosed. Lat 1st Diag lesion is 90% stenosed. SVG to diagonal is occluded.  Ost 1st Mrg to 1st Mrg lesion is 100% stenosed. SVG to OM is occluded.  Prox RCA lesion is 80% stenosed. Mid RCA to Dist RCA lesion is 70% stenosed. Dist  RCA lesion is 70% stenosed. SVG to PDA is widely patent and provides collaterals to the occluded OM.  The left ventricular systolic function is normal.  LV end diastolic pressure is normal.  The left ventricular ejection fraction is 55-65% by visual estimate.  There is no aortic valve stenosis.   Continue medical therapy.  The grafts to LAD and RCA are patent.    The SVGs to the OM and diagonal are occluded.  Both of these vessels were small in caliber.   Could consider CTO PCI attempt of the OM if she has refractory angina.  However, given how small in caliber the OM vessel is, there may be long term patency issues of a DES.   History of Present Illness     Pt is a 57 y.o. female w/ HTN, HLD, andCADs/p CABG in Jan 2019 and subsequent cath 12/2017 showing 2 occluded vein grafts presenting now with angina. Her angina has unfortunately not improved on medical therapy. After her hospitalization 1 month ago, the plan was to attempt CTO revascularization if medical therapy unsuccessful. Currently however she is not on maximal anti-anginal therapy.  She is under stress as her husband (alcoholic) has been in the hospital here at [facility] for three weeks and is now awaiting placement.   Hospital Course     Consultants: none  Chest pain, CAD s/p CABG 09/2017, cath 01/01/18 with occluded SVG to OM and diagonal, occluded distal LAD past insertion of LIMA Troponins were trended and peaked at 3.2  before declining. EKG with ST depression in V6 and I, not new and actually improved from prior EK gin April 2019.  Heparin was discontinued 02/09/18 and she has not had a recurrence of chest pain. She ambulated today without chest pain.   Isordil was switched to imdur and increased to 90 mg. Norvasc increased to 10 mg and lisinopril decreased to 5 mg.   Leukocytosis She presented with mild leukocytosis. She was afebrile and CXR and UA were without infection.   HTN Medications as above.  HLD 10/10/2017:  Cholesterol 137; HDL 38; LDL Cholesterol 70; Triglycerides 144; VLDL 29 Continue lipitor  DM  A1c 8.6% on 11/2017 Will defer to primary care for management, but recommend aggressive glucose control in the setting of CAD.    _____________  Discharge Vitals Blood pressure 138/75, pulse 74, temperature 98.3 F (36.8 C), temperature source Oral, resp. rate 18, height 5' 6"  (1.676 m), weight 163 lb 12.8 oz (74.3 kg), SpO2 98 %.  Filed Weights   02/08/18 1031 02/09/18 0532 02/10/18 0601  Weight: 165 lb 6.4 oz (75 kg) 164 lb 12.8 oz (74.8 kg) 163 lb 12.8 oz (74.3 kg)    Labs & Radiologic Studies    CBC Recent Labs    02/09/18 0644 02/10/18 0511  WBC 7.4 6.5  HGB 11.0* 11.1*  HCT 35.6* 35.9*  MCV 74.3* 75.1*  PLT 225 400   Basic Metabolic Panel Recent Labs    02/08/18 0003 02/08/18 0452  NA 138 139  K 3.8 3.9  CL 109 107  CO2 22 24  GLUCOSE 226* 179*  BUN 26* 22*  CREATININE 1.16* 0.96  CALCIUM 9.1 9.2   Liver Function Tests No results for input(s): AST, ALT, ALKPHOS, BILITOT, PROT, ALBUMIN in the last 72 hours. No results for input(s): LIPASE, AMYLASE in the last 72 hours. Cardiac Enzymes Recent Labs    02/08/18 0754 02/08/18 1318 02/08/18 1956  TROPONINI 1.98* 3.20* 2.71*   BNP Invalid input(s): POCBNP D-Dimer No results for input(s): DDIMER in the last 72 hours. Hemoglobin A1C No results for input(s): HGBA1C in the last 72 hours. Fasting Lipid Panel No results for input(s): CHOL, HDL, LDLCALC, TRIG, CHOLHDL, LDLDIRECT in the last 72 hours. Thyroid Function Tests No results for input(s): TSH, T4TOTAL, T3FREE, THYROIDAB in the last 72 hours.  Invalid input(s): FREET3 _____________  Dg Chest 2 View  Result Date: 02/08/2018 CLINICAL DATA:  Mid chest pain. EXAM: CHEST - 2 VIEW COMPARISON:  01/01/2018 FINDINGS: Post median sternotomy and CABG. Stable mild cardiomegaly and mediastinal contours. The lungs are clear. Pulmonary vasculature is normal. No  consolidation, pleural effusion, or pneumothorax. No acute osseous abnormalities are seen. IMPRESSION: Stable mild cardiomegaly post CABG.  No acute findings. Electronically Signed   By: Jeb Levering M.D.   On: 02/08/2018 01:05   Disposition   Pt is being discharged home today in good condition.  Follow-up Plans & Appointments    Follow-up Information    Erlene Quan, PA-C Follow up on 02/17/2018.   Specialties:  Cardiology, Radiology Why:  11:30 TCM for hospital follow up Contact information: [address] [place] [address] [phone]          Discharge Instructions    Amb Referral to Cardiac Rehabilitation   Complete by:  As directed    Diagnosis:  NSTEMI   Diet - low sodium heart healthy   Complete by:  As directed    Diet - low sodium heart healthy  Complete by:  As directed    Increase activity slowly   Complete by:  As directed    Increase activity slowly   Complete by:  As directed       Discharge Medications   Allergies as of 02/10/2018      Reactions   Januvia [sitagliptin] Nausea And Vomiting, Other (See Comments)   Pancreatitis (this was in Epic, but patient was unaware of this)   Kiwi Extract Hives   Shrimp [shellfish Allergy] Hives   Metformin And Related Diarrhea   Penicillins Other (See Comments)   From childhood: Has patient had a PCN reaction causing immediate rash, facial/tongue/throat swelling, SOB or lightheadedness with hypotension: Unk Has patient had a PCN reaction causing severe rash involving mucus membranes or skin necrosis: Unk Has patient had a PCN reaction that required hospitalization: Unk Has patient had a PCN reaction occurring within the last 10 years: No If all of the above answers are "NO", then may proceed with Cephalosporin use.      Medication List    TAKE these medications   amLODipine 10 MG tablet Commonly known as:  NORVASC Take 0.5 tablets (5 mg total) by mouth daily. What changed:   medication strength   aspirin 81 MG EC tablet Take 1 tablet (81 mg total) by mouth daily. Start taking on:  02/11/2018 What changed:    medication strength  how much to take   atorvastatin 80 MG tablet Commonly known as:  LIPITOR Take 1 tablet (80 mg total) by mouth daily at 6 PM.   buPROPion 150 MG 24 hr tablet Commonly known as:  WELLBUTRIN XL TAKE ONE TABLET BY MOUTH ONE TIME DAILY   EPINEPHrine 0.3 mg/0.3 mL Soaj injection Commonly known as:  EPIPEN 2-PAK Inject 0.3 mLs (0.3 mg total) into the muscle once as needed. What changed:  reasons to take this   FARXIGA 10 MG Tabs tablet Generic drug:  dapagliflozin propanediol TAKE ONE TABLET BY MOUTH ONE TIME DAILY   FISH OIL CONCENTRATE 300 MG Caps Take 1 capsule by mouth daily.   HYDROcodone-acetaminophen 5-325 MG tablet Commonly known as:  NORCO Take 1 tablet by mouth every 6 (six) hours as needed for moderate pain.   Insulin Glargine 100 UNIT/ML Solostar Pen Commonly known as:  LANTUS SOLOSTAR Inject 10 Units into the skin daily at 10 pm. Increase dose by 2u every 3 days until fasting a.m. cbgs <150 What changed:    how much to take  additional instructions   isosorbide mononitrate 30 MG 24 hr tablet Commonly known as:  IMDUR Take 3 tablets (90 mg total) by mouth daily. What changed:    medication strength  how much to take   lisinopril 5 MG tablet Commonly known as:  PRINIVIL,ZESTRIL Take 1 tablet (5 mg total) by mouth daily. What changed:    medication strength  how much to take   loratadine 10 MG tablet Commonly known as:  CLARITIN Take 1 tablet (10 mg total) by mouth daily. What changed:    when to take this  reasons to take this   metoprolol tartrate 25 MG tablet Commonly known as:  LOPRESSOR Take 1 tablet (25 mg total) by mouth 2 (two) times daily.   multivitamin with minerals Tabs tablet Take 1 tablet by mouth daily.   nitroGLYCERIN 0.4 MG SL tablet Commonly known as:   NITROSTAT Place 1 tablet (0.4 mg total) under the tongue every 5 (five) minutes as needed for chest pain.   oxyCODONE  5 MG immediate release tablet Commonly known as:  Oxy IR/ROXICODONE Take 1 tablet (5 mg total) by mouth every 8 (eight) hours as needed for severe pain.   Pen Needles 32G X 4 MM Misc 1 Units by Does not apply route daily.   sertraline 50 MG tablet Commonly known as:  ZOLOFT TAKE ONE TABLET BY MOUTH ONE TIME DAILY   TURMERIC PO Take 1 tablet by mouth daily.   varenicline 0.5 MG X 11 & 1 MG X 42 tablet Commonly known as:  CHANTIX STARTING MONTH PAK Take one 0.5 mg tablet by mouth once daily for 3 days, then increase to one 0.5 mg tablet twice daily for 4 days, then increase to one 1 mg tablet twice daily.   zolpidem 5 MG tablet Commonly known as:  AMBIEN Take 1 tablet (5 mg total) by mouth at bedtime as needed for sleep.        Aspirin prescribed at discharge?  Yes High Intensity Statin Prescribed? (Lipitor 40-48m or Crestor 20-416m: Yes Beta Blocker Prescribed? Yes For EF <40%, was ACEI/ARB Prescribed? Yes ADP Receptor Inhibitor Prescribed? (i.e. Plavix etc.-Includes Medically Managed Patients): No: no intervention For EF <40%, Aldosterone Inhibitor Prescribed? No:  Was EF assessed during THIS hospitalization? No:  Was Cardiac Rehab II ordered? (Included Medically managed Patients): Yes   Outstanding Labs/Studies   TOC 7 days  Duration of Discharge Encounter   Greater than 30 minutes including physician time.  Signed, AnJeromePAUtah/22/2019, 12:20 PM   Agree with note by AnFabian SharpA-C  Stable for discharge this morning.  The graft to the RCA and LIMA to the LAD are patent.  Her enzymes did peak with a troponin of about 3.  She is had no recurrent chest pain off of IV heparin.  She wishes to stop smoking and we have provided her a prescription for Chantix and Ambien for sleep.  She will see be seen back as an  outpatient.  JoLorretta HarpM.D., FAOwyheeFABayfront Health Port CharlotteFALaverta BaltimoreSHighland Meadows[address]SuEastportNC  [id number]/22/2019 12:48 PM

## 2018-02-10 NOTE — Progress Notes (Signed)
HR sustaining at 50-53 bpm.  Metoprolol not given at this time.  Will continue to monitor.  Jodell Cipro

## 2018-02-10 NOTE — Telephone Encounter (Signed)
New message    TOC  appt needed per Doreene Adas- PA, approved to use 72hr slot  Appointment scheduled for 02/17/18 @11 :30 with Kerin Ransom

## 2018-02-10 NOTE — Progress Notes (Signed)
Progress Note  Patient Name: Victoria Eaton Date of Encounter: 02/10/2018  Primary Cardiologist: Pixie Casino, MD   Subjective   She has not had a recurrence of chest pain since being in the ER. Heparin drip was discontinued yesterday..  She ruled in for non-STEMI.  She is ready for discharge this morning.  She is had no subsequent chest pain.  Inpatient Medications    Scheduled Meds: . amLODipine  10 mg Oral Daily  . aspirin EC  81 mg Oral Daily  . atorvastatin  80 mg Oral q1800  . buPROPion  150 mg Oral Daily  . canagliflozin  100 mg Oral QAC breakfast  . insulin aspart  0-15 Units Subcutaneous TID WC  . insulin aspart  0-5 Units Subcutaneous QHS  . insulin glargine  30 Units Subcutaneous QHS  . isosorbide mononitrate  90 mg Oral Daily  . lisinopril  5 mg Oral Daily  . metoprolol tartrate  25 mg Oral Q6H  . multivitamin with minerals  1 tablet Oral Daily  . sertraline  50 mg Oral Daily   Continuous Infusions:  PRN Meds: HYDROcodone-acetaminophen, nitroGLYCERIN, ondansetron (ZOFRAN) IV   Vital Signs    Vitals:   02/09/18 2136 02/10/18 0027 02/10/18 0601 02/10/18 0650  BP: 126/66  138/75   Pulse: 64 (!) 53 67 74  Resp: 16  18   Temp: 98.5 F (36.9 C)  98.3 F (36.8 C)   TempSrc: Oral  Oral   SpO2: 96%  98%   Weight:   163 lb 12.8 oz (74.3 kg)   Height:        Intake/Output Summary (Last 24 hours) at 02/10/2018 1046 Last data filed at 02/10/2018 0700 Gross per 24 hour  Intake 1200 ml  Output -  Net 1200 ml   Filed Weights   02/08/18 1031 02/09/18 0532 02/10/18 0601  Weight: 165 lb 6.4 oz (75 kg) 164 lb 12.8 oz (74.8 kg) 163 lb 12.8 oz (74.3 kg)    Telemetry    Sinus  - Personally Reviewed  ECG    Not performed today- Personally Reviewed  Physical Exam   GEN: No acute distress.   Neck: No JVD Cardiac: RRR, no murmurs, rubs, or gallops.  Respiratory: Clear to auscultation bilaterally. GI: Soft, nontender, non-distended  MS: No edema; No  deformity. Neuro:  Nonfocal  Psych: Normal affect   No change since yesterday  Labs    Chemistry Recent Labs  Lab 02/08/18 0003 02/08/18 0452  NA 138 139  K 3.8 3.9  CL 109 107  CO2 22 24  GLUCOSE 226* 179*  BUN 26* 22*  CREATININE 1.16* 0.96  CALCIUM 9.1 9.2  GFRNONAA 50* >60  GFRAA 59* >60  ANIONGAP 7 8     Hematology Recent Labs  Lab 02/08/18 0003 02/09/18 0644 02/10/18 0511  WBC 11.2* 7.4 6.5  RBC 4.56 4.79 4.78  HGB 10.9* 11.0* 11.1*  HCT 34.2* 35.6* 35.9*  MCV 75.0* 74.3* 75.1*  MCH 23.9* 23.0* 23.2*  MCHC 31.9 30.9 30.9  RDW 18.4* 18.3* 18.3*  PLT 217 225 198    Cardiac Enzymes Recent Labs  Lab 02/08/18 0754 02/08/18 1318 02/08/18 1956  TROPONINI 1.98* 3.20* 2.71*    Recent Labs  Lab 02/08/18 0014  TROPIPOC 0.00     BNPNo results for input(s): BNP, PROBNP in the last 168 hours.   DDimer No results for input(s): DDIMER in the last 168 hours.   Radiology    No results found.  Cardiac Studies   Heart cath 01/01/18:  Prox LAD to Mid LAD lesion is 90% stenosed. LIMA to LAD is patent.  Distal LAD to Dist LAD lesion is 85% stenosed, past the insertion of the LIMA.  Ost 1st Diag lesion is 80% stenosed. 1st Diag lesion is 90% stenosed. Lat 1st Diag lesion is 90% stenosed. SVG to diagonal is occluded.  Ost 1st Mrg to 1st Mrg lesion is 100% stenosed. SVG to OM is occluded.  Prox RCA lesion is 80% stenosed. Mid RCA to Dist RCA lesion is 70% stenosed. Dist RCA lesion is 70% stenosed. SVG to PDA is widely patent and provides collaterals to the occluded OM.  The left ventricular systolic function is normal.  LV end diastolic pressure is normal.  The left ventricular ejection fraction is 55-65% by visual estimate.  There is no aortic valve stenosis.   Continue medical therapy.  The grafts to LAD and RCA are patent.    The SVGs to the OM and diagonal are occluded.  Both of these vessels were small in caliber.   Could consider CTO PCI  attempt of the OM if she has refractory angina.  However, given how small in caliber the OM vessel is, there may be long term patency issues of a DES.   Patient Profile     57 y.o. female w/ HTN, HLD, and CAD s/p CABG in Jan 2019 and subsequent cath showing 2 occluded vein grafts presenting now with crescendo angina. Her angina has unfortunately not improved on medical therapy. After her hospitalization 1 month ago, the plan was to attempt CTO revascularization if medical therapy unsuccessful. Currently however she is not on maximal anti-anginal therapy.   I increased her imdur 2 days ago. She is under stress as her husband (alcoholic) has been in the hospital here at [facility] for three weeks and is now awaiting placement.  Assessment & Plan    1. Chest pain, CAD s/p CABG 09/2017, cath 01/01/18 with occluded SVG to OM and diagonal, occluded distal LAD past insertion of LIMA - troponin positive peaking at 3.2 and then declining.  Her heparin was discontinued yesterday.  She is had no recurrent chest pain.  She did ambulate yesterday as well. - EKG with ST depression in V6 and I, not new and actually improved from EKG in April 2019 - pt has been chest pain free since being in the ER - heparin drip running, has not required nitro . - I switched her isordil TID to imdur 90 mg (was on 60 mg at home)  - I decreased her lisinopril to 5 mg and increased her norvasc to 10 mg    2. Leukocytosis - WBC 11.2--->7.4 - CXR without infectious process - will collect UA - patient is afebrile   3.  DM - A1c 8.6% 11/2017 - SSI   4. HTN - on norvasc, isordil, lopressor, lisinopril - pressures have been mildly elevated - has not received any anti-anginals yet this morning   5. HLD - 10/10/2017: Cholesterol 137; HDL 38; LDL Cholesterol 70; Triglycerides 144; VLDL 29 - continue lipitor  Surprisingly her enzymes were positive with a peak of 3.2.  She had no recurrent chest pain.  We discontinued her heparin  yesterday.  She is had no recurrent chest pain.  She does wish to stop smoking.  She stable for discharge today.  We will arrange outpatient follow-up with Dr. Debara Pickett.  She requests a prescription for Chantix which we will prescribe as well as  Ambien for sleep.  For questions or updates, please contact [place] Please consult [email or web address] for contact info under Cardiology/STEMI.      Signed, Quay Burow, MD  02/10/2018, 10:46 AM    10:46 AM

## 2018-02-11 ENCOUNTER — Telehealth: Payer: Self-pay | Admitting: Family Medicine

## 2018-02-11 NOTE — Telephone Encounter (Signed)
Copied from [place] [id number]. Topic: Quick Communication - See Telephone Encounter >> Feb 11, 2018 12:24 PM Aurelio Brash B wrote: CRM for notification. See Telephone encounter for: 02/11/18. Mary from [employer] needs to know maximine dosgae on pts LANTUS SOLOSTAR 100 UNIT/ML Solostar Pen  [facility] [address] - [place], [place] - [address] [phone] (Phone) [phone] (Fax)

## 2018-02-11 NOTE — Telephone Encounter (Signed)
Patient contacted regarding discharge from [place] on 02/10/18  Patient understands to follow up with provider kilroy On 02/17/18 at 11:30 am at [facility] Patient understands discharge instructions? yes Patient understands medications and regiment? yes Patient understands to bring all medications to this visit? yes

## 2018-02-11 NOTE — Telephone Encounter (Signed)
Lantus refill Last OV: 01/07/18 Last Refill:12/07/17 5 pens No RF Pharmacy:[facility] [address] [place] PCP: [name] MD Hgb A1C: 8.6

## 2018-02-12 NOTE — Telephone Encounter (Signed)
Please advise 

## 2018-02-14 ENCOUNTER — Other Ambulatory Visit: Payer: Self-pay | Admitting: Physician Assistant

## 2018-02-14 DIAGNOSIS — E119 Type 2 diabetes mellitus without complications: Secondary | ICD-10-CM

## 2018-02-16 ENCOUNTER — Telehealth: Payer: Self-pay | Admitting: *Deleted

## 2018-02-17 ENCOUNTER — Ambulatory Visit (INDEPENDENT_AMBULATORY_CARE_PROVIDER_SITE_OTHER): Payer: BLUE CROSS/BLUE SHIELD | Admitting: Cardiology

## 2018-02-17 ENCOUNTER — Encounter: Payer: Self-pay | Admitting: Cardiology

## 2018-02-17 DIAGNOSIS — E785 Hyperlipidemia, unspecified: Secondary | ICD-10-CM | POA: Diagnosis not present

## 2018-02-17 DIAGNOSIS — I10 Essential (primary) hypertension: Secondary | ICD-10-CM | POA: Diagnosis not present

## 2018-02-17 DIAGNOSIS — Z794 Long term (current) use of insulin: Secondary | ICD-10-CM | POA: Diagnosis not present

## 2018-02-17 DIAGNOSIS — E118 Type 2 diabetes mellitus with unspecified complications: Secondary | ICD-10-CM

## 2018-02-17 DIAGNOSIS — Z951 Presence of aortocoronary bypass graft: Secondary | ICD-10-CM

## 2018-02-17 DIAGNOSIS — I214 Non-ST elevation (NSTEMI) myocardial infarction: Secondary | ICD-10-CM | POA: Diagnosis not present

## 2018-02-17 MED ORDER — LISINOPRIL 5 MG PO TABS: 5.0000 mg | ORAL_TABLET | Freq: Every day | ORAL | 3 refills | Status: DC

## 2018-02-17 MED ORDER — RANOLAZINE ER 500 MG PO TB12: 500.0000 mg | ORAL_TABLET | Freq: Two times a day (BID) | ORAL | 3 refills | Status: DC

## 2018-02-17 NOTE — Assessment & Plan Note (Signed)
On statin Rx

## 2018-02-17 NOTE — Telephone Encounter (Signed)
30u for now

## 2018-02-17 NOTE — Telephone Encounter (Signed)
Please refill/advise  for FARXIGA Pt was to return on 02/04/18 for f/u  DM changes pt cancelled and no showed Pt has appt on 03/02/18

## 2018-02-17 NOTE — Assessment & Plan Note (Signed)
Type 2 IDDM with CAD

## 2018-02-17 NOTE — Assessment & Plan Note (Signed)
S/P NSTEMI 02/08/18- Troponin 3.2- medical Rx

## 2018-02-17 NOTE — Assessment & Plan Note (Signed)
Controlled.  

## 2018-02-17 NOTE — Progress Notes (Signed)
02/17/2018 Victoria Eaton   Mar 11, 1961  [id number]  Primary Physician Shawnee Knapp, MD Primary Cardiologist: Dr Debara Pickett  HPI:  57 y/o female with CAD s/p remote PCI to her RCA in [place] in 2006 (no details) and IDDM, presented in Jan 2019 with abdominal pain and ruled in for a NSTEMI. Cath done then revealed extensive CAD. She underwent CABG x 4 by Dr Servando Snare 10/19/17. She was readmitted in April with chest pain and ruled in for a NSTEMI. Cath done showed occlusion of the grafts to the OM and Dx. The plan is for medical Rx. She presented again May 20th 2019 with chest pain and again ruled in for a NSTEMI- Troponin 3.2. She was not cathed. She is in the office today for follow up. She  is understandably upset about the fact that she lost 2/4 grafts. "I'm afraid to do anything". She says she gets chest pain when she exerts herself, such as washing dishes. I explained that IDDM is an aggressive disease and certainly has played a part in her early progression. I explained that we plan to treat her with aggressive medical therapy and we expect over time she will be able to resume a reasonable level of activity.    Current Outpatient Medications  Medication Sig Dispense Refill  . amLODipine (NORVASC) 10 MG tablet Take 0.5 tablets (5 mg total) by mouth daily. 90 tablet 3  . aspirin 81 MG EC tablet Take 1 tablet (81 mg total) by mouth daily. 90 tablet 3  . atorvastatin (LIPITOR) 80 MG tablet Take 1 tablet (80 mg total) by mouth daily at 6 PM. 90 tablet 1  . buPROPion (WELLBUTRIN XL) 150 MG 24 hr tablet TAKE ONE TABLET BY MOUTH ONE TIME DAILY 90 tablet 0  . Continuous Blood Gluc Sensor (FREESTYLE LIBRE 14 DAY SENSOR) MISC 2 Devices by Does not apply route every morning. 2 each 0  . EPINEPHrine (EPIPEN 2-PAK) 0.3 mg/0.3 mL IJ SOAJ injection Inject 0.3 mLs (0.3 mg total) into the muscle once as needed. (Patient taking differently: Inject 0.3 mg into the muscle once as needed (for an allergic reaction). ) 1 Device  1  . FARXIGA 10 MG TABS tablet TAKE ONE TABLET BY MOUTH ONE TIME DAILY 90 tablet 0  . HYDROcodone-acetaminophen (NORCO) 5-325 MG tablet Take 1 tablet by mouth every 6 (six) hours as needed for moderate pain. 12 tablet 0  . Insulin Pen Needle (PEN NEEDLES) 32G X 4 MM MISC 1 Units by Does not apply route daily. 100 each 11  . isosorbide mononitrate (IMDUR) 30 MG 24 hr tablet Take 3 tablets (90 mg total) by mouth daily. 90 tablet 11  . LANTUS SOLOSTAR 100 UNIT/ML Solostar Pen INJECT 10 UNITS UNDER THE SKIN DAILY AT 10PM. INCREASE DOSE BY 2 UNITS EVERY 3 DAYS UNTIL FASTING MORNING BLOOD SUGAR IS LESS THAN 150. 15 pen 0  . lisinopril (PRINIVIL,ZESTRIL) 5 MG tablet Take 1 tablet (5 mg total) by mouth at bedtime. 90 tablet 3  . loratadine (CLARITIN) 10 MG tablet Take 1 tablet (10 mg total) by mouth daily. (Patient taking differently: Take 10 mg by mouth daily as needed for allergies. ) 90 tablet 3  . metoprolol tartrate (LOPRESSOR) 25 MG tablet Take 1 tablet (25 mg total) by mouth 2 (two) times daily. 180 tablet 1  . Multiple Vitamin (MULTIVITAMIN WITH MINERALS) TABS tablet Take 1 tablet by mouth daily.    . nitroGLYCERIN (NITROSTAT) 0.4 MG SL tablet Place 1 tablet (  0.4 mg total) under the tongue every 5 (five) minutes as needed for chest pain. 25 tablet 3  . Omega-3 Fatty Acids (FISH OIL CONCENTRATE) 300 MG CAPS Take 1 capsule by mouth daily.    [name] oxyCODONE (OXY IR/ROXICODONE) 5 MG immediate release tablet Take 1 tablet (5 mg total) by mouth every 8 (eight) hours as needed for severe pain. 15 tablet 0  . sertraline (ZOLOFT) 50 MG tablet TAKE ONE TABLET BY MOUTH ONE TIME DAILY 30 tablet 3  . TURMERIC PO Take 1 tablet by mouth daily.    . varenicline (CHANTIX STARTING MONTH PAK) 0.5 MG X 11 & 1 MG X 42 tablet Take one 0.5 mg tablet by mouth once daily for 3 days, then increase to one 0.5 mg tablet twice daily for 4 days, then increase to one 1 mg tablet twice daily. 53 tablet 0  . zolpidem (AMBIEN) 5 MG  tablet Take 1 tablet (5 mg total) by mouth at bedtime as needed for sleep. 30 tablet 0  . ranolazine (RANEXA) 500 MG 12 hr tablet Take 1 tablet (500 mg total) by mouth 2 (two) times daily. 90 tablet 3   No current facility-administered medications for this visit.     Allergies  Allergen Reactions  . Januvia [Sitagliptin] Nausea And Vomiting and Other (See Comments)    Pancreatitis (this was in Epic, but patient was unaware of this)   . Kiwi Extract Hives  . Shrimp [Shellfish Allergy] Hives  . Metformin And Related Diarrhea  . Penicillins Other (See Comments)    From childhood: Has patient had a PCN reaction causing immediate rash, facial/tongue/throat swelling, SOB or lightheadedness with hypotension: Unk Has patient had a PCN reaction causing severe rash involving mucus membranes or skin necrosis: Unk Has patient had a PCN reaction that required hospitalization: Unk Has patient had a PCN reaction occurring within the last 10 years: No If all of the above answers are "NO", then may proceed with Cephalosporin use.     Past Medical History:  Diagnosis Date  . Anxiety   . Arthritis    "maybe in my left foot" (10/14/2017)  . Bladder prolapse, female, acquired 09/14/2017  . Depression   . High cholesterol   . History of stomach ulcers   . Hypertension   . Myocardial infarction ([place]) 12/30/2004  . Type II diabetes mellitus ([place])     Social History   Socioeconomic History  . Marital status: Married    Spouse name: Glendell Docker  . Number of children: 1  . Years of education: Not on file  . Highest education level: Not on file  Occupational History  . Not on file  Social Needs  . Financial resource strain: Not on file  . Food insecurity:    Worry: Not on file    Inability: Not on file  . Transportation needs:    Medical: Not on file    Non-medical: Not on file  Tobacco Use  . Smoking status: Former Smoker    Packs/day: 0.12    Years: 45.00    Pack years: 5.40    Types:  Cigarettes    Start date: 05/01/1972    Last attempt to quit: 09/30/2017    Years since quitting: 0.3  . Smokeless tobacco: Never Used  Substance and Sexual Activity  . Alcohol use: Yes    Alcohol/week: 8.4 oz    Types: 14 Glasses of wine per week  . Drug use: No  . Sexual activity: Not Currently  Lifestyle  . Physical activity:    Days per week: Not on file    Minutes per session: Not on file  . Stress: Not on file  Relationships  . Social connections:    Talks on phone: Not on file    Gets together: Not on file    Attends religious service: Not on file    Active member of club or organization: Not on file    Attends meetings of clubs or organizations: Not on file    Relationship status: Not on file  . Intimate partner violence:    Fear of current or ex partner: Not on file    Emotionally abused: Not on file    Physically abused: Not on file    Forced sexual activity: Not on file  Other Topics Concern  . Not on file  Social History Narrative  . Not on file     Family History  Problem Relation Age of Onset  . Heart disease Father   . Hyperlipidemia Father   . Hypertension Father      Review of Systems: General: negative for chills, fever, night sweats or weight changes.  Cardiovascular: negative for edema, orthopnea, palpitations, paroxysmal nocturnal dyspnea or shortness of breath Dermatological: negative for rash Respiratory: negative for cough or wheezing Urologic: negative for hematuria Abdominal: negative for nausea, vomiting, diarrhea, bright red blood per rectum, melena, or hematemesis Neurologic: negative for visual changes, syncope, or dizziness All other systems reviewed and are otherwise negative except as noted above.    Blood pressure 112/62, pulse 85, height 5' 6"  (1.676 m), weight 170 lb 6.4 oz (77.3 kg).  General appearance: alert, cooperative and no distress Neck: no carotid bruit and no JVD Lungs: clear to auscultation bilaterally Heart:  regular rate and rhythm Extremities: extremities normal, atraumatic, no cyanosis or edema Skin: Skin color, texture, turgor normal. No rashes or lesions Neurologic: Grossly normal   ASSESSMENT AND PLAN:   Hx of CABG CABG x 25 Sep 2017- occlusion of SVG-OM and SVG-Dx in April 2019- Patent LIMA-LAD with distal LAD disease and patent SVG-PDA.- plan is for medical Rx  Non-ST elevation (NSTEMI) myocardial infarction ([place]) S/P NSTEMI 02/08/18- Troponin 3.2- medical Rx  Type 2 diabetes mellitus with complication, with long-term current use of insulin (HCC) Type 2 IDDM with CAD  Essential hypertension Controlled  Hyperlipidemia LDL goal <70 On statin Rx   PLAN  Add Ranexa 500 mg BID. F/U with Dr Debara Pickett as scheduled. No strenuous activity x 2 weeks, then ease back into swimming and yard work.   Kerin Ransom PA-C 02/17/2018 1:20 PM

## 2018-02-17 NOTE — Assessment & Plan Note (Signed)
CABG x 25 Sep 2017- occlusion of SVG-OM and SVG-Dx in April 2019- Patent LIMA-LAD with distal LAD disease and patent SVG-PDA.- plan is for medical Rx

## 2018-02-17 NOTE — Patient Instructions (Signed)
Medication Instructions: Start: Ranexa 500 mg two times a day  Take: Lisinopril 5 mg daily at bedtime  If you need a refill on your cardiac medications before your next appointment, please call your pharmacy.     Follow-Up: Your physician wants you to Keep scheduled appointment with Dr. Debara Pickett.   Special Instructions:    Thank you for choosing [facility] at [facility]!!

## 2018-02-17 NOTE — Telephone Encounter (Signed)
Phone call to [facility] pharmacy, message from provider relayed.

## 2018-02-18 ENCOUNTER — Ambulatory Visit: Payer: Medicaid Other | Admitting: Cardiology

## 2018-02-19 ENCOUNTER — Telehealth: Payer: Self-pay | Admitting: *Deleted

## 2018-02-22 ENCOUNTER — Telehealth: Payer: Self-pay | Admitting: Family Medicine

## 2018-02-22 NOTE — Telephone Encounter (Signed)
Prior auth for Farxiga 53m tablets initiated via CoverMyMeds: Key:Tacy Learn- Rx #:: [id number]  [facility]- it is documented she has been stable on Farxiga, but no information available on when she started taking medication.   Phone call to patient to ask when FIranstarted- unable to reach, unable to leave message due to voicemail being full. CRM entered.

## 2018-02-25 ENCOUNTER — Encounter: Payer: BLUE CROSS/BLUE SHIELD | Admitting: Cardiothoracic Surgery

## 2018-02-28 ENCOUNTER — Other Ambulatory Visit: Payer: Self-pay | Admitting: Physician Assistant

## 2018-02-28 DIAGNOSIS — E119 Type 2 diabetes mellitus without complications: Secondary | ICD-10-CM

## 2018-03-01 ENCOUNTER — Telehealth (HOSPITAL_COMMUNITY): Payer: Self-pay

## 2018-03-01 NOTE — Telephone Encounter (Signed)
Patients insurance is active and benefits verified through [employer] - No co-pay, deductible amount of $6,750/$6,750 has been met, out of pocket amount of $6,750/$6,750 has been met, no co-insurance, and no pre-authorization is required. Reference #[id number]

## 2018-03-02 ENCOUNTER — Encounter: Payer: Self-pay | Admitting: Family Medicine

## 2018-03-02 ENCOUNTER — Ambulatory Visit (INDEPENDENT_AMBULATORY_CARE_PROVIDER_SITE_OTHER): Payer: BLUE CROSS/BLUE SHIELD | Admitting: Family Medicine

## 2018-03-02 VITALS — BP 126/62 | HR 81 | Temp 98.5°F | Ht 66.0 in | Wt 177.4 lb

## 2018-03-02 DIAGNOSIS — E118 Type 2 diabetes mellitus with unspecified complications: Secondary | ICD-10-CM | POA: Diagnosis not present

## 2018-03-02 DIAGNOSIS — I200 Unstable angina: Secondary | ICD-10-CM

## 2018-03-02 DIAGNOSIS — Z720 Tobacco use: Secondary | ICD-10-CM

## 2018-03-02 DIAGNOSIS — G4700 Insomnia, unspecified: Secondary | ICD-10-CM

## 2018-03-02 DIAGNOSIS — F419 Anxiety disorder, unspecified: Secondary | ICD-10-CM | POA: Diagnosis not present

## 2018-03-02 DIAGNOSIS — F329 Major depressive disorder, single episode, unspecified: Secondary | ICD-10-CM

## 2018-03-02 DIAGNOSIS — Z794 Long term (current) use of insulin: Secondary | ICD-10-CM | POA: Diagnosis not present

## 2018-03-02 DIAGNOSIS — I214 Non-ST elevation (NSTEMI) myocardial infarction: Secondary | ICD-10-CM | POA: Diagnosis not present

## 2018-03-02 DIAGNOSIS — E785 Hyperlipidemia, unspecified: Secondary | ICD-10-CM

## 2018-03-02 DIAGNOSIS — F32A Depression, unspecified: Secondary | ICD-10-CM

## 2018-03-02 MED ORDER — INSULIN ASPART 100 UNIT/ML FLEXPEN: PEN_INJECTOR | SUBCUTANEOUS | 1 refills | Status: DC

## 2018-03-02 MED ORDER — INSULIN GLARGINE 100 UNIT/ML SOLOSTAR PEN: 35.0000 [IU] | PEN_INJECTOR | Freq: Every day | SUBCUTANEOUS | 1 refills | Status: DC

## 2018-03-02 MED ORDER — PEN NEEDLES 32G X 4 MM MISC: 1.0000 [IU] | Freq: Every day | 11 refills | Status: DC

## 2018-03-02 NOTE — Progress Notes (Addendum)
Subjective:  By signing my name below, I, Victoria Eaton, attest that this documentation has been prepared under the direction and in the presence of [name], MD Electronically Signed: Ladene Victoria, ED Scribe 03/02/2018 at 4:53 PM.   Patient ID: Victoria Eaton, female    DOB: 12-May-1961, 57 y.o.   MRN: [id number]  Chief Complaint  Patient presents with  . Follow-up    pt states she is doing ok  . Diabetes  . Depression    score:16   HPI Victoria Eaton is a 57 y.o. female who presents to Primary Care at [facility] for f/u. Last seen 2 months prior. She was recovering from MI which she found out 2 prior CABG grafts had failed. Since visit, she was hospitalized again with unstable angina and NSTEMI 1 month ago after 4 vessel CABG in 09/2017. Started on insulin in March with Lantus 10 units as CBGs uncontrolled on oral therapy alone. Pancreatitis 1 month after starting Januiva, couldn't tolerate Metformin. Wilder Glade continued. Last OV 2 months ago, Lantus was increased to 20 units and pt was encouraged to continue upward titration to reach goal of fasting AM CBGs less than 150.  Cp Pt states that she still has occasional cp that resolves with Nitro. She has only taken 1. Starts cardiac rehab 3 days/wk in Aug.  DM Pt reports that CBGs have been elevated. States she gets good morning fasting readings but they spike towards the middle of the day until around 9:30 PM. She wakes around 5:30 AM and eats breakfast (fruit and cottage cheese) around 7:30 AM, has a snack around 10 AM, lunch (sandwich with fruit), another snack and dinner (salad and meat) around 8 PM. Denies hypoglycemic lows, dizziness, lightheadedness, any other symptoms. She tried Lantus 32 for the first time last night. Pt is not currently using a meal-time insulin.  GI States she has gone from constant diarrhea to 2-3 episodes of diarrhea since Jan. Attributes symptoms to increased stress at that time.  Bilateral Shoulder Pain Pt  reports constant bilateral shoulder pain that she describes as an aching sensation as well as "cracking". She has had injections in her knees but not her shoulders.  Smoking Cessation Pt has Chantix but hasn't started it. States she's not in a good place yet as her husband is currently in a memory care unit due to alcoholism but he is getting better.  Sleep Disturbances  Denies difficulty falling asleep; states she just wakes in the middle of the night around 5:30 AM. She has taken Ambien once.  Depression Pt does not want to increase Zoloft at this time. States "Im not a [employer], trade [employer]". Depression screen [facility] 2/9 03/02/2018 01/07/2018 12/16/2017 12/07/2017 11/25/2017  Decreased Interest 3 0 1 3 0  Down, Depressed, Hopeless 3 0 1 3 1   PHQ - 2 Score 6 0 2 6 1   Altered sleeping 0 - 0 1 0  Tired, decreased energy 2 - 2 3 3   Change in appetite 3 - 1 2 0  Feeling bad or failure about yourself  2 - 2 2 0  Trouble concentrating 3 - 2 3 2   Moving slowly or fidgety/restless 0 - 0 3 0  Suicidal thoughts 0 - 0 0 0  PHQ-9 Score 16 - 9 20 6   Difficult doing work/chores Very difficult - Somewhat difficult Somewhat difficult -    Past Medical History:  Diagnosis Date  . Anxiety   . Arthritis    "maybe in my left foot" (10/14/2017)  .  Bladder prolapse, female, acquired 09/14/2017  . Depression   . High cholesterol   . History of stomach ulcers   . Hypertension   . Myocardial infarction ([place]) 12/30/2004  . Type II diabetes mellitus ([place])    Current Outpatient Medications on File Prior to Visit  Medication Sig Dispense Refill  . amLODipine (NORVASC) 10 MG tablet Take 0.5 tablets (5 mg total) by mouth daily. 90 tablet 3  . aspirin 81 MG EC tablet Take 1 tablet (81 mg total) by mouth daily. 90 tablet 3  . atorvastatin (LIPITOR) 80 MG tablet Take 1 tablet (80 mg total) by mouth daily at 6 PM. 90 tablet 1  . buPROPion (WELLBUTRIN XL) 150 MG 24 hr tablet TAKE ONE TABLET BY MOUTH ONE TIME DAILY 90 tablet 0  .  Continuous Blood Gluc Sensor (FREESTYLE LIBRE 14 DAY SENSOR) MISC 2 Devices by Does not apply route every morning. 2 each 0  . EPINEPHrine (EPIPEN 2-PAK) 0.3 mg/0.3 mL IJ SOAJ injection Inject 0.3 mLs (0.3 mg total) into the muscle once as needed. (Patient taking differently: Inject 0.3 mg into the muscle once as needed (for an allergic reaction). ) 1 Device 1  . FARXIGA 10 MG TABS tablet TAKE ONE TABLET BY MOUTH ONE TIME DAILY 90 tablet 0  . HYDROcodone-acetaminophen (NORCO) 5-325 MG tablet Take 1 tablet by mouth every 6 (six) hours as needed for moderate pain. 12 tablet 0  . Insulin Pen Needle (PEN NEEDLES) 32G X 4 MM MISC 1 Units by Does not apply route daily. 100 each 11  . isosorbide mononitrate (IMDUR) 30 MG 24 hr tablet Take 3 tablets (90 mg total) by mouth daily. 90 tablet 11  . LANTUS SOLOSTAR 100 UNIT/ML Solostar Pen INJECT 10 UNITS UNDER THE SKIN DAILY AT 10PM. INCREASE DOSE BY 2 UNITS EVERY 3 DAYS UNTIL FASTING MORNING BLOOD SUGAR IS LESS THAN 150. 15 pen 0  . lisinopril (PRINIVIL,ZESTRIL) 5 MG tablet Take 1 tablet (5 mg total) by mouth at bedtime. 90 tablet 3  . loratadine (CLARITIN) 10 MG tablet Take 1 tablet (10 mg total) by mouth daily. (Patient taking differently: Take 10 mg by mouth daily as needed for allergies. ) 90 tablet 3  . metoprolol tartrate (LOPRESSOR) 25 MG tablet Take 1 tablet (25 mg total) by mouth 2 (two) times daily. 180 tablet 1  . Multiple Vitamin (MULTIVITAMIN WITH MINERALS) TABS tablet Take 1 tablet by mouth daily.    . nitroGLYCERIN (NITROSTAT) 0.4 MG SL tablet Place 1 tablet (0.4 mg total) under the tongue every 5 (five) minutes as needed for chest pain. 25 tablet 3  . Omega-3 Fatty Acids (FISH OIL CONCENTRATE) 300 MG CAPS Take 1 capsule by mouth daily.    [name] oxyCODONE (OXY IR/ROXICODONE) 5 MG immediate release tablet Take 1 tablet (5 mg total) by mouth every 8 (eight) hours as needed for severe pain. 15 tablet 0  . ranolazine (RANEXA) 500 MG 12 hr tablet Take 1  tablet (500 mg total) by mouth 2 (two) times daily. 90 tablet 3  . sertraline (ZOLOFT) 50 MG tablet TAKE ONE TABLET BY MOUTH ONE TIME DAILY 30 tablet 3  . TURMERIC PO Take 1 tablet by mouth daily.    . varenicline (CHANTIX STARTING MONTH PAK) 0.5 MG X 11 & 1 MG X 42 tablet Take one 0.5 mg tablet by mouth once daily for 3 days, then increase to one 0.5 mg tablet twice daily for 4 days, then increase to one 1 mg  tablet twice daily. 53 tablet 0  . zolpidem (AMBIEN) 5 MG tablet Take 1 tablet (5 mg total) by mouth at bedtime as needed for sleep. 30 tablet 0   No current facility-administered medications on file prior to visit.    Past Surgical History:  Procedure Laterality Date  . CARDIAC CATHETERIZATION  10/14/2017  . CORONARY ANGIOPLASTY WITH STENT PLACEMENT  12/30/2004   Patient reported  . CORONARY ARTERY BYPASS GRAFT N/A 10/19/2017   Procedure: CORONARY ARTERY BYPASS GRAFTING (CABG) times four using the right saphaneous vein. Harvested endoscopicly and left internal mammary artery.;  Surgeon: Grace Isaac, MD;  Location: [place];  Service: Open Heart Surgery;  Laterality: N/A;  . DILATION AND CURETTAGE OF UTERUS  1980s  . LEFT HEART CATH AND CORONARY ANGIOGRAPHY N/A 10/14/2017   Procedure: LEFT HEART CATH AND CORONARY ANGIOGRAPHY;  Surgeon: Martinique, Peter M, MD;  Location: [place] [facility];  Service: Cardiovascular;  Laterality: N/A;  . LEFT HEART CATH AND CORS/GRAFTS ANGIOGRAPHY N/A 01/01/2018   Procedure: LEFT HEART CATH AND CORS/GRAFTS ANGIOGRAPHY;  Surgeon: Jettie Booze, MD;  Location: [place] [facility];  Service: Cardiovascular;  Laterality: N/A;  . PILONIDAL CYST EXCISION  1980s  . TEE WITHOUT CARDIOVERSION N/A 10/19/2017   Procedure: TRANSESOPHAGEAL ECHOCARDIOGRAM (TEE);  Surgeon: Grace Isaac, MD;  Location: [place];  Service: Open Heart Surgery;  Laterality: N/A;   Allergies  Allergen Reactions  . Januvia [Sitagliptin] Nausea And Vomiting and Other (See Comments)     Pancreatitis (this was in Epic, but patient was unaware of this)   . Kiwi Extract Hives  . Shrimp [Shellfish Allergy] Hives  . Metformin And Related Diarrhea  . Penicillins Other (See Comments)    From childhood: Has patient had a PCN reaction causing immediate rash, facial/tongue/throat swelling, SOB or lightheadedness with hypotension: Unk Has patient had a PCN reaction causing severe rash involving mucus membranes or skin necrosis: Unk Has patient had a PCN reaction that required hospitalization: Unk Has patient had a PCN reaction occurring within the last 10 years: No If all of the above answers are "NO", then may proceed with Cephalosporin use.    Family History  Problem Relation Age of Onset  . Heart disease Father   . Hyperlipidemia Father   . Hypertension Father    Social History   Socioeconomic History  . Marital status: Married    Spouse name: Glendell Docker  . Number of children: 1  . Years of education: Not on file  . Highest education level: Not on file  Occupational History  . Not on file  Social Needs  . Financial resource strain: Not on file  . Food insecurity:    Worry: Not on file    Inability: Not on file  . Transportation needs:    Medical: Not on file    Non-medical: Not on file  Tobacco Use  . Smoking status: Former Smoker    Packs/day: 0.12    Years: 45.00    Pack years: 5.40    Types: Cigarettes    Start date: 05/01/1972    Last attempt to quit: 09/30/2017    Years since quitting: 0.4  . Smokeless tobacco: Never Used  Substance and Sexual Activity  . Alcohol use: Yes    Alcohol/week: 8.4 oz    Types: 14 Glasses of wine per week  . Drug use: No  . Sexual activity: Not Currently  Lifestyle  . Physical activity:    Days per week: Not  on file    Minutes per session: Not on file  . Stress: Not on file  Relationships  . Social connections:    Talks on phone: Not on file    Gets together: Not on file    Attends religious service: Not on file     Active member of club or organization: Not on file    Attends meetings of clubs or organizations: Not on file    Relationship status: Not on file  Other Topics Concern  . Not on file  Social History Narrative  . Not on file   Depression screen [facility] 2/9 03/02/2018 01/07/2018 12/16/2017 12/07/2017 11/25/2017  Decreased Interest 3 0 1 3 0  Down, Depressed, Hopeless 3 0 1 3 1   PHQ - 2 Score 6 0 2 6 1   Altered sleeping 0 - 0 1 0  Tired, decreased energy 2 - 2 3 3   Change in appetite 3 - 1 2 0  Feeling bad or failure about yourself  2 - 2 2 0  Trouble concentrating 3 - 2 3 2   Moving slowly or fidgety/restless 0 - 0 3 0  Suicidal thoughts 0 - 0 0 0  PHQ-9 Score 16 - 9 20 6   Difficult doing work/chores Very difficult - Somewhat difficult Somewhat difficult -     Review of Systems  Cardiovascular: Positive for chest pain (occasional).  Gastrointestinal: Negative for diarrhea.  Musculoskeletal: Positive for arthralgias.  Neurological: Negative for dizziness and light-headedness.  Psychiatric/Behavioral: Positive for sleep disturbance.      Objective:   Physical Exam  Constitutional: She is oriented to person, place, and time. She appears well-developed and well-nourished. No distress.  HENT:  Head: Normocephalic and atraumatic.  Eyes: Conjunctivae and EOM are normal.  Neck: Neck supple. No tracheal deviation present.  Cardiovascular: Normal rate.  Pulmonary/Chest: Effort normal. No respiratory distress.  Musculoskeletal: Normal range of motion.  Neurological: She is alert and oriented to person, place, and time.  Skin: Skin is warm and dry.  Psychiatric: She has a normal mood and affect. Her behavior is normal.  Nursing note and vitals reviewed.   BP 126/62 (BP Location: Right Arm, Patient Position: Sitting, Cuff Size: Normal)   Pulse 81   Temp 98.5 F (36.9 C) (Oral)   Ht 5' 6"  (1.676 m)   Wt 177 lb 6.4 oz (80.5 kg)   SpO2 97%   BMI 28.63 kg/m     Assessment & Plan:   1.  Type 2 diabetes mellitus with complication, with long-term current use of insulin ([place]) - worked herself up to levemir 30u qhs but then went up to 32u qhs last night - brought in app on phone to reivew meter of freestyle libre so checking often ~6x/d - evening and a.m. Are lookinging pretty good - >50% are below 150 and some nearing lower 100s but during daytime getting to higher 100s-200s so will have pt increase levemir to 35u qhs and start mealtime rapid insulin - start with Novolog 10u with her "second" breakfast (more food ~10 am rather than coffee and fruit at first breakfast) and also w/ early lunch which is main meal of the day.  No need to use insulin with dinner at this time which is usually lighter salad. Gets stressed if cbgs high now but can't do anything about it since can't exercise - advise that normal for cbgs to spike 1-2 hrs after a meal but if elev >250 prior to meal, give (or add in an  additional) 5u Novolog though not before bed unless also eating. Consider endocrine referral - ?insulin pump? Maxed out on farxiga - cont.  2. Non-ST elevation (NSTEMI) myocardial infarction ([place])   3. Unstable angina ([facility]) - only having to use nitro ~2x/wk now and not having to use it > 1 times for CP relief now that on nitro  4. Hyperlipidemia LDL goal <70   5. Anxiety and depression - feels like she is doing better despite the repeat MI and nmerous stressors - aware that MI can cause depression and pts with uncontrolled/untreated mood sxs do clinically worse - but declines further trx or to consider therapy at this time.  6. Tobacco abuse - pt feels she is under to much stress to quit currently since she can't do any physical activitiy without developing angina so is restricted from taking care of things that bother her and husband currently in memory snf for dementia 2/2 EtOH abuse but improving and coming home soon - so unwilling to discuss cessation - consider wellbutrin when she is ready.  7.       Insomnia, unspecified type- still wakes sev times throughout the night - zolpidem 5 did not help at all - will double up and try 10  Meds ordered this encounter  Medications  . insulin aspart (NOVOLOG FLEXPEN) 100 UNIT/ML FlexPen    Sig: Inject 10u sq prior to meals and 5u for cbg >250    Dispense:  30 mL    Refill:  1  . Insulin Pen Needle (PEN NEEDLES) 32G X 4 MM MISC    Sig: 1 Units by Does not apply route daily.    Dispense:  400 each    Refill:  11  . Insulin Glargine (LANTUS SOLOSTAR) 100 UNIT/ML Solostar Pen    Sig: Inject 35 Units into the skin daily at 10 pm.    Dispense:  30 pen    Refill:  1    I personally performed the services described in this documentation, which was scribed in my presence. The recorded information has been reviewed and considered, and addended by me as needed.   [name], M.D.  Primary Care at [facility] [address] [place], [place] [address] [phone] phone [phone] fax  03/05/18 10:58 PM '

## 2018-03-02 NOTE — Patient Instructions (Addendum)
Try 11m of the zolpidem to see if it keeps you asleep through the night.  Let me know if you are interested in a referral to endocrinology to discuss the possibility of an insulin pump - always happy to place if you prefer.   Increase lantus to 35u at night. Start with 10u of Novolog prior to second breakfast and lunch. OK to give yourself 5u of Novolog for any cbg >250 unless you are not eating anything AND going to sleep within the next 2-3 hrs.  IF you received an x-ray today, you will receive an [facility] from [facility]. Please contact [facility] at [phone] questions or concerns regarding your invoice.   IF you received labwork today, you will receive an invoice from [facility] Please contact [facility] at [phone] questions or concerns regarding your invoice.   Our billing staff will not be able to assist you with questions regarding bills from these companies.  You will be contacted with the lab results as soon as they are available. The fastest way to get your results is to activate your My Chart account. Instructions are located on the last page of this paperwork. If you have not heard from [facility] the results in 2 weeks, please contact this office.      Correction Insulin Correction insulin, also called corrective insulin or a supplemental dose, is a small amount of insulin that can be used to lower your blood sugar (glucose) if it is too high. You may be instructed to check your blood glucose at certain times of the day and to use correction insulin as needed to lower your blood glucose to your target range. Correction insulin is primarily used as part of diabetes management. It may also be prescribed for people who do not have diabetes. What is a correction scale? A correction scale, also called a sliding scale, is prescribed by your health care provider to help you determine when you need correction insulin. Your correction scale is based  on your individual treatment goals, and it has two parts:  Ranges of blood glucose levels.  How much correction insulin to give yourself if your blood sugar falls within a certain range.  If your blood glucose is in your desired range, you will not need correction insulin and you should take your normal insulin dose. What type of insulin do I need? Your health care provider may prescribe rapid-acting or short-acting insulin for you to use as correction insulin. Rapid-acting insulin:  Starts working quickly, in as little as 5 minutes.  Can last for 3-6 hours.  Works well when taken right before a meal to quickly lower blood glucose. Short-acting insulin:  Starts working in about 30 minutes.  Can last for 6-8 hours.  Should be taken about 30 minutes before you start eating a meal. Talk with your health care provider or pharmacist about which type of correction insulin to take and when to take it. If you use insulin to control your diabetes, you should use correction insulin in addition to the longer-acting (basal) insulin that you normally use. How do I manage my blood glucose with correction insulin? Giving a correction dose  Check your blood glucose as directed by your health care provider.  Use your correction scale to find the range that your blood glucose is in.  Identify the units of insulin that match your blood glucose range.  Make sure you have food available that you can eat in the next 15-30 minutes, after  your correction dose.  Give yourself the dose of correction insulin that your health care provider has prescribed in your correction scale. Always make sure you are using the right type of insulin. ? If your correction insulin is rapid-acting, start eating a meal within 15 minutes after your correction dose to keep your blood glucose from getting too low. ? If your correction insulin is short-acting, start eating a meal within 30 minutes after your correction dose to  keep your blood glucose from getting too low. Keeping a blood glucose log  Write down your blood glucose test results and the amount of insulin that you give yourself. Do this every time you check blood glucose or take insulin. Bring this log with you to your medical visits. This information will help your health care provider to manage your medicines.  Note anything that may affect your blood glucose, such as: ? Changes in normal exercise or activity. ? Changes in your normal schedule, such as changes in your sleep routine, going on vacation, changing your diet, or holidays. ? New over-the-counter or prescription medicines. ? Illness, stress, or anxiety. ? Changes in the time that you took your medicine or insulin. ? Changes in your meals, such as skipping a meal, having a late meal, or dining out. ? Eating things that may affect blood glucose, such as snacks, meal portions that are larger than normal, drinks that contain sugar, or eating less than usual. What do I need to know about hyperglycemia and hypoglycemia? What is hyperglycemia? Hyperglycemia, also called high blood glucose, occurs when blood glucose is too high. Make sure you know the early signs of hyperglycemia, such as:  Increased thirst.  Hunger.  Feeling very tired.  Needing to urinate more often than usual.  Blurry vision.  What is hypoglycemia? Hypoglycemia is also called low blood glucose. Be aware of "stacking" your insulin doses. This happens when you correct a high blood glucose by giving yourself extra insulin too soon after a previous correction dose or mealtime dose. This may cause you to have too much insulin in your body and may put you at risk for hypoglycemia. Hypoglycemia occurs with a blood glucose level at or below 70 mg/dL (3.9 mmol/L). It is important to know the symptoms of hypoglycemia and treat it right away. Always have a 15-gram rapid-acting carbohydrate snack with you to treat low blood glucose.  Family members and close friends should also know the symptoms and should understand how to treat hypoglycemia, in case you are not able to treat yourself. What are the symptoms of hypoglycemia? Hypoglycemia symptoms can include:  Hunger.  Anxiety.  Sweating and feeling clammy.  Confusion.  Dizziness or light-headedness.  Sleepiness.  Nausea.  Increased heart rate.  Headache.  Blurry vision.  Jerky movements that you cannot control (seizure).  Nightmares.  Tingling or numbness around the mouth, lips, or tongue.  A change in speech.  Decreased ability to concentrate.  A change in coordination.  Restless sleep.  Tremors or shakes.  Fainting.  Irritability.  How do I treat hypoglycemia? If you are alert and able to swallow safely, follow the 15:15 rule:  Take 15 grams of a rapid-acting carbohydrate. Rapid-acting options include: ? 1 tube of glucose gel. ? 3 glucose pills. ? 6-8 pieces of hard candy. ? 4 oz (120 mL) of fruit juice. ? 4 oz (120 mL) of regular (not diet) soda.  Check your blood glucose 15 minutes after you take the carbohydrate. ? If the repeat  blood glucose level is still at or below 70 mg/dL (3.9 mmol/L), take 15 grams of a carbohydrate again. ? If your blood glucose level does not increase above 70 mg/dL (3.9 mmol/L) after 3 tries, seek emergency medical care.  After your blood glucose level returns to normal, eat a meal or a snack within 1 hour.  How do I treat severe hypoglycemia? Severe hypoglycemia is when your blood glucose level is at or below 54 mg/dL (3 mmol/L). Severe hypoglycemia is an emergency. Do not wait to see if the symptoms will go away. Get medical help right away. Call your local emergency services (911 in the [place] Do not drive yourself to the hospital. If you have severe hypoglycemia and you cannot eat or drink, you may need an injection of glucagon. A family member or close friend should learn how to check your blood  glucose and how to give you a glucagon injection. Ask your health care provider if you need to have an emergency glucagon injection kit available. Severe hypoglycemia may need to be treated in a hospital. The treatment may include getting glucose through an IV tube. You may also need treatment for the cause of your hypoglycemia. Why do I need correction insulin if I do not have diabetes? If you do not have diabetes, your health care provider may prescribe insulin because:  Keeping your blood glucose in the target range is important for your overall health.  You are taking medicines that cause your blood glucose to be higher than normal.  Contact a health care provider if:  You develop low blood glucose that you are not able to treat yourself.  You have high blood glucose that you are not able to correct with correction insulin.  Your blood glucose is often too low.  You used emergency glucagon to treat low blood glucose. Get help right away if:  You become unresponsive. If this happens, someone else should call emergency services (911 in the [place] right away.  Your blood glucose is lower than 54 mg/dL (3.0 mmol/L).  You become confused or you have trouble thinking clearly.  You have difficulty breathing. Summary  Correction insulin is a small amount of insulin that can be used to lower your blood sugar (glucose) if it is too high.  Talk with your health care provider or pharmacist about which type of correction insulin to take and when to take it. If you use insulin to control your diabetes, you should use correction insulin in addition to the longer-acting (basal) insulin that you normally use.  You may be instructed to check your blood glucose at certain times of the day and to use correction insulin as needed to lower your blood glucose to your target range. Always keep a log of your blood glucose values and the amount of insulin that you used.  It is important to know the  symptoms of hypoglycemia and treat it right away. Always have a 15-gram rapid-acting carbohydrate snack with you to treat low blood glucose. Family members and close friends should also know the symptoms and should understand how to treat hypoglycemia, in case you are not able to treat yourself. This information is not intended to replace advice given to you by your health care provider. Make sure you discuss any questions you have with your health care provider. Document Released: 01/30/2011 Document Revised: 06/06/2016 Document Reviewed: 06/06/2016 Elsevier Interactive Patient Education  [employer].  Here is an example of a sliding scale for insulin:  Diabetic Sliding Scale (I)  IF BLOOD SUGAR IS:   LESS THAN 100 ( NO INSULIN)   100-140 (2 UNITS)   141-180 (4 UNITS)   181-220 (6 UNITS)   221-260 (8 UNITS)   261-300 (10 UNITS)   301-340 (12 UNITS)   MORE THAN 341 UNITS (14 UNITS)

## 2018-03-04 ENCOUNTER — Other Ambulatory Visit: Payer: Self-pay

## 2018-03-04 ENCOUNTER — Encounter: Payer: Self-pay | Admitting: Cardiothoracic Surgery

## 2018-03-04 ENCOUNTER — Ambulatory Visit (INDEPENDENT_AMBULATORY_CARE_PROVIDER_SITE_OTHER): Payer: BLUE CROSS/BLUE SHIELD | Admitting: Cardiothoracic Surgery

## 2018-03-04 ENCOUNTER — Telehealth: Payer: Self-pay | Admitting: Family Medicine

## 2018-03-04 VITALS — BP 106/61 | HR 76 | Resp 16 | Ht 66.0 in | Wt 177.0 lb

## 2018-03-04 DIAGNOSIS — Z951 Presence of aortocoronary bypass graft: Secondary | ICD-10-CM

## 2018-03-04 DIAGNOSIS — I2510 Atherosclerotic heart disease of native coronary artery without angina pectoris: Secondary | ICD-10-CM

## 2018-03-04 NOTE — Telephone Encounter (Signed)
Orlando Penner w/[facility] [phone] received notice that the insurance has denied the prior authorization for Farxiga 46m.  She would like to know if the provider would like to try something else.

## 2018-03-04 NOTE — Telephone Encounter (Signed)
Message re: Wilder Glade denied by insurance - ? Try another med - sent to Dr. Brigitte Pulse

## 2018-03-04 NOTE — Telephone Encounter (Signed)
Copied from [place] [id number]. Topic: Quick Communication - Office Called Patient >> Feb 22, 2018  6:14 PM Delle Reining, RN wrote: Reason for CRM:  Prior auth for Farxiga 10m tablets initiated via CoverMyMeds: Key:Tacy Learn- Rx #: [id number]  [facility]- it is documented she has been stable on Farxiga, but no information available on when she started taking medication.   Phone call to patient to ask when FIranstarted- unable to reach, unable to leave message due to voicemail being full. CRM entered

## 2018-03-04 NOTE — Progress Notes (Signed)
[address]       [address],[place] [address]             [phone]      Manami Musleh [place] Medical Record #[id number] Date of Birth: 08-01-1961  Referring: Martinique, Peter M, MD Primary Care: Shawnee Knapp, MD Primary Cardiologist: Pixie Casino, MD   Chief Complaint:   POST OP FOLLOW UP 10/19/2017 OPERATIVE REPORT PreopOPERATIVE DIAGNOSIS:  Severe 3-vessel coronary artery disease with admission for acute gallstone pancreatitis and mildly elevated troponin. POSTOPERATIVE DIAGNOSIS:  Severe 3-vessel coronary artery disease with admission for acute gallstone pancreatitis and mildly elevated troponin. SURGICAL PROCEDURES:  Coronary artery bypass grafting x4 with left internal mammary to the left anterior descending coronary artery, reversed saphenous vein graft to the first diagonal coronary artery, reversed saphenous vein graft to the obtuse marginal coronary artery, reversed saphenous vein graft to the distal right coronary artery with right greater saphenous endoscopic vein harvesting from the thigh and calf. SURGEON:  Lanelle Bal, MD.   History of Present Illness:     Patient returns to the office after coronary artery bypass grafting done in January 2019.  The patient originally presented with acute gallstone pancreatitis and mildly elevated troponin.  She was found to have severe three-vessel coronary artery disease with relatively small distal vessels.  She presented in April 2019 with new onset of pain, cardiac catheterization by Dr. Irish Lack showed the graft to the right and the mammary to the LAD patent, vein graft to a small circumflex branch and a diagonal branch were occluded.  No intervention was undertaken  Unfortunately the patient is continued to smoke.  Initially she was taking frequent nitroglycerin she notes that this is decreased to 1 or 2 a week    Past Medical History:  Diagnosis Date  . Anxiety   . Arthritis    "maybe in my  left foot" (10/14/2017)  . Bladder prolapse, female, acquired 09/14/2017  . Depression   . High cholesterol   . History of stomach ulcers   . Hypertension   . Myocardial infarction ([facility]) 12/30/2004  . Type II diabetes mellitus ([facility])      Social History   Tobacco Use  Smoking Status Former Smoker  . Packs/day: 0.12  . Years: 45.00  . Pack years: 5.40  . Types: Cigarettes  . Start date: 05/01/1972  . Last attempt to quit: 09/30/2017  . Years since quitting: 0.4  Smokeless Tobacco Never Used    Social History   Substance and Sexual Activity  Alcohol Use Yes  . Alcohol/week: 8.4 oz  . Types: 14 Glasses of wine per week     Allergies  Allergen Reactions  . Januvia [Sitagliptin] Nausea And Vomiting and Other (See Comments)    Pancreatitis (this was in Epic, but patient was unaware of this)   . Kiwi Extract Hives  . Shrimp [Shellfish Allergy] Hives  . Metformin And Related Diarrhea  . Penicillins Other (See Comments)    From childhood: Has patient had a PCN reaction causing immediate rash, facial/tongue/throat swelling, SOB or lightheadedness with hypotension: Unk Has patient had a PCN reaction causing severe rash involving mucus membranes or skin necrosis: Unk Has patient had a PCN reaction that required hospitalization: Unk Has patient had a PCN reaction occurring within the last 10 years: No If all of the above answers are "NO", then may proceed with Cephalosporin use.     Current Outpatient Medications  Medication Sig Dispense  Refill  . amLODipine (NORVASC) 10 MG tablet Take 0.5 tablets (5 mg total) by mouth daily. 90 tablet 3  . aspirin 81 MG EC tablet Take 1 tablet (81 mg total) by mouth daily. 90 tablet 3  . buPROPion (WELLBUTRIN XL) 150 MG 24 hr tablet TAKE ONE TABLET BY MOUTH ONE TIME DAILY 90 tablet 0  . Continuous Blood Gluc Sensor (FREESTYLE LIBRE 14 DAY SENSOR) MISC 2 Devices by Does not apply route every morning. 2 each 0  . EPINEPHrine (EPIPEN 2-PAK) 0.3  mg/0.3 mL IJ SOAJ injection Inject 0.3 mLs (0.3 mg total) into the muscle once as needed. (Patient taking differently: Inject 0.3 mg into the muscle once as needed (for an allergic reaction). ) 1 Device 1  . FARXIGA 10 MG TABS tablet TAKE ONE TABLET BY MOUTH ONE TIME DAILY 90 tablet 0  . HYDROcodone-acetaminophen (NORCO) 5-325 MG tablet Take 1 tablet by mouth every 6 (six) hours as needed for moderate pain. 12 tablet 0  . insulin aspart (NOVOLOG FLEXPEN) 100 UNIT/ML FlexPen Inject 10u sq prior to meals and 5u for cbg >250 30 mL 1  . Insulin Glargine (LANTUS SOLOSTAR) 100 UNIT/ML Solostar Pen Inject 35 Units into the skin daily at 10 pm. 30 pen 1  . Insulin Pen Needle (PEN NEEDLES) 32G X 4 MM MISC 1 Units by Does not apply route daily. 400 each 11  . isosorbide mononitrate (IMDUR) 30 MG 24 hr tablet Take 3 tablets (90 mg total) by mouth daily. 90 tablet 11  . lisinopril (PRINIVIL,ZESTRIL) 5 MG tablet Take 1 tablet (5 mg total) by mouth at bedtime. 90 tablet 3  . loratadine (CLARITIN) 10 MG tablet Take 1 tablet (10 mg total) by mouth daily. (Patient taking differently: Take 10 mg by mouth daily as needed for allergies. ) 90 tablet 3  . metoprolol tartrate (LOPRESSOR) 25 MG tablet Take 1 tablet (25 mg total) by mouth 2 (two) times daily. 180 tablet 1  . Multiple Vitamin (MULTIVITAMIN WITH MINERALS) TABS tablet Take 1 tablet by mouth daily.    . nitroGLYCERIN (NITROSTAT) 0.4 MG SL tablet Place 1 tablet (0.4 mg total) under the tongue every 5 (five) minutes as needed for chest pain. 25 tablet 3  . Omega-3 Fatty Acids (FISH OIL CONCENTRATE) 300 MG CAPS Take 1 capsule by mouth daily.    [name] oxyCODONE (OXY IR/ROXICODONE) 5 MG immediate release tablet Take 1 tablet (5 mg total) by mouth every 8 (eight) hours as needed for severe pain. 15 tablet 0  . ranolazine (RANEXA) 500 MG 12 hr tablet Take 1 tablet (500 mg total) by mouth 2 (two) times daily. 90 tablet 3  . sertraline (ZOLOFT) 50 MG tablet TAKE ONE TABLET BY  MOUTH ONE TIME DAILY 30 tablet 3  . TURMERIC PO Take 1 tablet by mouth daily.    . varenicline (CHANTIX STARTING MONTH PAK) 0.5 MG X 11 & 1 MG X 42 tablet Take one 0.5 mg tablet by mouth once daily for 3 days, then increase to one 0.5 mg tablet twice daily for 4 days, then increase to one 1 mg tablet twice daily. 53 tablet 0  . zolpidem (AMBIEN) 5 MG tablet Take 1 tablet (5 mg total) by mouth at bedtime as needed for sleep. 30 tablet 0  . atorvastatin (LIPITOR) 80 MG tablet Take 1 tablet (80 mg total) by mouth daily at 6 PM. 90 tablet 1   No current facility-administered medications for this visit.  Physical Exam: BP 106/61 (BP Location: Right Arm, Patient Position: Sitting, Cuff Size: Large)   Pulse 76   Resp 16   Ht 5' 6"  (1.676 m)   Wt 177 lb (80.3 kg)   SpO2 95% Comment: ON RA  BMI 28.57 kg/m   General appearance: alert and cooperative Neurologic: intact Heart: regular rate and rhythm, S1, S2 normal, no murmur, click, rub or gallop Lungs: clear to auscultation bilaterally Abdomen: soft, non-tender; bowel sounds normal; no masses,  no organomegaly Extremities: extremities normal, atraumatic, no cyanosis or edema and Homans sign is negative, no sign of DVT Wound: Sternum is stable and well-healed   Diagnostic Studies & Laboratory data:     Recent Radiology Findings:   No results found.    Recent Lab Findings: Lab Results  Component Value Date   WBC 6.5 02/10/2018   HGB 11.1 (L) 02/10/2018   HCT 35.9 (L) 02/10/2018   PLT 198 02/10/2018   GLUCOSE 179 (H) 02/08/2018   CHOL 137 10/10/2017   TRIG 144 10/10/2017   HDL 38 (L) 10/10/2017   LDLCALC 70 10/10/2017   ALT 20 12/16/2017   AST 19 12/16/2017   NA 139 02/08/2018   K 3.9 02/08/2018   CL 107 02/08/2018   CREATININE 0.96 02/08/2018   BUN 22 (H) 02/08/2018   CO2 24 02/08/2018   TSH 1.780 12/07/2017   INR 0.90 02/08/2018   HGBA1C 8.6 12/07/2017      Assessment / Plan:      Early graft failure  following coronary artery bypass grafting to a very small diagonal and circumflex branch, mammary to the LAD and vein to the posterior descending of both intact.  Patient was again counseled strongly to stop smoking.  With the small branches involved redo operation would not be indicated, and for the same reason attempted angioplasty on the small vessels would not likely result in a favorable outcome.  She continues with close medical follow-up  She remains on aspirin 81 mg a day per cardiology, currently she is not on Plavix     Grace Isaac MD      [address] [place] [address] Office [phone]   [place] [phone]  03/04/2018 1:42 PM

## 2018-03-05 MED ORDER — CANAGLIFLOZIN 300 MG PO TABS: 300.0000 mg | ORAL_TABLET | Freq: Every day | ORAL | 5 refills | Status: DC

## 2018-03-05 NOTE — Telephone Encounter (Addendum)
Looks like Invokana might be on formuarly rather than Farixga??? Not sure but sent in [facility] for her to try to fill. Please make sure pt drops by office to pick up Invokana coupon or can print out her own after filling out some basic info on [email or web address] This coupon should make the medicine free for her.

## 2018-03-08 NOTE — Telephone Encounter (Signed)
Patient was informed. Voiced understanding

## 2018-03-09 ENCOUNTER — Other Ambulatory Visit: Payer: Self-pay

## 2018-03-09 ENCOUNTER — Ambulatory Visit (INDEPENDENT_AMBULATORY_CARE_PROVIDER_SITE_OTHER): Payer: BLUE CROSS/BLUE SHIELD | Admitting: Physician Assistant

## 2018-03-09 ENCOUNTER — Other Ambulatory Visit: Payer: Self-pay | Admitting: Internal Medicine

## 2018-03-09 ENCOUNTER — Encounter: Payer: Self-pay | Admitting: Physician Assistant

## 2018-03-09 VITALS — BP 117/65 | HR 82 | Temp 98.2°F | Ht 66.0 in | Wt 179.8 lb

## 2018-03-09 DIAGNOSIS — M109 Gout, unspecified: Secondary | ICD-10-CM

## 2018-03-09 MED ORDER — CLOPIDOGREL BISULFATE 75 MG PO TABS: 75.0000 mg | ORAL_TABLET | Freq: Every day | ORAL | 3 refills | Status: DC

## 2018-03-09 MED ORDER — PREDNISONE 10 MG PO TABS: ORAL_TABLET | ORAL | 0 refills | Status: DC

## 2018-03-09 NOTE — Patient Instructions (Addendum)
Start taking prednisone 40 mg once daily with food for 3 days; then take Prednisone 34m daily with food x 3 days; then take prednisone 266mdaily with food for 3 days; then take prednisone 1079m 3 days.   Prednisone can increase your blood sugar. Please continue to monitor your sugars daily and adjust medication as needed.  Come back and see me in 3-5 days to make sure you are improving.   Stay well hydrated. See below for home care tips about gout and gout prevention.   Gout Gout is painful swelling that can occur in some of your joints. Gout is a type of arthritis. This condition is caused by having too much uric acid in your body. Uric acid is a chemical that forms when your body breaks down substances called purines. Purines are important for building body proteins. When your body has too much uric acid, sharp crystals can form and build up inside your joints. This causes pain and swelling. Gout attacks can happen quickly and be very painful (acute gout). Over time, the attacks can affect more joints and become more frequent (chronic gout). Gout can also cause uric acid to build up under your skin and inside your kidneys. What are the causes? This condition is caused by too much uric acid in your blood. This can occur because:  Your kidneys do not remove enough uric acid from your blood. This is the most common cause.  Your body makes too much uric acid. This can occur with some cancers and cancer treatments. It can also occur if your body is breaking down too many red blood cells (hemolytic anemia).  You eat too many foods that are high in purines. These foods include organ meats and some seafood. Alcohol, especially beer, is also high in purines.  A gout attack may be triggered by trauma or stress. What increases the risk? This condition is more likely to develop in people who:  Have a family history of gout.  Are female and middle-aged.  Are female and have gone through  menopause.  Are obese.  Frequently drink alcohol, especially beer.  Are dehydrated.  Lose weight too quickly.  Have an organ transplant.  Have lead poisoning.  Take certain medicines, including aspirin, cyclosporine, diuretics, levodopa, and niacin.  Have kidney disease or psoriasis.  What are the signs or symptoms? An attack of acute gout happens quickly. It usually occurs in just one joint. The most common place is the big toe. Attacks often start at night. Other joints that may be affected include joints of the feet, ankle, knee, fingers, wrist, or elbow. Symptoms may include:  Severe pain.  Warmth.  Swelling.  Stiffness.  Tenderness. The affected joint may be very painful to touch.  Shiny, red, or purple skin.  Chills and fever.  Chronic gout may cause symptoms more frequently. More joints may be involved. You may also have white or yellow lumps (tophi) on your hands or feet or in other areas near your joints. How is this diagnosed? This condition is diagnosed based on your symptoms, medical history, and physical exam. You may have tests, such as:  Blood tests to measure uric acid levels.  Removal of joint fluid with a needle (aspiration) to look for uric acid crystals.  X-rays to look for joint damage.  How is this treated? Treatment for this condition has two phases: treating an acute attack and preventing future attacks. Acute gout treatment may include medicines to reduce pain and swelling,  including:  NSAIDs.  Steroids. These are strong anti-inflammatory medicines that can be taken by mouth (orally) or injected into a joint.  Colchicine. This medicine relieves pain and swelling when it is taken soon after an attack. It can be given orally or through an IV tube.  Preventive treatment may include:  Daily use of smaller doses of NSAIDs or colchicine.  Use of a medicine that reduces uric acid levels in your blood.  Changes to your diet. You may need to  see a specialist about healthy eating (dietitian).  Follow these instructions at home: During a Gout Attack  If directed, apply ice to the affected area: ? Put ice in a plastic bag. ? Place a towel between your skin and the bag. ? Leave the ice on for 20 minutes, 2-3 times a day.  Rest the joint as much as possible. If the affected joint is in your leg, you may be given crutches to use.  Raise (elevate) the affected joint above the level of your heart as often as possible.  Drink enough fluids to keep your urine clear or pale yellow.  Take over-the-counter and prescription medicines only as told by your health care provider.  Do not drive or operate heavy machinery while taking prescription pain medicine.  Follow instructions from your health care provider about eating or drinking restrictions.  Return to your normal activities as told by your health care provider. Ask your health care provider what activities are safe for you. Avoiding Future Gout Attacks  Follow a low-purine diet as told by your dietitian or health care provider. Avoid foods and drinks that are high in purines, including liver, kidney, anchovies, asparagus, herring, mushrooms, mussels, and beer.  Limit alcohol intake to no more than 1 drink a day for nonpregnant women and 2 drinks a day for men. One drink equals 12 oz of beer, 5 oz of wine, or 1 oz of hard liquor.  Maintain a healthy weight or lose weight if you are overweight. If you want to lose weight, talk with your health care provider. It is important that you do not lose weight too quickly.  Start or maintain an exercise program as told by your health care provider.  Drink enough fluids to keep your urine clear or pale yellow.  Take over-the-counter and prescription medicines only as told by your health care provider.  Keep all follow-up visits as told by your health care provider. This is important. Contact a health care provider if:  You have  another gout attack.  You continue to have symptoms of a gout attack after10 days of treatment.  You have side effects from your medicines.  You have chills or a fever.  You have burning pain when you urinate.  You have pain in your lower back or belly. Get help right away if:  You have severe or uncontrolled pain.  You cannot urinate. This information is not intended to replace advice given to you by your health care provider. Make sure you discuss any questions you have with your health care provider. Document Released: 09/05/2000 Document Revised: 02/14/2016 Document Reviewed: 06/21/2015 Elsevier Interactive Patient Education  2018 [employer].   IF you received an x-ray today, you will receive an invoice from [facility]. Please contact [facility] at [phone] with questions or concerns regarding your invoice.   IF you received labwork today, you will receive an invoice from [facility]. Please contact [facility] at [phone] with questions or concerns regarding your invoice.  Our billing staff will not be able to assist you with questions regarding bills from these companies.  You will be contacted with the lab results as soon as they are available. The fastest way to get your results is to activate your My Chart account. Instructions are located on the last page of this paperwork. If you have not heard from [place] regarding the results in 2 weeks, please contact this office.

## 2018-03-09 NOTE — Progress Notes (Signed)
Victoria Eaton  MRN: [id number] DOB: [birth year]  PCP: Shawnee Knapp, MD  Subjective:  Pt is a 57 year old female who presents to clinic for right foot swelling x 3 days. Endorses redness and swelling. Symptoms worsened yesterday and today.  No change in diet.  No h/o gout.  Her son found a stray dog this weekend. She took it for a walk and it was difficult as the dog pulled her around a lot. This was also more walking than normal for her.  She experiences increased pain with bed sheet brushing over her foot.  No MOI.  Denies fever, chills, n/v, laceration, reduced ROM, weakness, n/t.   Pt  has a past medical history of Anxiety, Arthritis, Bladder prolapse, female, acquired (09/14/2017), Depression, High cholesterol, History of stomach ulcers, Hypertension, Myocardial infarction ([place]) (12/30/2004), and Type II diabetes mellitus ([place]).  Review of Systems  Constitutional: Negative for chills, diaphoresis, fatigue and fever.  Musculoskeletal: Positive for arthralgias, gait problem and joint swelling (right foot). Back pain: right foot.  Skin: Positive for color change. Negative for wound.  Neurological: Negative for weakness and numbness.    Patient Active Problem List   Diagnosis Date Noted  . Non-ST elevation (NSTEMI) myocardial infarction ([place]) 02/17/2018  . Type 2 diabetes mellitus with complication, with long-term current use of insulin ([place]) 02/17/2018  . Chest pain 01/01/2018  . Unstable angina ([place]) 01/01/2018  . Arm contusion, left, initial encounter 11/25/2017  . Contusion of right knee 11/25/2017  . Hx of CABG 10/19/2017  . Epigastric pain   . Pancreatitis 10/10/2017  . Hyperlipidemia LDL goal <70 09/14/2017  . Essential hypertension 05/01/2017  . Anxiety and depression 05/01/2017  . Tobacco abuse 05/01/2017  . History of MI (myocardial infarction) 05/01/2017  . Allergic rhinitis 05/01/2017  . History of arthritis 05/01/2017  . Bladder prolapse, female, acquired  11/09/2013    Current Outpatient Medications on File Prior to Visit  Medication Sig Dispense Refill  . amLODipine (NORVASC) 10 MG tablet Take 0.5 tablets (5 mg total) by mouth daily. 90 tablet 3  . aspirin 81 MG EC tablet Take 1 tablet (81 mg total) by mouth daily. 90 tablet 3  . atorvastatin (LIPITOR) 80 MG tablet Take 1 tablet (80 mg total) by mouth daily at 6 PM. 90 tablet 1  . buPROPion (WELLBUTRIN XL) 150 MG 24 hr tablet TAKE ONE TABLET BY MOUTH ONE TIME DAILY 90 tablet 0  . canagliflozin (INVOKANA) 300 MG TABS tablet Take 1 tablet (300 mg total) by mouth daily before breakfast. 30 tablet 5  . Continuous Blood Gluc Sensor (FREESTYLE LIBRE 14 DAY SENSOR) MISC 2 Devices by Does not apply route every morning. 2 each 0  . EPINEPHrine (EPIPEN 2-PAK) 0.3 mg/0.3 mL IJ SOAJ injection Inject 0.3 mLs (0.3 mg total) into the muscle once as needed. (Patient taking differently: Inject 0.3 mg into the muscle once as needed (for an allergic reaction). ) 1 Device 1  . HYDROcodone-acetaminophen (NORCO) 5-325 MG tablet Take 1 tablet by mouth every 6 (six) hours as needed for moderate pain. 12 tablet 0  . insulin aspart (NOVOLOG FLEXPEN) 100 UNIT/ML FlexPen Inject 10u sq prior to meals and 5u for cbg >250 30 mL 1  . Insulin Glargine (LANTUS SOLOSTAR) 100 UNIT/ML Solostar Pen Inject 35 Units into the skin daily at 10 pm. 30 pen 1  . Insulin Pen Needle (PEN NEEDLES) 32G X 4 MM MISC 1 Units by Does not apply route daily. 400 each  11  . isosorbide mononitrate (IMDUR) 30 MG 24 hr tablet Take 3 tablets (90 mg total) by mouth daily. 90 tablet 11  . lisinopril (PRINIVIL,ZESTRIL) 5 MG tablet Take 1 tablet (5 mg total) by mouth at bedtime. 90 tablet 3  . loratadine (CLARITIN) 10 MG tablet Take 1 tablet (10 mg total) by mouth daily. (Patient taking differently: Take 10 mg by mouth daily as needed for allergies. ) 90 tablet 3  . metoprolol tartrate (LOPRESSOR) 25 MG tablet Take 1 tablet (25 mg total) by mouth 2 (two) times  daily. 180 tablet 1  . Multiple Vitamin (MULTIVITAMIN WITH MINERALS) TABS tablet Take 1 tablet by mouth daily.    . nitroGLYCERIN (NITROSTAT) 0.4 MG SL tablet Place 1 tablet (0.4 mg total) under the tongue every 5 (five) minutes as needed for chest pain. 25 tablet 3  . Omega-3 Fatty Acids (FISH OIL CONCENTRATE) 300 MG CAPS Take 1 capsule by mouth daily.    [name] oxyCODONE (OXY IR/ROXICODONE) 5 MG immediate release tablet Take 1 tablet (5 mg total) by mouth every 8 (eight) hours as needed for severe pain. 15 tablet 0  . ranolazine (RANEXA) 500 MG 12 hr tablet Take 1 tablet (500 mg total) by mouth 2 (two) times daily. 90 tablet 3  . sertraline (ZOLOFT) 50 MG tablet TAKE ONE TABLET BY MOUTH ONE TIME DAILY 30 tablet 3  . TURMERIC PO Take 1 tablet by mouth daily.    . varenicline (CHANTIX STARTING MONTH PAK) 0.5 MG X 11 & 1 MG X 42 tablet Take one 0.5 mg tablet by mouth once daily for 3 days, then increase to one 0.5 mg tablet twice daily for 4 days, then increase to one 1 mg tablet twice daily. 53 tablet 0  . zolpidem (AMBIEN) 5 MG tablet Take 1 tablet (5 mg total) by mouth at bedtime as needed for sleep. 30 tablet 0   No current facility-administered medications on file prior to visit.     Allergies  Allergen Reactions  . Januvia [Sitagliptin] Nausea And Vomiting and Other (See Comments)    Pancreatitis (this was in Epic, but patient was unaware of this)   . Kiwi Extract Hives  . Shrimp [Shellfish Allergy] Hives  . Metformin And Related Diarrhea  . Penicillins Other (See Comments)    From childhood: Has patient had a PCN reaction causing immediate rash, facial/tongue/throat swelling, SOB or lightheadedness with hypotension: Unk Has patient had a PCN reaction causing severe rash involving mucus membranes or skin necrosis: Unk Has patient had a PCN reaction that required hospitalization: Unk Has patient had a PCN reaction occurring within the last 10 years: No If all of the above answers are "NO",  then may proceed with Cephalosporin use.      Objective:  BP 117/65 (BP Location: Left Arm, Patient Position: Sitting, Cuff Size: Normal)   Pulse 82   Temp 98.2 F (36.8 C) (Oral)   Ht 5' 6"  (1.676 m)   Wt 179 lb 12.8 oz (81.6 kg)   SpO2 96%   BMI 29.02 kg/m   Physical Exam  Constitutional: She is oriented to person, place, and time. No distress.  Musculoskeletal:       Right foot: There is tenderness, bony tenderness (TTP MTP joint right great toe. ) and swelling (dorsal mid foot with warmth to touch). There is normal range of motion and no deformity.       Feet:  Neurological: She is alert and oriented to person, place, and  time.  Skin: Skin is warm and dry. No abrasion, no laceration and no lesion noted.  Psychiatric: Judgment normal.  Vitals reviewed.   Assessment and Plan :  1. Acute gout involving toe of right foot, unspecified cause - pt presents with right foot swelling and redness. HPI and PE suggests gout. Suspect gout 2/2 trauma from walking a big dog. Contraindication for NSAID gout therapy with extensive cardiovascular history. She is a diabetic on insulin. Home blood sugars are mostly controlled. Advised pt to keep close eye on home blood sugars while taking Prednsone. RTC in 3-5 days for recheck symptoms. F/u with PCP, Dr. Brigitte Pulse. Information regarding gout and gout prevention printed off for pt. - predniSONE (DELTASONE) 10 MG tablet; Take 16m x 3 days; 363mx 3 days; 2073m 3 days; 63m64m3 days.  Dispense: 30 tablet; Refill: 0   WhitMercer Pod-C  [facility] [place]8/2019 3:17 PM

## 2018-03-12 ENCOUNTER — Ambulatory Visit: Payer: BLUE CROSS/BLUE SHIELD | Admitting: Physician Assistant

## 2018-03-12 ENCOUNTER — Other Ambulatory Visit: Payer: Self-pay | Admitting: Physician Assistant

## 2018-03-12 DIAGNOSIS — E119 Type 2 diabetes mellitus without complications: Secondary | ICD-10-CM

## 2018-03-12 DIAGNOSIS — F32A Depression, unspecified: Secondary | ICD-10-CM

## 2018-03-12 DIAGNOSIS — F419 Anxiety disorder, unspecified: Secondary | ICD-10-CM

## 2018-03-12 DIAGNOSIS — F329 Major depressive disorder, single episode, unspecified: Secondary | ICD-10-CM

## 2018-03-12 NOTE — Telephone Encounter (Signed)
[facility] called and spoke to [place], [facility], advised Glipizide was discontinued in March, 2019.

## 2018-03-19 ENCOUNTER — Telehealth: Payer: Self-pay | Admitting: *Deleted

## 2018-04-11 ENCOUNTER — Other Ambulatory Visit: Payer: Self-pay | Admitting: Family Medicine

## 2018-04-11 DIAGNOSIS — F32A Depression, unspecified: Secondary | ICD-10-CM

## 2018-04-11 DIAGNOSIS — F329 Major depressive disorder, single episode, unspecified: Secondary | ICD-10-CM

## 2018-04-11 DIAGNOSIS — F419 Anxiety disorder, unspecified: Principal | ICD-10-CM

## 2018-04-13 ENCOUNTER — Ambulatory Visit: Payer: BLUE CROSS/BLUE SHIELD | Admitting: Internal Medicine

## 2018-04-15 ENCOUNTER — Telehealth: Payer: Self-pay | Admitting: Family Medicine

## 2018-04-15 NOTE — Telephone Encounter (Signed)
Copied from [place] [id number]. Topic: Quick Communication - See Telephone Encounter >> Apr 15, 2018  9:12 AM Conception Chancy, NT wrote: CRM for notification. See Telephone encounter for: 04/15/18.  Patient is calling and requesting a refill on Continuous Blood Gluc Sensor (FREESTYLE LIBRE 14 DAY SENSOR) MISC, varenicline (CHANTIX STARTING MONTH PAK) 0.5 MG X 11 & 1 MG X 42 tablet and Ambien. She states the Ambien she was given in the hospital.   [facility] [address] - [place], [place] - [address] [place] [address] Phone: [phone] Fax: [phone]

## 2018-04-16 ENCOUNTER — Telehealth (HOSPITAL_COMMUNITY): Payer: Self-pay | Admitting: Pharmacist

## 2018-04-16 NOTE — Telephone Encounter (Signed)
Cardiac Rehab Medication Review by a Pharmacist  Does the patient  feel that his/her medications are working for him/her?  yes  Has the patient been experiencing any side effects to the medications prescribed?  yes  Does the patient measure his/her own blood pressure or blood glucose at home?  yes   Does the patient have any problems obtaining medications due to transportation or finances?   yes  Understanding of regimen: fair Understanding of indications: fair Potential of compliance: fair    Pharmacist comments: Patient has an issue where she wakes up in the morning and feels a tingling, stiff / swollen sensation in her fingers and thumbs on both hands, which wears off later in the day but happens frequently throughout the week. (>3x / week). Patient is adherent to her medications, has ran out of Chantix, and Ambien, but is contacting her PCP to renew those prescriptions. As she ran out of chantix she has started to smoke again. Patient is possibly going to have issues with driving to her appointments after the first August 1st cardiac rehab appointment. Patient states no issues with getting medications.   Tamela Gammon, PharmD PGY1 Pharmacy Resident Direct Phone: [phone] 04/16/2018  2:18 PM

## 2018-04-16 NOTE — Telephone Encounter (Signed)
Patient is requesting several Rx:   Freestyle Libre 14 day sensor refill - outside provider  Last filled: 02/10/18 Rx needs to be corrected for device and needs additional refills- needs now  Chantix starting month pak- outside provider    Last ordered: 02/10/18 -  Patient did starter pack and ran out- she is smoking again. Patient was doing well with starter pack and had stopped smoking- she ran out- she has been off 2 weeks-she wants to know?- does she restart and then continue- she did not have access to continuation pack. Please advise.  Ambien 5 mg- outside provider  - Last ordered: 02/10/18- Call to patient- she states she was given Rx at hospital-  She is requesting refill of this or another sleep aid that is not a controlled substance since she knows that she may be required to come in for appointment and paperwork. (either/or)  LOV:03/02/18  PCP: [place]: verified

## 2018-04-22 ENCOUNTER — Encounter (HOSPITAL_COMMUNITY)
Admission: RE | Admit: 2018-04-22 | Discharge: 2018-04-22 | Disposition: A | Discharge status: Home / Self Care | Payer: BLUE CROSS/BLUE SHIELD | Source: Ambulatory Visit | Attending: Internal Medicine | Admitting: Internal Medicine

## 2018-04-22 ENCOUNTER — Encounter (HOSPITAL_COMMUNITY): Payer: Self-pay

## 2018-04-22 VITALS — BP 108/72 | HR 68 | Ht 66.0 in | Wt 188.3 lb

## 2018-04-22 DIAGNOSIS — F329 Major depressive disorder, single episode, unspecified: Secondary | ICD-10-CM | POA: Insufficient documentation

## 2018-04-22 DIAGNOSIS — F17210 Nicotine dependence, cigarettes, uncomplicated: Secondary | ICD-10-CM | POA: Insufficient documentation

## 2018-04-22 DIAGNOSIS — Z951 Presence of aortocoronary bypass graft: Secondary | ICD-10-CM | POA: Diagnosis not present

## 2018-04-22 DIAGNOSIS — E119 Type 2 diabetes mellitus without complications: Secondary | ICD-10-CM | POA: Diagnosis not present

## 2018-04-22 DIAGNOSIS — I10 Essential (primary) hypertension: Secondary | ICD-10-CM | POA: Insufficient documentation

## 2018-04-22 DIAGNOSIS — Z794 Long term (current) use of insulin: Secondary | ICD-10-CM | POA: Diagnosis not present

## 2018-04-22 DIAGNOSIS — F419 Anxiety disorder, unspecified: Secondary | ICD-10-CM | POA: Insufficient documentation

## 2018-04-22 DIAGNOSIS — E785 Hyperlipidemia, unspecified: Secondary | ICD-10-CM | POA: Diagnosis not present

## 2018-04-22 DIAGNOSIS — I214 Non-ST elevation (NSTEMI) myocardial infarction: Secondary | ICD-10-CM | POA: Insufficient documentation

## 2018-04-22 DIAGNOSIS — Z79899 Other long term (current) drug therapy: Secondary | ICD-10-CM | POA: Insufficient documentation

## 2018-04-22 NOTE — Progress Notes (Signed)
Cardiac Individual Treatment Plan  Patient Details  Name: Victoria Eaton MRN: [id number] Date of Birth: 03-05-1961 Referring Provider:     CARDIAC REHAB PHASE II ORIENTATION from 04/22/2018 in [facility]  Referring Provider  Victoria Bishop MD      Initial Encounter Date:    CARDIAC REHAB PHASE II ORIENTATION from 04/22/2018 in [facility]  Date  04/22/18      Visit Diagnosis: 12/24/17 NSTEMI (non-ST elevated myocardial infarction) ([name])  10/19/17 S/P CABG x 4  Patient's Home Medications on Admission:  Current Outpatient Medications:  .  amLODipine (NORVASC) 10 MG tablet, Take 0.5 tablets (5 mg total) by mouth daily., Disp: 90 tablet, Rfl: 3 .  aspirin 81 MG EC tablet, Take 1 tablet (81 mg total) by mouth daily., Disp: 90 tablet, Rfl: 3 .  atorvastatin (LIPITOR) 80 MG tablet, Take 1 tablet (80 mg total) by mouth daily at 6 PM., Disp: 90 tablet, Rfl: 1 .  buPROPion (WELLBUTRIN XL) 150 MG 24 hr tablet, TAKE ONE TABLET BY MOUTH ONE TIME DAILY, Disp: 90 tablet, Rfl: 0 .  canagliflozin (INVOKANA) 300 MG TABS tablet, Take 1 tablet (300 mg total) by mouth daily before breakfast., Disp: 30 tablet, Rfl: 5 .  clopidogrel (PLAVIX) 75 MG tablet, Take 1 tablet (75 mg total) by mouth daily., Disp: 90 tablet, Rfl: 3 .  Continuous Blood Gluc Sensor (FREESTYLE LIBRE 14 DAY SENSOR) MISC, 2 Devices by Does not apply route every morning., Disp: 2 each, Rfl: 0 .  EPINEPHrine (EPIPEN 2-PAK) 0.3 mg/0.3 mL IJ SOAJ injection, Inject 0.3 mLs (0.3 mg total) into the muscle once as needed. (Patient taking differently: Inject 0.3 mg into the muscle once as needed (for an allergic reaction). ), Disp: 1 Device, Rfl: 1 .  HYDROcodone-acetaminophen (NORCO) 5-325 MG tablet, Take 1 tablet by mouth every 6 (six) hours as needed for moderate pain. (Patient not taking: Reported on 04/16/2018), Disp: 12 tablet, Rfl: 0 .  insulin aspart (NOVOLOG FLEXPEN) 100 UNIT/ML  FlexPen, Inject 10u sq prior to meals and 5u for cbg >250, Disp: 30 mL, Rfl: 1 .  Insulin Glargine (LANTUS SOLOSTAR) 100 UNIT/ML Solostar Pen, Inject 35 Units into the skin daily at 10 pm., Disp: 30 pen, Rfl: 1 .  Insulin Pen Needle (PEN NEEDLES) 32G X 4 MM MISC, 1 Units by Does not apply route daily., Disp: 400 each, Rfl: 11 .  isosorbide mononitrate (IMDUR) 30 MG 24 hr tablet, Take 3 tablets (90 mg total) by mouth daily., Disp: 90 tablet, Rfl: 11 .  lisinopril (PRINIVIL,ZESTRIL) 5 MG tablet, Take 1 tablet (5 mg total) by mouth at bedtime., Disp: 90 tablet, Rfl: 3 .  loratadine (CLARITIN) 10 MG tablet, Take 1 tablet (10 mg total) by mouth daily. (Patient taking differently: Take 10 mg by mouth daily as needed for allergies. ), Disp: 90 tablet, Rfl: 3 .  metoprolol tartrate (LOPRESSOR) 25 MG tablet, Take 1 tablet (25 mg total) by mouth 2 (two) times daily., Disp: 180 tablet, Rfl: 1 .  Multiple Vitamin (MULTIVITAMIN WITH MINERALS) TABS tablet, Take 1 tablet by mouth daily., Disp: , Rfl:  .  nitroGLYCERIN (NITROSTAT) 0.4 MG SL tablet, Place 1 tablet (0.4 mg total) under the tongue every 5 (five) minutes as needed for chest pain., Disp: 25 tablet, Rfl: 3 .  Omega-3 Fatty Acids (FISH OIL CONCENTRATE) 300 MG CAPS, Take 1 capsule by mouth daily., Disp: , Rfl:  .  oxyCODONE (OXY IR/ROXICODONE) 5  MG immediate release tablet, Take 1 tablet (5 mg total) by mouth every 8 (eight) hours as needed for severe pain. (Patient not taking: Reported on 04/16/2018), Disp: 15 tablet, Rfl: 0 .  predniSONE (DELTASONE) 10 MG tablet, Take 80m x 3 days; 366mx 3 days; 2046m 3 days; 17m31m3 days. (Patient not taking: Reported on 04/16/2018), Disp: 30 tablet, Rfl: 0 .  ranolazine (RANEXA) 500 MG 12 hr tablet, Take 1 tablet (500 mg total) by mouth 2 (two) times daily., Disp: 90 tablet, Rfl: 3 .  sertraline (ZOLOFT) 50 MG tablet, TAKE ONE TABLET BY MOUTH ONE TIME DAILY, Disp: 30 tablet, Rfl: 3 .  TURMERIC PO, Take 1 tablet by mouth  daily., Disp: , Rfl:  .  varenicline (CHANTIX STARTING MONTH PAK) 0.5 MG X 11 & 1 MG X 42 tablet, Take one 0.5 mg tablet by mouth once daily for 3 days, then increase to one 0.5 mg tablet twice daily for 4 days, then increase to one 1 mg tablet twice daily. (Patient not taking: Reported on 04/16/2018), Disp: 53 tablet, Rfl: 0 .  zolpidem (AMBIEN) 5 MG tablet, Take 1 tablet (5 mg total) by mouth at bedtime as needed for sleep., Disp: 30 tablet, Rfl: 0  Past Medical History: Past Medical History:  Diagnosis Date  . Anxiety   . Arthritis    "maybe in my left foot" (10/14/2017)  . Bladder prolapse, female, acquired 09/14/2017  . Depression   . High cholesterol   . History of stomach ulcers   . Hypertension   . Myocardial infarction (HCC)[place]/06/2005  . Tobacco abuse   . Type II diabetes mellitus (HCC)     Tobacco Use: Social History   Tobacco Use  Smoking Status Current Every Day Smoker  . Packs/day: 0.12  . Years: 45.00  . Pack years: 5.40  . Types: Cigarettes  . Start date: 05/01/1972  . Last attempt to quit: 09/30/2017  . Years since quitting: 0.5  Smokeless Tobacco Never Used    Labs: Recent Review Flowsheet Data    Labs for ITP Cardiac and Pulmonary Rehab Latest Ref Rng & Units 10/19/2017 10/19/2017 10/19/2017 10/20/2017 12/07/2017   Cholestrol 0 - 200 mg/dL - - - - -   LDLCALC 0 - 99 mg/dL - - - - -   HDL >40 mg/dL - - - - -   Trlycerides <150 mg/dL - - - - -   Hemoglobin A1c - - - - - 8.6   PHART 7.350 - 7.450 7.336(L) 7.308(L) - - -   PCO2ART 32.0 - 48.0 mmHg 43.5 46.1 - - -   HCO3 20.0 - 28.0 mmol/L 23.5 23.3 - - -   TCO2 22 - 32 mmol/L 25 25 23 22  -   ACIDBASEDEF 0.0 - 2.0 mmol/L 2.0 3.0(H) - - -   O2SAT % 99.0 98.0 - - -      Capillary Blood Glucose: Lab Results  Component Value Date   GLUCAP 226 (H) 02/10/2018   GLUCAP 134 (H) 02/10/2018   GLUCAP 272 (H) 02/09/2018   GLUCAP 106 (H) 02/09/2018   GLUCAP 206 (H) 02/09/2018     Exercise Target Goals: Date:  04/22/18  Exercise Program Goal: Individual exercise prescription set using results from initial 6 min walk test and THRR while considering  patient's activity barriers and safety.   Exercise Prescription Goal: Initial exercise prescription builds to 30-45 minutes a day of aerobic activity, 2-3 days per week.  Home exercise guidelines  will be given to patient during program as part of exercise prescription that the participant will acknowledge.  Activity Barriers & Risk Stratification: Activity Barriers & Cardiac Risk Stratification - 04/22/18 1431      Activity Barriers & Cardiac Risk Stratification   Activity Barriers  Other (comment);Muscular Weakness;Deconditioning;Back Problems;Joint Problems;Arthritis    Comments  B hip pain, B shoulder pain     Cardiac Risk Stratification  High       6 Minute Walk: 6 Minute Walk    Row Name 04/22/18 1554 04/22/18 1610       6 Minute Walk   Phase  Initial  -    Distance  1437 feet  -    Walk Time  6 minutes  -    # of Rest Breaks  0  -    MPH  2.7  -    METS  3.6  -    RPE  14  -    VO2 Peak  12.67  -    Symptoms  No  Yes (comment) Patient experienced b/l leg and hip pain resolved with rest    Resting HR  68 bpm  -    Resting BP  108/72  -    Resting Oxygen Saturation   95 %  -    Exercise Oxygen Saturation  during 6 min walk  97 %  -    Max Ex. HR  98 bpm  -    Max Ex. BP  142/64  -    2 Minute Post BP  114/82  -       Oxygen Initial Assessment:   Oxygen Re-Evaluation:   Oxygen Discharge (Final Oxygen Re-Evaluation):   Initial Exercise Prescription: Initial Exercise Prescription - 04/22/18 1500      Date of Initial Exercise RX and Referring Provider   Date  04/22/18    Referring Provider  Victoria Bishop MD      Treadmill   MPH  2.5    Grade  0    Minutes  10    METs  2.91      Bike   Level  0.8    Minutes  10    METs  2.78      NuStep   Level  3    SPM  80    Minutes  10    METs  2      Prescription  Details   Frequency (times per week)  3    Duration  Progress to 30 minutes of continuous aerobic without signs/symptoms of physical distress      Intensity   THRR 40-80% of Max Heartrate  64-129    Ratings of Perceived Exertion  11-13    Perceived Dyspnea  0-4      Progression   Progression  Continue to progress workloads to maintain intensity without signs/symptoms of physical distress.      Resistance Training   Training Prescription  Yes    Weight  2lbs    Reps  10-15       Perform Capillary Blood Glucose checks as needed.  Exercise Prescription Changes:   Exercise Comments:   Exercise Goals and Review:  Exercise Goals    Row Name 04/22/18 1431             Exercise Goals   Increase Physical Activity  Yes       Intervention  Provide advice, education, support and counseling about physical activity/exercise needs.;Develop an individualized exercise  prescription for aerobic and resistive training based on initial evaluation findings, risk stratification, comorbidities and participant's personal goals.       Expected Outcomes  Short Term: Attend rehab on a regular basis to increase amount of physical activity.;Long Term: Exercising regularly at least 3-5 days a week.;Long Term: Add in home exercise to make exercise part of routine and to increase amount of physical activity.       Increase Strength and Stamina  Yes       Intervention  Provide advice, education, support and counseling about physical activity/exercise needs.;Develop an individualized exercise prescription for aerobic and resistive training based on initial evaluation findings, risk stratification, comorbidities and participant's personal goals.       Expected Outcomes  Short Term: Increase workloads from initial exercise prescription for resistance, speed, and METs.;Short Term: Perform resistance training exercises routinely during rehab and add in resistance training at home;Long Term: Improve  cardiorespiratory fitness, muscular endurance and strength as measured by increased METs and functional capacity (6MWT)       Able to understand and use rate of perceived exertion (RPE) scale  Yes       Intervention  Provide education and explanation on how to use RPE scale       Expected Outcomes  Short Term: Able to use RPE daily in rehab to express subjective intensity level;Long Term:  Able to use RPE to guide intensity level when exercising independently       Knowledge and understanding of Target Heart Rate Range (THRR)  Yes       Intervention  Provide education and explanation of THRR including how the numbers were predicted and where they are located for reference       Expected Outcomes  Short Term: Able to state/look up THRR;Long Term: Able to use THRR to govern intensity when exercising independently;Short Term: Able to use daily as guideline for intensity in rehab       Able to check pulse independently  Yes       Intervention  Provide education and demonstration on how to check pulse in carotid and radial arteries.;Review the importance of being able to check your own pulse for safety during independent exercise       Expected Outcomes  Short Term: Able to explain why pulse checking is important during independent exercise;Long Term: Able to check pulse independently and accurately       Understanding of Exercise Prescription  Yes       Intervention  Provide education, explanation, and written materials on patient's individual exercise prescription       Expected Outcomes  Short Term: Able to explain program exercise prescription;Long Term: Able to explain home exercise prescription to exercise independently          Exercise Goals Re-Evaluation :    Discharge Exercise Prescription (Final Exercise Prescription Changes):   Nutrition:  Target Goals: Understanding of nutrition guidelines, daily intake of sodium <1533m, cholesterol <2036m calories 30% from fat and 7% or less from  saturated fats, daily to have 5 or more servings of fruits and vegetables.  Biometrics:  Post Biometrics - 04/22/18 1543       Post  Biometrics   Height  5' 6"  (1.676 m)    Weight  188 lb 4.4 oz (85.4 kg)    Waist Circumference  38.25 inches    Hip Circumference  43.5 inches    Waist to Hip Ratio  0.88 %    BMI (Calculated)  30.4  Triceps Skinfold  41 mm    Grip Strength  28 kg    Flexibility  15 in    Single Leg Stand  17.44 seconds       Nutrition Therapy Plan and Nutrition Goals: Nutrition Therapy & Goals - 04/22/18 1434      Nutrition Therapy   Diet  consistent carbohydrate heart healthy      Personal Nutrition Goals   Nutrition Goal  Pt to identify food quantities necessary to achieve weight loss of 6-24 lbs. at graduation from cardiac rehab.    Personal Goal #2  Pt to identify and limit food sources of saturated fat, trans fat, and sodium    Personal Goal #3  Improved blood glucose control as evidenced by pt's A1c trending from 8.5 toward less than 7.0.      Intervention Plan   Intervention  Prescribe, educate and counsel regarding individualized specific dietary modifications aiming towards targeted core components such as weight, hypertension, lipid management, diabetes, heart failure and other comorbidities.    Expected Outcomes  Short Term Goal: Understand basic principles of dietary content, such as calories, fat, sodium, cholesterol and nutrients.       Nutrition Assessments: Nutrition Assessments - 04/22/18 1435      MEDFICTS Scores   Pre Score  67       Nutrition Goals Re-Evaluation:   Nutrition Goals Re-Evaluation:   Nutrition Goals Discharge (Final Nutrition Goals Re-Evaluation):   Psychosocial: Target Goals: Acknowledge presence or absence of significant depression and/or stress, maximize coping skills, provide positive support system. Participant is able to verbalize types and ability to use techniques and skills needed for reducing stress  and depression.  Initial Review & Psychosocial Screening: Initial Psych Review & Screening - 04/22/18 1437      Initial Review   Current issues with  Current Stress Concerns    Source of Stress Concerns  Chronic Illness;Family;Financial    Comments  Pt reports stressors related to her recent CV events and diabetes.  Her husband has been ill as well.  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 Her husband has applied for disability as well.       Family Dynamics   Good Support System?  Yes [place] lists her husband, daughters, sister, and friends as sources of support.       Barriers   Psychosocial barriers to participate in program  Psychosocial barriers identified (see note);The patient should benefit from training in stress management and relaxation. Barriers as noted above.       Screening Interventions   Interventions  Encouraged to exercise;To provide support and resources with identified psychosocial needs;Provide feedback about the scores to participant Pt offered to meet with spiritual care department and counseling.  Pt declined at this time.     Expected Outcomes  Short Term goal: Identification and review with participant of any Quality of Life or Depression concerns found by scoring the questionnaire.;Long Term goal: The participant improves quality of Life and PHQ9 Scores as seen by post scores and/or verbalization of changes       Quality of Life Scores: Quality of Life - 04/22/18 1541      Quality of Life   Select  Quality of Life      Quality of Life Scores   Health/Function Pre  9.97 %    Socioeconomic Pre  22.07 %    Psych/Spiritual Pre  11.36 %    Family Pre  15.4 %    GLOBAL Pre  13.54 %      Scores of 19 and below usually indicate a poorer quality of life in these areas.  A difference of  2-3 points is a clinically meaningful difference.  A difference of 2-3 points in the total score of the Quality of Life Index has been associated with significant  improvement in overall quality of life, self-image, physical symptoms, and general health in studies assessing change in quality of life.  PHQ-9: Recent Review Flowsheet Data    Depression screen [facility] 2/9 03/09/2018 03/02/2018 01/07/2018 12/16/2017 12/07/2017   Decreased Interest 2 3 0 1 3   Down, Depressed, Hopeless 3 3 0 1 3   PHQ - 2 Score 5 6 0 2 6   Altered sleeping 3  0 - 0 1   Tired, decreased energy 3 2 - 2 3   Change in appetite 1 3 - 1 2   Feeling bad or failure about yourself  1 2 - 2 2   Trouble concentrating 3 3 - 2 3   Moving slowly or fidgety/restless 0 0 - 0 3   Suicidal thoughts 0 0 - 0 0   PHQ-9 Score 16 16 - 9 20   Difficult doing work/chores Very difficult Very difficult - Somewhat difficult Somewhat difficult     Interpretation of Total Score  Total Score Depression Severity:  1-4 = Minimal depression, 5-9 = Mild depression, 10-14 = Moderate depression, 15-19 = Moderately severe depression, 20-27 = Severe depression   Psychosocial Evaluation and Intervention:   Psychosocial Re-Evaluation:   Psychosocial Discharge (Final Psychosocial Re-Evaluation):   Vocational Rehabilitation: Provide vocational rehab assistance to qualifying candidates.   Vocational Rehab Evaluation & Intervention: Vocational Rehab - 04/22/18 1437      Initial Vocational Rehab Evaluation & Intervention   Assessment shows need for Vocational Rehabilitation  No       Education: Education Goals: Education classes will be provided on a weekly basis, covering required topics. Participant will state understanding/return demonstration of topics presented.  Learning Barriers/Preferences: Learning Barriers/Preferences - 04/22/18 1430      Learning Barriers/Preferences   Learning Barriers  Sight    Learning Preferences  Skilled Demonstration       Education Topics: Count Your Pulse:  -Group instruction provided by verbal instruction, demonstration, patient participation and written  materials to support subject.  Instructors address importance of being able to find your pulse and how to count your pulse when at home without a heart monitor.  Patients get hands on experience counting their pulse with staff help and individually.   Heart Attack, Angina, and Risk Factor Modification:  -Group instruction provided by verbal instruction, video, and written materials to support subject.  Instructors address signs and symptoms of angina and heart attacks.    Also discuss risk factors for heart disease and how to make changes to improve heart health risk factors.   Functional Fitness:  -Group instruction provided by verbal instruction, demonstration, patient participation, and written materials to support subject.  Instructors address safety measures for doing things around the house.  Discuss how to get up and down off the floor, how to pick things up properly, how to safely get out of a chair without assistance, and balance training.   Meditation and Mindfulness:  -Group instruction provided by verbal instruction, patient participation, and written materials to support subject.  Instructor addresses importance of mindfulness and meditation practice to help reduce stress and improve awareness.  Instructor also leads participants through a meditation  exercise.    Stretching for Flexibility and Mobility:  -Group instruction provided by verbal instruction, patient participation, and written materials to support subject.  Instructors lead participants through series of stretches that are designed to increase flexibility thus improving mobility.  These stretches are additional exercise for major muscle groups that are typically performed during regular warm up and cool down.   Hands Only CPR:  -Group verbal, video, and participation provides a basic overview of AHA guidelines for community CPR. Role-play of emergencies allow participants the opportunity to practice calling for help and  chest compression technique with discussion of AED use.   Hypertension: -Group verbal and written instruction that provides a basic overview of hypertension including the most recent diagnostic guidelines, risk factor reduction with self-care instructions and medication management.    Nutrition I class: Heart Healthy Eating:  -Group instruction provided by PowerPoint slides, verbal discussion, and written materials to support subject matter. The instructor gives an explanation and review of the Therapeutic Lifestyle Changes diet recommendations, which includes a discussion on lipid goals, dietary fat, sodium, fiber, plant stanol/sterol esters, sugar, and the components of a well-balanced, healthy diet.   Nutrition II class: Lifestyle Skills:  -Group instruction provided by PowerPoint slides, verbal discussion, and written materials to support subject matter. The instructor gives an explanation and review of label reading, grocery shopping for heart health, heart healthy recipe modifications, and ways to make healthier choices when eating out.   Diabetes Question & Answer:  -Group instruction provided by PowerPoint slides, verbal discussion, and written materials to support subject matter. The instructor gives an explanation and review of diabetes co-morbidities, pre- and post-prandial blood glucose goals, pre-exercise blood glucose goals, signs, symptoms, and treatment of hypoglycemia and hyperglycemia, and foot care basics.   Diabetes Blitz:  -Group instruction provided by PowerPoint slides, verbal discussion, and written materials to support subject matter. The instructor gives an explanation and review of the physiology behind type 1 and type 2 diabetes, diabetes medications and rational behind using different medications, pre- and post-prandial blood glucose recommendations and Hemoglobin A1c goals, diabetes diet, and exercise including blood glucose guidelines for exercising safely.     Portion Distortion:  -Group instruction provided by PowerPoint slides, verbal discussion, written materials, and food models to support subject matter. The instructor gives an explanation of serving size versus portion size, changes in portions sizes over the last 20 years, and what consists of a serving from each food group.   Stress Management:  -Group instruction provided by verbal instruction, video, and written materials to support subject matter.  Instructors review role of stress in heart disease and how to cope with stress positively.     Exercising on Your Own:  -Group instruction provided by verbal instruction, power point, and written materials to support subject.  Instructors discuss benefits of exercise, components of exercise, frequency and intensity of exercise, and end points for exercise.  Also discuss use of nitroglycerin and activating EMS.  Review options of places to exercise outside of rehab.  Review guidelines for sex with heart disease.   Cardiac Drugs I:  -Group instruction provided by verbal instruction and written materials to support subject.  Instructor reviews cardiac drug classes: antiplatelets, anticoagulants, beta blockers, and statins.  Instructor discusses reasons, side effects, and lifestyle considerations for each drug class.   Cardiac Drugs II:  -Group instruction provided by verbal instruction and written materials to support subject.  Instructor reviews cardiac drug classes: angiotensin converting enzyme inhibitors (ACE-I), angiotensin II  receptor blockers (ARBs), nitrates, and calcium channel blockers.  Instructor discusses reasons, side effects, and lifestyle considerations for each drug class.   Anatomy and Physiology of the Circulatory System:  Group verbal and written instruction and models provide basic cardiac anatomy and physiology, with the coronary electrical and arterial systems. Review of: AMI, Angina, Valve disease, Heart Failure,  Peripheral Artery Disease, Cardiac Arrhythmia, Pacemakers, and the ICD.   Other Education:  -Group or individual verbal, written, or video instructions that support the educational goals of the cardiac rehab program.   Holiday Eating Survival Tips:  -Group instruction provided by PowerPoint slides, verbal discussion, and written materials to support subject matter. The instructor gives patients tips, tricks, and techniques to help them not only survive but enjoy the holidays despite the onslaught of food that accompanies the holidays.   Knowledge Questionnaire Score: Knowledge Questionnaire Score - 04/22/18 1437      Knowledge Questionnaire Score   Pre Score  26/28       Core Components/Risk Factors/Patient Goals at Admission: Personal Goals and Risk Factors at Admission - 04/22/18 1619      Core Components/Risk Factors/Patient Goals on Admission    Weight Management  Yes;Obesity;Weight Maintenance;Weight Loss    Intervention  Weight Management: Develop a combined nutrition and exercise program designed to reach desired caloric intake, while maintaining appropriate intake of nutrient and fiber, sodium and fats, and appropriate energy expenditure required for the weight goal.;Weight Management: Provide education and appropriate resources to help participant work on and attain dietary goals.;Weight Management/Obesity: Establish reasonable short term and long term weight goals.;Obesity: Provide education and appropriate resources to help participant work on and attain dietary goals.    Admit Weight  188 lb 4.4 oz (85.4 kg)    Goal Weight: Short Term  180 lb (81.6 kg)    Goal Weight: Long Term  158 lb (71.7 kg)    Expected Outcomes  Short Term: Continue to assess and modify interventions until short term weight is achieved;Long Term: Adherence to nutrition and physical activity/exercise program aimed toward attainment of established weight goal;Weight Maintenance: Understanding of the daily  nutrition guidelines, which includes 25-35% calories from fat, 7% or less cal from saturated fats, less than 264m cholesterol, less than 1.5gm of sodium, & 5 or more servings of fruits and vegetables daily;Weight Loss: Understanding of general recommendations for a balanced deficit meal plan, which promotes 1-2 lb weight loss per week and includes a negative energy balance of [phone] kcal/d;Understanding recommendations for meals to include 15-35% energy as protein, 25-35% energy from fat, 35-60% energy from carbohydrates, less than 2059mof dietary cholesterol, 20-35 gm of total fiber daily;Understanding of distribution of calorie intake throughout the day with the consumption of 4-5 meals/snacks    Diabetes  Yes    Intervention  Provide education about signs/symptoms and action to take for hypo/hyperglycemia.;Provide education about proper nutrition, including hydration, and aerobic/resistive exercise prescription along with prescribed medications to achieve blood glucose in normal ranges: Fasting glucose 65-99 mg/dL    Expected Outcomes  Short Term: Participant verbalizes understanding of the signs/symptoms and immediate care of hyper/hypoglycemia, proper foot care and importance of medication, aerobic/resistive exercise and nutrition plan for blood glucose control.;Long Term: Attainment of HbA1C < 7%.    Hypertension  Yes    Intervention  Provide education on lifestyle modifcations including regular physical activity/exercise, weight management, moderate sodium restriction and increased consumption of fresh fruit, vegetables, and low fat dairy, alcohol moderation, and smoking cessation.;Monitor prescription use compliance.  Expected Outcomes  Short Term: Continued assessment and intervention until BP is < 140/109m HG in hypertensive participants. < 130/863mHG in hypertensive participants with diabetes, heart failure or chronic kidney disease.;Long Term: Maintenance of blood pressure at goal levels.     Stress  Yes    Intervention  Offer individual and/or small group education and counseling on adjustment to heart disease, stress management and health-related lifestyle change. Teach and support self-help strategies.;Refer participants experiencing significant psychosocial distress to appropriate mental health specialists for further evaluation and treatment. When possible, include family members and significant others in education/counseling sessions.    Expected Outcomes  Short Term: Participant demonstrates changes in health-related behavior, relaxation and other stress management skills, ability to obtain effective social support, and compliance with psychotropic medications if prescribed.;Long Term: Emotional wellbeing is indicated by absence of clinically significant psychosocial distress or social isolation.       Core Components/Risk Factors/Patient Goals Review:    Core Components/Risk Factors/Patient Goals at Discharge (Final Review):    ITP Comments: ITP Comments    Row Name 04/22/18 1426           ITP Comments  Dr. TrFransico HimMedical Director          Comments: CaJadiettended orientation from 1336 to 1517 to review rules and guidelines for program. Completed 6 minute walk test, Intitial ITP, and exercise prescription.  VSS. Telemetry-Sinus Rhythm with an occasional PAC and a rare PVC .CaJeraldeanid complain of bilateral lower leg and hip pain at the end of her walk test, this resolved with rest. CaArbie Cookeyaid that she thinks her medication refills were refilled at her pharmacy at [facility] which she was concerned about.Will continue to monitor the patient throughout  the program. MaBarnet PallRN,BSN 04/22/2018 4:20 PM

## 2018-04-22 NOTE — Progress Notes (Signed)
Victoria Eaton 57 y.o. female DOB: 1960-10-18 MRN: [id number]      Nutrition Note  1. NSTEMI (non-ST elevated myocardial infarction) [facility])    Past Medical History:  Diagnosis Date  . Anxiety   . Arthritis    "maybe in my left foot" (10/14/2017)  . Bladder prolapse, female, acquired 09/14/2017  . Depression   . High cholesterol   . History of stomach ulcers   . Hypertension   . Myocardial infarction ([place]) 12/30/2004  . Tobacco abuse   . Type II diabetes mellitus ([place])    Meds reviewed. Lipitor, lopressor, MVI noted  HT: Ht Readings from Last 1 Encounters:  03/09/18 5' 6"  (1.676 m)    WT: Wt Readings from Last 5 Encounters:  03/09/18 179 lb 12.8 oz (81.6 kg)  03/04/18 177 lb (80.3 kg)  03/02/18 177 lb 6.4 oz (80.5 kg)  02/17/18 170 lb 6.4 oz (77.3 kg)  02/10/18 163 lb 12.8 oz (74.3 kg)     There is no height or weight on file to calculate BMI.   Current tobacco use? No   Labs:  Lipid Panel     Component Value Date/Time   CHOL 137 10/10/2017 1848   CHOL 175 05/01/2017 0939   TRIG 144 10/10/2017 1848   HDL 38 (L) 10/10/2017 1848   HDL 53 05/01/2017 0939   CHOLHDL 3.6 10/10/2017 1848   VLDL 29 10/10/2017 1848   LDLCALC 70 10/10/2017 1848   LDLCALC 86 05/01/2017 0939    Lab Results  Component Value Date   HGBA1C 8.6 12/07/2017   CBG (last 3)  No results for input(s): GLUCAP in the last 72 hours.  Nutrition Note Spoke with pt. Nutrition plan and goals reviewed with pt. Pt is following Step 1 of the Therapeutic Lifestyle Changes diet. Pt wants to lose wt. Pt has not actively been trying to lose wt. Wt loss tips reviewed. Pt is diabetic. Last A1c indicates blood glucose not well-controlled. Pt checks CBG's 3-4 times a day. Fasting CBG's reportedly 90's-150's mg/dL. Educated pt on consistent carbohydrate heart healthy diet. Pt was resistant and stated multiple times during session "I know what to do, I'm just not doing it." Will continue to use motivational  interviewing to help pt identify barriers to her goals. Per discussion, pt does not use canned/convenience foods often. Pt rarely adds salt to food. Pt eats out infrequently. Pt expressed understanding of the information reviewed. Pt aware of nutrition education classes offered and  plans on attending nutrition classes.  Nutrition Diagnosis ? Food-and nutrition-related knowledge deficit related to lack of exposure to information as related to diagnosis of: ? CVD ? DM  ? Obesity related to excessive energy intake as evidenced by a    Nutrition Intervention ? Pt's individual nutrition plan and goals reviewed with pt. ? Pt given handouts for: ? Nutrition I class ? Nutrition II class  ? Diabetes Blitz Class ? Diabetes Q & A class  ? Consistent vit K diet ? low sodium ? DM ? pre-diabetes  Nutrition Goal(s):   ? Pt to identify and limit food sources of saturated fat, trans fat, and sodium ? Pt to identify food quantities necessary to achieve weight loss of 6-24 lbs. at graduation from cardiac rehab. ? Improved blood glucose control as evidenced by pt's A1c trending from 8.5 toward less than 7.0.   Plan:  ? Pt to attend nutrition classes ? Nutrition I ? Nutrition II ? Portion Distortion ? Diabetes Blitz ? Diabetes Q &  Ae determined ? Will provide client-centered nutrition education as part of interdisciplinary care ? Monitor and evaluate progress toward nutrition goal with team.   Laurina Bustle, MS, RD, LDN 04/22/2018 2:30 PM

## 2018-04-23 NOTE — Progress Notes (Signed)
QUALITY OF LIFE SCORE REVIEW  Pt completed Quality of Life survey as a participant in Cardiac Rehab. Scores 21.0 or below are considered low. Pt score very low in several areas Overall 13.54, Health and Function 9.97, socioeconomic 22.07, physiological and spiritual 11.36, family 15.40. Patient quality of life slightly altered by physical constraints which limits ability to perform as prior to recent cardiac illness. Victoria Eaton is dissatisfied with her health due to her recent cardiac events and reports having low energy. Victoria Eaton has additional stressors due to lack of income and her husband being recently ill.    Offered emotional support and reassurance.  Will continue to monitor and intervene as necessary. Victoria Eaton says she has a hopeful outlook for the future and says she is not severely depressed currently. I offered  To set up an appointment to meet with the hospital chaplain. Victoria Eaton declined at this time. I also asked Victoria Eaton is she would be willing to talk to her primary care about counseling. Victoria Eaton said she is not interested at the current time..Will forward his quality of life questionnaire to Dr [facility] office for review.Harrell Gave RN BSN

## 2018-04-26 MED ORDER — VARENICLINE TARTRATE 0.5 MG PO TABS: 0.5000 mg | ORAL_TABLET | Freq: Two times a day (BID) | ORAL | 0 refills | Status: DC

## 2018-04-26 MED ORDER — FREESTYLE LIBRE 14 DAY SENSOR MISC: 2.0000 | Freq: Every morning | 5 refills | Status: DC

## 2018-04-26 MED ORDER — VARENICLINE TARTRATE 0.5 MG X 11 & 1 MG X 42 PO MISC: ORAL | 0 refills | Status: DC

## 2018-04-26 NOTE — Telephone Encounter (Signed)
Sent in sensors. Sent in chantix - needs to re-do starting dose pack. Sent in second rx for the second mo to continue it. Come in for OV within the next 2 mo to get additional refills on chantix and can discuss sleep options then. In the meantime, try diphenhydramine or z-quil, try sleepy-time tea, try melatonin supp

## 2018-04-27 ENCOUNTER — Other Ambulatory Visit: Payer: Self-pay | Admitting: Family Medicine

## 2018-04-27 NOTE — Telephone Encounter (Signed)
Copied from [place] [id number]. Topic: Quick Communication - Rx Refill/Question >> Apr 27, 2018 12:55 PM Selinda Flavin B, NT wrote: Medication: zolpidem (AMBIEN) 5 MG tablet  Has the patient contacted their pharmacy? Yes.   (Agent: If no, request that the patient contact the pharmacy for the refill.) (Agent: If yes, when and what did the pharmacy advise?)  Preferred Pharmacy (with phone number or street name): [facility], [place] - [address]  Agent: Please be advised that RX refills may take up to 3 business days. We ask that you follow-up with your pharmacy.

## 2018-04-27 NOTE — Telephone Encounter (Signed)
LOV 03/09/18 Dr. Brigitte Pulse Last refill 02/10/18  # 30 with 0 refill Dr. Raul Del name not associated with medication.

## 2018-04-28 ENCOUNTER — Encounter (HOSPITAL_COMMUNITY)
Admission: RE | Admit: 2018-04-28 | Discharge: 2018-04-28 | Disposition: A | Discharge status: Home / Self Care | Payer: BLUE CROSS/BLUE SHIELD | Source: Ambulatory Visit | Attending: Internal Medicine | Admitting: Internal Medicine

## 2018-04-28 ENCOUNTER — Encounter (HOSPITAL_COMMUNITY): Payer: BLUE CROSS/BLUE SHIELD

## 2018-04-28 VITALS — Ht 66.0 in | Wt 187.2 lb

## 2018-04-28 DIAGNOSIS — F17210 Nicotine dependence, cigarettes, uncomplicated: Secondary | ICD-10-CM | POA: Diagnosis not present

## 2018-04-28 DIAGNOSIS — F329 Major depressive disorder, single episode, unspecified: Secondary | ICD-10-CM | POA: Diagnosis not present

## 2018-04-28 DIAGNOSIS — E119 Type 2 diabetes mellitus without complications: Secondary | ICD-10-CM | POA: Diagnosis not present

## 2018-04-28 DIAGNOSIS — F419 Anxiety disorder, unspecified: Secondary | ICD-10-CM | POA: Diagnosis not present

## 2018-04-28 DIAGNOSIS — Z951 Presence of aortocoronary bypass graft: Secondary | ICD-10-CM

## 2018-04-28 DIAGNOSIS — Z79899 Other long term (current) drug therapy: Secondary | ICD-10-CM | POA: Diagnosis not present

## 2018-04-28 DIAGNOSIS — I10 Essential (primary) hypertension: Secondary | ICD-10-CM | POA: Diagnosis not present

## 2018-04-28 DIAGNOSIS — Z794 Long term (current) use of insulin: Secondary | ICD-10-CM | POA: Diagnosis not present

## 2018-04-28 DIAGNOSIS — E785 Hyperlipidemia, unspecified: Secondary | ICD-10-CM | POA: Diagnosis not present

## 2018-04-28 DIAGNOSIS — I214 Non-ST elevation (NSTEMI) myocardial infarction: Secondary | ICD-10-CM

## 2018-04-28 LAB — GLUCOSE, CAPILLARY
GLUCOSE-CAPILLARY: 160 mg/dL — AB (ref 70–99)
Glucose-Capillary: 105 mg/dL — ABNORMAL HIGH (ref 70–99)

## 2018-04-28 NOTE — Progress Notes (Signed)
Daily Session Note  Patient Details  Name: Victoria Eaton MRN: [id number] Date of Birth: 03-Sep-1961 Referring Provider:     CARDIAC REHAB PHASE II ORIENTATION from 04/22/2018 in [place]  Referring Provider  Victoria Bishop MD      Encounter Date: 04/28/2018  Check In: Session Check In - 04/28/18 1032      Check-In   Supervising physician immediately available to respond to emergencies  [facility] immediately available    Physician(s)  Dr. Herbert Eaton    Location  [facility]    Staff Present  Barnet Pall, RN, Toma Deiters, RN, Deland Pretty, MS, ACSM CEP, Exercise Physiologist;Joann Rion, RN, BSN    Medication changes reported      No    Fall or balance concerns reported     No    Tobacco Cessation  No Change    Warm-up and Cool-down  Performed as group-led instruction    Resistance Training Performed  No    VAD Patient?  No    PAD/SET Patient?  No      Pain Assessment   Currently in Pain?  No/denies       Capillary Blood Glucose: Results for orders placed or performed during the hospital encounter of 04/28/18 (from the past 24 hour(s))  Glucose, capillary     Status: Abnormal   Collection Time: 04/28/18 10:46 AM  Result Value Ref Range   Glucose-Capillary 105 (H) 70 - 99 mg/dL      Social History   Tobacco Use  Smoking Status Current Every Day Smoker  . Packs/day: 0.12  . Years: 45.00  . Pack years: 5.40  . Types: Cigarettes  . Start date: 05/01/1972  . Last attempt to quit: 09/30/2017  . Years since quitting: 0.5  Smokeless Tobacco Never Used    Goals Met:  Exercise tolerated well  Goals Unmet:  Not Applicable  Comments: Pt started cardiac rehab today.  Pt tolerated light exercise without difficulty. VSS, telemetry-Sinus Rhtyhm, Victoria Eaton did report lower leg soreness while on the bike.  Medication list reconciled. Pt denies barriers to medicaiton compliance.  PSYCHOSOCIAL ASSESSMENT:  PHQ-2. Pt  exhibits positive coping skills, hopeful outlook with supportive family.     Pt enjoys reading and gardening.   Pt oriented to exercise equipment and routine.    Understanding verbalized. Victoria Eaton continues to smoke. Smoking cessation encouraged. Patient says she has a refill on her Chantix prescription and plans to start taking it soon.Barnet Pall, RN,BSN 04/28/2018 11:15 AM   Dr. Fransico Eaton is Medical Director for Cardiac Rehab at [facility].

## 2018-04-28 NOTE — Progress Notes (Signed)
Cardiac Individual Treatment Plan  Patient Details  Name: Victoria Eaton MRN: [id number] Date of Birth: 08-26-1961 Referring Provider:     CARDIAC REHAB PHASE II ORIENTATION from 04/22/2018 in [place]  Referring Provider  Lyman Bishop MD      Initial Encounter Date:    CARDIAC REHAB PHASE II ORIENTATION from 04/22/2018 in [place]  Date  04/22/18      Visit Diagnosis: 10/19/17 S/P CABG x 4  12/24/17 NSTEMI (non-ST elevated myocardial infarction) [facility])  Patient's Home Medications on Admission:  Current Outpatient Medications:  .  amLODipine (NORVASC) 10 MG tablet, Take 0.5 tablets (5 mg total) by mouth daily., Disp: 90 tablet, Rfl: 3 .  aspirin 81 MG EC tablet, Take 1 tablet (81 mg total) by mouth daily., Disp: 90 tablet, Rfl: 3 .  atorvastatin (LIPITOR) 80 MG tablet, Take 1 tablet (80 mg total) by mouth daily at 6 PM., Disp: 90 tablet, Rfl: 1 .  buPROPion (WELLBUTRIN XL) 150 MG 24 hr tablet, TAKE ONE TABLET BY MOUTH ONE TIME DAILY, Disp: 90 tablet, Rfl: 0 .  canagliflozin (INVOKANA) 300 MG TABS tablet, Take 1 tablet (300 mg total) by mouth daily before breakfast., Disp: 30 tablet, Rfl: 5 .  clopidogrel (PLAVIX) 75 MG tablet, Take 1 tablet (75 mg total) by mouth daily., Disp: 90 tablet, Rfl: 3 .  Continuous Blood Gluc Sensor (FREESTYLE LIBRE 14 DAY SENSOR) MISC, 2 Devices by Does not apply route every morning., Disp: 2 each, Rfl: 5 .  EPINEPHrine (EPIPEN 2-PAK) 0.3 mg/0.3 mL IJ SOAJ injection, Inject 0.3 mLs (0.3 mg total) into the muscle once as needed. (Patient taking differently: Inject 0.3 mg into the muscle once as needed (for an allergic reaction). ), Disp: 1 Device, Rfl: 1 .  HYDROcodone-acetaminophen (NORCO) 5-325 MG tablet, Take 1 tablet by mouth every 6 (six) hours as needed for moderate pain. (Patient not taking: Reported on 04/16/2018), Disp: 12 tablet, Rfl: 0 .  insulin aspart (NOVOLOG FLEXPEN) 100 UNIT/ML  FlexPen, Inject 10u sq prior to meals and 5u for cbg >250, Disp: 30 mL, Rfl: 1 .  Insulin Glargine (LANTUS SOLOSTAR) 100 UNIT/ML Solostar Pen, Inject 35 Units into the skin daily at 10 pm., Disp: 30 pen, Rfl: 1 .  Insulin Pen Needle (PEN NEEDLES) 32G X 4 MM MISC, 1 Units by Does not apply route daily., Disp: 400 each, Rfl: 11 .  isosorbide mononitrate (IMDUR) 30 MG 24 hr tablet, Take 3 tablets (90 mg total) by mouth daily., Disp: 90 tablet, Rfl: 11 .  lisinopril (PRINIVIL,ZESTRIL) 5 MG tablet, Take 1 tablet (5 mg total) by mouth at bedtime., Disp: 90 tablet, Rfl: 3 .  loratadine (CLARITIN) 10 MG tablet, Take 1 tablet (10 mg total) by mouth daily. (Patient taking differently: Take 10 mg by mouth daily as needed for allergies. ), Disp: 90 tablet, Rfl: 3 .  metoprolol tartrate (LOPRESSOR) 25 MG tablet, Take 1 tablet (25 mg total) by mouth 2 (two) times daily., Disp: 180 tablet, Rfl: 1 .  Multiple Vitamin (MULTIVITAMIN WITH MINERALS) TABS tablet, Take 1 tablet by mouth daily., Disp: , Rfl:  .  nitroGLYCERIN (NITROSTAT) 0.4 MG SL tablet, Place 1 tablet (0.4 mg total) under the tongue every 5 (five) minutes as needed for chest pain., Disp: 25 tablet, Rfl: 3 .  Omega-3 Fatty Acids (FISH OIL CONCENTRATE) 300 MG CAPS, Take 1 capsule by mouth daily., Disp: , Rfl:  .  oxyCODONE (OXY IR/ROXICODONE) 5  MG immediate release tablet, Take 1 tablet (5 mg total) by mouth every 8 (eight) hours as needed for severe pain. (Patient not taking: Reported on 04/16/2018), Disp: 15 tablet, Rfl: 0 .  predniSONE (DELTASONE) 10 MG tablet, Take 65m x 3 days; 324mx 3 days; 2039m 3 days; 37m39m3 days. (Patient not taking: Reported on 04/16/2018), Disp: 30 tablet, Rfl: 0 .  ranolazine (RANEXA) 500 MG 12 hr tablet, Take 1 tablet (500 mg total) by mouth 2 (two) times daily., Disp: 90 tablet, Rfl: 3 .  sertraline (ZOLOFT) 50 MG tablet, TAKE ONE TABLET BY MOUTH ONE TIME DAILY, Disp: 30 tablet, Rfl: 3 .  TURMERIC PO, Take 1 tablet by mouth  daily., Disp: , Rfl:  .  varenicline (CHANTIX STARTING MONTH PAK) 0.5 MG X 11 & 1 MG X 42 tablet, Take one 0.5 mg tab po qd x 3d, then increase to one 0.5 mg tab bid x 4 d, then increase to one 1 mg tab bid, Disp: 53 tablet, Rfl: 0 .  [START ON 05/23/2018] varenicline (CHANTIX) 0.5 MG tablet, Take 1 tablet (0.5 mg total) by mouth 2 (two) times daily., Disp: 60 tablet, Rfl: 0 .  zolpidem (AMBIEN) 5 MG tablet, Take 1 tablet (5 mg total) by mouth at bedtime as needed for sleep., Disp: 30 tablet, Rfl: 0  Past Medical History: Past Medical History:  Diagnosis Date  . Anxiety   . Arthritis    "maybe in my left foot" (10/14/2017)  . Bladder prolapse, female, acquired 09/14/2017  . Depression   . High cholesterol   . History of stomach ulcers   . Hypertension   . Myocardial infarction (HCC)[place]/06/2005  . Tobacco abuse   . Type II diabetes mellitus (HCC)     Tobacco Use: Social History   Tobacco Use  Smoking Status Current Every Day Smoker  . Packs/day: 0.12  . Years: 45.00  . Pack years: 5.40  . Types: Cigarettes  . Start date: 05/01/1972  . Last attempt to quit: 09/30/2017  . Years since quitting: 0.5  Smokeless Tobacco Never Used    Labs: Recent Review Flowsheet Data    Labs for ITP Cardiac and Pulmonary Rehab Latest Ref Rng & Units 10/19/2017 10/19/2017 10/19/2017 10/20/2017 12/07/2017   Cholestrol 0 - 200 mg/dL - - - - -   LDLCALC 0 - 99 mg/dL - - - - -   HDL >40 mg/dL - - - - -   Trlycerides <150 mg/dL - - - - -   Hemoglobin A1c - - - - - 8.6   PHART 7.350 - 7.450 7.336(L) 7.308(L) - - -   PCO2ART 32.0 - 48.0 mmHg 43.5 46.1 - - -   HCO3 20.0 - 28.0 mmol/L 23.5 23.3 - - -   TCO2 22 - 32 mmol/L 25 25 23 22  -   ACIDBASEDEF 0.0 - 2.0 mmol/L 2.0 3.0(H) - - -   O2SAT % 99.0 98.0 - - -      Capillary Blood Glucose: Lab Results  Component Value Date   GLUCAP 105 (H) 04/28/2018   GLUCAP 160 (H) 04/28/2018   GLUCAP 226 (H) 02/10/2018   GLUCAP 134 (H) 02/10/2018   GLUCAP 272 (H)  02/09/2018     Exercise Target Goals:    Exercise Program Goal: Individual exercise prescription set using results from initial 6 min walk test and THRR while considering  patient's activity barriers and safety.   Exercise Prescription Goal: Initial exercise prescription builds  to 30-45 minutes a day of aerobic activity, 2-3 days per week.  Home exercise guidelines will be given to patient during program as part of exercise prescription that the participant will acknowledge.  Activity Barriers & Risk Stratification: Activity Barriers & Cardiac Risk Stratification - 04/22/18 1431      Activity Barriers & Cardiac Risk Stratification   Activity Barriers  Other (comment);Muscular Weakness;Deconditioning;Back Problems;Joint Problems;Arthritis    Comments  B hip pain, B shoulder pain     Cardiac Risk Stratification  High       6 Minute Walk: 6 Minute Walk    Row Name 04/22/18 1554 04/22/18 1610       6 Minute Walk   Phase  Initial  -    Distance  1437 feet  -    Walk Time  6 minutes  -    # of Rest Breaks  0  -    MPH  2.7  -    METS  3.6  -    RPE  14  -    VO2 Peak  12.67  -    Symptoms  No  Yes (comment) Patient experienced b/l leg and hip pain resolved with rest    Resting HR  68 bpm  -    Resting BP  108/72  -    Resting Oxygen Saturation   95 %  -    Exercise Oxygen Saturation  during 6 min walk  97 %  -    Max Ex. HR  98 bpm  -    Max Ex. BP  142/64  -    2 Minute Post BP  114/82  -       Oxygen Initial Assessment:   Oxygen Re-Evaluation:   Oxygen Discharge (Final Oxygen Re-Evaluation):   Initial Exercise Prescription: Initial Exercise Prescription - 04/22/18 1500      Date of Initial Exercise RX and Referring Provider   Date  04/22/18    Referring Provider  Lyman Bishop MD      Treadmill   MPH  2.5    Grade  0    Minutes  10    METs  2.91      Bike   Level  0.8    Minutes  10    METs  2.78      NuStep   Level  3    SPM  80    Minutes   10    METs  2      Prescription Details   Frequency (times per week)  3    Duration  Progress to 30 minutes of continuous aerobic without signs/symptoms of physical distress      Intensity   THRR 40-80% of Max Heartrate  64-129    Ratings of Perceived Exertion  11-13    Perceived Dyspnea  0-4      Progression   Progression  Continue to progress workloads to maintain intensity without signs/symptoms of physical distress.      Resistance Training   Training Prescription  Yes    Weight  2lbs    Reps  10-15       Perform Capillary Blood Glucose checks as needed.  Exercise Prescription Changes:  Exercise Prescription Changes    Row Name 04/28/18 0950             Response to Exercise   Blood Pressure (Admit)  114/70       Blood Pressure (Exercise)  150/80  Blood Pressure (Exit)  122/88       Heart Rate (Admit)  70 bpm       Heart Rate (Exercise)  91 bpm       Heart Rate (Exit)  69 bpm       Rating of Perceived Exertion (Exercise)  13       Symptoms  none       Duration  Progress to 30 minutes of  aerobic without signs/symptoms of physical distress       Intensity  THRR unchanged         Progression   Progression  Continue to progress workloads to maintain intensity without signs/symptoms of physical distress.       Average METs  2.5         Resistance Training   Training Prescription  No Relaxation day, no weights.         Interval Training   Interval Training  No         Treadmill   MPH  -       Grade  -       Minutes  -       METs  -         Bike   Level  0.8       Minutes  10       METs  2.77         NuStep   Level  3       SPM  80       Minutes  10       METs  2.3         Track   Laps  8       Minutes  10       METs  2.39          Exercise Comments:  Exercise Comments    Row Name 04/28/18 1030           Exercise Comments  Reviewed METs with patient.          Exercise Goals and Review:  Exercise Goals    Row Name 04/22/18  1431             Exercise Goals   Increase Physical Activity  Yes       Intervention  Provide advice, education, support and counseling about physical activity/exercise needs.;Develop an individualized exercise prescription for aerobic and resistive training based on initial evaluation findings, risk stratification, comorbidities and participant's personal goals.       Expected Outcomes  Short Term: Attend rehab on a regular basis to increase amount of physical activity.;Long Term: Exercising regularly at least 3-5 days a week.;Long Term: Add in home exercise to make exercise part of routine and to increase amount of physical activity.       Increase Strength and Stamina  Yes       Intervention  Provide advice, education, support and counseling about physical activity/exercise needs.;Develop an individualized exercise prescription for aerobic and resistive training based on initial evaluation findings, risk stratification, comorbidities and participant's personal goals.       Expected Outcomes  Short Term: Increase workloads from initial exercise prescription for resistance, speed, and METs.;Short Term: Perform resistance training exercises routinely during rehab and add in resistance training at home;Long Term: Improve cardiorespiratory fitness, muscular endurance and strength as measured by increased METs and functional capacity (6MWT)       Able to understand and use rate of  perceived exertion (RPE) scale  Yes       Intervention  Provide education and explanation on how to use RPE scale       Expected Outcomes  Short Term: Able to use RPE daily in rehab to express subjective intensity level;Long Term:  Able to use RPE to guide intensity level when exercising independently       Knowledge and understanding of Target Heart Rate Range (THRR)  Yes       Intervention  Provide education and explanation of THRR including how the numbers were predicted and where they are located for reference        Expected Outcomes  Short Term: Able to state/look up THRR;Long Term: Able to use THRR to govern intensity when exercising independently;Short Term: Able to use daily as guideline for intensity in rehab       Able to check pulse independently  Yes       Intervention  Provide education and demonstration on how to check pulse in carotid and radial arteries.;Review the importance of being able to check your own pulse for safety during independent exercise       Expected Outcomes  Short Term: Able to explain why pulse checking is important during independent exercise;Long Term: Able to check pulse independently and accurately       Understanding of Exercise Prescription  Yes       Intervention  Provide education, explanation, and written materials on patient's individual exercise prescription       Expected Outcomes  Short Term: Able to explain program exercise prescription;Long Term: Able to explain home exercise prescription to exercise independently          Exercise Goals Re-Evaluation : Exercise Goals Re-Evaluation    Row Name 04/28/18 1030             Exercise Goal Re-Evaluation   Exercise Goals Review  Able to understand and use rate of perceived exertion (RPE) scale;Increase Physical Activity       Comments  Patient tolerated first session of exercise well. Pt able to understand and use the RPE scale appropriately.       Expected Outcomes  Increase workloads as tolerated.           Discharge Exercise Prescription (Final Exercise Prescription Changes): Exercise Prescription Changes - 04/28/18 0950      Response to Exercise   Blood Pressure (Admit)  114/70    Blood Pressure (Exercise)  150/80    Blood Pressure (Exit)  122/88    Heart Rate (Admit)  70 bpm    Heart Rate (Exercise)  91 bpm    Heart Rate (Exit)  69 bpm    Rating of Perceived Exertion (Exercise)  13    Symptoms  none    Duration  Progress to 30 minutes of  aerobic without signs/symptoms of physical distress     Intensity  THRR unchanged      Progression   Progression  Continue to progress workloads to maintain intensity without signs/symptoms of physical distress.    Average METs  2.5      Resistance Training   Training Prescription  No   Relaxation day, no weights.     Interval Training   Interval Training  No      Treadmill   MPH  --    Grade  --    Minutes  --    METs  --      Bike   Level  0.8  Minutes  10    METs  2.77      NuStep   Level  3    SPM  80    Minutes  10    METs  2.3      Track   Laps  8    Minutes  10    METs  2.39       Nutrition:  Target Goals: Understanding of nutrition guidelines, daily intake of sodium <1570m, cholesterol <2076m calories 30% from fat and 7% or less from saturated fats, daily to have 5 or more servings of fruits and vegetables.  Biometrics:  Post Biometrics - 04/22/18 1543       Post  Biometrics   Height  5' 6"  (1.676 m)    Weight  188 lb 4.4 oz (85.4 kg)    Waist Circumference  38.25 inches    Hip Circumference  43.5 inches    Waist to Hip Ratio  0.88 %    BMI (Calculated)  30.4    Triceps Skinfold  41 mm    Grip Strength  28 kg    Flexibility  15 in    Single Leg Stand  17.44 seconds       Nutrition Therapy Plan and Nutrition Goals: Nutrition Therapy & Goals - 04/22/18 1434      Nutrition Therapy   Diet  consistent carbohydrate heart healthy      Personal Nutrition Goals   Nutrition Goal  Pt to identify food quantities necessary to achieve weight loss of 6-24 lbs. at graduation from cardiac rehab.    Personal Goal #2  Pt to identify and limit food sources of saturated fat, trans fat, and sodium    Personal Goal #3  Improved blood glucose control as evidenced by pt's A1c trending from 8.5 toward less than 7.0.      Intervention Plan   Intervention  Prescribe, educate and counsel regarding individualized specific dietary modifications aiming towards targeted core components such as weight, hypertension, lipid  management, diabetes, heart failure and other comorbidities.    Expected Outcomes  Short Term Goal: Understand basic principles of dietary content, such as calories, fat, sodium, cholesterol and nutrients.       Nutrition Assessments: Nutrition Assessments - 04/22/18 1435      MEDFICTS Scores   Pre Score  67       Nutrition Goals Re-Evaluation:   Nutrition Goals Re-Evaluation:   Nutrition Goals Discharge (Final Nutrition Goals Re-Evaluation):   Psychosocial: Target Goals: Acknowledge presence or absence of significant depression and/or stress, maximize coping skills, provide positive support system. Participant is able to verbalize types and ability to use techniques and skills needed for reducing stress and depression.  Initial Review & Psychosocial Screening: Initial Psych Review & Screening - 04/22/18 1437      Initial Review   Current issues with  Current Stress Concerns    Source of Stress Concerns  Chronic Illness;Family;Financial    Comments  Pt reports stressors related to her recent CV events and diabetes.  Her husband has been ill as well.  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 Her husband has applied for disability as well.       Family Dynamics   Good Support System?  Yes   CaEnasists her husband, daughters, sister, and friends as sources of support.      Barriers   Psychosocial barriers to participate in program  Psychosocial barriers identified (see note);The patient should benefit from training in stress  management and relaxation.   Barriers as noted above.      Screening Interventions   Interventions  Encouraged to exercise;To provide support and resources with identified psychosocial needs;Provide feedback about the scores to participant   Pt offered to meet with spiritual care department and counseling.  Pt declined at this time.    Expected Outcomes  Short Term goal: Identification and review with participant of any Quality of Life or  Depression concerns found by scoring the questionnaire.;Long Term goal: The participant improves quality of Life and PHQ9 Scores as seen by post scores and/or verbalization of changes       Quality of Life Scores: Quality of Life - 04/22/18 1541      Quality of Life   Select  Quality of Life      Quality of Life Scores   Health/Function Pre  9.97 %    Socioeconomic Pre  22.07 %    Psych/Spiritual Pre  11.36 %    Family Pre  15.4 %    GLOBAL Pre  13.54 %      Scores of 19 and below usually indicate a poorer quality of life in these areas.  A difference of  2-3 points is a clinically meaningful difference.  A difference of 2-3 points in the total score of the Quality of Life Index has been associated with significant improvement in overall quality of life, self-image, physical symptoms, and general health in studies assessing change in quality of life.  PHQ-9: Recent Review Flowsheet Data    Depression screen [facility] 2/9 04/28/2018 03/09/2018 03/02/2018 01/07/2018 12/16/2017   Decreased Interest - 2 3 0 1   Down, Depressed, Hopeless 1 3 3  0 1   PHQ - 2 Score 1 5 6  0 2   Altered sleeping - 3  0 - 0   Tired, decreased energy - 3 2 - 2   Change in appetite - 1 3 - 1   Feeling bad or failure about yourself  - 1 2 - 2   Trouble concentrating - 3 3 - 2   Moving slowly or fidgety/restless - 0 0 - 0   Suicidal thoughts - 0 0 - 0   PHQ-9 Score - 16 16 - 9   Difficult doing work/chores - Very difficult Very difficult - Somewhat difficult     Interpretation of Total Score  Total Score Depression Severity:  1-4 = Minimal depression, 5-9 = Mild depression, 10-14 = Moderate depression, 15-19 = Moderately severe depression, 20-27 = Severe depression   Psychosocial Evaluation and Intervention:   Psychosocial Re-Evaluation:   Psychosocial Discharge (Final Psychosocial Re-Evaluation):   Vocational Rehabilitation: Provide vocational rehab assistance to qualifying candidates.   Vocational Rehab  Evaluation & Intervention: Vocational Rehab - 04/22/18 1437      Initial Vocational Rehab Evaluation & Intervention   Assessment shows need for Vocational Rehabilitation  No       Education: Education Goals: Education classes will be provided on a weekly basis, covering required topics. Participant will state understanding/return demonstration of topics presented.  Learning Barriers/Preferences: Learning Barriers/Preferences - 04/22/18 1430      Learning Barriers/Preferences   Learning Barriers  Sight    Learning Preferences  Skilled Demonstration       Education Topics: Count Your Pulse:  -Group instruction provided by verbal instruction, demonstration, patient participation and written materials to support subject.  Instructors address importance of being able to find your pulse and how to count your pulse when at home  without a heart monitor.  Patients get hands on experience counting their pulse with staff help and individually.   Heart Attack, Angina, and Risk Factor Modification:  -Group instruction provided by verbal instruction, video, and written materials to support subject.  Instructors address signs and symptoms of angina and heart attacks.    Also discuss risk factors for heart disease and how to make changes to improve heart health risk factors.   Functional Fitness:  -Group instruction provided by verbal instruction, demonstration, patient participation, and written materials to support subject.  Instructors address safety measures for doing things around the house.  Discuss how to get up and down off the floor, how to pick things up properly, how to safely get out of a chair without assistance, and balance training.   Meditation and Mindfulness:  -Group instruction provided by verbal instruction, patient participation, and written materials to support subject.  Instructor addresses importance of mindfulness and meditation practice to help reduce stress and improve  awareness.  Instructor also leads participants through a meditation exercise.    Stretching for Flexibility and Mobility:  -Group instruction provided by verbal instruction, patient participation, and written materials to support subject.  Instructors lead participants through series of stretches that are designed to increase flexibility thus improving mobility.  These stretches are additional exercise for major muscle groups that are typically performed during regular warm up and cool down.   Hands Only CPR:  -Group verbal, video, and participation provides a basic overview of AHA guidelines for community CPR. Role-play of emergencies allow participants the opportunity to practice calling for help and chest compression technique with discussion of AED use.   Hypertension: -Group verbal and written instruction that provides a basic overview of hypertension including the most recent diagnostic guidelines, risk factor reduction with self-care instructions and medication management.    Nutrition I class: Heart Healthy Eating:  -Group instruction provided by PowerPoint slides, verbal discussion, and written materials to support subject matter. The instructor gives an explanation and review of the Therapeutic Lifestyle Changes diet recommendations, which includes a discussion on lipid goals, dietary fat, sodium, fiber, plant stanol/sterol esters, sugar, and the components of a well-balanced, healthy diet.   Nutrition II class: Lifestyle Skills:  -Group instruction provided by PowerPoint slides, verbal discussion, and written materials to support subject matter. The instructor gives an explanation and review of label reading, grocery shopping for heart health, heart healthy recipe modifications, and ways to make healthier choices when eating out.   Diabetes Question & Answer:  -Group instruction provided by PowerPoint slides, verbal discussion, and written materials to support subject matter. The  instructor gives an explanation and review of diabetes co-morbidities, pre- and post-prandial blood glucose goals, pre-exercise blood glucose goals, signs, symptoms, and treatment of hypoglycemia and hyperglycemia, and foot care basics.   Diabetes Blitz:  -Group instruction provided by PowerPoint slides, verbal discussion, and written materials to support subject matter. The instructor gives an explanation and review of the physiology behind type 1 and type 2 diabetes, diabetes medications and rational behind using different medications, pre- and post-prandial blood glucose recommendations and Hemoglobin A1c goals, diabetes diet, and exercise including blood glucose guidelines for exercising safely.    Portion Distortion:  -Group instruction provided by PowerPoint slides, verbal discussion, written materials, and food models to support subject matter. The instructor gives an explanation of serving size versus portion size, changes in portions sizes over the last 20 years, and what consists of a serving from each food group.  Stress Management:  -Group instruction provided by verbal instruction, video, and written materials to support subject matter.  Instructors review role of stress in heart disease and how to cope with stress positively.     Exercising on Your Own:  -Group instruction provided by verbal instruction, power point, and written materials to support subject.  Instructors discuss benefits of exercise, components of exercise, frequency and intensity of exercise, and end points for exercise.  Also discuss use of nitroglycerin and activating EMS.  Review options of places to exercise outside of rehab.  Review guidelines for sex with heart disease.   Cardiac Drugs I:  -Group instruction provided by verbal instruction and written materials to support subject.  Instructor reviews cardiac drug classes: antiplatelets, anticoagulants, beta blockers, and statins.  Instructor discusses  reasons, side effects, and lifestyle considerations for each drug class.   Cardiac Drugs II:  -Group instruction provided by verbal instruction and written materials to support subject.  Instructor reviews cardiac drug classes: angiotensin converting enzyme inhibitors (ACE-I), angiotensin II receptor blockers (ARBs), nitrates, and calcium channel blockers.  Instructor discusses reasons, side effects, and lifestyle considerations for each drug class.   Anatomy and Physiology of the Circulatory System:  Group verbal and written instruction and models provide basic cardiac anatomy and physiology, with the coronary electrical and arterial systems. Review of: AMI, Angina, Valve disease, Heart Failure, Peripheral Artery Disease, Cardiac Arrhythmia, Pacemakers, and the ICD.   Other Education:  -Group or individual verbal, written, or video instructions that support the educational goals of the cardiac rehab program.   Holiday Eating Survival Tips:  -Group instruction provided by PowerPoint slides, verbal discussion, and written materials to support subject matter. The instructor gives patients tips, tricks, and techniques to help them not only survive but enjoy the holidays despite the onslaught of food that accompanies the holidays.   Knowledge Questionnaire Score: Knowledge Questionnaire Score - 04/22/18 1437      Knowledge Questionnaire Score   Pre Score  26/28       Core Components/Risk Factors/Patient Goals at Admission: Personal Goals and Risk Factors at Admission - 04/22/18 1619      Core Components/Risk Factors/Patient Goals on Admission    Weight Management  Yes;Obesity;Weight Maintenance;Weight Loss    Intervention  Weight Management: Develop a combined nutrition and exercise program designed to reach desired caloric intake, while maintaining appropriate intake of nutrient and fiber, sodium and fats, and appropriate energy expenditure required for the weight goal.;Weight  Management: Provide education and appropriate resources to help participant work on and attain dietary goals.;Weight Management/Obesity: Establish reasonable short term and long term weight goals.;Obesity: Provide education and appropriate resources to help participant work on and attain dietary goals.    Admit Weight  188 lb 4.4 oz (85.4 kg)    Goal Weight: Short Term  180 lb (81.6 kg)    Goal Weight: Long Term  158 lb (71.7 kg)    Expected Outcomes  Short Term: Continue to assess and modify interventions until short term weight is achieved;Long Term: Adherence to nutrition and physical activity/exercise program aimed toward attainment of established weight goal;Weight Maintenance: Understanding of the daily nutrition guidelines, which includes 25-35% calories from fat, 7% or less cal from saturated fats, less than 249m cholesterol, less than 1.5gm of sodium, & 5 or more servings of fruits and vegetables daily;Weight Loss: Understanding of general recommendations for a balanced deficit meal plan, which promotes 1-2 lb weight loss per week and includes a negative energy balance of  [phone] kcal/d;Understanding recommendations for meals to include 15-35% energy as protein, 25-35% energy from fat, 35-60% energy from carbohydrates, less than 251m of dietary cholesterol, 20-35 gm of total fiber daily;Understanding of distribution of calorie intake throughout the day with the consumption of 4-5 meals/snacks    Diabetes  Yes    Intervention  Provide education about signs/symptoms and action to take for hypo/hyperglycemia.;Provide education about proper nutrition, including hydration, and aerobic/resistive exercise prescription along with prescribed medications to achieve blood glucose in normal ranges: Fasting glucose 65-99 mg/dL    Expected Outcomes  Short Term: Participant verbalizes understanding of the signs/symptoms and immediate care of hyper/hypoglycemia, proper foot care and importance of medication,  aerobic/resistive exercise and nutrition plan for blood glucose control.;Long Term: Attainment of HbA1C < 7%.    Hypertension  Yes    Intervention  Provide education on lifestyle modifcations including regular physical activity/exercise, weight management, moderate sodium restriction and increased consumption of fresh fruit, vegetables, and low fat dairy, alcohol moderation, and smoking cessation.;Monitor prescription use compliance.    Expected Outcomes  Short Term: Continued assessment and intervention until BP is < 140/973mHG in hypertensive participants. < 130/8041mG in hypertensive participants with diabetes, heart failure or chronic kidney disease.;Long Term: Maintenance of blood pressure at goal levels.    Stress  Yes    Intervention  Offer individual and/or small group education and counseling on adjustment to heart disease, stress management and health-related lifestyle change. Teach and support self-help strategies.;Refer participants experiencing significant psychosocial distress to appropriate mental health specialists for further evaluation and treatment. When possible, include family members and significant others in education/counseling sessions.    Expected Outcomes  Short Term: Participant demonstrates changes in health-related behavior, relaxation and other stress management skills, ability to obtain effective social support, and compliance with psychotropic medications if prescribed.;Long Term: Emotional wellbeing is indicated by absence of clinically significant psychosocial distress or social isolation.       Core Components/Risk Factors/Patient Goals Review:    Core Components/Risk Factors/Patient Goals at Discharge (Final Review):    ITP Comments: ITP Comments    Row Name 04/22/18 1426           ITP Comments  Dr. TraFransico Himedical Director          Comments: Today was Ketina's first day of exercise at cardiac rehab.MarBarnet PallN,BSN 04/29/2018 3:27 PM

## 2018-04-28 NOTE — Progress Notes (Signed)
Mohogany Toppins 57 y.o. female DOB: [birth year] MRN: [id number]      Nutrition Note  1. 10/19/17 S/P CABG x 4   2. 12/24/17 NSTEMI (non-ST elevated myocardial infarction) [facility])    Past Medical History:  Diagnosis Date  . Anxiety   . Arthritis    "maybe in my left foot" (10/14/2017)  . Bladder prolapse, female, acquired 09/14/2017  . Depression   . High cholesterol   . History of stomach ulcers   . Hypertension   . Myocardial infarction ([place]) 12/30/2004  . Tobacco abuse   . Type II diabetes mellitus ([place])    Meds reviewed. Lipitor, novolog, lantus, lopressor, MVI, omega's, Tumeric noted  HT: Ht Readings from Last 1 Encounters:  04/22/18 5' 6"  (1.676 m)    WT: Wt Readings from Last 5 Encounters:  04/22/18 188 lb 4.4 oz (85.4 kg)  03/09/18 179 lb 12.8 oz (81.6 kg)  03/04/18 177 lb (80.3 kg)  03/02/18 177 lb 6.4 oz (80.5 kg)  02/17/18 170 lb 6.4 oz (77.3 kg)     There is no height or weight on file to calculate BMI.   Current tobacco use? No       Labs:  Lipid Panel     Component Value Date/Time   CHOL 137 10/10/2017 1848   CHOL 175 05/01/2017 0939   TRIG 144 10/10/2017 1848   HDL 38 (L) 10/10/2017 1848   HDL 53 05/01/2017 0939   CHOLHDL 3.6 10/10/2017 1848   VLDL 29 10/10/2017 1848   LDLCALC 70 10/10/2017 1848   LDLCALC 86 05/01/2017 0939    Lab Results  Component Value Date   HGBA1C 8.6 12/07/2017   CBG (last 3)  No results for input(s): GLUCAP in the last 72 hours.  Nutrition Note Spoke with pt. Nutrition plan and goals reviewed with pt. Pt is following Step 1 of the Therapeutic Lifestyle Changes diet. Pt wants to lose wt. Pt has not actively been trying to lose wt. Reinforced wt loss tips reviewed in orientation (eating frequently across the day, portion size, how to build a healthy plate, limiting sweets/refined carbohydrates). Pt is diabetic. Last A1c indicates blood glucose not well-controlled. Pt checks CBG's 3-4x a day. Fasting CBG's reportedly 90's-150's  mg/dL. Redducated pt on consistent carbohydrate heart healthy diet. 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 Will continue to use motivational interviewing to help pt identify barriers to her goals. Per discussion, pt does not use canned/convenience foods often. Pt rarely adds salt to food. Pt eats out infrequently. Pt expressed understanding of the information reviewed. Pt aware of nutrition education classes offered and  plans on attending nutrition classes.   Nutrition Diagnosis ? Food-and nutrition-related knowledge deficit related to lack of exposure to information as related to diagnosis of: ? CVD ? DM  ? Obesity related to excessive energy intake as evidenced by a Body mass index is 30.21 kg/m.   Nutrition Intervention ? Pt's individual nutrition plan and goals reviewed with pt. ? Pt given handouts for: ? Nutrition I class ? Nutrition II class  ? Diabetes Blitz Class ? Diabetes Q & A class  ? Consistent vit K diet ? low sodium ? DM ? pre-diabetes  Nutrition Goal(s):   ? Pt to identify and limit food sources of saturated fat, trans fat, and sodium ?  Pt to identify food quantities necessary to achieve weight loss of 6-24 lbs. at graduation from cardiac rehab. ? Improved blood glucose control as evidenced by pt's A1c trending from 8.6 toward less than 7.0   Plan:  ? Pt to attend nutrition classes ? Nutrition I ? Nutrition II ? Portion Distortion ? Diabetes Blitz ? Diabetes Q & Ae determined ? Will provide client-centered nutrition education as part of interdisciplinary care ? Monitor and evaluate progress toward nutrition goal with team.   Laurina Bustle, MS, RD, LDN 04/28/2018 10:45 AM

## 2018-04-29 ENCOUNTER — Ambulatory Visit (INDEPENDENT_AMBULATORY_CARE_PROVIDER_SITE_OTHER): Payer: BLUE CROSS/BLUE SHIELD | Admitting: Physician Assistant

## 2018-04-29 ENCOUNTER — Encounter: Payer: Self-pay | Admitting: Physician Assistant

## 2018-04-29 VITALS — BP 126/74 | HR 77 | Ht 66.0 in | Wt 185.0 lb

## 2018-04-29 DIAGNOSIS — E119 Type 2 diabetes mellitus without complications: Secondary | ICD-10-CM | POA: Diagnosis not present

## 2018-04-29 DIAGNOSIS — I25708 Atherosclerosis of coronary artery bypass graft(s), unspecified, with other forms of angina pectoris: Secondary | ICD-10-CM

## 2018-04-29 DIAGNOSIS — E785 Hyperlipidemia, unspecified: Secondary | ICD-10-CM

## 2018-04-29 DIAGNOSIS — I10 Essential (primary) hypertension: Secondary | ICD-10-CM

## 2018-04-29 NOTE — Progress Notes (Signed)
Cardiology Office Note    Date:  05/01/2018   ID:  Victoria Eaton, DOB 12-10-1960, MRN [id number]  PCP:  Shawnee Knapp, MD  Cardiologist:  Dr. Debara Pickett  Chief Complaint  Patient presents with  . Follow-up    seen for Dr. Debara Pickett.     History of Present Illness:  Victoria Eaton is a 57 y.o. female with past medical history of CAD, type II DM, hypertension, hyperlipidemia and depression.  She had a remote RCA stent.  In January 2019, she underwent cardiac catheterization which showed multivessel disease and subsequently underwent CABG x4 (LIMA to LAD, reverse SVG to D1, reverse SVG to OM, reverse SVG to distal RCA.  Postoperative course was complicated by small pleural effusion and left apical pneumothorax on x-ray.  She was restarted on lisinopril and amlodipine in the setting of hypertension.  For post hospital follow-up, patient was seen by Lucretia Eaton on 11/10/2017, she was still very fatigued at that time.  Patient was admitted to the hospital on 01/01/2018 with sudden onset of chest pressure radiating to the left jaw and down left arm.  Symptom was concerning for unstable angina, therefore patient under went to relook cardiac catheterization which showed patent LIMA to LAD with severe disease in the proximal LAD area, 85% distal LAD after the insertion of LIMA graft.  SVG to the diagonal was occluded, ostial diagonal had 80% lesion, SVG to OM was occluded with 100% ostial OM lesion, SVG to PDA was widely patent and provide collaterals to the occluded OM.  Both the occluded OM and the diagonal were small vessels.  Although CTO PCI of OM can be consider however given the small caliber of the vessel, there may be long-term patency issue with the DES.  Apparently she was on 81 mg instead of 325 mg of aspirin after bypass graft.  I last saw the patient on 01/19/2018, she was still having intermittent chest discomfort.  I decreased her lisinopril and increase her Imdur.  Unfortunately she was  readmitted in May with NSTEMI, troponin peaked to 3.2.  No further repeat cath was recommended given the recent cardiac catheterization.  Imdur was further increased to 90 mg.  Amlodipine increased to 10 mg, while lisinopril decreased to 5 mg daily.  She was seen by Victoria Eaton on 02/17/2018, Ranexa 500 mg twice daily was added to her medical regimen.  Patient presents today for cardiology office visit.  She has been doing relatively well for the past 2 months.  She still has occasional chest pain, however it is fairly transient and goes away with nitroglycerin.  In the past week she has only taken one nitroglycerin, this is significantly improved compared to earlier this year.  She is also doing more activity and housework at home.  Overall she is feeling well without lower extremity edema, orthopnea or PND.  I will continue her on the current regiment of medication.  She will follow-up with Dr. Debara Pickett in 3 to 53-month    Past Medical History:  Diagnosis Date  . Anxiety   . Arthritis    "maybe in my left foot" (10/14/2017)  . Bladder prolapse, female, acquired 09/14/2017  . Depression   . High cholesterol   . History of stomach ulcers   . Hypertension   . [place]ocardial infarction ([place] 12/30/2004  . Tobacco abuse   . [place] diabetes [place]     Past Surgical History:  Procedure Laterality Date  . CARDIAC CATHETERIZATION  10/14/2017  .  CORONARY ANGIOPLASTY WITH STENT PLACEMENT  12/30/2004   Patient reported  . CORONARY ARTERY BYPASS GRAFT N/A 10/19/2017   Procedure: CORONARY ARTERY BYPASS GRAFTING (CABG) times four using the right saphaneous vein. Harvested endoscopicly and left internal mammary artery.;  Surgeon: Grace Isaac, MD;  Location: [facility];  Service: Open Heart Surgery;  Laterality: N/A;  . DILATION AND CURETTAGE OF UTERUS  1980s  . LEFT HEART CATH AND CORONARY ANGIOGRAPHY N/A 10/14/2017   Procedure: LEFT HEART CATH AND CORONARY ANGIOGRAPHY;  Surgeon: Martinique, Peter M, MD;   Location: [facility];  Service: Cardiovascular;  Laterality: N/A;  . LEFT HEART CATH AND CORS/GRAFTS ANGIOGRAPHY N/A 01/01/2018   Procedure: LEFT HEART CATH AND CORS/GRAFTS ANGIOGRAPHY;  Surgeon: Jettie Booze, MD;  Location: [facility];  Service: Cardiovascular;  Laterality: N/A;  . PILONIDAL CYST EXCISION  1980s  . TEE WITHOUT CARDIOVERSION N/A 10/19/2017   Procedure: TRANSESOPHAGEAL ECHOCARDIOGRAM (TEE);  Surgeon: Grace Isaac, MD;  Location: [facility];  Service: Open Heart Surgery;  Laterality: N/A;    Current Medications: Outpatient Medications Prior to Visit  Medication Sig Dispense Refill  . amLODipine (NORVASC) 10 MG tablet Take 0.5 tablets (5 mg total) by mouth daily. 90 tablet 3  . aspirin 81 MG EC tablet Take 1 tablet (81 mg total) by mouth daily. 90 tablet 3  . atorvastatin (LIPITOR) 80 MG tablet Take 1 tablet (80 mg total) by mouth daily at 6 PM. 90 tablet 1  . buPROPion (WELLBUTRIN XL) 150 MG 24 hr tablet TAKE ONE TABLET BY MOUTH ONE TIME DAILY 90 tablet 0  . canagliflozin (INVOKANA) 300 MG TABS tablet Take 1 tablet (300 mg total) by mouth daily before breakfast. 30 tablet 5  . clopidogrel (PLAVIX) 75 MG tablet Take 1 tablet (75 mg total) by mouth daily. 90 tablet 3  . Continuous Blood Gluc Sensor (FREESTYLE LIBRE 14 DAY SENSOR) MISC 2 Devices by Does not apply route every morning. 2 each 5  . EPINEPHrine (EPIPEN 2-PAK) 0.3 mg/0.3 mL IJ SOAJ injection Inject 0.3 mLs (0.3 mg total) into the muscle once as needed. (Patient taking differently: Inject 0.3 mg into the muscle once as needed (for an allergic reaction). ) 1 Device 1  . HYDROcodone-acetaminophen (NORCO) 5-325 MG tablet Take 1 tablet by mouth every 6 (six) hours as needed for moderate pain. 12 tablet 0  . insulin aspart (NOVOLOG FLEXPEN) 100 UNIT/ML FlexPen Inject 10u sq prior to meals and 5u for cbg >250 30 mL 1  . Insulin Glargine (LANTUS SOLOSTAR) 100 UNIT/ML Solostar Pen Inject 35 Units into the  skin daily at 10 pm. 30 pen 1  . Insulin Pen Needle (PEN NEEDLES) 32G X 4 MM MISC 1 Units by Does not apply route daily. 400 each 11  . isosorbide mononitrate (IMDUR) 30 MG 24 hr tablet Take 3 tablets (90 mg total) by mouth daily. 90 tablet 11  . lisinopril (PRINIVIL,ZESTRIL) 5 MG tablet Take 1 tablet (5 mg total) by mouth at bedtime. 90 tablet 3  . loratadine (CLARITIN) 10 MG tablet Take 1 tablet (10 mg total) by mouth daily. (Patient taking differently: Take 10 mg by mouth daily as needed for allergies. ) 90 tablet 3  . metoprolol tartrate (LOPRESSOR) 25 MG tablet Take 1 tablet (25 mg total) by mouth 2 (two) times daily. 180 tablet 1  . Multiple Vitamin (MULTIVITAMIN WITH MINERALS) TABS tablet Take 1 tablet by mouth daily.    . Omega-3 Fatty Acids (FISH  OIL CONCENTRATE) 300 MG CAPS Take 1 capsule by mouth daily.    [name] oxyCODONE (OXY IR/ROXICODONE) 5 MG immediate release tablet Take 1 tablet (5 mg total) by mouth every 8 (eight) hours as needed for severe pain. 15 tablet 0  . predniSONE (DELTASONE) 10 MG tablet Take 73m x 3 days; 346mx 3 days; 2033m 3 days; 44m70m3 days. 30 tablet 0  . ranolazine (RANEXA) 500 MG 12 hr tablet Take 1 tablet (500 mg total) by mouth 2 (two) times daily. 90 tablet 3  . sertraline (ZOLOFT) 50 MG tablet TAKE ONE TABLET BY MOUTH ONE TIME DAILY 30 tablet 3  . TURMERIC PO Take 1 tablet by mouth daily.    . varenicline (CHANTIX STARTING MONTH PAK) 0.5 MG X 11 & 1 MG X 42 tablet Take one 0.5 mg tab po qd x 3d, then increase to one 0.5 mg tab bid x 4 d, then increase to one 1 mg tab bid 53 tablet 0  . [START ON 05/23/2018] varenicline (CHANTIX) 0.5 MG tablet Take 1 tablet (0.5 mg total) by mouth 2 (two) times daily. 60 tablet 0  . zolpidem (AMBIEN) 5 MG tablet Take 1 tablet (5 mg total) by mouth at bedtime as needed for sleep. 30 tablet 0  . nitroGLYCERIN (NITROSTAT) 0.4 MG SL tablet Place 1 tablet (0.4 mg total) under the tongue every 5 (five) minutes as needed for chest pain.  25 tablet 3   No facility-administered medications prior to visit.      Allergies:   Januvia [sitagliptin]; Kiwi extract; Shrimp [shellfish allergy]; Metformin and related; and Penicillins   Social History   Socioeconomic History  . Marital status: Married    Spouse name: CarlGlendell DockerNumber of children: 1  . Years of education: Not on file  . Highest education level: Not on file  Occupational History  . Not on file  Social Needs  . Financial resource strain: Not on file  . Food insecurity:    Worry: Not on file    Inability: Not on file  . Transportation needs:    Medical: Not on file    Non-medical: Not on file  Tobacco Use  . Smoking status: Current Every Day Smoker    Packs/day: 0.12    Years: 45.00    Pack years: 5.40    Types: Cigarettes    Start date: 05/01/1972    Last attempt to quit: 09/30/2017    Years since quitting: 0.5  . Smokeless tobacco: Never Used  Substance and Sexual Activity  . Alcohol use: Yes    Alcohol/week: 14.0 standard drinks    Types: 14 Glasses of wine per week  . Drug use: No  . Sexual activity: Not Currently  Lifestyle  . Physical activity:    Days per week: Not on file    Minutes per session: Not on file  . Stress: Not on file  Relationships  . Social connections:    Talks on phone: Not on file    Gets together: Not on file    Attends religious service: Not on file    Active member of club or organization: Not on file    Attends meetings of clubs or organizations: Not on file    Relationship status: Not on file  Other Topics Concern  . Not on file  Social History Narrative  . Not on file     Family History:  The patient's family history includes Heart disease in her  father; Hyperlipidemia in her father; Hypertension in her father.   ROS:   Please see the history of present illness.    ROS All other systems reviewed and are negative.   PHYSICAL EXAM:   VS:  BP 126/74   Pulse 77   Ht 5' 6"  (1.676 m)   Wt 185 lb (83.9 kg)    SpO2 97%   BMI 29.86 kg/m    GEN: Well nourished, well developed, in no acute distress  HEENT: normal  Neck: no JVD, carotid bruits, or masses Cardiac: RRR; no murmurs, rubs, or gallops,no edema  Respiratory:  clear to auscultation bilaterally, normal work of breathing GI: soft, nontender, nondistended, + BS MS: no deformity or atrophy  Skin: warm and dry, no rash Neuro:  Alert and Oriented x 3, Strength and sensation are intact Psych: euthymic mood, full affect  Wt Readings from Last 3 Encounters:  04/29/18 185 lb (83.9 kg)  04/28/18 187 lb 2.7 oz (84.9 kg)  04/22/18 188 lb 4.4 oz (85.4 kg)      Studies/Labs Reviewed:   EKG:  EKG is not ordered today.    Recent Labs: 10/20/2017: Magnesium 2.0 12/07/2017: TSH 1.780 12/16/2017: ALT 20; B Natriuretic Peptide 87.9 02/08/2018: BUN 22; Creatinine, Ser 0.96; Potassium 3.9; Sodium 139 02/10/2018: Hemoglobin 11.1; Platelets 198   Lipid Panel    Component Value Date/Time   CHOL 137 10/10/2017 1848   CHOL 175 05/01/2017 0939   TRIG 144 10/10/2017 1848   HDL 38 (L) 10/10/2017 1848   HDL 53 05/01/2017 0939   CHOLHDL 3.6 10/10/2017 1848   VLDL 29 10/10/2017 1848   LDLCALC 70 10/10/2017 1848   LDLCALC 86 05/01/2017 0939    Additional studies/ records that were reviewed today include:   Cath 01/01/2018   Prox LAD to Mid LAD lesion is 90% stenosed. LIMA to LAD is patent.  Distal LAD to Dist LAD lesion is 85% stenosed, past the insertion of the LIMA.  Ost 1st Diag lesion is 80% stenosed. 1st Diag lesion is 90% stenosed. Lat 1st Diag lesion is 90% stenosed. SVG to diagonal is occluded.  Ost 1st Mrg to 1st Mrg lesion is 100% stenosed. SVG to OM is occluded.  Prox RCA lesion is 80% stenosed. Mid RCA to Dist RCA lesion is 70% stenosed. Dist RCA lesion is 70% stenosed. SVG to PDA is widely patent and provides collaterals to the occluded OM.  The left ventricular systolic function is normal.  LV end diastolic pressure is  normal.  The left ventricular ejection fraction is 55-65% by visual estimate.  There is no aortic valve stenosis.   Continue medical therapy.  The grafts to LAD and RCA are patent.    The SVGs to the OM and diagonal are occluded.  Both of these vessels were small in caliber.   Could consider CTO PCI attempt of the OM if she has refractory angina.  However, given how small in caliber the OM vessel is, there may be long term patency issues of a DES.     ASSESSMENT:    1. Coronary artery disease of bypass graft of native heart with stable angina pectoris ([place])   2. Essential hypertension   3. Controlled type 2 diabetes mellitus without complication, without long-term current use of insulin ([place])   4. Hyperlipidemia, unspecified hyperlipidemia type      PLAN:  In order of problems listed above:  1. Coronary artery disease s/p CABG: Unfortunately, she had 2 out of 4 bypass graft  showed down within 6 months after the bypass surgery.  At this point, she is on high-dose Imdur and amlodipine.  Ranexa was added during the last office visit.  Her chest pain seems to be stable at this point.  Continue aspirin and Plavix  2. Hypertension: Blood pressure stable on current medication  3. Hyperlipidemia: On Lipitor 80 mg daily.  Cholesterol well controlled on the previous lab work  4. DM2: Managed by primary care provider    Medication Adjustments/Labs and Tests Ordered: Current medicines are reviewed at length with the patient today.  Concerns regarding medicines are outlined above.  Medication changes, Labs and Tests ordered today are listed in the Patient Instructions below. Patient Instructions  Medication Instructions:   Your physician recommends that you continue on your current medications as directed. Please refer to the Current Medication list given to you today.  Labwork:  NONE  Testing/Procedures:  NONE  Follow-Up:  Your physician recommends that you schedule a  follow-up appointment in: 3-4 months with Dr. Debara Pickett.  Any Other Special Instructions Will Be Listed Below (If Applicable).  If you need a refill on your cardiac medications before your next appointment, please call your pharmacy.    Hilbert Corrigan, [place]  05/01/2018 11:42 PM    [facility] [place], [place], [place]  [address] Phone: [phone]; Fax: [phone]

## 2018-04-29 NOTE — Patient Instructions (Addendum)
Medication Instructions:   Your physician recommends that you continue on your current medications as directed. Please refer to the Current Medication list given to you today.  Labwork:  NONE  Testing/Procedures:  NONE  Follow-Up:  Your physician recommends that you schedule a follow-up appointment in: 3-4 months with Dr. Debara Pickett.  Any Other Special Instructions Will Be Listed Below (If Applicable).  If you need a refill on your cardiac medications before your next appointment, please call your pharmacy.

## 2018-04-30 ENCOUNTER — Encounter (HOSPITAL_COMMUNITY)
Admission: RE | Admit: 2018-04-30 | Discharge: 2018-04-30 | Disposition: A | Discharge status: Home / Self Care | Payer: BLUE CROSS/BLUE SHIELD | Source: Ambulatory Visit | Attending: Internal Medicine | Admitting: Internal Medicine

## 2018-04-30 ENCOUNTER — Encounter (HOSPITAL_COMMUNITY): Payer: BLUE CROSS/BLUE SHIELD

## 2018-04-30 DIAGNOSIS — F419 Anxiety disorder, unspecified: Secondary | ICD-10-CM | POA: Diagnosis not present

## 2018-04-30 DIAGNOSIS — F329 Major depressive disorder, single episode, unspecified: Secondary | ICD-10-CM | POA: Diagnosis not present

## 2018-04-30 DIAGNOSIS — E785 Hyperlipidemia, unspecified: Secondary | ICD-10-CM | POA: Diagnosis not present

## 2018-04-30 DIAGNOSIS — I214 Non-ST elevation (NSTEMI) myocardial infarction: Secondary | ICD-10-CM | POA: Diagnosis not present

## 2018-04-30 DIAGNOSIS — Z951 Presence of aortocoronary bypass graft: Secondary | ICD-10-CM | POA: Diagnosis not present

## 2018-04-30 DIAGNOSIS — Z794 Long term (current) use of insulin: Secondary | ICD-10-CM | POA: Diagnosis not present

## 2018-04-30 DIAGNOSIS — E119 Type 2 diabetes mellitus without complications: Secondary | ICD-10-CM | POA: Diagnosis not present

## 2018-04-30 DIAGNOSIS — F17210 Nicotine dependence, cigarettes, uncomplicated: Secondary | ICD-10-CM | POA: Diagnosis not present

## 2018-04-30 DIAGNOSIS — I10 Essential (primary) hypertension: Secondary | ICD-10-CM | POA: Diagnosis not present

## 2018-04-30 DIAGNOSIS — Z79899 Other long term (current) drug therapy: Secondary | ICD-10-CM | POA: Diagnosis not present

## 2018-04-30 NOTE — Progress Notes (Signed)
Chrys was given smoking cessation information today. Patient has expressed no desire to quit smoking at this time.Barnet Pall, RN,BSN 04/30/2018 2:09 PM

## 2018-05-01 ENCOUNTER — Encounter: Payer: Self-pay | Admitting: Physician Assistant

## 2018-05-03 ENCOUNTER — Encounter (HOSPITAL_COMMUNITY): Payer: BLUE CROSS/BLUE SHIELD

## 2018-05-04 NOTE — Telephone Encounter (Signed)
Lm to call back Please see below message from Doctor Brigitte Pulse

## 2018-05-05 ENCOUNTER — Encounter (HOSPITAL_COMMUNITY): Payer: BLUE CROSS/BLUE SHIELD

## 2018-05-05 ENCOUNTER — Telehealth (HOSPITAL_COMMUNITY): Payer: Self-pay | Admitting: Family Medicine

## 2018-05-05 MED ORDER — ZOLPIDEM TARTRATE 10 MG PO TABS: 10.0000 mg | ORAL_TABLET | Freq: Every evening | ORAL | 0 refills | Status: DC | PRN

## 2018-05-05 NOTE — Telephone Encounter (Signed)
Pt is due for a f/u OV to recheck her DM within the month. Zolpidem refilled x 30 tabs only. Please have pt sched an OV to f/u DM and insomnia within the next mo.

## 2018-05-07 ENCOUNTER — Encounter (HOSPITAL_COMMUNITY): Payer: BLUE CROSS/BLUE SHIELD

## 2018-05-10 ENCOUNTER — Encounter (HOSPITAL_COMMUNITY): Payer: BLUE CROSS/BLUE SHIELD

## 2018-05-10 ENCOUNTER — Telehealth (HOSPITAL_COMMUNITY): Payer: Self-pay | Admitting: Family Medicine

## 2018-05-10 ENCOUNTER — Other Ambulatory Visit: Payer: Self-pay | Admitting: Physician Assistant

## 2018-05-10 DIAGNOSIS — E119 Type 2 diabetes mellitus without complications: Secondary | ICD-10-CM

## 2018-05-10 DIAGNOSIS — I252 Old myocardial infarction: Secondary | ICD-10-CM

## 2018-05-12 ENCOUNTER — Telehealth (HOSPITAL_COMMUNITY): Payer: Self-pay | Admitting: Family Medicine

## 2018-05-12 ENCOUNTER — Encounter (HOSPITAL_COMMUNITY): Payer: BLUE CROSS/BLUE SHIELD

## 2018-05-14 ENCOUNTER — Encounter (HOSPITAL_COMMUNITY): Payer: BLUE CROSS/BLUE SHIELD

## 2018-05-17 ENCOUNTER — Encounter (HOSPITAL_COMMUNITY): Payer: BLUE CROSS/BLUE SHIELD

## 2018-05-19 ENCOUNTER — Encounter (HOSPITAL_COMMUNITY): Payer: BLUE CROSS/BLUE SHIELD

## 2018-05-21 ENCOUNTER — Encounter (HOSPITAL_COMMUNITY): Payer: BLUE CROSS/BLUE SHIELD

## 2018-05-25 ENCOUNTER — Telehealth (HOSPITAL_COMMUNITY): Payer: Self-pay | Admitting: *Deleted

## 2018-05-26 ENCOUNTER — Encounter (HOSPITAL_COMMUNITY)
Admission: RE | Admit: 2018-05-26 | Discharge: 2018-05-26 | Disposition: A | Discharge status: Home / Self Care | Payer: BLUE CROSS/BLUE SHIELD | Source: Ambulatory Visit | Attending: Internal Medicine | Admitting: Internal Medicine

## 2018-05-26 ENCOUNTER — Encounter (HOSPITAL_COMMUNITY): Payer: BLUE CROSS/BLUE SHIELD

## 2018-05-26 DIAGNOSIS — E119 Type 2 diabetes mellitus without complications: Secondary | ICD-10-CM | POA: Insufficient documentation

## 2018-05-26 DIAGNOSIS — Z951 Presence of aortocoronary bypass graft: Secondary | ICD-10-CM | POA: Diagnosis not present

## 2018-05-26 DIAGNOSIS — F329 Major depressive disorder, single episode, unspecified: Secondary | ICD-10-CM | POA: Insufficient documentation

## 2018-05-26 DIAGNOSIS — F17210 Nicotine dependence, cigarettes, uncomplicated: Secondary | ICD-10-CM | POA: Insufficient documentation

## 2018-05-26 DIAGNOSIS — E785 Hyperlipidemia, unspecified: Secondary | ICD-10-CM | POA: Diagnosis not present

## 2018-05-26 DIAGNOSIS — I214 Non-ST elevation (NSTEMI) myocardial infarction: Secondary | ICD-10-CM | POA: Insufficient documentation

## 2018-05-26 DIAGNOSIS — Z794 Long term (current) use of insulin: Secondary | ICD-10-CM | POA: Diagnosis not present

## 2018-05-26 DIAGNOSIS — Z79899 Other long term (current) drug therapy: Secondary | ICD-10-CM | POA: Insufficient documentation

## 2018-05-26 DIAGNOSIS — F419 Anxiety disorder, unspecified: Secondary | ICD-10-CM | POA: Diagnosis not present

## 2018-05-26 DIAGNOSIS — I10 Essential (primary) hypertension: Secondary | ICD-10-CM | POA: Diagnosis not present

## 2018-05-26 NOTE — Progress Notes (Signed)
Reviewed home exercise guidelines with patient including endpoints, temperature precautions, target heart rate and rate of perceived exertion. Pt plans to walk and start yoga as her mode of home exercise. Pt voices understanding of instructions given. Sol Passer, MS, ACSM CEP

## 2018-05-27 NOTE — Progress Notes (Signed)
Cardiac Individual Treatment Plan  Patient Details  Name: Victoria Eaton MRN: [id number] Date of Birth: 1960/11/02 Referring Provider:     CARDIAC REHAB PHASE II ORIENTATION from 04/22/2018 in [place]  Referring Provider  Lyman Bishop MD      Initial Encounter Date:    CARDIAC REHAB PHASE II ORIENTATION from 04/22/2018 in [place]  Date  04/22/18      Visit Diagnosis: 10/19/17 S/P CABG x 4  12/24/17 NSTEMI (non-ST elevated myocardial infarction) [facility])  Patient's Home Medications on Admission:  Current Outpatient Medications:  .  amLODipine (NORVASC) 10 MG tablet, Take 0.5 tablets (5 mg total) by mouth daily., Disp: 90 tablet, Rfl: 3 .  aspirin 81 MG EC tablet, Take 1 tablet (81 mg total) by mouth daily., Disp: 90 tablet, Rfl: 3 .  atorvastatin (LIPITOR) 80 MG tablet, Take 1 tablet (80 mg total) by mouth daily at 6 PM., Disp: 90 tablet, Rfl: 1 .  buPROPion (WELLBUTRIN XL) 150 MG 24 hr tablet, TAKE ONE TABLET BY MOUTH ONE TIME DAILY, Disp: 90 tablet, Rfl: 0 .  canagliflozin (INVOKANA) 300 MG TABS tablet, Take 1 tablet (300 mg total) by mouth daily before breakfast., Disp: 30 tablet, Rfl: 5 .  clopidogrel (PLAVIX) 75 MG tablet, Take 1 tablet (75 mg total) by mouth daily., Disp: 90 tablet, Rfl: 3 .  Continuous Blood Gluc Sensor (FREESTYLE LIBRE 14 DAY SENSOR) MISC, 2 Devices by Does not apply route every morning., Disp: 2 each, Rfl: 5 .  EPINEPHrine (EPIPEN 2-PAK) 0.3 mg/0.3 mL IJ SOAJ injection, Inject 0.3 mLs (0.3 mg total) into the muscle once as needed. (Patient taking differently: Inject 0.3 mg into the muscle once as needed (for an allergic reaction). ), Disp: 1 Device, Rfl: 1 .  HYDROcodone-acetaminophen (NORCO) 5-325 MG tablet, Take 1 tablet by mouth every 6 (six) hours as needed for moderate pain., Disp: 12 tablet, Rfl: 0 .  insulin aspart (NOVOLOG FLEXPEN) 100 UNIT/ML FlexPen, Inject 10u sq prior to meals and 5u for  cbg >250, Disp: 30 mL, Rfl: 1 .  Insulin Glargine (LANTUS SOLOSTAR) 100 UNIT/ML Solostar Pen, Inject 35 Units into the skin daily at 10 pm., Disp: 30 pen, Rfl: 1 .  Insulin Pen Needle (PEN NEEDLES) 32G X 4 MM MISC, 1 Units by Does not apply route daily., Disp: 400 each, Rfl: 11 .  isosorbide mononitrate (IMDUR) 30 MG 24 hr tablet, Take 3 tablets (90 mg total) by mouth daily., Disp: 90 tablet, Rfl: 11 .  lisinopril (PRINIVIL,ZESTRIL) 5 MG tablet, Take 1 tablet (5 mg total) by mouth at bedtime., Disp: 90 tablet, Rfl: 3 .  loratadine (CLARITIN) 10 MG tablet, Take 1 tablet (10 mg total) by mouth daily. (Patient taking differently: Take 10 mg by mouth daily as needed for allergies. ), Disp: 90 tablet, Rfl: 3 .  metoprolol tartrate (LOPRESSOR) 25 MG tablet, TAKE ONE TABLET BY MOUTH TWICE A DAY, Disp: 180 tablet, Rfl: 1 .  Multiple Vitamin (MULTIVITAMIN WITH MINERALS) TABS tablet, Take 1 tablet by mouth daily., Disp: , Rfl:  .  nitroGLYCERIN (NITROSTAT) 0.4 MG SL tablet, Place 1 tablet (0.4 mg total) under the tongue every 5 (five) minutes as needed for chest pain., Disp: 25 tablet, Rfl: 3 .  Omega-3 Fatty Acids (FISH OIL CONCENTRATE) 300 MG CAPS, Take 1 capsule by mouth daily., Disp: , Rfl:  .  oxyCODONE (OXY IR/ROXICODONE) 5 MG immediate release tablet, Take 1 tablet (5 mg total)  by mouth every 8 (eight) hours as needed for severe pain., Disp: 15 tablet, Rfl: 0 .  predniSONE (DELTASONE) 10 MG tablet, Take 45m x 3 days; 376mx 3 days; 2089m 3 days; 56m72m3 days., Disp: 30 tablet, Rfl: 0 .  ranolazine (RANEXA) 500 MG 12 hr tablet, Take 1 tablet (500 mg total) by mouth 2 (two) times daily., Disp: 90 tablet, Rfl: 3 .  sertraline (ZOLOFT) 50 MG tablet, TAKE ONE TABLET BY MOUTH ONE TIME DAILY, Disp: 30 tablet, Rfl: 3 .  TURMERIC PO, Take 1 tablet by mouth daily., Disp: , Rfl:  .  varenicline (CHANTIX STARTING MONTH PAK) 0.5 MG X 11 & 1 MG X 42 tablet, Take one 0.5 mg tab po qd x 3d, then increase to one 0.5  mg tab bid x 4 d, then increase to one 1 mg tab bid, Disp: 53 tablet, Rfl: 0 .  varenicline (CHANTIX) 0.5 MG tablet, Take 1 tablet (0.5 mg total) by mouth 2 (two) times daily., Disp: 60 tablet, Rfl: 0 .  zolpidem (AMBIEN) 10 MG tablet, Take 1 tablet (10 mg total) by mouth at bedtime as needed for sleep. **NEED OFFICE VISIT FOR ANY ADDITIONAL REFILLS**, Disp: 30 tablet, Rfl: 0  Past Medical History: Past Medical History:  Diagnosis Date  . Anxiety   . Arthritis    "maybe in my left foot" (10/14/2017)  . Bladder prolapse, female, acquired 09/14/2017  . Depression   . High cholesterol   . History of stomach ulcers   . Hypertension   . Myocardial infarction (HCC)[place]/06/2005  . Tobacco abuse   . Type II diabetes mellitus (HCC)     Tobacco Use: Social History   Tobacco Use  Smoking Status Current Every Day Smoker  . Packs/day: 0.12  . Years: 45.00  . Pack years: 5.40  . Types: Cigarettes  . Start date: 05/01/1972  . Last attempt to quit: 09/30/2017  . Years since quitting: 0.6  Smokeless Tobacco Never Used    Labs: Recent Review Flowsheet Data    Labs for ITP Cardiac and Pulmonary Rehab Latest Ref Rng & Units 10/19/2017 10/19/2017 10/19/2017 10/20/2017 12/07/2017   Cholestrol 0 - 200 mg/dL - - - - -   LDLCALC 0 - 99 mg/dL - - - - -   HDL >40 mg/dL - - - - -   Trlycerides <150 mg/dL - - - - -   Hemoglobin A1c - - - - - 8.6   PHART 7.350 - 7.450 7.336(L) 7.308(L) - - -   PCO2ART 32.0 - 48.0 mmHg 43.5 46.1 - - -   HCO3 20.0 - 28.0 mmol/L 23.5 23.3 - - -   TCO2 22 - 32 mmol/L 25 25 23 22  -   ACIDBASEDEF 0.0 - 2.0 mmol/L 2.0 3.0(H) - - -   O2SAT % 99.0 98.0 - - -      Capillary Blood Glucose: Lab Results  Component Value Date   GLUCAP 105 (H) 04/28/2018   GLUCAP 160 (H) 04/28/2018   GLUCAP 226 (H) 02/10/2018   GLUCAP 134 (H) 02/10/2018   GLUCAP 272 (H) 02/09/2018     Exercise Target Goals: Exercise Program Goal: Individual exercise prescription set using results from  initial 6 min walk test and THRR while considering  patient's activity barriers and safety.   Exercise Prescription Goal: Initial exercise prescription builds to 30-45 minutes a day of aerobic activity, 2-3 days per week.  Home exercise guidelines will be given to patient  during program as part of exercise prescription that the participant will acknowledge.  Activity Barriers & Risk Stratification: Activity Barriers & Cardiac Risk Stratification - 04/22/18 1431      Activity Barriers & Cardiac Risk Stratification   Activity Barriers  Other (comment);Muscular Weakness;Deconditioning;Back Problems;Joint Problems;Arthritis    Comments  B hip pain, B shoulder pain     Cardiac Risk Stratification  High       6 Minute Walk: 6 Minute Walk    Row Name 04/22/18 1554 04/22/18 1610       6 Minute Walk   Phase  Initial  -    Distance  1437 feet  -    Walk Time  6 minutes  -    # of Rest Breaks  0  -    MPH  2.7  -    METS  3.6  -    RPE  14  -    VO2 Peak  12.67  -    Symptoms  No  Yes (comment) Patient experienced b/l leg and hip pain resolved with rest    Resting HR  68 bpm  -    Resting BP  108/72  -    Resting Oxygen Saturation   95 %  -    Exercise Oxygen Saturation  during 6 min walk  97 %  -    Max Ex. HR  98 bpm  -    Max Ex. BP  142/64  -    2 Minute Post BP  114/82  -       Oxygen Initial Assessment:   Oxygen Re-Evaluation:   Oxygen Discharge (Final Oxygen Re-Evaluation):   Initial Exercise Prescription: Initial Exercise Prescription - 04/22/18 1500      Date of Initial Exercise RX and Referring Provider   Date  04/22/18    Referring Provider  Lyman Bishop MD      Treadmill   MPH  2.5    Grade  0    Minutes  10    METs  2.91      Bike   Level  0.8    Minutes  10    METs  2.78      NuStep   Level  3    SPM  80    Minutes  10    METs  2      Prescription Details   Frequency (times per week)  3    Duration  Progress to 30 minutes of continuous  aerobic without signs/symptoms of physical distress      Intensity   THRR 40-80% of Max Heartrate  64-129    Ratings of Perceived Exertion  11-13    Perceived Dyspnea  0-4      Progression   Progression  Continue to progress workloads to maintain intensity without signs/symptoms of physical distress.      Resistance Training   Training Prescription  Yes    Weight  2lbs    Reps  10-15       Perform Capillary Blood Glucose checks as needed.  Exercise Prescription Changes: Exercise Prescription Changes    Row Name 04/28/18 0950 05/26/18 0948           Response to Exercise   Blood Pressure (Admit)  114/70  118/64      Blood Pressure (Exercise)  150/80  122/72      Blood Pressure (Exit)  122/88  108/70      Heart Rate (Admit)  70 bpm  59 bpm      Heart Rate (Exercise)  91 bpm  101 bpm      Heart Rate (Exit)  69 bpm  69 bpm      Rating of Perceived Exertion (Exercise)  13  12      Symptoms  none  none      Duration  Progress to 30 minutes of  aerobic without signs/symptoms of physical distress  Progress to 30 minutes of  aerobic without signs/symptoms of physical distress      Intensity  THRR unchanged  THRR unchanged        Progression   Progression  Continue to progress workloads to maintain intensity without signs/symptoms of physical distress.  Continue to progress workloads to maintain intensity without signs/symptoms of physical distress.      Average METs  2.5  2.7        Resistance Training   Training Prescription  No Relaxation day, no weights.  No Relaxation day, no weights.        Interval Training   Interval Training  No  No        Treadmill   MPH  -  2.5      Grade  -  0      Minutes  -  10      METs  -  2.91        Bike   Level  0.8  0.8      Minutes  10  10      METs  2.77  2.85        NuStep   Level  3  3      SPM  80  80      Minutes  10  10      METs  2.3  2.4        Track   Laps  8  -      Minutes  10  -      METs  2.39  -        Home  Exercise Plan   Plans to continue exercise at  -  Home (comment)      Frequency  -  Add 3 additional days to program exercise sessions.      Initial Home Exercises Provided  -  05/26/18         Exercise Comments: Exercise Comments    Row Name 04/28/18 1030 05/26/18 1022         Exercise Comments  Reviewed METs with patient.  Reviewed home exercise guidelines, METs, and goals with patient.         Exercise Goals and Review: Exercise Goals    Row Name 04/22/18 1431             Exercise Goals   Increase Physical Activity  Yes       Intervention  Provide advice, education, support and counseling about physical activity/exercise needs.;Develop an individualized exercise prescription for aerobic and resistive training based on initial evaluation findings, risk stratification, comorbidities and participant's personal goals.       Expected Outcomes  Short Term: Attend rehab on a regular basis to increase amount of physical activity.;Long Term: Exercising regularly at least 3-5 days a week.;Long Term: Add in home exercise to make exercise part of routine and to increase amount of physical activity.       Increase Strength and Stamina  Yes       Intervention  Provide advice, education, support and counseling about physical activity/exercise needs.;Develop an individualized exercise prescription for aerobic and resistive training based on initial evaluation findings, risk stratification, comorbidities and participant's personal goals.       Expected Outcomes  Short Term: Increase workloads from initial exercise prescription for resistance, speed, and METs.;Short Term: Perform resistance training exercises routinely during rehab and add in resistance training at home;Long Term: Improve cardiorespiratory fitness, muscular endurance and strength as measured by increased METs and functional capacity (6MWT)       Able to understand and use rate of perceived exertion (RPE) scale  Yes        Intervention  Provide education and explanation on how to use RPE scale       Expected Outcomes  Short Term: Able to use RPE daily in rehab to express subjective intensity level;Long Term:  Able to use RPE to guide intensity level when exercising independently       Knowledge and understanding of Target Heart Rate Range (THRR)  Yes       Intervention  Provide education and explanation of THRR including how the numbers were predicted and where they are located for reference       Expected Outcomes  Short Term: Able to state/look up THRR;Long Term: Able to use THRR to govern intensity when exercising independently;Short Term: Able to use daily as guideline for intensity in rehab       Able to check pulse independently  Yes       Intervention  Provide education and demonstration on how to check pulse in carotid and radial arteries.;Review the importance of being able to check your own pulse for safety during independent exercise       Expected Outcomes  Short Term: Able to explain why pulse checking is important during independent exercise;Long Term: Able to check pulse independently and accurately       Understanding of Exercise Prescription  Yes       Intervention  Provide education, explanation, and written materials on patient's individual exercise prescription       Expected Outcomes  Short Term: Able to explain program exercise prescription;Long Term: Able to explain home exercise prescription to exercise independently          Exercise Goals Re-Evaluation : Exercise Goals Re-Evaluation    Row Name 04/28/18 1030 05/26/18 0948           Exercise Goal Re-Evaluation   Exercise Goals Review  Able to understand and use rate of perceived exertion (RPE) scale;Increase Physical Activity  Increase Physical Activity;Understanding of Exercise Prescription;Knowledge and understanding of Target Heart Rate Range (THRR)      Comments  Patient tolerated first session of exercise well. Pt able to understand  and use the RPE scale appropriately.  Reviewed home exercise guidelines with patient including THRR, RPE scale, and endpoints for exercise. Pt plans to start doing yoga 3 x's/ week. In the meantime pt encouraged to walk 1-2 days/week in addition to exercise at cardaic rehab.      Expected Outcomes  Increase workloads as tolerated.  Patient will add 2-3 days/ week exercise at home, walking and/or yoga in addition to exercise at cardiac rehab to help achieve personal goals to increase strength and stamina and help with weight loss.         Discharge Exercise Prescription (Final Exercise Prescription Changes): Exercise Prescription Changes - 05/26/18 0948      Response to Exercise   Blood Pressure (Admit)  118/64  Blood Pressure (Exercise)  122/72    Blood Pressure (Exit)  108/70    Heart Rate (Admit)  59 bpm    Heart Rate (Exercise)  101 bpm    Heart Rate (Exit)  69 bpm    Rating of Perceived Exertion (Exercise)  12    Symptoms  none    Duration  Progress to 30 minutes of  aerobic without signs/symptoms of physical distress    Intensity  THRR unchanged      Progression   Progression  Continue to progress workloads to maintain intensity without signs/symptoms of physical distress.    Average METs  2.7      Resistance Training   Training Prescription  No   Relaxation day, no weights.     Interval Training   Interval Training  No      Treadmill   MPH  2.5    Grade  0    Minutes  10    METs  2.91      Bike   Level  0.8    Minutes  10    METs  2.85      NuStep   Level  3    SPM  80    Minutes  10    METs  2.4      Track   Laps  --    Minutes  --    METs  --      Home Exercise Plan   Plans to continue exercise at  Home (comment)    Frequency  Add 3 additional days to program exercise sessions.    Initial Home Exercises Provided  05/26/18       Nutrition:  Target Goals: Understanding of nutrition guidelines, daily intake of sodium <1539m, cholesterol <2022m  calories 30% from fat and 7% or less from saturated fats, daily to have 5 or more servings of fruits and vegetables.  Biometrics:  Post Biometrics - 04/22/18 1543       Post  Biometrics   Height  5' 6"  (1.676 m)    Weight  85.4 kg    Waist Circumference  38.25 inches    Hip Circumference  43.5 inches    Waist to Hip Ratio  0.88 %    BMI (Calculated)  30.4    Triceps Skinfold  41 mm    Grip Strength  28 kg    Flexibility  15 in    Single Leg Stand  17.44 seconds       Nutrition Therapy Plan and Nutrition Goals: Nutrition Therapy & Goals - 04/22/18 1434      Nutrition Therapy   Diet  consistent carbohydrate heart healthy      Personal Nutrition Goals   Nutrition Goal  Pt to identify food quantities necessary to achieve weight loss of 6-24 lbs. at graduation from cardiac rehab.    Personal Goal #2  Pt to identify and limit food sources of saturated fat, trans fat, and sodium    Personal Goal #3  Improved blood glucose control as evidenced by pt's A1c trending from 8.5 toward less than 7.0.      Intervention Plan   Intervention  Prescribe, educate and counsel regarding individualized specific dietary modifications aiming towards targeted core components such as weight, hypertension, lipid management, diabetes, heart failure and other comorbidities.    Expected Outcomes  Short Term Goal: Understand basic principles of dietary content, such as calories, fat, sodium, cholesterol and nutrients.  Nutrition Assessments: Nutrition Assessments - 04/22/18 1435      MEDFICTS Scores   Pre Score  67       Nutrition Goals Re-Evaluation:   Nutrition Goals Re-Evaluation:   Nutrition Goals Discharge (Final Nutrition Goals Re-Evaluation):   Psychosocial: Target Goals: Acknowledge presence or absence of significant depression and/or stress, maximize coping skills, provide positive support system. Participant is able to verbalize types and ability to use techniques and skills  needed for reducing stress and depression.  Initial Review & Psychosocial Screening: Initial Psych Review & Screening - 04/22/18 1437      Initial Review   Current issues with  Current Stress Concerns    Source of Stress Concerns  Chronic Illness;Family;Financial    Comments  Pt reports stressors related to her recent CV events and diabetes.  Her husband has been ill as well.  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 Her husband has applied for disability as well.       Family Dynamics   Good Support System?  Yes   Caycee lists her husband, daughters, sister, and friends as sources of support.      Barriers   Psychosocial barriers to participate in program  Psychosocial barriers identified (see note);The patient should benefit from training in stress management and relaxation.   Barriers as noted above.      Screening Interventions   Interventions  Encouraged to exercise;To provide support and resources with identified psychosocial needs;Provide feedback about the scores to participant   Pt offered to meet with spiritual care department and counseling.  Pt declined at this time.    Expected Outcomes  Short Term goal: Identification and review with participant of any Quality of Life or Depression concerns found by scoring the questionnaire.;Long Term goal: The participant improves quality of Life and PHQ9 Scores as seen by post scores and/or verbalization of changes       Quality of Life Scores: Quality of Life - 04/22/18 1541      Quality of Life   Select  Quality of Life      Quality of Life Scores   Health/Function Pre  9.97 %    Socioeconomic Pre  22.07 %    Psych/Spiritual Pre  11.36 %    Family Pre  15.4 %    GLOBAL Pre  13.54 %      Scores of 19 and below usually indicate a poorer quality of life in these areas.  A difference of  2-3 points is a clinically meaningful difference.  A difference of 2-3 points in the total score of the Quality of Life Index has been  associated with significant improvement in overall quality of life, self-image, physical symptoms, and general health in studies assessing change in quality of life.  PHQ-9: Recent Review Flowsheet Data    Depression screen [facility] 2/9 04/28/2018 03/09/2018 03/02/2018 01/07/2018 12/16/2017   Decreased Interest - 2 3 0 1   Down, Depressed, Hopeless 1 3 3  0 1   PHQ - 2 Score 1 5 6  0 2   Altered sleeping - 3  0 - 0   Tired, decreased energy - 3 2 - 2   Change in appetite - 1 3 - 1   Feeling bad or failure about yourself  - 1 2 - 2   Trouble concentrating - 3 3 - 2   Moving slowly or fidgety/restless - 0 0 - 0   Suicidal thoughts - 0 0 - 0   PHQ-9 Score -  59 16 - 9   Difficult doing work/chores - Very difficult Very difficult - Somewhat difficult     Interpretation of Total Score  Total Score Depression Severity:  1-4 = Minimal depression, 5-9 = Mild depression, 10-14 = Moderate depression, 15-19 = Moderately severe depression, 20-27 = Severe depression   Psychosocial Evaluation and Intervention:   Psychosocial Re-Evaluation: Psychosocial Re-Evaluation    [place] Name 05/27/18 1522             Psychosocial Re-Evaluation   Current issues with  Current Stress Concerns       Comments  Mikenzi returned after being absent for several sessions       Expected Outcomes  Shaton will have decreased stressors upon completion of cardiac rehab       Interventions  Stress management education       Comments  Pt reports stressors related to her recent CV events and diabetes.  Her husband has been ill as well.  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 Her husband has applied for disability as well.          Initial Review   Source of Stress Concerns  Chronic Illness;Financial;Family          Psychosocial Discharge (Final Psychosocial Re-Evaluation): Psychosocial Re-Evaluation - 05/27/18 1522      Psychosocial Re-Evaluation   Current issues with  Current Stress Concerns    Comments  Lakendria  returned after being absent for several sessions    Expected Outcomes  Jareli will have decreased stressors upon completion of cardiac rehab    Interventions  Stress management education    Comments  Pt reports stressors related to her recent CV events and diabetes.  Her husband has been ill as well.  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 Her husband has applied for disability as well.       Initial Review   Source of Stress Concerns  Chronic Illness;Financial;Family       Vocational Rehabilitation: Provide vocational rehab assistance to qualifying candidates.   Vocational Rehab Evaluation & Intervention: Vocational Rehab - 04/22/18 1437      Initial Vocational Rehab Evaluation & Intervention   Assessment shows need for Vocational Rehabilitation  No       Education: Education Goals: Education classes will be provided on a weekly basis, covering required topics. Participant will state understanding/return demonstration of topics presented.  Learning Barriers/Preferences: Learning Barriers/Preferences - 04/22/18 1430      Learning Barriers/Preferences   Learning Barriers  Sight    Learning Preferences  Skilled Demonstration       Education Topics: Count Your Pulse:  -Group instruction provided by verbal instruction, demonstration, patient participation and written materials to support subject.  Instructors address importance of being able to find your pulse and how to count your pulse when at home without a heart monitor.  Patients get hands on experience counting their pulse with staff help and individually.   Heart Attack, Angina, and Risk Factor Modification:  -Group instruction provided by verbal instruction, video, and written materials to support subject.  Instructors address signs and symptoms of angina and heart attacks.    Also discuss risk factors for heart disease and how to make changes to improve heart health risk factors.   Functional Fitness:  -Group  instruction provided by verbal instruction, demonstration, patient participation, and written materials to support subject.  Instructors address safety measures for doing things around the house.  Discuss how to get up and down off  the floor, how to pick things up properly, how to safely get out of a chair without assistance, and balance training.   Meditation and Mindfulness:  -Group instruction provided by verbal instruction, patient participation, and written materials to support subject.  Instructor addresses importance of mindfulness and meditation practice to help reduce stress and improve awareness.  Instructor also leads participants through a meditation exercise.    Stretching for Flexibility and Mobility:  -Group instruction provided by verbal instruction, patient participation, and written materials to support subject.  Instructors lead participants through series of stretches that are designed to increase flexibility thus improving mobility.  These stretches are additional exercise for major muscle groups that are typically performed during regular warm up and cool down.   Hands Only CPR:  -Group verbal, video, and participation provides a basic overview of AHA guidelines for community CPR. Role-play of emergencies allow participants the opportunity to practice calling for help and chest compression technique with discussion of AED use.   Hypertension: -Group verbal and written instruction that provides a basic overview of hypertension including the most recent diagnostic guidelines, risk factor reduction with self-care instructions and medication management.    Nutrition I class: Heart Healthy Eating:  -Group instruction provided by PowerPoint slides, verbal discussion, and written materials to support subject matter. The instructor gives an explanation and review of the Therapeutic Lifestyle Changes diet recommendations, which includes a discussion on lipid goals, dietary fat,  sodium, fiber, plant stanol/sterol esters, sugar, and the components of a well-balanced, healthy diet.   Nutrition II class: Lifestyle Skills:  -Group instruction provided by PowerPoint slides, verbal discussion, and written materials to support subject matter. The instructor gives an explanation and review of label reading, grocery shopping for heart health, heart healthy recipe modifications, and ways to make healthier choices when eating out.   Diabetes Question & Answer:  -Group instruction provided by PowerPoint slides, verbal discussion, and written materials to support subject matter. The instructor gives an explanation and review of diabetes co-morbidities, pre- and post-prandial blood glucose goals, pre-exercise blood glucose goals, signs, symptoms, and treatment of hypoglycemia and hyperglycemia, and foot care basics.   Diabetes Blitz:  -Group instruction provided by PowerPoint slides, verbal discussion, and written materials to support subject matter. The instructor gives an explanation and review of the physiology behind type 1 and type 2 diabetes, diabetes medications and rational behind using different medications, pre- and post-prandial blood glucose recommendations and Hemoglobin A1c goals, diabetes diet, and exercise including blood glucose guidelines for exercising safely.    Portion Distortion:  -Group instruction provided by PowerPoint slides, verbal discussion, written materials, and food models to support subject matter. The instructor gives an explanation of serving size versus portion size, changes in portions sizes over the last 20 years, and what consists of a serving from each food group.   Stress Management:  -Group instruction provided by verbal instruction, video, and written materials to support subject matter.  Instructors review role of stress in heart disease and how to cope with stress positively.     Exercising on Your Own:  -Group instruction provided by  verbal instruction, power point, and written materials to support subject.  Instructors discuss benefits of exercise, components of exercise, frequency and intensity of exercise, and end points for exercise.  Also discuss use of nitroglycerin and activating EMS.  Review options of places to exercise outside of rehab.  Review guidelines for sex with heart disease.   Cardiac Drugs I:  -Group instruction provided  by verbal instruction and written materials to support subject.  Instructor reviews cardiac drug classes: antiplatelets, anticoagulants, beta blockers, and statins.  Instructor discusses reasons, side effects, and lifestyle considerations for each drug class.   Cardiac Drugs II:  -Group instruction provided by verbal instruction and written materials to support subject.  Instructor reviews cardiac drug classes: angiotensin converting enzyme inhibitors (ACE-I), angiotensin II receptor blockers (ARBs), nitrates, and calcium channel blockers.  Instructor discusses reasons, side effects, and lifestyle considerations for each drug class.   Anatomy and Physiology of the Circulatory System:  Group verbal and written instruction and models provide basic cardiac anatomy and physiology, with the coronary electrical and arterial systems. Review of: AMI, Angina, Valve disease, Heart Failure, Peripheral Artery Disease, Cardiac Arrhythmia, Pacemakers, and the ICD.   Other Education:  -Group or individual verbal, written, or video instructions that support the educational goals of the cardiac rehab program.   Holiday Eating Survival Tips:  -Group instruction provided by PowerPoint slides, verbal discussion, and written materials to support subject matter. The instructor gives patients tips, tricks, and techniques to help them not only survive but enjoy the holidays despite the onslaught of food that accompanies the holidays.   Knowledge Questionnaire Score: Knowledge Questionnaire Score - 04/22/18  1437      Knowledge Questionnaire Score   Pre Score  26/28       Core Components/Risk Factors/Patient Goals at Admission: Personal Goals and Risk Factors at Admission - 04/22/18 1619      Core Components/Risk Factors/Patient Goals on Admission    Weight Management  Yes;Obesity;Weight Maintenance;Weight Loss    Intervention  Weight Management: Develop a combined nutrition and exercise program designed to reach desired caloric intake, while maintaining appropriate intake of nutrient and fiber, sodium and fats, and appropriate energy expenditure required for the weight goal.;Weight Management: Provide education and appropriate resources to help participant work on and attain dietary goals.;Weight Management/Obesity: Establish reasonable short term and long term weight goals.;Obesity: Provide education and appropriate resources to help participant work on and attain dietary goals.    Admit Weight  188 lb 4.4 oz (85.4 kg)    Goal Weight: Short Term  180 lb (81.6 kg)    Goal Weight: Long Term  158 lb (71.7 kg)    Expected Outcomes  Short Term: Continue to assess and modify interventions until short term weight is achieved;Long Term: Adherence to nutrition and physical activity/exercise program aimed toward attainment of established weight goal;Weight Maintenance: Understanding of the daily nutrition guidelines, which includes 25-35% calories from fat, 7% or less cal from saturated fats, less than 250m cholesterol, less than 1.5gm of sodium, & 5 or more servings of fruits and vegetables daily;Weight Loss: Understanding of general recommendations for a balanced deficit meal plan, which promotes 1-2 lb weight loss per week and includes a negative energy balance of [phone] kcal/d;Understanding recommendations for meals to include 15-35% energy as protein, 25-35% energy from fat, 35-60% energy from carbohydrates, less than 2041mof dietary cholesterol, 20-35 gm of total fiber daily;Understanding of  distribution of calorie intake throughout the day with the consumption of 4-5 meals/snacks    Diabetes  Yes    Intervention  Provide education about signs/symptoms and action to take for hypo/hyperglycemia.;Provide education about proper nutrition, including hydration, and aerobic/resistive exercise prescription along with prescribed medications to achieve blood glucose in normal ranges: Fasting glucose 65-99 mg/dL    Expected Outcomes  Short Term: Participant verbalizes understanding of the signs/symptoms and immediate care of hyper/hypoglycemia, proper  foot care and importance of medication, aerobic/resistive exercise and nutrition plan for blood glucose control.;Long Term: Attainment of HbA1C < 7%.    Hypertension  Yes    Intervention  Provide education on lifestyle modifcations including regular physical activity/exercise, weight management, moderate sodium restriction and increased consumption of fresh fruit, vegetables, and low fat dairy, alcohol moderation, and smoking cessation.;Monitor prescription use compliance.    Expected Outcomes  Short Term: Continued assessment and intervention until BP is < 140/51m HG in hypertensive participants. < 130/86mHG in hypertensive participants with diabetes, heart failure or chronic kidney disease.;Long Term: Maintenance of blood pressure at goal levels.    Stress  Yes    Intervention  Offer individual and/or small group education and counseling on adjustment to heart disease, stress management and health-related lifestyle change. Teach and support self-help strategies.;Refer participants experiencing significant psychosocial distress to appropriate mental health specialists for further evaluation and treatment. When possible, include family members and significant others in education/counseling sessions.    Expected Outcomes  Short Term: Participant demonstrates changes in health-related behavior, relaxation and other stress management skills, ability to  obtain effective social support, and compliance with psychotropic medications if prescribed.;Long Term: Emotional wellbeing is indicated by absence of clinically significant psychosocial distress or social isolation.       Core Components/Risk Factors/Patient Goals Review:  Goals and Risk Factor Review    Row Name 05/27/18 1525             Core Components/Risk Factors/Patient Goals Review   Personal Goals Review  Weight Management/Obesity;Stress;Hypertension;Lipids       Review  CaSemiaheturned to exercise at cardiac rehab after being absent for several sessions       Expected Outcomes  CaRuthellaill participate in phase 2 cardiac rehab for exercise, nutrition and lifestyle modification opportunities.          Core Components/Risk Factors/Patient Goals at Discharge (Final Review):  Goals and Risk Factor Review - 05/27/18 1525      Core Components/Risk Factors/Patient Goals Review   Personal Goals Review  Weight Management/Obesity;Stress;Hypertension;Lipids    Review  CaMichaeleneeturned to exercise at cardiac rehab after being absent for several sessions    Expected Outcomes  CaElafill participate in phase 2 cardiac rehab for exercise, nutrition and lifestyle modification opportunities.       ITP Comments: ITP Comments    Row Name 04/22/18 1426 05/27/18 1520         ITP Comments  Dr. TrFransico HimMedical Director  30 Day ITP Review. Pt reterned to exercise yesterday after being absent for several sessions         Comments: See ITP comments.MaBarnet PallRN,BSN 05/27/2018 3:30 PM

## 2018-05-28 ENCOUNTER — Encounter (HOSPITAL_COMMUNITY)
Admission: RE | Admit: 2018-05-28 | Discharge: 2018-05-28 | Disposition: A | Discharge status: Home / Self Care | Payer: BLUE CROSS/BLUE SHIELD | Source: Ambulatory Visit | Attending: Internal Medicine | Admitting: Internal Medicine

## 2018-05-28 ENCOUNTER — Encounter (HOSPITAL_COMMUNITY): Payer: BLUE CROSS/BLUE SHIELD

## 2018-05-28 DIAGNOSIS — I214 Non-ST elevation (NSTEMI) myocardial infarction: Secondary | ICD-10-CM

## 2018-05-28 DIAGNOSIS — Z951 Presence of aortocoronary bypass graft: Secondary | ICD-10-CM

## 2018-05-28 NOTE — Progress Notes (Signed)
Incomplete Session Note  Patient Details  Name: Ebony Yorio MRN: [id number] Date of Birth: 05/23/1961 Referring Provider:     CARDIAC REHAB PHASE II ORIENTATION from 04/22/2018 in [facility]  Referring Provider  Lyman Bishop MD      [name] did not complete her rehab session.  [place] reported developing a headache today while on the nustep. Blood pressure 124/70 . CBG 218 with Tayra's freestyle meter. Emmely has no other symptoms and reports the headache is on her temples. I advised Swara that she not complete exercise today. Olamae is going home to rest and plans to return to exercise on Monday.Barnet Pall, RN,BSN 05/28/2018 10:48 AM

## 2018-05-31 ENCOUNTER — Encounter (HOSPITAL_COMMUNITY): Payer: BLUE CROSS/BLUE SHIELD

## 2018-05-31 ENCOUNTER — Encounter (HOSPITAL_COMMUNITY)
Admission: RE | Admit: 2018-05-31 | Discharge: 2018-05-31 | Disposition: A | Discharge status: Home / Self Care | Payer: BLUE CROSS/BLUE SHIELD | Source: Ambulatory Visit | Attending: Internal Medicine | Admitting: Internal Medicine

## 2018-05-31 VITALS — BP 122/82 | HR 83 | Wt 189.8 lb

## 2018-05-31 DIAGNOSIS — E785 Hyperlipidemia, unspecified: Secondary | ICD-10-CM | POA: Diagnosis not present

## 2018-05-31 DIAGNOSIS — Z951 Presence of aortocoronary bypass graft: Secondary | ICD-10-CM | POA: Diagnosis not present

## 2018-05-31 DIAGNOSIS — I214 Non-ST elevation (NSTEMI) myocardial infarction: Secondary | ICD-10-CM | POA: Diagnosis not present

## 2018-05-31 DIAGNOSIS — I10 Essential (primary) hypertension: Secondary | ICD-10-CM | POA: Diagnosis not present

## 2018-05-31 DIAGNOSIS — F329 Major depressive disorder, single episode, unspecified: Secondary | ICD-10-CM | POA: Diagnosis not present

## 2018-05-31 DIAGNOSIS — E119 Type 2 diabetes mellitus without complications: Secondary | ICD-10-CM | POA: Diagnosis not present

## 2018-05-31 DIAGNOSIS — F17210 Nicotine dependence, cigarettes, uncomplicated: Secondary | ICD-10-CM | POA: Diagnosis not present

## 2018-05-31 DIAGNOSIS — Z79899 Other long term (current) drug therapy: Secondary | ICD-10-CM | POA: Diagnosis not present

## 2018-05-31 DIAGNOSIS — F419 Anxiety disorder, unspecified: Secondary | ICD-10-CM | POA: Diagnosis not present

## 2018-05-31 DIAGNOSIS — Z794 Long term (current) use of insulin: Secondary | ICD-10-CM | POA: Diagnosis not present

## 2018-06-02 ENCOUNTER — Telehealth (HOSPITAL_COMMUNITY): Payer: Self-pay | Admitting: *Deleted

## 2018-06-02 ENCOUNTER — Encounter (HOSPITAL_COMMUNITY): Payer: BLUE CROSS/BLUE SHIELD

## 2018-06-02 ENCOUNTER — Telehealth (HOSPITAL_COMMUNITY): Payer: Self-pay | Admitting: Family Medicine

## 2018-06-03 DIAGNOSIS — H5213 Myopia, bilateral: Secondary | ICD-10-CM | POA: Diagnosis not present

## 2018-06-03 DIAGNOSIS — H524 Presbyopia: Secondary | ICD-10-CM | POA: Diagnosis not present

## 2018-06-03 DIAGNOSIS — H52223 Regular astigmatism, bilateral: Secondary | ICD-10-CM | POA: Diagnosis not present

## 2018-06-03 DIAGNOSIS — E119 Type 2 diabetes mellitus without complications: Secondary | ICD-10-CM | POA: Diagnosis not present

## 2018-06-04 ENCOUNTER — Encounter (HOSPITAL_COMMUNITY): Payer: BLUE CROSS/BLUE SHIELD

## 2018-06-07 ENCOUNTER — Encounter (HOSPITAL_COMMUNITY): Payer: BLUE CROSS/BLUE SHIELD

## 2018-06-07 ENCOUNTER — Telehealth (HOSPITAL_COMMUNITY): Payer: Self-pay | Admitting: *Deleted

## 2018-06-09 ENCOUNTER — Encounter (HOSPITAL_COMMUNITY): Payer: BLUE CROSS/BLUE SHIELD

## 2018-06-09 ENCOUNTER — Other Ambulatory Visit: Payer: Self-pay | Admitting: Family Medicine

## 2018-06-09 NOTE — Telephone Encounter (Signed)
Call to patient- per medication list: Lisinopril dosing in chart is 5 mg and Rx'd from [facility]. Call to patient to verify that is correct dosing information. Left message for patient to call back.   Rx refill request:    Lipitor 80 mg      Last filled: 03/03/18  LOV: 03/02/18  (Patient is over due lab)  PCP: [place]: [facility]

## 2018-06-11 ENCOUNTER — Encounter (HOSPITAL_COMMUNITY): Payer: BLUE CROSS/BLUE SHIELD

## 2018-06-11 NOTE — Telephone Encounter (Signed)
pls advise. Thank you.

## 2018-06-14 ENCOUNTER — Encounter (HOSPITAL_COMMUNITY): Payer: BLUE CROSS/BLUE SHIELD

## 2018-06-14 NOTE — Telephone Encounter (Signed)
Needs office visit with fasting labs in 3 mos

## 2018-06-16 ENCOUNTER — Encounter (HOSPITAL_COMMUNITY): Payer: BLUE CROSS/BLUE SHIELD

## 2018-06-18 ENCOUNTER — Encounter (HOSPITAL_COMMUNITY): Payer: BLUE CROSS/BLUE SHIELD

## 2018-06-21 ENCOUNTER — Encounter (HOSPITAL_COMMUNITY): Payer: BLUE CROSS/BLUE SHIELD

## 2018-06-23 ENCOUNTER — Encounter (HOSPITAL_COMMUNITY): Payer: BLUE CROSS/BLUE SHIELD

## 2018-06-25 ENCOUNTER — Encounter (HOSPITAL_COMMUNITY): Payer: BLUE CROSS/BLUE SHIELD

## 2018-06-28 ENCOUNTER — Encounter (HOSPITAL_COMMUNITY): Payer: BLUE CROSS/BLUE SHIELD

## 2018-06-30 ENCOUNTER — Encounter (HOSPITAL_COMMUNITY): Payer: BLUE CROSS/BLUE SHIELD

## 2018-07-02 ENCOUNTER — Encounter (HOSPITAL_COMMUNITY): Payer: BLUE CROSS/BLUE SHIELD

## 2018-07-05 ENCOUNTER — Encounter (HOSPITAL_COMMUNITY): Payer: BLUE CROSS/BLUE SHIELD

## 2018-07-06 NOTE — Addendum Note (Signed)
Encounter addended by: Sol Passer on: 07/06/2018 3:55 PM  Actions taken: Flowsheet data copied forward, Visit Navigator Flowsheet section accepted, Vitals modified

## 2018-07-07 ENCOUNTER — Encounter (HOSPITAL_COMMUNITY): Payer: BLUE CROSS/BLUE SHIELD

## 2018-07-08 ENCOUNTER — Encounter (HOSPITAL_COMMUNITY): Payer: Self-pay | Admitting: *Deleted

## 2018-07-08 ENCOUNTER — Other Ambulatory Visit: Payer: Self-pay | Admitting: Family Medicine

## 2018-07-08 DIAGNOSIS — Z951 Presence of aortocoronary bypass graft: Secondary | ICD-10-CM

## 2018-07-08 DIAGNOSIS — I214 Non-ST elevation (NSTEMI) myocardial infarction: Secondary | ICD-10-CM

## 2018-07-08 NOTE — Progress Notes (Signed)
Discharge Progress Report  Patient Details  Name: Victoria Eaton MRN: [id number] Date of Birth: Jul 12, 1961 Referring Provider:     CARDIAC REHAB PHASE II ORIENTATION from 04/22/2018 in [place]  Referring Provider  Lyman Bishop MD       Number of Visits: 5  Reason for Discharge:  Early Exit:  Lack of attendance  Smoking History:  Social History   Tobacco Use  Smoking Status Current Every Day Smoker  . Packs/day: 0.12  . Years: 45.00  . Pack years: 5.40  . Types: Cigarettes  . Start date: 05/01/1972  . Last attempt to quit: 09/30/2017  . 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 4  ADL UCSD:   Initial Exercise Prescription:   Discharge Exercise Prescription (Final Exercise Prescription Changes): Exercise Prescription Changes - 05/31/18 1005      Response to Exercise   Blood Pressure (Admit)  122/82    Blood Pressure (Exercise)  148/70    Blood Pressure (Exit)  110/78    Heart Rate (Admit)  83 bpm    Heart Rate (Exercise)  113 bpm    Heart Rate (Exit)  78 bpm    Rating of Perceived Exertion (Exercise)  13    Symptoms  none    Duration  Progress to 30 minutes of  aerobic without signs/symptoms of physical distress    Intensity  THRR unchanged      Progression   Progression  Continue to progress workloads to maintain intensity without signs/symptoms of physical distress.    Average METs  2.9      Resistance Training   Training Prescription  Yes    Weight  3lbs    Reps  10-15    Time  10 Minutes      Interval Training   Interval Training  No      Treadmill   MPH  2.5    Grade  0    Minutes  10    METs  2.91      Bike   Level  --    Minutes  --    METs  --      NuStep   Level  4    SPM  80    Minutes  10    METs  2.9      Home Exercise Plan   Plans to continue exercise at  Home (comment)    Frequency  Add 3 additional days to program exercise sessions.    Initial Home Exercises Provided  05/26/18       Functional Capacity:   Psychological, QOL, Others - Outcomes: PHQ 2/9: Depression screen [facility] 2/9 04/28/2018 03/09/2018 03/02/2018 01/07/2018 12/16/2017  Decreased Interest - 2 3 0 1  Down, Depressed, Hopeless 1 3 3  0 1  PHQ - 2 Score 1 5 6  0 2  Altered sleeping - 3 0 - 0  Tired, decreased energy - 3 2 - 2  Change in appetite - 1 3 - 1  Feeling bad or failure about yourself  - 1 2 - 2  Trouble concentrating - 3 3 - 2  Moving slowly or fidgety/restless - 0 0 - 0  Suicidal thoughts - 0 0 - 0  PHQ-9 Score - 16 16 - 9  Difficult doing work/chores - Very difficult Very difficult - Somewhat difficult    Quality of Life:  Personal Goals: Goals established at orientation with interventions provided to work toward goal.    Personal Goals Discharge: Goals and Risk Factor Review    Row Name 05/27/18 1525             Core Components/Risk Factors/Patient Goals Review   Personal Goals Review  Weight Management/Obesity;Stress;Hypertension;Lipids       Review  Halayna returned to exercise at cardiac rehab after being absent for several sessions       Expected Outcomes  Harue will participate in phase 2 cardiac rehab for exercise, nutrition and lifestyle modification opportunities.          Exercise Goals and Review:   Nutrition & Weight - Outcomes:  Post Biometrics - 05/31/18 1005       Post  Biometrics   Weight  189 lb 13.1 oz (86.1 kg)       Nutrition:   Nutrition Discharge: Nutrition Assessments - 07/08/18 1005      MEDFICTS Scores   Pre Score  67    Post Score  --   pt did not return medficts survey      Education Questionnaire Score:   Madge attended 5 exercise sessions between 04/22/18-05/31/18. Wilmetta's attendance was poor due to transportation and person matters. Barnet Pall, RN,BSN 07/08/2018 1:48 PM

## 2018-07-08 NOTE — Addendum Note (Signed)
Encounter addended by: Ivonne Andrew, RD on: 07/08/2018 10:05 AM  Actions taken: Flowsheet data copied forward, Visit Navigator Flowsheet section accepted

## 2018-07-09 ENCOUNTER — Encounter (HOSPITAL_COMMUNITY): Payer: BLUE CROSS/BLUE SHIELD

## 2018-07-09 ENCOUNTER — Other Ambulatory Visit: Payer: Self-pay | Admitting: Family Medicine

## 2018-07-09 DIAGNOSIS — F329 Major depressive disorder, single episode, unspecified: Secondary | ICD-10-CM

## 2018-07-09 DIAGNOSIS — F419 Anxiety disorder, unspecified: Principal | ICD-10-CM

## 2018-07-09 DIAGNOSIS — F32A Depression, unspecified: Secondary | ICD-10-CM

## 2018-07-12 ENCOUNTER — Encounter (HOSPITAL_COMMUNITY): Payer: BLUE CROSS/BLUE SHIELD

## 2018-07-14 ENCOUNTER — Encounter (HOSPITAL_COMMUNITY): Payer: BLUE CROSS/BLUE SHIELD

## 2018-07-16 ENCOUNTER — Encounter (HOSPITAL_COMMUNITY): Payer: BLUE CROSS/BLUE SHIELD

## 2018-07-19 ENCOUNTER — Encounter (HOSPITAL_COMMUNITY): Payer: BLUE CROSS/BLUE SHIELD

## 2018-07-20 DIAGNOSIS — Z6831 Body mass index (BMI) 31.0-31.9, adult: Secondary | ICD-10-CM | POA: Diagnosis not present

## 2018-07-20 DIAGNOSIS — Z01419 Encounter for gynecological examination (general) (routine) without abnormal findings: Secondary | ICD-10-CM | POA: Diagnosis not present

## 2018-07-21 ENCOUNTER — Encounter (HOSPITAL_COMMUNITY): Payer: BLUE CROSS/BLUE SHIELD

## 2018-07-23 ENCOUNTER — Encounter (HOSPITAL_COMMUNITY): Payer: BLUE CROSS/BLUE SHIELD

## 2018-07-26 ENCOUNTER — Encounter (HOSPITAL_COMMUNITY): Payer: BLUE CROSS/BLUE SHIELD

## 2018-07-28 ENCOUNTER — Encounter (HOSPITAL_COMMUNITY): Payer: BLUE CROSS/BLUE SHIELD

## 2018-07-30 ENCOUNTER — Encounter (HOSPITAL_COMMUNITY): Payer: BLUE CROSS/BLUE SHIELD

## 2018-08-02 DIAGNOSIS — N8182 Incompetence or weakening of pubocervical tissue: Secondary | ICD-10-CM | POA: Diagnosis not present

## 2018-08-03 ENCOUNTER — Ambulatory Visit (INDEPENDENT_AMBULATORY_CARE_PROVIDER_SITE_OTHER): Payer: BLUE CROSS/BLUE SHIELD | Admitting: Physician Assistant

## 2018-08-03 ENCOUNTER — Encounter: Payer: Self-pay | Admitting: Physician Assistant

## 2018-08-03 ENCOUNTER — Other Ambulatory Visit: Payer: Self-pay

## 2018-08-03 ENCOUNTER — Ambulatory Visit (INDEPENDENT_AMBULATORY_CARE_PROVIDER_SITE_OTHER): Payer: BLUE CROSS/BLUE SHIELD | Admitting: Allergy & Immunology

## 2018-08-03 ENCOUNTER — Encounter: Payer: Self-pay | Admitting: Allergy & Immunology

## 2018-08-03 ENCOUNTER — Telehealth: Payer: Self-pay

## 2018-08-03 VITALS — BP 92/59 | HR 86 | Temp 98.5°F | Resp 18 | Ht 66.5 in | Wt 197.6 lb

## 2018-08-03 VITALS — BP 142/84 | HR 70 | Temp 98.2°F | Resp 16 | Ht 66.5 in | Wt 197.2 lb

## 2018-08-03 DIAGNOSIS — Z951 Presence of aortocoronary bypass graft: Secondary | ICD-10-CM

## 2018-08-03 DIAGNOSIS — F419 Anxiety disorder, unspecified: Secondary | ICD-10-CM

## 2018-08-03 DIAGNOSIS — F32A Depression, unspecified: Secondary | ICD-10-CM

## 2018-08-03 DIAGNOSIS — Z794 Long term (current) use of insulin: Secondary | ICD-10-CM

## 2018-08-03 DIAGNOSIS — J3089 Other allergic rhinitis: Secondary | ICD-10-CM | POA: Diagnosis not present

## 2018-08-03 DIAGNOSIS — F329 Major depressive disorder, single episode, unspecified: Secondary | ICD-10-CM | POA: Diagnosis not present

## 2018-08-03 DIAGNOSIS — E118 Type 2 diabetes mellitus with unspecified complications: Secondary | ICD-10-CM | POA: Diagnosis not present

## 2018-08-03 DIAGNOSIS — J302 Other seasonal allergic rhinitis: Secondary | ICD-10-CM | POA: Diagnosis not present

## 2018-08-03 DIAGNOSIS — I10 Essential (primary) hypertension: Secondary | ICD-10-CM | POA: Diagnosis not present

## 2018-08-03 DIAGNOSIS — T7800XD Anaphylactic reaction due to unspecified food, subsequent encounter: Secondary | ICD-10-CM

## 2018-08-03 MED ORDER — FLUTICASONE PROPIONATE 50 MCG/ACT NA SUSP: 1.0000 | Freq: Every day | NASAL | 3 refills | Status: DC | PRN

## 2018-08-03 MED ORDER — BUPROPION HCL ER (XL) 150 MG PO TB24: 150.0000 mg | ORAL_TABLET | Freq: Every day | ORAL | 0 refills | Status: DC

## 2018-08-03 NOTE — Telephone Encounter (Signed)
Prescription has been mailed.

## 2018-08-03 NOTE — Patient Instructions (Signed)
1. Seasonal and perennial allergic rhinitis - Testing today showed: rabbits, ragweed, weeds, grasses and dust mites - Avoidance measures provided. - Continue with: Claritin (loratadine) 6m tablet once daily since this seems to be working well. - Start taking: Flonase (fluticasone) one spray per nostril daily as needed on the worse days.  - You can use an extra dose of the antihistamine, if needed, for breakthrough symptoms.  - Consider nasal saline rinses 1-2 times daily to remove allergens from the nasal cavities as well as help with mucous clearance (this is especially helpful to do before the nasal sprays are given) - Consider allergy shots as a means of long-term control. - Allergy shots "re-train" and "reset" the immune system to ignore environmental allergens and decrease the resulting immune response to those allergens (sneezing, itchy watery eyes, runny nose, nasal congestion, etc).    - Allergy shots improve symptoms in 75-85% of patients.  - We can discuss more at the next appointment if the medications are not working for you.  2. Anaphylactic shock due to food (shellfish, kiwi) - Testing was negative to all of the shellfish, but I would like to get blood testing to confirm this. - We will also get kiwi allergy testing since we are getting blood anyway.  - We will call you in 1-2 weeks with the results. - In the meantime, make an appointment for a shrimp challenge in the office (in anticipation of a negative blood test).   3. Return in about 6 weeks (around 09/14/2018) [place]   Please inform [place] any Emergency Department visits, hospitalizations, or changes in symptoms. Call [place]efore going to the ED for breathing or allergy symptoms since we might be able to fit you in for a sick visit. Feel free to contact u[place]anytime with any questions, problems, or concerns.  It was a pleasure to meet you today! Go home and enjoy the fluffle of [place] and [place]!    Websites that have reliable patient information: 1. American Academy of Asthma, Allergy, and Immunology: www.aaaai.org 2. Food Allergy Research and Education (FARE): foodallergy.org 3. Mothers of Asthmatics: http://www.asthmacommunitynetwork.org 4. American College of Allergy, Asthma, and Immunology: wMonthlyElectricBill.co.uk  Make sure you are registered to vote! If you have moved or changed any of your contact information, you will need to get this updated before voting!    Reducing Pollen Exposure  The American Academy of Allergy, Asthma and Immunology suggests the following steps to reduce your exposure to pollen during allergy seasons.    1. Do not hang sheets or clothing out to dry; pollen may collect on these items. 2. Do not mow lawns or spend time around freshly cut grass; mowing stirs up pollen. 3. Keep windows closed at night.  Keep car windows closed while driving. 4. Minimize morning activities outdoors, a time when pollen counts are usually at their highest. 5. Stay indoors as much as possible when pollen counts or humidity is high and on windy days when pollen tends to remain in the air longer. 6. Use air conditioning when possible.  Many air conditioners have filters that trap the pollen spores. 7. Use a HEPA room air filter to remove pollen form the indoor air you breathe.   Control of House Dust Mite Allergen    House dust mites play a major role in allergic asthma and rhinitis.  They occur in environments with high humidity wherever human skin, the food for dust mites is found. High levels have been  detected in dust obtained from mattresses, pillows, carpets, upholstered furniture, bed covers, clothes and soft toys.  The principal allergen of the house dust mite is found in its feces.  A gram of dust may contain 1,000 mites and 250,000 fecal particles.  Mite antigen is easily measured in the air during house cleaning activities.    1. Encase mattresses, including the box  spring, and pillow, in an air tight cover.  Seal the zipper end of the encased mattresses with wide adhesive tape. 2. Wash the bedding in water of 130 degrees Farenheit weekly.  Avoid cotton comforters/quilts and flannel bedding: the most ideal bed covering is the dacron comforter. 3. Remove all upholstered furniture from the bedroom. 4. Remove carpets, carpet padding, rugs, and non-washable window drapes from the bedroom.  Wash drapes weekly or use plastic window coverings. 5. Remove all non-washable stuffed toys from the bedroom.  Wash stuffed toys weekly. 6. Have the room cleaned frequently with a vacuum cleaner and a damp dust-mop.  The patient should not be in a room which is being cleaned and should wait 1 hour after cleaning before going into the room. 7. Close and seal all heating outlets in the bedroom.  Otherwise, the room will become filled with dust-laden air.  An electric heater can be used to heat the room. 8. Reduce indoor humidity to less than 50%.  Do not use a humidifier.

## 2018-08-03 NOTE — Patient Instructions (Addendum)
Come back and see Dr. Brigitte Pulse for annual exam next month.  Keeping You Healthy   Get These Tests  Blood Pressure- Have your blood pressure checked by your healthcare provider at least once a year.  Normal blood pressure is 120/80.  Weight- Have your body mass index (BMI) calculated to screen for obesity.  BMI is a measure of body fat based on height and weight.  You can calculate your own BMI at [employer]  Cholesterol- Have your cholesterol checked every year.  Diabetes- Have your blood sugar checked every year if you have high blood pressure, high cholesterol, a family history of diabetes or if you are overweight.  Pap Test - Have a pap test every 1 to 5 years if you have been sexually active.  If you are older than 65 and recent pap tests have been normal you may not need additional pap tests.  In addition, if you have had a hysterectomy  for benign disease additional pap tests are not necessary.  Mammogram-Yearly mammograms are essential for early detection of breast cancer  Screening for Colon Cancer- Colonoscopy starting at age 90. Screening may begin sooner depending on your family history and other health conditions.  Follow up colonoscopy as directed by your Gastroenterologist.  Screening for Osteoporosis- Screening begins at age 9 with bone density scanning, sooner if you are at higher risk for developing Osteoporosis.   Get these medicines  Calcium with Vitamin D- Your body requires 1200-1500 mg of Calcium a day and [phone] IU of Vitamin D a day.  You can only absorb 500 mg of Calcium at a time therefore Calcium must be taken in 2 or 3 separate doses throughout the day.  Hormones- Hormone therapy has been associated with increased risk for certain cancers and heart disease.  Talk to your healthcare provider about if you need relief from menopausal symptoms.  Aspirin- Ask your healthcare provider about taking Aspirin to prevent Heart Disease and Stroke.   Get  these Immuniztions  Flu shot- Every fall  Pneumonia shot- Once after the age of 58; if you are younger ask your healthcare provider if you need a pneumonia shot.  Tetanus- Every ten years.  Zostavax- Once after the age of 69 to prevent shingles.   Take these steps  Don't smoke- Your healthcare provider can help you quit. For tips on how to quit, ask your healthcare provider or go to www.smokefree.gov or call 1-800 QUIT-NOW.  Be physically active- Exercise 5 days a week for a minimum of 30 minutes.  If you are not already physically active, start slow and gradually work up to 30 minutes of moderate physical activity.  Try walking, dancing, bike riding, swimming, etc.  Eat a healthy diet- Eat a variety of healthy foods such as fruits, vegetables, whole grains, low fat milk, low fat cheeses, yogurt, lean meats, chicken, fish, eggs, dried beans, tofu, etc.  For more information go to www.thenutritionsource.org  Dental visit- Brush and floss teeth twice daily; visit your dentist twice a year.  Eye exam- Visit your Optometrist or Ophthalmologist yearly.  Drink alcohol in moderation- Limit alcohol intake to one drink or less a day.  Never drink and drive.  Depression- Your emotional health is as important as your physical health.  If you're feeling down or losing interest in things you normally enjoy, please talk to your healthcare provider.  Seat Belts- can save your life; always wear one  Smoke/Carbon Monoxide detectors- These detectors need to be installed on  the appropriate level of your home.  Replace batteries at least once a year.  Violence- If anyone is threatening or hurting you, please tell your healthcare provider.  Living Will/ Health care power of attorney- Discuss with your healthcare provider and family.  Thank you for coming in today. I hope you feel we met your needs.  Feel free to call PCP if you have any questions or further requests.  Please consider signing up for  MyChart if you do not already have it, as this is a great way to communicate with me.  Best,  Whitney McVey, PA-C  IF you received an x-ray today, you will receive an invoice from [facility]. Please contact [facility] at [phone] with questions or concerns regarding your invoice.   IF you received labwork today, you will receive an invoice from [facility]. Please contact [facility] at [phone] with questions or concerns regarding your invoice.   Our billing staff will not be able to assist you with questions regarding bills from these companies.  You will be contacted with the lab results as soon as they are available. The fastest way to get your results is to activate your My Chart account. Instructions are located on the last page of this paperwork. If you have not heard from [place] regarding the results in 2 weeks, please contact this office.

## 2018-08-03 NOTE — Progress Notes (Signed)
Victoria Eaton  MRN: [id number] DOB: [birth year]  PCP: Shawnee Knapp, MD  Subjective:  Pt is a 57 year old female who presents to clinic for medication refill of all medications. She is unsure which medications she needs refills of. She was told by her pharmacist she needs OV for fasting labs for more refills. She is not fasting at this time - Last intake was about 2 hours ago.  PCP is Dr. Brigitte Pulse. Last OV with Dr. Brigitte Pulse 6/11.   DM - Farxiga, levemir 35u qhs and mealtime rapid insulin. She is not sure which medications are her DM mediations "I just take them like they say on the bottles."   Depression - Zoloft and wellbutrin XL. She has been out of Wellbutrin - is unsure how long she has been out.    HTN - blood pressure today is 92/59.   Insomnia -  zolpidem 64m  Pt  has a past medical history of Anxiety, Arthritis, Bladder prolapse, female, acquired (09/14/2017), Depression, High cholesterol, History of stomach ulcers, Hypertension, Myocardial infarction ([facility] (12/30/2004), Tobacco abuse, and Type II diabetes mellitus ([facility].  Review of Systems  Eyes: Negative for visual disturbance.  Respiratory: Negative for cough, chest tightness and shortness of breath.   Cardiovascular: Negative for chest pain and palpitations.  Neurological: Negative for dizziness and headaches.    Patient Active Problem List   Diagnosis Date Noted  . Non-ST elevation (NSTEMI) myocardial infarction ([facility] 02/17/2018  . Type 2 diabetes mellitus with complication, with long-term current use of insulin ([facility] 02/17/2018  . Chest pain 01/01/2018  . Unstable angina ([facility] 01/01/2018  . Arm contusion, left, initial encounter 11/25/2017  . Contusion of right knee 11/25/2017  . Hx of CABG 10/19/2017  . Epigastric pain   . Pancreatitis 10/10/2017  . Hyperlipidemia LDL goal <70 09/14/2017  . Essential hypertension 05/01/2017  . Anxiety and depression 05/01/2017  . Tobacco abuse 05/01/2017  . History of MI (myocardial  infarction) 05/01/2017  . Allergic rhinitis 05/01/2017  . History of arthritis 05/01/2017  . Bladder prolapse, female, acquired 11/09/2013    Current Outpatient Medications on File Prior to Visit  Medication Sig Dispense Refill  . amLODipine (NORVASC) 5 MG tablet   2  . aspirin 81 MG EC tablet Take 1 tablet (81 mg total) by mouth daily. 90 tablet 3  . atorvastatin (LIPITOR) 80 MG tablet Take 1 tablet (80 mg total) by mouth daily at 6 PM. **NEED OFFICE VISIT WITH FASTING LABS FOR REFILLS** 90 tablet 1  . buPROPion (WELLBUTRIN XL) 150 MG 24 hr tablet TAKE ONE TABLET BY MOUTH ONE TIME DAILY 90 tablet 0  . canagliflozin (INVOKANA) 300 MG TABS tablet Take 1 tablet (300 mg total) by mouth daily before breakfast. 30 tablet 5  . CHANTIX 0.5 MG tablet TAKE ONE TABLET BY MOUTH TWICE A DAY 60 tablet 0  . clopidogrel (PLAVIX) 75 MG tablet Take 1 tablet (75 mg total) by mouth daily. 90 tablet 3  . Continuous Blood Gluc Sensor (FREESTYLE LIBRE 14 DAY SENSOR) MISC 2 Devices by Does not apply route every morning. 2 each 5  . EPINEPHrine (EPIPEN 2-PAK) 0.3 mg/0.3 mL IJ SOAJ injection Inject 0.3 mLs (0.3 mg total) into the muscle once as needed. 1 Device 1  . fluticasone (FLONASE) 50 MCG/ACT nasal spray Place 1 spray into both nostrils daily as needed for allergies or rhinitis. 16 g 3  . insulin aspart (NOVOLOG FLEXPEN) 100 UNIT/ML FlexPen Inject 10u sq prior to meals  and 5u for cbg >250 30 mL 1  . Insulin Glargine (LANTUS SOLOSTAR) 100 UNIT/ML Solostar Pen Inject 35 Units into the skin daily at 10 pm. 30 pen 1  . Insulin Pen Needle (PEN NEEDLES) 32G X 4 MM MISC 1 Units by Does not apply route daily. 400 each 11  . isosorbide mononitrate (IMDUR) 30 MG 24 hr tablet Take 3 tablets (90 mg total) by mouth daily. 90 tablet 11  . lisinopril (PRINIVIL,ZESTRIL) 5 MG tablet Take 1 tablet (5 mg total) by mouth at bedtime. 90 tablet 3  . loratadine (CLARITIN) 10 MG tablet Take 1 tablet (10 mg total) by mouth daily. 90  tablet 3  . metoprolol tartrate (LOPRESSOR) 25 MG tablet TAKE ONE TABLET BY MOUTH TWICE A DAY 180 tablet 1  . Multiple Vitamin (MULTIVITAMIN WITH MINERALS) TABS tablet Take 1 tablet by mouth daily.    . Omega-3 Fatty Acids (FISH OIL CONCENTRATE) 300 MG CAPS Take 1 capsule by mouth daily.    . ranolazine (RANEXA) 500 MG 12 hr tablet Take 1 tablet (500 mg total) by mouth 2 (two) times daily. 90 tablet 3  . sertraline (ZOLOFT) 50 MG tablet TAKE ONE TABLET BY MOUTH ONE TIME DAILY 30 tablet 3  . TURMERIC PO Take 1 tablet by mouth daily.    . varenicline (CHANTIX STARTING MONTH PAK) 0.5 MG X 11 & 1 MG X 42 tablet Take one 0.5 mg tab po qd x 3d, then increase to one 0.5 mg tab bid x 4 d, then increase to one 1 mg tab bid 53 tablet 0  . zolpidem (AMBIEN) 10 MG tablet Take 1 tablet (10 mg total) by mouth at bedtime as needed for sleep. **NEED OFFICE VISIT FOR ANY ADDITIONAL REFILLS** 30 tablet 0  . nitroGLYCERIN (NITROSTAT) 0.4 MG SL tablet Place 1 tablet (0.4 mg total) under the tongue every 5 (five) minutes as needed for chest pain. 25 tablet 3   No current facility-administered medications on file prior to visit.     Allergies  Allergen Reactions  . Januvia [Sitagliptin] Nausea And Vomiting and Other (See Comments)    Pancreatitis (this was in Epic, but patient was unaware of this)   . Kiwi Extract Hives  . Shrimp [Shellfish Allergy] Hives  . Metformin And Related Diarrhea  . Penicillins Other (See Comments)    From childhood: Has patient had a PCN reaction causing immediate rash, facial/tongue/throat swelling, SOB or lightheadedness with hypotension: Unk Has patient had a PCN reaction causing severe rash involving mucus membranes or skin necrosis: Unk Has patient had a PCN reaction that required hospitalization: Unk Has patient had a PCN reaction occurring within the last 10 years: No If all of the above answers are "NO", then may proceed with Cephalosporin use.      Objective:  BP (!)  92/59   Pulse 86   Temp 98.5 F (36.9 C) (Oral)   Resp 18   Ht 5' 6.5" (1.689 m)   Wt 197 lb 9.6 oz (89.6 kg)   SpO2 94%   BMI 31.42 kg/m   Physical Exam  Constitutional: She appears well-developed and well-nourished.  Cardiovascular: Normal rate and regular rhythm.  Psychiatric: She has a normal mood and affect. Thought content normal.  Vitals reviewed.    Office Visit from 08/03/2018 in [facility] at [facility]  PHQ-9 Total Score  16      Assessment and Plan :  1. Essential hypertension - Pt is here bc her pharmacist told  her she needs fasting labs for refills. She is unsure of which medications she needs refills of and she is not fasting today. In reviewing her medications, it appears she has plenty of refills. Plan to RTC for annual exam with Dr. Brigitte Pulse in the next 3 months.  - Basic Metabolic Panel - Lipid panel - Urinalysis, dipstick only  2. Type 2 diabetes mellitus with complication, with long-term current use of insulin (HCC) - Hemoglobin A1c  3. Anxiety and depression - buPROPion (WELLBUTRIN XL) 150 MG 24 hr tablet; Take 1 tablet (150 mg total) by mouth daily.  Dispense: 90 tablet; Refill: 0  4. Hx of CABG - attending cardiac rehab.    Mercer Pod, PA-C  [facility] 08/03/2018 2:28 PM  Please note: Portions of this report may have been transcribed using dragon voice recognition software. Every effort was made to ensure accuracy; however, inadvertent computerized transcription errors may be present.

## 2018-08-03 NOTE — Progress Notes (Signed)
NEW PATIENT  Date of Service/Encounter:  08/03/18  Referring provider: Shawnee Knapp, MD   Assessment:   Seasonal and perennial allergic rhinitis (grasses, ragweed, weeds, dust mite, rabbit)  Anaphylactic shock due to food (shellfish) - with negative skin testing today as well as a history of reactions to kiwi  Plan/Recommendations:   1. Seasonal and perennial allergic rhinitis - Testing today showed: rabbits, ragweed, weeds, grasses and dust mites - Avoidance measures provided. - Continue with: Claritin (loratadine) 64m tablet once daily since this seems to be working well. - Start taking: Flonase (fluticasone) one spray per nostril daily as needed on the worse days.  - You can use an extra dose of the antihistamine, if needed, for breakthrough symptoms.  - Consider nasal saline rinses 1-2 times daily to remove allergens from the nasal cavities as well as help with mucous clearance (this is especially helpful to do before the nasal sprays are given) - Consider allergy shots as a means of long-term control. - Allergy shots "re-train" and "reset" the immune system to ignore environmental allergens and decrease the resulting immune response to those allergens (sneezing, itchy watery eyes, runny nose, nasal congestion, etc).    - Allergy shots improve symptoms in 75-85% of patients.  - We can discuss more at the next appointment if the medications are not working for you.  2. Anaphylactic shock due to food (shellfish, kiwi) - Testing was negative to all of the shellfish, but I would like to get blood testing to confirm this. - We will also get kiwi allergy testing since we are getting blood anyway.  - We will call you in 1-2 weeks with the results. - In the meantime, make an appointment for a shrimp challenge in the office (in anticipation of a negative blood test).   3. Return in about 6 weeks (around 09/14/2018) for [name]    Subjective:   Victoria Eaton a 57 y.o. female presenting today for evaluation of  Chief Complaint  Patient presents with  . Food Intolerance    Victoria Eaton a history of the following: Patient Active Problem List   Diagnosis Date Noted  . Non-ST elevation (NSTEMI) myocardial infarction ([place] 02/17/2018  . Type 2 diabetes mellitus with complication, with long-term current use of insulin ([place] 02/17/2018  . Chest pain 01/01/2018  . Unstable angina ([place] 01/01/2018  . Arm contusion, left, initial encounter 11/25/2017  . Contusion of right knee 11/25/2017  . Hx of CABG 10/19/2017  . Epigastric pain   . Pancreatitis 10/10/2017  . Hyperlipidemia LDL goal <70 09/14/2017  . Essential hypertension 05/01/2017  . Anxiety and depression 05/01/2017  . Tobacco abuse 05/01/2017  . History of MI (myocardial infarction) 05/01/2017  . Allergic rhinitis 05/01/2017  . History of arthritis 05/01/2017  . Bladder prolapse, female, acquired 11/09/2013    History obtained from: chart review and patient.  Victoria Eaton referred by SShawnee Knapp MD.     Victoria Eaton a 57y.o. female presenting for an evaluation of food allergies as well as allergic rhinitis.   Allergic Rhinitis Symptom History: She does have congestion and coughing when she lays down to sleep at night. She denies symptoms of reflux. She uses vapor rub with improvement in the symptoms. She does not get antibiotics often for sinus infections at all. She does have two free roaming rabbits. She did take Claritin on a daily basis at one point due to the emergence of a rash with  exposure to the rabbits. She has had rabbits for years. She has two currently named Victoria Eaton. They are trained and have a litter box. She rarely if ever needs antibiotics. Her rabbits eat a combination of rabbit pellets as well as carrots   Food Allergy Symptom History: She reports that she was eating steamed shrimp three years ago. It started with hand itching when she ate raw shrimp.  She was still able to eat it initially. This went on for a few years before she reacted to it systemically. She describes itching over her entire body. Then she also had a reaction the kiwi despite years of reacting to the raw fruit. She did not have swelling with the kiwi, but she did have trouble with swelling with the shrimp. She did not have breathing difficulties and took Benadryl with improvement in her symptoms. Since then, she has avoided all shellfish. But she does eat fin fish as well as calamari without problems. She is careful about cross contamination. But she would like to eat shrimp once again. She otherwise tolerates all of the major food allergens without adverse event.   Otherwise, there is no history of other atopic diseases, including stinging insect allergies or eczema. There is no significant infectious history. Vaccinations are up to date.    Past Medical History: Patient Active Problem List   Diagnosis Date Noted  . Non-ST elevation (NSTEMI) myocardial infarction ([place]) 02/17/2018  . Type 2 diabetes mellitus with complication, with long-term current use of insulin ([place]) 02/17/2018  . Chest pain 01/01/2018  . Unstable angina ([place]) 01/01/2018  . Arm contusion, left, initial encounter 11/25/2017  . Contusion of right knee 11/25/2017  . Hx of CABG 10/19/2017  . Epigastric pain   . Pancreatitis 10/10/2017  . Hyperlipidemia LDL goal <70 09/14/2017  . Essential hypertension 05/01/2017  . Anxiety and depression 05/01/2017  . Tobacco abuse 05/01/2017  . History of MI (myocardial infarction) 05/01/2017  . Allergic rhinitis 05/01/2017  . History of arthritis 05/01/2017  . Bladder prolapse, female, acquired 11/09/2013    Medication List:  Allergies as of 08/03/2018      Reactions   Januvia [sitagliptin] Nausea And Vomiting, Other (See Comments)   Pancreatitis (this was in Epic, but patient was unaware of this)   Kiwi Extract Hives   Shrimp [shellfish Allergy] Hives    Metformin And Related Diarrhea   Penicillins Other (See Comments)   From childhood: Has patient had a PCN reaction causing immediate rash, facial/tongue/throat swelling, SOB or lightheadedness with hypotension: Unk Has patient had a PCN reaction causing severe rash involving mucus membranes or skin necrosis: Unk Has patient had a PCN reaction that required hospitalization: Unk Has patient had a PCN reaction occurring within the last 10 years: No If all of the above answers are "NO", then may proceed with Cephalosporin use.      Medication List        Accurate as of 08/03/18  1:22 PM. Always use your most recent med list.          amLODipine 5 MG tablet Commonly known as:  NORVASC   aspirin 81 MG EC tablet Take 1 tablet (81 mg total) by mouth daily.   atorvastatin 80 MG tablet Commonly known as:  LIPITOR Take 1 tablet (80 mg total) by mouth daily at 6 PM. **NEED OFFICE VISIT WITH FASTING LABS FOR REFILLS**   buPROPion 150 MG 24 hr tablet Commonly known as:  WELLBUTRIN XL TAKE ONE TABLET BY  MOUTH ONE TIME DAILY   canagliflozin 300 MG Tabs tablet Commonly known as:  INVOKANA Take 1 tablet (300 mg total) by mouth daily before breakfast.   clopidogrel 75 MG tablet Commonly known as:  PLAVIX Take 1 tablet (75 mg total) by mouth daily.   EPINEPHrine 0.3 mg/0.3 mL Soaj injection Commonly known as:  EPI-PEN Inject 0.3 mLs (0.3 mg total) into the muscle once as needed.   FISH OIL CONCENTRATE 300 MG Caps Take 1 capsule by mouth daily.   fluticasone 50 MCG/ACT nasal spray Commonly known as:  FLONASE Place 1 spray into both nostrils daily as needed for allergies or rhinitis.   FREESTYLE LIBRE 14 DAY SENSOR Misc 2 Devices by Does not apply route every morning.   insulin aspart 100 UNIT/ML FlexPen Commonly known as:  NOVOLOG Inject 10u sq prior to meals and 5u for cbg >250   Insulin Glargine 100 UNIT/ML Solostar Pen Commonly known as:  LANTUS Inject 35 Units into the skin  daily at 10 pm.   isosorbide mononitrate 30 MG 24 hr tablet Commonly known as:  IMDUR Take 3 tablets (90 mg total) by mouth daily.   lisinopril 5 MG tablet Commonly known as:  PRINIVIL,ZESTRIL Take 1 tablet (5 mg total) by mouth at bedtime.   loratadine 10 MG tablet Commonly known as:  CLARITIN Take 1 tablet (10 mg total) by mouth daily.   metoprolol tartrate 25 MG tablet Commonly known as:  LOPRESSOR TAKE ONE TABLET BY MOUTH TWICE A DAY   multivitamin with minerals Tabs tablet Take 1 tablet by mouth daily.   nitroGLYCERIN 0.4 MG SL tablet Commonly known as:  NITROSTAT Place 1 tablet (0.4 mg total) under the tongue every 5 (five) minutes as needed for chest pain.   Pen Needles 32G X 4 MM Misc 1 Units by Does not apply route daily.   ranolazine 500 MG 12 hr tablet Commonly known as:  RANEXA Take 1 tablet (500 mg total) by mouth 2 (two) times daily.   sertraline 50 MG tablet Commonly known as:  ZOLOFT TAKE ONE TABLET BY MOUTH ONE TIME DAILY   TURMERIC PO Take 1 tablet by mouth daily.   varenicline 0.5 MG X 11 & 1 MG X 42 tablet Commonly known as:  CHANTIX PAK Take one 0.5 mg tab po qd x 3d, then increase to one 0.5 mg tab bid x 4 d, then increase to one 1 mg tab bid   CHANTIX 0.5 MG tablet Generic drug:  varenicline TAKE ONE TABLET BY MOUTH TWICE A DAY   zolpidem 10 MG tablet Commonly known as:  AMBIEN Take 1 tablet (10 mg total) by mouth at bedtime as needed for sleep. **NEED OFFICE VISIT FOR ANY ADDITIONAL REFILLS**       Birth History: non-contributory  Developmental History: non-contributory.   Past Surgical History: Past Surgical History:  Procedure Laterality Date  . CARDIAC CATHETERIZATION  10/14/2017  . CORONARY ANGIOPLASTY WITH STENT PLACEMENT  12/30/2004   Patient reported  . CORONARY ARTERY BYPASS GRAFT N/A 10/19/2017   Procedure: CORONARY ARTERY BYPASS GRAFTING (CABG) times four using the right saphaneous vein. Harvested endoscopicly and left  internal mammary artery.;  Surgeon: Grace Isaac, MD;  Location: [place];  Service: Open Heart Surgery;  Laterality: N/A;  . DILATION AND CURETTAGE OF UTERUS  1980s  . LEFT HEART CATH AND CORONARY ANGIOGRAPHY N/A 10/14/2017   Procedure: LEFT HEART CATH AND CORONARY ANGIOGRAPHY;  Surgeon: Martinique, Peter M, MD;  [facility]  INVASIVE CV LAB;  Service: Cardiovascular;  Laterality: N/A;  . LEFT HEART CATH AND CORS/GRAFTS ANGIOGRAPHY N/A 01/01/2018   Procedure: LEFT HEART CATH AND CORS/GRAFTS ANGIOGRAPHY;  Surgeon: Jettie Booze, MD;  Location: [place] CV LAB;  Service: Cardiovascular;  Laterality: N/A;  . PILONIDAL CYST EXCISION  1980s  . TEE WITHOUT CARDIOVERSION N/A 10/19/2017   Procedure: TRANSESOPHAGEAL ECHOCARDIOGRAM (TEE);  Surgeon: Grace Isaac, MD;  Location: [place];  Service: Open Heart Surgery;  Laterality: N/A;     Family History: Family History  Problem Relation Age of Onset  . Heart disease Father   . Hyperlipidemia Father   . Hypertension Father      Social History: Hanin lives at home with her family. They live in a house that is 59 years old. There is wood flooring throughout the home. They have gas heating and central cooling. There are rabbits in the home (two total). There are no dust mite coverings on the bedding. There is no tobacco exposure in the home but she does smoke in the car (currently smokes about three cigarettes per day). 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      Review of Systems: a 14-point review of systems is pertinent for what is mentioned in HPI.  Otherwise, all other systems were negative. Constitutional: negative other than that listed in the HPI Eyes: negative other than that listed in the HPI Ears, nose, mouth, throat, and face: negative other than that listed in the HPI Respiratory: negative other than that listed in the HPI Cardiovascular: negative other than that listed in the  HPI Gastrointestinal: negative other than that listed in the HPI Genitourinary: negative other than that listed in the HPI Integument: negative other than that listed in the HPI Hematologic: negative other than that listed in the HPI Musculoskeletal: negative other than that listed in the HPI Neurological: negative other than that listed in the HPI Allergy/Immunologic: negative other than that listed in the HPI    Objective:   Blood pressure (!) 142/84, pulse 70, temperature 98.2 F (36.8 C), temperature source Oral, resp. rate 16, height 5' 6.5" (1.689 m), weight 197 lb 3.2 oz (89.4 kg), SpO2 98 %. Body mass index is 31.35 kg/m.   Physical Exam:  General: Alert, interactive, in no acute distress. Pleasant and talkative.  Eyes: No conjunctival injection bilaterally, no discharge on the right, no discharge on the left and no Horner-Trantas dots present. PERRL bilaterally. EOMI without pain. No photophobia.  Ears: Right TM pearly gray with normal light reflex, Left TM pearly gray with normal light reflex, Right TM intact without perforation and Left TM intact without perforation.  Nose/Throat: External nose within normal limits and septum midline. Turbinates edematous and pale with clear discharge. Posterior oropharynx erythematous without cobblestoning in the posterior oropharynx. Tonsils 2+ without exudates.  Tongue without thrush. Neck: Supple without thyromegaly. Trachea midline. Adenopathy: no enlarged lymph nodes appreciated in the anterior cervical, occipital, axillary, epitrochlear, inguinal, or popliteal regions. Lungs: Clear to auscultation without wheezing, rhonchi or rales. No increased work of breathing. CV: Normal S1/S2. No murmurs. Capillary refill <2 seconds.  Abdomen: Nondistended, nontender. No guarding or rebound tenderness. Bowel sounds present in all fields and hypoactive  Skin: Warm and dry, without lesions or rashes. Extremities:  No clubbing, cyanosis or  edema. Neuro:   Grossly intact. No focal deficits appreciated. Responsive to questions.  Diagnostic studies:    Allergy Studies:   Airborne Adult  Perc - 08/03/18 1022    Time Antigen Placed  1022    Allergen Manufacturer  Greer    Location  Back    Number of Test  59    Panel 1  Select    1. Control-Buffer 50% Glycerol  Negative    2. Control-Histamine 1 mg/ml  2+    3. Albumin saline  Negative    4. Casper  Negative    5. Guatemala  Negative    6. Johnson  Negative    7. Kentucky Blue  3+    8. Meadow Fescue  2+    9. Perennial Rye  3+    10. Sweet Vernal  3+    11. Timothy  3+    12. Cocklebur  Negative    13. Burweed Marshelder  Negative    14. Ragweed, short  3+    15. Ragweed, Giant  2+    16. Plantain,  English  Negative    17. Lamb's Quarters  Negative    18. Sheep Sorrell  Negative    19. Rough Pigweed  Negative    20. Marsh Elder, Rough  Negative   +/-   21. Mugwort, Common  Negative    22. Ash mix  Negative    23. Birch mix  Negative    24. Beech American  Negative    25. Box, Elder  Negative    26. Cedar, red  Negative    27. Cottonwood, [place]  Negative    28. Elm mix  Negative    29. Hickory mix  Negative    30. Maple mix  Negative    31. Oak, [place] mix  Negative    32. Pecan Pollen  Negative    33. Pine mix  Negative    34. Sycamore Eastern  Negative    35. Marathon, Black Pollen  Negative    36. Alternaria alternata  Negative    37. Cladosporium Herbarum  Negative    38. Aspergillus mix  Negative    39. Penicillium mix  Negative    40. Bipolaris sorokiniana (Helminthosporium)  Negative    41. Drechslera spicifera (Curvularia)  Negative    42. Mucor plumbeus  Negative    43. Fusarium moniliforme  Negative    44. Aureobasidium pullulans (pullulara)  Negative    45. Rhizopus oryzae  Negative    46. Botrytis cinera  Negative    47. Epicoccum nigrum  Negative    48. Phoma betae  Negative    49. Candida Albicans  Negative    50. Trichophyton  mentagrophytes  Negative    51. Mite, D Farinae  5,000 AU/ml  2+    52. Mite, D Pteronyssinus  5,000 AU/ml  Negative    53. Cat Hair 10,000 BAU/ml  Negative    54.  Dog Epithelia  Negative    55. Mixed Feathers  Negative    56. Horse Epithelia  Negative    57. Cockroach, German  Negative    58. Mouse  Negative    59. Tobacco Leaf  Negative     Food Adult Perc - 08/03/18 1000    Time Antigen Placed  [place]    Location  Back    Number of allergen test  8    Panel 2  Select    Control-Histamine 1 mg/ml  2+    8. Shellfish Mix  Negative    25. Shrimp  Negative  26. Crab  Negative    27. Lobster  Negative    28. Oyster  Negative    29. Scallops  Negative    3. Rabbit  2+        Allergy testing results were read and interpreted by myself, documented by clinical staff.       Salvatore Marvel, MD Allergy and [place] [facility]

## 2018-08-03 NOTE — Telephone Encounter (Signed)
While patient was in office she expressed need for written rx.  Patient left office without rx.  LM to see if she would like Flonase rx sent to [facility] pharmacy electronically. Waiting on return call.

## 2018-08-03 NOTE — Telephone Encounter (Signed)
Patient called back and stated that she would like a printed prescription mailed to her.

## 2018-08-04 LAB — URINALYSIS, DIPSTICK ONLY
Bilirubin, UA: NEGATIVE
Ketones, UA: NEGATIVE
Leukocytes, UA: NEGATIVE
Nitrite, UA: NEGATIVE
Protein, UA: NEGATIVE
RBC, UA: NEGATIVE
Specific Gravity, UA: 1.021 (ref 1.005–1.030)
Urobilinogen, Ur: 0.2 mg/dL (ref 0.2–1.0)
pH, UA: 5.5 (ref 5.0–7.5)

## 2018-08-04 LAB — BASIC METABOLIC PANEL
BUN/Creatinine Ratio: 17 (ref 9–23)
BUN: 21 mg/dL (ref 6–24)
CO2: 19 mmol/L — ABNORMAL LOW (ref 20–29)
Calcium: 9.7 mg/dL (ref 8.7–10.2)
Creatinine, Ser: 1.26 mg/dL — ABNORMAL HIGH (ref 0.57–1.00)
GFR calc non Af Amer: 47 mL/min/{1.73_m2} — ABNORMAL LOW (ref >59)
Glucose: 186 mg/dL — ABNORMAL HIGH (ref 65–99)

## 2018-08-04 LAB — BASIC METABOLIC PANEL WITH GFR
Chloride: 99 mmol/L (ref 96–106)
GFR calc Af Amer: 54 mL/min/{1.73_m2} — ABNORMAL LOW (ref >59)
Potassium: 4.5 mmol/L (ref 3.5–5.2)
Sodium: 137 mmol/L (ref 134–144)

## 2018-08-04 LAB — LIPID PANEL
Chol/HDL Ratio: 2.4 ratio (ref 0.0–4.4)
Cholesterol, Total: 163 mg/dL (ref 100–199)
HDL: 67 mg/dL (ref >39)
LDL Calculated: 66 mg/dL (ref 0–99)
Triglycerides: 148 mg/dL (ref 0–149)
VLDL Cholesterol Cal: 30 mg/dL (ref 5–40)

## 2018-08-04 LAB — HEMOGLOBIN A1C
Est. average glucose Bld gHb Est-mCnc: 177 mg/dL
Hgb A1c MFr Bld: 7.8 % — ABNORMAL HIGH (ref 4.8–5.6)

## 2018-08-05 LAB — ALLERGEN PROFILE, SHELLFISH
F023-IGE CRAB: 0.25 kU/L — AB
F080-IgE Lobster: <0.1 kU/L
SHRIMP IGE: 0.49 kU/L — AB

## 2018-08-05 LAB — ALLERGEN, KIWI FRUIT, F84: Kiwi Fruit: <0.1 kU/L

## 2018-08-06 ENCOUNTER — Other Ambulatory Visit: Payer: Self-pay | Admitting: Family Medicine

## 2018-08-06 NOTE — Telephone Encounter (Signed)
Requested medication (s) are due for refill today: yes  Requested medication (s) are on the active medication list: yes  Last refill:  07/08/18  Future visit scheduled: yes  Notes to clinic:  Please review that this is the correct dose of the next step of Chantix   Requested Prescriptions  Pending Prescriptions Disp Refills   CHANTIX 0.5 MG tablet [Pharmacy Med Name: CHANTIX 0.5 MG TAB[**$]] 56 tablet 0    Sig: TAKE ONE TABLET BY MOUTH TWICE A DAY     Psychiatry:  Drug Dependence Therapy Passed - 08/06/2018  1:30 PM      Passed - Valid encounter within last 12 months    Recent Outpatient Visits          3 days ago Essential hypertension   Primary Care at [facility], [place], PA-C   5 months ago Acute gout involving toe of right foot, unspecified cause   Primary Care at [facility], Gelene Mink, PA-C   5 months ago Type 2 diabetes mellitus with complication, with long-term current use of insulin [facility])   Primary [facility] at [facility], Laurey Arrow, MD   7 months ago Type 2 diabetes mellitus with hyperglycemia, without long-term current use of insulin [facility])   Primary Care at [facility], Laurey Arrow, MD   7 months ago Pleuritic chest pain   Primary Care at [place], PA-C      Future Appointments            In 4 weeks Rutherford Guys, MD Primary Care at [place], [facility]   In 1 month Hilty, Nadean Corwin, MD [place], [facility]

## 2018-08-08 ENCOUNTER — Other Ambulatory Visit: Payer: Self-pay | Admitting: Family Medicine

## 2018-08-08 ENCOUNTER — Other Ambulatory Visit: Payer: Self-pay | Admitting: Physician Assistant

## 2018-08-08 DIAGNOSIS — E119 Type 2 diabetes mellitus without complications: Secondary | ICD-10-CM

## 2018-08-09 NOTE — Telephone Encounter (Signed)
Requested Prescriptions  Pending Prescriptions Disp Refills  . NOVOLOG FLEXPEN 100 UNIT/ML FlexPen [Pharmacy Med Name: NOVOLOG FLEXPEN SYR[*R]] 30 mL 1    Sig: INJECT 10 UNITS UNDER SKIN PRIOR TO MEALS AND INJECT 5 UNITS UNDER SKIN FOR CBG >250     Endocrinology:  Diabetes - Insulins Passed - 08/08/2018  7:23 AM      Passed - HBA1C is between 0 and 7.9 and within 180 days    Hgb A1c MFr Bld  Date Value Ref Range Status  08/03/2018 7.8 (H) 4.8 - 5.6 % Final    Comment:             Prediabetes: 5.7 - 6.4          Diabetes: >6.4          Glycemic control for adults with diabetes: <7.0          Passed - Valid encounter within last 6 months    Recent Outpatient Visits          6 days ago Essential hypertension   Primary Care at [facility], [place], PA-C   5 months ago Acute gout involving toe of right foot, unspecified cause   Primary Care at [facility], Gelene Mink, PA-C   5 months ago Type 2 diabetes mellitus with complication, with long-term current use of insulin [facility])   Primary Care at [facility], Laurey Arrow, MD   7 months ago Type 2 diabetes mellitus with hyperglycemia, without long-term current use of insulin [facility])   Primary Care at [facility], Laurey Arrow, MD   7 months ago Pleuritic chest pain   Primary Care at [place], PA-C      Future Appointments            In 3 weeks Rutherford Guys, MD Primary Care at [place], [facility]   In 4 weeks Hilty, Nadean Corwin, MD [place], [facility]

## 2018-08-10 ENCOUNTER — Encounter: Payer: Self-pay | Admitting: Family Medicine

## 2018-08-11 MED ORDER — CLOPIDOGREL BISULFATE 75 MG PO TABS: 75.0000 mg | ORAL_TABLET | Freq: Every day | ORAL | 3 refills | Status: DC

## 2018-08-11 NOTE — Telephone Encounter (Signed)
Received phone call from [facility] pharmacist asking for refill request on chantix. Gave verbal for #[phone] same sig, 5 refills

## 2018-08-11 NOTE — Telephone Encounter (Signed)
Refilled

## 2018-08-12 NOTE — Progress Notes (Signed)
Results released to mychart. Decreased GFR, increased Crea. Advised d/c NSAIDs. Scheduling pool to call pt and have her make appt with Dr. Brigitte Pulse for f/u in 1 month.

## 2018-08-15 ENCOUNTER — Inpatient Hospital Stay (HOSPITAL_COMMUNITY)
Admission: EM | Admit: 2018-08-15 | Discharge: 2018-08-17 | DRG: 287 | Disposition: A | Discharge status: Home / Self Care | Payer: BLUE CROSS/BLUE SHIELD | Attending: Internal Medicine | Admitting: Internal Medicine

## 2018-08-15 ENCOUNTER — Other Ambulatory Visit: Payer: Self-pay

## 2018-08-15 ENCOUNTER — Other Ambulatory Visit: Payer: Self-pay | Admitting: Family Medicine

## 2018-08-15 ENCOUNTER — Emergency Department (HOSPITAL_COMMUNITY): Payer: BLUE CROSS/BLUE SHIELD

## 2018-08-15 ENCOUNTER — Encounter (HOSPITAL_COMMUNITY): Payer: Self-pay | Admitting: Emergency Medicine

## 2018-08-15 DIAGNOSIS — I25110 Atherosclerotic heart disease of native coronary artery with unstable angina pectoris: Secondary | ICD-10-CM | POA: Diagnosis not present

## 2018-08-15 DIAGNOSIS — F17210 Nicotine dependence, cigarettes, uncomplicated: Secondary | ICD-10-CM | POA: Diagnosis present

## 2018-08-15 DIAGNOSIS — Z8349 Family history of other endocrine, nutritional and metabolic diseases: Secondary | ICD-10-CM

## 2018-08-15 DIAGNOSIS — I25810 Atherosclerosis of coronary artery bypass graft(s) without angina pectoris: Secondary | ICD-10-CM | POA: Diagnosis not present

## 2018-08-15 DIAGNOSIS — Z91013 Allergy to seafood: Secondary | ICD-10-CM

## 2018-08-15 DIAGNOSIS — Z8711 Personal history of peptic ulcer disease: Secondary | ICD-10-CM

## 2018-08-15 DIAGNOSIS — R079 Chest pain, unspecified: Secondary | ICD-10-CM | POA: Diagnosis not present

## 2018-08-15 DIAGNOSIS — E1122 Type 2 diabetes mellitus with diabetic chronic kidney disease: Secondary | ICD-10-CM | POA: Diagnosis present

## 2018-08-15 DIAGNOSIS — I200 Unstable angina: Secondary | ICD-10-CM | POA: Diagnosis present

## 2018-08-15 DIAGNOSIS — N182 Chronic kidney disease, stage 2 (mild): Secondary | ICD-10-CM | POA: Diagnosis not present

## 2018-08-15 DIAGNOSIS — Z7902 Long term (current) use of antithrombotics/antiplatelets: Secondary | ICD-10-CM | POA: Diagnosis not present

## 2018-08-15 DIAGNOSIS — Z91018 Allergy to other foods: Secondary | ICD-10-CM | POA: Diagnosis not present

## 2018-08-15 DIAGNOSIS — Z7982 Long term (current) use of aspirin: Secondary | ICD-10-CM

## 2018-08-15 DIAGNOSIS — R5383 Other fatigue: Secondary | ICD-10-CM | POA: Diagnosis not present

## 2018-08-15 DIAGNOSIS — E7800 Pure hypercholesterolemia, unspecified: Secondary | ICD-10-CM | POA: Diagnosis present

## 2018-08-15 DIAGNOSIS — E785 Hyperlipidemia, unspecified: Secondary | ICD-10-CM | POA: Diagnosis present

## 2018-08-15 DIAGNOSIS — Z79899 Other long term (current) drug therapy: Secondary | ICD-10-CM | POA: Diagnosis not present

## 2018-08-15 DIAGNOSIS — Z880 Allergy status to penicillin: Secondary | ICD-10-CM | POA: Diagnosis not present

## 2018-08-15 DIAGNOSIS — Z794 Long term (current) use of insulin: Secondary | ICD-10-CM

## 2018-08-15 DIAGNOSIS — Z7951 Long term (current) use of inhaled steroids: Secondary | ICD-10-CM

## 2018-08-15 DIAGNOSIS — Z888 Allergy status to other drugs, medicaments and biological substances status: Secondary | ICD-10-CM | POA: Diagnosis not present

## 2018-08-15 DIAGNOSIS — Z951 Presence of aortocoronary bypass graft: Secondary | ICD-10-CM

## 2018-08-15 DIAGNOSIS — Z8249 Family history of ischemic heart disease and other diseases of the circulatory system: Secondary | ICD-10-CM | POA: Diagnosis not present

## 2018-08-15 DIAGNOSIS — Z720 Tobacco use: Secondary | ICD-10-CM | POA: Diagnosis present

## 2018-08-15 DIAGNOSIS — I252 Old myocardial infarction: Secondary | ICD-10-CM

## 2018-08-15 DIAGNOSIS — R002 Palpitations: Secondary | ICD-10-CM | POA: Diagnosis not present

## 2018-08-15 DIAGNOSIS — Z955 Presence of coronary angioplasty implant and graft: Secondary | ICD-10-CM | POA: Diagnosis not present

## 2018-08-15 DIAGNOSIS — I10 Essential (primary) hypertension: Secondary | ICD-10-CM | POA: Diagnosis present

## 2018-08-15 DIAGNOSIS — I129 Hypertensive chronic kidney disease with stage 1 through stage 4 chronic kidney disease, or unspecified chronic kidney disease: Secondary | ICD-10-CM | POA: Diagnosis not present

## 2018-08-15 DIAGNOSIS — R0602 Shortness of breath: Secondary | ICD-10-CM | POA: Diagnosis not present

## 2018-08-15 DIAGNOSIS — E118 Type 2 diabetes mellitus with unspecified complications: Secondary | ICD-10-CM

## 2018-08-15 LAB — CBC WITH DIFFERENTIAL/PLATELET
Abs Immature Granulocytes: 0.02 10*3/uL (ref 0.00–0.07)
BASOS PCT: 0 %
Basophils Absolute: 0 10*3/uL (ref 0.0–0.1)
EOS ABS: 0.2 10*3/uL (ref 0.0–0.5)
EOS PCT: 3 %
HCT: 40.4 % (ref 36.0–46.0)
Hemoglobin: 11.9 g/dL — ABNORMAL LOW (ref 12.0–15.0)
Immature Granulocytes: 0 %
LYMPHS ABS: 1.6 10*3/uL (ref 0.7–4.0)
Lymphocytes Relative: 23 %
MCH: 24 pg — ABNORMAL LOW (ref 26.0–34.0)
MCHC: 29.5 g/dL — AB (ref 30.0–36.0)
MCV: 81.5 fL (ref 80.0–100.0)
Monocytes Absolute: 0.6 10*3/uL (ref 0.1–1.0)
Monocytes Relative: 9 %
NRBC: 0 % (ref 0.0–0.2)
Neutro Abs: 4.3 10*3/uL (ref 1.7–7.7)
Neutrophils Relative %: 65 %
PLATELETS: 215 10*3/uL (ref 150–400)
RBC: 4.96 MIL/uL (ref 3.87–5.11)
RDW: 18.7 % — AB (ref 11.5–15.5)
WBC: 6.7 10*3/uL (ref 4.0–10.5)

## 2018-08-15 LAB — GLUCOSE, CAPILLARY: Glucose-Capillary: 183 mg/dL — ABNORMAL HIGH (ref 70–99)

## 2018-08-15 LAB — LIPASE, BLOOD: Lipase: 24 U/L (ref 11–51)

## 2018-08-15 LAB — COMPREHENSIVE METABOLIC PANEL
ALK PHOS: 80 U/L (ref 38–126)
ALT: 29 U/L (ref 0–44)
ANION GAP: 11 (ref 5–15)
AST: 28 U/L (ref 15–41)
Albumin: 3.7 g/dL (ref 3.5–5.0)
BILIRUBIN TOTAL: 0.6 mg/dL (ref 0.3–1.2)
BUN: 20 mg/dL (ref 6–20)
CALCIUM: 9.2 mg/dL (ref 8.9–10.3)
CO2: 21 mmol/L — AB (ref 22–32)
CREATININE: 1.04 mg/dL — AB (ref 0.44–1.00)
Chloride: 102 mmol/L (ref 98–111)
GFR calc Af Amer: >60 mL/min (ref >60)
GFR calc non Af Amer: 58 mL/min — ABNORMAL LOW (ref >60)
Glucose, Bld: 217 mg/dL — ABNORMAL HIGH (ref 70–99)
Potassium: 3.5 mmol/L (ref 3.5–5.1)
SODIUM: 134 mmol/L — AB (ref 135–145)
TOTAL PROTEIN: 7.9 g/dL (ref 6.5–8.1)

## 2018-08-15 LAB — I-STAT TROPONIN, ED: Troponin i, poc: 0.01 ng/mL (ref 0.00–0.08)

## 2018-08-15 LAB — BRAIN NATRIURETIC PEPTIDE: B Natriuretic Peptide: 55.4 pg/mL (ref 0.0–100.0)

## 2018-08-15 MED ORDER — LISINOPRIL 5 MG PO TABS
5.0000 mg | ORAL_TABLET | Freq: Every day | ORAL | Status: DC
  Administered 2018-08-15: 5 mg via ORAL
  Filled 2018-08-15: qty 1

## 2018-08-15 MED ORDER — BUPROPION HCL ER (XL) 150 MG PO TB24
150.0000 mg | ORAL_TABLET | Freq: Every day | ORAL | Status: DC
  Administered 2018-08-16 – 2018-08-17 (×2): 150 mg via ORAL
  Filled 2018-08-15 (×2): qty 1

## 2018-08-15 MED ORDER — ATORVASTATIN CALCIUM 80 MG PO TABS
80.0000 mg | ORAL_TABLET | Freq: Every day | ORAL | Status: DC
  Administered 2018-08-15 – 2018-08-16 (×2): 80 mg via ORAL
  Filled 2018-08-15 (×2): qty 1

## 2018-08-15 MED ORDER — SODIUM CHLORIDE 0.9% FLUSH: 3.0000 mL | INTRAVENOUS | Status: DC | PRN

## 2018-08-15 MED ORDER — ASPIRIN 300 MG RE SUPP: 300.0000 mg | RECTAL | Status: AC

## 2018-08-15 MED ORDER — CLOPIDOGREL BISULFATE 75 MG PO TABS
75.0000 mg | ORAL_TABLET | Freq: Every day | ORAL | Status: DC
  Administered 2018-08-16 – 2018-08-17 (×2): 75 mg via ORAL
  Filled 2018-08-15 (×2): qty 1

## 2018-08-15 MED ORDER — SODIUM CHLORIDE 0.9 % IV SOLN: 250.0000 mL | INTRAVENOUS | Status: DC | PRN

## 2018-08-15 MED ORDER — SODIUM CHLORIDE 0.9 % WEIGHT BASED INFUSION
3.0000 mL/kg/h | INTRAVENOUS | Status: DC
  Administered 2018-08-16: 3 mL/kg/h via INTRAVENOUS

## 2018-08-15 MED ORDER — ADULT MULTIVITAMIN W/MINERALS CH
1.0000 | ORAL_TABLET | Freq: Every day | ORAL | Status: DC
  Administered 2018-08-16 – 2018-08-17 (×2): 1 via ORAL
  Filled 2018-08-15 (×2): qty 1

## 2018-08-15 MED ORDER — FLUTICASONE PROPIONATE 50 MCG/ACT NA SUSP
1.0000 | Freq: Every day | NASAL | Status: DC | PRN
  Filled 2018-08-15: qty 16

## 2018-08-15 MED ORDER — ENOXAPARIN SODIUM 40 MG/0.4ML ~~LOC~~ SOLN: 40.0000 mg | SUBCUTANEOUS | Status: DC

## 2018-08-15 MED ORDER — METOPROLOL TARTRATE 25 MG PO TABS
25.0000 mg | ORAL_TABLET | Freq: Two times a day (BID) | ORAL | Status: DC
  Administered 2018-08-15 – 2018-08-17 (×4): 25 mg via ORAL
  Filled 2018-08-15 (×4): qty 1

## 2018-08-15 MED ORDER — ASPIRIN 81 MG PO CHEW
324.0000 mg | CHEWABLE_TABLET | ORAL | Status: AC
  Administered 2018-08-15: 162 mg via ORAL
  Filled 2018-08-15: qty 4

## 2018-08-15 MED ORDER — INSULIN ASPART 100 UNIT/ML ~~LOC~~ SOLN
0.0000 [IU] | Freq: Three times a day (TID) | SUBCUTANEOUS | Status: DC
  Administered 2018-08-16: 8 [IU] via SUBCUTANEOUS
  Administered 2018-08-16 – 2018-08-17 (×2): 3 [IU] via SUBCUTANEOUS

## 2018-08-15 MED ORDER — ISOSORBIDE MONONITRATE ER 60 MG PO TB24
90.0000 mg | ORAL_TABLET | Freq: Every day | ORAL | Status: DC
  Filled 2018-08-15: qty 1

## 2018-08-15 MED ORDER — NITROGLYCERIN 0.4 MG SL SUBL: 0.4000 mg | SUBLINGUAL_TABLET | SUBLINGUAL | Status: DC | PRN

## 2018-08-15 MED ORDER — RANOLAZINE ER 500 MG PO TB12
500.0000 mg | ORAL_TABLET | Freq: Two times a day (BID) | ORAL | Status: DC
  Administered 2018-08-15 – 2018-08-17 (×4): 500 mg via ORAL
  Filled 2018-08-15 (×4): qty 1

## 2018-08-15 MED ORDER — INSULIN GLARGINE 100 UNIT/ML SOLOSTAR PEN: 35.0000 [IU] | PEN_INJECTOR | Freq: Every day | SUBCUTANEOUS | Status: DC

## 2018-08-15 MED ORDER — ACETAMINOPHEN 325 MG PO TABS
650.0000 mg | ORAL_TABLET | ORAL | Status: DC | PRN
  Administered 2018-08-16 (×2): 650 mg via ORAL
  Filled 2018-08-15 (×2): qty 2

## 2018-08-15 MED ORDER — SERTRALINE HCL 50 MG PO TABS
50.0000 mg | ORAL_TABLET | Freq: Every day | ORAL | Status: DC
  Administered 2018-08-16 – 2018-08-17 (×2): 50 mg via ORAL
  Filled 2018-08-15 (×2): qty 1

## 2018-08-15 MED ORDER — VARENICLINE TARTRATE 0.5 MG PO TABS
0.5000 mg | ORAL_TABLET | Freq: Two times a day (BID) | ORAL | Status: DC
  Administered 2018-08-15 – 2018-08-17 (×4): 0.5 mg via ORAL
  Filled 2018-08-15 (×4): qty 1

## 2018-08-15 MED ORDER — AMLODIPINE BESYLATE 5 MG PO TABS
5.0000 mg | ORAL_TABLET | Freq: Every day | ORAL | Status: DC
  Filled 2018-08-15: qty 1

## 2018-08-15 MED ORDER — SODIUM CHLORIDE 0.9% FLUSH
3.0000 mL | Freq: Two times a day (BID) | INTRAVENOUS | Status: DC
  Administered 2018-08-15 – 2018-08-16 (×2): 3 mL via INTRAVENOUS

## 2018-08-15 MED ORDER — INSULIN ASPART 100 UNIT/ML ~~LOC~~ SOLN: 0.0000 [IU] | Freq: Every day | SUBCUTANEOUS | Status: DC

## 2018-08-15 MED ORDER — ONDANSETRON HCL 4 MG/2ML IJ SOLN: 4.0000 mg | Freq: Four times a day (QID) | INTRAMUSCULAR | Status: DC | PRN

## 2018-08-15 MED ORDER — LORATADINE 10 MG PO TABS
10.0000 mg | ORAL_TABLET | Freq: Every day | ORAL | Status: DC
  Administered 2018-08-16 – 2018-08-17 (×2): 10 mg via ORAL
  Filled 2018-08-15 (×2): qty 1

## 2018-08-15 MED ORDER — ZOLPIDEM TARTRATE 5 MG PO TABS: 5.0000 mg | ORAL_TABLET | Freq: Every evening | ORAL | Status: DC | PRN

## 2018-08-15 MED ORDER — SODIUM CHLORIDE 0.9 % WEIGHT BASED INFUSION
1.0000 mL/kg/h | INTRAVENOUS | Status: DC
  Administered 2018-08-16: 1 mL/kg/h via INTRAVENOUS

## 2018-08-15 MED ORDER — INSULIN GLARGINE 100 UNIT/ML ~~LOC~~ SOLN
35.0000 [IU] | Freq: Every day | SUBCUTANEOUS | Status: DC
  Administered 2018-08-15 – 2018-08-16 (×2): 35 [IU] via SUBCUTANEOUS
  Filled 2018-08-15 (×2): qty 0.35

## 2018-08-15 NOTE — ED Provider Notes (Signed)
[facility] EMERGENCY DEPARTMENT Provider Note   CSN: [id number] Arrival date & time: 08/15/18  1449     History   Chief Complaint Chief Complaint  Patient presents with  . Chest Pain  . Palpitations    HPI Victoria Eaton is a 57 y.o. female.  Patient is a 57 year old female who has a history of prior MI status post bypass surgery in January of this year, diabetes, hypertension, tobacco use.  She presents with palpitations that have been going on for last 2 to 3 days.  She is has some discomfort in her epigastrium and radiating up the center of her chest.  She has been short of breath and fatigued since yesterday.  She has shortness of breath with ambulation.  No cough or cold symptoms.  No fevers.  No increased leg swelling.  She has taken aspirin and nitroglycerin with some improvement in symptoms.     Past Medical History:  Diagnosis Date  . Anxiety   . Arthritis    "maybe in my left foot" (10/14/2017)  . Bladder prolapse, female, acquired 09/14/2017  . Depression   . High cholesterol   . History of stomach ulcers   . Hypertension   . Myocardial infarction ([place]) 12/30/2004  . Tobacco abuse   . Type II diabetes mellitus [facility])     Patient Active Problem List   Diagnosis Date Noted  . Non-ST elevation (NSTEMI) myocardial infarction ([place]) 02/17/2018  . Type 2 diabetes mellitus with complication, with long-term current use of insulin ([place]) 02/17/2018  . Chest pain 01/01/2018  . Unstable angina ([place]) 01/01/2018  . Arm contusion, left, initial encounter 11/25/2017  . Contusion of right knee 11/25/2017  . Hx of CABG 10/19/2017  . Epigastric pain   . Pancreatitis 10/10/2017  . Hyperlipidemia LDL goal <70 09/14/2017  . Essential hypertension 05/01/2017  . Anxiety and depression 05/01/2017  . Tobacco abuse 05/01/2017  . History of MI (myocardial infarction) 05/01/2017  . Allergic rhinitis 05/01/2017  . History of arthritis 05/01/2017  . Bladder  prolapse, female, acquired 11/09/2013    Past Surgical History:  Procedure Laterality Date  . CARDIAC CATHETERIZATION  10/14/2017  . CORONARY ANGIOPLASTY WITH STENT PLACEMENT  12/30/2004   Patient reported  . CORONARY ARTERY BYPASS GRAFT N/A 10/19/2017   Procedure: CORONARY ARTERY BYPASS GRAFTING (CABG) times four using the right saphaneous vein. Harvested endoscopicly and left internal mammary artery.;  Surgeon: Grace Isaac, MD;  Location: [facility];  Service: Open Heart Surgery;  Laterality: N/A;  . DILATION AND CURETTAGE OF UTERUS  1980s  . LEFT HEART CATH AND CORONARY ANGIOGRAPHY N/A 10/14/2017   Procedure: LEFT HEART CATH AND CORONARY ANGIOGRAPHY;  Surgeon: Martinique, Peter M, MD;  Location: [facility];  Service: Cardiovascular;  Laterality: N/A;  . LEFT HEART CATH AND CORS/GRAFTS ANGIOGRAPHY N/A 01/01/2018   Procedure: LEFT HEART CATH AND CORS/GRAFTS ANGIOGRAPHY;  Surgeon: Jettie Booze, MD;  Location: [facility];  Service: Cardiovascular;  Laterality: N/A;  . PILONIDAL CYST EXCISION  1980s  . TEE WITHOUT CARDIOVERSION N/A 10/19/2017   Procedure: TRANSESOPHAGEAL ECHOCARDIOGRAM (TEE);  Surgeon: Grace Isaac, MD;  Location: [facility];  Service: Open Heart Surgery;  Laterality: N/A;     OB History   None      Home Medications    Prior to Admission medications   Medication Sig Start Date End Date Taking? Authorizing Provider  acetaminophen (TYLENOL) 500 MG tablet Take 1,000 mg by mouth every  6 (six) hours as needed for headache (pain).   Yes [provider]  amLODipine (NORVASC) 5 MG tablet Take 5 mg by mouth daily.  06/09/18  Yes [provider]  aspirin 81 MG EC tablet Take 1 tablet (81 mg total) by mouth daily. Patient taking differently: Take 81 mg by mouth at bedtime.  02/11/18  Yes Duke, Tami Lin, PA  atorvastatin (LIPITOR) 80 MG tablet Take 1 tablet (80 mg total) by mouth daily at 6 PM. **NEED OFFICE VISIT WITH FASTING LABS FOR  REFILLS** Patient taking differently: Take 80 mg by mouth at bedtime. **NEED OFFICE VISIT WITH FASTING LABS FOR REFILLS** 06/14/18  Yes Shawnee Knapp, MD  buPROPion (WELLBUTRIN XL) 150 MG 24 hr tablet Take 1 tablet (150 mg total) by mouth daily. 08/03/18  Yes McVey, Gelene Mink, PA-C  canagliflozin (INVOKANA) 300 MG TABS tablet Take 1 tablet (300 mg total) by mouth daily before breakfast. 03/05/18  Yes Shawnee Knapp, MD  CHANTIX 0.5 MG tablet TAKE ONE TABLET BY MOUTH TWICE A DAY Patient taking differently: Take 0.5 mg by mouth 2 (two) times daily.  08/11/18  Yes Shawnee Knapp, MD  clopidogrel (PLAVIX) 75 MG tablet Take 1 tablet (75 mg total) by mouth daily. 08/11/18  Yes Shawnee Knapp, MD  diphenhydramine-acetaminophen (TYLENOL PM) 25-500 MG TABS tablet Take 2 tablets by mouth at bedtime as needed (sleep).   Yes [provider]  EPINEPHrine (EPIPEN 2-PAK) 0.3 mg/0.3 mL IJ SOAJ injection Inject 0.3 mLs (0.3 mg total) into the muscle once as needed. Patient taking differently: Inject 0.3 mg into the muscle once as needed (severe allergic reaction).  05/20/17  Yes Timmothy Euler, [place] D, PA-C  fluticasone (FLONASE) 50 MCG/ACT nasal spray Place 1 spray into both nostrils daily as needed for allergies or rhinitis. Patient taking differently: Place 1 spray into both nostrils at bedtime.  08/03/18  Yes Valentina Shaggy, MD  Insulin Glargine (LANTUS SOLOSTAR) 100 UNIT/ML Solostar Pen Inject 35 Units into the skin daily at 10 pm. Patient taking differently: Inject 35 Units into the skin at bedtime.  03/02/18  Yes Shawnee Knapp, MD  isosorbide mononitrate (IMDUR) 30 MG 24 hr tablet Take 3 tablets (90 mg total) by mouth daily. 02/10/18  Yes Duke, Tami Lin, PA  lisinopril (PRINIVIL,ZESTRIL) 5 MG tablet Take 1 tablet (5 mg total) by mouth at bedtime. 02/17/18  Yes Kilroy, Luke K, PA-C  loratadine (CLARITIN) 10 MG tablet Take 1 tablet (10 mg total) by mouth daily. 05/01/17  Yes Timmothy Euler, [place] D, PA-C    metoprolol tartrate (LOPRESSOR) 25 MG tablet TAKE ONE TABLET BY MOUTH TWICE A DAY Patient taking differently: Take 25 mg by mouth 2 (two) times daily.  05/10/18  Yes Shawnee Knapp, MD  Multiple Vitamin (MULTIVITAMIN WITH MINERALS) TABS tablet Take 1 tablet by mouth daily.   Yes [provider]  nitroGLYCERIN (NITROSTAT) 0.4 MG SL tablet Place 1 tablet (0.4 mg total) under the tongue every 5 (five) minutes as needed for chest pain. 01/19/18 09/14/18 Yes Meng, Hao, PA  NOVOLOG FLEXPEN 100 UNIT/ML FlexPen INJECT 10 UNITS UNDER SKIN PRIOR TO MEALS AND INJECT 5 UNITS UNDER SKIN FOR CBG >250 Patient taking differently: Inject 5-10 Units into the skin See admin instructions. Inject 10 units subcutaneously three times daily before meals, may also inject 5 units up to twice daily as needed for CBG >250 08/09/18  Yes Shawnee Knapp, MD  polyvinyl alcohol (ARTIFICIAL TEARS) 1.4 % ophthalmic  solution Place 1 drop into both eyes 3 (three) times daily as needed for dry eyes.   Yes [provider]  ranolazine (RANEXA) 500 MG 12 hr tablet Take 1 tablet (500 mg total) by mouth 2 (two) times daily. 02/17/18  Yes Kilroy, Luke K, PA-C  sertraline (ZOLOFT) 50 MG tablet TAKE ONE TABLET BY MOUTH ONE TIME DAILY Patient taking differently: Take 50 mg by mouth daily.  12/04/17  Yes Shawnee Knapp, MD  TURMERIC PO Take 1 capsule by mouth daily.    Yes [provider]  Continuous Blood Gluc Sensor (FREESTYLE LIBRE 14 DAY SENSOR) MISC 2 Devices by Does not apply route every morning. 04/26/18   Shawnee Knapp, MD  Insulin Pen Needle (PEN NEEDLES) 32G X 4 MM MISC 1 Units by Does not apply route daily. 03/02/18   Shawnee Knapp, MD  varenicline (CHANTIX STARTING MONTH PAK) 0.5 MG X 11 & 1 MG X 42 tablet Take one 0.5 mg tab po qd x 3d, then increase to one 0.5 mg tab bid x 4 d, then increase to one 1 mg tab bid Patient not taking: Reported on 08/15/2018 04/26/18   Shawnee Knapp, MD  zolpidem (AMBIEN) 10 MG tablet Take 1 tablet  (10 mg total) by mouth at bedtime as needed for sleep. **NEED OFFICE VISIT FOR ANY ADDITIONAL REFILLS** Patient not taking: Reported on 08/15/2018 05/05/18   Shawnee Knapp, MD    Family History Family History  Problem Relation Age of Onset  . Heart disease Father   . Hyperlipidemia Father   . Hypertension Father     Social History Social History   Tobacco Use  . Smoking status: Current Some Day Smoker    Packs/day: 0.12    Years: 45.00    Pack years: 5.40    Types: Cigarettes    Start date: 05/01/1972    Last attempt to quit: 09/30/2017    Years since quitting: 0.8  . Smokeless tobacco: Never Used  Substance Use Topics  . Alcohol use: Yes    Alcohol/week: 14.0 standard drinks    Types: 14 Glasses of wine per week    Comment: 2 glasses red wine at night  . Drug use: No     Allergies   Januvia [sitagliptin]; Kiwi extract; Shrimp [shellfish allergy]; Metformin and related; and Penicillins   Review of Systems Review of Systems  Constitutional: Positive for fatigue. Negative for chills, diaphoresis and fever.  HENT: Negative for congestion, rhinorrhea and sneezing.   Eyes: Negative.   Respiratory: Positive for shortness of breath. Negative for cough and chest tightness.   Cardiovascular: Positive for chest pain and palpitations. Negative for leg swelling.  Gastrointestinal: Negative for abdominal pain, blood in stool, diarrhea, nausea and vomiting.  Genitourinary: Negative for difficulty urinating, flank pain, frequency and hematuria.  Musculoskeletal: Negative for arthralgias and back pain.  Skin: Negative for rash.  Neurological: Negative for dizziness, speech difficulty, weakness, numbness and headaches.     Physical Exam Updated Vital Signs BP (!) 166/78   Pulse 87   Temp 98.6 F (37 C) (Oral)   Resp (!) 22   Ht 5' 6.5" (1.689 m)   Wt 89.6 kg   SpO2 98%   BMI 31.40 kg/m   Physical Exam  Constitutional: She is oriented to person, place, and time. She appears  well-developed and well-nourished.  HENT:  Head: Normocephalic and atraumatic.  Eyes: Pupils are equal, round, and reactive to light.  Neck: Normal range  of motion. Neck supple.  Cardiovascular: Normal rate, regular rhythm and normal heart sounds.  Pulmonary/Chest: Effort normal and breath sounds normal. No respiratory distress. She has no wheezes. She has no rales. She exhibits no tenderness.  Abdominal: Soft. Bowel sounds are normal. There is no tenderness. There is no rebound and no guarding.  Musculoskeletal: Normal range of motion. She exhibits no edema.  Lymphadenopathy:    She has no cervical adenopathy.  Neurological: She is alert and oriented to person, place, and time.  Skin: Skin is warm and dry. No rash noted.  Psychiatric: She has a normal mood and affect.     ED Treatments / Results  Labs (all labs ordered are listed, but only abnormal results are displayed) Labs Reviewed  COMPREHENSIVE METABOLIC PANEL - Abnormal; Notable for the following components:      Result Value   Sodium 134 (*)    CO2 21 (*)    Glucose, Bld 217 (*)    Creatinine, Ser 1.04 (*)    GFR calc non Af Amer 58 (*)    All other components within normal limits  CBC WITH DIFFERENTIAL/PLATELET - Abnormal; Notable for the following components:   Hemoglobin 11.9 (*)    MCH 24.0 (*)    MCHC 29.5 (*)    RDW 18.7 (*)    All other components within normal limits  LIPASE, BLOOD  BRAIN NATRIURETIC PEPTIDE  I-STAT TROPONIN, ED    EKG EKG Interpretation  Date/Time:  Sunday August 15 2018 15:01:46 EST Ventricular Rate:  89 PR Interval:    QRS Duration: 104 QT Interval:  402 QTC Calculation: 490 R Axis:   -2 Text Interpretation:  Sinus rhythm Probable left atrial enlargement Anterior infarct, old Borderline ST depression, lateral leads similar to prior EKGS Confirmed by Malvin Johns [phone]) on 08/15/2018 3:26:29 PM   Radiology Dg Chest 2 View  Result Date: 08/15/2018 CLINICAL DATA:  Acute  chest pain for 2 days. EXAM: CHEST - 2 VIEW COMPARISON:  02/08/2018 FINDINGS: Mild cardiomegaly and CABG changes again noted. There is no evidence of focal airspace disease, pulmonary edema, suspicious pulmonary nodule/mass, pleural effusion, or pneumothorax. No acute bony abnormalities are identified. IMPRESSION: Mild cardiomegaly without evidence of acute cardiopulmonary disease. Electronically Signed   By: Margarette Canada M.D.   On: 08/15/2018 16:01    Procedures Procedures (including critical care time)  Medications Ordered in ED Medications - No data to display   Initial Impression / Assessment and Plan / ED Course  I have reviewed the triage vital signs and the nursing notes.  Pertinent labs & imaging results that were available during my care of the patient were reviewed by me and considered in my medical decision making (see chart for details).     Patient has negative troponin.  Her EKG does not show any acute abnormality.  Her chest x-ray is not concerning.  However given her complex cardiac history, I did consult Dr. Debara Pickett with cardiology who will admit the patient for likely cardiac catheterization.  She is currently chest pain-free.  She took aspirin prior to arrival.  Final Clinical Impressions(s) / ED Diagnoses   Final diagnoses:  Unstable angina [facility])    ED Discharge Orders    None       Malvin Johns, MD 08/15/18 1814

## 2018-08-15 NOTE — ED Notes (Signed)
Attempted report x 1 to 6E

## 2018-08-15 NOTE — ED Triage Notes (Signed)
Pt c/o chest pain that has been going on for approx 2 days- has taken NTG at home, baby asa this am "calmed it down" also has had palpitations-  Hx of CABG, Nstemi in April 11th.

## 2018-08-15 NOTE — H&P (Signed)
ADMISSION H&P NOTE   Patient Name: Victoria Eaton Date of Encounter: 08/15/2018 Cardiologist: Pixie Casino, MD  Chief Complaint   Chest pain  Patient Profile   57 year old female with history of coronary disease with remote PCI the RCA in 2006, recent and STEMI and cath revealing multivessel coronary disease.  Status post CABG x4 in January 2019, readmitted in April with chest pain and noted occlusion of the graft to OM and diagonal.  Presented again with chest pain in May 2019 and no cardiac catheterization was performed.  Now presents with recurrent chest pain.  HPI   Victoria Eaton is a 57 y.o. female who is being seen today for the evaluation of chest pain at the request of Dr. Tamera Punt. with remote PCI the RCA in 2006, recent and STEMI and cath revealing multivessel coronary disease.  Status post CABG x4 in January 2019, readmitted in April with chest pain and noted occlusion of the graft to OM and diagonal.  Presented again with chest pain in May 2019.  Troponin was elevated greater than 3, but no cardiac catheterization was performed.  Medical management was recommended.  She was seen in follow-up in the office and continued to complain of chest pain.  Her last office visit was in August 2019 at which time she continued to complain of chest pain despite adjustment of her medical regimen.  She was scheduled to see me in follow-up since I initially saw her the day after she was consulted on by the fellows, but I have not seen her since then and she has been seen by multiple partners in my group.  She now presents with recurrent chest pain.  Initial troponin is negative.  EKG shows sinus rhythm with prior anterior infarct and borderline lateral ST depression.  Her chest pain is nitrate responsive.  PMHx   Past Medical History:  Diagnosis Date  . Anxiety   . Arthritis    "maybe in my left foot" (10/14/2017)  . Bladder prolapse, female, acquired 09/14/2017  . Depression   . High  cholesterol   . History of stomach ulcers   . Hypertension   . Myocardial infarction ([place]) 12/30/2004  . Tobacco abuse   . Type II diabetes mellitus ([place])     Past Surgical History:  Procedure Laterality Date  . CARDIAC CATHETERIZATION  10/14/2017  . CORONARY ANGIOPLASTY WITH STENT PLACEMENT  12/30/2004   Patient reported  . CORONARY ARTERY BYPASS GRAFT N/A 10/19/2017   Procedure: CORONARY ARTERY BYPASS GRAFTING (CABG) times four using the right saphaneous vein. Harvested endoscopicly and left internal mammary artery.;  Surgeon: Grace Isaac, MD;  Location: [facility];  Service: Open Heart Surgery;  Laterality: N/A;  . DILATION AND CURETTAGE OF UTERUS  1980s  . LEFT HEART CATH AND CORONARY ANGIOGRAPHY N/A 10/14/2017   Procedure: LEFT HEART CATH AND CORONARY ANGIOGRAPHY;  Surgeon: Martinique, Peter M, MD;  Location: [facility];  Service: Cardiovascular;  Laterality: N/A;  . LEFT HEART CATH AND CORS/GRAFTS ANGIOGRAPHY N/A 01/01/2018   Procedure: LEFT HEART CATH AND CORS/GRAFTS ANGIOGRAPHY;  Surgeon: Jettie Booze, MD;  Location: [facility];  Service: Cardiovascular;  Laterality: N/A;  . PILONIDAL CYST EXCISION  1980s  . TEE WITHOUT CARDIOVERSION N/A 10/19/2017   Procedure: TRANSESOPHAGEAL ECHOCARDIOGRAM (TEE);  Surgeon: Grace Isaac, MD;  Location: [facility];  Service: Open Heart Surgery;  Laterality: N/A;    FAMHx   Family History  Problem Relation Age of Onset  .  Heart disease Father   . Hyperlipidemia Father   . Hypertension Father     SOCHx    reports that she has been smoking cigarettes. She started smoking about 46 years ago. She has a 5.40 pack-year smoking history. She has never used smokeless tobacco. She reports that she drinks about 14.0 standard drinks of alcohol per week. She reports that she does not use drugs.  Outpatient Medications   No current facility-administered medications on file prior to encounter.    Current Outpatient Medications on  File Prior to Encounter  Medication Sig Dispense Refill  . amLODipine (NORVASC) 5 MG tablet   2  . aspirin 81 MG EC tablet Take 1 tablet (81 mg total) by mouth daily. 90 tablet 3  . atorvastatin (LIPITOR) 80 MG tablet Take 1 tablet (80 mg total) by mouth daily at 6 PM. **NEED OFFICE VISIT WITH FASTING LABS FOR REFILLS** 90 tablet 1  . buPROPion (WELLBUTRIN XL) 150 MG 24 hr tablet Take 1 tablet (150 mg total) by mouth daily. 90 tablet 0  . canagliflozin (INVOKANA) 300 MG TABS tablet Take 1 tablet (300 mg total) by mouth daily before breakfast. 30 tablet 5  . CHANTIX 0.5 MG tablet TAKE ONE TABLET BY MOUTH TWICE A DAY 56 tablet 5  . clopidogrel (PLAVIX) 75 MG tablet Take 1 tablet (75 mg total) by mouth daily. 90 tablet 3  . Continuous Blood Gluc Sensor (FREESTYLE LIBRE 14 DAY SENSOR) MISC 2 Devices by Does not apply route every morning. 2 each 5  . EPINEPHrine (EPIPEN 2-PAK) 0.3 mg/0.3 mL IJ SOAJ injection Inject 0.3 mLs (0.3 mg total) into the muscle once as needed. 1 Device 1  . fluticasone (FLONASE) 50 MCG/ACT nasal spray Place 1 spray into both nostrils daily as needed for allergies or rhinitis. 16 g 3  . Insulin Glargine (LANTUS SOLOSTAR) 100 UNIT/ML Solostar Pen Inject 35 Units into the skin daily at 10 pm. 30 pen 1  . Insulin Pen Needle (PEN NEEDLES) 32G X 4 MM MISC 1 Units by Does not apply route daily. 400 each 11  . isosorbide mononitrate (IMDUR) 30 MG 24 hr tablet Take 3 tablets (90 mg total) by mouth daily. 90 tablet 11  . lisinopril (PRINIVIL,ZESTRIL) 5 MG tablet Take 1 tablet (5 mg total) by mouth at bedtime. 90 tablet 3  . loratadine (CLARITIN) 10 MG tablet Take 1 tablet (10 mg total) by mouth daily. 90 tablet 3  . metoprolol tartrate (LOPRESSOR) 25 MG tablet TAKE ONE TABLET BY MOUTH TWICE A DAY 180 tablet 1  . Multiple Vitamin (MULTIVITAMIN WITH MINERALS) TABS tablet Take 1 tablet by mouth daily.    . nitroGLYCERIN (NITROSTAT) 0.4 MG SL tablet Place 1 tablet (0.4 mg total) under the  tongue every 5 (five) minutes as needed for chest pain. 25 tablet 3  . NOVOLOG FLEXPEN 100 UNIT/ML FlexPen INJECT 10 UNITS UNDER SKIN PRIOR TO MEALS AND INJECT 5 UNITS UNDER SKIN FOR CBG >250 30 mL 1  . Omega-3 Fatty Acids (FISH OIL CONCENTRATE) 300 MG CAPS Take 1 capsule by mouth daily.    . ranolazine (RANEXA) 500 MG 12 hr tablet Take 1 tablet (500 mg total) by mouth 2 (two) times daily. 90 tablet 3  . sertraline (ZOLOFT) 50 MG tablet TAKE ONE TABLET BY MOUTH ONE TIME DAILY 30 tablet 3  . TURMERIC PO Take 1 tablet by mouth daily.    . varenicline (CHANTIX STARTING MONTH PAK) 0.5 MG X 11 & 1 MG  X 42 tablet Take one 0.5 mg tab po qd x 3d, then increase to one 0.5 mg tab bid x 4 d, then increase to one 1 mg tab bid 53 tablet 0  . zolpidem (AMBIEN) 10 MG tablet Take 1 tablet (10 mg total) by mouth at bedtime as needed for sleep. **NEED OFFICE VISIT FOR ANY ADDITIONAL REFILLS** 30 tablet 0    Inpatient Medications    Scheduled Meds:   Continuous Infusions:   PRN Meds:    ALLERGIES   Allergies  Allergen Reactions  . Januvia [Sitagliptin] Nausea And Vomiting and Other (See Comments)    Pancreatitis (this was in Epic, but patient was unaware of this)   . Kiwi Extract Hives  . Shrimp [Shellfish Allergy] Hives  . Metformin And Related Diarrhea  . Penicillins Other (See Comments)    From childhood: Has patient had a PCN reaction causing immediate rash, facial/tongue/throat swelling, SOB or lightheadedness with hypotension: Unk Has patient had a PCN reaction causing severe rash involving mucus membranes or skin necrosis: Unk Has patient had a PCN reaction that required hospitalization: Unk Has patient had a PCN reaction occurring within the last 10 years: No If all of the above answers are "NO", then may proceed with Cephalosporin use.     ROS   Pertinent items noted in HPI and remainder of comprehensive ROS otherwise negative.  Vitals   Vitals:   08/15/18 1502 08/15/18 1504  08/15/18 1505  BP: (!) 162/93 (!) 162/93   Pulse: 88 88   Resp: (!) 21 18   Temp:  98.6 F (37 C)   TempSrc:  Oral   SpO2: 97% 98%   Weight:   89.6 kg  Height:   5' 6.5" (1.689 m)   No intake or output data in the 24 hours ending 08/15/18 1636 Filed Weights   08/15/18 1505  Weight: 89.6 kg    Physical Exam   General appearance: alert, no distress and moderately obese Neck: no carotid bruit, no JVD and thyroid not enlarged, symmetric, no tenderness/mass/nodules Lungs: clear to auscultation bilaterally Heart: regular rate and rhythm, S1, S2 normal, no murmur, click, rub or gallop Abdomen: soft, non-tender; bowel sounds normal; no masses,  no organomegaly Extremities: extremities normal, atraumatic, no cyanosis or edema Pulses: 2+ and symmetric Skin: Skin color, texture, turgor normal. No rashes or lesions Neurologic: Grossly normal Psych: Pleasant  Labs   Results for orders placed or performed during the hospital encounter of 08/15/18 (from the past 48 hour(s))  Comprehensive metabolic panel     Status: Abnormal   Collection Time: 08/15/18  3:03 PM  Result Value Ref Range   Sodium 134 (L) 135 - 145 mmol/L   Potassium 3.5 3.5 - 5.1 mmol/L   Chloride 102 98 - 111 mmol/L   CO2 21 (L) 22 - 32 mmol/L   Glucose, Bld 217 (H) 70 - 99 mg/dL   BUN 20 6 - 20 mg/dL   Creatinine, Ser 1.04 (H) 0.44 - 1.00 mg/dL   Calcium 9.2 8.9 - 10.3 mg/dL   Total Protein 7.9 6.5 - 8.1 g/dL   Albumin 3.7 3.5 - 5.0 g/dL   AST 28 15 - 41 U/L   ALT 29 0 - 44 U/L   Alkaline Phosphatase 80 38 - 126 U/L   Total Bilirubin 0.6 0.3 - 1.2 mg/dL   GFR calc non Af Amer 58 (L) >60 mL/min   GFR calc Af Amer >60 >60 mL/min    Comment: (NOTE) The  eGFR has been calculated using the CKD EPI equation. This calculation has not been validated in all clinical situations. eGFR's persistently <60 mL/min signify possible Chronic Kidney Disease.    Anion gap 11 5 - 15    Comment: Performed at [facility] [address], [place], [place] [address]  CBC with Differential     Status: Abnormal   Collection Time: 08/15/18  3:03 PM  Result Value Ref Range   WBC 6.7 4.0 - 10.5 K/uL   RBC 4.96 3.87 - 5.11 MIL/uL   Hemoglobin 11.9 (L) 12.0 - 15.0 g/dL   HCT 40.4 36.0 - 46.0 %   MCV 81.5 80.0 - 100.0 fL   MCH 24.0 (L) 26.0 - 34.0 pg   MCHC 29.5 (L) 30.0 - 36.0 g/dL   RDW 18.7 (H) 11.5 - 15.5 %   Platelets 215 150 - 400 K/uL   nRBC 0.0 0.0 - 0.2 %   Neutrophils Relative % 65 %   Neutro Abs 4.3 1.7 - 7.7 K/uL   Lymphocytes Relative 23 %   Lymphs Abs 1.6 0.7 - 4.0 K/uL   Monocytes Relative 9 %   Monocytes Absolute 0.6 0.1 - 1.0 K/uL   Eosinophils Relative 3 %   Eosinophils Absolute 0.2 0.0 - 0.5 K/uL   Basophils Relative 0 %   Basophils Absolute 0.0 0.0 - 0.1 K/uL   Immature Granulocytes 0 %   Abs Immature Granulocytes 0.02 0.00 - 0.07 K/uL    Comment: Performed at [facility] [address] [place], [place] [address]  Lipase, blood     Status: None   Collection Time: 08/15/18  3:03 PM  Result Value Ref Range   Lipase 24 11 - 51 U/L    Comment: Performed at [facility], [place] [address], [place], [place] [address]  Brain natriuretic peptide     Status: None   Collection Time: 08/15/18  3:03 PM  Result Value Ref Range   B Natriuretic Peptide 55.4 0.0 - 100.0 pg/mL    Comment: Performed at [facility], [place] [address], [place], [place] [address]  I-stat troponin, ED     Status: None   Collection Time: 08/15/18  3:19 PM  Result Value Ref Range   Troponin i, poc 0.01 0.00 - 0.08 ng/mL   Comment 3            Comment: Due to the release kinetics of cTnI, a negative result within the first hours of the onset of symptoms does not rule out myocardial infarction with certainty. If myocardial infarction is still suspected, repeat the test at appropriate intervals.     ECG   Sinus rhythm at 89, borderline lateral ST depression, prior anterior infarct- Personally  Reviewed  Telemetry   Sinus rhythm - Personally Reviewed  Radiology   Dg Chest 2 View  Result Date: 08/15/2018 CLINICAL DATA:  Acute chest pain for 2 days. EXAM: CHEST - 2 VIEW COMPARISON:  02/08/2018 FINDINGS: Mild cardiomegaly and CABG changes again noted. There is no evidence of focal airspace disease, pulmonary edema, suspicious pulmonary nodule/mass, pleural effusion, or pneumothorax. No acute bony abnormalities are identified. IMPRESSION: Mild cardiomegaly without evidence of acute cardiopulmonary disease. Electronically Signed   By: Margarette Canada M.D.   On: 08/15/2018 16:01    Cardiac Studies   LEFT HEART CATH AND CORS/GRAFTS ANGIOGRAPHY  Conclusion     Prox LAD to Mid LAD lesion is 90% stenosed. LIMA to LAD  is patent.  Distal LAD to Dist LAD lesion is 85% stenosed, past the insertion of the LIMA.  Ost 1st Diag lesion is 80% stenosed. 1st Diag lesion is 90% stenosed. Lat 1st Diag lesion is 90% stenosed. SVG to diagonal is occluded.  Ost 1st Mrg to 1st Mrg lesion is 100% stenosed. SVG to OM is occluded.  Prox RCA lesion is 80% stenosed. Mid RCA to Dist RCA lesion is 70% stenosed. Dist RCA lesion is 70% stenosed. SVG to PDA is widely patent and provides collaterals to the occluded OM.  The left ventricular systolic function is normal.  LV end diastolic pressure is normal.  The left ventricular ejection fraction is 55-65% by visual estimate.  There is no aortic valve stenosis.   Continue medical therapy.  The grafts to LAD and RCA are patent.    The SVGs to the OM and diagonal are occluded.  Both of these vessels were small in caliber.   Could consider CTO PCI attempt of the OM if she has refractory angina.  However, given how small in caliber the OM vessel is, there may be long term patency issues of a DES.     Impression   Principal Problem:   Unstable angina (HCC) Active Problems:   Essential hypertension   Tobacco abuse   History of MI (myocardial  infarction)   Hyperlipidemia LDL goal <70   Type 2 diabetes mellitus with complication, with long-term current use of insulin (HCC)   Recommendation   1. Ms. Adcox continues to have unstable angina symptoms despite optimization of medical therapy.  Unfortunately she continues to smoke.  That being said she is lost 2/4 bypass grafts from January and had presented in May with recurrent chest pain with an elevated troponin that was significant.  She was not offered cardiac catheterization at that time, despite her last cardiac catheterization in April which suggested the possibility of assessing a chronic total occlusion.  She may have had progressive coronary graft dysfunction or perhaps is ischemic related to her close grafts.  Either way she will need a repeat catheterization for definitive evaluation and possible treatment of her recurrent refractory angina.  Full code  Time Spent Directly with Patient:  I have spent a total of 45 minutes with the patient reviewing hospital notes, telemetry, EKGs, labs and examining the patient as well as establishing an assessment and plan that was discussed personally with the patient.  > 50% of time was spent in direct patient care.  Length of Stay:  LOS: 0 days   Pixie Casino, MD, [facility], [place] Director of the [facility] Lipid [facility] &  Cardiovascular Risk Reduction Clinic Diplomate of the American Board of Clinical Lipidology Attending Cardiologist  Direct Dial: [phone]  Fax: [phone]  Website:  [email or web address] Plater 08/15/2018, 4:36 PM

## 2018-08-16 ENCOUNTER — Telehealth: Payer: Self-pay | Admitting: Family Medicine

## 2018-08-16 ENCOUNTER — Encounter (HOSPITAL_COMMUNITY)
Admission: EM | Disposition: A | Discharge status: Home / Self Care | Payer: Self-pay | Source: Home / Self Care | Attending: Internal Medicine

## 2018-08-16 DIAGNOSIS — Z8711 Personal history of peptic ulcer disease: Secondary | ICD-10-CM | POA: Diagnosis not present

## 2018-08-16 DIAGNOSIS — Z7902 Long term (current) use of antithrombotics/antiplatelets: Secondary | ICD-10-CM | POA: Diagnosis not present

## 2018-08-16 DIAGNOSIS — Z79899 Other long term (current) drug therapy: Secondary | ICD-10-CM | POA: Diagnosis not present

## 2018-08-16 DIAGNOSIS — I25110 Atherosclerotic heart disease of native coronary artery with unstable angina pectoris: Principal | ICD-10-CM

## 2018-08-16 DIAGNOSIS — Z91018 Allergy to other foods: Secondary | ICD-10-CM | POA: Diagnosis not present

## 2018-08-16 DIAGNOSIS — Z951 Presence of aortocoronary bypass graft: Secondary | ICD-10-CM | POA: Diagnosis not present

## 2018-08-16 DIAGNOSIS — Z8249 Family history of ischemic heart disease and other diseases of the circulatory system: Secondary | ICD-10-CM | POA: Diagnosis not present

## 2018-08-16 DIAGNOSIS — Z91013 Allergy to seafood: Secondary | ICD-10-CM | POA: Diagnosis not present

## 2018-08-16 DIAGNOSIS — Z955 Presence of coronary angioplasty implant and graft: Secondary | ICD-10-CM | POA: Diagnosis not present

## 2018-08-16 DIAGNOSIS — Z7982 Long term (current) use of aspirin: Secondary | ICD-10-CM | POA: Diagnosis not present

## 2018-08-16 DIAGNOSIS — I200 Unstable angina: Secondary | ICD-10-CM | POA: Diagnosis not present

## 2018-08-16 DIAGNOSIS — Z794 Long term (current) use of insulin: Secondary | ICD-10-CM | POA: Diagnosis not present

## 2018-08-16 DIAGNOSIS — Z7951 Long term (current) use of inhaled steroids: Secondary | ICD-10-CM | POA: Diagnosis not present

## 2018-08-16 DIAGNOSIS — Z880 Allergy status to penicillin: Secondary | ICD-10-CM | POA: Diagnosis not present

## 2018-08-16 DIAGNOSIS — E785 Hyperlipidemia, unspecified: Secondary | ICD-10-CM | POA: Diagnosis present

## 2018-08-16 DIAGNOSIS — F17210 Nicotine dependence, cigarettes, uncomplicated: Secondary | ICD-10-CM | POA: Diagnosis present

## 2018-08-16 DIAGNOSIS — Z888 Allergy status to other drugs, medicaments and biological substances status: Secondary | ICD-10-CM | POA: Diagnosis not present

## 2018-08-16 DIAGNOSIS — E1122 Type 2 diabetes mellitus with diabetic chronic kidney disease: Secondary | ICD-10-CM | POA: Diagnosis present

## 2018-08-16 DIAGNOSIS — I129 Hypertensive chronic kidney disease with stage 1 through stage 4 chronic kidney disease, or unspecified chronic kidney disease: Secondary | ICD-10-CM | POA: Diagnosis present

## 2018-08-16 DIAGNOSIS — Z8349 Family history of other endocrine, nutritional and metabolic diseases: Secondary | ICD-10-CM | POA: Diagnosis not present

## 2018-08-16 DIAGNOSIS — I25810 Atherosclerosis of coronary artery bypass graft(s) without angina pectoris: Secondary | ICD-10-CM | POA: Diagnosis not present

## 2018-08-16 DIAGNOSIS — E7800 Pure hypercholesterolemia, unspecified: Secondary | ICD-10-CM | POA: Diagnosis present

## 2018-08-16 DIAGNOSIS — N182 Chronic kidney disease, stage 2 (mild): Secondary | ICD-10-CM | POA: Diagnosis not present

## 2018-08-16 DIAGNOSIS — I252 Old myocardial infarction: Secondary | ICD-10-CM | POA: Diagnosis not present

## 2018-08-16 HISTORY — PX: LEFT HEART CATH AND CORS/GRAFTS ANGIOGRAPHY: CATH118250

## 2018-08-16 LAB — BASIC METABOLIC PANEL
ANION GAP: 6 (ref 5–15)
BUN: 21 mg/dL — ABNORMAL HIGH (ref 6–20)
CALCIUM: 9.2 mg/dL (ref 8.9–10.3)
CO2: 25 mmol/L (ref 22–32)
Chloride: 107 mmol/L (ref 98–111)
Creatinine, Ser: 1.21 mg/dL — ABNORMAL HIGH (ref 0.44–1.00)
GFR calc non Af Amer: 48 mL/min — ABNORMAL LOW (ref >60)
GFR, EST AFRICAN AMERICAN: 56 mL/min — AB (ref >60)
GLUCOSE: 199 mg/dL — AB (ref 70–99)
POTASSIUM: 3.8 mmol/L (ref 3.5–5.1)
Sodium: 138 mmol/L (ref 135–145)

## 2018-08-16 LAB — GLUCOSE, CAPILLARY
GLUCOSE-CAPILLARY: 258 mg/dL — AB (ref 70–99)
GLUCOSE-CAPILLARY: 120 mg/dL — AB (ref 70–99)
Glucose-Capillary: 158 mg/dL — ABNORMAL HIGH (ref 70–99)
Glucose-Capillary: 186 mg/dL — ABNORMAL HIGH (ref 70–99)

## 2018-08-16 LAB — LIPID PANEL
Cholesterol: 161 mg/dL (ref 0–200)
HDL: 59 mg/dL (ref >40)
LDL Cholesterol: 70 mg/dL (ref 0–99)
TRIGLYCERIDES: 162 mg/dL — AB (ref <150)
Total CHOL/HDL Ratio: 2.7 RATIO
VLDL: 32 mg/dL (ref 0–40)

## 2018-08-16 LAB — CBC
HCT: 38.4 % (ref 36.0–46.0)
HEMOGLOBIN: 11.8 g/dL — AB (ref 12.0–15.0)
MCH: 24.8 pg — AB (ref 26.0–34.0)
MCHC: 30.7 g/dL (ref 30.0–36.0)
MCV: 80.8 fL (ref 80.0–100.0)
NRBC: 0 % (ref 0.0–0.2)
PLATELETS: 203 10*3/uL (ref 150–400)
RBC: 4.75 MIL/uL (ref 3.87–5.11)
RDW: 18.7 % — ABNORMAL HIGH (ref 11.5–15.5)
WBC: 6.5 10*3/uL (ref 4.0–10.5)

## 2018-08-16 SURGERY — LEFT HEART CATH AND CORS/GRAFTS ANGIOGRAPHY
Anesthesia: LOCAL

## 2018-08-16 MED ORDER — FENTANYL CITRATE (PF) 100 MCG/2ML IJ SOLN
INTRAMUSCULAR | Status: DC | PRN
  Administered 2018-08-16 (×3): 25 ug via INTRAVENOUS

## 2018-08-16 MED ORDER — HEPARIN SODIUM (PORCINE) 1000 UNIT/ML IJ SOLN
INTRAMUSCULAR | Status: AC
  Filled 2018-08-16: qty 1

## 2018-08-16 MED ORDER — VERAPAMIL HCL 2.5 MG/ML IV SOLN
INTRAVENOUS | Status: AC
  Filled 2018-08-16: qty 2

## 2018-08-16 MED ORDER — VERAPAMIL HCL 2.5 MG/ML IV SOLN
INTRAVENOUS | Status: DC | PRN
  Administered 2018-08-16: 10 mL via INTRA_ARTERIAL

## 2018-08-16 MED ORDER — ENOXAPARIN SODIUM 40 MG/0.4ML ~~LOC~~ SOLN
40.0000 mg | SUBCUTANEOUS | Status: DC
  Administered 2018-08-17: 40 mg via SUBCUTANEOUS
  Filled 2018-08-16: qty 0.4

## 2018-08-16 MED ORDER — ISOSORBIDE MONONITRATE ER 60 MG PO TB24
120.0000 mg | ORAL_TABLET | Freq: Every day | ORAL | Status: DC
  Administered 2018-08-16 – 2018-08-17 (×2): 120 mg via ORAL
  Filled 2018-08-16 (×2): qty 2

## 2018-08-16 MED ORDER — MIDAZOLAM HCL 2 MG/2ML IJ SOLN
INTRAMUSCULAR | Status: AC
  Filled 2018-08-16: qty 2

## 2018-08-16 MED ORDER — SODIUM CHLORIDE 0.9 % IV SOLN
INTRAVENOUS | Status: AC
  Administered 2018-08-16: 17:00:00 via INTRAVENOUS

## 2018-08-16 MED ORDER — SODIUM CHLORIDE 0.9% FLUSH: 3.0000 mL | INTRAVENOUS | Status: DC | PRN

## 2018-08-16 MED ORDER — HEPARIN (PORCINE) IN NACL 1000-0.9 UT/500ML-% IV SOLN
INTRAVENOUS | Status: DC | PRN
  Administered 2018-08-16 (×2): 500 mL

## 2018-08-16 MED ORDER — IOHEXOL 350 MG/ML SOLN
INTRAVENOUS | Status: DC | PRN
  Administered 2018-08-16: 80 mL via INTRA_ARTERIAL

## 2018-08-16 MED ORDER — LIDOCAINE HCL (PF) 1 % IJ SOLN
INTRAMUSCULAR | Status: DC | PRN
  Administered 2018-08-16: 2 mL via SUBCUTANEOUS

## 2018-08-16 MED ORDER — ASPIRIN 81 MG PO CHEW
81.0000 mg | CHEWABLE_TABLET | Freq: Once | ORAL | Status: AC
  Administered 2018-08-16: 81 mg via ORAL
  Filled 2018-08-16: qty 1

## 2018-08-16 MED ORDER — MIDAZOLAM HCL 2 MG/2ML IJ SOLN
INTRAMUSCULAR | Status: DC | PRN
  Administered 2018-08-16: 1 mg via INTRAVENOUS

## 2018-08-16 MED ORDER — AMLODIPINE BESYLATE 5 MG PO TABS
7.5000 mg | ORAL_TABLET | Freq: Every day | ORAL | Status: DC
  Administered 2018-08-16: 7.5 mg via ORAL
  Filled 2018-08-16: qty 1

## 2018-08-16 MED ORDER — LIDOCAINE HCL (PF) 1 % IJ SOLN
INTRAMUSCULAR | Status: AC
  Filled 2018-08-16: qty 30

## 2018-08-16 MED ORDER — SODIUM CHLORIDE 0.9% FLUSH
3.0000 mL | Freq: Two times a day (BID) | INTRAVENOUS | Status: DC
  Administered 2018-08-16 – 2018-08-17 (×2): 3 mL via INTRAVENOUS

## 2018-08-16 MED ORDER — HEPARIN (PORCINE) IN NACL 1000-0.9 UT/500ML-% IV SOLN
INTRAVENOUS | Status: AC
  Filled 2018-08-16: qty 1000

## 2018-08-16 MED ORDER — HEPARIN SODIUM (PORCINE) 1000 UNIT/ML IJ SOLN
INTRAMUSCULAR | Status: DC | PRN
  Administered 2018-08-16: 4500 [IU] via INTRAVENOUS

## 2018-08-16 MED ORDER — FENTANYL CITRATE (PF) 100 MCG/2ML IJ SOLN
INTRAMUSCULAR | Status: AC
  Filled 2018-08-16: qty 2

## 2018-08-16 MED ORDER — SODIUM CHLORIDE 0.9 % IV SOLN: 250.0000 mL | INTRAVENOUS | Status: DC | PRN

## 2018-08-16 SURGICAL SUPPLY — 12 items
CATH INFINITI 5 FR JL3.5 (CATHETERS) ×1 IMPLANT
CATH INFINITI 5 FR MPA2 (CATHETERS) ×1 IMPLANT
CATH INFINITI 5FR MULTPACK ANG (CATHETERS) ×1 IMPLANT
DEVICE RAD COMP TR BAND LRG (VASCULAR PRODUCTS) ×1 IMPLANT
ELECT DEFIB PAD ADLT CADENCE (PAD) ×1 IMPLANT
GLIDESHEATH SLEND SS 6F .021 (SHEATH) ×1 IMPLANT
GUIDEWIRE INQWIRE 1.5J.035X260 (WIRE) IMPLANT
INQWIRE 1.5J .035X260CM (WIRE) ×2
KIT HEART LEFT (KITS) ×2 IMPLANT
PACK CARDIAC CATHETERIZATION (CUSTOM PROCEDURE TRAY) ×2 IMPLANT
TRANSDUCER W/STOPCOCK (MISCELLANEOUS) ×2 IMPLANT
TUBING CIL FLEX 10 FLL-RA (TUBING) ×2 IMPLANT

## 2018-08-16 NOTE — H&P (View-Only) (Signed)
Progress Note  Patient Name: Victoria Eaton Date of Encounter: 08/16/2018  Primary Cardiologist:   Pixie Casino, MD   Subjective   Today she denies ever having chest pain this admission .  She reports palpitations and dyspnea.  However, the description yesterday was of pain per ED and admitting MD.   Inpatient Medications    Scheduled Meds: . amLODipine  5 mg Oral Daily  . atorvastatin  80 mg Oral q1800  . buPROPion  150 mg Oral Daily  . clopidogrel  75 mg Oral Daily  . enoxaparin (LOVENOX) injection  40 mg Subcutaneous Q24H  . insulin aspart  0-15 Units Subcutaneous TID WC  . insulin aspart  0-5 Units Subcutaneous QHS  . insulin glargine  35 Units Subcutaneous QHS  . isosorbide mononitrate  90 mg Oral Daily  . lisinopril  5 mg Oral QHS  . loratadine  10 mg Oral Daily  . metoprolol tartrate  25 mg Oral BID  . multivitamin with minerals  1 tablet Oral Daily  . ranolazine  500 mg Oral BID  . sertraline  50 mg Oral Daily  . sodium chloride flush  3 mL Intravenous Q12H  . varenicline  0.5 mg Oral BID   Continuous Infusions: . sodium chloride    . sodium chloride 1 mL/kg/hr (08/16/18 0518)   PRN Meds: sodium chloride, acetaminophen, fluticasone, nitroGLYCERIN, ondansetron (ZOFRAN) IV, sodium chloride flush, zolpidem   Vital Signs    Vitals:   08/15/18 1645 08/15/18 1853 08/15/18 2004 08/16/18 0418  BP: (!) 166/78 (!) 144/85 (!) 162/87 (!) 147/57  Pulse: 87 84 89 74  Resp: (!) 22 (!) 23 16 13   Temp:   98.2 F (36.8 C) 97.8 F (36.6 C)  TempSrc:   Oral Oral  SpO2: 98% 97% 96% 96%  Weight:    89.4 kg  Height:        Intake/Output Summary (Last 24 hours) at 08/16/2018 0805 Last data filed at 08/16/2018 0418 Gross per 24 hour  Intake 360 ml  Output -  Net 360 ml   Filed Weights   08/15/18 1505 08/16/18 0418  Weight: 89.6 kg 89.4 kg    Telemetry    NSR - Personally Reviewed  ECG    NA - Personally Reviewed  Physical Exam   GEN: No acute  distress.   Neck: No  JVD Cardiac: RRR, no murmurs, rubs, or gallops.  Respiratory: Clear  to auscultation bilaterally. GI: Soft, nontender, non-distended  MS: No  edema; No deformity. Neuro:  Nonfocal  Psych: Normal affect   Labs    Chemistry Recent Labs  Lab 08/15/18 1503 08/16/18 0341  NA 134* 138  K 3.5 3.8  CL 102 107  CO2 21* 25  GLUCOSE 217* 199*  BUN 20 21*  CREATININE 1.04* 1.21*  CALCIUM 9.2 9.2  PROT 7.9  --   ALBUMIN 3.7  --   AST 28  --   ALT 29  --   ALKPHOS 80  --   BILITOT 0.6  --   GFRNONAA 58* 48*  GFRAA >60 56*  ANIONGAP 11 6     Hematology Recent Labs  Lab 08/15/18 1503 08/16/18 0341  WBC 6.7 6.5  RBC 4.96 4.75  HGB 11.9* 11.8*  HCT 40.4 38.4  MCV 81.5 80.8  MCH 24.0* 24.8*  MCHC 29.5* 30.7  RDW 18.7* 18.7*  PLT 215 203    Cardiac EnzymesNo results for input(s): TROPONINI in the last 168 hours.  Recent Labs  Lab 08/15/18 1519  TROPIPOC 0.01     BNP Recent Labs  Lab 08/15/18 1503  BNP 55.4     DDimer No results for input(s): DDIMER in the last 168 hours.   Radiology    Dg Chest 2 View  Result Date: 08/15/2018 CLINICAL DATA:  Acute chest pain for 2 days. EXAM: CHEST - 2 VIEW COMPARISON:  02/08/2018 FINDINGS: Mild cardiomegaly and CABG changes again noted. There is no evidence of focal airspace disease, pulmonary edema, suspicious pulmonary nodule/mass, pleural effusion, or pneumothorax. No acute bony abnormalities are identified. IMPRESSION: Mild cardiomegaly without evidence of acute cardiopulmonary disease. Electronically Signed   By: Margarette Canada M.D.   On: 08/15/2018 16:01    Cardiac Studies   NA  Patient Profile     57 y.o. female with history of coronary disease with remote PCI the RCA in 2006, recent and STEMI and cath revealing multivessel coronary disease.  Status post CABG x4 in January 2019, readmitted in April with chest pain and noted occlusion of the graft to OM and diagonal.  Presented again with chest  pain in May 2019 and no cardiac catheterization was performed.  Now presents with recurrent chest pain.  Assessment & Plan    CHEST PAIN:   Trop negative x 1.  Plan for cath today.  Films reviewed. Increased Imdur and could increase Ranexa before discharge.     TOBACCO ABUSE:  Educated.  She is on Zyban and Chantix.    DM:   A1C was 7.8 earlier this month.  Continue current therapy. Follow up per Shawnee Knapp, MD  DYSLIPIDEMIA:   LDL was 70.  Continue current therapy.  Continue high dose statin.   CKD II:   I will hold ACE inhibitor today. [name]  Resume before discharge.     HTN;  BP not controlled.  Increase Norvasc .     For questions or updates, please contact [place] Please consult [email or web address] for contact info under Cardiology/STEMI.   Signed, Minus Breeding, MD  08/16/2018, 8:05 AM

## 2018-08-16 NOTE — Progress Notes (Signed)
Progress Note  Patient Name: Victoria Eaton Date of Encounter: 08/16/2018  Primary Cardiologist:   Pixie Casino, MD   Subjective   Today she denies ever having chest pain this admission .  She reports palpitations and dyspnea.  However, the description yesterday was of pain per ED and admitting MD.   Inpatient Medications    Scheduled Meds: . amLODipine  5 mg Oral Daily  . atorvastatin  80 mg Oral q1800  . buPROPion  150 mg Oral Daily  . clopidogrel  75 mg Oral Daily  . enoxaparin (LOVENOX) injection  40 mg Subcutaneous Q24H  . insulin aspart  0-15 Units Subcutaneous TID WC  . insulin aspart  0-5 Units Subcutaneous QHS  . insulin glargine  35 Units Subcutaneous QHS  . isosorbide mononitrate  90 mg Oral Daily  . lisinopril  5 mg Oral QHS  . loratadine  10 mg Oral Daily  . metoprolol tartrate  25 mg Oral BID  . multivitamin with minerals  1 tablet Oral Daily  . ranolazine  500 mg Oral BID  . sertraline  50 mg Oral Daily  . sodium chloride flush  3 mL Intravenous Q12H  . varenicline  0.5 mg Oral BID   Continuous Infusions: . sodium chloride    . sodium chloride 1 mL/kg/hr (08/16/18 0518)   PRN Meds: sodium chloride, acetaminophen, fluticasone, nitroGLYCERIN, ondansetron (ZOFRAN) IV, sodium chloride flush, zolpidem   Vital Signs    Vitals:   08/15/18 1645 08/15/18 1853 08/15/18 2004 08/16/18 0418  BP: (!) 166/78 (!) 144/85 (!) 162/87 (!) 147/57  Pulse: 87 84 89 74  Resp: (!) 22 (!) 23 16 13   Temp:   98.2 F (36.8 C) 97.8 F (36.6 C)  TempSrc:   Oral Oral  SpO2: 98% 97% 96% 96%  Weight:    89.4 kg  Height:        Intake/Output Summary (Last 24 hours) at 08/16/2018 0805 Last data filed at 08/16/2018 0418 Gross per 24 hour  Intake 360 ml  Output -  Net 360 ml   Filed Weights   08/15/18 1505 08/16/18 0418  Weight: 89.6 kg 89.4 kg    Telemetry    NSR - Personally Reviewed  ECG    NA - Personally Reviewed  Physical Exam   GEN: No acute  distress.   Neck: No  JVD Cardiac: RRR, no murmurs, rubs, or gallops.  Respiratory: Clear  to auscultation bilaterally. GI: Soft, nontender, non-distended  MS: No  edema; No deformity. Neuro:  Nonfocal  Psych: Normal affect   Labs    Chemistry Recent Labs  Lab 08/15/18 1503 08/16/18 0341  NA 134* 138  K 3.5 3.8  CL 102 107  CO2 21* 25  GLUCOSE 217* 199*  BUN 20 21*  CREATININE 1.04* 1.21*  CALCIUM 9.2 9.2  PROT 7.9  --   ALBUMIN 3.7  --   AST 28  --   ALT 29  --   ALKPHOS 80  --   BILITOT 0.6  --   GFRNONAA 58* 48*  GFRAA >60 56*  ANIONGAP 11 6     Hematology Recent Labs  Lab 08/15/18 1503 08/16/18 0341  WBC 6.7 6.5  RBC 4.96 4.75  HGB 11.9* 11.8*  HCT 40.4 38.4  MCV 81.5 80.8  MCH 24.0* 24.8*  MCHC 29.5* 30.7  RDW 18.7* 18.7*  PLT 215 203    Cardiac EnzymesNo results for input(s): TROPONINI in the last 168 hours.  Recent Labs  Lab 08/15/18 1519  TROPIPOC 0.01     BNP Recent Labs  Lab 08/15/18 1503  BNP 55.4     DDimer No results for input(s): DDIMER in the last 168 hours.   Radiology    Dg Chest 2 View  Result Date: 08/15/2018 CLINICAL DATA:  Acute chest pain for 2 days. EXAM: CHEST - 2 VIEW COMPARISON:  02/08/2018 FINDINGS: Mild cardiomegaly and CABG changes again noted. There is no evidence of focal airspace disease, pulmonary edema, suspicious pulmonary nodule/mass, pleural effusion, or pneumothorax. No acute bony abnormalities are identified. IMPRESSION: Mild cardiomegaly without evidence of acute cardiopulmonary disease. Electronically Signed   By: Margarette Canada M.D.   On: 08/15/2018 16:01    Cardiac Studies   NA  Patient Profile     57 y.o. female with history of coronary disease with remote PCI the RCA in 2006, recent and STEMI and cath revealing multivessel coronary disease.  Status post CABG x4 in January 2019, readmitted in April with chest pain and noted occlusion of the graft to OM and diagonal.  Presented again with chest  pain in May 2019 and no cardiac catheterization was performed.  Now presents with recurrent chest pain.  Assessment & Plan    CHEST PAIN:   Trop negative x 1.  Plan for cath today.  Films reviewed. Increased Imdur and could increase Ranexa before discharge.     TOBACCO ABUSE:  Educated.  She is on Zyban and Chantix.    DM:   A1C was 7.8 earlier this month.  Continue current therapy. Follow up per Shawnee Knapp, MD  DYSLIPIDEMIA:   LDL was 70.  Continue current therapy.  Continue high dose statin.   CKD II:   I will hold ACE inhibitor today. [name]  Resume before discharge.     HTN;  BP not controlled.  Increase Norvasc .     For questions or updates, please contact [place] Please consult [email or web address] for contact info under Cardiology/STEMI.   Signed, Minus Breeding, MD  08/16/2018, 8:05 AM

## 2018-08-16 NOTE — Interval H&P Note (Signed)
History and Physical Interval Note:  08/16/2018 3:58 PM  Victoria Eaton  has presented today for cardiac catheterization, with the diagnosis of unstable angina.The various methods of treatment have been discussed with the patient and family. After consideration of risks, benefits and other options for treatment, the patient has consented to  Procedure(s): LEFT HEART CATH AND CORS/GRAFTS ANGIOGRAPHY (N/A) as a surgical intervention .  The patient's history has been reviewed, patient examined, no change in status, stable for surgery.  I have reviewed the patient's chart and labs.  Questions were answered to the patient's satisfaction.    Cath Lab Visit (complete for each Cath Lab visit)  Clinical Evaluation Leading to the Procedure:   ACS: Yes.    Non-ACS:  N/A  [name]

## 2018-08-16 NOTE — Telephone Encounter (Signed)
Call placed to [facility].  Advised pharmacy technician that pt. is currently in the hospital, and cannot authorize 90 day supply of this medication, until pt. receives discharge orders.

## 2018-08-16 NOTE — Telephone Encounter (Signed)
Pt has been admitted to [facility]. Please review for refill of invokana 300 mg.

## 2018-08-16 NOTE — Telephone Encounter (Signed)
Copied from [place] [id number]. Topic: Quick Communication - Rx Refill/Question >> Aug 16, 2018  9:20 AM Virl Axe D wrote: Medication: sertraline (ZOLOFT) 50 MG tablet / Pharmacy is requesting to change Rx from 30 day supply to 90 day supply to make it easier for Pt to pick up all her Rxs and it would be more cost effective for Pt. Please advise  Has the patient contacted their pharmacy? Yes.   (Agent: If no, request that the patient contact the pharmacy for the refill.) (Agent: If yes, when and what did the pharmacy advise?)  Preferred Pharmacy (with phone number or street name): [facility] [address] [place], [place]. [phone] (Phone) [phone] (Fax)    Agent: Please be advised that RX refills may take up to 3 business days. We ask that you follow-up with your pharmacy.

## 2018-08-17 ENCOUNTER — Encounter (HOSPITAL_COMMUNITY): Payer: Self-pay | Admitting: Internal Medicine

## 2018-08-17 ENCOUNTER — Telehealth: Payer: Self-pay | Admitting: Physician Assistant

## 2018-08-17 ENCOUNTER — Other Ambulatory Visit: Payer: Self-pay | Admitting: Medical

## 2018-08-17 DIAGNOSIS — N182 Chronic kidney disease, stage 2 (mild): Secondary | ICD-10-CM

## 2018-08-17 LAB — GLUCOSE, CAPILLARY: Glucose-Capillary: 177 mg/dL — ABNORMAL HIGH (ref 70–99)

## 2018-08-17 MED ORDER — AMLODIPINE BESYLATE 10 MG PO TABS: 10.0000 mg | ORAL_TABLET | Freq: Every day | ORAL | 3 refills | Status: DC

## 2018-08-17 MED ORDER — AMLODIPINE BESYLATE 10 MG PO TABS
10.0000 mg | ORAL_TABLET | Freq: Every day | ORAL | Status: DC
  Administered 2018-08-17: 10 mg via ORAL
  Filled 2018-08-17: qty 1

## 2018-08-17 MED ORDER — ASPIRIN EC 81 MG PO TBEC
81.0000 mg | DELAYED_RELEASE_TABLET | Freq: Every day | ORAL | Status: DC
  Administered 2018-08-17: 81 mg via ORAL
  Filled 2018-08-17: qty 1

## 2018-08-17 MED ORDER — METOPROLOL TARTRATE 50 MG PO TABS: 50.0000 mg | ORAL_TABLET | Freq: Two times a day (BID) | ORAL | Status: DC

## 2018-08-17 MED ORDER — METOPROLOL TARTRATE 50 MG PO TABS: 50.0000 mg | ORAL_TABLET | Freq: Two times a day (BID) | ORAL | 3 refills | Status: DC

## 2018-08-17 MED ORDER — ISOSORBIDE MONONITRATE ER 120 MG PO TB24: 120.0000 mg | ORAL_TABLET | Freq: Every day | ORAL | 3 refills | Status: DC

## 2018-08-17 NOTE — Telephone Encounter (Signed)
  TOC appt 08/27/18 10:00 with [facility] per Roby Lofts

## 2018-08-17 NOTE — Progress Notes (Signed)
Patient received discharge information and acknowledged understanding of it. Patient IVs were removed.

## 2018-08-17 NOTE — Consult Note (Signed)
Discharge Summary    Patient ID: Victoria Eaton,  MRN: [id number], DOB/AGE: 1961-02-10 57 y.o.  Admit date: 08/15/2018 Discharge date: 08/17/2018  Primary Care Provider: Shawnee Knapp Primary Cardiologist: Pixie Casino, MD  Discharge Diagnoses    Principal Problem:   Unstable angina [facility]) Active Problems:   Essential hypertension   Tobacco abuse   History of MI (myocardial infarction)   Hyperlipidemia LDL goal <70   Type 2 diabetes mellitus with complication, with long-term current use of insulin (HCC)   Allergies Allergies  Allergen Reactions  . Januvia [Sitagliptin] Nausea And Vomiting and Other (See Comments)    Pancreatitis (this was in Epic, but patient was unaware of this)   . Kiwi Extract Hives  . Shrimp [Shellfish Allergy] Hives  . Metformin And Related Diarrhea  . Penicillins Other (See Comments)    Unknown childhood reaction Has patient had a PCN reaction causing immediate rash, facial/tongue/throat swelling, SOB or lightheadedness with hypotension: Unknown Has patient had a PCN reaction causing severe rash involving mucus membranes or skin necrosis: Unknown Has patient had a PCN reaction that required hospitalization: Unknown Has patient had a PCN reaction occurring within the last 10 years: No If all of the above answers are "NO", then may proceed with Cephalosporin use.     Diagnostic Studies/Procedures    Left heart catheterization 08/16/18: 1. Severe native coronary artery disease.  Since the last catheterization in April, the distal LAD is now occluded (supplied by patent SVG).  Otherwise, there has been no significant interval change, though distal LAD disease appears slightly less pronounced on today's angiogram. 2. Patent LIMA-LAD and SVG-RCA. 3. Chronically occluded SVG-D1 and SVG-OM1. 4. Normal left ventricular systolic function and filling pressure.  Recommendations: 1. Continue medical therapy and aggressive secondary  prevention. 2. PCI to CTO of OM1 could be considered (as previously discussed in Dr. Hassell Done cath report n 01/01/2018) if refractory symptoms are noted.  Recommend dual antiplatelet therapy with Aspirin 42m daily and Clopidogrel 766mdaily long-term (beyond 12 months) because of extensive multivessel CAD.   _____________   History of Present Illness     [age].o. female who presented 08/15/18 with chest pain. She had a remote PCI the RCA in 2006, and recent STEMI and cath revealing multivessel coronary disease.  Status post CABG x4 in January 2019, readmitted in April with chest pain and noted occlusion of the graft to OM and diagonal.  Presented again with chest pain in May 2019.  Troponin was elevated greater than 3, but no cardiac catheterization was performed.  Medical management was recommended.  She was seen in follow-up in the office and continued to complain of chest pain.  Her last office visit was in August 2019 at which time she continued to complain of chest pain despite adjustment of her medical regimen.  She was scheduled to see Dr. HiDebara Pickettn follow-up since he initially saw her the day after she was consulted on by the fellows, but he has not seen her since then and [facility] She now presents with recurrent chest pain.  Initial troponin is negative.  EKG shows sinus rhythm with prior anterior infarct and borderline lateral ST depression.  Her chest pain is nitrate responsive.  Hospital Course     Consultants: None   1. Chest pain in patient with CAD s/p CABG 09/2017: patient presented with chest pain. Multiple admission/ED visits/outpatient visits for similar complaints since CABG  09/2017. Prior to this admission she underwent [facility] 12/2017 which showed occlusion of SVG to OM and diagonal recommended for medical management at that time. This admission troponin was negative x1. EKG non-ischemic. She underwent LHC 08/16/18 which showed new  distal LAD occlusion (supplied by patent SVG), otherwise no significant change. She was recommended for continue medical management and aggressive seconday prevention. Consideration for PCI to CTO of OM1 to be made if refractory symptoms. Her amlodipine was increased to 81m daily, metoprolol increased to 57mBID, and imdur increased to 12080maily for improved antianginal effects.  - Continue aspirin, plavix, and statin - Continue imdur 120m38mily, ranexa 500mg73m, metoprolol 50mg 43m and amlodipine 10mg d90m  2. HTN: BP poorly controlled this admission. Amlodipine uptitrated to 10mg da33mand metoprolol increased to 50mg BID64mme lisinopril held at discharge given elevated CR.  - Continue amlodipine and metoprolol - Recheck BMET next week to determine if okay to resume home lisinopril.   3. DM type 2: A1C 7.8 07/2018; goal <7 - Continue close outpatient follow-up with Dr. Shaw for Brigitte Pulseing management of DM  4. CKD stage 2: Cr 1.2 on the day of discharge; baseline ~1. Home lisinopril held on discharge  - Recheck BMET next week to determine if okay to resume home lisinopril  5. Tobacco abuse: patient educated on importance of smoking cessation. She is trying to quit - Continue zyban and chantix _____________  Discharge Vitals Blood pressure (!) 164/88, pulse 78, temperature 98.2 F (36.8 C), temperature source Oral, resp. rate 18, height 5' 6.5" (1.689 m), weight 88.1 kg, SpO2 97 %.  Filed Weights   08/15/18 1505 08/16/18 0418 08/17/18 0500  Weight: 89.6 kg 89.4 kg 88.1 kg    Labs & Radiologic Studies    CBC Recent Labs    08/15/18 1503 08/16/18 0341  WBC 6.7 6.5  NEUTROABS 4.3  --   HGB 11.9* 11.8*  HCT 40.4 38.4  MCV 81.5 80.8  PLT 215 203   Bas409Metabolic Panel Recent Labs    08/15/18 1503 08/16/18 0341  NA 134* 138  K 3.5 3.8  CL 102 107  CO2 21* 25  GLUCOSE 217* 199*  BUN 20 21*  CREATININE 1.04* 1.21*  CALCIUM 9.2 9.2   Liver Function Tests Recent  Labs    08/15/18 1503  AST 28  ALT 29  ALKPHOS 80  BILITOT 0.6  PROT 7.9  ALBUMIN 3.7   Recent Labs    08/15/18 1503  LIPASE 24   Cardiac Enzymes No results for input(s): CKTOTAL, CKMB, CKMBINDEX, TROPONINI in the last 72 hours. BNP Invalid input(s): POCBNP D-Dimer No results for input(s): DDIMER in the last 72 hours. Hemoglobin A1C No results for input(s): HGBA1C in the last 72 hours. Fasting Lipid Panel Recent Labs    08/16/18 0341  CHOL 161  HDL 59  LDLCALC 70  TRIG 162*  CHOLHDL 2.7   Thyroid Function Tests No results for input(s): TSH, T4TOTAL, T3FREE, THYROIDAB in the last 72 hours.  Invalid input(s): FREET3 _____________  Dg Chest 2 View  Result Date: 08/15/2018 CLINICAL DATA:  Acute chest pain for 2 days. EXAM: CHEST - 2 VIEW COMPARISON:  02/08/2018 FINDINGS: Mild cardiomegaly and CABG changes again noted. There is no evidence of focal airspace disease, pulmonary edema, suspicious pulmonary nodule/mass, pleural effusion, or pneumothorax. No acute bony abnormalities are identified. IMPRESSION: Mild cardiomegaly without evidence of acute cardiopulmonary disease. Electronically Signed   By: Jeffrey  Margarette Canadan:  08/15/2018 16:01   Disposition   Patient was seen and examined by Dr. Percival Spanish who deemed patient as stable for discharge. Follow-up has been arranged. Discharge medications as listed below.   Follow-up Plans & Appointments    Follow-up Information    [facility], [place] Follow up on 08/27/2018.   Specialties:  Cardiology, Radiology Why:  Please arrive 15 minutes early for your 10:00am post-hospital cardiology follow-up appointment Contact information: [address] [place] [address] [phone]        [facility] Follow up on 08/23/2018.   Specialty:  Cardiology Why:  Please present anytime between 8am and 4pm on 08/23/18 to have your blood work checked to monitor your kidney function.  Contact information: [address] [place] [phone]         Discharge Instructions    Diet - low sodium heart healthy   Complete by:  As directed    Increase activity slowly   Complete by:  As directed       Discharge Medications   Allergies as of 08/17/2018      Reactions   Januvia [sitagliptin] Nausea And Vomiting, Other (See Comments)   Pancreatitis (this was in Epic, but patient was unaware of this)   Kiwi Extract Hives   Shrimp [shellfish Allergy] Hives   Metformin And Related Diarrhea   Penicillins Other (See Comments)   Unknown childhood reaction Has patient had a PCN reaction causing immediate rash, facial/tongue/throat swelling, SOB or lightheadedness with hypotension: Unknown Has patient had a PCN reaction causing severe rash involving mucus membranes or skin necrosis: Unknown Has patient had a PCN reaction that required hospitalization: Unknown Has patient had a PCN reaction occurring within the last 10 years: No If all of the above answers are "NO", then may proceed with Cephalosporin use.      Medication List    STOP taking these medications   lisinopril 5 MG tablet Commonly known as:  PRINIVIL,ZESTRIL     TAKE these medications   acetaminophen 500 MG tablet Commonly known as:  TYLENOL Take 1,000 mg by mouth every 6 (six) hours as needed for headache (pain).   amLODipine 10 MG tablet Commonly known as:  NORVASC Take 1 tablet (10 mg total) by mouth daily. Start taking on:  08/18/2018 What changed:    medication strength  how much to take   ARTIFICIAL TEARS 1.4 % ophthalmic solution Generic drug:  polyvinyl alcohol Place 1 drop into both eyes 3 (three) times daily as needed for dry eyes.   aspirin 81 MG EC tablet Take 1 tablet (81 mg total) by mouth daily. What changed:  when to take this   atorvastatin 80 MG tablet Commonly known as:  LIPITOR Take 1 tablet (80 mg total) by mouth daily at 6 PM. **NEED OFFICE VISIT WITH  FASTING LABS FOR REFILLS** What changed:  when to take this   buPROPion 150 MG 24 hr tablet Commonly known as:  WELLBUTRIN XL Take 1 tablet (150 mg total) by mouth daily.   canagliflozin 300 MG Tabs tablet Commonly known as:  INVOKANA Take 1 tablet (300 mg total) by mouth daily before breakfast.   clopidogrel 75 MG tablet Commonly known as:  PLAVIX Take 1 tablet (75 mg total) by mouth daily.   diphenhydramine-acetaminophen 25-500 MG Tabs tablet Commonly known as:  TYLENOL PM Take 2 tablets by mouth at bedtime as needed (sleep).   EPINEPHrine 0.3 mg/0.3 mL Soaj injection Commonly  known as:  EPI-PEN Inject 0.3 mLs (0.3 mg total) into the muscle once as needed. What changed:  reasons to take this   fluticasone 50 MCG/ACT nasal spray Commonly known as:  FLONASE Place 1 spray into both nostrils daily as needed for allergies or rhinitis. What changed:  when to take this   FREESTYLE LIBRE [place] 2 Devices by Does not apply route every morning.   Insulin Glargine 100 UNIT/ML Solostar Pen Commonly known as:  LANTUS Inject 35 Units into the skin daily at 10 pm. What changed:  when to take this   isosorbide mononitrate 120 MG 24 hr tablet Commonly known as:  IMDUR Take 1 tablet (120 mg total) by mouth daily. Start taking on:  08/18/2018 What changed:    medication strength  how much to take   loratadine 10 MG tablet Commonly known as:  CLARITIN Take 1 tablet (10 mg total) by mouth daily.   metoprolol tartrate 50 MG tablet Commonly known as:  LOPRESSOR Take 1 tablet (50 mg total) by mouth 2 (two) times daily. What changed:    medication strength  how much to take   multivitamin with minerals Tabs tablet Take 1 tablet by mouth daily.   nitroGLYCERIN 0.4 MG SL tablet Commonly known as:  NITROSTAT Place 1 tablet (0.4 mg total) under the tongue every 5 (five) minutes as needed for chest pain.   NOVOLOG FLEXPEN 100 UNIT/ML FlexPen Generic drug:  insulin  aspart INJECT 10 UNITS UNDER SKIN PRIOR TO MEALS AND INJECT 5 UNITS UNDER SKIN FOR CBG >250 What changed:  See the new instructions.   Pen Needles 32G X 4 MM Misc 1 Units by Does not apply route daily.   ranolazine 500 MG 12 hr tablet Commonly known as:  RANEXA Take 1 tablet (500 mg total) by mouth 2 (two) times daily.   sertraline 50 MG tablet Commonly known as:  ZOLOFT TAKE ONE TABLET BY MOUTH ONE TIME DAILY   TURMERIC PO Take 1 capsule by mouth daily.   varenicline 0.5 MG X 11 & 1 MG X 42 tablet Commonly known as:  CHANTIX PAK Take one 0.5 mg tab po qd x 3d, then increase to one 0.5 mg tab bid x 4 d, then increase to one 1 mg tab bid What changed:  Another medication with the same name was changed. Make sure you understand how and when to take each.   CHANTIX 0.5 MG tablet Generic drug:  varenicline TAKE ONE TABLET BY MOUTH TWICE A DAY What changed:  how much to take   zolpidem 10 MG tablet Commonly known as:  AMBIEN Take 1 tablet (10 mg total) by mouth at bedtime as needed for sleep. **NEED OFFICE VISIT FOR ANY ADDITIONAL REFILLS**        Aspirin prescribed at discharge?  Yes High Intensity Statin Prescribed? (Lipitor 40-92m or Crestor 20-411m: Yes Beta Blocker Prescribed? Yes For EF <40%, was ACEI/ARB Prescribed? No: Cr above baseline ADP Receptor Inhibitor Prescribed? (i.e. Plavix etc.-Includes Medically Managed Patients): Yes For EF <40%, Aldosterone Inhibitor Prescribed? No: EF >40% Was EF assessed during THIS hospitalization? No Was Cardiac Rehab II ordered? (Included Medically managed Patients): No   Outstanding Labs/Studies   BMET 12/2  Duration of Discharge Encounter   Greater than 30 minutes including physician time.  Signed, KrAbigail ButtsA-C 08/17/2018, 10:11 AM

## 2018-08-17 NOTE — Telephone Encounter (Signed)
Current admission

## 2018-08-17 NOTE — Progress Notes (Signed)
Progress Note  Patient Name: Victoria Eaton Date of Encounter: 08/17/2018  Primary Cardiologist:   Victoria Casino, MD   Subjective   No acute chest pain.  She has a lingering sensation that something was there.  Inpatient Medications    Scheduled Meds: . amLODipine  7.5 mg Oral Daily  . atorvastatin  80 mg Oral q1800  . buPROPion  150 mg Oral Daily  . clopidogrel  75 mg Oral Daily  . enoxaparin (LOVENOX) injection  40 mg Subcutaneous Q24H  . insulin aspart  0-15 Units Subcutaneous TID WC  . insulin aspart  0-5 Units Subcutaneous QHS  . insulin glargine  35 Units Subcutaneous QHS  . isosorbide mononitrate  120 mg Oral Daily  . loratadine  10 mg Oral Daily  . metoprolol tartrate  25 mg Oral BID  . multivitamin with minerals  1 tablet Oral Daily  . ranolazine  500 mg Oral BID  . sertraline  50 mg Oral Daily  . sodium chloride flush  3 mL Intravenous Q12H  . varenicline  0.5 mg Oral BID   Continuous Infusions: . sodium chloride     PRN Meds: sodium chloride, acetaminophen, fluticasone, nitroGLYCERIN, ondansetron (ZOFRAN) IV, sodium chloride flush, zolpidem   Vital Signs    Vitals:   08/17/18 0110 08/17/18 0140 08/17/18 0240 08/17/18 0500  BP: (!) 144/80 (!) 155/81 (!) 169/83 (!) 143/63  Pulse: 66 65 63 63  Resp:    18  Temp:    97.9 F (36.6 C)  TempSrc:    Oral  SpO2: 94% 95% 94% 95%  Weight:    88.1 kg  Height:        Intake/Output Summary (Last 24 hours) at 08/17/2018 0807 Last data filed at 08/16/2018 1400 Gross per 24 hour  Intake 0 ml  Output -  Net 0 ml   Filed Weights   08/15/18 1505 08/16/18 0418 08/17/18 0500  Weight: 89.6 kg 89.4 kg 88.1 kg    Telemetry    NSR, atrial ectopy - Personally Reviewed  ECG    NA - Personally Reviewed  Physical Exam   GEN: No  acute distress.   Neck: No  JVD Cardiac: RRR, no murmurs, rubs, or gallops.  Respiratory: Clear   to auscultation bilaterally. GI: Soft, nontender, non-distended, normal bowel  sounds  MS:  No edema; No deformity. Left radial site without bruising or bleeding Neuro:   Nonfocal  Psych: Oriented and appropriate    Labs    Chemistry Recent Labs  Lab 08/15/18 1503 08/16/18 0341  NA 134* 138  K 3.5 3.8  CL 102 107  CO2 21* 25  GLUCOSE 217* 199*  BUN 20 21*  CREATININE 1.04* 1.21*  CALCIUM 9.2 9.2  PROT 7.9  --   ALBUMIN 3.7  --   AST 28  --   ALT 29  --   ALKPHOS 80  --   BILITOT 0.6  --   GFRNONAA 58* 48*  GFRAA >60 56*  ANIONGAP 11 6     Hematology Recent Labs  Lab 08/15/18 1503 08/16/18 0341  WBC 6.7 6.5  RBC 4.96 4.75  HGB 11.9* 11.8*  HCT 40.4 38.4  MCV 81.5 80.8  MCH 24.0* 24.8*  MCHC 29.5* 30.7  RDW 18.7* 18.7*  PLT 215 203    Cardiac EnzymesNo results for input(s): TROPONINI in the last 168 hours.  Recent Labs  Lab 08/15/18 1519  TROPIPOC 0.01     BNP Recent Labs  Lab  08/15/18 1503  BNP 55.4     DDimer No results for input(s): DDIMER in the last 168 hours.   Radiology    Dg Chest 2 View  Result Date: 08/15/2018 CLINICAL DATA:  Acute chest pain for 2 days. EXAM: CHEST - 2 VIEW COMPARISON:  02/08/2018 FINDINGS: Mild cardiomegaly and CABG changes again noted. There is no evidence of focal airspace disease, pulmonary edema, suspicious pulmonary nodule/mass, pleural effusion, or pneumothorax. No acute bony abnormalities are identified. IMPRESSION: Mild cardiomegaly without evidence of acute cardiopulmonary disease. Electronically Signed   By: Margarette Canada M.D.   On: 08/15/2018 16:01    Cardiac Studies   CATH Conclusions: 1. Severe native coronary artery disease.  Since the last catheterization in April, the distal LAD is now occluded (supplied by patent SVG).  Otherwise, there has been no significant interval change, though distal LAD disease appears slightly less pronounced on today's angiogram. 2. Patent LIMA-LAD and SVG-RCA. 3. Chronically occluded SVG-D1 and SVG-OM1. 4. Normal left ventricular systolic function  and filling pressure.  Recommendations: 1. Continue medical therapy and aggressive secondary prevention. 2. PCI to CTO of OM1 could be considered (as previously discussed in Dr. Hassell Done cath report n 01/01/2018) if refractory symptoms are noted.  Recommend dual antiplatelet therapy with Aspirin 16m daily and Clopidogrel 730mdaily long-term (beyond 12 months) because of extensive multivessel CAD.   Patient Profile     [age].o. female with history of coronary disease with remote PCI the RCA in 2006, recent and STEMI and cath revealing multivessel coronary disease.  Status post CABG x4 in January 2019, readmitted in April with chest pain and noted occlusion of the graft to OM and diagonal.  Presented again with chest pain in May 2019 and no cardiac catheterization was performed.  Now presents with recurrent chest pain.  Assessment & Plan    CHEST PAIN:   Cath with disease as above.  Reviewed images with Dr. EnSaunders Revel I agree with continued medical management  TOBACCO ABUSE:  Educated.  She is on Zyban and Chantix.  She is trying to quit.   DM:   A1C was 7.8 earlier this month.  Continue current therapy. Follow up per Sh[facility]awnee KnappMD   DYSLIPIDEMIA:   LDL was 70.  Continue high dose statin.  CKD II:   I will hold ACE inhibitor until BMET is repeated.  Repeat a BMET in five days.    HTN;  BP not controlled.  Increased Norvasc yesterday.  Send home on 10 mg. Increased this AM .  Increase metoprolol to 50 mg po bid.    For questions or updates, please contact [facility] consult [email or web address] for contact info under Cardiology/STEMI.   Signed, JaMinus BreedingMD  08/17/2018, 8:07 AM

## 2018-08-17 NOTE — Discharge Instructions (Signed)
PLEASE REMEMBER TO BRING ALL OF YOUR MEDICATIONS TO EACH OF YOUR FOLLOW-UP OFFICE VISITS.  PLEASE ATTEND ALL SCHEDULED FOLLOW-UP APPOINTMENTS.   Activity: Increase activity slowly as tolerated. You may shower, but no soaking baths (or swimming) for 1 week. No driving for 24 hours. No lifting over 5 lbs for 1 week. No sexual activity for 1 week.   You May Return to Work: in 1 week (if applicable)  Wound Care: You may wash cath site gently with soap and water. Keep cath site clean and dry. If you notice pain, swelling, bleeding or pus at your cath site, please call [phone].   Please do not take your lisinopril at discharge. We would like for you to go to the [place] office on Monday 08/23/18 to have your blood work checked to monitor your kidney function. A decision will be made regarding whether it is safe to restart your lisinopril based on those results

## 2018-08-18 ENCOUNTER — Telehealth: Payer: Self-pay | Admitting: Internal Medicine

## 2018-08-18 MED ORDER — NITROGLYCERIN 0.4 MG SL SUBL: 0.4000 mg | SUBLINGUAL_TABLET | SUBLINGUAL | 3 refills | Status: DC | PRN

## 2018-08-18 NOTE — Telephone Encounter (Signed)
rx sent to pharmacy

## 2018-08-18 NOTE — Discharge Summary (Addendum)
Discharge Summary    Patient ID: Victoria Eaton,  MRN: [id number], DOB/AGE: [birth year] 59 y.o.  Admit date: 08/15/2018 Discharge date: 08/17/2018  Primary Care Provider: Shawnee Knapp Primary Cardiologist: Pixie Casino, MD  Discharge Diagnoses    Principal Problem:   Unstable angina [facility]) Active Problems:   Essential hypertension   Tobacco abuse   History of MI (myocardial infarction)   Hyperlipidemia LDL goal <70   Type 2 diabetes mellitus with complication, with long-term current use of insulin (HCC)   Allergies Allergies  Allergen Reactions  . Januvia [Sitagliptin] Nausea And Vomiting and Other (See Comments)    Pancreatitis (this was in Epic, but patient was unaware of this)   . Kiwi Extract Hives  . Shrimp [Shellfish Allergy] Hives  . Metformin And Related Diarrhea  . Penicillins Other (See Comments)    Unknown childhood reaction Has patient had a PCN reaction causing immediate rash, facial/tongue/throat swelling, SOB or lightheadedness with hypotension: Unknown Has patient had a PCN reaction causing severe rash involving mucus membranes or skin necrosis: Unknown Has patient had a PCN reaction that required hospitalization: Unknown Has patient had a PCN reaction occurring within the last 10 years: No If all of the above answers are "NO", then may proceed with Cephalosporin use.     Diagnostic Studies/Procedures    Left heart catheterization 08/16/18: 1. Severe native coronary artery disease.  Since the last catheterization in April, the distal LAD is now occluded (supplied by patent SVG).  Otherwise, there has been no significant interval change, though distal LAD disease appears slightly less pronounced on today's angiogram. 2. Patent LIMA-LAD and SVG-RCA. 3. Chronically occluded SVG-D1 and SVG-OM1. 4. Normal left ventricular systolic function and filling pressure.  Recommendations: 1. Continue medical therapy and aggressive secondary  prevention. 2. PCI to CTO of OM1 could be considered (as previously discussed in Dr. Hassell Done cath report n 01/01/2018) if refractory symptoms are noted.  Recommend dual antiplatelet therapy with Aspirin 48m daily and Clopidogrel 733mdaily long-term (beyond 12 months) because of extensive multivessel CAD.   _____________   History of Present Illness     [age].o. female who presented 08/15/18 with chest pain. She had a remote PCI the RCA in 2006, and recent STEMI and cath revealing multivessel coronary disease.  Status post CABG x4 in January 2019, readmitted in April with chest pain and noted occlusion of the graft to OM and diagonal.  Presented again with chest pain in May 2019.  Troponin was elevated greater than 3, but no cardiac catheterization was performed.  Medical management was recommended.  She was seen in follow-up in the office and continued to complain of chest pain.  Her last office visit was in August 2019 at which time she continued to complain of chest pain despite adjustment of her medical regimen.  She was scheduled to see Dr. HiDebara Pickettn follow-up since he initially saw her the day after she was consulted on by the fellows, but he has not seen her since then and [facility] She now presents with recurrent chest pain.  Initial troponin is negative.  EKG shows sinus rhythm with prior anterior infarct and borderline lateral ST depression.  Her chest pain is nitrate responsive.  Hospital Course     Consultants: None   1. Chest pain in patient with CAD s/p CABG 09/2017: patient presented with chest pain. Multiple admission/ED visits/outpatient visits for similar complaints since CABG  09/2017. Prior to this admission she underwent [facility] 12/2017 which showed occlusion of SVG to OM and diagonal recommended for medical management at that time. This admission troponin was negative x1. EKG non-ischemic. She underwent [facility] 08/16/18 which showed new  distal LAD occlusion (supplied by patent SVG), otherwise no significant change. She was recommended for continue medical management and aggressive seconday prevention. Consideration for PCI to CTO of OM1 to be made if refractory symptoms. Her amlodipine was increased to 17m daily, metoprolol increased to 530mBID, and imdur increased to 12056maily for improved antianginal effects.  - Continue aspirin, plavix, and statin - Continue imdur 120m43mily, ranexa 500mg88m, metoprolol 50mg 55m and amlodipine 10mg d71m  2. HTN: BP poorly controlled this admission. Amlodipine uptitrated to 10mg da73mand metoprolol increased to 50mg BID59mme lisinopril held at discharge given elevated CR.  - Continue amlodipine and metoprolol - Recheck BMET next week to determine if okay to resume home lisinopril.   3. DM type 2: A1C 7.8 07/2018; goal <7 - Continue close outpatient follow-up with Dr. Shaw for Brigitte Pulseing management of DM  4. CKD stage 2: Cr 1.2 on the day of discharge; baseline ~1. Home lisinopril held on discharge  - Recheck BMET next week to determine if okay to resume home lisinopril  5. Tobacco abuse: patient educated on importance of smoking cessation. She is trying to quit - Continue zyban and chantix _____________  Discharge Vitals Blood pressure (!) 164/88, pulse 78, temperature 98.2 F (36.8 C), temperature source Oral, resp. rate 18, height 5' 6.5" (1.689 m), weight 88.1 kg, SpO2 97 %.  Filed Weights   08/15/18 1505 08/16/18 0418 08/17/18 0500  Weight: 89.6 kg 89.4 kg 88.1 kg    Labs & Radiologic Studies    CBC Recent Labs    08/15/18 1503 08/16/18 0341  WBC 6.7 6.5  NEUTROABS 4.3  --   HGB 11.9* 11.8*  HCT 40.4 38.4  MCV 81.5 80.8  PLT 215 203   Bas161Metabolic Panel Recent Labs    08/15/18 1503 08/16/18 0341  NA 134* 138  K 3.5 3.8  CL 102 107  CO2 21* 25  GLUCOSE 217* 199*  BUN 20 21*  CREATININE 1.04* 1.21*  CALCIUM 9.2 9.2   Liver Function Tests Recent  Labs    08/15/18 1503  AST 28  ALT 29  ALKPHOS 80  BILITOT 0.6  PROT 7.9  ALBUMIN 3.7   Recent Labs    08/15/18 1503  LIPASE 24   Cardiac Enzymes No results for input(s): CKTOTAL, CKMB, CKMBINDEX, TROPONINI in the last 72 hours. BNP Invalid input(s): POCBNP D-Dimer No results for input(s): DDIMER in the last 72 hours. Hemoglobin A1C No results for input(s): HGBA1C in the last 72 hours. Fasting Lipid Panel Recent Labs    08/16/18 0341  CHOL 161  HDL 59  LDLCALC 70  TRIG 162*  CHOLHDL 2.7   Thyroid Function Tests No results for input(s): TSH, T4TOTAL, T3FREE, THYROIDAB in the last 72 hours.  Invalid input(s): FREET3 _____________  Dg Chest 2 View  Result Date: 08/15/2018 CLINICAL DATA:  Acute chest pain for 2 days. EXAM: CHEST - 2 VIEW COMPARISON:  02/08/2018 FINDINGS: Mild cardiomegaly and CABG changes again noted. There is no evidence of focal airspace disease, pulmonary edema, suspicious pulmonary nodule/mass, pleural effusion, or pneumothorax. No acute bony abnormalities are identified. IMPRESSION: Mild cardiomegaly without evidence of acute cardiopulmonary disease. Electronically Signed   By: Jeffrey  Margarette Canadan:  08/15/2018 16:01   Disposition   Patient was seen and examined by Dr. Percival Spanish who deemed patient as stable for discharge. Follow-up has been arranged. Discharge medications as listed below.   Follow-up Plans & Appointments    Follow-up Information    [facility], [place] Follow up on 08/27/2018.   Specialties:  Cardiology, Radiology Why:  Please arrive 15 minutes early for your 10:00am post-hospital cardiology follow-up appointment Contact information: [address] [place] [address] [phone]        [facility] Follow up on 08/23/2018.   Specialty:  Cardiology Why:  Please present anytime between 8am and 4pm on 08/23/18 to have your blood work checked to monitor your kidney function.  Contact information: [address] [place] [phone]         Discharge Instructions    Diet - low sodium heart healthy   Complete by:  As directed    Increase activity slowly   Complete by:  As directed       Discharge Medications   Allergies as of 08/17/2018      Reactions   Januvia [sitagliptin] Nausea And Vomiting, Other (See Comments)   Pancreatitis (this was in Epic, but patient was unaware of this)   Kiwi Extract Hives   Shrimp [shellfish Allergy] Hives   Metformin And Related Diarrhea   Penicillins Other (See Comments)   Unknown childhood reaction Has patient had a PCN reaction causing immediate rash, facial/tongue/throat swelling, SOB or lightheadedness with hypotension: Unknown Has patient had a PCN reaction causing severe rash involving mucus membranes or skin necrosis: Unknown Has patient had a PCN reaction that required hospitalization: Unknown Has patient had a PCN reaction occurring within the last 10 years: No If all of the above answers are "NO", then may proceed with Cephalosporin use.      Medication List    STOP taking these medications   lisinopril 5 MG tablet Commonly known as:  PRINIVIL,ZESTRIL     TAKE these medications   acetaminophen 500 MG tablet Commonly known as:  TYLENOL Take 1,000 mg by mouth every 6 (six) hours as needed for headache (pain).   amLODipine 10 MG tablet Commonly known as:  NORVASC Take 1 tablet (10 mg total) by mouth daily. Start taking on:  08/18/2018 What changed:    medication strength  how much to take   ARTIFICIAL TEARS 1.4 % ophthalmic solution Generic drug:  polyvinyl alcohol Place 1 drop into both eyes 3 (three) times daily as needed for dry eyes.   aspirin 81 MG EC tablet Take 1 tablet (81 mg total) by mouth daily. What changed:  when to take this   atorvastatin 80 MG tablet Commonly known as:  LIPITOR Take 1 tablet (80 mg total) by mouth daily at 6 PM. **NEED OFFICE VISIT WITH  FASTING LABS FOR REFILLS** What changed:  when to take this   buPROPion 150 MG 24 hr tablet Commonly known as:  WELLBUTRIN XL Take 1 tablet (150 mg total) by mouth daily.   canagliflozin 300 MG Tabs tablet Commonly known as:  INVOKANA Take 1 tablet (300 mg total) by mouth daily before breakfast.   clopidogrel 75 MG tablet Commonly known as:  PLAVIX Take 1 tablet (75 mg total) by mouth daily.   diphenhydramine-acetaminophen 25-500 MG Tabs tablet Commonly known as:  TYLENOL PM Take 2 tablets by mouth at bedtime as needed (sleep).   EPINEPHrine 0.3 mg/0.3 mL Soaj injection Commonly  known as:  EPI-PEN Inject 0.3 mLs (0.3 mg total) into the muscle once as needed. What changed:  reasons to take this   fluticasone 50 MCG/ACT nasal spray Commonly known as:  FLONASE Place 1 spray into both nostrils daily as needed for allergies or rhinitis. What changed:  when to take this   FREESTYLE LIBRE [place] 2 Devices by Does not apply route every morning.   Insulin Glargine 100 UNIT/ML Solostar Pen Commonly known as:  LANTUS Inject 35 Units into the skin daily at 10 pm. What changed:  when to take this   isosorbide mononitrate 120 MG 24 hr tablet Commonly known as:  IMDUR Take 1 tablet (120 mg total) by mouth daily. Start taking on:  08/18/2018 What changed:    medication strength  how much to take   loratadine 10 MG tablet Commonly known as:  CLARITIN Take 1 tablet (10 mg total) by mouth daily.   metoprolol tartrate 50 MG tablet Commonly known as:  LOPRESSOR Take 1 tablet (50 mg total) by mouth 2 (two) times daily. What changed:    medication strength  how much to take   multivitamin with minerals Tabs tablet Take 1 tablet by mouth daily.   nitroGLYCERIN 0.4 MG SL tablet Commonly known as:  NITROSTAT Place 1 tablet (0.4 mg total) under the tongue every 5 (five) minutes as needed for chest pain.   NOVOLOG FLEXPEN 100 UNIT/ML FlexPen Generic drug:  insulin  aspart INJECT 10 UNITS UNDER SKIN PRIOR TO MEALS AND INJECT 5 UNITS UNDER SKIN FOR CBG >250 What changed:  See the new instructions.   Pen Needles 32G X 4 MM Misc 1 Units by Does not apply route daily.   ranolazine 500 MG 12 hr tablet Commonly known as:  RANEXA Take 1 tablet (500 mg total) by mouth 2 (two) times daily.   sertraline 50 MG tablet Commonly known as:  ZOLOFT TAKE ONE TABLET BY MOUTH ONE TIME DAILY   TURMERIC PO Take 1 capsule by mouth daily.   varenicline 0.5 MG X 11 & 1 MG X 42 tablet Commonly known as:  CHANTIX PAK Take one 0.5 mg tab po qd x 3d, then increase to one 0.5 mg tab bid x 4 d, then increase to one 1 mg tab bid What changed:  Another medication with the same name was changed. Make sure you understand how and when to take each.   CHANTIX 0.5 MG tablet Generic drug:  varenicline TAKE ONE TABLET BY MOUTH TWICE A DAY What changed:  how much to take   zolpidem 10 MG tablet Commonly known as:  AMBIEN Take 1 tablet (10 mg total) by mouth at bedtime as needed for sleep. **NEED OFFICE VISIT FOR ANY ADDITIONAL REFILLS**        Aspirin prescribed at discharge?  Yes High Intensity Statin Prescribed? (Lipitor 40-67m or Crestor 20-447m: Yes Beta Blocker Prescribed? Yes For EF <40%, was ACEI/ARB Prescribed? No: Cr above baseline ADP Receptor Inhibitor Prescribed? (i.e. Plavix etc.-Includes Medically Managed Patients): Yes For EF <40%, Aldosterone Inhibitor Prescribed? No: EF >40% Was EF assessed during THIS hospitalization? No Was Cardiac Rehab II ordered? (Included Medically managed Patients): No   Outstanding Labs/Studies   BMET 12/2  Duration of Discharge Encounter   Greater than 30 minutes including physician time.  Signed, KrAbigail ButtsA-C 08/17/2018, 10:11 AM

## 2018-08-18 NOTE — Telephone Encounter (Signed)
New Message           *STAT* If patient is at the pharmacy, call can be transferred to refill team.   1. Which medications need to be refilled? (please list name of each medication and dose if known) Nitroglycerin   2. Which pharmacy/location (including street and city if local pharmacy) is medication to be sent to?[facility] [place]   3. Do they need a 30 day or 90 day supply? [place]

## 2018-08-18 NOTE — Telephone Encounter (Signed)
Lm to call back ./cy 

## 2018-08-20 ENCOUNTER — Other Ambulatory Visit: Payer: Self-pay

## 2018-08-20 MED ORDER — CANAGLIFLOZIN 300 MG PO TABS: 300.0000 mg | ORAL_TABLET | Freq: Every day | ORAL | 0 refills | Status: DC

## 2018-08-20 NOTE — Telephone Encounter (Signed)
Refill req Invokana Filled x 30 with -0- refills  Appt 09/03/2018 wSantiago

## 2018-08-23 ENCOUNTER — Other Ambulatory Visit: Payer: Self-pay

## 2018-08-23 ENCOUNTER — Telehealth: Payer: Self-pay | Admitting: Family Medicine

## 2018-08-23 DIAGNOSIS — I10 Essential (primary) hypertension: Secondary | ICD-10-CM | POA: Diagnosis not present

## 2018-08-23 NOTE — Telephone Encounter (Signed)
Copied from [place] [id number]. Topic: Quick Communication - See Telephone Encounter >> Aug 23, 2018  2:13 PM Berneta Levins wrote: CRM for notification. See Telephone encounter for: 08/23/18.  Tammy with [employer] calling.  Pt was in hospital (11/25 and discharged yesterday) with chest pain.  [employer] is calling to reach out with care management if Dr. Brigitte Pulse needs anything. Tammy can be reached at [phone]

## 2018-08-23 NOTE — Telephone Encounter (Signed)
Called patient regarding appointment details. LMTCB, left call back number.

## 2018-08-24 LAB — BASIC METABOLIC PANEL
BUN/Creatinine Ratio: 20 (ref 9–23)
BUN: 24 mg/dL (ref 6–24)
CALCIUM: 9.4 mg/dL (ref 8.7–10.2)
CHLORIDE: 99 mmol/L (ref 96–106)
CO2: 22 mmol/L (ref 20–29)
Creatinine, Ser: 1.2 mg/dL — ABNORMAL HIGH (ref 0.57–1.00)
GFR calc non Af Amer: 50 mL/min/{1.73_m2} — ABNORMAL LOW (ref >59)
GFR, EST AFRICAN AMERICAN: 57 mL/min/{1.73_m2} — AB (ref >59)
GLUCOSE: 212 mg/dL — AB (ref 65–99)
Potassium: 4.7 mmol/L (ref 3.5–5.2)
Sodium: 137 mmol/L (ref 134–144)

## 2018-08-24 NOTE — Telephone Encounter (Signed)
Left message for pt to call.

## 2018-08-27 ENCOUNTER — Ambulatory Visit: Payer: BLUE CROSS/BLUE SHIELD | Admitting: Physician Assistant

## 2018-08-27 ENCOUNTER — Telehealth: Payer: Self-pay | Admitting: Cardiology

## 2018-08-27 MED ORDER — RANOLAZINE ER 500 MG PO TB12: 500.0000 mg | ORAL_TABLET | Freq: Two times a day (BID) | ORAL | 2 refills | Status: DC

## 2018-08-27 NOTE — Telephone Encounter (Signed)
New Message    *STAT* If patient is at the pharmacy, call can be transferred to refill team.   1. Which medications need to be refilled? (please list name of each medication and dose if known) ranolazine (RANEXA) 500 MG 12 hr tablet  Take 1 tablet (500 mg total) by mouth 2 (two) times daily.  2. Which pharmacy/location (including street and city if local pharmacy) is medication to be sent to? [facility] [address] [place], [place].  3. Do they need a 30 day or 90 day supply? [place]

## 2018-08-31 ENCOUNTER — Telehealth: Payer: Self-pay | Admitting: Family Medicine

## 2018-08-31 NOTE — Telephone Encounter (Signed)
Pt needs to sched an hosp f/u visit with me asap.  Looks like she has a CPE sched w/ [place] later on this week but may needs different hosp f/u visit to address her chronic medical conditions.  I do have an opening at 10:30 today if she could make it then. [name] [name]

## 2018-08-31 NOTE — Telephone Encounter (Signed)
LVM for pt to call and schedule a hospital F/U with  DIFFERENT provider other than Brigitte Pulse. Shaw's first hospital F/U will be in January. Dr. Mitchel Honour has one on this Thursday 09/02/18 or Nolon Rod has one on the 10/07/17 or yo may search for any others. Please make a appt asap at her convenience. Thank you!

## 2018-09-01 NOTE — Telephone Encounter (Signed)
Pt is travelling and has an appt with dr Pamella Pert on 09-03-18 for a physical. Pt is aware they normally do not do physical and hosp fup in same visit. Pt will callback to sch hospital fup

## 2018-09-02 ENCOUNTER — Telehealth: Payer: Self-pay | Admitting: Family Medicine

## 2018-09-02 NOTE — Telephone Encounter (Signed)
Copied from [place] [phone]. Topic: General - Other >> Sep 02, 2018 10:12 AM Yvette Rack wrote: Reason for CRM: [place]/Pharmacist with [facility] states they have a Rx for sertraline (ZOLOFT) 50 MG tablet and requests approval to fill as a 90 day supply.

## 2018-09-03 ENCOUNTER — Encounter: Payer: BLUE CROSS/BLUE SHIELD | Admitting: Family Medicine

## 2018-09-04 NOTE — Telephone Encounter (Signed)
Called pharmacy patient needs an appointment for 90 day supply

## 2018-09-06 ENCOUNTER — Ambulatory Visit (INDEPENDENT_AMBULATORY_CARE_PROVIDER_SITE_OTHER): Payer: BLUE CROSS/BLUE SHIELD | Admitting: Internal Medicine

## 2018-09-06 ENCOUNTER — Encounter: Payer: Self-pay | Admitting: Internal Medicine

## 2018-09-06 VITALS — BP 95/53 | HR 58 | Ht 66.0 in | Wt 198.8 lb

## 2018-09-06 DIAGNOSIS — I10 Essential (primary) hypertension: Secondary | ICD-10-CM | POA: Diagnosis not present

## 2018-09-06 DIAGNOSIS — Z951 Presence of aortocoronary bypass graft: Secondary | ICD-10-CM

## 2018-09-06 DIAGNOSIS — I25708 Atherosclerosis of coronary artery bypass graft(s), unspecified, with other forms of angina pectoris: Secondary | ICD-10-CM

## 2018-09-06 DIAGNOSIS — Z794 Long term (current) use of insulin: Secondary | ICD-10-CM

## 2018-09-06 DIAGNOSIS — E118 Type 2 diabetes mellitus with unspecified complications: Secondary | ICD-10-CM

## 2018-09-06 MED ORDER — AMLODIPINE BESYLATE 5 MG PO TABS: 5.0000 mg | ORAL_TABLET | Freq: Every day | ORAL | 3 refills | Status: DC

## 2018-09-06 NOTE — Progress Notes (Signed)
OFFICE NOTE  Chief Complaint:  Follow-up  Primary Care Physician: Shawnee Knapp, MD  HPI:  Victoria Eaton is a 57 y.o. female with a past medial history significant for remote PCI the RCA in 2006, recent and STEMI and cath revealing multivessel coronary disease.  Status post CABG x4 in January 2019, readmitted in April with chest pain and noted occlusion of the graft to OM and diagonal.  Presented again with chest pain in May 2019.  Troponin was elevated greater than 3, but no cardiac catheterization was performed.  Medical management was recommended.  She was seen in follow-up in the office and continued to complain of chest pain.  Her last office visit was in August 2019 at which time she continued to complain of chest pain despite adjustment of her medical regimen.  She was scheduled to see me in follow-up since I initially saw her the day after she was consulted on by the fellows, but I have not seen her since then and she has been seen by multiple partners in my group.  Ultimately underwent repeat cardiac catheterization in November 2019 which showed distally occluded LAD but patent LIMA to LAD and SVG to RCA.  The SVG to D1 and SVG to OM1 are chronically occluded.  There are no significant changes from the prior study.  Medical therapy was recommended.  Subsequently her blood pressure medications were increased including her amlodipine up to 10 mg and metoprolol up to 50 mg twice daily.  Since then she is done well without any recurrent chest pain.  Of note though she has had some recent dizziness.  Her blood pressure today was low at 95/53.  She does not follow it routinely.    PMHx:  Past Medical History:  Diagnosis Date  . Anxiety   . Arthritis    "maybe in my left foot" (10/14/2017)  . Bladder prolapse, female, acquired 09/14/2017  . Depression   . High cholesterol   . History of stomach ulcers   . Hypertension   . Myocardial infarction ([place]) 12/30/2004  . Tobacco abuse   . Type II  diabetes mellitus ([place])     Past Surgical History:  Procedure Laterality Date  . CARDIAC CATHETERIZATION  10/14/2017  . CORONARY ANGIOPLASTY WITH STENT PLACEMENT  12/30/2004   Patient reported  . CORONARY ARTERY BYPASS GRAFT N/A 10/19/2017   Procedure: CORONARY ARTERY BYPASS GRAFTING (CABG) times four using the right saphaneous vein. Harvested endoscopicly and left internal mammary artery.;  Surgeon: Grace Isaac, MD;  Location: [facility];  Service: Open Heart Surgery;  Laterality: N/A;  . DILATION AND CURETTAGE OF UTERUS  1980s  . LEFT HEART CATH AND CORONARY ANGIOGRAPHY N/A 10/14/2017   Procedure: LEFT HEART CATH AND CORONARY ANGIOGRAPHY;  Surgeon: Martinique, Peter M, MD;  Location: [facility];  Service: Cardiovascular;  Laterality: N/A;  . LEFT HEART CATH AND CORS/GRAFTS ANGIOGRAPHY N/A 01/01/2018   Procedure: LEFT HEART CATH AND CORS/GRAFTS ANGIOGRAPHY;  Surgeon: Jettie Booze, MD;  Location: [facility];  Service: Cardiovascular;  Laterality: N/A;  . LEFT HEART CATH AND CORS/GRAFTS ANGIOGRAPHY N/A 08/16/2018   Procedure: LEFT HEART CATH AND CORS/GRAFTS ANGIOGRAPHY;  Surgeon: Nelva Bush, MD;  Location: [facility];  Service: Cardiovascular;  Laterality: N/A;  . PILONIDAL CYST EXCISION  1980s  . TEE WITHOUT CARDIOVERSION N/A 10/19/2017   Procedure: TRANSESOPHAGEAL ECHOCARDIOGRAM (TEE);  Surgeon: Grace Isaac, MD;  Location: [facility];  Service: Open Heart Surgery;  Laterality: N/A;    FAMHx:  Family History  Problem Relation Age of Onset  . Heart disease Father   . Hyperlipidemia Father   . Hypertension Father     SOCHx:   reports that she has been smoking cigarettes. She started smoking about 46 years ago. She has a 5.40 pack-year smoking history. She has never used smokeless tobacco. She reports current alcohol use of about 14.0 standard drinks of alcohol per week. She reports that she does not use drugs.  ALLERGIES:  Allergies  Allergen  Reactions  . Januvia [Sitagliptin] Nausea And Vomiting and Other (See Comments)    Pancreatitis (this was in Epic, but patient was unaware of this)   . Kiwi Extract Hives  . Shrimp [Shellfish Allergy] Hives  . Metformin And Related Diarrhea  . Penicillins Other (See Comments)    Unknown childhood reaction Has patient had a PCN reaction causing immediate rash, facial/tongue/throat swelling, SOB or lightheadedness with hypotension: Unknown Has patient had a PCN reaction causing severe rash involving mucus membranes or skin necrosis: Unknown Has patient had a PCN reaction that required hospitalization: Unknown Has patient had a PCN reaction occurring within the last 10 years: No If all of the above answers are "NO", then may proceed with Cephalosporin use.     ROS: Pertinent items noted in HPI and remainder of comprehensive ROS otherwise negative.  HOME MEDS: Current Outpatient Medications on File Prior to Visit  Medication Sig Dispense Refill  . acetaminophen (TYLENOL) 500 MG tablet Take 1,000 mg by mouth every 6 (six) hours as needed for headache (pain).    [name] aspirin 81 MG EC tablet Take 1 tablet (81 mg total) by mouth daily. (Patient taking differently: Take 81 mg by mouth at bedtime. ) 90 tablet 3  . atorvastatin (LIPITOR) 80 MG tablet Take 1 tablet (80 mg total) by mouth daily at 6 PM. **NEED OFFICE VISIT WITH FASTING LABS FOR REFILLS** (Patient taking differently: Take 80 mg by mouth at bedtime. **NEED OFFICE VISIT WITH FASTING LABS FOR REFILLS**) 90 tablet 1  . buPROPion (WELLBUTRIN XL) 150 MG 24 hr tablet Take 1 tablet (150 mg total) by mouth daily. 90 tablet 0  . canagliflozin (INVOKANA) 300 MG TABS tablet Take 1 tablet (300 mg total) by mouth daily before breakfast. 30 tablet 0  . CHANTIX 0.5 MG tablet TAKE ONE TABLET BY MOUTH TWICE A DAY (Patient taking differently: Take 0.5 mg by mouth 2 (two) times daily. ) 56 tablet 5  . clopidogrel (PLAVIX) 75 MG tablet Take 1 tablet (75 mg  total) by mouth daily. 90 tablet 3  . Continuous Blood Gluc Sensor (FREESTYLE LIBRE 14 DAY SENSOR) MISC 2 Devices by Does not apply route every morning. 2 each 5  . diphenhydramine-acetaminophen (TYLENOL PM) 25-500 MG TABS tablet Take 2 tablets by mouth at bedtime as needed (sleep).    . EPINEPHrine (EPIPEN 2-PAK) 0.3 mg/0.3 mL IJ SOAJ injection Inject 0.3 mLs (0.3 mg total) into the muscle once as needed. (Patient taking differently: Inject 0.3 mg into the muscle once as needed (severe allergic reaction). ) 1 Device 1  . fluticasone (FLONASE) 50 MCG/ACT nasal spray Place 1 spray into both nostrils daily as needed for allergies or rhinitis. (Patient taking differently: Place 1 spray into both nostrils at bedtime. ) 16 g 3  . Insulin Glargine (LANTUS SOLOSTAR) 100 UNIT/ML Solostar Pen Inject 35 Units into the skin daily at 10 pm. (Patient taking differently: Inject 35 Units into the skin at  bedtime. ) 30 pen 1  . Insulin Pen Needle (PEN NEEDLES) 32G X 4 MM MISC 1 Units by Does not apply route daily. 400 each 11  . isosorbide mononitrate (IMDUR) 120 MG 24 hr tablet Take 1 tablet (120 mg total) by mouth daily. 30 tablet 3  . loratadine (CLARITIN) 10 MG tablet Take 1 tablet (10 mg total) by mouth daily. 90 tablet 3  . metoprolol tartrate (LOPRESSOR) 50 MG tablet Take 1 tablet (50 mg total) by mouth 2 (two) times daily. 60 tablet 3  . Multiple Vitamin (MULTIVITAMIN WITH MINERALS) TABS tablet Take 1 tablet by mouth daily.    . nitroGLYCERIN (NITROSTAT) 0.4 MG SL tablet Place 1 tablet (0.4 mg total) under the tongue every 5 (five) minutes as needed for chest pain. 25 tablet 3  . NOVOLOG FLEXPEN 100 UNIT/ML FlexPen INJECT 10 UNITS UNDER SKIN PRIOR TO MEALS AND INJECT 5 UNITS UNDER SKIN FOR CBG >250 (Patient taking differently: Inject 5-10 Units into the skin See admin instructions. Inject 10 units subcutaneously three times daily before meals, may also inject 5 units up to twice daily as needed for CBG >250) 30  mL 1  . polyvinyl alcohol (ARTIFICIAL TEARS) 1.4 % ophthalmic solution Place 1 drop into both eyes 3 (three) times daily as needed for dry eyes.    . ranolazine (RANEXA) 500 MG 12 hr tablet Take 1 tablet (500 mg total) by mouth 2 (two) times daily. 90 tablet 2  . sertraline (ZOLOFT) 50 MG tablet TAKE ONE TABLET BY MOUTH ONE TIME DAILY (Patient taking differently: Take 50 mg by mouth daily. ) 30 tablet 3  . TURMERIC PO Take 1 capsule by mouth daily.     . varenicline (CHANTIX STARTING MONTH PAK) 0.5 MG X 11 & 1 MG X 42 tablet Take one 0.5 mg tab po qd x 3d, then increase to one 0.5 mg tab bid x 4 d, then increase to one 1 mg tab bid 53 tablet 0  . zolpidem (AMBIEN) 10 MG tablet Take 1 tablet (10 mg total) by mouth at bedtime as needed for sleep. **NEED OFFICE VISIT FOR ANY ADDITIONAL REFILLS** 30 tablet 0   No current facility-administered medications on file prior to visit.     LABS/IMAGING: No results found for this or any previous visit (from the past 48 hour(s)). No results found.  LIPID PANEL:    Component Value Date/Time   CHOL 161 08/16/2018 0341   CHOL 163 08/03/2018 1553   TRIG 162 (H) 08/16/2018 0341   HDL 59 08/16/2018 0341   HDL 67 08/03/2018 1553   CHOLHDL 2.7 08/16/2018 0341   VLDL 32 08/16/2018 0341   LDLCALC 70 08/16/2018 0341   LDLCALC 66 08/03/2018 1553     WEIGHTS: Wt Readings from Last 3 Encounters:  09/06/18 198 lb 12.8 oz (90.2 kg)  08/17/18 194 lb 4.8 oz (88.1 kg)  08/03/18 197 lb 9.6 oz (89.6 kg)    VITALS: BP (!) 95/53   Pulse (!) 58   Ht 5' 6"  (1.676 m)   Wt 198 lb 12.8 oz (90.2 kg)   BMI 32.09 kg/m   EXAM: General appearance: alert and no distress Neck: no carotid bruit, no JVD and thyroid not enlarged, symmetric, no tenderness/mass/nodules Lungs: clear to auscultation bilaterally Heart: regular rate and rhythm Abdomen: soft, non-tender; bowel sounds normal; no masses,  no organomegaly Extremities: extremities normal, atraumatic, no cyanosis  or edema Pulses: 2+ and symmetric Skin: Skin color, texture, turgor normal. No  rashes or lesions Neurologic: Grossly normal Psych: Pleasant  EKG: Sinus bradycardia 58- personally reviewed  ASSESSMENT: 1. CAD status post CABG x 4 in 09/2017 (LIMA to LAD (patent), SVG to RCA (patent), SVG-D1 (CTO) and SVG-OM1 (CTO) as of cath 07/2018 2. HTN 3. HLD 4. DM2  PLAN: 1.   Victoria Eaton has been chest pain-free since her last cath November 2019.  Her medications have been increased however blood pressure is low today.  She is somewhat dizzy with this.  We will plan to decrease her amlodipine back to 5 mg daily but keep her on increased dose of metoprolol.  She is ready on anti-anginal therapy including ranolazine.  No other changes to her medicines today.  Plan follow-up with me in 6 months.  Pixie Casino, MD, [facility] [place] Director of the Advanced Lipid Disorders &  Cardiovascular Risk Reduction Clinic Diplomate of the American Board of Clinical Lipidology Attending Cardiologist  Direct Dial: [phone]  Fax: [phone]  Website:  [email or web address] 09/06/2018, 12:46 PM

## 2018-09-06 NOTE — Patient Instructions (Signed)
Medication Instructions:  DECREASE amlodipine to 64m daily If you need a refill on your cardiac medications before your next appointment, please call your pharmacy.   Follow-Up: [facility] you and your health needs are our priority.  As part of our continuing mission to provide you with exceptional heart care, we have created designated Provider Care Teams.  These Care Teams include your primary Cardiologist (physician) and Advanced Practice Providers (APPs -  Physician Assistants and Nurse Practitioners) who all work together to provide you with the care you need, when you need it. You will need a follow up appointment in 6 months.  Please call our office 2 months in advance to schedule this appointment.  You may see KPixie Casino MD or one of the following Advanced Practice Providers on your designated Care Team: [place]. AFabian Sharp PA-C  Any Other Special Instructions Will Be Listed Below (If Applicable).

## 2018-09-09 ENCOUNTER — Encounter: Payer: Self-pay | Admitting: Allergy

## 2018-09-09 ENCOUNTER — Ambulatory Visit (INDEPENDENT_AMBULATORY_CARE_PROVIDER_SITE_OTHER): Payer: BLUE CROSS/BLUE SHIELD | Admitting: Allergy

## 2018-09-09 VITALS — BP 110/70 | HR 70 | Resp 18

## 2018-09-09 DIAGNOSIS — J3089 Other allergic rhinitis: Secondary | ICD-10-CM

## 2018-09-09 DIAGNOSIS — J302 Other seasonal allergic rhinitis: Secondary | ICD-10-CM

## 2018-09-09 DIAGNOSIS — T7800XD Anaphylactic reaction due to unspecified food, subsequent encounter: Secondary | ICD-10-CM

## 2018-09-09 NOTE — Patient Instructions (Addendum)
1. Anaphylactic shock due to food (kiwi) - Testing was negative to all of the shellfish and very low IgE to shrimp - shrimp challenge performed today in the office today was successfully passed.  She can now incorporate shrimp into the diet.  I have asked that at a minimum she have shellfish 3-4 times a month to maintain a tolerant state - continue avoidance of kiwi - continue to have access to you epinephrine device at all times  2. Seasonal and perennial allergic rhinitis - Testing showed: rabbits, ragweed, weeds, grasses and dust mites - Continue with: Claritin (loratadine) 45m tablet once daily; Flonase (fluticasone) one spray per nostril daily as needed on the worse days.  - You can use an extra dose of the antihistamine, if needed, for breakthrough symptoms.  - Consider nasal saline rinses 1-2 times daily to remove allergens from the nasal cavities as well as help with mucous clearance (this is especially helpful to do before the nasal sprays are given) - Consider allergy shots as a means of long-term control. - Allergy shots "re-train" and "reset" the immune system to ignore environmental allergens and decrease the resulting immune response to those allergens (sneezing, itchy watery eyes, runny nose, nasal congestion, etc).    - Allergy shots improve symptoms in 75-85% of patients.  - Dr. GErnst Bowlercan discuss more at the next appointment if the medications are not working for you.  3. Return in about 4-6 months or sooner if needed

## 2018-09-09 NOTE — Progress Notes (Signed)
Follow-up Note  RE: Victoria Eaton MRN: [id number] DOB: Jun 21, 1961 Date of Office Visit: 09/09/2018   History of present illness: Victoria Eaton is a 57 y.o. female presenting today for food challenge to shrimp.  She was last seen in the office on August 03, 2018 by Victoria Eaton.  Skin prick testing to shellfish mix and shrimp, crab, lobster, oyster and scallops were negative.  Serum IgE done after the last visit to shrimp was 0.49 kU/L.  Decision was made to have her perform a shrimp challenge.  From Victoria Eaton note from 08/03/2018  - food Allergy Symptom History: She reports that she was eating steamed shrimp three years ago. It started with hand itching when she ate raw shrimp. She was still able to eat it initially. This went on for a few years before she reacted to it systemically. She describes itching over her entire body. Then she also had a reaction the kiwi despite years of reacting to the raw fruit. She did not have swelling with the kiwi, but she did have trouble with swelling with the shrimp. She did not have breathing difficulties and took Benadryl with improvement in her symptoms. Since then, she has avoided all shellfish. But she does eat fin fish as well as calamari without problems. She is careful about cross contamination. But she would like to eat shrimp once again.   Review of systems: Review of Systems  Constitutional: Negative for chills, fever and malaise/fatigue.  HENT: Negative for congestion, ear discharge, nosebleeds and sore throat.   Eyes: Negative for pain, discharge and redness.  Respiratory: Negative for cough, shortness of breath and wheezing.   Cardiovascular: Negative for chest pain.  Gastrointestinal: Negative for abdominal pain, constipation, diarrhea, heartburn, nausea and vomiting.  Musculoskeletal: Negative for joint pain.  Skin: Negative for itching and rash.  Neurological: Negative for headaches.    All other systems negative unless noted  above in HPI  Past medical/social/surgical/family history have been reviewed and are unchanged unless specifically indicated below.  No changes  Medication List: Allergies as of 09/09/2018      Reactions   Januvia [sitagliptin] Nausea And Vomiting, Other (See Comments)   Pancreatitis (this was in Epic, but patient was unaware of this)   Kiwi Extract Hives   Shrimp [shellfish Allergy] Hives   Metformin And Related Diarrhea   Penicillins Other (See Comments)   Unknown childhood reaction Has patient had a PCN reaction causing immediate rash, facial/tongue/throat swelling, SOB or lightheadedness with hypotension: Unknown Has patient had a PCN reaction causing severe rash involving mucus membranes or skin necrosis: Unknown Has patient had a PCN reaction that required hospitalization: Unknown Has patient had a PCN reaction occurring within the last 10 years: No If all of the above answers are "NO", then may proceed with Cephalosporin use.      Medication List       Accurate as of September 09, 2018 11:41 AM. Always use your most recent med list.        acetaminophen 500 MG tablet Commonly known as:  TYLENOL Take 1,000 mg by mouth every 6 (six) hours as needed for headache (pain).   amLODipine 5 MG tablet Commonly known as:  NORVASC Take 1 tablet (5 mg total) by mouth daily.   ARTIFICIAL TEARS 1.4 % ophthalmic solution Generic drug:  polyvinyl alcohol Place 1 drop into both eyes 3 (three) times daily as needed for dry eyes.   aspirin 81 MG EC tablet Take 1 tablet (81  mg total) by mouth daily.   atorvastatin 80 MG tablet Commonly known as:  LIPITOR Take 1 tablet (80 mg total) by mouth daily at 6 PM. **NEED OFFICE VISIT WITH FASTING LABS FOR REFILLS**   buPROPion 150 MG 24 hr tablet Commonly known as:  WELLBUTRIN XL Take 1 tablet (150 mg total) by mouth daily.   canagliflozin 300 MG Tabs tablet Commonly known as:  INVOKANA Take 1 tablet (300 mg total) by mouth daily  before breakfast.   clopidogrel 75 MG tablet Commonly known as:  PLAVIX Take 1 tablet (75 mg total) by mouth daily.   diphenhydramine-acetaminophen 25-500 MG Tabs tablet Commonly known as:  TYLENOL PM Take 2 tablets by mouth at bedtime as needed (sleep).   EPINEPHrine 0.3 mg/0.3 mL Soaj injection Commonly known as:  EPIPEN 2-PAK Inject 0.3 mLs (0.3 mg total) into the muscle once as needed.   fluticasone 50 MCG/ACT nasal spray Commonly known as:  FLONASE Place 1 spray into both nostrils daily as needed for allergies or rhinitis.   FREESTYLE LIBRE 14 DAY SENSOR Misc 2 Devices by Does not apply route every morning.   Insulin Glargine 100 UNIT/ML Solostar Pen Commonly known as:  LANTUS SOLOSTAR Inject 35 Units into the skin daily at 10 pm.   isosorbide mononitrate 120 MG 24 hr tablet Commonly known as:  IMDUR Take 1 tablet (120 mg total) by mouth daily.   loratadine 10 MG tablet Commonly known as:  CLARITIN Take 1 tablet (10 mg total) by mouth daily.   metoprolol tartrate 50 MG tablet Commonly known as:  LOPRESSOR Take 1 tablet (50 mg total) by mouth 2 (two) times daily.   multivitamin with minerals Tabs tablet Take 1 tablet by mouth daily.   nitroGLYCERIN 0.4 MG SL tablet Commonly known as:  NITROSTAT Place 1 tablet (0.4 mg total) under the tongue every 5 (five) minutes as needed for chest pain.   NOVOLOG FLEXPEN 100 UNIT/ML FlexPen Generic drug:  insulin aspart INJECT 10 UNITS UNDER SKIN PRIOR TO MEALS AND INJECT 5 UNITS UNDER SKIN FOR CBG >250   Pen Needles 32G X 4 MM Misc 1 Units by Does not apply route daily.   ranolazine 500 MG 12 hr tablet Commonly known as:  RANEXA Take 1 tablet (500 mg total) by mouth 2 (two) times daily.   sertraline 50 MG tablet Commonly known as:  ZOLOFT TAKE ONE TABLET BY MOUTH ONE TIME DAILY   TURMERIC PO Take 1 capsule by mouth daily.   varenicline 0.5 MG X 11 & 1 MG X 42 tablet Commonly known as:  CHANTIX STARTING MONTH  PAK Take one 0.5 mg tab po qd x 3d, then increase to one 0.5 mg tab bid x 4 d, then increase to one 1 mg tab bid   CHANTIX 0.5 MG tablet Generic drug:  varenicline TAKE ONE TABLET BY MOUTH TWICE A DAY   zolpidem 10 MG tablet Commonly known as:  AMBIEN Take 1 tablet (10 mg total) by mouth at bedtime as needed for sleep. **NEED OFFICE VISIT FOR ANY ADDITIONAL REFILLS**       Known medication allergies: Allergies  Allergen Reactions  . Januvia [Sitagliptin] Nausea And Vomiting and Other (See Comments)    Pancreatitis (this was in Epic, but patient was unaware of this)   . Kiwi Extract Hives  . Shrimp [Shellfish Allergy] Hives  . Metformin And Related Diarrhea  . Penicillins Other (See Comments)    Unknown childhood reaction Has patient had a  PCN reaction causing immediate rash, facial/tongue/throat swelling, SOB or lightheadedness with hypotension: Unknown Has patient had a PCN reaction causing severe rash involving mucus membranes or skin necrosis: Unknown Has patient had a PCN reaction that required hospitalization: Unknown Has patient had a PCN reaction occurring within the last 10 years: No If all of the above answers are "NO", then may proceed with Cephalosporin use.      Physical examination: Blood pressure 110/70, pulse 70, resp. rate 18, SpO2 97 %.  General: Alert, interactive, in no acute distress. HEENT: PERRLA, TMs pearly gray, turbinates minimally edematous without discharge, post-pharynx non erythematous. Neck: Supple without lymphadenopathy. Lungs: Clear to auscultation without wheezing, rhonchi or rales. {no increased work of breathing. CV: Normal S1, S2 without murmurs. Abdomen: Nondistended, nontender. Skin: cherry red spot on abdomen; healed sternal incision; freestyle glucose sensor on post right arm. Extremities:  No clubbing, cyanosis or edema. Neuro:   Grossly intact.  Diagnositics/Labs: Labs: see HPI  Food challenge to shrimp with use of regular  sized shrimp. Benefits and risks of challenge discussed and verbal consent from Victoria Eaton obtained.  She was provided with increasing doses of shrimp every 5-10 minutes and consumed total of 6 shrimp.  She was observed for additional hour after completion of ingestion challenge.  She had no signs/symptoms of allergic reaction.  Vitals were obtained prior to discharge and remained stable.    Assessment and plan: Patient Instructions  1. Anaphylactic shock due to food (kiwi) - Testing was negative to all of the shellfish and very low IgE to shrimp - shrimp challenge performed today in the office today was successfully passed.  She can now incorporate shrimp into the diet.  I have asked that at a minimum she have shellfish 3-4 times a month to maintain a tolerant state - continue avoidance of kiwi - continue to have access to you epinephrine device at all times  2. Seasonal and perennial allergic rhinitis - Testing showed: rabbits, ragweed, weeds, grasses and dust mites - Continue with: Claritin (loratadine) 73m tablet once daily; Flonase (fluticasone) one spray per nostril daily as needed on the worse days.  - You can use an extra dose of the antihistamine, if needed, for breakthrough symptoms.  - Consider nasal saline rinses 1-2 times daily to remove allergens from the nasal cavities as well as help with mucous clearance (this is especially helpful to do before the nasal sprays are given) - Consider allergy shots as a means of long-term control. - Allergy shots "re-train" and "reset" the immune system to ignore environmental allergens and decrease the resulting immune response to those allergens (sneezing, itchy watery eyes, runny nose, nasal congestion, etc).    - Allergy shots improve symptoms in 75-85% of patients.  - Dr. GErnst Bowlercan discuss more at the next appointment if the medications are not working for you.  3. Return in about 4-6 months or sooner if needed   I appreciate the  opportunity to take part in Victoria Eaton's care. Please do not hesitate to contact me with questions.  Sincerely,   Victoria Feeler MD Allergy/Immunology [facility] [place]

## 2018-09-12 DIAGNOSIS — S93602D Unspecified sprain of left foot, subsequent encounter: Secondary | ICD-10-CM | POA: Diagnosis not present

## 2018-10-01 ENCOUNTER — Other Ambulatory Visit: Payer: Self-pay | Admitting: Family Medicine

## 2018-10-01 NOTE — Telephone Encounter (Signed)
Requested Prescriptions  Pending Prescriptions Disp Refills  . INVOKANA 300 MG TABS tablet [Pharmacy Med Name: INVOKANA 300 MG TAB[**]] 30 tablet 0    Sig: TAKE ONE TABLET BY MOUTH ONE TIME DAILY BEFORE BREAKFAST     Endocrinology: Diabetes - SGLT2 Inhibitors - canagliflozin Failed - 10/01/2018  6:23 AM      Failed - Cr in normal range and within 360 days    Creatinine, Ser  Date Value Ref Range Status  08/23/2018 1.20 (H) 0.57 - 1.00 mg/dL Final         Failed - eGFR in normal range and within 360 days    GFR calc Af Amer  Date Value Ref Range Status  08/23/2018 57 (L) >59 mL/min/1.73 Final   GFR calc non Af Amer  Date Value Ref Range Status  08/23/2018 50 (L) >59 mL/min/1.73 Final         Passed - HBA1C is between 0 and 7.9 and within 180 days    Hgb A1c MFr Bld  Date Value Ref Range Status  08/03/2018 7.8 (H) 4.8 - 5.6 % Final    Comment:             Prediabetes: 5.7 - 6.4          Diabetes: >6.4          Glycemic control for adults with diabetes: <7.0          Passed - LDL in normal range and within 360 days    LDL Calculated  Date Value Ref Range Status  08/03/2018 66 0 - 99 mg/dL Final   LDL Cholesterol  Date Value Ref Range Status  08/16/2018 70 0 - 99 mg/dL Final    Comment:           Total Cholesterol/HDL:CHD Risk Coronary Heart Disease Risk Table                     Men   Women  1/2 Average Risk   3.4   3.3  Average Risk       5.0   4.4  2 X Average Risk   9.6   7.1  3 X Average Risk  23.4   11.0        Use the calculated Patient Ratio above and the CHD Risk Table to determine the patient's CHD Risk.        ATP III CLASSIFICATION (LDL):  <100     mg/dL   Optimal  100-129  mg/dL   Near or Above                    Optimal  130-159  mg/dL   Borderline  160-189  mg/dL   High  >190     mg/dL   Very High Performed at [facility] [address], [place], [place] [address]          Passed - Valid encounter within last 6 months    Recent  Outpatient Visits          1 month ago Essential hypertension   Primary Care at [facility], Gelene Mink, PA-C   6 months ago Acute gout involving toe of right foot, unspecified cause   Primary Care at [facility], Gelene Mink, PA-C   7 months ago Type 2 diabetes mellitus with complication, with long-term current use of insulin [facility])   [facility] at [facility], Laurey Arrow, MD   8 months ago  Type 2 diabetes mellitus with hyperglycemia, without long-term current use of insulin [facility])   [facility] at [facility], Laurey Arrow, MD   9 months ago Pleuritic chest pain   Primary Care at [place], PA-C

## 2018-10-08 ENCOUNTER — Other Ambulatory Visit: Payer: Self-pay | Admitting: Family Medicine

## 2018-10-20 DIAGNOSIS — N813 Complete uterovaginal prolapse: Secondary | ICD-10-CM | POA: Diagnosis not present

## 2018-10-20 DIAGNOSIS — I25810 Atherosclerosis of coronary artery bypass graft(s) without angina pectoris: Secondary | ICD-10-CM | POA: Diagnosis not present

## 2018-10-20 DIAGNOSIS — N182 Chronic kidney disease, stage 2 (mild): Secondary | ICD-10-CM | POA: Diagnosis not present

## 2018-10-20 DIAGNOSIS — N393 Stress incontinence (female) (male): Secondary | ICD-10-CM | POA: Diagnosis not present

## 2018-10-28 ENCOUNTER — Other Ambulatory Visit: Payer: Self-pay | Admitting: Physician Assistant

## 2018-10-28 DIAGNOSIS — F329 Major depressive disorder, single episode, unspecified: Secondary | ICD-10-CM

## 2018-10-28 DIAGNOSIS — F419 Anxiety disorder, unspecified: Principal | ICD-10-CM

## 2018-10-28 DIAGNOSIS — F32A Depression, unspecified: Secondary | ICD-10-CM

## 2018-11-03 ENCOUNTER — Emergency Department (HOSPITAL_COMMUNITY): Payer: BLUE CROSS/BLUE SHIELD

## 2018-11-03 ENCOUNTER — Emergency Department (HOSPITAL_COMMUNITY)
Admission: EM | Admit: 2018-11-03 | Discharge: 2018-11-04 | Disposition: A | Discharge status: Home / Self Care | Payer: BLUE CROSS/BLUE SHIELD | Attending: Emergency Medicine | Admitting: Emergency Medicine

## 2018-11-03 ENCOUNTER — Encounter (HOSPITAL_COMMUNITY): Payer: Self-pay | Admitting: Emergency Medicine

## 2018-11-03 DIAGNOSIS — I10 Essential (primary) hypertension: Secondary | ICD-10-CM | POA: Insufficient documentation

## 2018-11-03 DIAGNOSIS — J101 Influenza due to other identified influenza virus with other respiratory manifestations: Secondary | ICD-10-CM | POA: Diagnosis not present

## 2018-11-03 DIAGNOSIS — Z794 Long term (current) use of insulin: Secondary | ICD-10-CM | POA: Insufficient documentation

## 2018-11-03 DIAGNOSIS — Z79899 Other long term (current) drug therapy: Secondary | ICD-10-CM | POA: Insufficient documentation

## 2018-11-03 DIAGNOSIS — Z7982 Long term (current) use of aspirin: Secondary | ICD-10-CM | POA: Diagnosis not present

## 2018-11-03 DIAGNOSIS — F17210 Nicotine dependence, cigarettes, uncomplicated: Secondary | ICD-10-CM | POA: Diagnosis not present

## 2018-11-03 DIAGNOSIS — R079 Chest pain, unspecified: Secondary | ICD-10-CM | POA: Diagnosis not present

## 2018-11-03 DIAGNOSIS — J111 Influenza due to unidentified influenza virus with other respiratory manifestations: Secondary | ICD-10-CM | POA: Diagnosis not present

## 2018-11-03 DIAGNOSIS — R002 Palpitations: Secondary | ICD-10-CM | POA: Diagnosis not present

## 2018-11-03 DIAGNOSIS — E119 Type 2 diabetes mellitus without complications: Secondary | ICD-10-CM | POA: Diagnosis not present

## 2018-11-03 DIAGNOSIS — R0789 Other chest pain: Secondary | ICD-10-CM | POA: Diagnosis not present

## 2018-11-03 LAB — I-STAT BETA HCG BLOOD, ED (MC, WL, AP ONLY): I-stat hCG, quantitative: <5 m[IU]/mL (ref <5)

## 2018-11-03 LAB — CBC
HCT: 36 % (ref 36.0–46.0)
HEMOGLOBIN: 11 g/dL — AB (ref 12.0–15.0)
MCH: 24.9 pg — ABNORMAL LOW (ref 26.0–34.0)
MCHC: 30.6 g/dL (ref 30.0–36.0)
MCV: 81.6 fL (ref 80.0–100.0)
Platelets: 199 10*3/uL (ref 150–400)
RBC: 4.41 MIL/uL (ref 3.87–5.11)
RDW: 17.2 % — ABNORMAL HIGH (ref 11.5–15.5)
WBC: 5.8 10*3/uL (ref 4.0–10.5)
nRBC: 0 % (ref 0.0–0.2)

## 2018-11-03 LAB — BASIC METABOLIC PANEL
Anion gap: 11 (ref 5–15)
BUN: 20 mg/dL (ref 6–20)
CO2: 23 mmol/L (ref 22–32)
Calcium: 9.1 mg/dL (ref 8.9–10.3)
Chloride: 104 mmol/L (ref 98–111)
Creatinine, Ser: 1.51 mg/dL — ABNORMAL HIGH (ref 0.44–1.00)
GFR calc Af Amer: 43 mL/min — ABNORMAL LOW (ref >60)
GFR calc non Af Amer: 37 mL/min — ABNORMAL LOW (ref >60)
GLUCOSE: 208 mg/dL — AB (ref 70–99)
Potassium: 3.8 mmol/L (ref 3.5–5.1)
Sodium: 138 mmol/L (ref 135–145)

## 2018-11-03 MED ORDER — SODIUM CHLORIDE 0.9% FLUSH: 3.0000 mL | Freq: Once | INTRAVENOUS | Status: DC

## 2018-11-03 NOTE — ED Triage Notes (Signed)
Pt reports CP/Palpitations X3 days. Pressure, 3/10. States she took her BP and it was elevated (073'X systolic) Took 1NTG, which helped the pain but the palpitations persisted.

## 2018-11-04 LAB — INFLUENZA PANEL BY PCR (TYPE A & B)
Influenza A By PCR: POSITIVE — AB
Influenza B By PCR: NEGATIVE

## 2018-11-04 LAB — I-STAT TROPONIN, ED
Troponin i, poc: 0 ng/mL (ref 0.00–0.08)
Troponin i, poc: 0 ng/mL (ref 0.00–0.08)

## 2018-11-04 MED ORDER — OSELTAMIVIR PHOSPHATE 75 MG PO CAPS: 75.0000 mg | ORAL_CAPSULE | Freq: Two times a day (BID) | ORAL | 0 refills | Status: DC

## 2018-11-04 MED ORDER — ACETAMINOPHEN 500 MG PO TABS
1000.0000 mg | ORAL_TABLET | Freq: Once | ORAL | Status: AC
  Administered 2018-11-04: 1000 mg via ORAL
  Filled 2018-11-04: qty 2

## 2018-11-04 MED ORDER — IBUPROFEN 800 MG PO TABS
800.0000 mg | ORAL_TABLET | Freq: Once | ORAL | Status: DC
  Filled 2018-11-04: qty 1

## 2018-11-04 MED ORDER — ALBUTEROL SULFATE HFA 108 (90 BASE) MCG/ACT IN AERS
2.0000 | INHALATION_SPRAY | RESPIRATORY_TRACT | Status: DC | PRN
  Administered 2018-11-04: 2 via RESPIRATORY_TRACT
  Filled 2018-11-04: qty 6.7

## 2018-11-04 MED ORDER — HYDROCOD POLST-CPM POLST ER 10-8 MG/5ML PO SUER
5.0000 mL | Freq: Once | ORAL | Status: AC
  Administered 2018-11-04: 5 mL via ORAL
  Filled 2018-11-04: qty 5

## 2018-11-04 MED ORDER — HYDROCOD POLST-CPM POLST ER 10-8 MG/5ML PO SUER: 5.0000 mL | Freq: Two times a day (BID) | ORAL | 0 refills | Status: DC

## 2018-11-04 MED ORDER — OSELTAMIVIR PHOSPHATE 75 MG PO CAPS
75.0000 mg | ORAL_CAPSULE | Freq: Once | ORAL | Status: AC
  Administered 2018-11-04: 75 mg via ORAL
  Filled 2018-11-04: qty 1

## 2018-11-04 NOTE — ED Notes (Signed)
Patient verbalizes understanding of discharge instructions. Opportunity for questioning and answers were provided. Armband removed by staff, pt discharged from ED ambulatory.   

## 2018-11-04 NOTE — Discharge Instructions (Addendum)
Push fluids to stay hydrated. Take Tussionex and use inhaler for cough control. Tylenol for fever and aches. Take Tamiflu as prescribed and follow up with your doctor for recheck in 2-3 days. Return here with any sustained high fever or new symptoms of concern.

## 2018-11-04 NOTE — ED Provider Notes (Signed)
[facility] EMERGENCY DEPARTMENT Provider Note   CSN: [id number] Arrival date & time: 11/03/18  2121     History   Chief Complaint Chief Complaint  Patient presents with  . Chest Pain    HPI Victoria Eaton is a 58 y.o. female.  Patient with history of HTN, HLD, MI, T2DM, arthritis presents with complaint of palpitations that started earlier tonight. She took her blood pressure and found it to be substantially elevated. She took a nitro for her blood pressure which helped but blood pressure went back up prompting a visit to the hospital. She reports chest tightness across her chest and across her posterior shoulders that is chronically intermittent. She states she is developing a sore throat, headache, body aches and cough. She states her daughter was visiting today while she had the flu. No nausea, vomiting. She reports DOE, but no different than her usual.   The history is provided by the patient. No language interpreter was used.    Past Medical History:  Diagnosis Date  . Anxiety   . Arthritis    "maybe in my left foot" (10/14/2017)  . Bladder prolapse, female, acquired 09/14/2017  . Depression   . High cholesterol   . History of stomach ulcers   . Hypertension   . Myocardial infarction ([place]) 12/30/2004  . Tobacco abuse   . Type II diabetes mellitus [facility])     Patient Active Problem List   Diagnosis Date Noted  . Non-ST elevation (NSTEMI) myocardial infarction ([place]) 02/17/2018  . Type 2 diabetes mellitus with complication, with long-term current use of insulin ([place]) 02/17/2018  . Chest pain 01/01/2018  . Unstable angina ([place]) 01/01/2018  . Arm contusion, left, initial encounter 11/25/2017  . Contusion of right knee 11/25/2017  . Hx of CABG 10/19/2017  . Epigastric pain   . Pancreatitis 10/10/2017  . Hyperlipidemia LDL goal <70 09/14/2017  . Essential hypertension 05/01/2017  . Anxiety and depression 05/01/2017  . Tobacco abuse 05/01/2017  .  History of MI (myocardial infarction) 05/01/2017  . Allergic rhinitis 05/01/2017  . History of arthritis 05/01/2017  . Bladder prolapse, female, acquired 11/09/2013    Past Surgical History:  Procedure Laterality Date  . CARDIAC CATHETERIZATION  10/14/2017  . CORONARY ANGIOPLASTY WITH STENT PLACEMENT  12/30/2004   Patient reported  . CORONARY ARTERY BYPASS GRAFT N/A 10/19/2017   Procedure: CORONARY ARTERY BYPASS GRAFTING (CABG) times four using the right saphaneous vein. Harvested endoscopicly and left internal mammary artery.;  Surgeon: Grace Isaac, MD;  Location: [facility];  Service: Open Heart Surgery;  Laterality: N/A;  . DILATION AND CURETTAGE OF UTERUS  1980s  . LEFT HEART CATH AND CORONARY ANGIOGRAPHY N/A 10/14/2017   Procedure: LEFT HEART CATH AND CORONARY ANGIOGRAPHY;  Surgeon: Martinique, Peter M, MD;  Location: [place] CV LAB;  Service: Cardiovascular;  Laterality: N/A;  . LEFT HEART CATH AND CORS/GRAFTS ANGIOGRAPHY N/A 01/01/2018   Procedure: LEFT HEART CATH AND CORS/GRAFTS ANGIOGRAPHY;  Surgeon: Jettie Booze, MD;  Location: [place] [facility] CV LAB;  Service: Cardiovascular;  Laterality: N/A;  . LEFT HEART CATH AND CORS/GRAFTS ANGIOGRAPHY N/A 08/16/2018   Procedure: LEFT HEART CATH AND CORS/GRAFTS ANGIOGRAPHY;  Surgeon: Nelva Bush, MD;  Location: [place] CV LAB;  Service: Cardiovascular;  Laterality: N/A;  . PILONIDAL CYST EXCISION  1980s  . TEE WITHOUT CARDIOVERSION N/A 10/19/2017   Procedure: TRANSESOPHAGEAL ECHOCARDIOGRAM (TEE);  Surgeon: Grace Isaac, MD;  Location: [facility];  Service: Open Heart Surgery;  Laterality: N/A;     OB History   No obstetric history on file.      Home Medications    Prior to Admission medications   Medication Sig Start Date End Date Taking? Authorizing Provider  acetaminophen (TYLENOL) 500 MG tablet Take 1,000 mg by mouth every 6 (six) hours as needed for headache (pain).    [provider]  amLODipine (NORVASC) 5  MG tablet Take 1 tablet (5 mg total) by mouth daily. 09/06/18 12/05/18  Pixie Casino, MD  aspirin 81 MG EC tablet Take 1 tablet (81 mg total) by mouth daily. Patient taking differently: Take 81 mg by mouth at bedtime.  02/11/18   Duke, Tami Lin, PA  atorvastatin (LIPITOR) 80 MG tablet Take 1 tablet (80 mg total) by mouth daily at 6 PM. **NEED OFFICE VISIT WITH FASTING LABS FOR REFILLS** Patient taking differently: Take 80 mg by mouth at bedtime. **NEED OFFICE VISIT WITH FASTING LABS FOR REFILLS** 06/14/18   Shawnee Knapp, MD  buPROPion (WELLBUTRIN XL) 150 MG 24 hr tablet TAKE ONE TABLET BY MOUTH ONE TIME DAILY 10/28/18   Mc[place]ey, Gelene Mink, PA-C  CHANTIX 0.5 MG tablet TAKE ONE TABLET BY MOUTH TWICE A DAY Patient taking differently: Take 0.5 mg by mouth 2 (two) times daily.  08/11/18   Shawnee Knapp, MD  clopidogrel (PLAVIX) 75 MG tablet Take 1 tablet (75 mg total) by mouth daily. 08/11/18   Shawnee Knapp, MD  Continuous Blood Gluc Sensor (FREESTYLE LIBRE 14 DAY SENSOR) MISC 2 Devices by Does not apply route every morning. 04/26/18   Shawnee Knapp, MD  diphenhydramine-acetaminophen (TYLENOL PM) 25-500 MG TABS tablet Take 2 tablets by mouth at bedtime as needed (sleep).    [provider]  EPINEPHrine (EPIPEN 2-PAK) 0.3 mg/0.3 mL IJ SOAJ injection Inject 0.3 mLs (0.3 mg total) into the muscle once as needed. Patient taking differently: Inject 0.3 mg into the muscle once as needed (severe allergic reaction).  05/20/17   Tenna Delaine D, PA-C  fluticasone (FLONASE) 50 MCG/ACT nasal spray Place 1 spray into both nostrils daily as needed for allergies or rhinitis. Patient taking differently: Place 1 spray into both nostrils at bedtime.  08/03/18   Valentina Shaggy, MD  Insulin Pen Needle (PEN NEEDLES) 32G X 4 MM MISC 1 Units by Does not apply route daily. 03/02/18   Shawnee Knapp, MD  INVOKANA 300 MG TABS tablet TAKE ONE TABLET BY MOUTH ONE TIME DAILY BEFORE BREAKFAST 10/01/18   Rutherford Guys, MD  isosorbide mononitrate (IMDUR) 120 MG 24 hr tablet Take 1 tablet (120 mg total) by mouth daily. 08/18/18   Kroeger, Lorelee Cover., PA-C  LANTUS SOLOSTAR 100 UNIT/ML Solostar Pen INJECT 35 UNITS INTO THE SKIN DAILY AT 10PM 10/08/18   Shawnee Knapp, MD  loratadine (CLARITIN) 10 MG tablet Take 1 tablet (10 mg total) by mouth daily. 05/01/17   Tenna Delaine D, PA-C  metoprolol tartrate (LOPRESSOR) 50 MG tablet Take 1 tablet (50 mg total) by mouth 2 (two) times daily. 08/17/18   Kroeger, Lorelee Cover., PA-C  Multiple Vitamin (MULTIVITAMIN WITH MINERALS) TABS tablet Take 1 tablet by mouth daily.    [provider]  nitroGLYCERIN (NITROSTAT) 0.4 MG SL tablet Place 1 tablet (0.4 mg total) under the tongue every 5 (five) minutes as needed for chest pain. 08/18/18 11/16/18  Meng, Isaac Laud, PA  NOVOLOG FLEXPEN 100 UNIT/ML FlexPen INJECT 10 UNITS UNDER SKIN PRIOR TO MEALS AND INJECT  5 UNITS UNDER SKIN FOR CBG >250 Patient taking differently: Inject 5-10 Units into the skin See admin instructions. Inject 10 units subcutaneously three times daily before meals, may also inject 5 units up to twice daily as needed for CBG >250 08/09/18   Shawnee Knapp, MD  polyvinyl alcohol (ARTIFICIAL TEARS) 1.4 % ophthalmic solution Place 1 drop into both eyes 3 (three) times daily as needed for dry eyes.    [provider]  ranolazine (RANEXA) 500 MG 12 hr tablet Take 1 tablet (500 mg total) by mouth 2 (two) times daily. 08/27/18   Hilty, Nadean Corwin, MD  sertraline (ZOLOFT) 50 MG tablet TAKE ONE TABLET BY MOUTH ONE TIME DAILY Patient taking differently: Take 50 mg by mouth daily.  12/04/17   Shawnee Knapp, MD  TURMERIC PO Take 1 capsule by mouth daily.     [provider]  varenicline (CHANTIX STARTING MONTH PAK) 0.5 MG X 11 & 1 MG X 42 tablet Take one 0.5 mg tab po qd x 3d, then increase to one 0.5 mg tab bid x 4 d, then increase to one 1 mg tab bid 04/26/18   Shawnee Knapp, MD  zolpidem (AMBIEN) 10 MG tablet Take 1  tablet (10 mg total) by mouth at bedtime as needed for sleep. **NEED OFFICE VISIT FOR ANY ADDITIONAL REFILLS** 05/05/18   Shawnee Knapp, MD    Family History Family History  Problem Relation Age of Onset  . Heart disease Father   . Hyperlipidemia Father   . Hypertension Father     Social History Social History   Tobacco Use  . Smoking status: Current Some Day Smoker    Packs/day: 0.12    Years: 45.00    Pack years: 5.40    Types: Cigarettes    Start date: 05/01/1972    Last attempt to quit: 09/30/2017    Years since quitting: 1.0  . Smokeless tobacco: Never Used  Substance Use Topics  . Alcohol use: Yes    Alcohol/week: 14.0 standard drinks    Types: 14 Glasses of wine per week    Comment: 2 glasses red wine at night  . Drug use: No     Allergies   Januvia [sitagliptin]; Metformin and related; and Penicillins   Review of Systems Review of Systems  Constitutional: Negative for chills and fever.  HENT: Positive for sore throat. Negative for congestion.   Eyes: Negative for visual disturbance.  Respiratory: Positive for cough (New), chest tightness (Chronic, intermittent) and shortness of breath (Chronic).   Cardiovascular: Positive for palpitations.  Gastrointestinal: Negative.  Negative for abdominal pain and nausea.  Musculoskeletal: Positive for myalgias.  Skin: Negative.   Neurological: Positive for headaches. Negative for weakness.     Physical Exam Updated Vital Signs BP (!) 180/65 (BP Location: Left Arm)   Pulse 100   Temp 98.4 F (36.9 C) (Oral)   Resp 18   SpO2 96%   Physical Exam Vitals signs and nursing note reviewed.  Constitutional:      General: She is not in acute distress.    Appearance: She is well-developed. She is not ill-appearing.  HENT:     Head: Normocephalic.  Neck:     Musculoskeletal: Normal range of motion and neck supple.  Cardiovascular:     Rate and Rhythm: Normal rate and regular rhythm.     Heart sounds: No murmur.    Pulmonary:     Effort: Pulmonary effort is normal. No tachypnea.  Breath sounds: Normal breath sounds. No wheezing, rhonchi or rales.  Abdominal:     General: Bowel sounds are normal.     Palpations: Abdomen is soft.     Tenderness: There is no abdominal tenderness. There is no guarding or rebound.  Musculoskeletal: Normal range of motion.     Right lower leg: No edema.     Left lower leg: No edema.  Skin:    General: Skin is warm and dry.     Findings: No rash.  Neurological:     Mental Status: She is alert and oriented to person, place, and time.      ED Treatments / Results  Labs (all labs ordered are listed, but only abnormal results are displayed) Labs Reviewed  BASIC METABOLIC PANEL - Abnormal; Notable for the following components:      Result Value   Glucose, Bld 208 (*)    Creatinine, Ser 1.51 (*)    GFR calc non Af Amer 37 (*)    GFR calc Af Amer 43 (*)    All other components within normal limits  CBC - Abnormal; Notable for the following components:   Hemoglobin 11.0 (*)    MCH 24.9 (*)    RDW 17.2 (*)    All other components within normal limits  I-STAT BETA HCG BLOOD, ED (MC, [facility], AP ONLY)  I-STAT TROPONIN, ED    EKG EKG Interpretation  Date/Time:  Wednesday November 03 2018 21:30:13 EST Ventricular Rate:  88 PR Interval:  180 QRS Duration: 88 QT Interval:  376 QTC Calculation: 454 R Axis:   8 Text Interpretation:  Undetermined rhythm Anterior infarct , age undetermined Abnormal ECG Confirmed by Dory Horn) on 11/04/2018 2:33:20 AM   Radiology Dg Chest 2 View  Result Date: 11/03/2018 CLINICAL DATA:  Chest tightness with heart palpitations. EXAM: CHEST - 2 VIEW COMPARISON:  08/15/2018 FINDINGS: Stable cardiomegaly with aortic atherosclerosis. No aneurysm. Median sternotomy sutures are in place. Mild chronic diffuse interstitial prominence without alveolar consolidation, effusion or pneumothorax. No acute nor suspicious osseous  abnormalities. Degenerative change along the dorsal spine. IMPRESSION: 1. Stable cardiomegaly with aortic atherosclerosis. 2. Mild chronic diffuse interstitial prominence without alveolar consolidation, effusion or pneumothorax. Electronically Signed   By: Ashley Royalty M.D.   On: 11/03/2018 22:27    Procedures Procedures (including critical care time)  Medications Ordered in ED Medications  sodium chloride flush (NS) 0.9 % injection 3 mL (has no administration in time range)     Initial Impression / Assessment and Plan / ED Course  I have reviewed the triage vital signs and the nursing notes.  Pertinent labs & imaging results that were available during my care of the patient were reviewed by me and considered in my medical decision making (see chart for details).     Patient to ED with palpitations and elevated blood pressure. She endorses tightness in chest and across upper back, and DOE but reports these symptoms are chronic and unchanged. She is developing body aches, cough, sore throat and headache as the night progresses. Exposed to influenza at home by daughter yesterday.   Patient is well appearing. EKG unchanged from previous. Troponin x 2 negative. Symptoms of chest tightness and DOE are chronic and unchanged.   CXR negative. She is found to be influenza positive. She is given Tussionex and an inhaler for cough which seems worse over time. She reports she has taken Tamiflu in the past and that she felt it was beneficial. Will  Rx this medication.   Encouraged to see her primary care physician for recheck in 2-3 days.   Final Clinical Impressions(s) / ED Diagnoses   Final diagnoses:  None   1. Influenza A  ED Discharge Orders    None       Charlann Lange, PA-C 11/04/18 [place], April, MD 11/04/18 [phone]

## 2018-11-07 ENCOUNTER — Other Ambulatory Visit: Payer: Self-pay | Admitting: Family Medicine

## 2018-11-08 ENCOUNTER — Other Ambulatory Visit: Payer: Self-pay | Admitting: *Deleted

## 2018-11-08 MED ORDER — ISOSORBIDE MONONITRATE ER 120 MG PO TB24: 120.0000 mg | ORAL_TABLET | Freq: Every day | ORAL | 3 refills | Status: DC

## 2018-11-08 MED ORDER — METOPROLOL TARTRATE 50 MG PO TABS: 50.0000 mg | ORAL_TABLET | Freq: Two times a day (BID) | ORAL | 3 refills | Status: DC

## 2018-11-08 NOTE — Telephone Encounter (Signed)
Requested medication (s) are due for refill today: Yes  Requested medication (s) are on the active medication list: Yes  Last refill:  10/11/18  Future visit scheduled: No  Notes to clinic:  Unable to refill per protocol, physical appointment needed, courtesy refill already given.     Requested Prescriptions  Pending Prescriptions Disp Refills   INVOKANA 300 MG TABS tablet [Pharmacy Med Name: INVOKANA 300 MG TAB[*]] 30 tablet 0    Sig: TAKE ONE TABLET BY MOUTH ONE TIME DAILY BEFORE BREAKFAST *NEED APPOINTMENT FOR REFILLS*     Endocrinology: Diabetes - SGLT2 Inhibitors - canagliflozin Failed - 11/08/2018 11:41 AM      Failed - Cr in normal range and within 360 days    Creatinine, Ser  Date Value Ref Range Status  11/03/2018 1.51 (H) 0.44 - 1.00 mg/dL Final         Failed - eGFR in normal range and within 360 days    GFR calc Af Amer  Date Value Ref Range Status  11/03/2018 43 (L) >60 mL/min Final   GFR calc non Af Amer  Date Value Ref Range Status  11/03/2018 37 (L) >60 mL/min Final         Passed - HBA1C is between 0 and 7.9 and within 180 days    Hgb A1c MFr Bld  Date Value Ref Range Status  08/03/2018 7.8 (H) 4.8 - 5.6 % Final    Comment:             Prediabetes: 5.7 - 6.4          Diabetes: >6.4          Glycemic control for adults with diabetes: <7.0          Passed - LDL in normal range and within 360 days    LDL Calculated  Date Value Ref Range Status  08/03/2018 66 0 - 99 mg/dL Final   LDL Cholesterol  Date Value Ref Range Status  08/16/2018 70 0 - 99 mg/dL Final    Comment:           Total Cholesterol/HDL:CHD Risk Coronary Heart Disease Risk Table                     Men   Women  1/2 Average Risk   3.4   3.3  Average Risk       5.0   4.4  2 X Average Risk   9.6   7.1  3 X Average Risk  23.4   11.0        Use the calculated Patient Ratio above and the CHD Risk Table to determine the patient's CHD Risk.        ATP III CLASSIFICATION (LDL):  <100     mg/dL   Optimal  100-129  mg/dL   Near or Above                    Optimal  130-159  mg/dL   Borderline  160-189  mg/dL   High  >190     mg/dL   Very High Performed at [facility] [address], [place], [place] [address]          Passed - Valid encounter within last 6 months    Recent Outpatient Visits          3 months ago Essential hypertension   Primary Care at [facility], [place], [place]   8 months  ago Acute gout involving toe of right foot, unspecified cause   Primary Care at [facility], Gelene Mink, PA-C   8 months ago Type 2 diabetes mellitus with complication, with long-term current use of insulin [facility])   [facility] at [facility], Laurey Arrow, MD   10 months ago Type 2 diabetes mellitus with hyperglycemia, without long-term current use of insulin [facility])   Primary Care at [facility], Laurey Arrow, MD   10 months ago Pleuritic chest pain   Primary Care at [place], PA-C

## 2018-11-08 NOTE — Telephone Encounter (Signed)
Patient called, left VM on cell phone to call the office back to schedule a physical in order to receive medication refills.

## 2018-11-09 ENCOUNTER — Telehealth: Payer: Self-pay | Admitting: Family Medicine

## 2018-11-09 MED ORDER — FREESTYLE LIBRE 14 DAY SENSOR MISC: 2.0000 | Freq: Every morning | 5 refills | Status: DC

## 2018-11-09 NOTE — Telephone Encounter (Signed)
Requested Prescriptions  Pending Prescriptions Disp Refills  . Continuous Blood Gluc Sensor (FREESTYLE LIBRE 14 DAY SENSOR) MISC 2 each 5    Sig: 2 Devices by Does not apply route every morning.     Endocrinology: Diabetes - Testing Supplies Passed - 11/09/2018 11:03 AM      Passed - Valid encounter within last 12 months    Recent Outpatient Visits          3 months ago Essential hypertension   Primary Care at [facility], Gelene Mink, PA-C   8 months ago Acute gout involving toe of right foot, unspecified cause   Primary Care at [facility], Gelene Mink, PA-C   8 months ago Type 2 diabetes mellitus with complication, with long-term current use of insulin [facility])   Primary Care at [facility], Laurey Arrow, MD   10 months ago Type 2 diabetes mellitus with hyperglycemia, without long-term current use of insulin [facility])   Primary Care at [facility], Laurey Arrow, MD   10 months ago Pleuritic chest pain   Primary Care at [facility], Audrie Lia, PA-C

## 2018-11-09 NOTE — Telephone Encounter (Signed)
Copied from [place] [id number]. Topic: Quick Communication - Rx Refill/Question >> Nov 09, 2018 10:58 AM Yvette Rack wrote: Medication: Continuous Blood Gluc Sensor (FREESTYLE LIBRE 14 DAY SENSOR) MISC  Has the patient contacted their pharmacy? yes   Preferred Pharmacy (with phone number or street name): [facility] [address] [place], [place].   [phone] (Phone)  [phone] (Fax)  Agent: Please be advised that RX refills may take up to 3 business days. We ask that you follow-up with your pharmacy.

## 2018-11-11 NOTE — Telephone Encounter (Signed)
Pt called in and stated that this med is over $130.00 a month.  She can not afford this med.  She would like to know what options she has.  She stated that there is a medical necessity letter on the web site that can be fill out on her behalf and could help   Best number -[phone] Pharmacy - [facility]

## 2018-11-12 ENCOUNTER — Other Ambulatory Visit: Payer: Self-pay | Admitting: Family Medicine

## 2018-11-15 NOTE — Telephone Encounter (Signed)
pls see note regarding medication too expensive.

## 2018-11-15 NOTE — Telephone Encounter (Signed)
She will have to call her insurance to figure out what the less expensive options are.  The cheapest glucometer is ReliOn from [facility] - you do not need a prescription - it is over the counter.  She will have to look up the price and talk to her insurance to see if there is a cheaper option or if her insurance covers a different type of continuous glucose monitoring system.  If she wants a letter of medical necessity perhaps someone doing prior authorizations can fill this out.   Otherwise, she will need to come in for an appointment (ok to see any provider) to have it done so she can tell us why it is medical necessity an we can provide truthful and accurate but effective info.

## 2018-11-16 ENCOUNTER — Telehealth: Payer: Self-pay

## 2018-11-16 NOTE — Telephone Encounter (Signed)
Copied from [place] [phone]. Topic: Appointment Scheduling - Scheduling Inquiry for Clinic >> Nov 16, 2018 10:51 AM Percell Belt A wrote: Reason for CRM:  Pulmonary office in [place] called and stated that pt just left here office from her appt.  She would be running late to her appt at the [place] office at 11am.  She is coming from [place] and will not make it by 11:10.  Just fyi

## 2018-11-16 NOTE — Telephone Encounter (Signed)
Patient is not being seen in this  Practice today.

## 2018-11-16 NOTE — Telephone Encounter (Signed)
Left voicemail message for pt to call the office regarding glucometer.

## 2018-11-16 NOTE — Telephone Encounter (Signed)
Patient states she is callining about INVOKANA 300 MG TABS tablet and needs Dr Brigitte Pulse to look at form stating its medically necessary, so that way patient can afford the med.

## 2018-11-16 NOTE — Telephone Encounter (Signed)
Please advise 

## 2018-11-17 DIAGNOSIS — N182 Chronic kidney disease, stage 2 (mild): Secondary | ICD-10-CM | POA: Diagnosis not present

## 2018-11-17 DIAGNOSIS — Z4689 Encounter for fitting and adjustment of other specified devices: Secondary | ICD-10-CM | POA: Diagnosis not present

## 2018-11-17 DIAGNOSIS — N813 Complete uterovaginal prolapse: Secondary | ICD-10-CM | POA: Diagnosis not present

## 2018-11-17 DIAGNOSIS — N1330 Unspecified hydronephrosis: Secondary | ICD-10-CM | POA: Diagnosis not present

## 2018-11-17 DIAGNOSIS — N134 Hydroureter: Secondary | ICD-10-CM | POA: Diagnosis not present

## 2018-11-20 ENCOUNTER — Telehealth: Payer: Self-pay | Admitting: Family Medicine

## 2018-11-20 NOTE — Telephone Encounter (Signed)
mychart message sent to patient about appointment with Dr Brigitte Pulse

## 2018-11-22 NOTE — Telephone Encounter (Signed)
Patient is calling for a form to be completed from the [facility] website so the patient can afford the medication.  Theadora Rama said the she will start the PA process for the [employer]. -Patient is requesting a call back when it is completed.  Please advise

## 2018-11-24 ENCOUNTER — Other Ambulatory Visit: Payer: Self-pay | Admitting: Family Medicine

## 2018-11-24 NOTE — Telephone Encounter (Signed)
Requested Prescriptions  Pending Prescriptions Disp Refills  . atorvastatin (LIPITOR) 80 MG tablet [Pharmacy Med Name: ATORVASTATIN 80 MG TAB[*]] 90 tablet 0    Sig: TAKE ONE TABLET BY MOUTH ONE TIME DAILY AT 6PM **NEED OFFICE VISIT WITH FASTING LABS FOR REFILLS**     Cardiovascular:  Antilipid - Statins Failed - 11/24/2018  6:23 AM      Failed - Triglycerides in normal range and within 360 days    Triglycerides  Date Value Ref Range Status  08/16/2018 162 (H) <150 mg/dL Final         Passed - Total Cholesterol in normal range and within 360 days    Cholesterol, Total  Date Value Ref Range Status  08/03/2018 163 100 - 199 mg/dL Final   Cholesterol  Date Value Ref Range Status  08/16/2018 161 0 - 200 mg/dL Final         Passed - LDL in normal range and within 360 days    LDL Calculated  Date Value Ref Range Status  08/03/2018 66 0 - 99 mg/dL Final   LDL Cholesterol  Date Value Ref Range Status  08/16/2018 70 0 - 99 mg/dL Final    Comment:           Total Cholesterol/HDL:CHD Risk Coronary Heart Disease Risk Table                     Men   Women  1/2 Average Risk   3.4   3.3  Average Risk       5.0   4.4  2 X Average Risk   9.6   7.1  3 X Average Risk  23.4   11.0        Use the calculated Patient Ratio above and the CHD Risk Table to determine the patient's CHD Risk.        ATP III CLASSIFICATION (LDL):  <100     mg/dL   Optimal  100-129  mg/dL   Near or Above                    Optimal  130-159  mg/dL   Borderline  160-189  mg/dL   High  >190     mg/dL   Very High Performed at [facility] [address], [place], [place] [address]          Passed - HDL in normal range and within 360 days    HDL  Date Value Ref Range Status  08/16/2018 59 >40 mg/dL Final  08/03/2018 67 >39 mg/dL Final         Passed - Patient is not pregnant      Passed - Valid encounter within last 12 months    Recent Outpatient Visits          3 months ago Essential  hypertension   [facility] Care at [facility], PA-C   8 months ago Acute gout involving toe of right foot, unspecified cause   Primary Care at [facility], Gelene Mink, PA-C   8 months ago Type 2 diabetes mellitus with complication, with long-term current use of insulin [facility])   [facility] at [facility], Laurey Arrow, MD   10 months ago Type 2 diabetes mellitus with hyperglycemia, without long-term current use of insulin [facility])   [facility] at [facility], Laurey Arrow, MD   11 months ago Pleuritic chest pain   Primary Care at [facility], Legrand Como  L, PA-C      Future Appointments            In 1 month Romania, Lilia Argue, MD [facility] at [place], [facility]   In 3 months Hilty, Nadean Corwin, MD [facility], [facility]

## 2018-11-26 DIAGNOSIS — Z4689 Encounter for fitting and adjustment of other specified devices: Secondary | ICD-10-CM | POA: Diagnosis not present

## 2018-11-26 DIAGNOSIS — N813 Complete uterovaginal prolapse: Secondary | ICD-10-CM | POA: Diagnosis not present

## 2018-11-26 DIAGNOSIS — N182 Chronic kidney disease, stage 2 (mild): Secondary | ICD-10-CM | POA: Diagnosis not present

## 2018-11-26 DIAGNOSIS — N1330 Unspecified hydronephrosis: Secondary | ICD-10-CM | POA: Diagnosis not present

## 2018-12-02 ENCOUNTER — Encounter: Payer: BLUE CROSS/BLUE SHIELD | Admitting: Family Medicine

## 2018-12-12 ENCOUNTER — Emergency Department (HOSPITAL_COMMUNITY): Payer: BLUE CROSS/BLUE SHIELD

## 2018-12-12 ENCOUNTER — Other Ambulatory Visit: Payer: Self-pay

## 2018-12-12 ENCOUNTER — Emergency Department (HOSPITAL_COMMUNITY)
Admission: EM | Admit: 2018-12-12 | Discharge: 2018-12-12 | Disposition: A | Discharge status: Home / Self Care | Payer: BLUE CROSS/BLUE SHIELD | Attending: Emergency Medicine | Admitting: Emergency Medicine

## 2018-12-12 ENCOUNTER — Encounter (HOSPITAL_COMMUNITY): Payer: Self-pay | Admitting: Emergency Medicine

## 2018-12-12 DIAGNOSIS — Z79899 Other long term (current) drug therapy: Secondary | ICD-10-CM | POA: Diagnosis not present

## 2018-12-12 DIAGNOSIS — E119 Type 2 diabetes mellitus without complications: Secondary | ICD-10-CM | POA: Insufficient documentation

## 2018-12-12 DIAGNOSIS — R0789 Other chest pain: Secondary | ICD-10-CM | POA: Insufficient documentation

## 2018-12-12 DIAGNOSIS — R079 Chest pain, unspecified: Secondary | ICD-10-CM | POA: Diagnosis not present

## 2018-12-12 DIAGNOSIS — Z951 Presence of aortocoronary bypass graft: Secondary | ICD-10-CM | POA: Diagnosis not present

## 2018-12-12 DIAGNOSIS — Z794 Long term (current) use of insulin: Secondary | ICD-10-CM | POA: Diagnosis not present

## 2018-12-12 DIAGNOSIS — I10 Essential (primary) hypertension: Secondary | ICD-10-CM | POA: Insufficient documentation

## 2018-12-12 DIAGNOSIS — R911 Solitary pulmonary nodule: Secondary | ICD-10-CM | POA: Insufficient documentation

## 2018-12-12 DIAGNOSIS — Z7982 Long term (current) use of aspirin: Secondary | ICD-10-CM | POA: Insufficient documentation

## 2018-12-12 DIAGNOSIS — R0602 Shortness of breath: Secondary | ICD-10-CM | POA: Diagnosis not present

## 2018-12-12 DIAGNOSIS — F17210 Nicotine dependence, cigarettes, uncomplicated: Secondary | ICD-10-CM | POA: Insufficient documentation

## 2018-12-12 DIAGNOSIS — I252 Old myocardial infarction: Secondary | ICD-10-CM | POA: Diagnosis not present

## 2018-12-12 LAB — COMPREHENSIVE METABOLIC PANEL
ALT: 15 U/L (ref 0–44)
AST: 35 U/L (ref 15–41)
Albumin: 3.6 g/dL (ref 3.5–5.0)
Alkaline Phosphatase: 81 U/L (ref 38–126)
Anion gap: 9 (ref 5–15)
BILIRUBIN TOTAL: 1 mg/dL (ref 0.3–1.2)
BUN: 23 mg/dL — ABNORMAL HIGH (ref 6–20)
CO2: 20 mmol/L — ABNORMAL LOW (ref 22–32)
Calcium: 9.1 mg/dL (ref 8.9–10.3)
Chloride: 104 mmol/L (ref 98–111)
Creatinine, Ser: 1.27 mg/dL — ABNORMAL HIGH (ref 0.44–1.00)
GFR calc Af Amer: 53 mL/min — ABNORMAL LOW (ref >60)
GFR calc non Af Amer: 46 mL/min — ABNORMAL LOW (ref >60)
GLUCOSE: 242 mg/dL — AB (ref 70–99)
Potassium: 4.9 mmol/L (ref 3.5–5.1)
Sodium: 133 mmol/L — ABNORMAL LOW (ref 135–145)
TOTAL PROTEIN: 7.7 g/dL (ref 6.5–8.1)

## 2018-12-12 LAB — CBC WITH DIFFERENTIAL/PLATELET
Abs Immature Granulocytes: 0.04 10*3/uL (ref 0.00–0.07)
Basophils Absolute: 0.1 10*3/uL (ref 0.0–0.1)
Basophils Relative: 1 %
Eosinophils Absolute: 0.4 10*3/uL (ref 0.0–0.5)
Eosinophils Relative: 4 %
HCT: 36.5 % (ref 36.0–46.0)
Hemoglobin: 11.1 g/dL — ABNORMAL LOW (ref 12.0–15.0)
Immature Granulocytes: 0 %
Lymphocytes Relative: 27 %
Lymphs Abs: 2.4 10*3/uL (ref 0.7–4.0)
MCH: 24.8 pg — ABNORMAL LOW (ref 26.0–34.0)
MCHC: 30.4 g/dL (ref 30.0–36.0)
MCV: 81.7 fL (ref 80.0–100.0)
MONOS PCT: 7 %
Monocytes Absolute: 0.7 10*3/uL (ref 0.1–1.0)
Neutro Abs: 5.5 10*3/uL (ref 1.7–7.7)
Neutrophils Relative %: 61 %
PLATELETS: 233 10*3/uL (ref 150–400)
RBC: 4.47 MIL/uL (ref 3.87–5.11)
RDW: 17.4 % — ABNORMAL HIGH (ref 11.5–15.5)
WBC: 9 10*3/uL (ref 4.0–10.5)
nRBC: 0 % (ref 0.0–0.2)

## 2018-12-12 LAB — I-STAT TROPONIN, ED
Troponin i, poc: 0 ng/mL (ref 0.00–0.08)
Troponin i, poc: 0 ng/mL (ref 0.00–0.08)

## 2018-12-12 LAB — D-DIMER, QUANTITATIVE: D-Dimer, Quant: 0.9 ug/mL-FEU — ABNORMAL HIGH (ref 0.00–0.50)

## 2018-12-12 MED ORDER — IOHEXOL 350 MG/ML SOLN
100.0000 mL | Freq: Once | INTRAVENOUS | Status: AC | PRN
  Administered 2018-12-12: 100 mL via INTRAVENOUS

## 2018-12-12 NOTE — ED Triage Notes (Signed)
Pt reports central chest pain x3 days worse while lying and deep breathing.  States prior to today pain radiated to left side and jaw.

## 2018-12-12 NOTE — ED Notes (Signed)
Taken to CT at this time. 

## 2018-12-12 NOTE — ED Triage Notes (Signed)
Denies chest pain at this time,.

## 2018-12-12 NOTE — Discharge Instructions (Addendum)
Follow-up with Dr. Debara Pickett.  Call his office and arrange for an appointment.  As we discussed, your CT scan did not show any evidence of blood clot.  There was mention of a pulmonary nodule noted in the right lung.  This needs to be followed up by your primary care doctor.  Please call them and arrange for an appointment within the next 1 to 2 weeks.  Return to the Emergency Department immediately if you experiencing worsening chest pain, difficulty breathing, nausea/vomiting, get very sweaty, headache or any other worsening or concerning symptoms.

## 2018-12-12 NOTE — ED Provider Notes (Signed)
[facility] EMERGENCY DEPARTMENT Provider Note   CSN: [id number] Arrival date & time: 12/12/18  1732    History   Chief Complaint Chief Complaint  Patient presents with   Chest Pain    HPI Victoria Eaton is a 58 y.o. female with PMH/o MI (2006), CABG, HTN. NSTEMI (2019) who presents for evaluation of 3 days of intermittent chest pain.  She describes the pain as "feeling like indigestion but also with some heaviness.." She states she did not get any associated diaphoresis, nausea/vomiting when the chest pain began.  She states that it is not worse with exertion.  She states that the pain is slightly worse with deep inspiration.  She states that the pain has occurred randomly over the last 3 days.  She states that she noticed it while eating lunch today.  She states that she was able to garden for about an hour yesterday without any chest pain.  She states that there is no activity or exertion that brings it on.  She does state it is slightly worse when she lays down.  Patient states today, she checked her blood pressure and noted it to be elevated with systolic blood pressure in the 170s, prompting ED visit. She states she is not currently having any pain.  Patient states that it does not feel similar to when she had the MI in 2006 but does feel similar to when she had an NSTEMI in 2019.  Patient also reports that over the last few days, she has become more aware of shortness of breath.  She states that this is worse with exertion.  She states that she noticed that she was pushing a wheelbarrow yesterday and had to stop because she got short of breath which she states is abnormal for her.  Additionally, she felt like when she would talk, she would get more short of breath.  She does report she had the flu in February 2020 but otherwise denies any sickness.  She has had some lingering cough but states is not productive.  No hemoptysis.  She has not had any sick contacts.  Patient  denies any recent travel. She denies any OCP use, recent immobilization, prior history of DVT/PE, recent surgery, leg swelling, or long travel.  Patient is a former smoker and recently quit last month.  Patient reports she has been compliant with her blood pressure occasions.  Patient denies any numbness/weakness of her arms or legs, fevers, abdominal pain.     The history is provided by the patient.    Past Medical History:  Diagnosis Date   Anxiety    Arthritis    "maybe in my left foot" (10/14/2017)   Bladder prolapse, female, acquired 09/14/2017   Depression    High cholesterol    History of stomach ulcers    Hypertension    Myocardial infarction ([place]) 12/30/2004   Tobacco abuse    Type II diabetes mellitus ([place])     Patient Active Problem List   Diagnosis Date Noted   Non-ST elevation (NSTEMI) myocardial infarction ([place]) 02/17/2018   Type 2 diabetes mellitus with complication, with long-term current use of insulin ([place]) 02/17/2018   Chest pain 01/01/2018   Unstable angina ([place]) 01/01/2018   Arm contusion, left, initial encounter 11/25/2017   Contusion of right knee 11/25/2017   Hx of CABG 10/19/2017   Epigastric pain    Pancreatitis 10/10/2017   Hyperlipidemia LDL goal <70 09/14/2017   Essential hypertension 05/01/2017   Anxiety and  depression 05/01/2017   Tobacco abuse 05/01/2017   History of MI (myocardial infarction) 05/01/2017   Allergic rhinitis 05/01/2017   History of arthritis 05/01/2017   Bladder prolapse, female, acquired 11/09/2013    Past Surgical History:  Procedure Laterality Date   CARDIAC CATHETERIZATION  10/14/2017   CORONARY ANGIOPLASTY WITH STENT PLACEMENT  12/30/2004   Patient reported   CORONARY ARTERY BYPASS GRAFT N/A 10/19/2017   Procedure: CORONARY ARTERY BYPASS GRAFTING (CABG) times four using the right saphaneous vein. Harvested endoscopicly and left internal mammary artery.;  Surgeon: Grace Isaac, MD;   Location: [place];  Service: Open Heart Surgery;  Laterality: N/A;   DILATION AND CURETTAGE OF UTERUS  1980s   LEFT HEART CATH AND CORONARY ANGIOGRAPHY N/A 10/14/2017   Procedure: LEFT HEART CATH AND CORONARY ANGIOGRAPHY;  Surgeon: Martinique, Peter M, MD;  Location: [place] [facility];  Service: Cardiovascular;  Laterality: N/A;   LEFT HEART CATH AND CORS/GRAFTS ANGIOGRAPHY N/A 01/01/2018   Procedure: LEFT HEART CATH AND CORS/GRAFTS ANGIOGRAPHY;  Surgeon: Jettie Booze, MD;  Location: [place] [facility];  Service: Cardiovascular;  Laterality: N/A;   LEFT HEART CATH AND CORS/GRAFTS ANGIOGRAPHY N/A 08/16/2018   Procedure: LEFT HEART CATH AND CORS/GRAFTS ANGIOGRAPHY;  Surgeon: Nelva Bush, MD;  Location: [place] [facility];  Service: Cardiovascular;  Laterality: N/A;   PILONIDAL CYST EXCISION  1980s   TEE WITHOUT CARDIOVERSION N/A 10/19/2017   Procedure: TRANSESOPHAGEAL ECHOCARDIOGRAM (TEE);  Surgeon: Grace Isaac, MD;  Location: [place];  Service: Open Heart Surgery;  Laterality: N/A;     OB History   No obstetric history on file.      Home Medications    Prior to Admission medications   Medication Sig Start Date End Date Taking? Authorizing Provider  acetaminophen (TYLENOL) 500 MG tablet Take 1,000 mg by mouth every 6 (six) hours as needed for headache (pain).   Yes [provider]  amLODipine (NORVASC) 5 MG tablet Take 1 tablet (5 mg total) by mouth daily. 09/06/18 12/12/18 Yes Hilty, Nadean Corwin, MD  aspirin 81 MG EC tablet Take 1 tablet (81 mg total) by mouth daily. Patient taking differently: Take 81 mg by mouth at bedtime.  02/11/18  Yes Duke, Tami Lin, PA  atorvastatin (LIPITOR) 80 MG tablet TAKE ONE TABLET BY MOUTH ONE TIME DAILY AT 6PM **NEED OFFICE VISIT WITH FASTING LABS FOR REFILLS** Patient taking differently: Take 80 mg by mouth daily. At 6pm 11/24/18  Yes Rutherford Guys, MD  buPROPion (WELLBUTRIN XL) 150 MG 24 hr tablet TAKE ONE TABLET BY MOUTH ONE  TIME DAILY Patient taking differently: Take 150 mg by mouth daily.  10/28/18  Yes McVey, Gelene Mink, PA-C  clopidogrel (PLAVIX) 75 MG tablet Take 1 tablet (75 mg total) by mouth daily. 08/11/18  Yes Shawnee Knapp, MD  EPINEPHrine (EPIPEN 2-PAK) 0.3 mg/0.3 mL IJ SOAJ injection Inject 0.3 mLs (0.3 mg total) into the muscle once as needed. Patient taking differently: Inject 0.3 mg into the muscle once as needed (severe allergic reaction).  05/20/17  Yes Wiseman, [place] D, PA-C  INVOKANA 300 MG TABS tablet TAKE ONE TABLET BY MOUTH ONE TIME DAILY BEFORE BREAKFAST *NEED APPOINTMENT FOR REFILLS* Patient taking differently: Take 300 mg by mouth daily.  11/09/18  Yes Rutherford Guys, MD  isosorbide mononitrate (IMDUR) 120 MG 24 hr tablet Take 1 tablet (120 mg total) by mouth daily. 11/08/18  Yes Hilty, Nadean Corwin, MD  LANTUS SOLOSTAR 100 UNIT/ML Solostar Pen  INJECT 35 UNITS INTO THE SKIN DAILY AT 10PM Patient taking differently: Inject 35 Units into the skin at bedtime.  10/08/18  Yes Shawnee Knapp, MD  loratadine (CLARITIN) 10 MG tablet Take 1 tablet (10 mg total) by mouth daily. 05/01/17  Yes Timmothy Euler, Tanzania D, PA-C  metoprolol tartrate (LOPRESSOR) 50 MG tablet Take 1 tablet (50 mg total) by mouth 2 (two) times daily. 11/08/18  Yes Hilty, Nadean Corwin, MD  Multiple Vitamin (MULTIVITAMIN WITH MINERALS) TABS tablet Take 1 tablet by mouth daily.   Yes [provider]  nitroGLYCERIN (NITROSTAT) 0.4 MG SL tablet Place 1 tablet (0.4 mg total) under the tongue every 5 (five) minutes as needed for chest pain. 08/18/18 12/12/18 Yes Meng, Hao, PA  NOVOLOG FLEXPEN 100 UNIT/ML FlexPen INJECT 10 UNITS UNDER SKIN PRIOR TO MEALS AND INJECT 5 UNITS UNDER SKIN FOR CBG >250 Patient taking differently: Inject 5-10 Units into the skin See admin instructions. Inject 10 units subcutaneously three times daily before meals, may also inject 5 units up to twice daily as needed for CBG >250 08/09/18  Yes Shawnee Knapp, MD    ranolazine (RANEXA) 500 MG 12 hr tablet Take 1 tablet (500 mg total) by mouth 2 (two) times daily. 08/27/18  Yes Hilty, Nadean Corwin, MD  sertraline (ZOLOFT) 50 MG tablet TAKE ONE TABLET BY MOUTH ONE TIME DAILY Patient taking differently: Take 50 mg by mouth daily.  12/04/17  Yes Shawnee Knapp, MD  TURMERIC PO Take 1 capsule by mouth daily.    Yes [provider]  CHANTIX 0.5 MG tablet TAKE ONE TABLET BY MOUTH TWICE A DAY Patient not taking: No sig reported 08/11/18   Shawnee Knapp, MD  chlorpheniramine-HYDROcodone [facility] PENNKINETIC ER) 10-8 MG/5ML SUER Take 5 mLs by mouth 2 (two) times daily. Patient not taking: Reported on 12/12/2018 11/04/18   Charlann Lange, PA-C  Continuous Blood Gluc Sensor (FREESTYLE LIBRE 14 DAY SENSOR) MISC 2 Devices by Does not apply route every morning. 11/09/18   Shawnee Knapp, MD  fluticasone [facility]) 50 MCG/ACT nasal spray Place 1 spray into both nostrils daily as needed for allergies or rhinitis. Patient not taking: Reported on 12/12/2018 08/03/18   Valentina Shaggy, MD  Insulin Pen Needle (PEN NEEDLES) 32G X 4 MM MISC 1 Units by Does not apply route daily. 03/02/18   Shawnee Knapp, MD  oseltamivir (TAMIFLU) 75 MG capsule Take 1 capsule (75 mg total) by mouth every 12 (twelve) hours. Patient not taking: Reported on 12/12/2018 11/04/18   Charlann Lange, PA-C  varenicline (CHANTIX STARTING MONTH PAK) 0.5 MG X 11 & 1 MG X 42 tablet Take one 0.5 mg tab po qd x 3d, then increase to one 0.5 mg tab bid x 4 d, then increase to one 1 mg tab bid Patient not taking: Reported on 12/12/2018 04/26/18   Shawnee Knapp, MD  zolpidem (AMBIEN) 10 MG tablet Take 1 tablet (10 mg total) by mouth at bedtime as needed for sleep. **NEED OFFICE VISIT FOR ANY ADDITIONAL REFILLS** Patient not taking: Reported on 12/12/2018 05/05/18   Shawnee Knapp, MD    Family History Family History  Problem Relation Age of Onset   Heart disease Father    Hyperlipidemia Father    Hypertension Father      Social History Social History   Tobacco Use   Smoking status: Current Some Day Smoker    Packs/day: 0.12    Years: 45.00    Pack years:  5.40    Types: Cigarettes    Start date: 05/01/1972    Last attempt to quit: 09/30/2017    Years since quitting: 1.2   Smokeless tobacco: Never Used  Substance Use Topics   Alcohol use: Yes    Alcohol/week: 14.0 standard drinks    Types: 14 Glasses of wine per week    Comment: 2 glasses red wine at night   Drug use: No     Allergies   Januvia [sitagliptin]; Metformin and related; and Penicillins   Review of Systems Review of Systems  Constitutional: Negative for fever.  Respiratory: Positive for cough and shortness of breath.   Cardiovascular: Positive for chest pain. Negative for leg swelling.  Gastrointestinal: Negative for abdominal pain, nausea and vomiting.  Genitourinary: Negative for dysuria and hematuria.  Neurological: Negative for headaches.  All other systems reviewed and are negative.    Physical Exam Updated Vital Signs BP (!) 112/55    Pulse 68    Temp 98.5 F (36.9 C)    Resp 17    Ht 5' 6"  (1.676 m)    Wt 90.7 kg    SpO2 94%    BMI 32.28 kg/m   Physical Exam Vitals signs and nursing note reviewed.  Constitutional:      Appearance: Normal appearance. She is well-developed.  HENT:     Head: Normocephalic and atraumatic.  Eyes:     General: Lids are normal. No scleral icterus.       Right eye: No discharge.        Left eye: No discharge.     Conjunctiva/sclera: Conjunctivae normal.     Pupils: Pupils are equal, round, and reactive to light.  Neck:     Musculoskeletal: Full passive range of motion without pain.  Cardiovascular:     Rate and Rhythm: Normal rate and regular rhythm.     Pulses:          Radial pulses are 2+ on the right side and 2+ on the left side.       Dorsalis pedis pulses are 2+ on the right side and 2+ on the left side.     Heart sounds: Normal heart sounds. No murmur. No friction  rub. No gallop.   Pulmonary:     Effort: Pulmonary effort is normal.     Breath sounds: Normal breath sounds.     Comments: Lungs clear to auscultation bilaterally.  Symmetric chest rise.  No wheezing, rales, rhonchi. Abdominal:     Palpations: Abdomen is soft. Abdomen is not rigid.     Tenderness: There is no abdominal tenderness. There is no guarding.     Comments: Abdomen is soft, non-distended, non-tender. No rigidity, No guarding. No peritoneal signs.  Musculoskeletal: Normal range of motion.  Skin:    General: Skin is warm and dry.     Capillary Refill: Capillary refill takes less than 2 seconds.  Neurological:     Mental Status: She is alert and oriented to person, place, and time.  Psychiatric:        Speech: Speech normal.        Behavior: Behavior normal.      ED Treatments / Results  Labs (all labs ordered are listed, but only abnormal results are displayed) Labs Reviewed  COMPREHENSIVE METABOLIC PANEL - Abnormal; Notable for the following components:      Result Value   Sodium 133 (*)    CO2 20 (*)    Glucose, Bld 242 (*)  BUN 23 (*)    Creatinine, Ser 1.27 (*)    GFR calc non Af Amer 46 (*)    GFR calc Af Amer 53 (*)    All other components within normal limits  CBC WITH DIFFERENTIAL/PLATELET - Abnormal; Notable for the following components:   Hemoglobin 11.1 (*)    MCH 24.8 (*)    RDW 17.4 (*)    All other components within normal limits  D-DIMER, QUANTITATIVE (NOT AT [facility]) - Abnormal; Notable for the following components:   D-Dimer, Quant 0.90 (*)    All other components within normal limits  I-STAT TROPONIN, ED  I-STAT TROPONIN, ED    EKG EKG Interpretation  Date/Time:  Sunday December 12 2018 17:43:45 EDT Ventricular Rate:  71 PR Interval:    QRS Duration: 109 QT Interval:  423 QTC Calculation: 460 R Axis:   22 Text Interpretation:  Sinus rhythm Anterior infarct, old Baseline wander in lead(s) V1 Confirmed by Aletta Edouard [id number]) on  12/12/2018 5:51:39 PM   Radiology Dg Chest 2 View  Result Date: 12/12/2018 CLINICAL DATA:  Central chest pain for several days EXAM: CHEST - 2 VIEW COMPARISON:  11/03/2018 FINDINGS: Cardiac shadow is mildly prominent. Postsurgical changes are noted. Lungs are well aerated bilaterally without focal infiltrate or sizable effusion. Mild interstitial prominence is again noted and stable. No acute bony abnormality is seen. IMPRESSION: Chronic interstitial changes stable from the previous exam. No focal acute abnormality is noted. Electronically Signed   By: Inez Catalina M.D.   On: 12/12/2018 18:31   Ct Angio Chest Pe W And/or Wo Contrast  Result Date: 12/12/2018 CLINICAL DATA:  3 day history of chest pain EXAM: CT ANGIOGRAPHY CHEST WITH CONTRAST TECHNIQUE: Multidetector CT imaging of the chest was performed using the standard protocol during bolus administration of intravenous contrast. Multiplanar CT image reconstructions and MIPs were obtained to evaluate the vascular anatomy. CONTRAST:  158m OMNIPAQUE IOHEXOL 350 MG/ML SOLN COMPARISON:  12/16/2017 FINDINGS: Cardiovascular: The heart size is normal. No substantial pericardial effusion. Status post CABG Atherosclerotic calcification is noted in the wall of the thoracic aorta. No filling defect in the opacified pulmonary arteries to suggest the presence of an acute pulmonary embolus. Mediastinum/Nodes: 12 mm short axis AP window lymph node. Small lymph nodes are seen in the prevascular space in each hilum. The esophagus has normal imaging features. Lungs/Pleura: Centrilobular and paraseptal emphysema evident. 9 mm posterior right lower lobe nodule visible on 67/6. No focal airspace consolidation. Basilar atelectasis bilaterally. No pleural effusion. Upper Abdomen: Unremarkable Musculoskeletal: No worrisome lytic or sclerotic osseous abnormality. Review of the MIP images confirms the above findings. IMPRESSION: 1. No CT evidence for acute pulmonary embolus. 2. 9  mm posterior right lower lobe pulmonary nodule. Consider one of the following in 3 months for both low-risk and high-risk individuals: (a) repeat chest CT, (b) follow-up PET-CT, or (c) tissue sampling. This recommendation follows the consensus statement: Guidelines for Management of Incidental Pulmonary Nodules Detected on CT Images: From the Fleischner Society 2017; Radiology 2017; 284:228-243. Electronically Signed   By: EMisty StanleyM.D.   On: 12/12/2018 20:03    Procedures Procedures (including critical care time)  Medications Ordered in ED Medications  iohexol (OMNIPAQUE) 350 MG/ML injection 100 mL (100 mLs Intravenous Contrast Given 12/12/18 1924)     Initial Impression / Assessment and Plan / ED Course  I have reviewed the triage vital signs and the nursing notes.  Pertinent labs & imaging results that were available during  my care of the patient were reviewed by me and considered in my medical decision making (see chart for details).  Clinical Course as of Dec 11 2145  Sun Mar 22, 626  2137 58 year old female with history of coronary disease and stents here with on and off chest pain pressure with associated shortness of breath.  At times it radiates up into her jaw.  She has a benign exam.  EKG nonspecific similar to prior.  Initial troponin negative.  She still pending d-dimer and likely will least need a delta [place]pe possibly discussed with cardiology.   [MB]    Clinical Course User Index [MB] Hayden Rasmussen, MD       58 year old female who presents for evaluation of 3 days of intermittent chest pain.  She states that today, she noticed her blood pressure was elevated in the 170s which prompted ED visit.  She also reports some intermittent shortness of breath with exertion.  She states that she is noticed when she was pushing a wheelbarrow, she felt like she was getting more short of breath.  He states that her chest pain is unaffected by exertion and is more noticeable with  deep inspiration.  Patient with history of MI and NSTEMI.  She had a recent catheterization done in 2019.  Patient states this feels slightly similar to her NSTEMI but only because at that time, she had some vague discomfort.  Patient states this does not feel like her MI.  No fever, cough.  We will plan to check labs, EKG, chest x-ray.  Additionally, given dyspnea on exertion, will plan for d-dimer for evaluation of PE.   Cath report noted in November 2019 with PCI in the RCA in 2006, STEMI and cath with multivessel CAD s/p CABG in Jan 2019, readmitted in April 2019 with occlusion of the graft to OM diagonal. Repeat cath in Nov 2019 with distally occluded LAD but patent LIMA to LAD and SVG to RCA.  The SVG to D1 and SVG to OM1 are chronically occluded.   Initial troponin negative.  CBC shows no evidence of leukocytosis.  Hemoglobin is 11.1.  CMP shows BUN and creatinine at 23/1.27 respectively.  This consistent with baseline. D-dimer is elevated. She has had elevations in Dimer before but given complaints of SOB and positive D-Dimer will plan to CTA chest.   Patient's history/risk factors, she has a heart score of 4.  At this time, patient's presentation is does not sound typical of ACS but given her history, she does have significant risk factors that will most likely need a delta troponin.  Will consult cardiology.  Discussed patient with Dr. Emilio Aspen (Cardiology) who viewed case and EKG.  Does not sound cardiac in nature, and EKG is reassuring.  Agrees with plan for delta troponin and if negative, and patient is comfortable, plan to discharge home.  CT of chest reviewed.  No evidence of PE that would be causing her symptoms.  There is mention of a 9 mm posterior right lower lobe pulmonary nodule.  Patient is a former smoker.  Discussed results with patient.  We discussed extensively regarding delta troponin here in the ED.  Patient states she is currently not having pain.  She states it will  intermittently come.  Patient does have a heart score of 4 and I did explain that she has significant risk factors.  We discussed that if she is uncomfortable with going home and she continues to have pain, we could potentially admit her  with plans for observation and potential stress test.  Patient states she would rather be discharged home with plans to follow-up with cardiologist on an outpatient basis.  Given reassuring exam, work-up, I feel that this is reasonable. Discussed patient with Dr. Melina Copa who is agreeable.   Delta troponin is negative.  Discussed results with patient.  Patient denies any pain at this time.  Patient agreeable to plan for discharge.  Vital signs stable.  Patient instructed to follow-up with her cardiologist as directed.  Additionally, patient started to follow-up with primary care doctor regarding pulmonary nodule. At this time, patient exhibits no emergent life-threatening condition that require further evaluation in ED or admission. Patient had ample opportunity for questions and discussion. All patient's questions were answered with full understanding. Strict return precautions discussed. Patient expresses understanding and agreement to plan.   Portions of this note were generated with [employer]. Dictation errors may occur despite best attempts at proofreading.   Final Clinical Impressions(s) / ED Diagnoses   Final diagnoses:  Atypical chest pain  Pulmonary nodule    ED Discharge Orders    None       Desma Mcgregor 12/13/18 0154    Hayden Rasmussen, MD 12/13/18 1216

## 2018-12-14 ENCOUNTER — Encounter (HOSPITAL_COMMUNITY): Payer: Self-pay | Admitting: Emergency Medicine

## 2018-12-14 ENCOUNTER — Emergency Department (HOSPITAL_COMMUNITY)
Admission: EM | Admit: 2018-12-14 | Discharge: 2018-12-14 | Disposition: A | Discharge status: Home / Self Care | Payer: BLUE CROSS/BLUE SHIELD | Attending: Emergency Medicine | Admitting: Emergency Medicine

## 2018-12-14 ENCOUNTER — Emergency Department (HOSPITAL_COMMUNITY): Payer: BLUE CROSS/BLUE SHIELD

## 2018-12-14 DIAGNOSIS — F17210 Nicotine dependence, cigarettes, uncomplicated: Secondary | ICD-10-CM | POA: Insufficient documentation

## 2018-12-14 DIAGNOSIS — I10 Essential (primary) hypertension: Secondary | ICD-10-CM | POA: Diagnosis not present

## 2018-12-14 DIAGNOSIS — Z7982 Long term (current) use of aspirin: Secondary | ICD-10-CM | POA: Diagnosis not present

## 2018-12-14 DIAGNOSIS — E1165 Type 2 diabetes mellitus with hyperglycemia: Secondary | ICD-10-CM | POA: Diagnosis not present

## 2018-12-14 DIAGNOSIS — R2990 Unspecified symptoms and signs involving the nervous system: Secondary | ICD-10-CM

## 2018-12-14 DIAGNOSIS — R29818 Other symptoms and signs involving the nervous system: Secondary | ICD-10-CM | POA: Diagnosis not present

## 2018-12-14 DIAGNOSIS — R42 Dizziness and giddiness: Secondary | ICD-10-CM | POA: Diagnosis not present

## 2018-12-14 DIAGNOSIS — R739 Hyperglycemia, unspecified: Secondary | ICD-10-CM

## 2018-12-14 DIAGNOSIS — Z79899 Other long term (current) drug therapy: Secondary | ICD-10-CM | POA: Diagnosis not present

## 2018-12-14 DIAGNOSIS — R110 Nausea: Secondary | ICD-10-CM | POA: Diagnosis not present

## 2018-12-14 DIAGNOSIS — I959 Hypotension, unspecified: Secondary | ICD-10-CM | POA: Diagnosis not present

## 2018-12-14 DIAGNOSIS — I6509 Occlusion and stenosis of unspecified vertebral artery: Secondary | ICD-10-CM | POA: Diagnosis not present

## 2018-12-14 LAB — COMPREHENSIVE METABOLIC PANEL
ALT: 19 U/L (ref 0–44)
AST: 21 U/L (ref 15–41)
Albumin: 3.5 g/dL (ref 3.5–5.0)
Alkaline Phosphatase: 81 U/L (ref 38–126)
Anion gap: 6 (ref 5–15)
BUN: 18 mg/dL (ref 6–20)
CHLORIDE: 102 mmol/L (ref 98–111)
CO2: 26 mmol/L (ref 22–32)
CREATININE: 1.09 mg/dL — AB (ref 0.44–1.00)
Calcium: 9.3 mg/dL (ref 8.9–10.3)
GFR calc Af Amer: >60 mL/min (ref >60)
GFR calc non Af Amer: 56 mL/min — ABNORMAL LOW (ref >60)
Glucose, Bld: 164 mg/dL — ABNORMAL HIGH (ref 70–99)
POTASSIUM: 3.9 mmol/L (ref 3.5–5.1)
Sodium: 134 mmol/L — ABNORMAL LOW (ref 135–145)
Total Bilirubin: 0.4 mg/dL (ref 0.3–1.2)
Total Protein: 7.9 g/dL (ref 6.5–8.1)

## 2018-12-14 LAB — CBC WITH DIFFERENTIAL/PLATELET
Abs Immature Granulocytes: 0.03 10*3/uL (ref 0.00–0.07)
Basophils Absolute: 0 10*3/uL (ref 0.0–0.1)
Basophils Relative: 1 %
Eosinophils Absolute: 0.4 10*3/uL (ref 0.0–0.5)
Eosinophils Relative: 5 %
HCT: 36.2 % (ref 36.0–46.0)
Hemoglobin: 10.9 g/dL — ABNORMAL LOW (ref 12.0–15.0)
Immature Granulocytes: 0 %
LYMPHS PCT: 29 %
Lymphs Abs: 2 10*3/uL (ref 0.7–4.0)
MCH: 24.8 pg — ABNORMAL LOW (ref 26.0–34.0)
MCHC: 30.1 g/dL (ref 30.0–36.0)
MCV: 82.3 fL (ref 80.0–100.0)
Monocytes Absolute: 0.5 10*3/uL (ref 0.1–1.0)
Monocytes Relative: 8 %
Neutro Abs: 4 10*3/uL (ref 1.7–7.7)
Neutrophils Relative %: 57 %
Platelets: 227 10*3/uL (ref 150–400)
RBC: 4.4 MIL/uL (ref 3.87–5.11)
RDW: 17.1 % — ABNORMAL HIGH (ref 11.5–15.5)
WBC: 7 10*3/uL (ref 4.0–10.5)
nRBC: 0 % (ref 0.0–0.2)

## 2018-12-14 LAB — CBG MONITORING, ED: Glucose-Capillary: 172 mg/dL — ABNORMAL HIGH (ref 70–99)

## 2018-12-14 MED ORDER — SODIUM CHLORIDE 0.9 % IV BOLUS
1000.0000 mL | Freq: Once | INTRAVENOUS | Status: AC
  Administered 2018-12-14: 1000 mL via INTRAVENOUS

## 2018-12-14 MED ORDER — DIPHENHYDRAMINE HCL 25 MG PO CAPS
25.0000 mg | ORAL_CAPSULE | Freq: Once | ORAL | Status: AC
  Administered 2018-12-14: 25 mg via ORAL
  Filled 2018-12-14: qty 1

## 2018-12-14 MED ORDER — IOHEXOL 350 MG/ML SOLN
75.0000 mL | Freq: Once | INTRAVENOUS | Status: AC | PRN
  Administered 2018-12-14: 75 mL via INTRAVENOUS

## 2018-12-14 MED ORDER — PROCHLORPERAZINE EDISYLATE 10 MG/2ML IJ SOLN
10.0000 mg | Freq: Once | INTRAMUSCULAR | Status: AC
  Administered 2018-12-14: 10 mg via INTRAVENOUS
  Filled 2018-12-14: qty 2

## 2018-12-14 NOTE — ED Notes (Signed)
Pt verbalized understanding of discharge paperwork and follow-up care.  °

## 2018-12-14 NOTE — ED Notes (Signed)
EDP at bedside. EMS gave 4 zofran

## 2018-12-14 NOTE — Discharge Instructions (Addendum)
Please start to take 325 mg of aspirin daily.  Follow-up with neurology, information is below.  Follow-up with your primary care doctor as well.  Do not drive until you are cleared by physician/visual changes have fully improved.  Please return to the ED if your symptoms worsen.

## 2018-12-14 NOTE — ED Triage Notes (Signed)
Pt arrives via EMS with reports of hyperglycemia of CBG 400 and pt took 15 units of insulin, CBG then 288. Pt normally takes 5 units. Pt reports trouble seeing, reports "strobbing." Pt not able to see left peripheral.

## 2018-12-14 NOTE — ED Provider Notes (Addendum)
[facility] EMERGENCY DEPARTMENT Provider Note   CSN: [id number] Arrival date & time: 12/14/18  1556    History   Chief Complaint Chief Complaint  Patient presents with   Dizziness    HPI Victoria Eaton is a 58 y.o. female.     The history is provided by the patient.  Dizziness  Quality:  Head spinning Severity:  Moderate Onset quality:  Sudden Timing:  Rare Progression:  Resolved Chronicity:  New Context comment:  While driving patient got dizzy, felt like she can see out of her left eye.  Dizziness is now gone.  She states that there is flashing lights in her left eye.  Denies any weakness, speech changes, numbness. Relieved by:  Nothing Worsened by:  Nothing Associated symptoms: headaches and vision changes   Associated symptoms: no blood in stool, no chest pain, no diarrhea, no nausea, no palpitations, no shortness of breath, no vomiting and no weakness   Risk factors: heart disease   Risk factors: no hx of stroke     Past Medical History:  Diagnosis Date   Anxiety    Arthritis    "maybe in my left foot" (10/14/2017)   Bladder prolapse, female, acquired 09/14/2017   Depression    High cholesterol    History of stomach ulcers    Hypertension    Myocardial infarction ([place]) 12/30/2004   Tobacco abuse    Type II diabetes mellitus ([place])     Patient Active Problem List   Diagnosis Date Noted   Non-ST elevation (NSTEMI) myocardial infarction ([place]) 02/17/2018   Type 2 diabetes mellitus with complication, with long-term current use of insulin ([place]) 02/17/2018   Chest pain 01/01/2018   Unstable angina ([facility]) 01/01/2018   Arm contusion, left, initial encounter 11/25/2017   Contusion of right knee 11/25/2017   Hx of CABG 10/19/2017   Epigastric pain    Pancreatitis 10/10/2017   Hyperlipidemia LDL goal <70 09/14/2017   Essential hypertension 05/01/2017   Anxiety and depression 05/01/2017   Tobacco abuse 05/01/2017    History of MI (myocardial infarction) 05/01/2017   Allergic rhinitis 05/01/2017   History of arthritis 05/01/2017   Bladder prolapse, female, acquired 11/09/2013    Past Surgical History:  Procedure Laterality Date   CARDIAC CATHETERIZATION  10/14/2017   CORONARY ANGIOPLASTY WITH STENT PLACEMENT  12/30/2004   Patient reported   CORONARY ARTERY BYPASS GRAFT N/A 10/19/2017   Procedure: CORONARY ARTERY BYPASS GRAFTING (CABG) times four using the right saphaneous vein. Harvested endoscopicly and left internal mammary artery.;  Surgeon: Grace Isaac, MD;  Location: [place];  Service: Open Heart Surgery;  Laterality: N/A;   DILATION AND CURETTAGE OF UTERUS  1980s   LEFT HEART CATH AND CORONARY ANGIOGRAPHY N/A 10/14/2017   Procedure: LEFT HEART CATH AND CORONARY ANGIOGRAPHY;  Surgeon: Martinique, Peter M, MD;  Location: [place] [facility];  Service: Cardiovascular;  Laterality: N/A;   LEFT HEART CATH AND CORS/GRAFTS ANGIOGRAPHY N/A 01/01/2018   Procedure: LEFT HEART CATH AND CORS/GRAFTS ANGIOGRAPHY;  Surgeon: Jettie Booze, MD;  Location: [place] CV LAB;  Service: Cardiovascular;  Laterality: N/A;   LEFT HEART CATH AND CORS/GRAFTS ANGIOGRAPHY N/A 08/16/2018   Procedure: LEFT HEART CATH AND CORS/GRAFTS ANGIOGRAPHY;  Surgeon: Nelva Bush, MD;  Location: [place] CV [facility];  Service: Cardiovascular;  Laterality: N/A;   PILONIDAL CYST EXCISION  1980s   TEE WITHOUT CARDIOVERSION N/A 10/19/2017   Procedure: TRANSESOPHAGEAL ECHOCARDIOGRAM (TEE);  Surgeon: Grace Isaac, MD;  Location: [facility];  Service: Open Heart Surgery;  Laterality: N/A;     OB History   No obstetric history on file.      Home Medications    Prior to Admission medications   Medication Sig Start Date End Date Taking? Authorizing Provider  acetaminophen (TYLENOL) 500 MG tablet Take 1,000 mg by mouth every 6 (six) hours as needed for headache (pain).   Yes [provider]  amLODipine  (NORVASC) 5 MG tablet Take 1 tablet (5 mg total) by mouth daily. 09/06/18 12/14/18 Yes Hilty, Nadean Corwin, MD  aspirin 81 MG EC tablet Take 1 tablet (81 mg total) by mouth daily. Patient taking differently: Take 81 mg by mouth at bedtime.  02/11/18  Yes Duke, Tami Lin, PA  atorvastatin (LIPITOR) 80 MG tablet TAKE ONE TABLET BY MOUTH ONE TIME DAILY AT 6PM **NEED OFFICE VISIT WITH FASTING LABS FOR REFILLS** Patient taking differently: Take 80 mg by mouth daily. At 6pm 11/24/18  Yes Rutherford Guys, MD  buPROPion (WELLBUTRIN XL) 150 MG 24 hr tablet TAKE ONE TABLET BY MOUTH ONE TIME DAILY Patient taking differently: Take 150 mg by mouth daily.  10/28/18  Yes McVey, Gelene Mink, PA-C  clopidogrel (PLAVIX) 75 MG tablet Take 1 tablet (75 mg total) by mouth daily. 08/11/18  Yes Shawnee Knapp, MD  EPINEPHrine (EPIPEN 2-PAK) 0.3 mg/0.3 mL IJ SOAJ injection Inject 0.3 mLs (0.3 mg total) into the muscle once as needed. Patient taking differently: Inject 0.3 mg into the muscle once as needed (severe allergic reaction).  05/20/17  Yes Wiseman, [place] D, PA-C  INVOKANA 300 MG TABS tablet TAKE ONE TABLET BY MOUTH ONE TIME DAILY BEFORE BREAKFAST *NEED APPOINTMENT FOR REFILLS* Patient taking differently: Take 300 mg by mouth daily.  11/09/18  Yes Rutherford Guys, MD  isosorbide mononitrate (IMDUR) 120 MG 24 hr tablet Take 1 tablet (120 mg total) by mouth daily. 11/08/18  Yes Hilty, Nadean Corwin, MD  LANTUS SOLOSTAR 100 UNIT/ML Solostar Pen INJECT 35 UNITS INTO THE SKIN DAILY AT 10PM Patient taking differently: Inject 35 Units into the skin at bedtime.  10/08/18  Yes Shawnee Knapp, MD  loratadine (CLARITIN) 10 MG tablet Take 1 tablet (10 mg total) by mouth daily. 05/01/17  Yes Timmothy Euler, [place] D, PA-C  metoprolol tartrate (LOPRESSOR) 50 MG tablet Take 1 tablet (50 mg total) by mouth 2 (two) times daily. 11/08/18  Yes Hilty, Nadean Corwin, MD  Multiple Vitamin (MULTIVITAMIN WITH MINERALS) TABS tablet Take 1 tablet by mouth  daily.   Yes [provider]  NOVOLOG FLEXPEN 100 UNIT/ML FlexPen INJECT 10 UNITS UNDER SKIN PRIOR TO MEALS AND INJECT 5 UNITS UNDER SKIN FOR CBG >250 Patient taking differently: Inject 5-10 Units into the skin See admin instructions. Inject 10 units subcutaneously three times daily before meals, may also inject 5 units up to twice daily as needed for CBG >250 08/09/18  Yes Shawnee Knapp, MD  ranolazine (RANEXA) 500 MG 12 hr tablet Take 1 tablet (500 mg total) by mouth 2 (two) times daily. 08/27/18  Yes Hilty, Nadean Corwin, MD  sertraline (ZOLOFT) 50 MG tablet TAKE ONE TABLET BY MOUTH ONE TIME DAILY Patient taking differently: Take 50 mg by mouth daily.  12/04/17  Yes Shawnee Knapp, MD  TURMERIC PO Take 1 capsule by mouth daily.    Yes [provider]  CHANTIX 0.5 MG tablet TAKE ONE TABLET BY MOUTH TWICE A DAY Patient not taking: No sig reported 08/11/18  Shawnee Knapp, MD  chlorpheniramine-HYDROcodone [facility] PENNKINETIC ER) 10-8 MG/5ML SUER Take 5 mLs by mouth 2 (two) times daily. Patient not taking: Reported on 12/12/2018 11/04/18   Charlann Lange, PA-C  Continuous Blood Gluc Sensor (FREESTYLE LIBRE 14 DAY SENSOR) MISC 2 Devices by Does not apply route every morning. 11/09/18   Shawnee Knapp, MD  fluticasone [facility]) 50 MCG/ACT nasal spray Place 1 spray into both nostrils daily as needed for allergies or rhinitis. Patient not taking: Reported on 12/12/2018 08/03/18   Valentina Shaggy, MD  Insulin Pen Needle (PEN NEEDLES) 32G X 4 MM MISC 1 Units by Does not apply route daily. 03/02/18   Shawnee Knapp, MD  nitroGLYCERIN (NITROSTAT) 0.4 MG SL tablet Place 1 tablet (0.4 mg total) under the tongue every 5 (five) minutes as needed for chest pain. 08/18/18 12/12/18  Almyra Deforest, PA  oseltamivir (TAMIFLU) 75 MG capsule Take 1 capsule (75 mg total) by mouth every 12 (twelve) hours. Patient not taking: Reported on 12/12/2018 11/04/18   Charlann Lange, PA-C  varenicline (CHANTIX STARTING MONTH PAK) 0.5  MG X 11 & 1 MG X 42 tablet Take one 0.5 mg tab po qd x 3d, then increase to one 0.5 mg tab bid x 4 d, then increase to one 1 mg tab bid Patient not taking: Reported on 12/12/2018 04/26/18   Shawnee Knapp, MD  zolpidem (AMBIEN) 10 MG tablet Take 1 tablet (10 mg total) by mouth at bedtime as needed for sleep. **NEED OFFICE VISIT FOR ANY ADDITIONAL REFILLS** Patient not taking: Reported on 12/12/2018 05/05/18   Shawnee Knapp, MD    Family History Family History  Problem Relation Age of Onset   Heart disease Father    Hyperlipidemia Father    Hypertension Father     Social History Social History   Tobacco Use   Smoking status: Current Some Day Smoker    Packs/day: 0.12    Years: 45.00    Pack years: 5.40    Types: Cigarettes    Start date: 05/01/1972    Last attempt to quit: 09/30/2017    Years since quitting: 1.2   Smokeless tobacco: Never Used  Substance Use Topics   Alcohol use: Yes    Alcohol/week: 14.0 standard drinks    Types: 14 Glasses of wine per week    Comment: 2 glasses red wine at night   Drug use: No     Allergies   Januvia [sitagliptin]; Metformin and related; and Penicillins   Review of Systems Review of Systems  Constitutional: Negative for chills and fever.  HENT: Negative for ear pain and sore throat.   Eyes: Positive for photophobia and visual disturbance. Negative for pain.  Respiratory: Negative for cough and shortness of breath.   Cardiovascular: Negative for chest pain and palpitations.  Gastrointestinal: Negative for abdominal pain, blood in stool, diarrhea, nausea and vomiting.  Genitourinary: Negative for dysuria and hematuria.  Musculoskeletal: Negative for arthralgias and back pain.  Skin: Negative for color change and rash.  Neurological: Positive for dizziness and headaches. Negative for tremors, seizures, syncope, facial asymmetry, speech difficulty, weakness, light-headedness and numbness.  All other systems reviewed and are  negative.    Physical Exam Updated Vital Signs BP 132/64    Pulse (!) 58    Resp 18    Ht 5' 6"  (1.676 m)    Wt 90.9 kg    SpO2 91%    BMI 32.35 kg/m   Physical Exam Vitals signs  and nursing note reviewed.  Constitutional:      General: She is not in acute distress.    Appearance: She is well-developed.  HENT:     Head: Normocephalic and atraumatic.     Nose: Nose normal.     Mouth/Throat:     Mouth: Mucous membranes are moist.  Eyes:     Extraocular Movements: Extraocular movements intact.     Conjunctiva/sclera: Conjunctivae normal.     Pupils: Pupils are equal, round, and reactive to light.     Comments: 20/20 vision on the each eye, appears to have possible left peripheral field deficit, states that she sees blurry/flashing lights in the left peripheral vision, no obvious afferent pupillary defect  Neck:     Musculoskeletal: Normal range of motion and neck supple.  Cardiovascular:     Rate and Rhythm: Normal rate and regular rhythm.     Pulses: Normal pulses.     Heart sounds: Normal heart sounds. No murmur.  Pulmonary:     Effort: Pulmonary effort is normal. No respiratory distress.     Breath sounds: Normal breath sounds.  Abdominal:     General: There is no distension.     Palpations: Abdomen is soft.     Tenderness: There is no abdominal tenderness.  Musculoskeletal: Normal range of motion.  Skin:    General: Skin is warm and dry.     Capillary Refill: Capillary refill takes less than 2 seconds.  Neurological:     General: No focal deficit present.     Mental Status: She is alert.     Comments: 5+ out of 5 strength throughout, normal sensation, no drift, normal finger-to-nose finger, normal speech  Psychiatric:        Mood and Affect: Mood normal.      ED Treatments / Results  Labs (all labs ordered are listed, but only abnormal results are displayed) Labs Reviewed  COMPREHENSIVE METABOLIC PANEL - Abnormal; Notable for the following components:       Result Value   Sodium 134 (*)    Glucose, Bld 164 (*)    Creatinine, Ser 1.09 (*)    GFR calc non Af Amer 56 (*)    All other components within normal limits  CBC WITH DIFFERENTIAL/PLATELET - Abnormal; Notable for the following components:   Hemoglobin 10.9 (*)    MCH 24.8 (*)    RDW 17.1 (*)    All other components within normal limits  CBG MONITORING, ED - Abnormal; Notable for the following components:   Glucose-Capillary 172 (*)    All other components within normal limits  URINALYSIS, ROUTINE W REFLEX MICROSCOPIC    EKG EKG Interpretation  Date/Time:  Tuesday December 14 2018 16:02:10 EDT Ventricular Rate:  64 PR Interval:    QRS Duration: 104 QT Interval:  411 QTC Calculation: 424 R Axis:   5 Text Interpretation:  Sinus rhythm Probable anteroseptal infarct, old Borderline repolarization abnormality Confirmed by Lennice Sites [phone]) on 12/14/2018 4:53:23 PM   Radiology Ct Angio Head W Or Wo Contrast  Result Date: 12/14/2018 CLINICAL DATA:  Acute presentation with hyperglycemia and visual disturbance. EXAM: CT ANGIOGRAPHY HEAD AND NECK TECHNIQUE: Multidetector CT imaging of the head and neck was performed using the standard protocol during bolus administration of intravenous contrast. Multiplanar CT image reconstructions and MIPs were obtained to evaluate the vascular anatomy. Carotid stenosis measurements (when applicable) are obtained utilizing NASCET criteria, using the distal internal carotid diameter as the denominator. CONTRAST:  7m OMNIPAQUE  IOHEXOL 350 MG/ML SOLN COMPARISON:  No previous CNS imaging FINDINGS: CT HEAD FINDINGS Brain: Mild generalized brain atrophy with chronic appearing small vessel ischemic changes throughout the cerebral hemispheric white matter. Probable late subacute to old infarction in the right parieto-occipital junction region. No evidence of mass effect or hemorrhage. No hydrocephalus or extra-axial collection. Vascular: There is atherosclerotic  calcification of the major vessels at the base of the brain. Skull: Negative Sinuses: Clear Orbits: Negative Review of the MIP images confirms the above findings CTA NECK FINDINGS Aortic arch: Previous median sternotomy and CABG. Aortic atherosclerosis with soft and calcified plaque. Branching pattern is normal. No flow limiting origin stenosis. Right carotid system: Common carotid artery shows some scattered plaque but is widely patent to the distal 2 cm. There is 40% stenosis of the common carotid artery proximal to the bifurcation. At the carotid bifurcation, there is complex soft and calcified plaque. There is some motion degradation in this region unfortunately. I think the minimal diameter in the ICA bulb region is 1.5 mm. Compared to a more distal cervical ICA measurement of 5 mm, this indicates a 70% stenosis. Left carotid system: Common carotid artery shows scattered plaque but is widely patent to the bifurcation region. Extensive calcified plaque at the bifurcation and ICA bulb. Minimal diameter in the ICA bulb is 1.5 mm. Compared to a more distal cervical ICA diameter of 5 mm, this indicates a 70% stenosis. Vertebral arteries: Both vertebral artery origins are widely patent. Both vertebral arteries are widely patent through the cervical region to the foramen magnum. Skeleton: Negative Other neck: No mass or lymphadenopathy. Upper chest: Mild upper lobe scarring. Review of the MIP images confirms the above findings CTA HEAD FINDINGS Anterior circulation: Both internal carotid arteries are patent through the skull base and siphon regions. There is atherosclerotic calcification throughout the carotid siphon regions. Stenosis this region estimated at 50% on each side. The anterior and middle cerebral vessels are patent without proximal stenosis, aneurysm or vascular malformation. No occluded major branch vessels are identified. Posterior circulation: Both vertebral arteries are patent at the foramen magnum.  There is 50-70% stenosis of the left V4 segment. Both vertebral arteries reach the basilar. Mild atherosclerotic change of the basilar but no stenosis. Posterior circulation branch vessels show flow, including the right PCA. Venous sinuses: Patent and normal. Anatomic variants: None significant. Delayed phase: No abnormal enhancement. Review of the MIP images confirms the above findings IMPRESSION: Head CT: Late subacute to old infarction at the right parieto-occipital junction. Extensive chronic small-vessel ischemic changes elsewhere throughout the brain. CT angiography: Aortic atherosclerosis. Atherosclerotic disease at both carotid bifurcation and ICA bulb regions. 70% stenosis identified in both ICA bulb regions. Considerable irregularity on the right. 50-70% stenosis of the left V4 segment of the vertebral artery. No intracranial large or medium vessel occlusion identified presently. Electronically Signed   By: Nelson Chimes M.D.   On: 12/14/2018 17:28   Dg Chest 2 View  Result Date: 12/12/2018 CLINICAL DATA:  Central chest pain for several days EXAM: CHEST - 2 VIEW COMPARISON:  11/03/2018 FINDINGS: Cardiac shadow is mildly prominent. Postsurgical changes are noted. Lungs are well aerated bilaterally without focal infiltrate or sizable effusion. Mild interstitial prominence is again noted and stable. No acute bony abnormality is seen. IMPRESSION: Chronic interstitial changes stable from the previous exam. No focal acute abnormality is noted. Electronically Signed   By: Inez Catalina M.D.   On: 12/12/2018 18:31   Ct Angio Neck W And/or Wo  Contrast  Result Date: 12/14/2018 CLINICAL DATA:  Acute presentation with hyperglycemia and visual disturbance. EXAM: CT ANGIOGRAPHY HEAD AND NECK TECHNIQUE: Multidetector CT imaging of the head and neck was performed using the standard protocol during bolus administration of intravenous contrast. Multiplanar CT image reconstructions and MIPs were obtained to evaluate  the vascular anatomy. Carotid stenosis measurements (when applicable) are obtained utilizing NASCET criteria, using the distal internal carotid diameter as the denominator. CONTRAST:  50m OMNIPAQUE IOHEXOL 350 MG/ML SOLN COMPARISON:  No previous CNS imaging FINDINGS: CT HEAD FINDINGS Brain: Mild generalized brain atrophy with chronic appearing small vessel ischemic changes throughout the cerebral hemispheric white matter. Probable late subacute to old infarction in the right parieto-occipital junction region. No evidence of mass effect or hemorrhage. No hydrocephalus or extra-axial collection. Vascular: There is atherosclerotic calcification of the major vessels at the base of the brain. Skull: Negative Sinuses: Clear Orbits: Negative Review of the MIP images confirms the above findings CTA NECK FINDINGS Aortic arch: Previous median sternotomy and CABG. Aortic atherosclerosis with soft and calcified plaque. Branching pattern is normal. No flow limiting origin stenosis. Right carotid system: Common carotid artery shows some scattered plaque but is widely patent to the distal 2 cm. There is 40% stenosis of the common carotid artery proximal to the bifurcation. At the carotid bifurcation, there is complex soft and calcified plaque. There is some motion degradation in this region unfortunately. I think the minimal diameter in the ICA bulb region is 1.5 mm. Compared to a more distal cervical ICA measurement of 5 mm, this indicates a 70% stenosis. Left carotid system: Common carotid artery shows scattered plaque but is widely patent to the bifurcation region. Extensive calcified plaque at the bifurcation and ICA bulb. Minimal diameter in the ICA bulb is 1.5 mm. Compared to a more distal cervical ICA diameter of 5 mm, this indicates a 70% stenosis. Vertebral arteries: Both vertebral artery origins are widely patent. Both vertebral arteries are widely patent through the cervical region to the foramen magnum. Skeleton:  Negative Other neck: No mass or lymphadenopathy. Upper chest: Mild upper lobe scarring. Review of the MIP images confirms the above findings CTA HEAD FINDINGS Anterior circulation: Both internal carotid arteries are patent through the skull base and siphon regions. There is atherosclerotic calcification throughout the carotid siphon regions. Stenosis this region estimated at 50% on each side. The anterior and middle cerebral vessels are patent without proximal stenosis, aneurysm or vascular malformation. No occluded major branch vessels are identified. Posterior circulation: Both vertebral arteries are patent at the foramen magnum. There is 50-70% stenosis of the left V4 segment. Both vertebral arteries reach the basilar. Mild atherosclerotic change of the basilar but no stenosis. Posterior circulation branch vessels show flow, including the right PCA. Venous sinuses: Patent and normal. Anatomic variants: None significant. Delayed phase: No abnormal enhancement. Review of the MIP images confirms the above findings IMPRESSION: Head CT: Late subacute to old infarction at the right parieto-occipital junction. Extensive chronic small-vessel ischemic changes elsewhere throughout the brain. CT angiography: Aortic atherosclerosis. Atherosclerotic disease at both carotid bifurcation and ICA bulb regions. 70% stenosis identified in both ICA bulb regions. Considerable irregularity on the right. 50-70% stenosis of the left V4 segment of the vertebral artery. No intracranial large or medium vessel occlusion identified presently. Electronically Signed   By: MNelson ChimesM.D.   On: 12/14/2018 17:28   Ct Angio Chest Pe W And/or Wo Contrast  Result Date: 12/12/2018 CLINICAL DATA:  3 day history of  chest pain EXAM: CT ANGIOGRAPHY CHEST WITH CONTRAST TECHNIQUE: Multidetector CT imaging of the chest was performed using the standard protocol during bolus administration of intravenous contrast. Multiplanar CT image reconstructions  and MIPs were obtained to evaluate the vascular anatomy. CONTRAST:  18m OMNIPAQUE IOHEXOL 350 MG/ML SOLN COMPARISON:  12/16/2017 FINDINGS: Cardiovascular: The heart size is normal. No substantial pericardial effusion. Status post CABG Atherosclerotic calcification is noted in the wall of the thoracic aorta. No filling defect in the opacified pulmonary arteries to suggest the presence of an acute pulmonary embolus. Mediastinum/Nodes: 12 mm short axis AP window lymph node. Small lymph nodes are seen in the prevascular space in each hilum. The esophagus has normal imaging features. Lungs/Pleura: Centrilobular and paraseptal emphysema evident. 9 mm posterior right lower lobe nodule visible on 67/6. No focal airspace consolidation. Basilar atelectasis bilaterally. No pleural effusion. Upper Abdomen: Unremarkable Musculoskeletal: No worrisome lytic or sclerotic osseous abnormality. Review of the MIP images confirms the above findings. IMPRESSION: 1. No CT evidence for acute pulmonary embolus. 2. 9 mm posterior right lower lobe pulmonary nodule. Consider one of the following in 3 months for both low-risk and high-risk individuals: (a) repeat chest CT, (b) follow-up PET-CT, or (c) tissue sampling. This recommendation follows the consensus statement: Guidelines for Management of Incidental Pulmonary Nodules Detected on CT Images: From the Fleischner Society 2017; Radiology 2017; 284:228-243. Electronically Signed   By: EMisty StanleyM.D.   On: 12/12/2018 20:03    Procedures .Critical Care Performed by: CLennice Sites DO Authorized by: CLennice Sites DO   Critical care provider statement:    Critical care time (minutes):  45   Critical care time was exclusive of:  Separately billable procedures and treating other patients   Critical care was necessary to treat or prevent imminent or life-threatening deterioration of the following conditions:  CNS failure or compromise   Critical care was time spent personally  by me on the following activities:  Blood draw for specimens, development of treatment plan with patient or surrogate, discussions with consultants, evaluation of patient's response to treatment, examination of patient, obtaining history from patient or surrogate, pulse oximetry, re-evaluation of patient's condition, ordering and performing treatments and interventions, ordering and review of laboratory studies and ordering and review of radiographic studies   I assumed direction of critical care for this patient from another provider in my specialty: no     (including critical care time)  Medications Ordered in ED Medications  prochlorperazine (COMPAZINE) injection 10 mg (10 mg Intravenous Given 12/14/18 1632)  diphenhydrAMINE (BENADRYL) capsule 25 mg (25 mg Oral Given 12/14/18 1631)  sodium chloride 0.9 % bolus 1,000 mL (0 mLs Intravenous Stopped 12/14/18 1800)  iohexol (OMNIPAQUE) 350 MG/ML injection 75 mL (75 mLs Intravenous Contrast Given 12/14/18 1701)     Initial Impression / Assessment and Plan / ED Course  I have reviewed the triage vital signs and the nursing notes.  Pertinent labs & imaging results that were available during my care of the patient were reviewed by me and considered in my medical decision making (see chart for details).     CSantresa Levettis a 58year old female with history of CAD status post CABG, hypertension who presents to the ED with dizziness, left eye problems.  Patient with normal vitals.  No fever.  Patient states that she was driving when she got dizzy, had difficulty seeing out of her left eye.  Has a history of diabetes.  States that she had high blood sugar of  450 earlier this morning and took extra insulin and has now improved.  Blood sugar upon arrival was 172.  Patient otherwise does not appear to have any other strokelike symptoms on exam.  She does possibly have a left visual field deficit but overall difficult to evaluate as she is not a straightforward  exam.  She has 20/20 vision in each eye.  She does not appear to have an a fair pupillary defect on exam.  She overall appears to have normal neuro exam except for possible left peripheral field deficit.  We will get a CTA of the head and neck to further evaluate.  Discussed on the phone with neurology who recommended that imaging.  Will likely recommend MRI if imaging is normal.  Patient has no aphasia, no weakness, no numbness.  She does have a mild headache.  History of headaches.  Could be a complex migraine will treat with Compazine, Benadryl, fluids.  Patient denies any chest pain, shortness of breath.  EKG shows sinus rhythm.  No signs of ischemic changes.  No significant electrolyte abnormality, kidney injury, leukocytosis.  CT scan showed possible subacute to chronic left parietal-occipital infarct.  Neurology recommended an MRI to further evaluate but patient would like to leave [place].  Therefore, neurology recommends the patient be started on full dose of aspirin.  Recommended that she follow-up closely with neurology, primary care doctor outpatient.  Patient understands the risks and benefits versus staying for MRI.  She prefers to follow-up outpatient and does not want to stay in the ED.  She understands that she could have an acute stroke.  She understands that this could lead to further strokes and more permanent disability than she is currently having.  Overall she states that her symptoms appear improved.  Recommend that she does not drive until she is cleared by neurology or until visual deficits improve fully.  She is given information to follow-up with neurology, discharged from the ED [place].  This chart was dictated using voice recognition software.  Despite best efforts to proofread,  errors can occur which can change the documentation meaning.    Final Clinical Impressions(s) / ED Diagnoses   Final diagnoses:  Stroke-like symptoms  Hyperglycemia     ED Discharge Orders    None       Lennice Sites, DO 12/14/18 1756    Lennice Sites, DO 12/14/18 1801

## 2018-12-25 ENCOUNTER — Other Ambulatory Visit: Payer: Self-pay | Admitting: Family Medicine

## 2018-12-29 ENCOUNTER — Other Ambulatory Visit: Payer: Self-pay | Admitting: Family Medicine

## 2018-12-29 ENCOUNTER — Other Ambulatory Visit: Payer: Self-pay

## 2018-12-29 MED ORDER — RANOLAZINE ER 500 MG PO TB12: 500.0000 mg | ORAL_TABLET | Freq: Two times a day (BID) | ORAL | 2 refills | Status: DC

## 2018-12-31 ENCOUNTER — Other Ambulatory Visit: Payer: Self-pay

## 2018-12-31 MED ORDER — RANOLAZINE ER 500 MG PO TB12: 500.0000 mg | ORAL_TABLET | Freq: Two times a day (BID) | ORAL | 2 refills | Status: DC

## 2019-01-04 ENCOUNTER — Other Ambulatory Visit: Payer: Self-pay

## 2019-01-04 DIAGNOSIS — Z1389 Encounter for screening for other disorder: Secondary | ICD-10-CM

## 2019-01-04 DIAGNOSIS — Z13220 Encounter for screening for lipoid disorders: Secondary | ICD-10-CM

## 2019-01-04 DIAGNOSIS — Z1329 Encounter for screening for other suspected endocrine disorder: Secondary | ICD-10-CM

## 2019-01-11 ENCOUNTER — Telehealth: Payer: Self-pay | Admitting: Family Medicine

## 2019-01-11 NOTE — Telephone Encounter (Signed)
LVM for pt to CB and convert to [employer] /   FR

## 2019-01-14 ENCOUNTER — Encounter: Payer: Self-pay | Admitting: Family Medicine

## 2019-01-14 ENCOUNTER — Other Ambulatory Visit: Payer: Self-pay

## 2019-01-14 ENCOUNTER — Ambulatory Visit (INDEPENDENT_AMBULATORY_CARE_PROVIDER_SITE_OTHER): Payer: BLUE CROSS/BLUE SHIELD | Admitting: Family Medicine

## 2019-01-14 DIAGNOSIS — F17200 Nicotine dependence, unspecified, uncomplicated: Secondary | ICD-10-CM | POA: Diagnosis not present

## 2019-01-14 DIAGNOSIS — F419 Anxiety disorder, unspecified: Secondary | ICD-10-CM

## 2019-01-14 DIAGNOSIS — E1165 Type 2 diabetes mellitus with hyperglycemia: Secondary | ICD-10-CM | POA: Diagnosis not present

## 2019-01-14 DIAGNOSIS — F329 Major depressive disorder, single episode, unspecified: Secondary | ICD-10-CM

## 2019-01-14 DIAGNOSIS — F32A Depression, unspecified: Secondary | ICD-10-CM

## 2019-01-14 DIAGNOSIS — M25551 Pain in right hip: Secondary | ICD-10-CM

## 2019-01-14 DIAGNOSIS — Z794 Long term (current) use of insulin: Secondary | ICD-10-CM

## 2019-01-14 MED ORDER — TRAMADOL HCL 50 MG PO TABS: 50.0000 mg | ORAL_TABLET | Freq: Four times a day (QID) | ORAL | 0 refills | Status: AC | PRN

## 2019-01-14 MED ORDER — BUPROPION HCL ER (XL) 300 MG PO TB24: 300.0000 mg | ORAL_TABLET | Freq: Every day | ORAL | 1 refills | Status: DC

## 2019-01-14 NOTE — Progress Notes (Signed)
Virtual Visit Note  I connected with patient on 01/14/19 at 1035am by telephone and verified that I am speaking with the correct person using two identifiers. Victoria Eaton is currently located at home and patient is currently with them during visit. The provider, Rutherford Guys, MD is located in their office at time of visit.  I discussed the limitations, risks, security and privacy concerns of performing an evaluation and management service by telephone and the availability of in person appointments. I also discussed with the patient that there may be a patient responsible charge related to this service. The patient expressed understanding and agreed to proceed.   CC: back and hip pain  HPI ? 14 w Female with PMH of CAD (MI, stents, CABG), HTN, DM2 on insulin, HLP, CKD 2 2/2 uterine prolapse, smoke, depression who presents for back and hip pain  Last OV Nov 2019 with Dr Brigitte Pulse  Quit smoking in Dec 2019, recently had to resume chantix as she was noticing increased cravings, does not need refills at this time  Depression worse given current situation Having specially anhedonia Phq = 11, denies SI  Sees cardiology regularly  Having low back and right hip x several months, worsening Feels that recent weight gain is triggering Unloading dishwasher triggers, lower back middle Walking around her yard, 1 acre, causes both hips to hurt Last night woke her up, aching low back radiating to her right pain Using OTC pain patches which are not helping Pain does not radiate down her legs Denies any numbness or tingliing Denies any changes to bowel or bladder function Rest makes it better  Patients reports pain with hip flexion and internal rotation Denies any pain with extension and external rotaions Denies any TTP over trochanteric bursa or IT band  Patient concerned about her cbgs, avg 174   Allergies  Allergen Reactions  . Januvia [Sitagliptin] Nausea And Vomiting and Other (See  Comments)    Pancreatitis (this was in Epic, but patient was unaware of this)   . Metformin And Related Diarrhea  . Penicillins Other (See Comments)    Unknown childhood reaction Has patient had a PCN reaction causing immediate rash, facial/tongue/throat swelling, SOB or lightheadedness with hypotension: Unknown Has patient had a PCN reaction causing severe rash involving mucus membranes or skin necrosis: Unknown Has patient had a PCN reaction that required hospitalization: Unknown Has patient had a PCN reaction occurring within the last 10 years: No If all of the above answers are "NO", then may proceed with Cephalosporin use.     Prior to Admission medications   Medication Sig Start Date End Date Taking? Authorizing Provider  acetaminophen (TYLENOL) 500 MG tablet Take 1,000 mg by mouth every 6 (six) hours as needed for headache (pain).   Yes [provider]  aspirin 81 MG EC tablet Take 1 tablet (81 mg total) by mouth daily. Patient taking differently: Take 81 mg by mouth at bedtime.  02/11/18  Yes Duke, Tami Lin, PA  atorvastatin (LIPITOR) 80 MG tablet TAKE ONE TABLET BY MOUTH ONE TIME DAILY AT 6PM **NEED OFFICE VISIT WITH FASTING LABS FOR REFILLS** Patient taking differently: Take 80 mg by mouth daily. At 6pm 11/24/18  Yes Rutherford Guys, MD  buPROPion (WELLBUTRIN XL) 150 MG 24 hr tablet TAKE ONE TABLET BY MOUTH ONE TIME DAILY Patient taking differently: Take 150 mg by mouth daily.  10/28/18  Yes McVey, Gelene Mink, PA-C  CHANTIX 0.5 MG tablet TAKE ONE TABLET BY MOUTH TWICE  A DAY 08/11/18  Yes Shawnee Knapp, MD  clopidogrel (PLAVIX) 75 MG tablet Take 1 tablet (75 mg total) by mouth daily. 08/11/18  Yes Shawnee Knapp, MD  Continuous Blood Gluc Sensor (FREESTYLE LIBRE 14 DAY SENSOR) MISC 2 Devices by Does not apply route every morning. 11/09/18  Yes Shawnee Knapp, MD  EPINEPHrine (EPIPEN 2-PAK) 0.3 mg/0.3 mL IJ SOAJ injection Inject 0.3 mLs (0.3 mg total) into the muscle once as  needed. Patient taking differently: Inject 0.3 mg into the muscle once as needed (severe allergic reaction).  05/20/17  Yes Timmothy Euler, [place] D, PA-C  fluticasone (FLONASE) 50 MCG/ACT nasal spray Place 1 spray into both nostrils daily as needed for allergies or rhinitis. 08/03/18  Yes Valentina Shaggy, MD  Insulin Pen Needle (PEN NEEDLES) 32G X 4 MM MISC 1 Units by Does not apply route daily. 03/02/18  Yes Shawnee Knapp, MD  INVOKANA 300 MG TABS tablet TAKE ONE TABLET BY MOUTH EVERY MORNING BEFORE BREAKFAST *NEEDS APPOINTMENT FOR REFILLS* 12/29/18  Yes Rutherford Guys, MD  isosorbide mononitrate (IMDUR) 120 MG 24 hr tablet Take 1 tablet (120 mg total) by mouth daily. 11/08/18  Yes Hilty, Nadean Corwin, MD  LANTUS SOLOSTAR 100 UNIT/ML Solostar Pen INJECT 35 UNITS INTO THE SKIN DAILY AT 10PM Patient taking differently: Inject 35 Units into the skin at bedtime.  10/08/18  Yes Shawnee Knapp, MD  loratadine (CLARITIN) 10 MG tablet Take 1 tablet (10 mg total) by mouth daily. 05/01/17  Yes Timmothy Euler, [place] D, PA-C  metoprolol tartrate (LOPRESSOR) 50 MG tablet Take 1 tablet (50 mg total) by mouth 2 (two) times daily. 11/08/18  Yes Hilty, Nadean Corwin, MD  Multiple Vitamin (MULTIVITAMIN WITH MINERALS) TABS tablet Take 1 tablet by mouth daily.   Yes [provider]  NOVOLOG FLEXPEN 100 UNIT/ML FlexPen INJECT 10 UNITS UNDER SKIN PRIOR TO MEALS AND INJECT 5 UNITS UNDER SKIN FOR CBG >250 Patient taking differently: Inject 5-10 Units into the skin See admin instructions. Inject 10 units subcutaneously three times daily before meals, may also inject 5 units up to twice daily as needed for CBG >250 08/09/18  Yes Shawnee Knapp, MD  ranolazine (RANEXA) 500 MG 12 hr tablet Take 1 tablet (500 mg total) by mouth 2 (two) times daily. 12/31/18  Yes Hilty, Nadean Corwin, MD  sertraline (ZOLOFT) 50 MG tablet TAKE ONE TABLET BY MOUTH ONE TIME DAILY 12/25/18  Yes Shawnee Knapp, MD  TURMERIC PO Take 1 capsule by mouth daily.    Yes [provider]  varenicline (CHANTIX STARTING MONTH PAK) 0.5 MG X 11 & 1 MG X 42 tablet Take one 0.5 mg tab po qd x 3d, then increase to one 0.5 mg tab bid x 4 d, then increase to one 1 mg tab bid 04/26/18  Yes Shawnee Knapp, MD  amLODipine (NORVASC) 5 MG tablet Take 1 tablet (5 mg total) by mouth daily. 09/06/18 12/14/18  Pixie Casino, MD  lisinopril (ZESTRIL) 5 MG tablet Take 5 mg by mouth daily. 11/22/18   [provider]  nitroGLYCERIN (NITROSTAT) 0.4 MG SL tablet Place 1 tablet (0.4 mg total) under the tongue every 5 (five) minutes as needed for chest pain. 08/18/18 12/12/18  Almyra Deforest, PA  zolpidem (AMBIEN) 10 MG tablet Take 1 tablet (10 mg total) by mouth at bedtime as needed for sleep. **NEED OFFICE VISIT FOR ANY ADDITIONAL REFILLS** Patient not taking: Reported on 01/14/2019 05/05/18   Brigitte Pulse,  Laurey Arrow, MD    Past Medical History:  Diagnosis Date  . Anxiety   . Arthritis    "maybe in my left foot" (10/14/2017)  . Bladder prolapse, female, acquired 09/14/2017  . Depression   . High cholesterol   . History of stomach ulcers   . Hypertension   . Myocardial infarction ([place]) 12/30/2004  . Tobacco abuse   . Type II diabetes mellitus ([place])     Past Surgical History:  Procedure Laterality Date  . CARDIAC CATHETERIZATION  10/14/2017  . CORONARY ANGIOPLASTY WITH STENT PLACEMENT  12/30/2004   Patient reported  . CORONARY ARTERY BYPASS GRAFT N/A 10/19/2017   Procedure: CORONARY ARTERY BYPASS GRAFTING (CABG) times four using the right saphaneous vein. Harvested endoscopicly and left internal mammary artery.;  Surgeon: Grace Isaac, MD;  Location: [facility];  Service: Open Heart Surgery;  Laterality: N/A;  . DILATION AND CURETTAGE OF UTERUS  1980s  . LEFT HEART CATH AND CORONARY ANGIOGRAPHY N/A 10/14/2017   Procedure: LEFT HEART CATH AND CORONARY ANGIOGRAPHY;  Surgeon: Martinique, Peter M, MD;  Location: [facility];  Service: Cardiovascular;  Laterality: N/A;  . LEFT HEART CATH AND  CORS/GRAFTS ANGIOGRAPHY N/A 01/01/2018   Procedure: LEFT HEART CATH AND CORS/GRAFTS ANGIOGRAPHY;  Surgeon: Jettie Booze, MD;  Location: [facility];  Service: Cardiovascular;  Laterality: N/A;  . LEFT HEART CATH AND CORS/GRAFTS ANGIOGRAPHY N/A 08/16/2018   Procedure: LEFT HEART CATH AND CORS/GRAFTS ANGIOGRAPHY;  Surgeon: Nelva Bush, MD;  Location: [facility];  Service: Cardiovascular;  Laterality: N/A;  . PILONIDAL CYST EXCISION  1980s  . TEE WITHOUT CARDIOVERSION N/A 10/19/2017   Procedure: TRANSESOPHAGEAL ECHOCARDIOGRAM (TEE);  Surgeon: Grace Isaac, MD;  Location: [facility];  Service: Open Heart Surgery;  Laterality: N/A;    Social History   Tobacco Use  . Smoking status: Current Some Day Smoker    Packs/day: 0.12    Years: 45.00    Pack years: 5.40    Types: Cigarettes    Start date: 05/01/1972    Last attempt to quit: 09/30/2017    Years since quitting: 1.2  . Smokeless tobacco: Never Used  Substance Use Topics  . Alcohol use: Yes    Alcohol/week: 14.0 standard drinks    Types: 14 Glasses of wine per week    Comment: 2 glasses red wine at night    Family History  Problem Relation Age of Onset  . Heart disease Father   . Hyperlipidemia Father   . Hypertension Father     ROS Per hpi  Objective  Vitals as reported by the patient: none   ASSESSMENT and PLAN  1. Pain of right hip joint Progressing, per limited eval on phone, suggestive of OA. Discussed strengthening exercises, APAP with tramadol prn for severe pain. meds r/se/b reviewed.   2. Anxiety and depression Not well controlled, given anhedonia and smoking cravings, increasing wellbutrin.  - buPROPion (WELLBUTRIN XL) 300 MG 24 hr tablet; Take 1 tablet (300 mg total) by mouth daily.  3. Tobacco use disorder See above  4. Type 2 diabetes mellitus with hyperglycemia, with long-term current use of insulin ([place]) Ordering labs. I have asked that she downloads cbgs via mychart. Visit in  1 week to discuss further.  - Comprehensive metabolic panel; Future - Hemoglobin A1c; Future - Urinalysis, dipstick only; Future - Microalbumin/Creatinine Ratio, Urine; Future - TSH; Future - Lipid panel; Future  Other orders - lisinopril (ZESTRIL) 5 MG tablet; Take  5 mg by mouth daily. - traMADol (ULTRAM) 50 MG tablet; Take 1 tablet (50 mg total) by mouth every 6 (six) hours as needed for up to 5 days.  FOLLOW-UP: 1 week for diabetes with labs 2-3 days prior.    The above assessment and management plan was discussed with the patient. The patient verbalized understanding of and has agreed to the management plan. Patient is aware to call the clinic if symptoms persist or worsen. Patient is aware when to return to the clinic for a follow-up visit. Patient educated on when it is appropriate to go to the emergency department.    I provided 28 minutes of non-face-to-face time during this encounter.  Rutherford Guys, MD Primary Care at [facility], [place] [address] Ph.  [phone] Fax [phone]

## 2019-01-14 NOTE — Progress Notes (Signed)
Back and back are in pain. Woke up from hip pain the night before last. Unable to walk long distances and PHQ9=11

## 2019-02-08 ENCOUNTER — Other Ambulatory Visit: Payer: Self-pay | Admitting: Family Medicine

## 2019-02-11 ENCOUNTER — Telehealth: Payer: Self-pay

## 2019-02-11 NOTE — Telephone Encounter (Signed)
Faxed request from [facility] for reifll of Lisinopril 67m originally prescribed by Dr. SBrigitte Pulse  L/m for pt.  Need to confirm if she is still taking the Lisinopril.  What has BP been running at home?

## 2019-02-16 ENCOUNTER — Other Ambulatory Visit: Payer: Self-pay

## 2019-02-16 DIAGNOSIS — I10 Essential (primary) hypertension: Secondary | ICD-10-CM

## 2019-02-16 MED ORDER — LISINOPRIL 5 MG PO TABS: 5.0000 mg | ORAL_TABLET | Freq: Every day | ORAL | 2 refills | Status: DC

## 2019-02-16 NOTE — Telephone Encounter (Signed)
Pt c/b to office.  States she has been taking her Lisinopril.  Last home BP reading was "a few points under 120/80".  States she will be xfer care to Dr. Pamella Pert.  Offered to make appt now but pt states she has to go out of town tomorrow for family emergency.  After review of notes from recent visit with Dr. Pamella Pert, refilled Lisinopril.

## 2019-02-17 ENCOUNTER — Telehealth: Payer: Self-pay | Admitting: Emergency Medicine

## 2019-02-17 NOTE — Telephone Encounter (Signed)
Copied from [place] [id number]. Topic: Quick Communication - Rx Refill/Question >> Feb 17, 2019  1:42 PM Blase Mess A wrote: Medication: Insulin Pen Needle (PEN NEEDLES) 32G X 4 MM MISC [[id number]]   Has the patient contacted their pharmacy? Yes  (Agent: If no, request that the patient contact the pharmacy for the refill.) (Agent: If yes, when and what did the pharmacy advise?)  Preferred Pharmacy (with phone number or street name):  [facility] [address] [place], [place]. [phone] (Phone) [phone] (Fax)     Agent: Please be advised that RX refills may take up to 3 business days. We ask that you follow-up with your pharmacy.

## 2019-02-18 MED ORDER — PEN NEEDLES 32G X 4 MM MISC: 1.0000 [IU] | Freq: Every day | 0 refills | Status: DC

## 2019-02-18 NOTE — Telephone Encounter (Signed)
rx has been sent in however pt does need a f/u apt needed. Mailbox was full and could not leave any additional VM.

## 2019-02-28 ENCOUNTER — Telehealth: Payer: Self-pay | Admitting: Internal Medicine

## 2019-02-28 NOTE — Telephone Encounter (Signed)
Mychart, smartphone, verbal consent, pre reg complete 02/28/19 AF

## 2019-03-07 ENCOUNTER — Encounter: Payer: Self-pay | Admitting: Internal Medicine

## 2019-03-07 ENCOUNTER — Telehealth (INDEPENDENT_AMBULATORY_CARE_PROVIDER_SITE_OTHER): Payer: BC Managed Care – PPO | Admitting: Internal Medicine

## 2019-03-07 VITALS — BP 106/66 | HR 86 | Ht 66.0 in | Wt 200.0 lb

## 2019-03-07 DIAGNOSIS — I25708 Atherosclerosis of coronary artery bypass graft(s), unspecified, with other forms of angina pectoris: Secondary | ICD-10-CM

## 2019-03-07 DIAGNOSIS — Z794 Long term (current) use of insulin: Secondary | ICD-10-CM

## 2019-03-07 DIAGNOSIS — Z951 Presence of aortocoronary bypass graft: Secondary | ICD-10-CM | POA: Diagnosis not present

## 2019-03-07 DIAGNOSIS — I10 Essential (primary) hypertension: Secondary | ICD-10-CM

## 2019-03-07 DIAGNOSIS — E118 Type 2 diabetes mellitus with unspecified complications: Secondary | ICD-10-CM | POA: Diagnosis not present

## 2019-03-07 DIAGNOSIS — E785 Hyperlipidemia, unspecified: Secondary | ICD-10-CM

## 2019-03-07 NOTE — Patient Instructions (Addendum)
Medication Instructions:  Your physician recommends that you continue on your current medications as directed. Please refer to the Current Medication list given to you today.  If you need a refill on your cardiac medications before your next appointment, please call your pharmacy.   Lab work: FASTING labs - lipid panel, A1c, comprehensive metabolic panel   Follow-Up: At [facility], you and your health needs are our priority.  As part of our continuing mission to provide you with exceptional heart care, we have created designated Provider Care Teams.  These Care Teams include your primary Cardiologist (physician) and Advanced Practice Providers (APPs -  Physician Assistants and Nurse Practitioners) who all work together to provide you with the care you need, when you need it. You will need a follow up appointment in 12 months.  Please call our office 2 months in advance to schedule this appointment.  You may see Pixie Casino, MD or one of the following Advanced Practice Providers on your designated Care Team: [place], [place] . Fabian Sharp, PA-C  Any Other Special Instructions Will Be Listed Below (If Applicable).

## 2019-03-07 NOTE — Progress Notes (Signed)
Virtual Visit via Video Note   This visit type was conducted due to national recommendations for restrictions regarding the COVID-19 Pandemic (e.g. social distancing) in an effort to limit this patient's exposure and mitigate transmission in our community.  Due to her co-morbid illnesses, this patient is at least at moderate risk for complications without adequate follow up.  This format is felt to be most appropriate for this patient at this time.  All issues noted in this document were discussed and addressed.  A limited physical exam was performed with this format.  Please refer to the patient's chart for her consent to telehealth for [facility].   Evaluation Performed:  Doxy.me video visit  Date:  03/07/2019   ID:  Victoria Eaton, DOB [birth year], MRN [id number]  Patient Location:  [place] [id number]  Provider location:   [address], [place], [place] [address]  PCP:  Patient, No Pcp Per  Cardiologist:  Victoria Casino, MD Electrophysiologist:  None   Chief Complaint:  No complaints  History of Present Illness:    Victoria Eaton is a 58 y.o. female who presents via audio/video conferencing for a telehealth visit today.  [name] was seen today for routine follow-up.  Over the past year she is done well and fortunately is without any further chest pain.  She had fairly quick loss of bypass graft after CABG and underwent multiple cardiac catheterizations.  She is now maintained on antianginals as well as aspirin and Lasix.  She is not had any recent lab work since November which time her lipids were fairly well controlled.  She denies any palpitations or worsening shortness of breath.  The patient does not have symptoms concerning for COVID-19 infection (fever, chills, cough, or new SHORTNESS OF BREATH).    Prior CV studies:   The following studies were reviewed today:  Chart review  PMHx:  Past Medical History:  Diagnosis Date  .  Anxiety   . Arthritis    "maybe in my left foot" (10/14/2017)  . Bladder prolapse, female, acquired 09/14/2017  . Depression   . High cholesterol   . History of stomach ulcers   . Hypertension   . Myocardial infarction ([place]) 12/30/2004  . Tobacco abuse   . Type II diabetes mellitus ([place])     Past Surgical History:  Procedure Laterality Date  . CARDIAC CATHETERIZATION  10/14/2017  . CORONARY ANGIOPLASTY WITH STENT PLACEMENT  12/30/2004   Patient reported  . CORONARY ARTERY BYPASS GRAFT N/A 10/19/2017   Procedure: CORONARY ARTERY BYPASS GRAFTING (CABG) times four using the right saphaneous vein. Harvested endoscopicly and left internal mammary artery.;  Surgeon: Grace Isaac, MD;  Location: [place];  Service: Open Heart Surgery;  Laterality: N/A;  . DILATION AND CURETTAGE OF UTERUS  1980s  . LEFT HEART CATH AND CORONARY ANGIOGRAPHY N/A 10/14/2017   Procedure: LEFT HEART CATH AND CORONARY ANGIOGRAPHY;  Surgeon: Martinique, Peter M, MD;  Location: [place] [facility];  Service: Cardiovascular;  Laterality: N/A;  . LEFT HEART CATH AND CORS/GRAFTS ANGIOGRAPHY N/A 01/01/2018   Procedure: LEFT HEART CATH AND CORS/GRAFTS ANGIOGRAPHY;  Surgeon: Jettie Booze, MD;  Location: [place] [facility];  Service: Cardiovascular;  Laterality: N/A;  . LEFT HEART CATH AND CORS/GRAFTS ANGIOGRAPHY N/A 08/16/2018   Procedure: LEFT HEART CATH AND CORS/GRAFTS ANGIOGRAPHY;  Surgeon: Nelva Bush, MD;  Location: [place] [facility];  Service: Cardiovascular;  Laterality: N/A;  . PILONIDAL CYST EXCISION  1980s  .  TEE WITHOUT CARDIOVERSION N/A 10/19/2017   Procedure: TRANSESOPHAGEAL ECHOCARDIOGRAM (TEE);  Surgeon: Grace Isaac, MD;  Location: [place];  Service: Open Heart Surgery;  Laterality: N/A;    FAMHx:  Family History  Problem Relation Age of Onset  . Heart disease Father   . Hyperlipidemia Father   . Hypertension Father     SOCHx:   reports that she has been smoking cigarettes. She started  smoking about 46 years ago. She has a 5.40 pack-year smoking history. She has never used smokeless tobacco. She reports current alcohol use of about 14.0 standard drinks of alcohol per week. She reports that she does not use drugs.  ALLERGIES:  Allergies  Allergen Reactions  . Januvia [Sitagliptin] Nausea And Vomiting and Other (See Comments)    Pancreatitis (this was in Epic, but patient was unaware of this)   . Metformin And Related Diarrhea  . Penicillins Other (See Comments)    Unknown childhood reaction Has patient had a PCN reaction causing immediate rash, facial/tongue/throat swelling, SOB or lightheadedness with hypotension: Unknown Has patient had a PCN reaction causing severe rash involving mucus membranes or skin necrosis: Unknown Has patient had a PCN reaction that required hospitalization: Unknown Has patient had a PCN reaction occurring within the last 10 years: No If all of the above answers are "NO", then may proceed with Cephalosporin use.     MEDS:  Current Meds  Medication Sig  . acetaminophen (TYLENOL) 500 MG tablet Take 1,000 mg by mouth every 6 (six) hours as needed for headache (pain).  [name] amLODipine (NORVASC) 5 MG tablet Take 1 tablet (5 mg total) by mouth daily.  [name] aspirin 81 MG EC tablet Take 1 tablet (81 mg total) by mouth daily. (Patient taking differently: Take 325 mg by mouth daily. )  . atorvastatin (LIPITOR) 80 MG tablet TAKE ONE TABLET BY MOUTH ONE TIME DAILY AT 6PM *NEED OFFICE VISIT WITH FASTING LABS FOR REFILLS*  . buPROPion (WELLBUTRIN XL) 300 MG 24 hr tablet Take 1 tablet (300 mg total) by mouth daily.  . CHANTIX 0.5 MG tablet TAKE ONE TABLET BY MOUTH TWICE A DAY  . clopidogrel (PLAVIX) 75 MG tablet Take 1 tablet (75 mg total) by mouth daily.  . Continuous Blood Gluc Sensor (FREESTYLE LIBRE 14 DAY SENSOR) MISC 2 Devices by Does not apply route every morning.  [name] EPINEPHrine (EPIPEN 2-PAK) 0.3 mg/0.3 mL IJ SOAJ injection Inject 0.3 mLs (0.3 mg total)  into the muscle once as needed. (Patient taking differently: Inject 0.3 mg into the muscle once as needed (severe allergic reaction). )  . Insulin Pen Needle (PEN NEEDLES) 32G X 4 MM MISC 1 Units by Does not apply route daily.  . INVOKANA 300 MG TABS tablet TAKE ONE TABLET BY MOUTH EVERY MORNING BEFORE BREAKFAST *NEED APPOINTMENT FOR REFILLS*  . isosorbide mononitrate (IMDUR) 120 MG 24 hr tablet Take 1 tablet (120 mg total) by mouth daily.  [name] LANTUS SOLOSTAR 100 UNIT/ML Solostar Pen INJECT 35 UNITS INTO THE SKIN DAILY AT 10PM (Patient taking differently: Inject 35 Units into the skin at bedtime. )  . lisinopril (ZESTRIL) 5 MG tablet Take 1 tablet (5 mg total) by mouth daily.  [name] loratadine (CLARITIN) 10 MG tablet Take 1 tablet (10 mg total) by mouth daily.  . metoprolol tartrate (LOPRESSOR) 50 MG tablet Take 50 mg by mouth 2 (two) times daily.  . Multiple Vitamin (MULTIVITAMIN WITH MINERALS) TABS tablet Take 1 tablet by mouth daily.  . nitroGLYCERIN (NITROSTAT)  0.4 MG SL tablet Place 1 tablet (0.4 mg total) under the tongue every 5 (five) minutes as needed for chest pain.  [name] NOVOLOG FLEXPEN 100 UNIT/ML FlexPen INJECT 10 UNITS UNDER SKIN PRIOR TO MEALS AND INJECT 5 UNITS UNDER SKIN FOR CBG >250 (Patient taking differently: Inject 5-10 Units into the skin See admin instructions. Inject 10 units subcutaneously three times daily before meals, may also inject 5 units up to twice daily as needed for CBG >250)  . Omega-3 Fatty Acids (FISH OIL PO) Take by mouth.  . ranolazine (RANEXA) 500 MG 12 hr tablet Take 1 tablet (500 mg total) by mouth 2 (two) times daily.  . sertraline (ZOLOFT) 50 MG tablet TAKE ONE TABLET BY MOUTH ONE TIME DAILY  . traMADol (ULTRAM) 50 MG tablet Take by mouth every 6 (six) hours as needed.  . TURMERIC PO Take 1 capsule by mouth daily.   . [DISCONTINUED] metoprolol tartrate (LOPRESSOR) 50 MG tablet Take 1 tablet (50 mg total) by mouth 2 (two) times daily.     ROS: Pertinent items  noted in HPI and remainder of comprehensive ROS otherwise negative.  Labs/Other Tests and Data Reviewed:    Recent Labs: 08/15/2018: B Natriuretic Peptide 55.4 12/14/2018: ALT 19; BUN 18; Creatinine, Ser 1.09; Hemoglobin 10.9; Platelets 227; Potassium 3.9; Sodium 134   Recent Lipid Panel Lab Results  Component Value Date/Time   CHOL 161 08/16/2018 03:41 AM   CHOL 163 08/03/2018 03:53 PM   TRIG 162 (H) 08/16/2018 03:41 AM   HDL 59 08/16/2018 03:41 AM   HDL 67 08/03/2018 03:53 PM   CHOLHDL 2.7 08/16/2018 03:41 AM   LDLCALC 70 08/16/2018 03:41 AM   LDLCALC 66 08/03/2018 03:53 PM    Wt Readings from Last 3 Encounters:  03/07/19 200 lb (90.7 kg)  12/14/18 200 lb 7 oz (90.9 kg)  12/12/18 200 lb (90.7 kg)     Exam:    Vital Signs:  BP 106/66   Pulse 86   Ht 5' 6"  (1.676 m)   Wt 200 lb (90.7 kg)   BMI 32.28 kg/m    General appearance: alert and no distress Lungs: no visual respiratory difficulty Abdomen: mildly obese Skin: Skin color, texture, turgor normal. No rashes or lesions Neurologic: Mental status: Alert, oriented, thought content appropriate Psych: Pleasant  ASSESSMENT & PLAN:    1. CAD status post CABG x 4 in 09/2017 (LIMA to LAD (patent), SVG to RCA (patent), SVG-D1 (CTO) and SVG-OM1 (CTO) as of cath 07/2018 2. HTN 3. HLD 4. DM2 - controlled  Mrs. Shipman is doing well after CABG in 2019 with no further chest pain or worsening shortness of breath.  Blood pressures well controlled.  Her cholesterol has been at goal but she is due for repeat lab work as well as to reassess diabetes which had been controlled as well.  Plan follow-up with me annually or sooner as necessary.  I will reach out to her with the results of her lab work when it is available.  COVID-19 Education: The signs and symptoms of COVID-19 were discussed with the patient and how to seek care for testing (follow up with PCP or arrange E-visit).  The importance of social distancing was discussed  today.  Patient Risk:   After full review of this patients clinical status, I feel that they are at least moderate risk at this time.  Time:   Today, I have spent 25 minutes with the patient with telehealth technology discussing coronary artery disease,  hypertension, hyperlipidemia and type 2 diabetes..     Medication Adjustments/Labs and Tests Ordered: Current medicines are reviewed at length with the patient today.  Concerns regarding medicines are outlined above.   Tests Ordered: Orders Placed This Encounter  Procedures  . Lipid panel  . HgB A1c  . Comprehensive metabolic panel    Medication Changes: No orders of the defined types were placed in this encounter.   Disposition:  in 1 year(s)  Victoria Casino, MD, [facility], [place] Director of the Advanced Lipid Disorders &  Cardiovascular Risk Reduction Clinic Diplomate of the American Board of Clinical Lipidology Attending Cardiologist  Direct Dial: [phone]  Fax: [phone]  Website:  [email or web address]  Victoria Casino, MD  03/07/2019 2:30 PM

## 2019-03-15 ENCOUNTER — Other Ambulatory Visit: Payer: Self-pay | Admitting: Family Medicine

## 2019-03-15 NOTE — Telephone Encounter (Signed)
Former Pharmacist, community pt, please advise

## 2019-03-21 ENCOUNTER — Other Ambulatory Visit: Payer: Self-pay

## 2019-03-21 MED ORDER — METOPROLOL TARTRATE 50 MG PO TABS: 50.0000 mg | ORAL_TABLET | Freq: Two times a day (BID) | ORAL | 11 refills | Status: DC

## 2019-03-23 ENCOUNTER — Telehealth: Payer: Self-pay | Admitting: General Practice

## 2019-03-23 NOTE — Telephone Encounter (Signed)
Medication Refill - Medication: LANTUS SOLOSTAR 100 UNIT/ML Solostar Pen   Has the patient contacted their pharmacy? Yes.  Pharmacy called stating they had been trying to get in contact for a wk. Please advise.  (Agent: If no, request that the patient contact the pharmacy for the refill.) (Agent: If yes, when and what did the pharmacy advise?)  Preferred Pharmacy (with phone number or street name):  [facility] [address] [place], [place].  [place]. [place] [address]  Phone: [phone] Fax: [phone]  Not a 24 hour pharmacy; exact hours not known.     Agent: Please be advised that RX refills may take up to 3 business days. We ask that you follow-up with your pharmacy.

## 2019-03-25 ENCOUNTER — Other Ambulatory Visit: Payer: Self-pay | Admitting: Family Medicine

## 2019-03-25 NOTE — Telephone Encounter (Signed)
Forwarding medication refill to provider for review. 

## 2019-03-26 NOTE — Telephone Encounter (Signed)
This patient needs to establish herself with a new PCP in the office. I'm very unfamiliar with her. Needs OV.

## 2019-04-04 ENCOUNTER — Other Ambulatory Visit: Payer: Self-pay | Admitting: Family Medicine

## 2019-04-05 ENCOUNTER — Other Ambulatory Visit: Payer: Self-pay | Admitting: *Deleted

## 2019-04-05 MED ORDER — ISOSORBIDE MONONITRATE ER 120 MG PO TB24: 120.0000 mg | ORAL_TABLET | Freq: Every day | ORAL | 3 refills | Status: DC

## 2019-04-08 ENCOUNTER — Other Ambulatory Visit: Payer: Self-pay | Admitting: Family Medicine

## 2019-04-08 NOTE — Telephone Encounter (Signed)
Medication Refill - Medication: CHANTIX 0.5 MG tablet/ Pharmacy called to request refill for chantix  Has the patient contacted their pharmacy? Yes.   (Agent: If no, request that the patient contact the pharmacy for the refill.) (Agent: If yes, when and what did the pharmacy advise?)  Preferred Pharmacy (with phone number or street name):  [facility] [address] [place], [place]. [phone] (Phone) [phone] (Fax)     Agent: Please be advised that RX refills may take up to 3 business days. We ask that you follow-up with your pharmacy.

## 2019-04-09 NOTE — Telephone Encounter (Signed)
Lantus solostar 30 mL with 1 refill sent on 03/26/2019. Dgaddy, CMA

## 2019-04-13 MED ORDER — VARENICLINE TARTRATE 0.5 MG PO TABS: 0.5000 mg | ORAL_TABLET | Freq: Two times a day (BID) | ORAL | 3 refills | Status: DC

## 2019-04-14 ENCOUNTER — Other Ambulatory Visit: Payer: Self-pay | Admitting: Family Medicine

## 2019-04-14 ENCOUNTER — Other Ambulatory Visit: Payer: Self-pay | Admitting: Physician Assistant

## 2019-04-14 DIAGNOSIS — F329 Major depressive disorder, single episode, unspecified: Secondary | ICD-10-CM

## 2019-04-14 DIAGNOSIS — F32A Depression, unspecified: Secondary | ICD-10-CM

## 2019-04-14 NOTE — Telephone Encounter (Signed)
Requested medications are due for refill today?  Yes  Requested medications are on the active medication list?  Yes  Last refill 01/29/2019  Future visit scheduled?  No  Notes to clinic - Patient was advised with last refill that she would need appointment before addition refills would be given, no appointment has been scheduled.  Requested Prescriptions  Pending Prescriptions Disp Refills   atorvastatin (LIPITOR) 80 MG tablet [Pharmacy Med Name: ATORVASTATIN 80 MG TAB[*]] 90 tablet 0    Sig: TAKE ONE TABLET BY MOUTH ONE TIME DAILY AT 6PM *NEED APPOINTMENT WITH FASTING LABS FOR REFILLS*     Cardiovascular:  Antilipid - Statins Failed - 04/14/2019  2:54 PM      Failed - Triglycerides in normal range and within 360 days    Triglycerides  Date Value Ref Range Status  08/16/2018 162 (H) <150 mg/dL Final         Passed - Total Cholesterol in normal range and within 360 days    Cholesterol, Total  Date Value Ref Range Status  08/03/2018 163 100 - 199 mg/dL Final   Cholesterol  Date Value Ref Range Status  08/16/2018 161 0 - 200 mg/dL Final         Passed - LDL in normal range and within 360 days    LDL Calculated  Date Value Ref Range Status  08/03/2018 66 0 - 99 mg/dL Final   LDL Cholesterol  Date Value Ref Range Status  08/16/2018 70 0 - 99 mg/dL Final    Comment:           Total Cholesterol/HDL:CHD Risk Coronary Heart Disease Risk Table                     Men   Women  1/2 Average Risk   3.4   3.3  Average Risk       5.0   4.4  2 X Average Risk   9.6   7.1  3 X Average Risk  23.4   11.0        Use the calculated Patient Ratio above and the CHD Risk Table to determine the patient's CHD Risk.        ATP III CLASSIFICATION (LDL):  <100     mg/dL   Optimal  100-129  mg/dL   Near or Above                    Optimal  130-159  mg/dL   Borderline  160-189  mg/dL   High  >190     mg/dL   Very High Performed at [facility] [address], [place], [place]  [address]          Passed - HDL in normal range and within 360 days    HDL  Date Value Ref Range Status  08/16/2018 59 >40 mg/dL Final  08/03/2018 67 >39 mg/dL Final         Passed - Patient is not pregnant      Passed - Valid encounter within last 12 months    Recent Outpatient Visits          3 months ago Pain of right hip joint   [facility], Lilia Argue, MD   8 months ago Essential hypertension   Primary Care at [facility] [place], PA-C   1 year ago Acute gout involving toe of right foot, unspecified cause   [facility] at  [facility], Gelene Mink, PA-C   1 year ago Type 2 diabetes mellitus with complication, with long-term current use of insulin [facility])   [facility], Laurey Arrow, MD   1 year ago Type 2 diabetes mellitus with hyperglycemia, without long-term current use of insulin [facility])   [facility] at [facility], Laurey Arrow, MD              Called patient and left a voicemail for her to call back to schedule a follow up appointment.

## 2019-04-14 NOTE — Telephone Encounter (Signed)
Forwarding medication refill to PCP for review. 

## 2019-04-14 NOTE — Telephone Encounter (Signed)
Requested Prescriptions  Pending Prescriptions Disp Refills  . sertraline (ZOLOFT) 50 MG tablet [Pharmacy Med Name: SERTRALINE 50 MG TAB[*]] 90 tablet 0    Sig: TAKE ONE TABLET BY MOUTH ONE TIME DAILY     Psychiatry:  Antidepressants - SSRI Passed - 04/14/2019  3:23 PM      Passed - Valid encounter within last 6 months    Recent Outpatient Visits          3 months ago Pain of right hip joint   [facility], Lilia Argue, MD   8 months ago Essential hypertension   Primary Care at [facility], Gelene Mink, PA-C   1 year ago Acute gout involving toe of right foot, unspecified cause   Primary Care at [facility], Gelene Mink, PA-C   1 year ago Type 2 diabetes mellitus with complication, with long-term current use of insulin [facility])   [facility] at [facility], Laurey Arrow, MD   1 year ago Type 2 diabetes mellitus with hyperglycemia, without long-term current use of insulin [facility])   [facility] at [facility], Laurey Arrow, MD             Passed - Completed PHQ-2 or PHQ-9 in the last 360 days.

## 2019-04-14 NOTE — Telephone Encounter (Signed)
Please schedule patient to establish care as patient's previous pcp in longer with this practice. thanks 

## 2019-04-18 ENCOUNTER — Other Ambulatory Visit: Payer: Self-pay | Admitting: Family Medicine

## 2019-04-18 NOTE — Telephone Encounter (Signed)
Requested medications are due for refill today?  Yes  Requested medications are on the active medication list?  Yes  Last refill 11/09/2018  Future visit scheduled?  No  Notes to clinic -   Requested Prescriptions  Pending Prescriptions Disp Refills   Continuous Blood Gluc Sensor (FREESTYLE LIBRE 14 DAY SENSOR) MISC [Pharmacy Med Name: FREESTYLE LIBRE 14 DAY SENSOR] 2 each 0    Sig: Apply one sensor to the back or upper arm.  Replace every 14 days.  Please schedule appointment before further refills are due.     There is no refill protocol information for this order

## 2019-04-26 ENCOUNTER — Other Ambulatory Visit: Payer: Self-pay | Admitting: Family Medicine

## 2019-04-26 NOTE — Telephone Encounter (Signed)
Requested medication (s) are due for refill today:   Yes  Requested medication (s) are on the active medication list:   Yes  Future visit scheduled:   No   Needs OV for further refills per note   Last ordered: 04/15/2019  #30  0 refills    Has had 30 day courtesy refill.   Requested Prescriptions  Pending Prescriptions Disp Refills   atorvastatin (LIPITOR) 80 MG tablet [Pharmacy Med Name: ATORVASTATIN 80 MG TAB[*]] 90 tablet 0    Sig: TAKE ONE TABLET BY MOUTH ONE TIME DAILY AT 6PM *NEED APPOINTMENT WITH FASTING LABS FOR REFILLS*     Cardiovascular:  Antilipid - Statins Failed - 04/26/2019  6:17 AM      Failed - Triglycerides in normal range and within 360 days    Triglycerides  Date Value Ref Range Status  08/16/2018 162 (H) <150 mg/dL Final         Passed - Total Cholesterol in normal range and within 360 days    Cholesterol, Total  Date Value Ref Range Status  08/03/2018 163 100 - 199 mg/dL Final   Cholesterol  Date Value Ref Range Status  08/16/2018 161 0 - 200 mg/dL Final         Passed - LDL in normal range and within 360 days    LDL Calculated  Date Value Ref Range Status  08/03/2018 66 0 - 99 mg/dL Final   LDL Cholesterol  Date Value Ref Range Status  08/16/2018 70 0 - 99 mg/dL Final    Comment:           Total Cholesterol/HDL:CHD Risk Coronary Heart Disease Risk Table                     Men   Women  1/2 Average Risk   3.4   3.3  Average Risk       5.0   4.4  2 X Average Risk   9.6   7.1  3 X Average Risk  23.4   11.0        Use the calculated Patient Ratio above and the CHD Risk Table to determine the patient's CHD Risk.        ATP III CLASSIFICATION (LDL):  <100     mg/dL   Optimal  100-129  mg/dL   Near or Above                    Optimal  130-159  mg/dL   Borderline  160-189  mg/dL   High  >190     mg/dL   Very High Performed at [facility] [address], , [place] [address]          Passed - HDL in normal range and within 360  days    HDL  Date Value Ref Range Status  08/16/2018 59 >40 mg/dL Final  08/03/2018 67 >39 mg/dL Final         Passed - Patient is not pregnant      Passed - Valid encounter within last 12 months    Recent Outpatient Visits          3 months ago Pain of right hip joint   [facility], Lilia Argue, MD   8 months ago Essential hypertension   Primary Care at [facility] [place], PA-C   1 year ago Acute gout involving toe of right foot, unspecified cause  Primary Care at [facility], Gelene Mink, PA-C   1 year ago Type 2 diabetes mellitus with complication, with long-term current use of insulin [facility])   [facility] at [facility], Laurey Arrow, MD   1 year ago Type 2 diabetes mellitus with hyperglycemia, without long-term current use of insulin [facility])   [facility] at [facility], Laurey Arrow, MD

## 2019-04-30 IMAGING — CR DG CHEST 2V
2 series · 2 of 2 positions shown · non-contrast
Comparison: None.

CLINICAL DATA: 56-year-old female with fever and cough.

EXAM:
CHEST  2 VIEW

[chest pa]
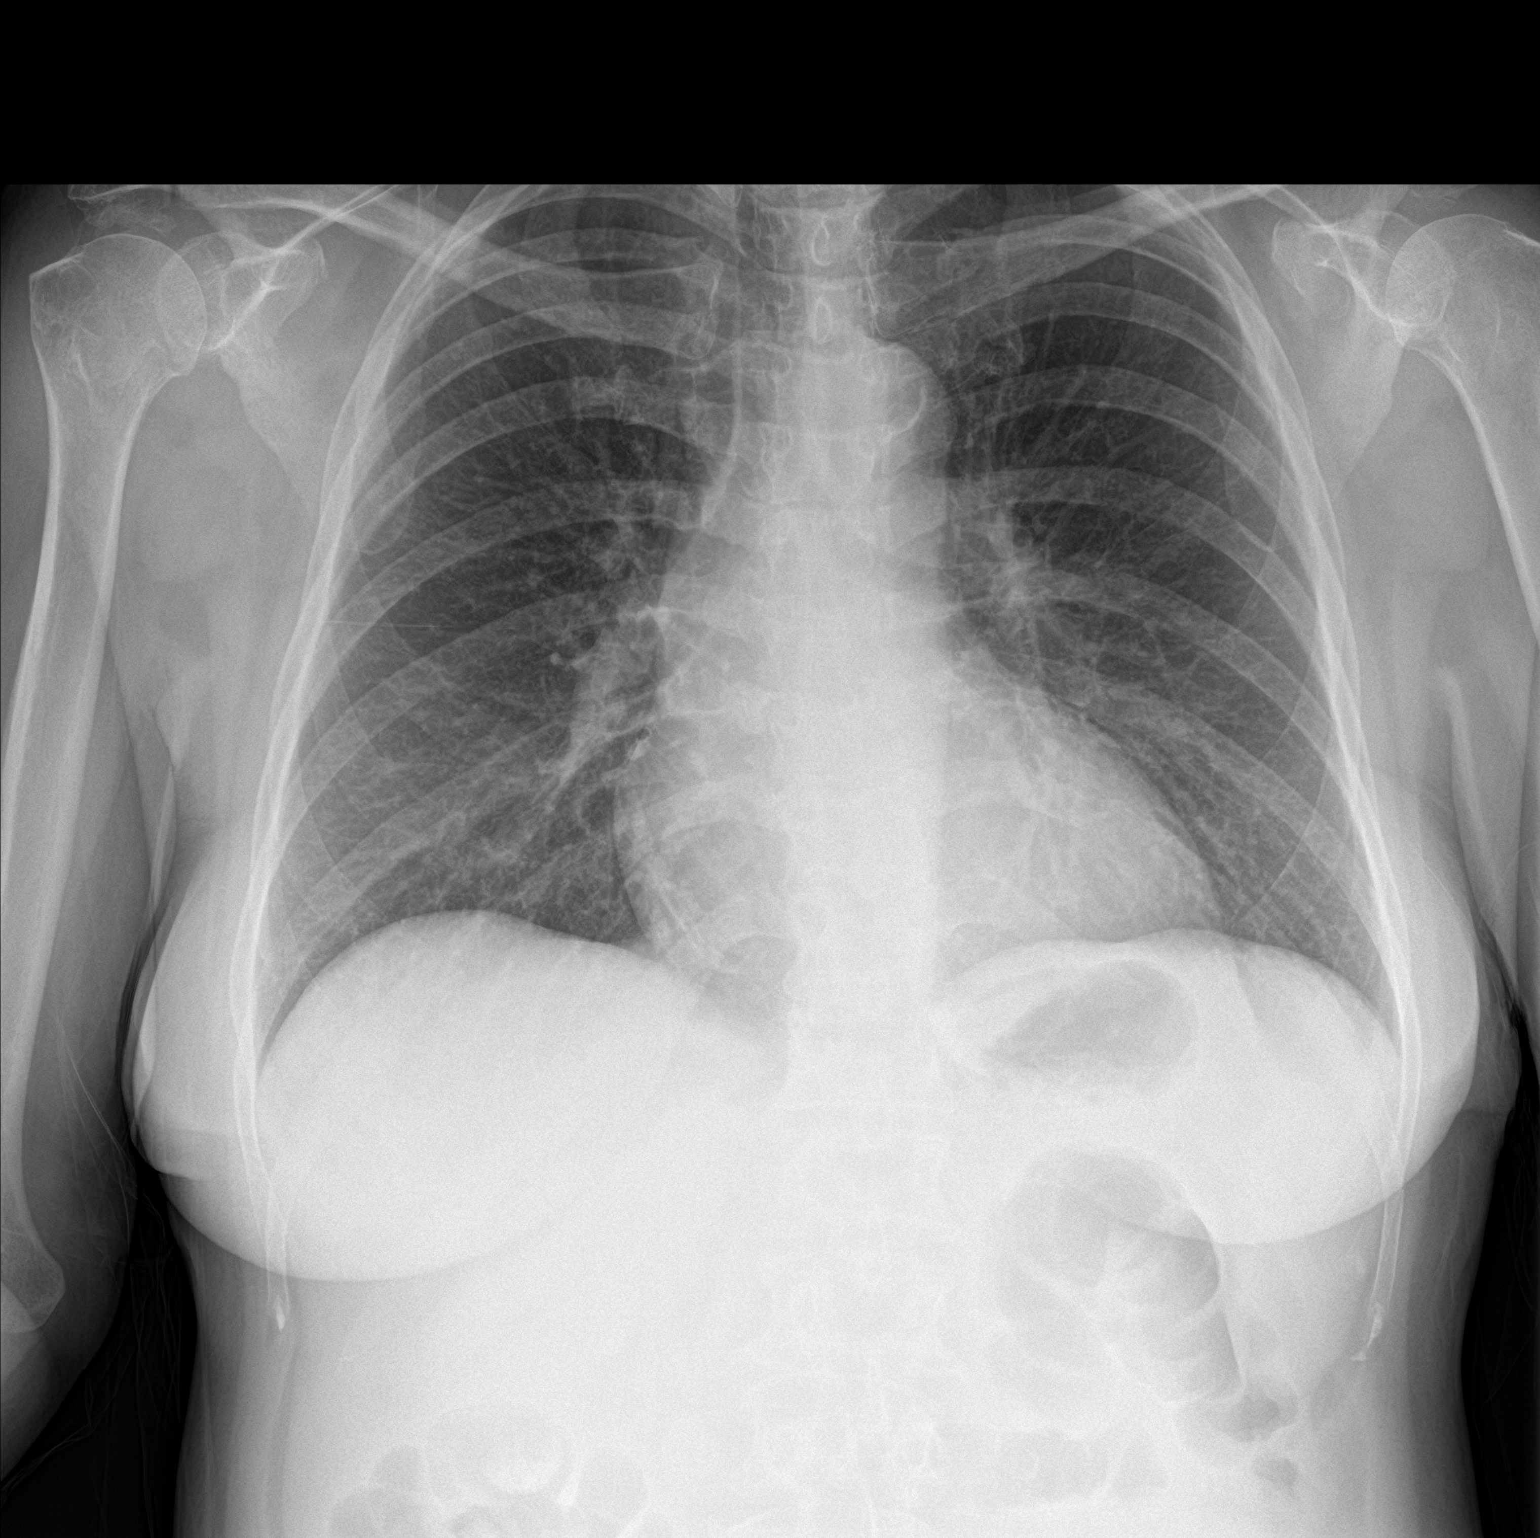

[chest lat]
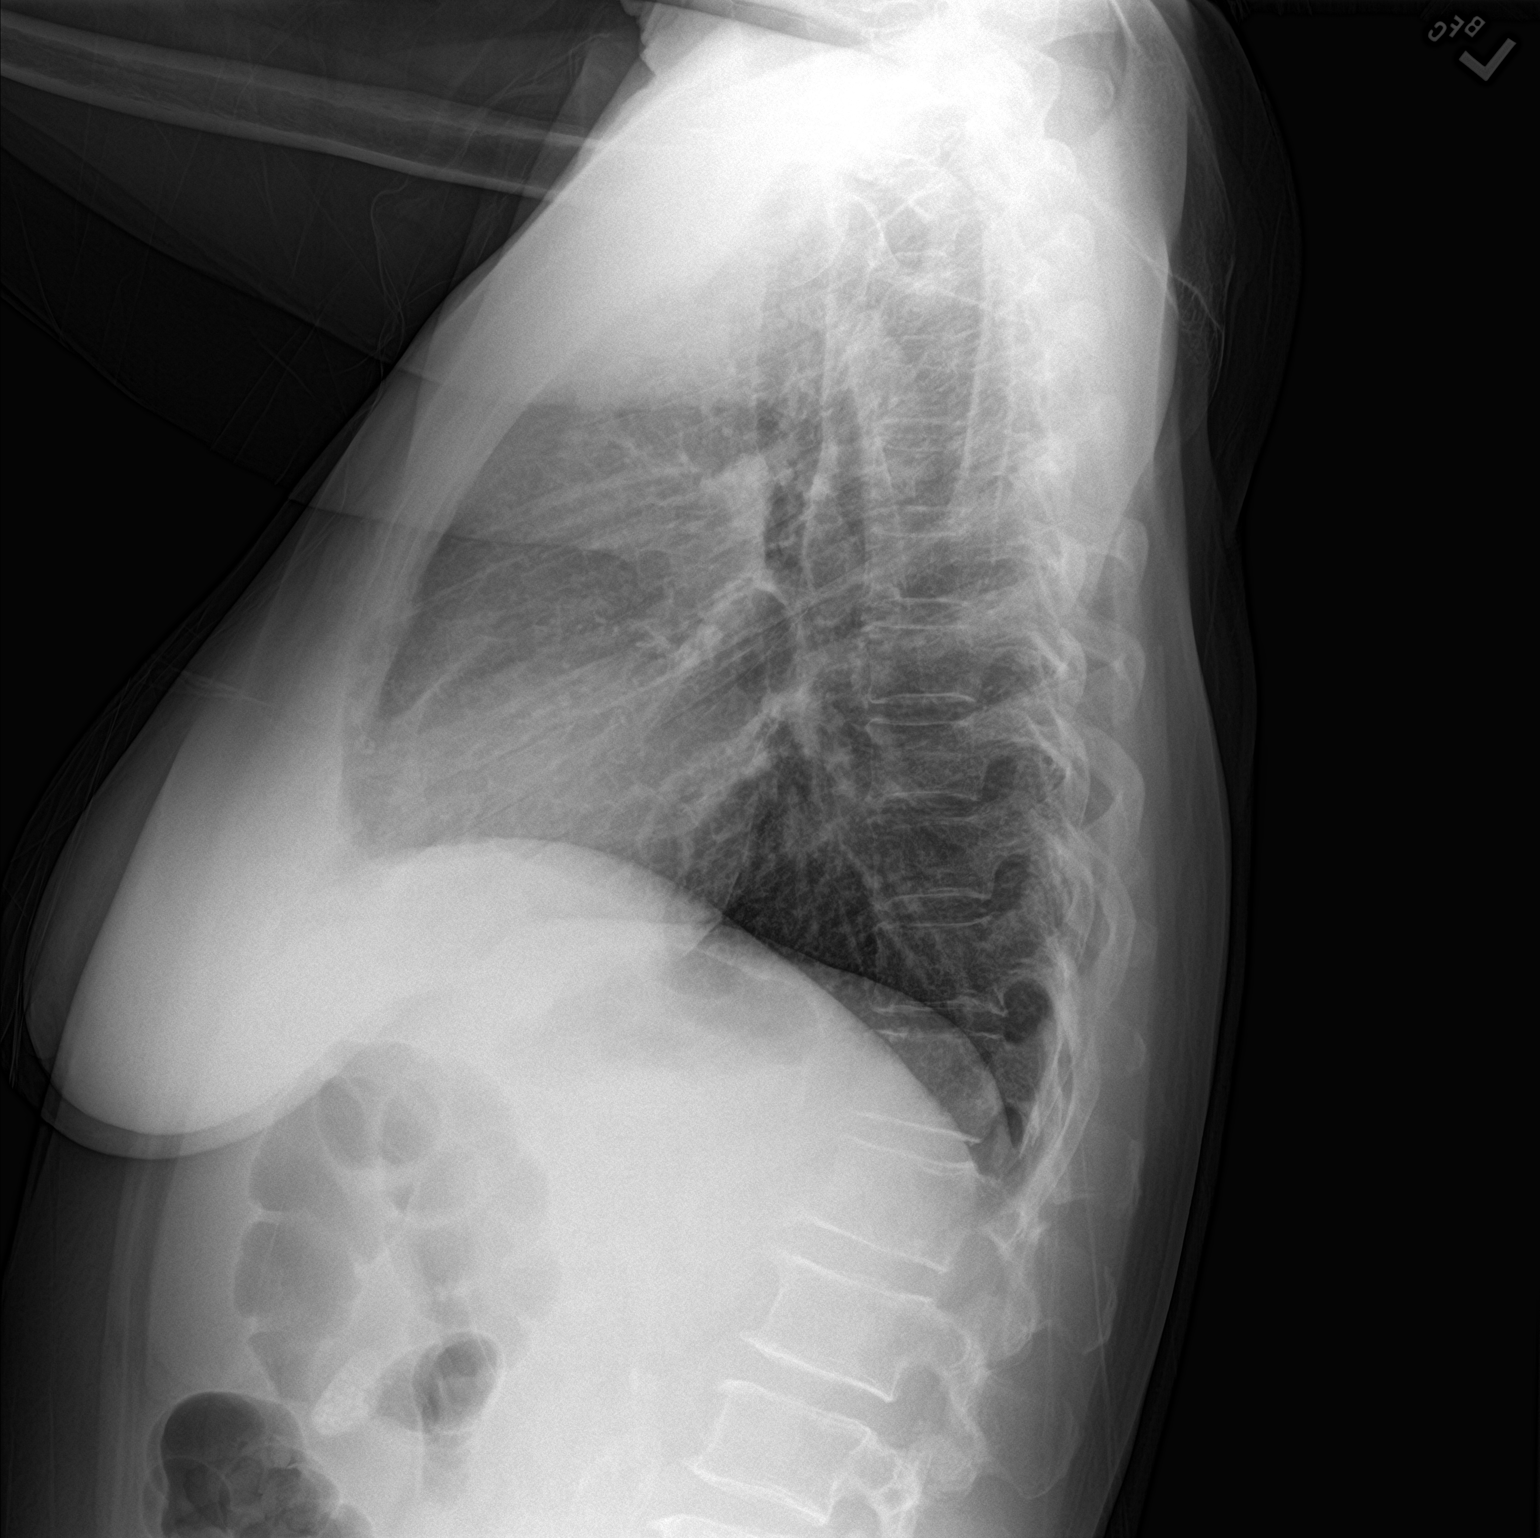

[2 of 2 positions shown; findings below may reference images not displayed]

FINDINGS: The lungs are clear. There is no pleural effusion or pneumothorax.
The cardiac silhouette is within normal limits. Mild atherosclerotic
calcification of the aortic arch.

High attenuating content in the right upper abdomen most likely
gallstones

Atherosclerotic calcification of the aorta.
IMPRESSION: 1. No acute cardiopulmonary process.
2. Probable gallstones.

## 2019-05-08 IMAGING — DX DG CHEST 1V PORT
1 series · 1 of 1 positions shown · non-contrast
Comparison: 10/02/2017

CLINICAL DATA: Chest pain/code stemi

EXAM:
PORTABLE CHEST 1 VIEW

[chest]
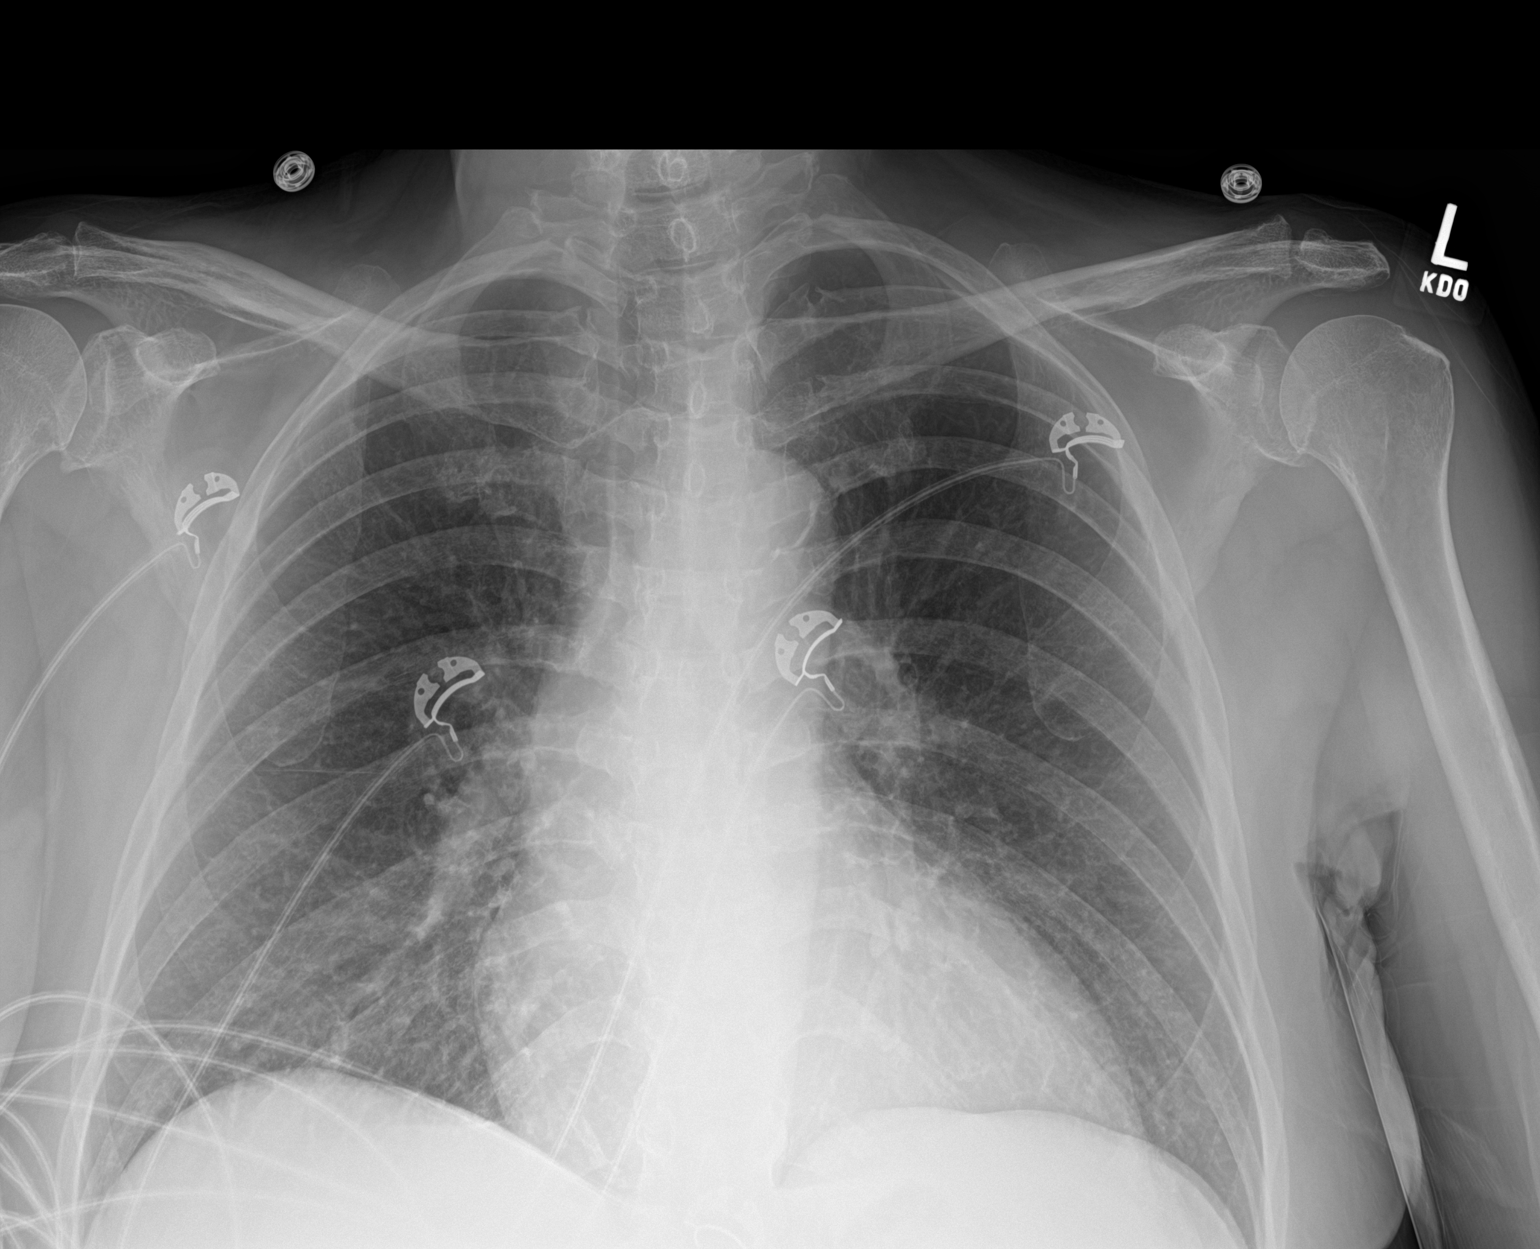

[1 of 1 positions shown; findings below may reference images not displayed]

FINDINGS: Normal mediastinum and cardiac silhouette. Normal pulmonary
vasculature. No evidence of effusion, infiltrate, or pneumothorax.
No acute bony abnormality.
IMPRESSION: No acute cardiopulmonary process.

## 2019-05-14 ENCOUNTER — Other Ambulatory Visit: Payer: Self-pay | Admitting: Family Medicine

## 2019-05-14 NOTE — Telephone Encounter (Signed)
Forwarding medication refill request to the clinical pool for review. 

## 2019-05-23 ENCOUNTER — Ambulatory Visit (INDEPENDENT_AMBULATORY_CARE_PROVIDER_SITE_OTHER): Payer: Self-pay | Admitting: Family Medicine

## 2019-05-23 ENCOUNTER — Encounter: Payer: Self-pay | Admitting: Family Medicine

## 2019-05-23 ENCOUNTER — Ambulatory Visit (INDEPENDENT_AMBULATORY_CARE_PROVIDER_SITE_OTHER): Payer: Medicaid Other

## 2019-05-23 ENCOUNTER — Other Ambulatory Visit: Payer: Self-pay

## 2019-05-23 VITALS — BP 114/70 | HR 72 | Temp 98.8°F | Resp 16 | Ht 66.0 in | Wt 211.0 lb

## 2019-05-23 DIAGNOSIS — I10 Essential (primary) hypertension: Secondary | ICD-10-CM

## 2019-05-23 DIAGNOSIS — M25552 Pain in left hip: Secondary | ICD-10-CM

## 2019-05-23 DIAGNOSIS — E1165 Type 2 diabetes mellitus with hyperglycemia: Secondary | ICD-10-CM

## 2019-05-23 DIAGNOSIS — Z1231 Encounter for screening mammogram for malignant neoplasm of breast: Secondary | ICD-10-CM

## 2019-05-23 DIAGNOSIS — Z794 Long term (current) use of insulin: Secondary | ICD-10-CM

## 2019-05-23 DIAGNOSIS — M25551 Pain in right hip: Secondary | ICD-10-CM

## 2019-05-23 LAB — LIPID PANEL
Chol/HDL Ratio: 3 ratio (ref 0.0–4.4)
Cholesterol, Total: 175 mg/dL (ref 100–199)
HDL: 59 mg/dL (ref >39)
LDL Chol Calc (NIH): 84 mg/dL (ref 0–99)
Triglycerides: 189 mg/dL — ABNORMAL HIGH (ref 0–149)
VLDL Cholesterol Cal: 32 mg/dL (ref 5–40)

## 2019-05-23 MED ORDER — CANAGLIFLOZIN 300 MG PO TABS: ORAL_TABLET | ORAL | 1 refills | Status: DC

## 2019-05-23 MED ORDER — TOUJEO MAX SOLOSTAR 300 UNIT/ML ~~LOC~~ SOPN: 70.0000 [IU] | PEN_INJECTOR | Freq: Every day | SUBCUTANEOUS | 1 refills | Status: DC

## 2019-05-23 MED ORDER — DICLOFENAC SODIUM 1 % TD GEL: 4.0000 g | Freq: Four times a day (QID) | TRANSDERMAL | 3 refills | Status: DC

## 2019-05-23 MED ORDER — FREESTYLE LIBRE 14 DAY SENSOR MISC: 2.0000 | Freq: Every morning | 5 refills | Status: DC

## 2019-05-23 NOTE — Patient Instructions (Addendum)
Titrate lantus/tojeo by 2 units every 3 days, fasting goal 90-130 without nocturnal hypoglycemia    If you have lab work done today you will be contacted with your lab results within the next 2 weeks.  If you have not heard from [place] then please contact us. The fastest way to get your results is to register for My Chart.   IF you received an x-ray today, you will receive an invoice from [facility]. Please contact [facility] at [phone] with questions or concerns regarding your invoice.   IF you received labwork today, you will receive an invoice from [facility]. Please contact [facility] at [phone] with questions or concerns regarding your invoice.   Our billing staff will not be able to assist you with questions regarding bills from these companies.  You will be contacted with the lab results as soon as they are available. The fastest way to get your results is to activate your My Chart account. Instructions are located on the last page of this paperwork. If you have not heard from [place] regarding the results in 2 weeks, please contact this office.     ]

## 2019-05-23 NOTE — Progress Notes (Signed)
8/31/202010:58 AM  Victoria Eaton [birth year], 58 y.o., female [id number]  Chief Complaint  Patient presents with  . Chronic Condtions    6 month follow-up  . Joint Pain    pt states she has been having joiny pain in her hip,fingers and knees     HPI:   Patient is a 58 y.o. female with past medical history significant for CAD (MI, stents, CABG), HTN, DM2 on insulin, HLP, CKD 2 2/2 uterine prolapse, smoke, depression who presents today for routine followup  Last OV April 2020 - telemedicine Increased wellutrin - working well Tramadol prn hip pain - not helping  Uses cgm, 7 day avg 233, fastings similar lantus 35 units at bedtime Takes invokana  Takes novolog 15 units AC Dose not see endo  Saw cards in July 2020 - telemedicine   Having bilateral hip, constant, walking distance makes it worse Low back pain as well, but not as much as her hips Using lidocaine patches and tramadol not helping No numbness, tingling or burning     Lab Results  Component Value Date   HGBA1C 7.8 (H) 08/03/2018   HGBA1C 8.6 12/07/2017   HGBA1C 8.5 (H) 10/18/2017   Lab Results  Component Value Date   LDLCALC 70 08/16/2018   CREATININE 1.09 (H) 12/14/2018    Depression screen PHQ 2/9 05/23/2019 01/14/2019 08/03/2018  Decreased Interest 0 1 3  Down, Depressed, Hopeless 0 1 3  PHQ - 2 Score 0 2 6  Altered sleeping - 3 2  Tired, decreased energy - 3 3  Change in appetite - 0 0  Feeling bad or failure about yourself  - 1 1  Trouble concentrating - 2 2  Moving slowly or fidgety/restless - 0 2  Suicidal thoughts - 0 0  PHQ-9 Score - 11 16  Difficult doing work/chores - Somewhat difficult -  Some recent data might be hidden    Fall Risk  05/23/2019 01/14/2019 08/03/2018 04/22/2018 03/09/2018  Falls in the past year? 0 1 1 Yes No  Number falls in past yr: - 0 0 1 -  Injury with Fall? 0 0 1 Yes -  Comment - - fractured arm  injured L elbow -  Follow up - - - - -     Allergies  Allergen  Reactions  . Januvia [Sitagliptin] Nausea And Vomiting and Other (See Comments)    Pancreatitis (this was in Epic, but patient was unaware of this)   . Metformin And Related Diarrhea  . Penicillins Other (See Comments)    Unknown childhood reaction Has patient had a PCN reaction causing immediate rash, facial/tongue/throat swelling, SOB or lightheadedness with hypotension: Unknown Has patient had a PCN reaction causing severe rash involving mucus membranes or skin necrosis: Unknown Has patient had a PCN reaction that required hospitalization: Unknown Has patient had a PCN reaction occurring within the last 10 years: No If all of the above answers are "NO", then may proceed with Cephalosporin use.     Prior to Admission medications   Medication Sig Start Date End Date Taking? Authorizing Provider  acetaminophen (TYLENOL) 500 MG tablet Take 1,000 mg by mouth every 6 (six) hours as needed for headache (pain).   Yes [provider]  aspirin 81 MG EC tablet Take 1 tablet (81 mg total) by mouth daily. Patient taking differently: Take 325 mg by mouth daily.  02/11/18  Yes Duke, Tami Lin, PA  atorvastatin (LIPITOR) 80 MG tablet TAKE ONE TABLET BY MOUTH ONE TIME  DAILY AT 6PM *NEED APPOINTMENT WITH FASTING LABS FOR REFILLS* 04/26/19  Yes Rutherford Guys, MD  buPROPion (WELLBUTRIN XL) 300 MG 24 hr tablet Take 1 tablet (300 mg total) by mouth daily. 01/14/19  Yes Rutherford Guys, MD  clopidogrel (PLAVIX) 75 MG tablet Take 1 tablet (75 mg total) by mouth daily. 08/11/18  Yes Shawnee Knapp, MD  Continuous Blood Gluc Sensor (FREESTYLE LIBRE 14 DAY SENSOR) MISC 2 Devices by Does not apply route every morning. 11/09/18  Yes Shawnee Knapp, MD  EPINEPHrine (EPIPEN 2-PAK) 0.3 mg/0.3 mL IJ SOAJ injection Inject 0.3 mLs (0.3 mg total) into the muscle once as needed. Patient taking differently: Inject 0.3 mg into the muscle once as needed (severe allergic reaction).  05/20/17  Yes Timmothy Euler, [place] D, PA-C   Insulin Pen Needle (PEN NEEDLES) 32G X 4 MM MISC 1 Units by Does not apply route daily. 02/18/19  Yes Rutherford Guys, MD  INVOKANA 300 MG TABS tablet TAKE ONE TABLET BY MOUTH EVERY MORNING BEFORE BREAKFAST *NEED APPOINTMENT FOR REFILLS* 04/04/19  Yes Rutherford Guys, MD  isosorbide mononitrate (IMDUR) 120 MG 24 hr tablet Take 1 tablet (120 mg total) by mouth daily. 04/05/19  Yes Hilty, Nadean Corwin, MD  LANTUS SOLOSTAR 100 UNIT/ML Solostar Pen INJECT 35 UNITS UNDER THE SKIN ONE TIME DAILY AT 10 IN THE EVENING 03/26/19  Yes Sagardia, [employer], MD  lisinopril (ZESTRIL) 5 MG tablet Take 1 tablet (5 mg total) by mouth daily. 02/16/19  Yes Rutherford Guys, MD  loratadine (CLARITIN) 10 MG tablet Take 1 tablet (10 mg total) by mouth daily. 05/01/17  Yes Timmothy Euler, [place] D, PA-C  metoprolol tartrate (LOPRESSOR) 50 MG tablet Take 1 tablet (50 mg total) by mouth 2 (two) times daily. 03/21/19  Yes Hilty, Nadean Corwin, MD  Multiple Vitamin (MULTIVITAMIN WITH MINERALS) TABS tablet Take 1 tablet by mouth daily.   Yes [provider]  NOVOLOG FLEXPEN 100 UNIT/ML FlexPen INJECT 10 UNITS UNDER SKIN PRIOR TO MEALS AND INJECT 5 UNITS UNDER SKIN FOR CBG >250 Patient taking differently: Inject 5-10 Units into the skin See admin instructions. Inject 10 units subcutaneously three times daily before meals, may also inject 5 units up to twice daily as needed for CBG >250 08/09/18  Yes Shawnee Knapp, MD  Omega-3 Fatty Acids (FISH OIL PO) Take by mouth.   Yes [provider]  ranolazine (RANEXA) 500 MG 12 hr tablet Take 1 tablet (500 mg total) by mouth 2 (two) times daily. 12/31/18  Yes Hilty, Nadean Corwin, MD  sertraline (ZOLOFT) 50 MG tablet TAKE ONE TABLET BY MOUTH ONE TIME DAILY 04/14/19  Yes Rutherford Guys, MD  traMADol (ULTRAM) 50 MG tablet Take by mouth every 6 (six) hours as needed.   Yes [provider]  TURMERIC PO Take 1 capsule by mouth daily.    Yes [provider]  varenicline (CHANTIX)  0.5 MG tablet Take 1 tablet (0.5 mg total) by mouth 2 (two) times daily. 04/13/19  Yes Rutherford Guys, MD  amLODipine (NORVASC) 5 MG tablet Take 1 tablet (5 mg total) by mouth daily. 09/06/18 03/07/19  Hilty, Nadean Corwin, MD  nitroGLYCERIN (NITROSTAT) 0.4 MG SL tablet Place 1 tablet (0.4 mg total) under the tongue every 5 (five) minutes as needed for chest pain. 08/18/18 03/07/19  Almyra Deforest, PA    Past Medical History:  Diagnosis Date  . Anxiety   . Arthritis    "maybe in my left  foot" (10/14/2017)  . Bladder prolapse, female, acquired 09/14/2017  . Depression   . High cholesterol   . History of stomach ulcers   . Hypertension   . Myocardial infarction ([place]) 12/30/2004  . Tobacco abuse   . Type II diabetes mellitus ([place])     Past Surgical History:  Procedure Laterality Date  . CARDIAC CATHETERIZATION  10/14/2017  . CORONARY ANGIOPLASTY WITH STENT PLACEMENT  12/30/2004   Patient reported  . CORONARY ARTERY BYPASS GRAFT N/A 10/19/2017   Procedure: CORONARY ARTERY BYPASS GRAFTING (CABG) times four using the right saphaneous vein. Harvested endoscopicly and left internal mammary artery.;  Surgeon: Grace Isaac, MD;  Location: [facility];  Service: Open Heart Surgery;  Laterality: N/A;  . DILATION AND CURETTAGE OF UTERUS  1980s  . LEFT HEART CATH AND CORONARY ANGIOGRAPHY N/A 10/14/2017   Procedure: LEFT HEART CATH AND CORONARY ANGIOGRAPHY;  Surgeon: Martinique, Peter M, MD;  Location: [place] [facility];  Service: Cardiovascular;  Laterality: N/A;  . LEFT HEART CATH AND CORS/GRAFTS ANGIOGRAPHY N/A 01/01/2018   Procedure: LEFT HEART CATH AND CORS/GRAFTS ANGIOGRAPHY;  Surgeon: Jettie Booze, MD;  Location: [facility] [facility];  Service: Cardiovascular;  Laterality: N/A;  . LEFT HEART CATH AND CORS/GRAFTS ANGIOGRAPHY N/A 08/16/2018   Procedure: LEFT HEART CATH AND CORS/GRAFTS ANGIOGRAPHY;  Surgeon: Nelva Bush, MD;  Location: [place] [facility];  Service: Cardiovascular;  Laterality: N/A;   . PILONIDAL CYST EXCISION  1980s  . TEE WITHOUT CARDIOVERSION N/A 10/19/2017   Procedure: TRANSESOPHAGEAL ECHOCARDIOGRAM (TEE);  Surgeon: Grace Isaac, MD;  Location: [facility];  Service: Open Heart Surgery;  Laterality: N/A;    Social History   Tobacco Use  . Smoking status: Current Some Day Smoker    Packs/day: 0.12    Years: 45.00    Pack years: 5.40    Types: Cigarettes    Start date: 05/01/1972    Last attempt to quit: 09/30/2017    Years since quitting: 1.6  . Smokeless tobacco: Never Used  Substance Use Topics  . Alcohol use: Yes    Alcohol/week: 14.0 standard drinks    Types: 14 Glasses of wine per week    Comment: 2 glasses red wine at night    Family History  Problem Relation Age of Onset  . Heart disease Father   . Hyperlipidemia Father   . Hypertension Father     Review of Systems  Constitutional: Negative for chills and fever.  Respiratory: Negative for cough and shortness of breath.   Cardiovascular: Negative for chest pain, palpitations and leg swelling.  Gastrointestinal: Negative for abdominal pain, nausea and vomiting.  per hpi   OBJECTIVE:  Today's Vitals   05/23/19 1050  BP: 114/70  Pulse: 72  Resp: 16  Temp: 98.8 F (37.1 C)  TempSrc: Oral  SpO2: 98%  Weight: 211 lb (95.7 kg)  Height: 5' 6"  (1.676 m)   Body mass index is 34.06 kg/m.  Wt Readings from Last 3 Encounters:  05/23/19 211 lb (95.7 kg)  03/07/19 200 lb (90.7 kg)  12/14/18 200 lb 7 oz (90.9 kg)    Physical Exam Vitals signs and nursing note reviewed.  Constitutional:      Appearance: She is well-developed.  HENT:     Head: Normocephalic and atraumatic.     Mouth/Throat:     Pharynx: No oropharyngeal exudate.  Eyes:     General: No scleral icterus.    Conjunctiva/sclera: Conjunctivae normal.     Pupils:  Pupils are equal, round, and reactive to light.  Neck:     Musculoskeletal: Neck supple.  Cardiovascular:     Rate and Rhythm: Normal rate and regular rhythm.      Heart sounds: Normal heart sounds. No murmur. No friction rub. No gallop.   Pulmonary:     Effort: Pulmonary effort is normal.     Breath sounds: Normal breath sounds. No wheezing or rales.  Musculoskeletal:     Lumbar back: Normal.     Comments: Bilateral hips: FROM, TTP GT, IT band and GM tendon.   Skin:    General: Skin is warm and dry.  Neurological:     Mental Status: She is alert and oriented to person, place, and time.     No results found for this or any previous visit (from the past 24 hour(s)).  Dg Hips Bilat W Or W/o Pelvis 2v  Result Date: 05/23/2019 CLINICAL DATA:  Chronic bilateral hip pain no injury. EXAM: DG HIP (WITH OR WITHOUT PELVIS) 2V BILAT COMPARISON:  None. FINDINGS: There is no evidence of hip fracture or dislocation. There is no evidence of arthropathy or other focal bone abnormality. Pessary noted overlying the pelvis. IMPRESSION: Negative. Electronically Signed   By: Franchot Gallo M.D.   On: 05/23/2019 11:37     ASSESSMENT and PLAN  1. Type 2 diabetes mellitus with hyperglycemia, with long-term current use of insulin (HCC) Not at goal. Discussed titration to fasting goal.  Changing to toujeo as anticipate more than 50 units a day.  - Lipid panel - TSH - Microalbumin/Creatinine Ratio, Urine - Hemoglobin A1c - Comprehensive metabolic panel  2. Essential hypertension Controlled. Continue current regime.   3. Bilateral hip pain Referring to sports medicine for possible GT bursa injections and PT, discussed conservative measures, rx for topical nsaid given. - DG HIPS BILAT W OR W/O PELVIS 2V; Future - Ambulatory referral to Sports Medicine  4. Visit for screening mammogram - MM DIGITAL SCREENING BILATERAL; Future  Other orders - Insulin Glargine, 2 Unit Dial, (TOUJEO MAX SOLOSTAR) 300 UNIT/ML SOPN; Inject 70 Units into the skin at bedtime. - diclofenac sodium (VOLTAREN) 1 % GEL; Apply 4 g topically 4 (four) times daily. - Continuous Blood Gluc  Sensor (FREESTYLE LIBRE 14 DAY SENSOR) MISC; 2 Devices by Does not apply route every morning. - canagliflozin (INVOKANA) 300 MG TABS tablet; TAKE ONE TABLET BY MOUTH EVERY MORNING BEFORE BREAKFAST  Return in about 3 months (around 08/22/2019).    Rutherford Guys, MD Primary Care at [facility], [place] [address] Ph.  [phone] Fax [phone]

## 2019-05-24 ENCOUNTER — Other Ambulatory Visit: Payer: Self-pay | Admitting: Family Medicine

## 2019-05-24 LAB — URINALYSIS, DIPSTICK ONLY
Bilirubin, UA: NEGATIVE
Ketones, UA: NEGATIVE
Leukocytes,UA: NEGATIVE
Nitrite, UA: NEGATIVE
Protein,UA: NEGATIVE
RBC, UA: NEGATIVE
Specific Gravity, UA: 1.024 (ref 1.005–1.030)
Urobilinogen, Ur: 0.2 mg/dL (ref 0.2–1.0)
pH, UA: 6 (ref 5.0–7.5)

## 2019-05-24 LAB — COMPREHENSIVE METABOLIC PANEL
ALT: 18 IU/L (ref 0–32)
AST: 19 IU/L (ref 0–40)
Albumin/Globulin Ratio: 1.3 (ref 1.2–2.2)
Albumin: 4 g/dL (ref 3.8–4.9)
Alkaline Phosphatase: 89 IU/L (ref 39–117)
BUN/Creatinine Ratio: 19 (ref 12–28)
BUN: 20 mg/dL (ref 8–27)
Bilirubin Total: 0.3 mg/dL (ref 0.0–1.2)
CO2: 19 mmol/L — ABNORMAL LOW (ref 20–29)
Calcium: 9.3 mg/dL (ref 8.7–10.3)
Chloride: 100 mmol/L (ref 96–106)
Creatinine, Ser: 1.03 mg/dL — ABNORMAL HIGH (ref 0.57–1.00)
GFR calc Af Amer: 68 mL/min/{1.73_m2} (ref >59)
GFR calc non Af Amer: 59 mL/min/{1.73_m2} — ABNORMAL LOW (ref >59)
Globulin, Total: 3 g/dL (ref 1.5–4.5)
Glucose: 261 mg/dL — ABNORMAL HIGH (ref 65–99)
Potassium: 4.8 mmol/L (ref 3.5–5.2)
Sodium: 136 mmol/L (ref 134–144)
Total Protein: 7 g/dL (ref 6.0–8.5)

## 2019-05-24 LAB — MICROALBUMIN / CREATININE URINE RATIO
Creatinine, Urine: 95.4 mg/dL
Microalb/Creat Ratio: 19 mg/g creat (ref 0–29)
Microalbumin, Urine: 18.3 ug/mL

## 2019-05-24 LAB — HEMOGLOBIN A1C
Est. average glucose Bld gHb Est-mCnc: 223 mg/dL
Hgb A1c MFr Bld: 9.4 % — ABNORMAL HIGH (ref 4.8–5.6)

## 2019-05-24 LAB — TSH: TSH: 2.59 u[IU]/mL (ref 0.450–4.500)

## 2019-05-24 NOTE — Telephone Encounter (Signed)
Pt requesting medication

## 2019-05-24 NOTE — Telephone Encounter (Signed)
Requested medication (s) are due for refill today: yes  Requested medication (s) are on the active medication list: yes Last refill: 01/14/19  Future visit scheduled: yes  Notes to clinic:  This refill cannot be delegated   Requested Prescriptions  Pending Prescriptions Disp Refills   traMADol (ULTRAM) 50 MG tablet [Pharmacy Med Name: TRAMADOL 50 MG TAB[D]] 20 tablet 0    Sig: TAKE ONE TABLET BY MOUTH EVERY 6 HOURS AS NEEDED FOR UP TO 5 DAYS     Not Delegated - Analgesics:  Opioid Agonists Failed - 05/24/2019  7:41 AM      Failed - This refill cannot be delegated      Failed - Urine Drug Screen completed in last 360 days.      Passed - Valid encounter within last 6 months    Recent Outpatient Visits          Yesterday Type 2 diabetes mellitus with hyperglycemia, with long-term current use of insulin [facility])   [facility] at [facility], Lilia Argue, MD   4 months ago Pain of right hip joint   [facility] at [facility], Lilia Argue, MD   9 months ago Essential hypertension   Primary Care at [facility], Gelene Mink, PA-C   1 year ago Acute gout involving toe of right foot, unspecified cause   Primary Care at [facility], Gelene Mink, PA-C   1 year ago Type 2 diabetes mellitus with complication, with long-term current use of insulin [facility])   [facility] at [facility], Laurey Arrow, MD      Future Appointments            In 3 months Rutherford Guys, MD Primary Care at [place], [facility]

## 2019-05-24 NOTE — Telephone Encounter (Signed)
pmp reviewd, appropriate meds refilled

## 2019-05-25 ENCOUNTER — Telehealth: Payer: Self-pay | Admitting: Family Medicine

## 2019-05-25 NOTE — Telephone Encounter (Signed)
error 

## 2019-06-02 ENCOUNTER — Ambulatory Visit
Admission: RE | Admit: 2019-06-02 | Discharge: 2019-06-02 | Disposition: A | Discharge status: Home / Self Care | Payer: BC Managed Care – PPO | Source: Ambulatory Visit | Attending: Sports Medicine | Admitting: Sports Medicine

## 2019-06-02 ENCOUNTER — Ambulatory Visit (INDEPENDENT_AMBULATORY_CARE_PROVIDER_SITE_OTHER): Payer: BC Managed Care – PPO | Admitting: Pediatrics

## 2019-06-02 ENCOUNTER — Other Ambulatory Visit: Payer: Self-pay

## 2019-06-02 VITALS — BP 122/68 | Ht 66.0 in | Wt 210.0 lb

## 2019-06-02 DIAGNOSIS — M4726 Other spondylosis with radiculopathy, lumbar region: Secondary | ICD-10-CM | POA: Diagnosis not present

## 2019-06-02 DIAGNOSIS — G8929 Other chronic pain: Secondary | ICD-10-CM

## 2019-06-02 DIAGNOSIS — M7061 Trochanteric bursitis, right hip: Secondary | ICD-10-CM | POA: Diagnosis not present

## 2019-06-02 DIAGNOSIS — M545 Low back pain, unspecified: Secondary | ICD-10-CM

## 2019-06-02 MED ORDER — METHYLPREDNISOLONE ACETATE 80 MG/ML IJ SUSP
80.0000 mg | Freq: Once | INTRAMUSCULAR | Status: AC
  Administered 2019-06-02: 80 mg via INTRA_ARTICULAR

## 2019-06-02 NOTE — Progress Notes (Signed)
Victoria Eaton - 58 y.o. female MRN [id number]  Date of birth: [birth year]  SUBJECTIVE:   CC: bilateral hip, leg pain  58 yo female with history of CAD (MI with stents and CABG), HTN, insulin dependent T2DM, CKD 2, depression presenting for lower back pain and bilateral hip pain, right worse than left. She reports that pain is intermittent but has been worsening over the past several months since it began. She mainly feels pain "deep in hips" extending into her anterior thighs. Pain is 10/10 at its worst. The pain is always there but there are days when it hurts to walk. She has been trying ice, tramadol, tylenol. She wakes up at night with pain and has found it difficult to sleep on her right side due to pain. No groin pain. Pain is mainly along lateral hip. 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 She is unsure if there has been swelling as she reports gaining a significant amount of weight since COVID quarantine.   ROS: No unexpected weight loss, fever, chills, swelling, instability, muscle pain, numbness/tingling, redness, no changes to bowel or bladder function, otherwise see HPI   PMHx - Updated and reviewed.  Contributory factors include:  CAD (MI in January 2019 with CABG, stents in November 2019), HTN, insulin dependent T2DM, CKD 2, depression  PSHx - Updated and reviewed.  Contributory factors include: CABG in January 2019 FHx - Updated and reviewed.  Contributory factors include:  Negative Social Hx - Updated and reviewed. Contributory factors include: Negative Medications - reviewed   DATA REVIEWED: Prior records  PHYSICAL EXAM:  VS: BP:122/68  HR: bpm  TEMP: ( )  RESP:   HT:5' 6"  (167.6 cm)   WT:210 lb (95.3 kg)  BMI:33.91 PHYSICAL EXAM: Gen: NAD, alert, cooperative with exam, well-appearing HEENT: clear conjunctiva,  CV:  no edema, capillary refill brisk, normal rate Resp: non-labored Skin: no rashes, normal turgor   Neuro: no gross deficits.  Psych:  alert and oriented  Right Knee: - Inspection: no gross deformity. No swelling/effusion, erythema or bruising. Skin intact - Palpation: no TTP  - ROM: full active ROM with flexion and extension in knee and hip - Strength: 5/5 strength - Neuro/vasc: NV intact - Special Tests: - LIGAMENTS: negative anterior and posterior drawer, no MCL or LCL laxity   Right Hip:  - Inspection: No gross deformity, no swelling, erythema, or ecchymosis - Palpation: significant TTP over greater trochanter - ROM: Normal range of motion on Flexion, extension, abduction, internal and external rotation - Strength: weak resisted hip abduction - Neuro/vasc: NV intact distally - Special Tests: Negative FABER and FADIR.  Negative Scour.  Negative Trendelenberg.    Hip:  - Inspection: No gross deformity, no swelling, erythema, or ecchymosis - Palpation: Mild TTP over greater trochanter - ROM: Normal range of motion on Flexion, extension, abduction, internal and external rotation - Strength: weak resisted hip abduction - Neuro/vasc: NV intact distally - Special Tests: Negative FABER and FADIR. Negative Trendelenberg.   Lumbar spine: - Inspection: no gross deformity or asymmetry, swelling or ecchymosis - Palpation: TTP over L5-S1, no TTP over paraspinal muscles, or SI joints b/l - ROM: full active ROM of the lumbar spine in flexion and extension without pain - Strength: 5/5 strength of lower extremity in L4-S1 nerve root distributions b/l; normal gait - Neuro: sensation intact in the L4-S1 nerve root distribution b/l, 2+ L4 and S1 reflexes - Special testing: Negative straight  leg raise, negative Stork test, Negative FABER   ASSESSMENT & PLAN:  58 yo female with complex medical history presenting with lower back pain, bilateral hip pain. Suspect that most symptoms are due to possible lumbar spinal stenosis with radicular pain as she has tenderness over L5-S1 and pain worse with  walking. Significant TTP over greater trochanter of right hip with weak hip abductors is consistent with greater trochanteric bursitis. Patient is agreeable to injection as pain has been significant enough to keep her from sleeping.   After informed written consent timeout was performed, patient was lying on left side on exam table.  Area overlying right trochanteric bursa prepped with alcohol swab then injected with 6:2 lidocaine: depomedrol.  Patient tolerated procedure well without immediate complications. She reported improvement in lateral hip pain after procedure. Given history of T2 insulin dependent diabetes, recommended checking glucose over next several days given expected increase due to corticosteroid injection. Patient on SSI and reports that she can adjust accordingly.  Will obtain x-rays of lumbar spine and refer to physical therapy to work on hip abduction strength, help with lower back pain. Follow up in 1 month.   I was the preceptor for this visit and available for immediate consultation Shellia Cleverly, DO

## 2019-06-03 ENCOUNTER — Encounter: Payer: Self-pay | Admitting: Pediatrics

## 2019-06-09 ENCOUNTER — Other Ambulatory Visit: Payer: Self-pay | Admitting: Family Medicine

## 2019-06-09 NOTE — Telephone Encounter (Signed)
Requested medication (s) are due for refill today: yes  Requested medication (s) are on the active medication list  yes    Last refill: 05/24/2019  #20 0 refills  Future visit scheduled yes 08/22/2019  Notes to clinic:not delegated  Requested Prescriptions  Pending Prescriptions Disp Refills   traMADol (ULTRAM) 50 MG tablet [Pharmacy Med Name: TRAMADOL 50 MG TAB[D]] 20 tablet 0    Sig: TAKE ONE TABLET BY MOUTH EVERY 6 HOURS AS NEEDED FOR UP TO 5 DAYS     Not Delegated - Analgesics:  Opioid Agonists Failed - 06/09/2019  6:25 PM      Failed - This refill cannot be delegated      Failed - Urine Drug Screen completed in last 360 days.      Passed - Valid encounter within last 6 months    Recent Outpatient Visits          2 weeks ago Type 2 diabetes mellitus with hyperglycemia, with long-term current use of insulin [facility])   Primary [facility] at [facility], Lilia Argue, MD   4 months ago Pain of right hip joint   [facility] at [facility], Lilia Argue, MD   10 months ago Essential hypertension   Primary Care at [facility], Gelene Mink, PA-C   1 year ago Acute gout involving toe of right foot, unspecified cause   Primary Care at [facility], Gelene Mink, PA-C   1 year ago Type 2 diabetes mellitus with complication, with long-term current use of insulin [facility])   Primary Care at [facility], Laurey Arrow, MD      Future Appointments            In 2 months Rutherford Guys, MD Primary Care at [place], [facility]

## 2019-06-14 DIAGNOSIS — M25552 Pain in left hip: Secondary | ICD-10-CM | POA: Diagnosis not present

## 2019-06-14 DIAGNOSIS — M25551 Pain in right hip: Secondary | ICD-10-CM | POA: Diagnosis not present

## 2019-06-14 DIAGNOSIS — M545 Low back pain: Secondary | ICD-10-CM | POA: Diagnosis not present

## 2019-06-14 DIAGNOSIS — R262 Difficulty in walking, not elsewhere classified: Secondary | ICD-10-CM | POA: Diagnosis not present

## 2019-06-21 ENCOUNTER — Other Ambulatory Visit: Payer: Self-pay | Admitting: Family Medicine

## 2019-06-21 ENCOUNTER — Other Ambulatory Visit: Payer: Self-pay | Admitting: Internal Medicine

## 2019-06-21 DIAGNOSIS — F32A Depression, unspecified: Secondary | ICD-10-CM

## 2019-06-21 DIAGNOSIS — F329 Major depressive disorder, single episode, unspecified: Secondary | ICD-10-CM

## 2019-06-22 DIAGNOSIS — R262 Difficulty in walking, not elsewhere classified: Secondary | ICD-10-CM | POA: Diagnosis not present

## 2019-06-22 DIAGNOSIS — M25551 Pain in right hip: Secondary | ICD-10-CM | POA: Diagnosis not present

## 2019-06-22 DIAGNOSIS — M545 Low back pain: Secondary | ICD-10-CM | POA: Diagnosis not present

## 2019-06-22 DIAGNOSIS — M25552 Pain in left hip: Secondary | ICD-10-CM | POA: Diagnosis not present

## 2019-06-30 ENCOUNTER — Ambulatory Visit: Payer: BC Managed Care – PPO | Admitting: Pediatrics

## 2019-07-05 IMAGING — DX DG FOREARM 2V*L*
2 series · 2 of 2 positions shown · non-contrast
Comparison: None.

CLINICAL DATA: Pain following fall

EXAM:
LEFT FOREARM - 2 VIEW

[forearm ap]
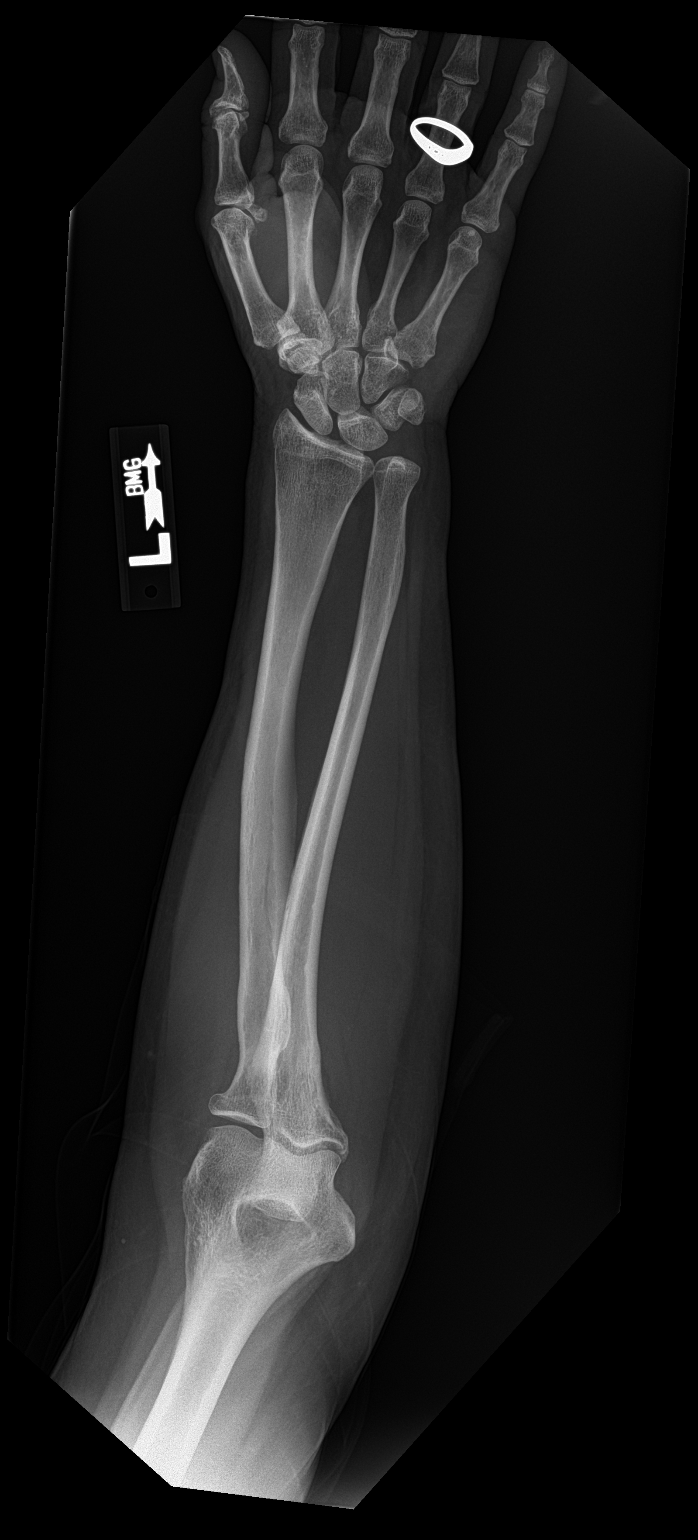

[forearm lat]
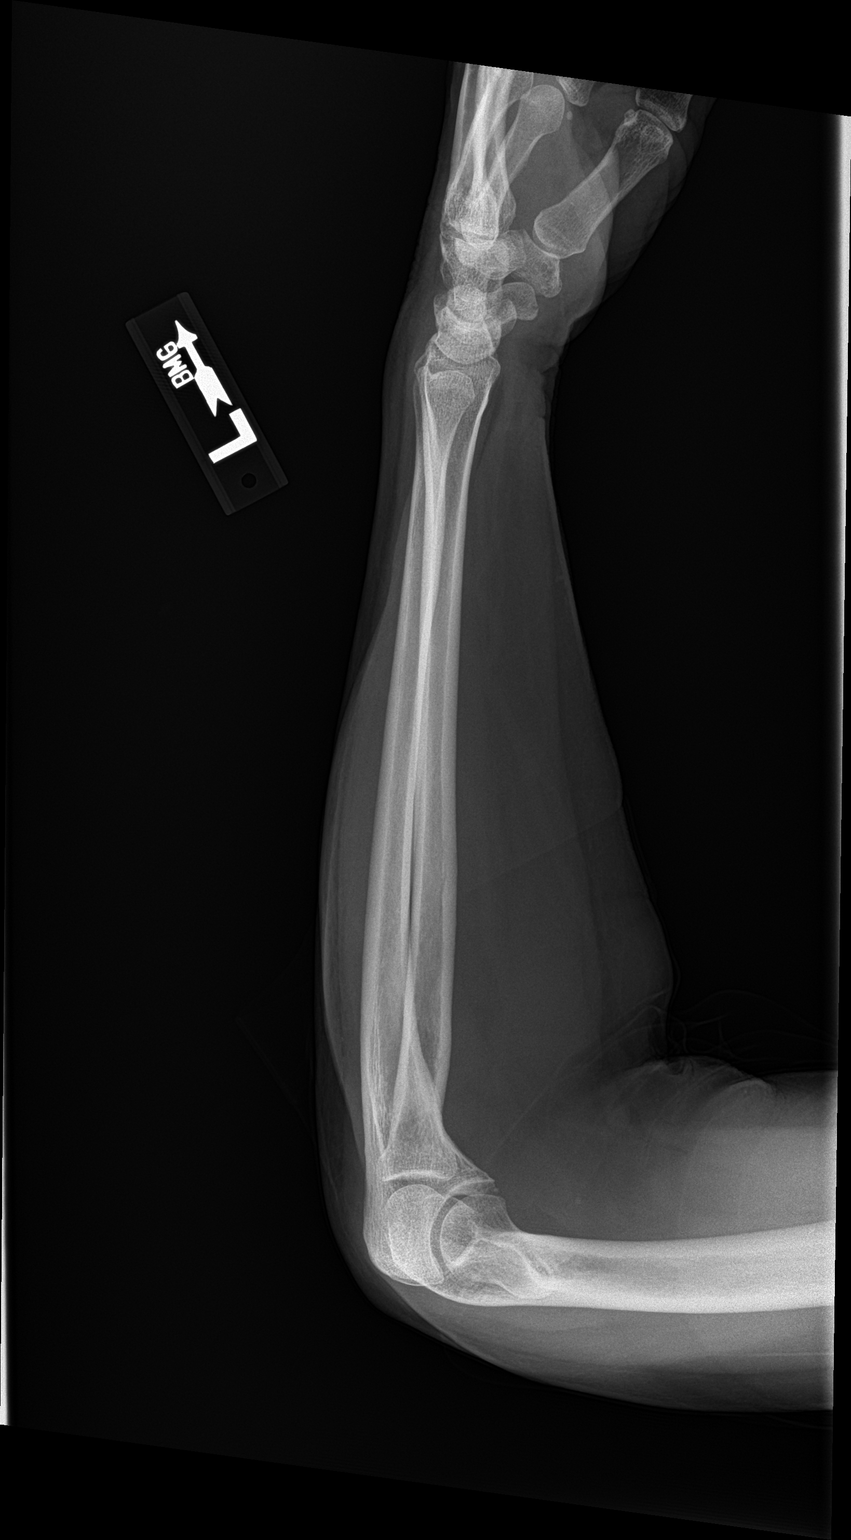

[2 of 2 positions shown; findings below may reference images not displayed]

FINDINGS: Frontal and lateral views were obtained. No fracture or dislocation.
Joint spaces appear normal. No erosive change.
IMPRESSION: No fracture or dislocation.  No appreciable arthropathy.

## 2019-07-18 ENCOUNTER — Other Ambulatory Visit: Payer: Self-pay | Admitting: Family Medicine

## 2019-07-18 ENCOUNTER — Other Ambulatory Visit: Payer: Self-pay | Admitting: Internal Medicine

## 2019-07-18 DIAGNOSIS — F419 Anxiety disorder, unspecified: Secondary | ICD-10-CM

## 2019-07-18 DIAGNOSIS — R0789 Other chest pain: Secondary | ICD-10-CM | POA: Diagnosis not present

## 2019-07-18 DIAGNOSIS — F329 Major depressive disorder, single episode, unspecified: Secondary | ICD-10-CM

## 2019-07-18 DIAGNOSIS — I10 Essential (primary) hypertension: Secondary | ICD-10-CM | POA: Diagnosis not present

## 2019-07-18 DIAGNOSIS — R079 Chest pain, unspecified: Secondary | ICD-10-CM | POA: Diagnosis not present

## 2019-07-18 DIAGNOSIS — F32A Depression, unspecified: Secondary | ICD-10-CM

## 2019-08-08 ENCOUNTER — Other Ambulatory Visit: Payer: Self-pay | Admitting: Family Medicine

## 2019-08-08 NOTE — Telephone Encounter (Signed)
Requested medication (s) are due for refill today: yes  Requested medication (s) are on the active medication list: yes  Last refill:  05/19/2019  Future visit scheduled: yes  Notes to clinic:  Last prescribed by Dr. Brigitte Pulse   Requested Prescriptions  Pending Prescriptions Disp Refills   NOVOLOG FLEXPEN 100 UNIT/ML FlexPen [Pharmacy Med Name: NOVOLOG FLEXPEN SYR[*R]] 30 mL 1    Sig: INJECT 10 UNITS UNDER THE SKIN PRIOR TO MEALS AND INJECT 5 UNITS UNDER THE SKIN FOR CBG>250     Endocrinology:  Diabetes - Insulins Failed - 08/08/2019  9:35 AM      Failed - HBA1C is between 0 and 7.9 and within 180 days    Hgb A1c MFr Bld  Date Value Ref Range Status  05/23/2019 9.4 (H) 4.8 - 5.6 % Final    Comment:             Prediabetes: 5.7 - 6.4          Diabetes: >6.4          Glycemic control for adults with diabetes: <7.0          Passed - Valid encounter within last 6 months    Recent Outpatient Visits          2 months ago Type 2 diabetes mellitus with hyperglycemia, with long-term current use of insulin ([place])   Primary Care at [facility], Lilia Argue, MD   6 months ago Pain of right hip joint   [facility] at [facility], Lilia Argue, MD   1 year ago Essential hypertension   Primary Care at [facility], [place], PA-C   1 year ago Acute gout involving toe of right foot, unspecified cause   Primary Care at [facility], Gelene Mink, PA-C   1 year ago Type 2 diabetes mellitus with complication, with long-term current use of insulin [facility])   Primary Care at [facility], Laurey Arrow, MD      Future Appointments            In 2 weeks Rutherford Guys, MD Primary Care at [place], [facility]

## 2019-08-09 NOTE — Telephone Encounter (Signed)
Pt needs office appt for blood work

## 2019-08-09 NOTE — Telephone Encounter (Signed)
Pt has upcoming appt on 11/30

## 2019-08-13 ENCOUNTER — Other Ambulatory Visit: Payer: Self-pay | Admitting: Family Medicine

## 2019-08-17 ENCOUNTER — Other Ambulatory Visit: Payer: Self-pay | Admitting: Internal Medicine

## 2019-08-22 ENCOUNTER — Other Ambulatory Visit: Payer: Self-pay | Admitting: Family Medicine

## 2019-08-22 ENCOUNTER — Other Ambulatory Visit: Payer: Self-pay

## 2019-08-22 ENCOUNTER — Encounter: Payer: Self-pay | Admitting: Family Medicine

## 2019-08-22 ENCOUNTER — Ambulatory Visit (INDEPENDENT_AMBULATORY_CARE_PROVIDER_SITE_OTHER): Payer: BC Managed Care – PPO | Admitting: Family Medicine

## 2019-08-22 VITALS — BP 111/66 | HR 77 | Temp 97.6°F | Ht 66.0 in | Wt 212.8 lb

## 2019-08-22 DIAGNOSIS — F419 Anxiety disorder, unspecified: Secondary | ICD-10-CM

## 2019-08-22 DIAGNOSIS — E118 Type 2 diabetes mellitus with unspecified complications: Secondary | ICD-10-CM

## 2019-08-22 DIAGNOSIS — I10 Essential (primary) hypertension: Secondary | ICD-10-CM

## 2019-08-22 DIAGNOSIS — Z794 Long term (current) use of insulin: Secondary | ICD-10-CM

## 2019-08-22 DIAGNOSIS — M65341 Trigger finger, right ring finger: Secondary | ICD-10-CM

## 2019-08-22 DIAGNOSIS — E785 Hyperlipidemia, unspecified: Secondary | ICD-10-CM | POA: Diagnosis not present

## 2019-08-22 DIAGNOSIS — F32A Depression, unspecified: Secondary | ICD-10-CM

## 2019-08-22 DIAGNOSIS — F329 Major depressive disorder, single episode, unspecified: Secondary | ICD-10-CM

## 2019-08-22 MED ORDER — SERTRALINE HCL 100 MG PO TABS: 100.0000 mg | ORAL_TABLET | Freq: Every day | ORAL | 1 refills | Status: DC

## 2019-08-22 NOTE — Telephone Encounter (Signed)
Requesting plavix, that med has not been refilled by current pcp. Last lab 08/2018

## 2019-08-22 NOTE — Progress Notes (Signed)
11/30/202010:49 AM  Victoria Eaton 07-28-1961, 58 y.o., female [id number]  Chief Complaint  Patient presents with  . MEDICATION condition f/u  . Diabetes  . Depression    HPI:   Patient is a 58 y.o. female with past medical history significant for CAD (MI, stents, CABG), HTN, DM2 on insulin, HLP, CKD 2 2/2 uterine prolapse, smoke, depression who presents today for routine followup  Last OV Aug 2020 - titration of toujeo to fasting goal She increased lantus up to 45 units and had nocturnal hypoglycemia, backed to 42 units and fastings back to being high Uses humalog 10-15 units AC, hardly ever misses doses Uses invokana daily Uses cgm, 7 day avg 197  Depression has not been well controlled these past several weeks, tends to do worse in the winter Sign family stressors Tearful, sign anhedonia Started sertraline 10m in July - tolerating well Takes wellbutrin 3011mPHq 9 noted  Has trigger finger in right hand Able to move finger Happens intermittently   Lab Results  Component Value Date   HGBA1C 9.4 (H) 05/23/2019   HGBA1C 7.8 (H) 08/03/2018   HGBA1C 8.6 12/07/2017   Lab Results  Component Value Date   LDLCALC 84 05/23/2019   CREATININE 1.03 (H) 05/23/2019    Depression screen PHQ 2/9 08/22/2019 05/23/2019 01/14/2019  Decreased Interest 2 0 1  Down, Depressed, Hopeless 2 0 1  PHQ - 2 Score 4 0 2  Altered sleeping 0 - 3  Tired, decreased energy 3 - 3  Change in appetite 3 - 0  Feeling bad or failure about yourself  3 - 1  Trouble concentrating 3 - 2  Moving slowly or fidgety/restless 0 - 0  Suicidal thoughts 0 - 0  PHQ-9 Score 16 - 11  Difficult doing work/chores Somewhat difficult - Somewhat difficult  Some recent data might be hidden    Fall Risk  08/22/2019 05/23/2019 01/14/2019 08/03/2018 04/22/2018  Falls in the past year? 0 0 1 1 Yes  Number falls in past yr: 0 - 0 0 1  Injury with Fall? 0 0 0 1 Yes  Comment - - - fractured arm  injured L elbow   Follow up Falls evaluation completed - - - -     Allergies  Allergen Reactions  . Januvia [Sitagliptin] Nausea And Vomiting and Other (See Comments)    Pancreatitis (this was in Epic, but patient was unaware of this)   . Metformin And Related Diarrhea  . Penicillins Other (See Comments)    Unknown childhood reaction Has patient had a PCN reaction causing immediate rash, facial/tongue/throat swelling, SOB or lightheadedness with hypotension: Unknown Has patient had a PCN reaction causing severe rash involving mucus membranes or skin necrosis: Unknown Has patient had a PCN reaction that required hospitalization: Unknown Has patient had a PCN reaction occurring within the last 10 years: No If all of the above answers are "NO", then may proceed with Cephalosporin use.     Prior to Admission medications   Medication Sig Start Date End Date Taking? Authorizing Provider  amLODipine (NORVASC) 5 MG tablet TAKE ONE TABLET BY MOUTH ONE TIME DAILY 08/17/19  Yes Hilty, KeNadean CorwinMD  atorvastatin (LIPITOR) 80 MG tablet TAKE ONE TABLET BY MOUTH ONE TIME DAILY AT 6 IN THE EVENING *NEED APPOINTMENT WITH FASTING LABS FOR REFILLS* 06/21/19  Yes SaRutherford GuysMD  buPROPion (WELLBUTRIN XL) 300 MG 24 hr tablet TAKE ONE TABLET BY MOUTH ONE TIME DAILY 07/18/19  Yes SaGrant Fontana  M, MD  canagliflozin (INVOKANA) 300 MG TABS tablet TAKE ONE TABLET BY MOUTH EVERY MORNING BEFORE BREAKFAST 05/23/19  Yes Rutherford Guys, MD  CHANTIX 0.5 MG tablet TAKE ONE TABLET BY MOUTH TWICE A DAY 06/21/19  Yes Rutherford Guys, MD  clopidogrel (PLAVIX) 75 MG tablet Take 1 tablet (75 mg total) by mouth daily. 08/11/18  Yes Shawnee Knapp, MD  Continuous Blood Gluc Sensor (FREESTYLE LIBRE 14 DAY SENSOR) MISC 2 Devices by Does not apply route every morning. 05/23/19  Yes Rutherford Guys, MD  diclofenac sodium (VOLTAREN) 1 % GEL Apply 4 g topically 4 (four) times daily. 05/23/19  Yes Rutherford Guys, MD  EPINEPHrine (EPIPEN 2-PAK)  0.3 mg/0.3 mL IJ SOAJ injection Inject 0.3 mLs (0.3 mg total) into the muscle once as needed. Patient taking differently: Inject 0.3 mg into the muscle once as needed (severe allergic reaction).  05/20/17  Yes Tenna Delaine D, PA-C  isosorbide mononitrate (IMDUR) 120 MG 24 hr tablet Take 1 tablet (120 mg total) by mouth daily. 04/05/19  Yes Hilty, Nadean Corwin, MD  lisinopril (ZESTRIL) 5 MG tablet Take 1 tablet (5 mg total) by mouth daily. 02/16/19  Yes Rutherford Guys, MD  loratadine (CLARITIN) 10 MG tablet Take 1 tablet (10 mg total) by mouth daily. 05/01/17  Yes Timmothy Euler, [place] D, PA-C  metoprolol tartrate (LOPRESSOR) 50 MG tablet TAKE ONE TABLET BY MOUTH TWICE A DAY 07/18/19  Yes Hilty, Nadean Corwin, MD  Multiple Vitamin (MULTIVITAMIN WITH MINERALS) TABS tablet Take 1 tablet by mouth daily.   Yes [provider]  nitroGLYCERIN (NITROSTAT) 0.4 MG SL tablet Place 1 tablet (0.4 mg total) under the tongue every 5 (five) minutes as needed for chest pain. 08/18/18 08/22/19 Yes Meng, Hao, PA  NOVOLOG FLEXPEN 100 UNIT/ML FlexPen INJECT 10 UNITS UNDER SKIN PRIOR TO MEALS AND INJECT 5 UNITS UNDER SKIN FOR CBG >250 Patient taking differently: Inject 5-10 Units into the skin See admin instructions. Inject 10 units subcutaneously three times daily before meals, may also inject 5 units up to twice daily as needed for CBG >250 08/09/18  Yes Shawnee Knapp, MD  ranolazine (RANEXA) 500 MG 12 hr tablet Take 1 tablet (500 mg total) by mouth 2 (two) times daily. 12/31/18  Yes Hilty, Nadean Corwin, MD  sertraline (ZOLOFT) 50 MG tablet TAKE ONE TABLET BY MOUTH ONE TIME DAILY 04/14/19  Yes Rutherford Guys, MD  TOUJEO MAX SOLOSTAR 300 UNIT/ML SOPN INJECT 70 UNITS INTO THE SKIN AT BEDTIME 08/13/19  Yes Rutherford Guys, MD  TURMERIC PO Take 1 capsule by mouth daily.    Yes [provider]  Flossie Buffy MICRO PEN NEEDLES 32G X 4 MM MISC USE WITH INJECTIONS DAILY 06/21/19  Yes Rutherford Guys, MD  aspirin 81 MG EC tablet  Take 1 tablet (81 mg total) by mouth daily. Patient not taking: Reported on 08/22/2019 02/11/18   Ledora Bottcher, PA  Omega-3 Fatty Acids (FISH OIL PO) Take by mouth.    [provider]  traMADol (ULTRAM) 50 MG tablet TAKE ONE TABLET BY MOUTH EVERY 6 HOURS AS NEEDED FOR UP TO 5 DAYS Patient not taking: Reported on 08/22/2019 05/24/19   Rutherford Guys, MD    Past Medical History:  Diagnosis Date  . Anxiety   . Arthritis    "maybe in my left foot" (10/14/2017)  . Bladder prolapse, female, acquired 09/14/2017  . Depression   . High cholesterol   . History of  stomach ulcers   . Hypertension   . Myocardial infarction ([place]) 12/30/2004  . Tobacco abuse   . Type II diabetes mellitus ([place])     Past Surgical History:  Procedure Laterality Date  . CARDIAC CATHETERIZATION  10/14/2017  . CORONARY ANGIOPLASTY WITH STENT PLACEMENT  12/30/2004   Patient reported  . CORONARY ARTERY BYPASS GRAFT N/A 10/19/2017   Procedure: CORONARY ARTERY BYPASS GRAFTING (CABG) times four using the right saphaneous vein. Harvested endoscopicly and left internal mammary artery.;  Surgeon: Grace Isaac, MD;  Location: [facility];  Service: Open Heart Surgery;  Laterality: N/A;  . DILATION AND CURETTAGE OF UTERUS  1980s  . LEFT HEART CATH AND CORONARY ANGIOGRAPHY N/A 10/14/2017   Procedure: LEFT HEART CATH AND CORONARY ANGIOGRAPHY;  Surgeon: Martinique, Peter M, MD;  Location: [facility];  Service: Cardiovascular;  Laterality: N/A;  . LEFT HEART CATH AND CORS/GRAFTS ANGIOGRAPHY N/A 01/01/2018   Procedure: LEFT HEART CATH AND CORS/GRAFTS ANGIOGRAPHY;  Surgeon: Jettie Booze, MD;  Location: [facility];  Service: Cardiovascular;  Laterality: N/A;  . LEFT HEART CATH AND CORS/GRAFTS ANGIOGRAPHY N/A 08/16/2018   Procedure: LEFT HEART CATH AND CORS/GRAFTS ANGIOGRAPHY;  Surgeon: Nelva Bush, MD;  Location: [facility];  Service: Cardiovascular;  Laterality: N/A;  . PILONIDAL CYST  EXCISION  1980s  . TEE WITHOUT CARDIOVERSION N/A 10/19/2017   Procedure: TRANSESOPHAGEAL ECHOCARDIOGRAM (TEE);  Surgeon: Grace Isaac, MD;  Location: [facility];  Service: Open Heart Surgery;  Laterality: N/A;    Social History   Tobacco Use  . Smoking status: Current Some Day Smoker    Packs/day: 0.12    Years: 45.00    Pack years: 5.40    Types: Cigarettes    Start date: 05/01/1972    Last attempt to quit: 09/30/2017    Years since quitting: 1.8  . Smokeless tobacco: Never Used  Substance Use Topics  . Alcohol use: Yes    Alcohol/week: 14.0 standard drinks    Types: 14 Glasses of wine per week    Comment: 2 glasses red wine at night    Family History  Problem Relation Age of Onset  . Heart disease Father   . Hyperlipidemia Father   . Hypertension Father     Review of Systems  Constitutional: Negative for chills and fever.  Respiratory: Negative for cough and shortness of breath.   Cardiovascular: Negative for chest pain, palpitations and leg swelling.  Gastrointestinal: Negative for abdominal pain, nausea and vomiting.     OBJECTIVE:  Today's Vitals   08/22/19 1054  BP: 111/66  Pulse: 77  Temp: 97.6 F (36.4 C)  TempSrc: Oral  SpO2: 92%  Weight: 212 lb 12.8 oz (96.5 kg)  Height: 5' 6"  (1.676 m)   Body mass index is 34.35 kg/m.   Physical Exam Vitals signs and nursing note reviewed.  Constitutional:      Appearance: She is well-developed.  HENT:     Head: Normocephalic and atraumatic.     Mouth/Throat:     Pharynx: No oropharyngeal exudate.  Eyes:     General: No scleral icterus.    Conjunctiva/sclera: Conjunctivae normal.     Pupils: Pupils are equal, round, and reactive to light.  Neck:     Musculoskeletal: Neck supple.  Cardiovascular:     Rate and Rhythm: Normal rate and regular rhythm.     Heart sounds: Normal heart sounds. No murmur. No friction rub. No gallop.   Pulmonary:  Effort: Pulmonary effort is normal.     Breath sounds: Normal  breath sounds. No wheezing or rales.  Skin:    General: Skin is warm and dry.  Neurological:     Mental Status: She is alert and oriented to person, place, and time.     No results found for this or any previous visit (from the past 24 hour(s)).  No results found.   ASSESSMENT and PLAN  1. Type 2 diabetes mellitus with complication, with long-term current use of insulin (HCC) Discussed trial of lantus in AM and titrate to fasting goal given nocturnal hypoglycemia.  - Comprehensive metabolic panel - Hemoglobin A1c  2. Hyperlipidemia LDL goal <70 Checking labs today, medications will be adjusted as needed.  - Lipid panel  3. Essential hypertension Controlled. Continue current regime.   4. Anxiety and depression Not controlled. Increase sertraline to 197m daily.  5. Trigger ring finger of right hand Discussed conservative measures, consider referral.   Other orders - sertraline (ZOLOFT) 100 MG tablet; Take 1 tablet (100 mg total) by mouth daily.  Return in about 3 months (around 11/20/2019).    IRutherford Guys MD [facility] [place] [id number].  [phone] [phone]

## 2019-08-22 NOTE — Telephone Encounter (Signed)
Public pharmacy called in regards to this prescription and Novolog please advise

## 2019-08-22 NOTE — Telephone Encounter (Signed)
Requested medication (s) are due for refill today: yes  Requested medication (s) are on the active medication list: yes  Last refill:  05/19/2019  Future visit scheduled: yes  Notes to clinic: patient has appointment today   Requested Prescriptions  Pending Prescriptions Disp Refills   clopidogrel (PLAVIX) 75 MG tablet [Pharmacy Med Name: CLOPIDOGREL 75 MG TAB] 90 tablet 3    Sig: TAKE ONE TABLET BY MOUTH ONE TIME DAILY     Hematology: Antiplatelets - clopidogrel Failed - 08/22/2019  9:25 AM      Failed - Evaluate AST, ALT within 2 months of therapy initiation.      Failed - HCT in normal range and within 180 days    HCT  Date Value Ref Range Status  12/14/2018 36.2 36.0 - 46.0 % Final   Hematocrit  Date Value Ref Range Status  05/01/2017 39.9 34.0 - 46.6 % Final         Failed - HGB in normal range and within 180 days    Hemoglobin  Date Value Ref Range Status  12/14/2018 10.9 (L) 12.0 - 15.0 g/dL Final  05/01/2017 12.9 11.1 - 15.9 g/dL Final         Failed - PLT in normal range and within 180 days    Platelets  Date Value Ref Range Status  12/14/2018 227 150 - 400 K/uL Final  05/01/2017 206 150 - 379 x10E3/uL Final         Passed - ALT in normal range and within 360 days    ALT  Date Value Ref Range Status  05/23/2019 18 0 - 32 IU/L Final         Passed - AST in normal range and within 360 days    AST  Date Value Ref Range Status  05/23/2019 19 0 - 40 IU/L Final         Passed - Valid encounter within last 6 months    Recent Outpatient Visits          3 months ago Type 2 diabetes mellitus with hyperglycemia, with long-term current use of insulin ([place])   [facility], Lilia Argue, MD   7 months ago Pain of right hip joint   [facility], Lilia Argue, MD   1 year ago Essential hypertension   Primary Care at [facility], Gelene Mink, PA-C   1 year ago Acute gout involving toe of right foot, unspecified cause   Primary  Care at [facility], Gelene Mink, PA-C   1 year ago Type 2 diabetes mellitus with complication, with long-term current use of insulin [facility])   [facility] at [facility], Laurey Arrow, MD      Future Appointments            Today Rutherford Guys, MD Primary Care at [place], [facility]

## 2019-08-23 ENCOUNTER — Other Ambulatory Visit: Payer: Self-pay | Admitting: Family Medicine

## 2019-08-23 LAB — LIPID PANEL
Chol/HDL Ratio: 3.1 ratio (ref 0.0–4.4)
Cholesterol, Total: 200 mg/dL — ABNORMAL HIGH (ref 100–199)
HDL: 64 mg/dL (ref >39)
LDL Chol Calc (NIH): 77 mg/dL (ref 0–99)
Triglycerides: 374 mg/dL — ABNORMAL HIGH (ref 0–149)
VLDL Cholesterol Cal: 59 mg/dL — ABNORMAL HIGH (ref 5–40)

## 2019-08-23 LAB — COMPREHENSIVE METABOLIC PANEL
ALT: 16 IU/L (ref 0–32)
AST: 14 IU/L (ref 0–40)
Albumin/Globulin Ratio: 1.2 (ref 1.2–2.2)
Albumin: 4.2 g/dL (ref 3.8–4.9)
Alkaline Phosphatase: 100 IU/L (ref 39–117)
BUN/Creatinine Ratio: 19 (ref 12–28)
BUN: 26 mg/dL (ref 8–27)
Bilirubin Total: 0.4 mg/dL (ref 0.0–1.2)
CO2: 21 mmol/L (ref 20–29)
Calcium: 9.6 mg/dL (ref 8.7–10.3)
Chloride: 100 mmol/L (ref 96–106)
Creatinine, Ser: 1.36 mg/dL — ABNORMAL HIGH (ref 0.57–1.00)
GFR calc Af Amer: 49 mL/min/{1.73_m2} — ABNORMAL LOW (ref >59)
GFR calc non Af Amer: 42 mL/min/{1.73_m2} — ABNORMAL LOW (ref >59)
Globulin, Total: 3.5 g/dL (ref 1.5–4.5)
Glucose: 282 mg/dL — ABNORMAL HIGH (ref 65–99)
Potassium: 4.9 mmol/L (ref 3.5–5.2)
Sodium: 136 mmol/L (ref 134–144)
Total Protein: 7.7 g/dL (ref 6.0–8.5)

## 2019-08-23 LAB — HEMOGLOBIN A1C
Est. average glucose Bld gHb Est-mCnc: 197 mg/dL
Hgb A1c MFr Bld: 8.5 % — ABNORMAL HIGH (ref 4.8–5.6)

## 2019-08-23 MED ORDER — CANAGLIFLOZIN 100 MG PO TABS: 100.0000 mg | ORAL_TABLET | Freq: Every day | ORAL | 1 refills | Status: DC

## 2019-08-24 ENCOUNTER — Other Ambulatory Visit: Payer: Self-pay | Admitting: Family Medicine

## 2019-08-26 ENCOUNTER — Telehealth: Payer: Self-pay | Admitting: Family Medicine

## 2019-08-26 NOTE — Telephone Encounter (Signed)
Copied from [place] [id number]. Topic: General - Other >> Aug 26, 2019 12:10 PM Sheran Luz wrote: Pharmacy calling about a request. Calling to check status. Office closed for lunch.

## 2019-08-27 ENCOUNTER — Other Ambulatory Visit: Payer: Self-pay | Admitting: Family Medicine

## 2019-08-27 NOTE — Telephone Encounter (Signed)
Refill requests approved per protocol.  Requested Prescriptions  Pending Prescriptions Disp Refills  . CHANTIX 0.5 MG tablet [Pharmacy Med Name: CHANTIX 0.5 MG TAB[**$]] 56 tablet 3    Sig: TAKE ONE TABLET BY MOUTH TWICE A DAY     Psychiatry:  Drug Dependence Therapy Passed - 08/27/2019  1:25 PM      Passed - Valid encounter within last 12 months    Recent Outpatient Visits          5 days ago Type 2 diabetes mellitus with complication, with long-term current use of insulin ([place])   [facility], Lilia Argue, MD   3 months ago Type 2 diabetes mellitus with hyperglycemia, with long-term current use of insulin [facility])   [facility], Lilia Argue, MD   7 months ago Pain of right hip joint   [facility], Lilia Argue, MD   1 year ago Essential hypertension   Primary Care at [facility], Gelene Mink, PA-C   1 year ago Acute gout involving toe of right foot, unspecified cause   Primary Care at [facility], Gelene Mink, PA-C      Future Appointments            In 2 months Pamella Pert, Lilia Argue, MD Primary Care at [place], [facility]           . Continuous Blood Gluc Sensor (FREESTYLE LIBRE 14 DAY SENSOR) MISC [Pharmacy Med Name: FREESTYLE LIBRE 14 DAY SENSOR]  5    Sig: APPLY ONE SENSOR TO THE BACK OF YOUR UPPER ARM. REPLACE EVERY 14 DAYS.     Endocrinology: Diabetes - Testing Supplies Passed - 08/27/2019  1:25 PM      Passed - Valid encounter within last 12 months    Recent Outpatient Visits          5 days ago Type 2 diabetes mellitus with complication, with long-term current use of insulin [facility])   [facility], Lilia Argue, MD   3 months ago Type 2 diabetes mellitus with hyperglycemia, with long-term current use of insulin [facility])   [facility], Lilia Argue, MD   7 months ago Pain of right hip joint   [facility], Lilia Argue, MD   1 year ago Essential hypertension   Primary Care at [facility],  Gelene Mink, PA-C   1 year ago Acute gout involving toe of right foot, unspecified cause   Primary Care at [facility], Gelene Mink, PA-C      Future Appointments            In 2 months Pamella Pert, Lilia Argue, MD Primary Care at [place], [facility]           . atorvastatin (LIPITOR) 80 MG tablet [Pharmacy Med Name: ATORVASTATIN 80 MG TAB[*]] 90 tablet 0    Sig: TAKE ONE TABLET BY MOUTH ONE TIME DAILY AT 6PM     Cardiovascular:  Antilipid - Statins Failed - 08/27/2019  1:25 PM      Failed - Total Cholesterol in normal range and within 360 days    Cholesterol, Total  Date Value Ref Range Status  08/22/2019 200 (H) 100 - 199 mg/dL Final         Failed - LDL in normal range and within 360 days    LDL Chol Calc (NIH)  Date Value Ref Range Status  08/22/2019 77 0 - 99 mg/dL Final  Failed - Triglycerides in normal range and within 360 days    Triglycerides  Date Value Ref Range Status  08/22/2019 374 (H) 0 - 149 mg/dL Final         Passed - HDL in normal range and within 360 days    HDL  Date Value Ref Range Status  08/22/2019 64 >39 mg/dL Final         Passed - Patient is not pregnant      Passed - Valid encounter within last 12 months    Recent Outpatient Visits          5 days ago Type 2 diabetes mellitus with complication, with long-term current use of insulin [facility])   [facility], Lilia Argue, MD   3 months ago Type 2 diabetes mellitus with hyperglycemia, with long-term current use of insulin [facility])   [facility], Lilia Argue, MD   7 months ago Pain of right hip joint   [facility], Lilia Argue, MD   1 year ago Essential hypertension   Primary Care at [facility], Gelene Mink, PA-C   1 year ago Acute gout involving toe of right foot, unspecified cause   Primary Care at [facility], Gelene Mink, PA-C      Future Appointments            In 2 months Pamella Pert, Lilia Argue, MD Primary Care at [place], [facility]

## 2019-08-29 ENCOUNTER — Other Ambulatory Visit: Payer: Self-pay | Admitting: Family Medicine

## 2019-08-29 NOTE — Telephone Encounter (Signed)
Requested medication (s) are due for refill today: yes  Requested medication (s) are on the active medication list: yes  Last refill:  08/08/2018  Future visit scheduled: yes  Notes to clinic:  Review for refill Last filled by Dr. Brigitte Pulse   Requested Prescriptions  Pending Prescriptions Disp Refills   insulin aspart (NOVOLOG FLEXPEN) 100 UNIT/ML FlexPen 30 mL 1    Sig: INJECT 10 UNITS UNDER SKIN PRIOR TO MEALS AND INJECT 5 UNITS UNDER SKIN FOR CBG &gt;250     Endocrinology:  Diabetes - Insulins Failed - 08/29/2019 10:59 AM      Failed - HBA1C is between 0 and 7.9 and within 180 days    Hgb A1c MFr Bld  Date Value Ref Range Status  08/22/2019 8.5 (H) 4.8 - 5.6 % Final    Comment:             Prediabetes: 5.7 - 6.4          Diabetes: >6.4          Glycemic control for adults with diabetes: <7.0          Passed - Valid encounter within last 6 months    Recent Outpatient Visits          1 week ago Type 2 diabetes mellitus with complication, with long-term current use of insulin ([place])   [facility], Lilia Argue, MD   3 months ago Type 2 diabetes mellitus with hyperglycemia, with long-term current use of insulin [facility])   [facility], Lilia Argue, MD   7 months ago Pain of right hip joint   [facility], Lilia Argue, MD   1 year ago Essential hypertension   Primary Care at [facility], Gelene Mink, PA-C   1 year ago Acute gout involving toe of right foot, unspecified cause   Primary Care at [facility], Gelene Mink, PA-C      Future Appointments            In 2 months Pamella Pert, Lilia Argue, MD Primary Care at [place], [facility]

## 2019-08-29 NOTE — Telephone Encounter (Signed)
Copied from [place] [id number]. Topic: Quick Communication - Rx Refill/Question >> Aug 29, 2019  9:51 AM Rainey Pines A wrote: Medication: Novolog (Pharmacy stated that they have sent fax over multiple times and need medication sent back over.)  Has the patient contacted their pharmacy? Yes (Agent: If no, request that the patient contact the pharmacy for the refill.) (Agent: If yes, when and what did the pharmacy advise?)Contact PCP  Preferred Pharmacy (with phone number or street name): [facility] [address] [place], [place]. [phone] (Phone) [phone] (Fax)    Agent: Please be advised that RX refills may take up to 3 business days. We ask that you follow-up with your pharmacy.

## 2019-08-30 DIAGNOSIS — H35033 Hypertensive retinopathy, bilateral: Secondary | ICD-10-CM | POA: Diagnosis not present

## 2019-08-30 DIAGNOSIS — H04123 Dry eye syndrome of bilateral lacrimal glands: Secondary | ICD-10-CM | POA: Diagnosis not present

## 2019-08-30 DIAGNOSIS — H2513 Age-related nuclear cataract, bilateral: Secondary | ICD-10-CM | POA: Diagnosis not present

## 2019-08-30 DIAGNOSIS — H52223 Regular astigmatism, bilateral: Secondary | ICD-10-CM | POA: Diagnosis not present

## 2019-08-30 DIAGNOSIS — E119 Type 2 diabetes mellitus without complications: Secondary | ICD-10-CM | POA: Diagnosis not present

## 2019-08-30 DIAGNOSIS — H5213 Myopia, bilateral: Secondary | ICD-10-CM | POA: Diagnosis not present

## 2019-08-30 DIAGNOSIS — H524 Presbyopia: Secondary | ICD-10-CM | POA: Diagnosis not present

## 2019-09-01 ENCOUNTER — Other Ambulatory Visit: Payer: Self-pay

## 2019-09-01 ENCOUNTER — Telehealth: Payer: Self-pay

## 2019-09-01 MED ORDER — NOVOLOG FLEXPEN 100 UNIT/ML ~~LOC~~ SOPN: 15.0000 [IU] | PEN_INJECTOR | Freq: Three times a day (TID) | SUBCUTANEOUS | 1 refills | Status: DC

## 2019-09-01 NOTE — Telephone Encounter (Signed)
Spoke with pharmacy Tech and the request was for Novolog. Stated that the request has been filled. Marland Kitchenpk/CMA

## 2019-09-01 NOTE — Telephone Encounter (Signed)
Called pt, says she has had to increase her insulin due to increased sugar. Dr. Pamella Pert increased the units to 15u tid with meals. Pt verbalized understanding, no follow up question from pt.

## 2019-09-06 ENCOUNTER — Other Ambulatory Visit: Payer: Self-pay | Admitting: Family Medicine

## 2019-09-06 DIAGNOSIS — Z1231 Encounter for screening mammogram for malignant neoplasm of breast: Secondary | ICD-10-CM | POA: Diagnosis not present

## 2019-09-06 DIAGNOSIS — Z01419 Encounter for gynecological examination (general) (routine) without abnormal findings: Secondary | ICD-10-CM | POA: Diagnosis not present

## 2019-09-06 DIAGNOSIS — I10 Essential (primary) hypertension: Secondary | ICD-10-CM

## 2019-09-06 DIAGNOSIS — Z6835 Body mass index (BMI) 35.0-35.9, adult: Secondary | ICD-10-CM | POA: Diagnosis not present

## 2019-09-06 LAB — HM MAMMOGRAPHY

## 2019-09-06 LAB — HM PAP SMEAR: HM Pap smear: NEGATIVE

## 2019-09-06 MED ORDER — NOVOLOG FLEXPEN 100 UNIT/ML ~~LOC~~ SOPN: PEN_INJECTOR | SUBCUTANEOUS | 1 refills | Status: DC

## 2019-09-08 ENCOUNTER — Other Ambulatory Visit: Payer: Self-pay | Admitting: Family Medicine

## 2019-09-08 ENCOUNTER — Other Ambulatory Visit: Payer: Self-pay | Admitting: Internal Medicine

## 2019-09-08 DIAGNOSIS — I10 Essential (primary) hypertension: Secondary | ICD-10-CM

## 2019-09-08 MED ORDER — TRAMADOL HCL 50 MG PO TABS: ORAL_TABLET | ORAL | 2 refills | Status: DC

## 2019-09-08 NOTE — Telephone Encounter (Signed)
Pt is requesting refill on pended meds. I wanted you to review befor sending. Pt says that she was instructed by un to take insulin in the morning and she is taking 47u. When last spoke to the pt, she was told to take it 15u tid. Pls review. Also, she is asking for a med for back pain

## 2019-09-08 NOTE — Telephone Encounter (Signed)
Forwarding medication refill requests to the clinical pool for review. 

## 2019-09-21 ENCOUNTER — Other Ambulatory Visit: Payer: Self-pay | Admitting: Internal Medicine

## 2019-09-21 NOTE — Telephone Encounter (Signed)
Rx(s) sent to pharmacy electronically.  

## 2019-11-10 ENCOUNTER — Other Ambulatory Visit: Payer: Self-pay | Admitting: Internal Medicine

## 2019-11-10 ENCOUNTER — Other Ambulatory Visit: Payer: Self-pay | Admitting: Family Medicine

## 2019-11-10 DIAGNOSIS — F419 Anxiety disorder, unspecified: Secondary | ICD-10-CM

## 2019-11-10 DIAGNOSIS — F329 Major depressive disorder, single episode, unspecified: Secondary | ICD-10-CM

## 2019-11-10 DIAGNOSIS — F32A Depression, unspecified: Secondary | ICD-10-CM

## 2019-11-10 NOTE — Telephone Encounter (Signed)
Requested Prescriptions  Pending Prescriptions Disp Refills  . CHANTIX 0.5 MG tablet [Pharmacy Med Name: CHANTIX 0.5 MG TAB[**$]] 56 tablet 3    Sig: TAKE ONE TABLET BY MOUTH TWICE A DAY     Psychiatry:  Drug Dependence Therapy Passed - 11/10/2019 11:17 AM      Passed - Valid encounter within last 12 months    Recent Outpatient Visits          2 months ago Type 2 diabetes mellitus with complication, with long-term current use of insulin ([place])   [facility], Lilia Argue, MD   5 months ago Type 2 diabetes mellitus with hyperglycemia, with long-term current use of insulin [facility])   [facility], Lilia Argue, MD   10 months ago Pain of right hip joint   [facility], Lilia Argue, MD   1 year ago Essential hypertension   Primary Care at [facility], Gelene Mink, PA-C   1 year ago Acute gout involving toe of right foot, unspecified cause   Primary Care at [facility], Gelene Mink, PA-C      Future Appointments            In 1 week Rutherford Guys, MD Primary Care at [place], [facility]           . atorvastatin (LIPITOR) 80 MG tablet [Pharmacy Med Name: ATORVASTATIN 80 MG TAB[*]] 90 tablet 0    Sig: TAKE ONE TABLET BY MOUTH ONE TIME DAILY AT 6PM     Cardiovascular:  Antilipid - Statins Failed - 11/10/2019 11:17 AM      Failed - Total Cholesterol in normal range and within 360 days    Cholesterol, Total  Date Value Ref Range Status  08/22/2019 200 (H) 100 - 199 mg/dL Final         Failed - LDL in normal range and within 360 days    LDL Chol Calc (NIH)  Date Value Ref Range Status  08/22/2019 77 0 - 99 mg/dL Final         Failed - Triglycerides in normal range and within 360 days    Triglycerides  Date Value Ref Range Status  08/22/2019 374 (H) 0 - 149 mg/dL Final         Passed - HDL in normal range and within 360 days    HDL  Date Value Ref Range Status  08/22/2019 64 >39 mg/dL Final         Passed - Patient is not  pregnant      Passed - Valid encounter within last 12 months    Recent Outpatient Visits          2 months ago Type 2 diabetes mellitus with complication, with long-term current use of insulin ([place])   [facility], Lilia Argue, MD   5 months ago Type 2 diabetes mellitus with hyperglycemia, with long-term current use of insulin [facility])   [facility], Lilia Argue, MD   10 months ago Pain of right hip joint   [facility], Lilia Argue, MD   1 year ago Essential hypertension   Primary Care at [facility], [place], PA-C   1 year ago Acute gout involving toe of right foot, unspecified cause   Primary Care at [facility], Gelene Mink, PA-C      Future Appointments            In 1  week Rutherford Guys, MD Primary Care at [place], [facility]

## 2019-11-18 ENCOUNTER — Telehealth (INDEPENDENT_AMBULATORY_CARE_PROVIDER_SITE_OTHER): Payer: 59 | Admitting: Family Medicine

## 2019-11-18 DIAGNOSIS — F329 Major depressive disorder, single episode, unspecified: Secondary | ICD-10-CM

## 2019-11-18 DIAGNOSIS — R0981 Nasal congestion: Secondary | ICD-10-CM

## 2019-11-18 DIAGNOSIS — F419 Anxiety disorder, unspecified: Secondary | ICD-10-CM

## 2019-11-18 DIAGNOSIS — E785 Hyperlipidemia, unspecified: Secondary | ICD-10-CM | POA: Diagnosis not present

## 2019-11-18 DIAGNOSIS — F32A Depression, unspecified: Secondary | ICD-10-CM

## 2019-11-18 DIAGNOSIS — E118 Type 2 diabetes mellitus with unspecified complications: Secondary | ICD-10-CM | POA: Diagnosis not present

## 2019-11-18 DIAGNOSIS — M6530 Trigger finger, unspecified finger: Secondary | ICD-10-CM

## 2019-11-18 DIAGNOSIS — Z794 Long term (current) use of insulin: Secondary | ICD-10-CM

## 2019-11-18 NOTE — Progress Notes (Signed)
Pt is currently in [place], any refills she want sent to the preferred pharmacy. Pt is having 3 montth f/u for chronic conditions. She is also having problems with her sinuses. She is stuffy, cannot breathe when lying down. Was told last night that while sleeping she heard gasping for air. The medication that she is taking for allergy is not helping. She is currently in [place] due to her mothers soon to be death

## 2019-11-18 NOTE — Progress Notes (Signed)
Virtual Visit Note  I connected with patient on 11/18/19 at 1112am by video doximity and verified that I am speaking with the correct person using two identifiers. Victoria Eaton is currently located at home and patient is currently with them during visit. The provider, Rutherford Guys, MD is located in their office at time of visit.  I discussed the limitations, risks, security and privacy concerns of performing an evaluation and management service by telephone and the availability of in person appointments. I also discussed with the patient that there may be a patient responsible charge related to this service. The patient expressed understanding and agreed to proceed.   CC: routine followup  HPI  PMH: CAD (MI, stents, CABG), HTN, DM2 on insulin, HLP, CKD 2 2/2 uterine prolapse, smoke, depression  Last OV Nov 2020 - lantus in AM and titrate to fasting goal, increased sertraline to 159m daily, decreased invokana to 1077mdaily due to GFR < 45  [place]  Has increased lantus to 60 units in AM, rare lows into the 5093sith symptoms.  She reports fasting have been "Green" She has also decreased invokana as requested Sometimes forgets her humalog before lunch, has been doing mostly 15 units AC. She has a cgm  She has been tolerating higher dose of sertraline  For months she has been having intense PND, sinus pressure, both sides, feels clogged, her sister noticed that she gasps for airs, she has increased claritin, has not been using flonase, denies any fever or chills  Having trigger finger, right hand, 2nd digit Hurts to drive She is right hand dominant  Lab Results  Component Value Date   HGBA1C 8.5 (H) 08/22/2019   HGBA1C 9.4 (H) 05/23/2019   HGBA1C 7.8 (H) 08/03/2018   Lab Results  Component Value Date   LDLCALC 77 08/22/2019   CREATININE 1.36 (H) 08/22/2019  GFR 42   [facility] 11/18/2019 08/22/2019  05/23/2019  Decreased Interest 1 2 0  Down, Depressed, Hopeless 2 2 0  PHQ - 2 Score 3 4 0  Altered sleeping 0 0 -  Tired, decreased energy 2 3 -  Change in appetite 0 3 -  Feeling bad or failure about yourself  1 3 -  Trouble concentrating 3 3 -  Moving slowly or fidgety/restless 2 0 -  Suicidal thoughts 0 0 -  PHQ-9 Score 11 16 -  Difficult doing work/chores - Somewhat difficult -  Some recent data might be hidden    Allergies  Allergen Reactions  . Januvia [Sitagliptin] Nausea And Vomiting and Other (See Comments)    Pancreatitis (this was in Epic, but patient was unaware of this)   . Metformin And Related Diarrhea  . Penicillins Other (See Comments)    Unknown childhood reaction Has patient had a PCN reaction causing immediate rash, facial/tongue/throat swelling, SOB or lightheadedness with hypotension: Unknown Has patient had a PCN reaction causing severe rash involving mucus membranes or skin necrosis: Unknown Has patient had a PCN reaction that required hospitalization: Unknown Has patient had a PCN reaction occurring within the last 10 years: No If all of the above answers are "NO", then may proceed with Cephalosporin use.     Prior to Admission medications   Medication Sig Start Date End Date Taking? Authorizing Provider  amLODipine (NORVASC) 5 MG tablet TAKE ONE TABLET BY MOUTH ONE TIME DAILY 08/17/19   Hilty, KeNadean CorwinMD  aspirin 81  MG EC tablet Take 1 tablet (81 mg total) by mouth daily. Patient not taking: Reported on 08/22/2019 02/11/18   Ledora Bottcher, PA  atorvastatin (LIPITOR) 80 MG tablet TAKE ONE TABLET BY MOUTH ONE TIME DAILY AT [facility] 11/10/19   Rutherford Guys, MD  buPROPion (WELLBUTRIN XL) 300 MG 24 hr tablet TAKE ONE TABLET BY MOUTH ONE TIME DAILY 11/10/19   Rutherford Guys, MD  canagliflozin [facility]) 100 MG TABS tablet Take 1 tablet (100 mg total) by mouth daily before breakfast. 08/23/19   Rutherford Guys, MD  CHANTIX 0.5 MG tablet TAKE ONE  TABLET BY MOUTH TWICE A DAY 11/10/19   Rutherford Guys, MD  clopidogrel (PLAVIX) 75 MG tablet TAKE ONE TABLET BY MOUTH ONE TIME DAILY 08/22/19   Rutherford Guys, MD  Continuous Blood Gluc Sensor (FREESTYLE LIBRE 14 DAY SENSOR) MISC APPLY ONE SENSOR TO THE BACK OF YOUR UPPER ARM. REPLACE EVERY 14 DAYS. 08/27/19   Rutherford Guys, MD  diclofenac sodium (VOLTAREN) 1 % GEL Apply 4 g topically 4 (four) times daily. 05/23/19   Rutherford Guys, MD  EPINEPHrine (EPIPEN 2-PAK) 0.3 mg/0.3 mL IJ SOAJ injection Inject 0.3 mLs (0.3 mg total) into the muscle once as needed. Patient taking differently: Inject 0.3 mg into the muscle once as needed (severe allergic reaction).  05/20/17   Tenna Delaine D, PA-C  insulin aspart (NOVOLOG FLEXPEN) 100 UNIT/ML FlexPen INJECT 10 UNITS UNDER SKIN PRIOR TO MEALS AND INJECT 5 UNITS UNDER SKIN FOR CBG &gt;250 09/06/19   Rutherford Guys, MD  insulin aspart (NOVOLOG FLEXPEN) 100 UNIT/ML FlexPen Inject 15 Units into the skin 3 (three) times daily with meals. 09/01/19   Rutherford Guys, MD  isosorbide mononitrate (IMDUR) 120 MG 24 hr tablet Take 1 tablet (120 mg total) by mouth daily. 04/05/19   Hilty, Nadean Corwin, MD  lisinopril (ZESTRIL) 5 MG tablet TAKE ONE TABLET BY MOUTH ONE TIME DAILY 09/08/19   Rutherford Guys, MD  loratadine (CLARITIN) 10 MG tablet Take 1 tablet (10 mg total) by mouth daily. 05/01/17   Tenna Delaine D, PA-C  metoprolol tartrate (LOPRESSOR) 50 MG tablet Take 1 tablet (50 mg total) by mouth 2 (two) times daily. 09/21/19   Pixie Casino, MD  Multiple Vitamin (MULTIVITAMIN WITH MINERALS) TABS tablet Take 1 tablet by mouth daily.    [provider]  nitroGLYCERIN (NITROSTAT) 0.4 MG SL tablet Place 1 tablet (0.4 mg total) under the tongue every 5 (five) minutes as needed for chest pain. 08/18/18 08/22/19  Almyra Deforest, PA  Omega-3 Fatty Acids (FISH OIL PO) Take by mouth.    [provider]  ranolazine (RANEXA) 500 MG 12 hr tablet TAKE ONE  TABLET BY MOUTH TWICE A DAY 11/10/19   Hilty, Nadean Corwin, MD  sertraline (ZOLOFT) 100 MG tablet Take 1 tablet (100 mg total) by mouth daily. 08/22/19   Rutherford Guys, MD  TOUJEO MAX SOLOSTAR 300 UNIT/ML SOPN INJECT 55 UNITS UNDER THE SKIN EVERY MORNING 11/10/19   Rutherford Guys, MD  traMADol (ULTRAM) 50 MG tablet TAKE ONE TABLET BY MOUTH EVERY 6 HOURS AS NEEDED FOR UP TO 5 DAYS 09/08/19   Rutherford Guys, MD  TURMERIC PO Take 1 capsule by mouth daily.     [provider]  Flossie Buffy MICRO PEN NEEDLES 32G X 4 MM MISC USE WITH INJECTIONS ONE TIME DAILY 09/08/19   Rutherford Guys, MD    Past Medical History:  Diagnosis Date  . Anxiety   . Arthritis    "maybe in my left foot" (10/14/2017)  . Bladder prolapse, female, acquired 09/14/2017  . Depression   . High cholesterol   . History of stomach ulcers   . Hypertension   . Myocardial infarction ([place]) 12/30/2004  . Tobacco abuse   . Type II diabetes mellitus ([place])     Past Surgical History:  Procedure Laterality Date  . CARDIAC CATHETERIZATION  10/14/2017  . CORONARY ANGIOPLASTY WITH STENT PLACEMENT  12/30/2004   Patient reported  . CORONARY ARTERY BYPASS GRAFT N/A 10/19/2017   Procedure: CORONARY ARTERY BYPASS GRAFTING (CABG) times four using the right saphaneous vein. Harvested endoscopicly and left internal mammary artery.;  Surgeon: Grace Isaac, MD;  Location: [place];  Service: Open Heart Surgery;  Laterality: N/A;  . DILATION AND CURETTAGE OF UTERUS  1980s  . LEFT HEART CATH AND CORONARY ANGIOGRAPHY N/A 10/14/2017   Procedure: LEFT HEART CATH AND CORONARY ANGIOGRAPHY;  Surgeon: Martinique, Peter M, MD;  Location: [place] [facility];  Service: Cardiovascular;  Laterality: N/A;  . LEFT HEART CATH AND CORS/GRAFTS ANGIOGRAPHY N/A 01/01/2018   Procedure: LEFT HEART CATH AND CORS/GRAFTS ANGIOGRAPHY;  Surgeon: Jettie Booze, MD;  Location: [place] [facility];  Service: Cardiovascular;  Laterality: N/A;  . LEFT HEART CATH AND  CORS/GRAFTS ANGIOGRAPHY N/A 08/16/2018   Procedure: LEFT HEART CATH AND CORS/GRAFTS ANGIOGRAPHY;  Surgeon: Nelva Bush, MD;  Location: [place] [facility];  Service: Cardiovascular;  Laterality: N/A;  . PILONIDAL CYST EXCISION  1980s  . TEE WITHOUT CARDIOVERSION N/A 10/19/2017   Procedure: TRANSESOPHAGEAL ECHOCARDIOGRAM (TEE);  Surgeon: Grace Isaac, MD;  Location: [place];  Service: Open Heart Surgery;  Laterality: N/A;    Social History   Tobacco Use  . Smoking status: Current Some Day Smoker    Packs/day: 0.12    Years: 45.00    Pack years: 5.40    Types: Cigarettes    Start date: 05/01/1972    Last attempt to quit: 09/30/2017    Years since quitting: 2.1  . Smokeless tobacco: Never Used  Substance Use Topics  . Alcohol use: Yes    Alcohol/week: 14.0 standard drinks    Types: 14 Glasses of wine per week    Comment: 2 glasses red wine at night    Family History  Problem Relation Age of Onset  . Heart disease Father   . Hyperlipidemia Father   . Hypertension Father     Review of Systems  Constitutional: Negative for chills and fever.  Respiratory: Negative for cough and shortness of breath.   Cardiovascular: Negative for chest pain, palpitations and leg swelling.  Gastrointestinal: Negative for abdominal pain, nausea and vomiting.  per hpi  Objective  Vitals as reported by the patient: none  GEN: AAOx3, NAD HEENT: [place]/AT, pupils are symmetrical, EOMI, non-icteric sclera Resp: breathing comfortably, speaking in full sentences Skin: no rashes noted, no pallor Psych: good eye contact, normal mood and affect   ASSESSMENT and PLAN  1. Type 2 diabetes mellitus with complication, with long-term current use of insulin ([place]) Per reported cbgs improved, labs will be done once she returns from [facility]. Discussed LFM and mgt hypoglycemia - Hemoglobin A1c; Future  2. Hyperlipidemia LDL goal <70 Checking labs, medications will be adjusted as needed.  - Lipid panel;  Future - Comprehensive metabolic panel; Future  3. Anxiety and depression Sign improved, no changes to meds at this time  4. Chronic nasal  congestion No apparent sinus infection, discussed starting nasal steroid and oral decongestant. RTC precautions given  5. Trigger finger, acquired Refer to ortho once back in [place]  FOLLOW-UP: 3 months, prn   The above assessment and management plan was discussed with the patient. The patient verbalized understanding of and has agreed to the management plan. Patient is aware to call the clinic if symptoms persist or worsen. Patient is aware when to return to the clinic for a follow-up visit. Patient educated on when it is appropriate to go to the emergency department.    I provided 16 minutes of non-face-to-face time during this encounter.  Rutherford Guys, MD Primary Care at [facility] [place], [place] [address] Ph.  [phone] Fax [phone]

## 2019-11-21 ENCOUNTER — Telehealth: Payer: Self-pay | Admitting: Internal Medicine

## 2019-11-21 NOTE — Telephone Encounter (Signed)
PA for ranexa submitted via CMM (Key: [id number])

## 2019-11-23 NOTE — Telephone Encounter (Signed)
ranexa has been approved with [facility] until 11/20/2020

## 2019-12-20 ENCOUNTER — Other Ambulatory Visit: Payer: Self-pay | Admitting: Internal Medicine

## 2019-12-20 NOTE — Telephone Encounter (Signed)
Rx(s) sent to pharmacy electronically.  

## 2020-01-01 ENCOUNTER — Other Ambulatory Visit: Payer: Self-pay | Admitting: Family Medicine

## 2020-01-01 NOTE — Telephone Encounter (Signed)
Requested medication (s) are due for refill today: yes  Requested medication (s) are on the active medication list: yes  Last refill:  09/08/19  Future visit scheduled: no  Notes to clinic:  medication not delegated to NT to refill   Requested Prescriptions  Pending Prescriptions Disp Refills   traMADol (ULTRAM) 50 MG tablet [Pharmacy Med Name: TRAMADOL 50 MG TAB[D]] 20 tablet 0    Sig: TAKE ONE TABLET BY MOUTH EVERY 6 HOURS AS NEEDED FOR UP TO 5 DAYS      Not Delegated - Analgesics:  Opioid Agonists Failed - 01/01/2020  2:04 PM      Failed - This refill cannot be delegated      Failed - Urine Drug Screen completed in last 360 days.      Passed - Valid encounter within last 6 months    Recent Outpatient Visits           1 month ago Type 2 diabetes mellitus with complication, with long-term current use of insulin [facility])   [facility], Lilia Argue, MD   4 months ago Type 2 diabetes mellitus with complication, with long-term current use of insulin [facility])   [facility] at [facility], Lilia Argue, MD   7 months ago Type 2 diabetes mellitus with hyperglycemia, with long-term current use of insulin [facility])   [facility] at [facility], Lilia Argue, MD   11 months ago Pain of right hip joint   [facility] at [facility], Lilia Argue, MD   1 year ago Essential hypertension   Primary Care at [facility], Gelene Mink, [place]

## 2020-01-05 ENCOUNTER — Ambulatory Visit: Payer: Self-pay | Attending: Internal Medicine

## 2020-01-05 DIAGNOSIS — Z23 Encounter for immunization: Secondary | ICD-10-CM

## 2020-01-05 NOTE — Progress Notes (Signed)
   Covid-19 Vaccination Clinic  Name:  Journii Nierman    MRN: [id number] DOB: 1960/12/18  01/05/2020  Ms. Sekula was observed post Covid-19 immunization for 15 minutes without incident. She was provided with Vaccine Information Sheet and instruction to access the V-Safe system.   Ms. Citro was instructed to call 911 with any severe reactions post vaccine: [name] Difficulty breathing  . Swelling of face and throat  . A fast heartbeat  . A bad rash all over body  . Dizziness and weakness   Immunizations Administered    Name Date Dose VIS Date Route   Pfizer COVID-19 Vaccine 01/05/2020 12:16 PM 0.3 mL 09/02/2019 Intramuscular   Manufacturer: [place]   Lot: [id number]   [place]: [id number]

## 2020-01-27 ENCOUNTER — Telehealth (INDEPENDENT_AMBULATORY_CARE_PROVIDER_SITE_OTHER): Payer: 59 | Admitting: Family Medicine

## 2020-01-27 ENCOUNTER — Other Ambulatory Visit: Payer: Self-pay

## 2020-01-27 ENCOUNTER — Encounter: Payer: Self-pay | Admitting: Family Medicine

## 2020-01-27 DIAGNOSIS — Z794 Long term (current) use of insulin: Secondary | ICD-10-CM

## 2020-01-27 DIAGNOSIS — F32A Depression, unspecified: Secondary | ICD-10-CM

## 2020-01-27 DIAGNOSIS — M25552 Pain in left hip: Secondary | ICD-10-CM

## 2020-01-27 DIAGNOSIS — G5602 Carpal tunnel syndrome, left upper limb: Secondary | ICD-10-CM

## 2020-01-27 DIAGNOSIS — M65341 Trigger finger, right ring finger: Secondary | ICD-10-CM | POA: Diagnosis not present

## 2020-01-27 DIAGNOSIS — F329 Major depressive disorder, single episode, unspecified: Secondary | ICD-10-CM

## 2020-01-27 DIAGNOSIS — E118 Type 2 diabetes mellitus with unspecified complications: Secondary | ICD-10-CM

## 2020-01-27 DIAGNOSIS — F419 Anxiety disorder, unspecified: Secondary | ICD-10-CM

## 2020-01-27 MED ORDER — SERTRALINE HCL 100 MG PO TABS: 200.0000 mg | ORAL_TABLET | Freq: Every day | ORAL | 1 refills | Status: DC

## 2020-01-27 MED ORDER — TRAMADOL HCL 50 MG PO TABS: ORAL_TABLET | ORAL | 2 refills | Status: DC

## 2020-01-27 NOTE — Patient Instructions (Signed)
° ° ° °  If you have lab work done today you will be contacted with your lab results within the next 2 weeks.  If you have not heard from us then please contact us. The fastest way to get your results is to register for My Chart. ° ° °IF you received an x-ray today, you will receive an invoice from [facility]. Please contact [facility] at [phone] with questions or concerns regarding your invoice.  ° °IF you received labwork today, you will receive an invoice from [facility]. Please contact [facility] at [phone] with questions or concerns regarding your invoice.  ° °Our billing staff will not be able to assist you with questions regarding bills from these companies. ° °You will be contacted with the lab results as soon as they are available. The fastest way to get your results is to activate your My Chart account. Instructions are located on the last page of this paperwork. If you have not heard from us regarding the results in 2 weeks, please contact this office. °  ° ° ° °

## 2020-01-27 NOTE — Progress Notes (Signed)
Virtual Visit Note  I connected with patient on 01/27/20 at 556pm by video and verified that I am speaking with the correct person using two identifiers. Victoria Eaton is currently located at home and patient is currently with them during visit. The provider, Rutherford Guys, MD is located in their office at time of visit.  I discussed the limitations, risks, security and privacy concerns of performing an evaluation and management service by telephone and the availability of in person appointments. I also discussed with the patient that there may be a patient responsible charge related to this service. The patient expressed understanding and agreed to proceed.   I provided 26 minutes of non-face-to-face time during this encounter.  Chief Complaint  Patient presents with  . Hip Pain    x 2 months   . hand numbness    3 fingers on L hand- numb, trigger on R hand   . Depression    phq9/ gad 7  . Diabetes    blood sugar levels    HPI ? PMH: CAD (MI, stents, CABG), HTN, DM2 on insulin, HLP, CKD 2 2/2 uterine prolapse, smoke, depression  Last OV feb 2021 - telemedicine (patient was in [facility])  Back in [place]  Her mother died in 12/31/22 Her husband just got dx with [place] stage 4 and is currently in rehab in [place] after surgery for femur fracture "I am a mess" Has tried to reach out for counseling but has not been called back She cont in sertraline 177m phq9 and gad7 noted  cbgs have been high since she came back to [place] fastings 170s During the day 200s She wears a cgm Continues to struggle with novolog toujeo 60 units and invokana 103mH/o pancreatitis while on januvia in 2019 Denies any recent lows  Left hand 1-3 fingers numbness, dropping objcts Right hand 3rd trigger finger Would like referral  Left hip pain, anterior/lateral  Getting in and out of car is worse Denies any trauma Uses tramadol prn pmp reviewed  GAD 7 : Generalized Anxiety Score 01/27/2020  Nervous, Anxious,  on Edge 3  Control/stop worrying 3  Worry too much - different things 3  Trouble relaxing 3  Restless 2  Easily annoyed or irritable 3  Afraid - awful might happen 3  Total GAD 7 Score 20  Anxiety Difficulty Very difficult     [facility] 01/27/2020 11/18/2019 08/22/2019  Decreased Interest 3 1 2   Down, Depressed, Hopeless 3 2 2   PHQ - 2 Score 6 3 4   Altered sleeping 3 0 0  Tired, decreased energy 3 2 3   Change in appetite 2 0 3  Feeling bad or failure about yourself  3 1 3   Trouble concentrating 3 3 3   Moving slowly or fidgety/restless 2 2 0  Suicidal thoughts 0 0 0  PHQ-9 Score 22 11 16   Difficult doing work/chores Very difficult - Somewhat difficult  Some recent data might be hidden    Lab Results  Component Value Date   HGBA1C 8.5 (H) 08/22/2019   HGBA1C 9.4 (H) 05/23/2019   HGBA1C 7.8 (H) 08/03/2018   Lab Results  Component Value Date   LDLCALC 77 08/22/2019   CREATININE 1.36 (H) 08/22/2019    Allergies  Allergen Reactions  . Januvia [Sitagliptin] Nausea And Vomiting and Other (See Comments)    Pancreatitis (this was in Epic, but patient was unaware of this)   . Metformin And Related Diarrhea  . Penicillins Other (See  Comments)    Unknown childhood reaction Has patient had a PCN reaction causing immediate rash, facial/tongue/throat swelling, SOB or lightheadedness with hypotension: Unknown Has patient had a PCN reaction causing severe rash involving mucus membranes or skin necrosis: Unknown Has patient had a PCN reaction that required hospitalization: Unknown Has patient had a PCN reaction occurring within the last 10 years: No If all of the above answers are "NO", then may proceed with Cephalosporin use.     Prior to Admission medications   Medication Sig Start Date End Date Taking? Authorizing Provider  amLODipine (NORVASC) 5 MG tablet TAKE ONE TABLET BY MOUTH ONE TIME DAILY 08/17/19  Yes Hilty, Nadean Corwin, MD  aspirin 81 MG EC tablet Take 1  tablet (81 mg total) by mouth daily. 02/11/18  Yes Duke, Tami Lin, PA  atorvastatin (LIPITOR) 80 MG tablet TAKE ONE TABLET BY MOUTH ONE TIME DAILY AT 6PM 11/10/19  Yes Rutherford Guys, MD  buPROPion (WELLBUTRIN XL) 300 MG 24 hr tablet TAKE ONE TABLET BY MOUTH ONE TIME DAILY 11/10/19  Yes Rutherford Guys, MD  canagliflozin [facility]) 100 MG TABS tablet Take 1 tablet (100 mg total) by mouth daily before breakfast. 08/23/19  Yes Rutherford Guys, MD  CHANTIX 0.5 MG tablet TAKE ONE TABLET BY MOUTH TWICE A DAY 11/10/19  Yes Rutherford Guys, MD  clopidogrel (PLAVIX) 75 MG tablet TAKE ONE TABLET BY MOUTH ONE TIME DAILY 08/22/19  Yes Rutherford Guys, MD  Continuous Blood Gluc Sensor (FREESTYLE LIBRE 14 DAY SENSOR) MISC APPLY ONE SENSOR TO THE BACK OF YOUR UPPER ARM. REPLACE EVERY 14 DAYS. 08/27/19  Yes Rutherford Guys, MD  diclofenac sodium (VOLTAREN) 1 % GEL Apply 4 g topically 4 (four) times daily. 05/23/19  Yes Rutherford Guys, MD  EPINEPHrine (EPIPEN 2-PAK) 0.3 mg/0.3 mL IJ SOAJ injection Inject 0.3 mLs (0.3 mg total) into the muscle once as needed. Patient taking differently: Inject 0.3 mg into the muscle once as needed (severe allergic reaction).  05/20/17  Yes Timmothy Euler, [place] D, PA-C  insulin aspart (NOVOLOG FLEXPEN) 100 UNIT/ML FlexPen INJECT 10 UNITS UNDER SKIN PRIOR TO MEALS AND INJECT 5 UNITS UNDER SKIN FOR CBG &gt;250 09/06/19  Yes Rutherford Guys, MD  insulin aspart (NOVOLOG FLEXPEN) 100 UNIT/ML FlexPen Inject 15 Units into the skin 3 (three) times daily with meals. 09/01/19  Yes Rutherford Guys, MD  isosorbide mononitrate (IMDUR) 120 MG 24 hr tablet Take 1 tablet (120 mg total) by mouth daily. 04/05/19  Yes Hilty, Nadean Corwin, MD  lisinopril (ZESTRIL) 5 MG tablet TAKE ONE TABLET BY MOUTH ONE TIME DAILY 09/08/19  Yes Rutherford Guys, MD  loratadine (CLARITIN) 10 MG tablet Take 1 tablet (10 mg total) by mouth daily. 05/01/17  Yes Timmothy Euler, [place] D, PA-C  metoprolol tartrate (LOPRESSOR) 50 MG  tablet Take 1 tablet (50 mg total) by mouth 2 (two) times daily. 09/21/19  Yes Hilty, Nadean Corwin, MD  Multiple Vitamin (MULTIVITAMIN WITH MINERALS) TABS tablet Take 1 tablet by mouth daily.   Yes [provider]  Omega-3 Fatty Acids (FISH OIL PO) Take by mouth.   Yes [provider]  ranolazine (RANEXA) 500 MG 12 hr tablet TAKE ONE TABLET BY MOUTH TWICE A DAY 12/20/19  Yes Hilty, Nadean Corwin, MD  sertraline (ZOLOFT) 100 MG tablet Take 1 tablet (100 mg total) by mouth daily. 08/22/19  Yes Rutherford Guys, MD  TOUJEO MAX SOLOSTAR 300 UNIT/ML SOPN INJECT 55 UNITS UNDER THE SKIN  EVERY MORNING 11/10/19  Yes Rutherford Guys, MD  traMADol (ULTRAM) 50 MG tablet TAKE ONE TABLET BY MOUTH EVERY 6 HOURS AS NEEDED FOR UP TO 5 DAYS 09/08/19  Yes Rutherford Guys, MD  TURMERIC PO Take 1 capsule by mouth daily.    Yes [provider]  Flossie Buffy MICRO PEN NEEDLES 32G X 4 MM MISC USE WITH INJECTIONS ONE TIME DAILY 09/08/19  Yes Rutherford Guys, MD  nitroGLYCERIN (NITROSTAT) 0.4 MG SL tablet Place 1 tablet (0.4 mg total) under the tongue every 5 (five) minutes as needed for chest pain. 08/18/18 08/22/19  Almyra Deforest, PA    Past Medical History:  Diagnosis Date  . Anxiety   . Arthritis    "maybe in my left foot" (10/14/2017)  . Bladder prolapse, female, acquired 09/14/2017  . Depression   . High cholesterol   . History of stomach ulcers   . Hypertension   . Myocardial infarction ([place]) 12/30/2004  . Tobacco abuse   . Type II diabetes mellitus ([place])     Past Surgical History:  Procedure Laterality Date  . CARDIAC CATHETERIZATION  10/14/2017  . CORONARY ANGIOPLASTY WITH STENT PLACEMENT  12/30/2004   Patient reported  . CORONARY ARTERY BYPASS GRAFT N/A 10/19/2017   Procedure: CORONARY ARTERY BYPASS GRAFTING (CABG) times four using the right saphaneous vein. Harvested endoscopicly and left internal mammary artery.;  Surgeon: Grace Isaac, MD;  Location: [facility];  Service: Open Heart  Surgery;  Laterality: N/A;  . DILATION AND CURETTAGE OF UTERUS  1980s  . LEFT HEART CATH AND CORONARY ANGIOGRAPHY N/A 10/14/2017   Procedure: LEFT HEART CATH AND CORONARY ANGIOGRAPHY;  Surgeon: Martinique, Peter M, MD;  Location: [place] [facility];  Service: Cardiovascular;  Laterality: N/A;  . LEFT HEART CATH AND CORS/GRAFTS ANGIOGRAPHY N/A 01/01/2018   Procedure: LEFT HEART CATH AND CORS/GRAFTS ANGIOGRAPHY;  Surgeon: Jettie Booze, MD;  Location: [place] [facility];  Service: Cardiovascular;  Laterality: N/A;  . LEFT HEART CATH AND CORS/GRAFTS ANGIOGRAPHY N/A 08/16/2018   Procedure: LEFT HEART CATH AND CORS/GRAFTS ANGIOGRAPHY;  Surgeon: Nelva Bush, MD;  Location: [place] [facility];  Service: Cardiovascular;  Laterality: N/A;  . PILONIDAL CYST EXCISION  1980s  . TEE WITHOUT CARDIOVERSION N/A 10/19/2017   Procedure: TRANSESOPHAGEAL ECHOCARDIOGRAM (TEE);  Surgeon: Grace Isaac, MD;  Location: [facility];  Service: Open Heart Surgery;  Laterality: N/A;    Social History   Tobacco Use  . Smoking status: Current Some Day Smoker    Packs/day: 0.12    Years: 45.00    Pack years: 5.40    Types: Cigarettes    Start date: 05/01/1972    Last attempt to quit: 09/30/2017    Years since quitting: 2.3  . Smokeless tobacco: Never Used  Substance Use Topics  . Alcohol use: Yes    Alcohol/week: 14.0 standard drinks    Types: 14 Glasses of wine per week    Comment: 2 glasses red wine at night    Family History  Problem Relation Age of Onset  . Heart disease Father   . Hyperlipidemia Father   . Hypertension Father     ROS Per hpi  Objective  Vitals as reported by the patient: none  GEN: AAOx3, NAD HEENT: [place]/AT, pupils are symmetrical, EOMI, non-icteric sclera Resp: breathing comfortably, speaking in full sentences Skin: no rashes noted, no pallor Psych: good eye contact, normal mood and affect  ASSESSMENT and PLAN  1. Type 2 diabetes mellitus  with complication, with long-term  current use of insulin (HCC) Not at goal. Titrate toujeo to fasting goal 130. Cont working on [employer] use.   2. Anxiety and depression Not well controlled. Grief recent triggers. Increasing sertraline given severity of sx. Referring to counseling.  - Ambulatory referral to Psychology  3. Trigger ring finger of right hand - Ambulatory referral to Hand Surgery  4. Carpal tunnel syndrome of left wrist - Ambulatory referral to Hand Surgery  5. Left hip pain uanble to properly assess via video. Tramadol prn for pain. Re-eval at next OV  Other orders - sertraline (ZOLOFT) 100 MG tablet; Take 2 tablets (200 mg total) by mouth daily. - traMADol (ULTRAM) 50 MG tablet; TAKE ONE TABLET BY MOUTH EVERY 6 HOURS AS NEEDED FOR UP TO 5 DAYS  FOLLOW-UP: 4 weeks (in office)   The above assessment and management plan was discussed with the patient. The patient verbalized understanding of and has agreed to the management plan. Patient is aware to call the clinic if symptoms persist or worsen. Patient is aware when to return to the clinic for a follow-up visit. Patient educated on when it is appropriate to go to the emergency department.     Rutherford Guys, MD Primary Care at [place], [place] [address] Ph.  [phone] Fax [phone]

## 2020-01-30 ENCOUNTER — Other Ambulatory Visit: Payer: Self-pay

## 2020-01-30 MED ORDER — NITROGLYCERIN 0.4 MG SL SUBL: 0.4000 mg | SUBLINGUAL_TABLET | SUBLINGUAL | 3 refills | Status: DC | PRN

## 2020-02-01 ENCOUNTER — Ambulatory Visit: Payer: Self-pay

## 2020-02-06 ENCOUNTER — Other Ambulatory Visit: Payer: Self-pay | Admitting: Internal Medicine

## 2020-02-06 ENCOUNTER — Other Ambulatory Visit: Payer: Self-pay | Admitting: Family Medicine

## 2020-02-06 DIAGNOSIS — F419 Anxiety disorder, unspecified: Secondary | ICD-10-CM

## 2020-02-06 DIAGNOSIS — F32A Depression, unspecified: Secondary | ICD-10-CM

## 2020-02-06 NOTE — Telephone Encounter (Signed)
Requested Prescriptions  Pending Prescriptions Disp Refills  . buPROPion (WELLBUTRIN XL) 300 MG 24 hr tablet [Pharmacy Med Name: BUPROPION XL 300 MG TAB] 90 tablet 0    Sig: TAKE ONE TABLET BY MOUTH ONE TIME DAILY     Psychiatry: Antidepressants - bupropion Passed - 02/06/2020  9:40 AM      Passed - Completed PHQ-2 or PHQ-9 in the last 360 days.      Passed - Last BP in normal range    BP Readings from Last 1 Encounters:  08/22/19 111/66         Passed - Valid encounter within last 6 months    Recent Outpatient Visits          1 week ago Type 2 diabetes mellitus with complication, with long-term current use of insulin [facility])   Primary Care at [facility], Lilia Argue, MD   2 months ago Type 2 diabetes mellitus with complication, with long-term current use of insulin [facility])   [facility] at [facility], Lilia Argue, MD   5 months ago Type 2 diabetes mellitus with complication, with long-term current use of insulin [facility])   [facility] at [facility], Lilia Argue, MD   8 months ago Type 2 diabetes mellitus with hyperglycemia, with long-term current use of insulin [facility])   [facility] at [facility], Lilia Argue, MD   1 year ago Pain of right hip joint   [facility] at [facility], Lilia Argue, MD

## 2020-02-08 ENCOUNTER — Ambulatory Visit (INDEPENDENT_AMBULATORY_CARE_PROVIDER_SITE_OTHER): Payer: 59 | Admitting: Orthopaedic Surgery

## 2020-02-08 ENCOUNTER — Other Ambulatory Visit: Payer: Self-pay

## 2020-02-08 ENCOUNTER — Encounter: Payer: Self-pay | Admitting: Orthopaedic Surgery

## 2020-02-08 VITALS — Ht 66.0 in | Wt 210.0 lb

## 2020-02-08 DIAGNOSIS — M65331 Trigger finger, right middle finger: Secondary | ICD-10-CM | POA: Diagnosis not present

## 2020-02-08 DIAGNOSIS — G5602 Carpal tunnel syndrome, left upper limb: Secondary | ICD-10-CM

## 2020-02-08 MED ORDER — BUPIVACAINE HCL 0.5 % IJ SOLN
0.3300 mL | INTRAMUSCULAR | Status: AC | PRN
  Administered 2020-02-08: .33 mL

## 2020-02-08 MED ORDER — LIDOCAINE HCL 1 % IJ SOLN
0.3000 mL | INTRAMUSCULAR | Status: AC | PRN
  Administered 2020-02-08: .3 mL

## 2020-02-08 MED ORDER — METHYLPREDNISOLONE ACETATE 40 MG/ML IJ SUSP
13.3300 mg | INTRAMUSCULAR | Status: AC | PRN
  Administered 2020-02-08: 13.33 mg

## 2020-02-08 NOTE — Progress Notes (Signed)
Office Visit Note   [name]           Date of Birth: [birth year]           MRN: [id number] Visit Date: 02/08/2020              Requested by: Rutherford Guys, MD [address] [place],  [place] [address] PCP: Rutherford Guys, MD   Assessment & Plan: Visit Diagnoses:  1. Left carpal tunnel syndrome   2. Trigger finger, right middle finger     Plan: Impression is left carpal tunnel syndrome and right long trigger finger.  We will obtain nerve conduction studies to evaluate the severity of this.  She will follow up with [place] afterwards.  For the trigger finger based on discussion of treatment options she elected to proceed with a cortisone injection which she tolerated well.  She will watch her sugars closely and adjust her insulin doses accordingly.  We did discuss that good diabetic management has a positive effect on the trigger finger.  Follow-Up Instructions: Return if symptoms worsen or fail to improve.   Orders:  Orders Placed This Encounter  Procedures  . Ambulatory referral to Physical Medicine Rehab   No orders of the defined types were placed in this encounter.     Procedures: Hand/UE Inj: R long A1 for trigger finger on 02/08/2020 2:22 PM Indications: pain Details: 25 G needle Medications: 0.3 mL lidocaine 1 %; 0.33 mL bupivacaine 0.5 %; 13.33 mg methylPREDNISolone acetate 40 MG/ML Outcome: tolerated well, no immediate complications Consent was given by the patient. Patient was prepped and draped in the usual sterile fashion.       Clinical Data: No additional findings.   Subjective: Chief Complaint  Patient presents with  . Right Hand - Pain  . Left Hand - Numbness    Victoria Eaton is a 59 year old female who comes in for evaluation of chronic left hand numbness in the thumb index and long fingers.  This is accompanied by nighttime pain.  She is also dropping things out of her hand.  She feels like the hand falls asleep and goes numb.  She also has  had a persistent right long trigger finger for the last 6 months.  Denies any injuries.  She is able to make it trigger on command.  She tried wearing a over-the-counter splint for the trigger finger but this made the condition worse.  She is right-hand dominant and currently unemployed.  Her last A1c was 8.5 5 months ago.   Review of Systems  Constitutional: Negative.   HENT: Negative.   Eyes: Negative.   Respiratory: Negative.   Cardiovascular: Negative.   Endocrine: Negative.   Musculoskeletal: Negative.   Neurological: Negative.   Hematological: Negative.   Psychiatric/Behavioral: Negative.   All other systems reviewed and are negative.    Objective: Vital Signs: Ht 5' 6"  (1.676 m)   Wt 210 lb (95.3 kg)   BMI 33.89 kg/m   Physical Exam Vitals and nursing note reviewed.  Constitutional:      Appearance: She is well-developed.  HENT:     Head: Normocephalic and atraumatic.  Pulmonary:     Effort: Pulmonary effort is normal.  Abdominal:     Palpations: Abdomen is soft.  Musculoskeletal:     Cervical back: Neck supple.  Skin:    General: Skin is warm.     Capillary Refill: Capillary refill takes less than 2 seconds.  Neurological:     Mental Status:  She is alert and oriented to person, place, and time.  Psychiatric:        Behavior: Behavior normal.        Thought Content: Thought content normal.        Judgment: Judgment normal.     Ortho Exam Right hand shows a tender A1 pulley of the long finger accompanied by triggering. Left hand shows negative carpal tunnel compressive signs.  No muscle atrophy. Specialty Comments:  No specialty comments available.  Imaging: No results found.   PMFS History: Patient Active Problem List   Diagnosis Date Noted  . Non-ST elevation (NSTEMI) myocardial infarction ([place]) 02/17/2018  . Type 2 diabetes mellitus with complication, with long-term current use of insulin ([place]) 02/17/2018  . Chest pain 01/01/2018  . Unstable  angina ([place]) 01/01/2018  . Arm contusion, left, initial encounter 11/25/2017  . Contusion of right knee 11/25/2017  . Hx of CABG 10/19/2017  . Epigastric pain   . Pancreatitis 10/10/2017  . Hyperlipidemia LDL goal <70 09/14/2017  . Essential hypertension 05/01/2017  . Anxiety and depression 05/01/2017  . Tobacco abuse 05/01/2017  . History of MI (myocardial infarction) 05/01/2017  . Allergic rhinitis 05/01/2017  . History of arthritis 05/01/2017  . Bladder prolapse, female, acquired 11/09/2013   Past Medical History:  Diagnosis Date  . Anxiety   . Arthritis    "maybe in my left foot" (10/14/2017)  . Bladder prolapse, female, acquired 09/14/2017  . Depression   . High cholesterol   . History of stomach ulcers   . Hypertension   . Myocardial infarction ([place]) 12/30/2004  . Tobacco abuse   . Type II diabetes mellitus (HCC)     Family History  Problem Relation Age of Onset  . Heart disease Father   . Hyperlipidemia Father   . Hypertension Father     Past Surgical History:  Procedure Laterality Date  . CARDIAC CATHETERIZATION  10/14/2017  . CORONARY ANGIOPLASTY WITH STENT PLACEMENT  12/30/2004   Patient reported  . CORONARY ARTERY BYPASS GRAFT N/A 10/19/2017   Procedure: CORONARY ARTERY BYPASS GRAFTING (CABG) times four using the right saphaneous vein. Harvested endoscopicly and left internal mammary artery.;  Surgeon: Grace Isaac, MD;  Location: [facility];  Service: Open Heart Surgery;  Laterality: N/A;  . DILATION AND CURETTAGE OF UTERUS  1980s  . LEFT HEART CATH AND CORONARY ANGIOGRAPHY N/A 10/14/2017   Procedure: LEFT HEART CATH AND CORONARY ANGIOGRAPHY;  Surgeon: Martinique, Peter M, MD;  Location: [facility];  Service: Cardiovascular;  Laterality: N/A;  . LEFT HEART CATH AND CORS/GRAFTS ANGIOGRAPHY N/A 01/01/2018   Procedure: LEFT HEART CATH AND CORS/GRAFTS ANGIOGRAPHY;  Surgeon: Jettie Booze, MD;  Location: [place] [facility];  Service: Cardiovascular;   Laterality: N/A;  . LEFT HEART CATH AND CORS/GRAFTS ANGIOGRAPHY N/A 08/16/2018   Procedure: LEFT HEART CATH AND CORS/GRAFTS ANGIOGRAPHY;  Surgeon: Nelva Bush, MD;  Location: [place] [facility];  Service: Cardiovascular;  Laterality: N/A;  . PILONIDAL CYST EXCISION  1980s  . TEE WITHOUT CARDIOVERSION N/A 10/19/2017   Procedure: TRANSESOPHAGEAL ECHOCARDIOGRAM (TEE);  Surgeon: Grace Isaac, MD;  Location: [place];  Service: Open Heart Surgery;  Laterality: N/A;   Social History   Occupational History  . Not on file  Tobacco Use  . Smoking status: Current Some Day Smoker    Packs/day: 0.12    Years: 45.00    Pack years: 5.40    Types: Cigarettes    Start date: 05/01/1972  Last attempt to quit: 09/30/2017    Years since quitting: 2.3  . Smokeless tobacco: Never Used  Substance and Sexual Activity  . Alcohol use: Yes    Alcohol/week: 14.0 standard drinks    Types: 14 Glasses of wine per week    Comment: 2 glasses red wine at night  . Drug use: No  . Sexual activity: Not Currently

## 2020-02-09 ENCOUNTER — Telehealth: Payer: Self-pay | Admitting: Internal Medicine

## 2020-02-09 NOTE — Telephone Encounter (Signed)
Called patient 02/09/20 to get scheduled for recall appt for Dr. Debara Pickett, left message

## 2020-02-09 NOTE — Telephone Encounter (Signed)
Called patient 02/09/20 to schedule follow up visit from Dr.Hilty's recall list, left message

## 2020-02-23 ENCOUNTER — Other Ambulatory Visit: Payer: Self-pay | Admitting: Internal Medicine

## 2020-02-23 ENCOUNTER — Ambulatory Visit (INDEPENDENT_AMBULATORY_CARE_PROVIDER_SITE_OTHER): Payer: 59 | Admitting: Psychology

## 2020-02-23 DIAGNOSIS — F329 Major depressive disorder, single episode, unspecified: Secondary | ICD-10-CM | POA: Diagnosis not present

## 2020-02-28 ENCOUNTER — Ambulatory Visit (INDEPENDENT_AMBULATORY_CARE_PROVIDER_SITE_OTHER): Payer: 59 | Admitting: Psychology

## 2020-02-28 DIAGNOSIS — F329 Major depressive disorder, single episode, unspecified: Secondary | ICD-10-CM

## 2020-02-28 DIAGNOSIS — F419 Anxiety disorder, unspecified: Secondary | ICD-10-CM

## 2020-03-01 ENCOUNTER — Other Ambulatory Visit: Payer: Self-pay

## 2020-03-01 ENCOUNTER — Ambulatory Visit: Payer: Medicaid Other

## 2020-03-01 DIAGNOSIS — Z794 Long term (current) use of insulin: Secondary | ICD-10-CM

## 2020-03-01 DIAGNOSIS — E118 Type 2 diabetes mellitus with unspecified complications: Secondary | ICD-10-CM

## 2020-03-01 DIAGNOSIS — Z1159 Encounter for screening for other viral diseases: Secondary | ICD-10-CM

## 2020-03-01 DIAGNOSIS — E785 Hyperlipidemia, unspecified: Secondary | ICD-10-CM

## 2020-03-02 LAB — LIPID PANEL
Chol/HDL Ratio: 2.5 ratio (ref 0.0–4.4)
Cholesterol, Total: 183 mg/dL (ref 100–199)
HDL: 74 mg/dL (ref >39)
LDL Chol Calc (NIH): 86 mg/dL (ref 0–99)
Triglycerides: 134 mg/dL (ref 0–149)
VLDL Cholesterol Cal: 23 mg/dL (ref 5–40)

## 2020-03-02 LAB — COMPREHENSIVE METABOLIC PANEL
ALT: 17 IU/L (ref 0–32)
AST: 17 IU/L (ref 0–40)
Albumin/Globulin Ratio: 1.3 (ref 1.2–2.2)
Albumin: 4.2 g/dL (ref 3.8–4.8)
Alkaline Phosphatase: 95 IU/L (ref 48–121)
BUN/Creatinine Ratio: 17 (ref 12–28)
BUN: 21 mg/dL (ref 8–27)
Bilirubin Total: 0.3 mg/dL (ref 0.0–1.2)
CO2: 22 mmol/L (ref 20–29)
Calcium: 9.5 mg/dL (ref 8.7–10.3)
Chloride: 103 mmol/L (ref 96–106)
Creatinine, Ser: 1.26 mg/dL — ABNORMAL HIGH (ref 0.57–1.00)
GFR calc Af Amer: 53 mL/min/{1.73_m2} — ABNORMAL LOW (ref >59)
GFR calc non Af Amer: 46 mL/min/{1.73_m2} — ABNORMAL LOW (ref >59)
Globulin, Total: 3.3 g/dL (ref 1.5–4.5)
Glucose: 179 mg/dL — ABNORMAL HIGH (ref 65–99)
Potassium: 4.8 mmol/L (ref 3.5–5.2)
Sodium: 139 mmol/L (ref 134–144)
Total Protein: 7.5 g/dL (ref 6.0–8.5)

## 2020-03-02 LAB — HEMOGLOBIN A1C
Est. average glucose Bld gHb Est-mCnc: 186 mg/dL
Hgb A1c MFr Bld: 8.1 % — ABNORMAL HIGH (ref 4.8–5.6)

## 2020-03-02 LAB — HEPATITIS C ANTIBODY: Hep C Virus Ab: <0.1 s/co ratio (ref 0.0–0.9)

## 2020-03-05 ENCOUNTER — Other Ambulatory Visit: Payer: Self-pay | Admitting: Family Medicine

## 2020-03-05 DIAGNOSIS — Z794 Long term (current) use of insulin: Secondary | ICD-10-CM

## 2020-03-05 DIAGNOSIS — E118 Type 2 diabetes mellitus with unspecified complications: Secondary | ICD-10-CM

## 2020-03-05 MED ORDER — CANAGLIFLOZIN 100 MG PO TABS: 100.0000 mg | ORAL_TABLET | Freq: Every day | ORAL | 1 refills | Status: DC

## 2020-03-05 NOTE — Addendum Note (Signed)
Addended by: Anastasio Auerbach R on: 03/05/2020 11:51 AM   Modules accepted: Orders

## 2020-03-05 NOTE — Telephone Encounter (Signed)
Pharmacy called in stating they received the form stating denied for medication, due to script being sent in on 5/12, however pharmacy never received that medication. Requesting it be resent as they did not receive refill on 02/01/20.

## 2020-03-08 ENCOUNTER — Other Ambulatory Visit: Payer: Self-pay | Admitting: Family Medicine

## 2020-03-08 MED ORDER — ROSUVASTATIN CALCIUM 40 MG PO TABS: 40.0000 mg | ORAL_TABLET | Freq: Every day | ORAL | 3 refills | Status: DC

## 2020-03-13 ENCOUNTER — Ambulatory Visit (INDEPENDENT_AMBULATORY_CARE_PROVIDER_SITE_OTHER): Payer: 59 | Admitting: Psychology

## 2020-03-13 DIAGNOSIS — F419 Anxiety disorder, unspecified: Secondary | ICD-10-CM | POA: Diagnosis not present

## 2020-03-13 DIAGNOSIS — F329 Major depressive disorder, single episode, unspecified: Secondary | ICD-10-CM | POA: Diagnosis not present

## 2020-03-19 ENCOUNTER — Encounter: Payer: Self-pay | Admitting: Registered Nurse

## 2020-03-19 ENCOUNTER — Other Ambulatory Visit: Payer: Self-pay

## 2020-03-19 ENCOUNTER — Ambulatory Visit (INDEPENDENT_AMBULATORY_CARE_PROVIDER_SITE_OTHER): Payer: 59 | Admitting: Registered Nurse

## 2020-03-19 VITALS — BP 115/62 | HR 68 | Temp 97.3°F | Resp 17 | Ht 66.0 in | Wt 213.2 lb

## 2020-03-19 DIAGNOSIS — K098 Other cysts of oral region, not elsewhere classified: Secondary | ICD-10-CM

## 2020-03-19 DIAGNOSIS — M79601 Pain in right arm: Secondary | ICD-10-CM

## 2020-03-19 MED ORDER — GABAPENTIN 100 MG PO CAPS: 100.0000 mg | ORAL_CAPSULE | Freq: Three times a day (TID) | ORAL | 3 refills | Status: DC

## 2020-03-19 NOTE — Patient Instructions (Signed)
° ° ° °  If you have lab work done today you will be contacted with your lab results within the next 2 weeks.  If you have not heard from us then please contact us. The fastest way to get your results is to register for My Chart. ° ° °IF you received an x-ray today, you will receive an invoice from [facility]. Please contact [facility] at [phone] with questions or concerns regarding your invoice.  ° °IF you received labwork today, you will receive an invoice from [facility]. Please contact [facility] at [phone] with questions or concerns regarding your invoice.  ° °Our billing staff will not be able to assist you with questions regarding bills from these companies. ° °You will be contacted with the lab results as soon as they are available. The fastest way to get your results is to activate your My Chart account. Instructions are located on the last page of this paperwork. If you have not heard from us regarding the results in 2 weeks, please contact this office. °  ° ° ° °

## 2020-03-19 NOTE — Progress Notes (Signed)
Established Patient Office Visit  Subjective:  Patient ID: Victoria Eaton, female    DOB: November 05, 1960  Age: 59 y.o. MRN: [id number]  CC:  Chief Complaint  Patient presents with   Shoulder Pain    Patient states she fainted on 02/10/2020 and ever since then she has been having right shoulder pain down to her elbow. Per patient it started with a pinch but now its severe pain she went to the Urgent care and had a XRAY and nothing was wrong. SHe has been taking extra strength tylenol , benadryl and also some tramadol for pain.    HPI [name] presents for R shoulder pain  BP dropped and she passed out in Late May. Landed on R elbow and shoulder. Was at [facility] when this happened. Was assessed there and at Urgent care - no fx, stable, dc'd. Now still having some sharp pain episodes with constant dull aches in R arm from shoulder to elbow. No trapezius or neck invovelment. Does not affect lower arm. Limited ROM in shoulder. Motion makes this worse.  No other symptoms, feeling well overall.  Unfortunately her husband passed away from cancer on the 13th.  Past Medical History:  Diagnosis Date   Anxiety    Arthritis    "maybe in my left foot" (10/14/2017)   Bladder prolapse, female, acquired 09/14/2017   Depression    High cholesterol    History of stomach ulcers    Hypertension    Myocardial infarction ([place]) 12/30/2004   Tobacco abuse    Type II diabetes mellitus ([place])     Past Surgical History:  Procedure Laterality Date   CARDIAC CATHETERIZATION  10/14/2017   CORONARY ANGIOPLASTY WITH STENT PLACEMENT  12/30/2004   Patient reported   CORONARY ARTERY BYPASS GRAFT N/A 10/19/2017   Procedure: CORONARY ARTERY BYPASS GRAFTING (CABG) times four using the right saphaneous vein. Harvested endoscopicly and left internal mammary artery.;  Surgeon: Grace Isaac, MD;  Location: [facility];  Service: Open Heart Surgery;  Laterality: N/A;   DILATION AND CURETTAGE  OF UTERUS  1980s   LEFT HEART CATH AND CORONARY ANGIOGRAPHY N/A 10/14/2017   Procedure: LEFT HEART CATH AND CORONARY ANGIOGRAPHY;  Surgeon: Martinique, Peter M, MD;  Location: [facility];  Service: Cardiovascular;  Laterality: N/A;   LEFT HEART CATH AND CORS/GRAFTS ANGIOGRAPHY N/A 01/01/2018   Procedure: LEFT HEART CATH AND CORS/GRAFTS ANGIOGRAPHY;  Surgeon: Jettie Booze, MD;  Location: [facility];  Service: Cardiovascular;  Laterality: N/A;   LEFT HEART CATH AND CORS/GRAFTS ANGIOGRAPHY N/A 08/16/2018   Procedure: LEFT HEART CATH AND CORS/GRAFTS ANGIOGRAPHY;  Surgeon: Nelva Bush, MD;  Location: [facility];  Service: Cardiovascular;  Laterality: N/A;   PILONIDAL CYST EXCISION  1980s   TEE WITHOUT CARDIOVERSION N/A 10/19/2017   Procedure: TRANSESOPHAGEAL ECHOCARDIOGRAM (TEE);  Surgeon: Grace Isaac, MD;  Location: [facility];  Service: Open Heart Surgery;  Laterality: N/A;    Family History  Problem Relation Age of Onset   Heart disease Father    Hyperlipidemia Father    Hypertension Father     Social History   Socioeconomic History   Marital status: Married    Spouse name: Glendell Docker   Number of children: 1   Years of education: Not on file   Highest education level: Not on file  Occupational History   Not on file  Tobacco Use   Smoking status: Current Some Day Smoker    Packs/day: 0.12  Years: 45.00    Pack years: 5.40    Types: Cigarettes    Start date: 05/01/1972    Last attempt to quit: 09/30/2017    Years since quitting: 2.4   Smokeless tobacco: Never Used  Vaping Use   Vaping Use: Never used  Substance and Sexual Activity   Alcohol use: Yes    Alcohol/week: 14.0 standard drinks    Types: 14 Glasses of wine per week    Comment: 2 glasses red wine at night   Drug use: No   Sexual activity: Not Currently  Other Topics Concern   Not on file  Social History Narrative   Not on file   Social Determinants of Health    Financial Resource Strain:    Difficulty of Paying Living Expenses:   Food Insecurity:    Worried About Charity fundraiser in the Last Year:    Arboriculturist in the Last Year:   Transportation Needs:    Film/video editor (Medical):    Lack of Transportation (Non-Medical):   Physical Activity:    Days of Exercise per Week:    Minutes of Exercise per Session:   Stress:    Feeling of Stress :   Social Connections:    Frequency of Communication with Friends and Family:    Frequency of Social Gatherings with Friends and Family:    Attends Religious Services:    Active Member of Clubs or Organizations:    Attends Music therapist:    Marital Status:   Intimate Partner Violence:    Fear of Current or Ex-Partner:    Emotionally Abused:    Physically Abused:    Sexually Abused:     Outpatient Medications Prior to Visit  Medication Sig Dispense Refill   amLODipine (NORVASC) 5 MG tablet TAKE ONE TABLET BY MOUTH ONE TIME DAILY 90 tablet 3   aspirin 81 MG EC tablet Take 1 tablet (81 mg total) by mouth daily. 90 tablet 3   buPROPion (WELLBUTRIN XL) 300 MG 24 hr tablet TAKE ONE TABLET BY MOUTH ONE TIME DAILY 90 tablet 0   canagliflozin (INVOKANA) 100 MG TABS tablet Take 1 tablet (100 mg total) by mouth daily before breakfast. 90 tablet 1   CHANTIX 0.5 MG tablet TAKE ONE TABLET BY MOUTH TWICE A DAY 56 tablet 3   clopidogrel (PLAVIX) 75 MG tablet TAKE ONE TABLET BY MOUTH ONE TIME DAILY 90 tablet 3   diclofenac sodium (VOLTAREN) 1 % GEL Apply 4 g topically 4 (four) times daily. 100 g 3   insulin aspart (NOVOLOG FLEXPEN) 100 UNIT/ML FlexPen INJECT 10 UNITS UNDER SKIN PRIOR TO MEALS AND INJECT 5 UNITS UNDER SKIN FOR CBG &gt;250 30 mL 1   insulin aspart (NOVOLOG FLEXPEN) 100 UNIT/ML FlexPen Inject 15 Units into the skin 3 (three) times daily with meals. 45 mL 1   isosorbide mononitrate (IMDUR) 120 MG 24 hr tablet Take 1 tablet (120 mg total) by  mouth daily. 90 tablet 3   lisinopril (ZESTRIL) 5 MG tablet TAKE ONE TABLET BY MOUTH ONE TIME DAILY 30 tablet 2   loratadine (CLARITIN) 10 MG tablet Take 1 tablet (10 mg total) by mouth daily. 90 tablet 3   metoprolol tartrate (LOPRESSOR) 50 MG tablet TAKE ONE TABLET BY MOUTH TWICE A DAY 30 tablet 1   Multiple Vitamin (MULTIVITAMIN WITH MINERALS) TABS tablet Take 1 tablet by mouth daily.     nitroGLYCERIN (NITROSTAT) 0.4 MG SL tablet Place 1 tablet (  0.4 mg total) under the tongue every 5 (five) minutes as needed for chest pain. 25 tablet 3   Omega-3 Fatty Acids (FISH OIL PO) Take by mouth.     ranolazine (RANEXA) 500 MG 12 hr tablet TAKE ONE TABLET BY MOUTH TWICE A DAY 180 tablet 0   rosuvastatin (CRESTOR) 40 MG tablet Take 1 tablet (40 mg total) by mouth daily. 90 tablet 3   sertraline (ZOLOFT) 100 MG tablet Take 2 tablets (200 mg total) by mouth daily. 180 tablet 1   TOUJEO MAX SOLOSTAR 300 UNIT/ML SOPN INJECT 55 UNITS UNDER THE SKIN EVERY MORNING 6 pen 2   traMADol (ULTRAM) 50 MG tablet TAKE ONE TABLET BY MOUTH EVERY 6 HOURS AS NEEDED FOR UP TO 5 DAYS 20 tablet 2   TURMERIC PO Take 1 capsule by mouth daily.      ULTICARE MICRO PEN NEEDLES 32G X 4 MM MISC USE WITH INJECTIONS ONE TIME DAILY 400 each 0   chlorhexidine (PERIDEX) 0.12 % solution SWISH 15 ML UNDILUTED FOR 30 SECONDS, THEN SPIT OUT. USE AFTER BREAKFAST AND BEFORE BEDTIME. OR, USE AS PRESCRIBED.     clindamycin (CLEOCIN) 150 MG capsule TAKE TWO CAPSULES BY MOUTH NOW, THEN TAKE ONE CAPSULE BY MOUTH THREE TIMES A DAY FOR DENTAL INFECTION     Continuous Blood Gluc Sensor (FREESTYLE LIBRE 14 DAY SENSOR) MISC APPLY ONE SENSOR TO THE BACK OF YOUR UPPER ARM. REPLACE EVERY 14 DAYS. (Patient not taking: Reported on 03/19/2020) 62 each 5   EPINEPHrine (EPIPEN 2-PAK) 0.3 mg/0.3 mL IJ SOAJ injection Inject 0.3 mLs (0.3 mg total) into the muscle once as needed. (Patient not taking: Reported on 03/19/2020) 1 Device 1   No  facility-administered medications prior to visit.    Allergies  Allergen Reactions   Januvia [Sitagliptin] Nausea And Vomiting and Other (See Comments)    Pancreatitis (this was in Epic, but patient was unaware of this)    Metformin And Related Diarrhea   Penicillins Other (See Comments)    Unknown childhood reaction Has patient had a PCN reaction causing immediate rash, facial/tongue/throat swelling, SOB or lightheadedness with hypotension: Unknown Has patient had a PCN reaction causing severe rash involving mucus membranes or skin necrosis: Unknown Has patient had a PCN reaction that required hospitalization: Unknown Has patient had a PCN reaction occurring within the last 10 years: No If all of the above answers are "NO", then may proceed with Cephalosporin use.     ROS Review of Systems  Constitutional: Negative.   HENT: Negative.   Eyes: Negative.   Respiratory: Negative.   Cardiovascular: Negative.   Gastrointestinal: Negative.   Endocrine: Negative.   Genitourinary: Negative.   Musculoskeletal: Positive for arthralgias and myalgias. Negative for back pain, gait problem, joint swelling, neck pain and neck stiffness.  Skin: Negative.   Allergic/Immunologic: Negative.   Neurological: Negative.   Hematological: Negative.   Psychiatric/Behavioral: Negative.   All other systems reviewed and are negative.     Objective:    Physical Exam Vitals and nursing note reviewed.  Constitutional:      General: She is not in acute distress.    Appearance: Normal appearance. She is obese. She is not ill-appearing, toxic-appearing or diaphoretic.  Cardiovascular:     Rate and Rhythm: Normal rate and regular rhythm.  Musculoskeletal:        General: Tenderness and signs of injury present. No swelling or deformity.     Right lower leg: No edema.  Left lower leg: No edema.     Comments: Limited ROM in RUE. Positive can emptying. Pain with abduction and flexion  Skin:     General: Skin is warm and dry.     Capillary Refill: Capillary refill takes less than 2 seconds.  Neurological:     General: No focal deficit present.     Mental Status: She is alert and oriented to person, place, and time. Mental status is at baseline.  Psychiatric:        Mood and Affect: Mood normal.        Behavior: Behavior normal.        Thought Content: Thought content normal.        Judgment: Judgment normal.     BP 115/62    Pulse 68    Temp (!) 97.3 F (36.3 C) (Temporal)    Resp 17    Ht 5' 6"  (1.676 m)    Wt 213 lb 3.2 oz (96.7 kg)    SpO2 97%    BMI 34.41 kg/m  Wt Readings from Last 3 Encounters:  03/19/20 213 lb 3.2 oz (96.7 kg)  02/08/20 210 lb (95.3 kg)  08/22/19 212 lb 12.8 oz (96.5 kg)     Health Maintenance Due  Topic Date Due   TETANUS/TDAP  Never done   MAMMOGRAM  05/01/2017   OPHTHALMOLOGY EXAM  06/04/2019    There are no preventive care reminders to display for this patient.  Lab Results  Component Value Date   TSH 2.590 05/23/2019   Lab Results  Component Value Date   WBC 7.0 12/14/2018   HGB 10.9 (L) 12/14/2018   HCT 36.2 12/14/2018   MCV 82.3 12/14/2018   PLT 227 12/14/2018   Lab Results  Component Value Date   NA 139 03/01/2020   K 4.8 03/01/2020   CO2 22 03/01/2020   GLUCOSE 179 (H) 03/01/2020   BUN 21 03/01/2020   CREATININE 1.26 (H) 03/01/2020   BILITOT 0.3 03/01/2020   ALKPHOS 95 03/01/2020   AST 17 03/01/2020   ALT 17 03/01/2020   PROT 7.5 03/01/2020   ALBUMIN 4.2 03/01/2020   CALCIUM 9.5 03/01/2020   ANIONGAP 6 12/14/2018   Lab Results  Component Value Date   CHOL 183 03/01/2020   Lab Results  Component Value Date   HDL 74 03/01/2020   Lab Results  Component Value Date   LDLCALC 86 03/01/2020   Lab Results  Component Value Date   TRIG 134 03/01/2020   Lab Results  Component Value Date   CHOLHDL 2.5 03/01/2020   Lab Results  Component Value Date   HGBA1C 8.1 (H) 03/01/2020      Assessment & Plan:    Problem List Items Addressed This Visit    None    Visit Diagnoses    Right arm pain    -  Primary   Relevant Medications   gabapentin (NEURONTIN) 100 MG capsule   Cyst of mouth       Relevant Orders   CBC With Differential      Meds ordered this encounter  Medications   gabapentin (NEURONTIN) 100 MG capsule    Sig: Take 1 capsule (100 mg total) by mouth 3 (three) times daily.    Dispense:  90 capsule    Refill:  3    Order Specific Question:   Supervising Provider    Answer:   Carlota Raspberry, JEFFREY R [[id number]]    Follow-up: No follow-ups on file.  PLAN  Continue OTCs with tramadol PRN. Add gabapentin 137m PO tid with another before bed if needed  Will refer to ortho - concern for rotator cuff injury  Patient encouraged to call clinic with any questions, comments, or concerns.  RMaximiano Coss NP

## 2020-03-20 LAB — CBC WITH DIFFERENTIAL
Basophils Absolute: 0.1 10*3/uL (ref 0.0–0.2)
Basos: 1 %
EOS (ABSOLUTE): 0.2 10*3/uL (ref 0.0–0.4)
Eos: 3 %
Hematocrit: 39.4 % (ref 34.0–46.6)
Hemoglobin: 12 g/dL (ref 11.1–15.9)
Immature Grans (Abs): 0.1 10*3/uL (ref 0.0–0.1)
Immature Granulocytes: 1 %
Lymphocytes Absolute: 1.7 10*3/uL (ref 0.7–3.1)
Lymphs: 24 %
MCH: 25.1 pg — ABNORMAL LOW (ref 26.6–33.0)
MCHC: 30.5 g/dL — ABNORMAL LOW (ref 31.5–35.7)
MCV: 82 fL (ref 79–97)
Monocytes Absolute: 0.5 10*3/uL (ref 0.1–0.9)
Monocytes: 7 %
Neutrophils Absolute: 4.6 10*3/uL (ref 1.4–7.0)
Neutrophils: 64 %
RBC: 4.78 x10E6/uL (ref 3.77–5.28)
RDW: 17.3 % — ABNORMAL HIGH (ref 11.7–15.4)
WBC: 7.1 10*3/uL (ref 3.4–10.8)

## 2020-03-27 ENCOUNTER — Ambulatory Visit: Payer: 59 | Admitting: Psychology

## 2020-03-27 ENCOUNTER — Other Ambulatory Visit: Payer: Self-pay | Admitting: Internal Medicine

## 2020-03-28 ENCOUNTER — Ambulatory Visit (INDEPENDENT_AMBULATORY_CARE_PROVIDER_SITE_OTHER): Payer: 59 | Admitting: Psychology

## 2020-03-28 DIAGNOSIS — F329 Major depressive disorder, single episode, unspecified: Secondary | ICD-10-CM

## 2020-03-28 DIAGNOSIS — F419 Anxiety disorder, unspecified: Secondary | ICD-10-CM | POA: Diagnosis not present

## 2020-03-29 ENCOUNTER — Ambulatory Visit: Payer: 59 | Admitting: Orthopaedic Surgery

## 2020-04-03 ENCOUNTER — Encounter: Payer: Self-pay | Admitting: Orthopaedic Surgery

## 2020-04-03 ENCOUNTER — Ambulatory Visit (INDEPENDENT_AMBULATORY_CARE_PROVIDER_SITE_OTHER): Payer: 59 | Admitting: Orthopaedic Surgery

## 2020-04-03 ENCOUNTER — Ambulatory Visit (INDEPENDENT_AMBULATORY_CARE_PROVIDER_SITE_OTHER): Payer: 59

## 2020-04-03 VITALS — Ht 66.0 in | Wt 211.0 lb

## 2020-04-03 DIAGNOSIS — G8929 Other chronic pain: Secondary | ICD-10-CM

## 2020-04-03 DIAGNOSIS — M25511 Pain in right shoulder: Secondary | ICD-10-CM

## 2020-04-03 MED ORDER — METHYLPREDNISOLONE ACETATE 40 MG/ML IJ SUSP
40.0000 mg | INTRAMUSCULAR | Status: AC | PRN
  Administered 2020-04-03: 40 mg via INTRA_ARTICULAR

## 2020-04-03 MED ORDER — LIDOCAINE HCL 2 % IJ SOLN
2.0000 mL | INTRAMUSCULAR | Status: AC | PRN
  Administered 2020-04-03: 2 mL

## 2020-04-03 MED ORDER — BUPIVACAINE HCL 0.25 % IJ SOLN
2.0000 mL | INTRAMUSCULAR | Status: AC | PRN
  Administered 2020-04-03: 2 mL via INTRA_ARTICULAR

## 2020-04-03 NOTE — Progress Notes (Signed)
Office Visit Note   [name]           Date of Birth: March 07, 1961           MRN: [id number] Visit Date: 04/03/2020              Requested by: Maximiano Coss, NP [place],  [place] [address] PCP: Rutherford Guys, MD   Assessment & Plan: Visit Diagnoses:  1. Chronic right shoulder pain     Plan: Impression is right shoulder rotator cuff tendinitis.  We have injected the right shoulder subacromial space with cortisone today.  If she does not have significant relief of symptoms over the next 4 weeks she will call us we will order an MRI to assess her rotator cuff.  Call with concerns or questions in the meantime.  Follow-Up Instructions: Return if symptoms worsen or fail to improve.   Orders:  Orders Placed This Encounter  Procedures  . Large Joint Inj: R subacromial bursa  . XR Shoulder Right   No orders of the defined types were placed in this encounter.     Procedures: Large Joint Inj: R subacromial bursa on 04/03/2020 2:28 PM Indications: pain Details: 22 G needle Medications: 2 mL lidocaine 2 %; 2 mL bupivacaine 0.25 %; 40 mg methylPREDNISolone acetate 40 MG/ML Outcome: tolerated well, no immediate complications Patient was prepped and draped in the usual sterile fashion.       Clinical Data: No additional findings.   Subjective: Chief Complaint  Patient presents with  . Right Shoulder - Pain    DOI 02-10-20    HPI patient is a pleasant 59 year old female who comes in today with an injury to her right shoulder.  On 02/10/2020, she became hypotensive falling on her right side.  She has had significant pain to the right shoulder which is progressively worsened since the injury.  X-rays were obtained on 523 which were negative for fracture.  She comes in today for further evaluation treatment recommendation.  Pain she has primarily to the top of the shoulder and into the deltoid.  She has associated weakness.  Bring her arm across her body as  well as internal rotation of the shoulder seems to worsen her symptoms.  She has tried gabapentin and tramadol without significant relief of symptoms.  No numbness, tingling or burning.  Review of Systems as detailed in HPI.  All others reviewed and are negative.   Objective: Vital Signs: Ht 5' 6"  (1.676 m)   Wt 211 lb (95.7 kg)   BMI 34.06 kg/m   Physical Exam well-developed well-nourished female no acute distress.  Alert and oriented x3.  Ortho Exam examination of the right shoulder reveals approximately 80% range of motion all planes.  She does have internal rotation to the back pocket.  Positive empty can and cross body adduction.  Negative empty can.  She is neurovascular intact distally.  Specialty Comments:  No specialty comments available.  Imaging: XR Shoulder Right  Result Date: 04/03/2020 Degenerative changes to the [facility] and glenohumeral joints.  No fracture noted    PMFS History: Patient Active Problem List   Diagnosis Date Noted  . Non-ST elevation (NSTEMI) myocardial infarction ([place]) 02/17/2018  . Type 2 diabetes mellitus with complication, with long-term current use of insulin ([place]) 02/17/2018  . Chest pain 01/01/2018  . Unstable angina ([place]) 01/01/2018  . Arm contusion, left, initial encounter 11/25/2017  . Contusion of right knee 11/25/2017  . Hx of CABG 10/19/2017  .  Epigastric pain   . Pancreatitis 10/10/2017  . Hyperlipidemia LDL goal <70 09/14/2017  . Essential hypertension 05/01/2017  . Anxiety and depression 05/01/2017  . Tobacco abuse 05/01/2017  . History of MI (myocardial infarction) 05/01/2017  . Allergic rhinitis 05/01/2017  . History of arthritis 05/01/2017  . Bladder prolapse, female, acquired 11/09/2013   Past Medical History:  Diagnosis Date  . Anxiety   . Arthritis    "maybe in my left foot" (10/14/2017)  . Bladder prolapse, female, acquired 09/14/2017  . Depression   . High cholesterol   . History of stomach ulcers   . Hypertension    . Myocardial infarction ([place]) 12/30/2004  . Tobacco abuse   . Type II diabetes mellitus ([facility])     Family History  Problem Relation Age of Onset  . Heart disease Father   . Hyperlipidemia Father   . Hypertension Father     Past Surgical History:  Procedure Laterality Date  . CARDIAC CATHETERIZATION  10/14/2017  . CORONARY ANGIOPLASTY WITH STENT PLACEMENT  12/30/2004   Patient reported  . CORONARY ARTERY BYPASS GRAFT N/A 10/19/2017   Procedure: CORONARY ARTERY BYPASS GRAFTING (CABG) times four using the right saphaneous vein. Harvested endoscopicly and left internal mammary artery.;  Surgeon: Grace Isaac, MD;  Location: [facility];  Service: Open Heart Surgery;  Laterality: N/A;  . DILATION AND CURETTAGE OF UTERUS  1980s  . LEFT HEART CATH AND CORONARY ANGIOGRAPHY N/A 10/14/2017   Procedure: LEFT HEART CATH AND CORONARY ANGIOGRAPHY;  Surgeon: Martinique, Peter M, MD;  Location: [facility];  Service: Cardiovascular;  Laterality: N/A;  . LEFT HEART CATH AND CORS/GRAFTS ANGIOGRAPHY N/A 01/01/2018   Procedure: LEFT HEART CATH AND CORS/GRAFTS ANGIOGRAPHY;  Surgeon: Jettie Booze, MD;  Location: [facility];  Service: Cardiovascular;  Laterality: N/A;  . LEFT HEART CATH AND CORS/GRAFTS ANGIOGRAPHY N/A 08/16/2018   Procedure: LEFT HEART CATH AND CORS/GRAFTS ANGIOGRAPHY;  Surgeon: Nelva Bush, MD;  Location: [facility];  Service: Cardiovascular;  Laterality: N/A;  . PILONIDAL CYST EXCISION  1980s  . TEE WITHOUT CARDIOVERSION N/A 10/19/2017   Procedure: TRANSESOPHAGEAL ECHOCARDIOGRAM (TEE);  Surgeon: Grace Isaac, MD;  Location: [facility];  Service: Open Heart Surgery;  Laterality: N/A;   Social History   Occupational History  . Not on file  Tobacco Use  . Smoking status: Current Some Day Smoker    Packs/day: 0.12    Years: 45.00    Pack years: 5.40    Types: Cigarettes    Start date: 05/01/1972    Last attempt to quit: 09/30/2017    Years since quitting:  2.5  . Smokeless tobacco: Never Used  Vaping Use  . Vaping Use: Never used  Substance and Sexual Activity  . Alcohol use: Yes    Alcohol/week: 14.0 standard drinks    Types: 14 Glasses of wine per week    Comment: 2 glasses red wine at night  . Drug use: No  . Sexual activity: Not Currently

## 2020-04-04 ENCOUNTER — Other Ambulatory Visit: Payer: Self-pay | Admitting: Family Medicine

## 2020-04-04 DIAGNOSIS — I10 Essential (primary) hypertension: Secondary | ICD-10-CM

## 2020-04-06 ENCOUNTER — Encounter: Payer: 59 | Admitting: Physical Medicine and Rehabilitation

## 2020-04-06 ENCOUNTER — Telehealth: Payer: Self-pay | Admitting: Physical Medicine and Rehabilitation

## 2020-04-06 NOTE — Telephone Encounter (Signed)
Patient called advised she need to reschedule her appointment today. The number to contact patient is [phone]

## 2020-04-06 NOTE — Telephone Encounter (Signed)
Called patient to reschedule. No answer and voicemail not set up.

## 2020-04-10 ENCOUNTER — Ambulatory Visit: Payer: 59 | Admitting: Psychology

## 2020-04-16 ENCOUNTER — Other Ambulatory Visit: Payer: Self-pay

## 2020-04-16 ENCOUNTER — Ambulatory Visit (INDEPENDENT_AMBULATORY_CARE_PROVIDER_SITE_OTHER): Payer: 59 | Admitting: Family Medicine

## 2020-04-16 ENCOUNTER — Encounter: Payer: Self-pay | Admitting: Family Medicine

## 2020-04-16 VITALS — BP 112/66 | HR 60 | Temp 97.7°F | Ht 66.0 in | Wt 215.0 lb

## 2020-04-16 DIAGNOSIS — Z794 Long term (current) use of insulin: Secondary | ICD-10-CM

## 2020-04-16 DIAGNOSIS — F419 Anxiety disorder, unspecified: Secondary | ICD-10-CM

## 2020-04-16 DIAGNOSIS — Z23 Encounter for immunization: Secondary | ICD-10-CM

## 2020-04-16 DIAGNOSIS — F329 Major depressive disorder, single episode, unspecified: Secondary | ICD-10-CM

## 2020-04-16 DIAGNOSIS — I10 Essential (primary) hypertension: Secondary | ICD-10-CM

## 2020-04-16 DIAGNOSIS — F32A Depression, unspecified: Secondary | ICD-10-CM

## 2020-04-16 DIAGNOSIS — E118 Type 2 diabetes mellitus with unspecified complications: Secondary | ICD-10-CM | POA: Diagnosis not present

## 2020-04-16 MED ORDER — METHOCARBAMOL 500 MG PO TABS: 500.0000 mg | ORAL_TABLET | Freq: Three times a day (TID) | ORAL | 1 refills | Status: DC | PRN

## 2020-04-16 MED ORDER — TRULICITY 0.75 MG/0.5ML ~~LOC~~ SOAJ
0.7500 mg | SUBCUTANEOUS | 5 refills | Status: DC
Start: 2020-04-16 — End: 2020-11-07

## 2020-04-16 NOTE — Patient Instructions (Signed)
Please checkin about 4 weeks with cbgs, goal would be to increase trulicty and stop invokana

## 2020-04-16 NOTE — Progress Notes (Signed)
7/26/20212:04 PM  Victoria Eaton 20-Dec-1960, 59 y.o., female [id number]  Chief Complaint  Patient presents with  . screening form    insurance [employer]     HPI:   Patient is a 59 y.o. female with past medical history significant for CAD (MI, stents, CABG), HTN, DM2 on insulin, HLP, CKD 2 2/2 uterine prolapse, smoke, depression who presents today for health care insurance form completion  She is also requesting rx for muscle relaxant, her back tends to spasms when she does dishes or standing for prolonged periods of time  toujeo 64 units She would like to try trulcity She is using cgm - avg cbg 24 hours 129 Having recurring low related to novolog use  Started counseling for grief  Lab Results  Component Value Date   HGBA1C 8.1 (H) 03/01/2020   HGBA1C 8.5 (H) 08/22/2019   HGBA1C 9.4 (H) 05/23/2019   Lab Results  Component Value Date   LDLCALC 86 03/01/2020   CREATININE 1.26 (H) 03/01/2020    Depression screen PHQ 2/9 04/16/2020 01/27/2020 11/18/2019  Decreased Interest 1 3 1   Down, Depressed, Hopeless 1 3 2   PHQ - 2 Score 2 6 3   Altered sleeping - 3 0  Tired, decreased energy - 3 2  Change in appetite - 2 0  Feeling bad or failure about yourself  - 3 1  Trouble concentrating - 3 3  Moving slowly or fidgety/restless - 2 2  Suicidal thoughts - 0 0  PHQ-9 Score - 22 11  Difficult doing work/chores - Very difficult -  Some recent data might be hidden    Fall Risk  04/16/2020 08/22/2019 05/23/2019 01/14/2019 08/03/2018  Falls in the past year? 0 0 0 1 1  Number falls in past yr: 0 0 - 0 0  Injury with Fall? 0 0 0 0 1  Comment - - - - fractured arm   Follow up Falls evaluation completed Falls evaluation completed - - -     Allergies  Allergen Reactions  . Januvia [Sitagliptin] Nausea And Vomiting and Other (See Comments)    Pancreatitis (this was in Epic, but patient was unaware of this)   . Metformin And Related Diarrhea  . Penicillins Other (See Comments)      Unknown childhood reaction Has patient had a PCN reaction causing immediate rash, facial/tongue/throat swelling, SOB or lightheadedness with hypotension: Unknown Has patient had a PCN reaction causing severe rash involving mucus membranes or skin necrosis: Unknown Has patient had a PCN reaction that required hospitalization: Unknown Has patient had a PCN reaction occurring within the last 10 years: No If all of the above answers are "NO", then may proceed with Cephalosporin use.     Prior to Admission medications   Medication Sig Start Date End Date Taking? Authorizing Provider  amLODipine (NORVASC) 5 MG tablet TAKE ONE TABLET BY MOUTH ONE TIME DAILY 08/17/19  Yes Hilty, Nadean Corwin, MD  aspirin 81 MG EC tablet Take 1 tablet (81 mg total) by mouth daily. 02/11/18  Yes Duke, Tami Lin, PA  buPROPion (WELLBUTRIN XL) 300 MG 24 hr tablet TAKE ONE TABLET BY MOUTH ONE TIME DAILY 02/06/20  Yes Rutherford Guys, MD  canagliflozin [facility]) 100 MG TABS tablet Take 1 tablet (100 mg total) by mouth daily before breakfast. 03/05/20  Yes Rutherford Guys, MD  CHANTIX 0.5 MG tablet TAKE ONE TABLET BY MOUTH TWICE A DAY 11/10/19  Yes Rutherford Guys, MD  clopidogrel (PLAVIX) 75 MG tablet  TAKE ONE TABLET BY MOUTH ONE TIME DAILY 08/22/19  Yes Rutherford Guys, MD  Continuous Blood Gluc Sensor (FREESTYLE LIBRE 14 DAY SENSOR) MISC APPLY ONE SENSOR TO THE BACK OF YOUR UPPER ARM. REPLACE EVERY 14 DAYS. 08/27/19  Yes Rutherford Guys, MD  diclofenac sodium (VOLTAREN) 1 % GEL Apply 4 g topically 4 (four) times daily. 05/23/19  Yes Rutherford Guys, MD  gabapentin (NEURONTIN) 100 MG capsule Take 1 capsule (100 mg total) by mouth 3 (three) times daily. 03/19/20  Yes Maximiano Coss, NP  insulin aspart (NOVOLOG FLEXPEN) 100 UNIT/ML FlexPen INJECT 10 UNITS UNDER SKIN PRIOR TO MEALS AND INJECT 5 UNITS UNDER SKIN FOR CBG &gt;250 09/06/19  Yes Rutherford Guys, MD  isosorbide mononitrate (IMDUR) 120 MG 24 hr tablet Take 1  tablet (120 mg total) by mouth daily. 04/05/19  Yes Hilty, Nadean Corwin, MD  lisinopril (ZESTRIL) 5 MG tablet TAKE ONE TABLET BY MOUTH ONE TIME DAILY 04/04/20  Yes Rutherford Guys, MD  loratadine (CLARITIN) 10 MG tablet Take 1 tablet (10 mg total) by mouth daily. 05/01/17  Yes Timmothy Euler, [place] D, PA-C  metoprolol tartrate (LOPRESSOR) 50 MG tablet TAKE ONE TABLET BY MOUTH TWICE A DAY 02/23/20  Yes Hilty, Nadean Corwin, MD  Multiple Vitamin (MULTIVITAMIN WITH MINERALS) TABS tablet Take 1 tablet by mouth daily.   Yes [provider]  nitroGLYCERIN (NITROSTAT) 0.4 MG SL tablet Place 1 tablet (0.4 mg total) under the tongue every 5 (five) minutes as needed for chest pain. 01/30/20 04/29/20 Yes Hilty, Nadean Corwin, MD  Omega-3 Fatty Acids (FISH OIL PO) Take by mouth.   Yes [provider]  ranolazine (RANEXA) 500 MG 12 hr tablet TAKE ONE TABLET BY MOUTH TWICE A DAY 02/07/20  Yes Hilty, Nadean Corwin, MD  rosuvastatin (CRESTOR) 40 MG tablet Take 1 tablet (40 mg total) by mouth daily. 03/08/20  Yes Rutherford Guys, MD  sertraline (ZOLOFT) 100 MG tablet Take 2 tablets (200 mg total) by mouth daily. 01/27/20  Yes Rutherford Guys, MD  TOUJEO MAX SOLOSTAR 300 UNIT/ML SOPN INJECT 55 UNITS UNDER THE SKIN EVERY MORNING 11/10/19  Yes Rutherford Guys, MD  traMADol (ULTRAM) 50 MG tablet TAKE ONE TABLET BY MOUTH EVERY 6 HOURS AS NEEDED FOR UP TO 5 DAYS 01/27/20  Yes Rutherford Guys, MD  TURMERIC PO Take 1 capsule by mouth daily.    Yes [provider]  Flossie Buffy MICRO PEN NEEDLES 32G X 4 MM MISC USE WITH INJECTIONS ONE TIME DAILY 09/08/19  Yes Rutherford Guys, MD    Past Medical History:  Diagnosis Date  . Anxiety   . Arthritis    "maybe in my left foot" (10/14/2017)  . Bladder prolapse, female, acquired 09/14/2017  . Depression   . High cholesterol   . History of stomach ulcers   . Hypertension   . Myocardial infarction ([place]) 12/30/2004  . Tobacco abuse   . Type II diabetes mellitus ([place])     Past  Surgical History:  Procedure Laterality Date  . CARDIAC CATHETERIZATION  10/14/2017  . CORONARY ANGIOPLASTY WITH STENT PLACEMENT  12/30/2004   Patient reported  . CORONARY ARTERY BYPASS GRAFT N/A 10/19/2017   Procedure: CORONARY ARTERY BYPASS GRAFTING (CABG) times four using the right saphaneous vein. Harvested endoscopicly and left internal mammary artery.;  Surgeon: Grace Isaac, MD;  Location: [place];  Service: Open Heart Surgery;  Laterality: N/A;  . DILATION AND CURETTAGE OF UTERUS  1980s  .  LEFT HEART CATH AND CORONARY ANGIOGRAPHY N/A 10/14/2017   Procedure: LEFT HEART CATH AND CORONARY ANGIOGRAPHY;  Surgeon: Martinique, Peter M, MD;  Location: [place] [facility];  Service: Cardiovascular;  Laterality: N/A;  . LEFT HEART CATH AND CORS/GRAFTS ANGIOGRAPHY N/A 01/01/2018   Procedure: LEFT HEART CATH AND CORS/GRAFTS ANGIOGRAPHY;  Surgeon: Jettie Booze, MD;  Location: [place] [facility];  Service: Cardiovascular;  Laterality: N/A;  . LEFT HEART CATH AND CORS/GRAFTS ANGIOGRAPHY N/A 08/16/2018   Procedure: LEFT HEART CATH AND CORS/GRAFTS ANGIOGRAPHY;  Surgeon: Nelva Bush, MD;  Location: [place] [facility];  Service: Cardiovascular;  Laterality: N/A;  . PILONIDAL CYST EXCISION  1980s  . TEE WITHOUT CARDIOVERSION N/A 10/19/2017   Procedure: TRANSESOPHAGEAL ECHOCARDIOGRAM (TEE);  Surgeon: Grace Isaac, MD;  Location: [place];  Service: Open Heart Surgery;  Laterality: N/A;    Social History   Tobacco Use  . Smoking status: Current Some Day Smoker    Packs/day: 0.12    Years: 45.00    Pack years: 5.40    Types: Cigarettes    Start date: 05/01/1972    Last attempt to quit: 09/30/2017    Years since quitting: 2.5  . Smokeless tobacco: Never Used  Substance Use Topics  . Alcohol use: Yes    Alcohol/week: 14.0 standard drinks    Types: 14 Glasses of wine per week    Comment: 2 glasses red wine at night    Family History  Problem Relation Age of Onset  . Heart disease Father    . Hyperlipidemia Father   . Hypertension Father     Review of Systems  Constitutional: Negative for chills and fever.  Respiratory: Negative for cough and shortness of breath.   Cardiovascular: Negative for chest pain, palpitations and leg swelling.  Gastrointestinal: Negative for abdominal pain, nausea and vomiting.     OBJECTIVE:  Today's Vitals   04/16/20 1348  BP: 112/66  Pulse: 60  Temp: 97.7 F (36.5 C)  SpO2: 96%  Weight: (!) 215 lb (97.5 kg)  Height: 5' 6"  (1.676 m)   Body mass index is 34.7 kg/m.    Physical Exam Vitals and nursing note reviewed.  Constitutional:      Appearance: She is well-developed.  HENT:     Head: Normocephalic and atraumatic.     Mouth/Throat:     Pharynx: No oropharyngeal exudate.  Eyes:     General: No scleral icterus.    Conjunctiva/sclera: Conjunctivae normal.     Pupils: Pupils are equal, round, and reactive to light.  Cardiovascular:     Rate and Rhythm: Normal rate and regular rhythm.     Heart sounds: Normal heart sounds. No murmur heard.  No friction rub. No gallop.   Pulmonary:     Effort: Pulmonary effort is normal.     Breath sounds: Normal breath sounds. No wheezing or rales.  Musculoskeletal:     Cervical back: Neck supple.  Skin:    General: Skin is warm and dry.  Neurological:     Mental Status: She is alert and oriented to person, place, and time.     No results found for this or any previous visit (from the past 24 hour(s)).  No results found.   ASSESSMENT and PLAN  1. Type 2 diabetes mellitus with complication, with long-term current use of insulin (HCC) Last a1c not at goal. Start trulicity, reviewed r/se/b, incl risk for pancreatitis. Should take place of novolog as hardly ever uses. Consider increasing  to full dose and d/c invokana  2. Essential hypertension Controlled. Continue current regime.   3. Anxiety and depression Stable. Cont counseling for grief  4. Need for prophylactic  vaccination with tetanus toxoid alone  Other orders - methocarbamol (ROBAXIN) 500 MG tablet; Take 1 tablet (500 mg total) by mouth every 8 (eight) hours as needed for muscle spasms. - Dulaglutide (TRULICITY) 0.63 GZ/4.9SI SOPN; Inject 0.5 mLs (0.75 mg total) into the skin once a week. - Td vaccine greater than or equal to 7yo preservative free IM  Return in about 3 months (around 07/17/2020).    Rutherford Guys, MD Primary Care at [place],  [id number] Ph.  [phone] Fax [phone]

## 2020-04-16 NOTE — Telephone Encounter (Signed)
[facility] states ms Benthall is not they Pt please Advice

## 2020-04-17 ENCOUNTER — Encounter: Payer: Self-pay | Admitting: Internal Medicine

## 2020-04-17 ENCOUNTER — Ambulatory Visit (INDEPENDENT_AMBULATORY_CARE_PROVIDER_SITE_OTHER): Payer: 59 | Admitting: Internal Medicine

## 2020-04-17 ENCOUNTER — Telehealth: Payer: Self-pay | Admitting: Physical Medicine and Rehabilitation

## 2020-04-17 ENCOUNTER — Encounter: Payer: Self-pay | Admitting: Family Medicine

## 2020-04-17 ENCOUNTER — Telehealth: Payer: Self-pay | Admitting: Family Medicine

## 2020-04-17 VITALS — BP 115/60 | HR 65 | Temp 97.0°F | Ht 66.0 in | Wt 216.0 lb

## 2020-04-17 DIAGNOSIS — I25708 Atherosclerosis of coronary artery bypass graft(s), unspecified, with other forms of angina pectoris: Secondary | ICD-10-CM

## 2020-04-17 DIAGNOSIS — Z951 Presence of aortocoronary bypass graft: Secondary | ICD-10-CM | POA: Diagnosis not present

## 2020-04-17 DIAGNOSIS — E118 Type 2 diabetes mellitus with unspecified complications: Secondary | ICD-10-CM

## 2020-04-17 DIAGNOSIS — E785 Hyperlipidemia, unspecified: Secondary | ICD-10-CM

## 2020-04-17 DIAGNOSIS — Z794 Long term (current) use of insulin: Secondary | ICD-10-CM

## 2020-04-17 DIAGNOSIS — I10 Essential (primary) hypertension: Secondary | ICD-10-CM | POA: Diagnosis not present

## 2020-04-17 NOTE — Telephone Encounter (Signed)
Called patient back and offered next available appointment on 9/3. She stated that she had been waiting for this appointment for a long time. I advised that we had her scheduled for 7/16 and she canceled that appointment She asked for another day and I scheduled her for 9/10.

## 2020-04-17 NOTE — Telephone Encounter (Signed)
Patient called. She would like to schedule with Dr. Ernestina Patches.

## 2020-04-17 NOTE — Patient Instructions (Signed)
Medication Instructions:  STOP ranexa STOP chantix  *If you need a refill on your cardiac medications before your next appointment, please call your pharmacy*  Follow-Up: At [facility], you and your health needs are our priority.  As part of our continuing mission to provide you with exceptional heart care, we have created designated Provider Care Teams.  These Care Teams include your primary Cardiologist (physician) and Advanced Practice Providers (APPs -  Physician Assistants and Nurse Practitioners) who all work together to provide you with the care you need, when you need it.  We recommend signing up for the patient portal called "MyChart".  Sign up information is provided on this After Visit Summary.  MyChart is used to connect with patients for Virtual Visits (Telemedicine).  Patients are able to view lab/test results, encounter notes, upcoming appointments, etc.  Non-urgent messages can be sent to your provider as well.   To learn more about what you can do with MyChart, go to [email or web address].    Your next appointment:   6 month(s)  The format for your next appointment:   In Person  Provider:   You may see Pixie Casino, MD or one of the following Advanced Practice Providers on your designated Care Team:    Almyra Deforest, PA-C  Fabian Sharp, PA-C or   Roby Lofts, [place]    Other Instructions

## 2020-04-17 NOTE — Telephone Encounter (Signed)
Pt called and stated she spoke with someone that is filling out a form for her, and we needed the name and office of the eye care center she went to. Saw them on 08/30/19  Dr. Pric[facility] [place], [place], [place] [address] [phone]  Please advise.

## 2020-04-17 NOTE — Progress Notes (Signed)
OFFICE NOTE  Chief Complaint:  Follow-up  Primary Care Physician: Rutherford Guys, MD  HPI:  Ruhani Umland is a 59 y.o. female with a past medial history significant for remote PCI the RCA in 2006, recent and STEMI and cath revealing multivessel coronary disease.  Status post CABG x4 in January 2019, readmitted in April with chest pain and noted occlusion of the graft to OM and diagonal.  Presented again with chest pain in Mar 04, 2018.  Troponin was elevated greater than 3, but no cardiac catheterization was performed.  Medical management was recommended.  She was seen in follow-up in the office and continued to complain of chest pain.  Her last office visit was in August 2019 at which time she continued to complain of chest pain despite adjustment of her medical regimen.  She was scheduled to see me in follow-up since I initially saw her the day after she was consulted on by the fellows, but I have not seen her since then and she has been seen by multiple partners in my group.  Ultimately underwent repeat cardiac catheterization in November 2019 which showed distally occluded LAD but patent LIMA to LAD and SVG to RCA.  The SVG to D1 and SVG to OM1 are chronically occluded.  There are no significant changes from the prior study.  Medical therapy was recommended.  Subsequently her blood pressure medications were increased including her amlodipine up to 10 mg and metoprolol up to 50 mg twice daily.  Since then she is done well without any recurrent chest pain.  Of note though she has had some recent dizziness.  Her blood pressure today was low at 95/53.  She does not follow it routinely.    04/17/2020  Ms. Logsdon returns today for follow-up.  She is overall doing well from a cardiac standpoint.  She denies any chest pain or worsening shortness of breath.  She has been having some issues with her hand and is scheduled to have a nerve conduction study.  Unfortunately her husband just died back in 03/05/23.   He was diagnosed with advanced liver cancer rather suddenly and was getting treatment at [facility] for short period of time.  Ms. Ellingwood had acute loss of 2 bypass grafts and it was recommended for long-term dual antiplatelet therapy.  She wanted to see if we could pare back some of her medicines.  She is also on high doses of isosorbide and on Ranexa.  Again she remains asymptomatic.  Blood pressure is well controlled.  EKG shows no ischemia.  Lipids recently showed an LDL of 86 which is previously at 70.  We discussed the importance of monitoring her diet.  PMHx:  Past Medical History:  Diagnosis Date  . Anxiety   . Arthritis    "maybe in my left foot" (10/14/2017)  . Bladder prolapse, female, acquired 09/14/2017  . Depression   . High cholesterol   . History of stomach ulcers   . Hypertension   . Myocardial infarction ([place]) 12/30/2004  . Tobacco abuse   . Type II diabetes mellitus ([place])     Past Surgical History:  Procedure Laterality Date  . CARDIAC CATHETERIZATION  10/14/2017  . CORONARY ANGIOPLASTY WITH STENT PLACEMENT  12/30/2004   Patient reported  . CORONARY ARTERY BYPASS GRAFT N/A 10/19/2017   Procedure: CORONARY ARTERY BYPASS GRAFTING (CABG) times four using the right saphaneous vein. Harvested endoscopicly and left internal mammary artery.;  Surgeon: Grace Isaac, MD;  Location: [place];  Service: Open Heart Surgery;  Laterality: N/A;  . DILATION AND CURETTAGE OF UTERUS  1980s  . LEFT HEART CATH AND CORONARY ANGIOGRAPHY N/A 10/14/2017   Procedure: LEFT HEART CATH AND CORONARY ANGIOGRAPHY;  Surgeon: Martinique, Peter M, MD;  Location: [place] [facility];  Service: Cardiovascular;  Laterality: N/A;  . LEFT HEART CATH AND CORS/GRAFTS ANGIOGRAPHY N/A 01/01/2018   Procedure: LEFT HEART CATH AND CORS/GRAFTS ANGIOGRAPHY;  Surgeon: Jettie Booze, MD;  Location: [place] [facility];  Service: Cardiovascular;  Laterality: N/A;  . LEFT HEART CATH AND CORS/GRAFTS ANGIOGRAPHY N/A  08/16/2018   Procedure: LEFT HEART CATH AND CORS/GRAFTS ANGIOGRAPHY;  Surgeon: Nelva Bush, MD;  Location: [facility];  Service: Cardiovascular;  Laterality: N/A;  . PILONIDAL CYST EXCISION  1980s  . TEE WITHOUT CARDIOVERSION N/A 10/19/2017   Procedure: TRANSESOPHAGEAL ECHOCARDIOGRAM (TEE);  Surgeon: Grace Isaac, MD;  Location: [place];  Service: Open Heart Surgery;  Laterality: N/A;    FAMHx:  Family History  Problem Relation Age of Onset  . Heart disease Father   . Hyperlipidemia Father   . Hypertension Father     SOCHx:   reports that she has been smoking cigarettes. She started smoking about 47 years ago. She has a 5.40 pack-year smoking history. She has never used smokeless tobacco. She reports current alcohol use of about 14.0 standard drinks of alcohol per week. She reports that she does not use drugs.  ALLERGIES:  Allergies  Allergen Reactions  . Januvia [Sitagliptin] Nausea And Vomiting and Other (See Comments)    Pancreatitis (this was in Epic, but patient was unaware of this)   . Metformin And Related Diarrhea  . Penicillins Other (See Comments)    Unknown childhood reaction Has patient had a PCN reaction causing immediate rash, facial/tongue/throat swelling, SOB or lightheadedness with hypotension: Unknown Has patient had a PCN reaction causing severe rash involving mucus membranes or skin necrosis: Unknown Has patient had a PCN reaction that required hospitalization: Unknown Has patient had a PCN reaction occurring within the last 10 years: No If all of the above answers are "NO", then may proceed with Cephalosporin use.     ROS: Pertinent items noted in HPI and remainder of comprehensive ROS otherwise negative.  HOME MEDS: Current Outpatient Medications on File Prior to Visit  Medication Sig Dispense Refill  . amLODipine (NORVASC) 5 MG tablet TAKE ONE TABLET BY MOUTH ONE TIME DAILY 90 tablet 3  . aspirin 81 MG EC tablet Take 1 tablet (81 mg  total) by mouth daily. 90 tablet 3  . buPROPion (WELLBUTRIN XL) 300 MG 24 hr tablet TAKE ONE TABLET BY MOUTH ONE TIME DAILY 90 tablet 0  . canagliflozin (INVOKANA) 100 MG TABS tablet Take 1 tablet (100 mg total) by mouth daily before breakfast. 90 tablet 1  . clopidogrel (PLAVIX) 75 MG tablet TAKE ONE TABLET BY MOUTH ONE TIME DAILY 90 tablet 3  . Continuous Blood Gluc Sensor (FREESTYLE LIBRE 14 DAY SENSOR) MISC APPLY ONE SENSOR TO THE BACK OF YOUR UPPER ARM. REPLACE EVERY 14 DAYS. 62 each 5  . diclofenac sodium (VOLTAREN) 1 % GEL Apply 4 g topically 4 (four) times daily. 100 g 3  . Dulaglutide (TRULICITY) 9.16 BW/4.6KZ SOPN Inject 0.5 mLs (0.75 mg total) into the skin once a week. 4 pen 5  . isosorbide mononitrate (IMDUR) 120 MG 24 hr tablet Take 1 tablet (120 mg total) by mouth daily. 90 tablet 3  . lisinopril (ZESTRIL) 5 MG  tablet TAKE ONE TABLET BY MOUTH ONE TIME DAILY 30 tablet 2  . loratadine (CLARITIN) 10 MG tablet Take 1 tablet (10 mg total) by mouth daily. 90 tablet 3  . methocarbamol (ROBAXIN) 500 MG tablet Take 1 tablet (500 mg total) by mouth every 8 (eight) hours as needed for muscle spasms. 90 tablet 1  . metoprolol tartrate (LOPRESSOR) 50 MG tablet TAKE ONE TABLET BY MOUTH TWICE A DAY 30 tablet 1  . Multiple Vitamin (MULTIVITAMIN WITH MINERALS) TABS tablet Take 1 tablet by mouth daily.    . nitroGLYCERIN (NITROSTAT) 0.4 MG SL tablet Place 1 tablet (0.4 mg total) under the tongue every 5 (five) minutes as needed for chest pain. 25 tablet 3  . Omega-3 Fatty Acids (FISH OIL PO) Take by mouth.    . rosuvastatin (CRESTOR) 40 MG tablet Take 1 tablet (40 mg total) by mouth daily. 90 tablet 3  . sertraline (ZOLOFT) 100 MG tablet Take 2 tablets (200 mg total) by mouth daily. 180 tablet 1  . TOUJEO MAX SOLOSTAR 300 UNIT/ML SOPN INJECT 55 UNITS UNDER THE SKIN EVERY MORNING 6 pen 2  . traMADol (ULTRAM) 50 MG tablet TAKE ONE TABLET BY MOUTH EVERY 6 HOURS AS NEEDED FOR UP TO 5 DAYS 20 tablet 2  .  TURMERIC PO Take 1 capsule by mouth daily.     [name] ULTICARE MICRO PEN NEEDLES 32G X 4 MM MISC USE WITH INJECTIONS ONE TIME DAILY 400 each 0   No current facility-administered medications on file prior to visit.    LABS/IMAGING: No results found for this or any previous visit (from the past 48 hour(s)). No results found.  LIPID PANEL:    Component Value Date/Time   CHOL 183 03/01/2020 1201   TRIG 134 03/01/2020 1201   HDL 74 03/01/2020 1201   CHOLHDL 2.5 03/01/2020 1201   CHOLHDL 2.7 08/16/2018 0341   VLDL 32 08/16/2018 0341   LDLCALC 86 03/01/2020 1201     WEIGHTS: Wt Readings from Last 3 Encounters:  04/17/20 (!) 216 lb (98 kg)  04/16/20 (!) 215 lb (97.5 kg)  04/03/20 211 lb (95.7 kg)    VITALS: BP (!) 115/60   Pulse 65   Temp (!) 97 F (36.1 C)   Ht 5' 6"  (1.676 m)   Wt (!) 216 lb (98 kg)   SpO2 93%   BMI 34.86 kg/m   EXAM: General appearance: alert and no distress Neck: no carotid bruit, no JVD and thyroid not enlarged, symmetric, no tenderness/mass/nodules Lungs: clear to auscultation bilaterally Heart: regular rate and rhythm Abdomen: soft, non-tender; bowel sounds normal; no masses,  no organomegaly Extremities: extremities normal, atraumatic, no cyanosis or edema Pulses: 2+ and symmetric Skin: Skin color, texture, turgor normal. No rashes or lesions Neurologic: Grossly normal Psych: Pleasant  EKG: Normal sinus rhythm at 63-personally reviewed  ASSESSMENT: 1. CAD status post CABG x 4 in 09/2017 (LIMA to LAD (patent), SVG to RCA (patent), SVG-D1 (CTO) and SVG-OM1 (CTO) as of cath 07/2018 2. HTN 3. HLD 4. DM2  PLAN: 1.   Ms. Smyre seems to be doing well without any anginal symptoms.  We will discontinue her Ranexa but continue isosorbide.  Although she is now couple years out of bypass and she lost 2/4 grafts rather quickly after bypass, would advise that we continue dual antiplatelet therapy.  She says she bleeds fairly easily but is not  significantly bruised.  Blood pressure is well controlled.  Her lipids are a little more elevated  than had been previously.  She will work on her diet for that.  Plan follow-up with me in 6 months or sooner as necessary.  Pixie Casino, MD, [facility], [place] Director of the Advanced Lipid Disorders &  Cardiovascular Risk Reduction Clinic Diplomate of the American Board of Clinical Lipidology Attending Cardiologist  Direct Dial: [phone]  Fax: [phone]  Website:  [email or web address] 04/17/2020, 3:04 PM

## 2020-04-20 ENCOUNTER — Other Ambulatory Visit: Payer: Self-pay

## 2020-04-20 DIAGNOSIS — Z1211 Encounter for screening for malignant neoplasm of colon: Secondary | ICD-10-CM

## 2020-04-20 NOTE — Telephone Encounter (Signed)
Verified w/ [facility]. Patient had her diabetic eye exam  In Decenber 2020 w normal results. They will fax [place] a copy for records.

## 2020-04-20 NOTE — Telephone Encounter (Signed)
Spoke with records dept at [facility] verified mammo and pap dates 09/06/2019. They will fax records.

## 2020-04-23 ENCOUNTER — Encounter: Payer: Self-pay | Admitting: Family Medicine

## 2020-04-23 ENCOUNTER — Other Ambulatory Visit: Payer: Self-pay | Admitting: *Deleted

## 2020-04-24 ENCOUNTER — Ambulatory Visit (INDEPENDENT_AMBULATORY_CARE_PROVIDER_SITE_OTHER): Payer: 59 | Admitting: Psychology

## 2020-04-24 DIAGNOSIS — F329 Major depressive disorder, single episode, unspecified: Secondary | ICD-10-CM | POA: Diagnosis not present

## 2020-04-24 DIAGNOSIS — F419 Anxiety disorder, unspecified: Secondary | ICD-10-CM | POA: Diagnosis not present

## 2020-04-24 MED ORDER — ISOSORBIDE MONONITRATE ER 120 MG PO TB24: 120.0000 mg | ORAL_TABLET | Freq: Every day | ORAL | 3 refills | Status: DC

## 2020-05-04 ENCOUNTER — Other Ambulatory Visit: Payer: Self-pay | Admitting: Internal Medicine

## 2020-05-04 ENCOUNTER — Other Ambulatory Visit: Payer: Self-pay | Admitting: Family Medicine

## 2020-05-04 ENCOUNTER — Other Ambulatory Visit: Payer: Self-pay | Admitting: Registered Nurse

## 2020-05-04 DIAGNOSIS — F32A Depression, unspecified: Secondary | ICD-10-CM

## 2020-05-04 DIAGNOSIS — E118 Type 2 diabetes mellitus with unspecified complications: Secondary | ICD-10-CM

## 2020-05-04 DIAGNOSIS — F419 Anxiety disorder, unspecified: Secondary | ICD-10-CM

## 2020-05-04 DIAGNOSIS — Z794 Long term (current) use of insulin: Secondary | ICD-10-CM

## 2020-05-04 DIAGNOSIS — F329 Major depressive disorder, single episode, unspecified: Secondary | ICD-10-CM

## 2020-05-04 DIAGNOSIS — M79601 Pain in right arm: Secondary | ICD-10-CM

## 2020-05-04 DIAGNOSIS — I10 Essential (primary) hypertension: Secondary | ICD-10-CM

## 2020-05-04 NOTE — Telephone Encounter (Signed)
Requested  medications are  due for refill today yes  Requested medications are on the active medication list yes  Last refill 7/16  Last visit 03/2020  Notes to clinic Last visit states may discontinue this med.

## 2020-05-09 ENCOUNTER — Ambulatory Visit (INDEPENDENT_AMBULATORY_CARE_PROVIDER_SITE_OTHER): Payer: 59 | Admitting: Psychology

## 2020-05-09 DIAGNOSIS — F419 Anxiety disorder, unspecified: Secondary | ICD-10-CM | POA: Diagnosis not present

## 2020-05-09 DIAGNOSIS — F329 Major depressive disorder, single episode, unspecified: Secondary | ICD-10-CM

## 2020-05-17 ENCOUNTER — Encounter: Payer: Self-pay | Admitting: Family Medicine

## 2020-05-17 NOTE — Telephone Encounter (Signed)
Pt sending you an FYI about glucose levels

## 2020-05-24 ENCOUNTER — Ambulatory Visit (INDEPENDENT_AMBULATORY_CARE_PROVIDER_SITE_OTHER): Payer: 59 | Admitting: Psychology

## 2020-05-24 DIAGNOSIS — F329 Major depressive disorder, single episode, unspecified: Secondary | ICD-10-CM

## 2020-05-24 DIAGNOSIS — F419 Anxiety disorder, unspecified: Secondary | ICD-10-CM

## 2020-06-01 ENCOUNTER — Ambulatory Visit (INDEPENDENT_AMBULATORY_CARE_PROVIDER_SITE_OTHER): Payer: 59 | Admitting: Physical Medicine and Rehabilitation

## 2020-06-01 ENCOUNTER — Encounter: Payer: Self-pay | Admitting: Physical Medicine and Rehabilitation

## 2020-06-01 ENCOUNTER — Other Ambulatory Visit: Payer: Self-pay

## 2020-06-01 DIAGNOSIS — R202 Paresthesia of skin: Secondary | ICD-10-CM | POA: Diagnosis not present

## 2020-06-01 NOTE — Progress Notes (Signed)
Numbness in first three fingers of both hands. Worse in the morning. Pain with driving. Right is worse.  Right hand dominant. Numeric Pain Rating Scale and Functional Assessment Average Pain 10   In the last MONTH (on 0-10 scale) has pain interfered with the following?  1. General activity like being  able to carry out your everyday physical activities such as walking, climbing stairs, carrying groceries, or moving a chair?  Rating(2)

## 2020-06-03 ENCOUNTER — Other Ambulatory Visit: Payer: Self-pay | Admitting: Family Medicine

## 2020-06-03 ENCOUNTER — Other Ambulatory Visit: Payer: Self-pay | Admitting: Registered Nurse

## 2020-06-03 DIAGNOSIS — Z794 Long term (current) use of insulin: Secondary | ICD-10-CM

## 2020-06-03 DIAGNOSIS — E118 Type 2 diabetes mellitus with unspecified complications: Secondary | ICD-10-CM

## 2020-06-03 DIAGNOSIS — M79601 Pain in right arm: Secondary | ICD-10-CM

## 2020-06-04 ENCOUNTER — Ambulatory Visit (INDEPENDENT_AMBULATORY_CARE_PROVIDER_SITE_OTHER): Payer: 59 | Admitting: Psychology

## 2020-06-04 DIAGNOSIS — F329 Major depressive disorder, single episode, unspecified: Secondary | ICD-10-CM

## 2020-06-04 DIAGNOSIS — F419 Anxiety disorder, unspecified: Secondary | ICD-10-CM

## 2020-06-05 NOTE — Procedures (Signed)
EMG & NCV Findings: Evaluation of the left median motor and the right median motor nerves showed prolonged distal onset latency (L6.7, R5.9 ms) and decreased conduction velocity (Elbow-Wrist, L32, R38 m/s).  The left median (across palm) sensory nerve showed no response (Palm), prolonged distal peak latency (10.0 ms), and reduced amplitude (8.5 V).  The right median (across palm) sensory nerve showed no response (Wrist) and prolonged distal peak latency (Palm, 4.2 ms).  All remaining nerves (as indicated in the following tables) were within normal limits.  Left vs. Right side comparison data for the median motor nerve indicates abnormal L-R latency difference (0.8 ms).  All remaining left vs. right side differences were within normal limits.    All examined muscles (as indicated in the following table) showed no evidence of electrical instability.    Impression: The above electrodiagnostic study is ABNORMAL and reveals evidence of a severe bilateral median nerve entrapment at the wrist (carpal tunnel syndrome) affecting sensory and motor components.   There is no significant electrodiagnostic evidence of any other focal nerve entrapment, brachial plexopathy or generalized peripheral neuropathy  Recommendations: 1.  Follow-up with referring physician. 2.  Continue current management of symptoms. 3.  Suggest surgical evaluation.  ___________________________ Victoria Eaton FAAPMR Board Certified, American Board of Physical Medicine and Rehabilitation    Nerve Conduction Studies Anti Sensory Summary Table   Stim Site NR Peak (ms) Norm Peak (ms) P-T Amp (V) Norm P-T Amp Site1 Site2 Delta-P (ms) Dist (cm) Vel (m/s) Norm Vel (m/s)  Left Median Acr Palm Anti Sensory (2nd Digit)  31.7C  Wrist    *10.0 <3.6 *8.5 >10 Wrist Palm  0.0    Palm *NR  <2.0          Right Median Acr Palm Anti Sensory (2nd Digit)  31.4C  Wrist *NR  <3.6  >10 Wrist Palm  0.0    Palm    *4.2 <2.0 6.2         Left Radial  Anti Sensory (Base 1st Digit)  31.6C  Wrist    2.2 <3.1 34.3  Wrist Base 1st Digit 2.2 0.0    Right Radial Anti Sensory (Base 1st Digit)  31.1C  Wrist    2.1 <3.1 32.0  Wrist Base 1st Digit 2.1 0.0    Left Ulnar Anti Sensory (5th Digit)  31.8C  Wrist    3.4 <3.7 17.6 >15.0 Wrist 5th Digit 3.4 14.0 41 >38  Right Ulnar Anti Sensory (5th Digit)  31.3C  Wrist    3.3 <3.7 21.5 >15.0 Wrist 5th Digit 3.3 14.0 42 >38   Motor Summary Table   Stim Site NR Onset (ms) Norm Onset (ms) O-P Amp (mV) Norm O-P Amp Site1 Site2 Delta-0 (ms) Dist (cm) Vel (m/s) Norm Vel (m/s)  Left Median Motor (Abd Poll Brev)  31.6C  Wrist    *6.7 <4.2 5.5 >5 Elbow Wrist 6.3 20.0 *32 >50  Elbow    13.0  0.5         Right Median Motor (Abd Poll Brev)  31C  Wrist    *5.9 <4.2 6.3 >5 Elbow Wrist 5.0 19.0 *38 >50  Elbow    10.9  4.1         Left Ulnar Motor (Abd Dig Min)  31.8C  Wrist    3.0 <4.2 9.4 >3 B Elbow Wrist 3.3 18.0 55 >53  B Elbow    6.3  8.2  A Elbow B Elbow 1.7 10.0 59 >53  A Elbow  8.0  8.7         Right Ulnar Motor (Abd Dig Min)  30.9C  Wrist    3.0 <4.2 8.1 >3 B Elbow Wrist 3.7 21.0 57 >53  B Elbow    6.7  7.4  A Elbow B Elbow 1.5 10.0 67 >53  A Elbow    8.2  7.6          EMG   Side Muscle Nerve Root Ins Act Fibs Psw Amp Dur Poly Recrt Int Fraser Din Comment  Right Abd Poll Brev Median C8-T1 Nml Nml Nml Nml Nml 0 Nml Nml   Right 1stDorInt Ulnar C8-T1 Nml Nml Nml Nml Nml 0 Nml Nml   Right PronatorTeres Median C6-7 Nml Nml Nml Nml Nml 0 Nml Nml   Right Biceps Musculocut C5-6 Nml Nml Nml Nml Nml 0 Nml Nml   Right Deltoid Axillary C5-6 Nml Nml Nml Nml Nml 0 Nml Nml     Nerve Conduction Studies Anti Sensory Left/Right Comparison   Stim Site L Lat (ms) R Lat (ms) L-R Lat (ms) L Amp (V) R Amp (V) L-R Amp (%) Site1 Site2 L Vel (m/s) R Vel (m/s) L-R Vel (m/s)  Median Acr Palm Anti Sensory (2nd Digit)  31.7C  Wrist *10.0   *8.5   Wrist Palm     Palm  *4.2   6.2        Radial Anti Sensory (Base 1st  Digit)  31.6C  Wrist 2.2 2.1 0.1 34.3 32.0 6.7 Wrist Base 1st Digit     Ulnar Anti Sensory (5th Digit)  31.8C  Wrist 3.4 3.3 0.1 17.6 21.5 18.1 Wrist 5th Digit 41 42 1   Motor Left/Right Comparison   Stim Site L Lat (ms) R Lat (ms) L-R Lat (ms) L Amp (mV) R Amp (mV) L-R Amp (%) Site1 Site2 L Vel (m/s) R Vel (m/s) L-R Vel (m/s)  Median Motor (Abd Poll Brev)  31.6C  Wrist *6.7 *5.9 *0.8 5.5 6.3 12.7 Elbow Wrist *32 *38 6  Elbow 13.0 10.9 2.1 0.5 4.1 87.8       Ulnar Motor (Abd Dig Min)  31.8C  Wrist 3.0 3.0 0.0 9.4 8.1 13.8 B Elbow Wrist 55 57 2  B Elbow 6.3 6.7 0.4 8.2 7.4 9.8 A Elbow B Elbow 59 67 8  A Elbow 8.0 8.2 0.2 8.7 7.6 12.6          Waveforms:

## 2020-06-05 NOTE — Progress Notes (Signed)
Victoria Eaton - 59 y.o. female MRN [id number]  Date of birth: 03/20/1961  Office Visit Note: Visit Date: 06/01/2020 PCP: Rutherford Guys, MD Referred by: Rutherford Guys, MD  Subjective: Chief Complaint  Patient presents with  . Right Hand - Pain, Numbness  . Left Hand - Pain, Numbness   HPI:  Victoria Eaton is a 59 y.o. female who comes in today at the request of Dr. Eduard Roux for electrodiagnostic study of the Bilateral upper extremities.  Patient is Right hand dominant.  She initially saw Dr. Erlinda Hong with complaints of left more than right hand pain with numbness and tingling.  Today she reports right worse than left.  She reports bilateral numbness in the first 3 digits of both hands.  She rates his pain as a 10 out of 10.  She does have a history of diabetes.  She has had no prior electrodiagnostic studies.  She does report some right shoulder pain and history of trigger finger as well.  She denies any frank radicular symptoms.   ROS Otherwise per HPI.  Assessment & Plan: Visit Diagnoses:  1. Paresthesia of skin     Plan: Impression: The above electrodiagnostic study is ABNORMAL and reveals evidence of a severe bilateral median nerve entrapment at the wrist (carpal tunnel syndrome) affecting sensory and motor components.   There is no significant electrodiagnostic evidence of any other focal nerve entrapment, brachial plexopathy or generalized peripheral neuropathy  Recommendations: 1.  Follow-up with referring physician. 2.  Continue current management of symptoms. 3.  Suggest surgical evaluation.  Meds & Orders: No orders of the defined types were placed in this encounter.   Orders Placed This Encounter  Procedures  . NCV with EMG (electromyography)    Follow-up: Return for  Eduard Roux, M.D..   Procedures: No procedures performed  EMG & NCV Findings: Evaluation of the left median motor and the right median motor nerves showed prolonged distal onset latency (L6.7,  R5.9 ms) and decreased conduction velocity (Elbow-Wrist, L32, R38 m/s).  The left median (across palm) sensory nerve showed no response (Palm), prolonged distal peak latency (10.0 ms), and reduced amplitude (8.5 V).  The right median (across palm) sensory nerve showed no response (Wrist) and prolonged distal peak latency (Palm, 4.2 ms).  All remaining nerves (as indicated in the following tables) were within normal limits.  Left vs. Right side comparison data for the median motor nerve indicates abnormal L-R latency difference (0.8 ms).  All remaining left vs. right side differences were within normal limits.    All examined muscles (as indicated in the following table) showed no evidence of electrical instability.    Impression: The above electrodiagnostic study is ABNORMAL and reveals evidence of a severe bilateral median nerve entrapment at the wrist (carpal tunnel syndrome) affecting sensory and motor components.   There is no significant electrodiagnostic evidence of any other focal nerve entrapment, brachial plexopathy or generalized peripheral neuropathy  Recommendations: 1.  Follow-up with referring physician. 2.  Continue current management of symptoms. 3.  Suggest surgical evaluation.  ___________________________ Laurence Spates FAAPMR Board Certified, American Board of Physical Medicine and Rehabilitation    Nerve Conduction Studies Anti Sensory Summary Table   Stim Site NR Peak (ms) Norm Peak (ms) P-T Amp (V) Norm P-T Amp Site1 Site2 Delta-P (ms) Dist (cm) Vel (m/s) Norm Vel (m/s)  Left Median Acr Palm Anti Sensory (2nd Digit)  31.7C  Wrist    *10.0 <3.6 *8.5 >10 Wrist Palm  0.0  Palm *NR  <2.0          Right Median Acr Palm Anti Sensory (2nd Digit)  31.4C  Wrist *NR  <3.6  >10 Wrist Palm  0.0    Palm    *4.2 <2.0 6.2         Left Radial Anti Sensory (Base 1st Digit)  31.6C  Wrist    2.2 <3.1 34.3  Wrist Base 1st Digit 2.2 0.0    Right Radial Anti Sensory (Base 1st  Digit)  31.1C  Wrist    2.1 <3.1 32.0  Wrist Base 1st Digit 2.1 0.0    Left Ulnar Anti Sensory (5th Digit)  31.8C  Wrist    3.4 <3.7 17.6 >15.0 Wrist 5th Digit 3.4 14.0 41 >38  Right Ulnar Anti Sensory (5th Digit)  31.3C  Wrist    3.3 <3.7 21.5 >15.0 Wrist 5th Digit 3.3 14.0 42 >38   Motor Summary Table   Stim Site NR Onset (ms) Norm Onset (ms) O-P Amp (mV) Norm O-P Amp Site1 Site2 Delta-0 (ms) Dist (cm) Vel (m/s) Norm Vel (m/s)  Left Median Motor (Abd Poll Brev)  31.6C  Wrist    *6.7 <4.2 5.5 >5 Elbow Wrist 6.3 20.0 *32 >50  Elbow    13.0  0.5         Right Median Motor (Abd Poll Brev)  31C  Wrist    *5.9 <4.2 6.3 >5 Elbow Wrist 5.0 19.0 *38 >50  Elbow    10.9  4.1         Left Ulnar Motor (Abd Dig Min)  31.8C  Wrist    3.0 <4.2 9.4 >3 B Elbow Wrist 3.3 18.0 55 >53  B Elbow    6.3  8.2  A Elbow B Elbow 1.7 10.0 59 >53  A Elbow    8.0  8.7         Right Ulnar Motor (Abd Dig Min)  30.9C  Wrist    3.0 <4.2 8.1 >3 B Elbow Wrist 3.7 21.0 57 >53  B Elbow    6.7  7.4  A Elbow B Elbow 1.5 10.0 67 >53  A Elbow    8.2  7.6          EMG   Side Muscle Nerve Root Ins Act Fibs Psw Amp Dur Poly Recrt Int Fraser Din Comment  Right Abd Poll Brev Median C8-T1 Nml Nml Nml Nml Nml 0 Nml Nml   Right 1stDorInt Ulnar C8-T1 Nml Nml Nml Nml Nml 0 Nml Nml   Right PronatorTeres Median C6-7 Nml Nml Nml Nml Nml 0 Nml Nml   Right Biceps Musculocut C5-6 Nml Nml Nml Nml Nml 0 Nml Nml   Right Deltoid Axillary C5-6 Nml Nml Nml Nml Nml 0 Nml Nml     Nerve Conduction Studies Anti Sensory Left/Right Comparison   Stim Site L Lat (ms) R Lat (ms) L-R Lat (ms) L Amp (V) R Amp (V) L-R Amp (%) Site1 Site2 L Vel (m/s) R Vel (m/s) L-R Vel (m/s)  Median Acr Palm Anti Sensory (2nd Digit)  31.7C  Wrist *10.0   *8.5   Wrist Palm     Palm  *4.2   6.2        Radial Anti Sensory (Base 1st Digit)  31.6C  Wrist 2.2 2.1 0.1 34.3 32.0 6.7 Wrist Base 1st Digit     Ulnar Anti Sensory (5th Digit)  31.8C  Wrist 3.4 3.3 0.1  17.6 21.5 18.1 Wrist 5th Digit 41  42 1   Motor Left/Right Comparison   Stim Site L Lat (ms) R Lat (ms) L-R Lat (ms) L Amp (mV) R Amp (mV) L-R Amp (%) Site1 Site2 L Vel (m/s) R Vel (m/s) L-R Vel (m/s)  Median Motor (Abd Poll Brev)  31.6C  Wrist *6.7 *5.9 *0.8 5.5 6.3 12.7 Elbow Wrist *32 *38 6  Elbow 13.0 10.9 2.1 0.5 4.1 87.8       Ulnar Motor (Abd Dig Min)  31.8C  Wrist 3.0 3.0 0.0 9.4 8.1 13.8 B Elbow Wrist 55 57 2  B Elbow 6.3 6.7 0.4 8.2 7.4 9.8 A Elbow B Elbow 59 67 8  A Elbow 8.0 8.2 0.2 8.7 7.6 12.6          Waveforms:                      Clinical History: No specialty comments available.     Objective:  VS:  HT:    WT:   BMI:     BP:   HR: bpm  TEMP: ( )  RESP:  Physical Exam Musculoskeletal:        General: No swelling, tenderness or deformity.     Comments: Inspection reveals no atrophy of the bilateral APB or FDI or hand intrinsics. There is no swelling, color changes, allodynia or dystrophic changes. There is 5 out of 5 strength in the bilateral wrist extension, finger abduction and long finger flexion.  There is decreased sensation to light touch in the bilateral median nerve distribution..  There is a negative Hoffmann's test bilaterally.  Skin:    General: Skin is warm and dry.     Findings: No erythema or rash.  Neurological:     General: No focal deficit present.     Mental Status: She is alert and oriented to person, place, and time.     Motor: No weakness or abnormal muscle tone.     Coordination: Coordination normal.  Psychiatric:        Mood and Affect: Mood normal.        Behavior: Behavior normal.      Imaging: No results found.

## 2020-06-12 ENCOUNTER — Ambulatory Visit: Payer: 59 | Admitting: Psychology

## 2020-06-12 ENCOUNTER — Ambulatory Visit: Payer: 59 | Admitting: Orthopaedic Surgery

## 2020-06-15 ENCOUNTER — Other Ambulatory Visit: Payer: Self-pay | Admitting: Family Medicine

## 2020-06-19 ENCOUNTER — Ambulatory Visit: Payer: 59 | Admitting: Psychology

## 2020-06-28 ENCOUNTER — Ambulatory Visit: Payer: 59 | Admitting: Orthopaedic Surgery

## 2020-07-02 ENCOUNTER — Telehealth: Payer: Self-pay | Admitting: Orthopaedic Surgery

## 2020-07-02 NOTE — Telephone Encounter (Signed)
Called patient left message on voicemail to return call concerning no show for appointment in [place] message

## 2020-07-03 ENCOUNTER — Other Ambulatory Visit: Payer: Self-pay | Admitting: Registered Nurse

## 2020-07-03 ENCOUNTER — Telehealth: Payer: Self-pay | Admitting: Family Medicine

## 2020-07-03 DIAGNOSIS — M79601 Pain in right arm: Secondary | ICD-10-CM

## 2020-07-03 NOTE — Telephone Encounter (Signed)
[facility] was calling for a refill on gabapentin. Pharm is aware rx was sent in aug #90 with 2 refills

## 2020-07-09 ENCOUNTER — Ambulatory Visit (INDEPENDENT_AMBULATORY_CARE_PROVIDER_SITE_OTHER): Payer: 59 | Admitting: Psychology

## 2020-07-09 DIAGNOSIS — F329 Major depressive disorder, single episode, unspecified: Secondary | ICD-10-CM

## 2020-07-09 DIAGNOSIS — F419 Anxiety disorder, unspecified: Secondary | ICD-10-CM

## 2020-07-10 ENCOUNTER — Ambulatory Visit: Payer: 59 | Admitting: Orthopaedic Surgery

## 2020-07-19 ENCOUNTER — Ambulatory Visit: Payer: 59 | Admitting: Orthopaedic Surgery

## 2020-07-24 ENCOUNTER — Ambulatory Visit (INDEPENDENT_AMBULATORY_CARE_PROVIDER_SITE_OTHER): Payer: 59 | Admitting: Orthopaedic Surgery

## 2020-07-24 ENCOUNTER — Encounter: Payer: Self-pay | Admitting: Orthopaedic Surgery

## 2020-07-24 ENCOUNTER — Ambulatory Visit (INDEPENDENT_AMBULATORY_CARE_PROVIDER_SITE_OTHER): Payer: 59

## 2020-07-24 ENCOUNTER — Other Ambulatory Visit: Payer: Self-pay

## 2020-07-24 DIAGNOSIS — M542 Cervicalgia: Secondary | ICD-10-CM | POA: Diagnosis not present

## 2020-07-24 DIAGNOSIS — G5603 Carpal tunnel syndrome, bilateral upper limbs: Secondary | ICD-10-CM

## 2020-07-24 DIAGNOSIS — M65331 Trigger finger, right middle finger: Secondary | ICD-10-CM

## 2020-07-24 MED ORDER — METHYLPREDNISOLONE 4 MG PO TBPK: ORAL_TABLET | ORAL | 0 refills | Status: DC

## 2020-07-24 MED ORDER — METHOCARBAMOL 500 MG PO TABS: 500.0000 mg | ORAL_TABLET | Freq: Two times a day (BID) | ORAL | 0 refills | Status: DC | PRN

## 2020-07-24 NOTE — Progress Notes (Signed)
Office Visit Note   [name]           Date of Birth: 1961/04/27           MRN: [id number] Visit Date: 07/24/2020              Requested by: Rutherford Guys, MD No address on file PCP: Rutherford Guys, MD (Inactive)   Assessment & Plan: Visit Diagnoses:  1. Cervicalgia   2. Bilateral carpal tunnel syndrome   3. Trigger finger, right middle finger     Plan: Impression is bilateral carpal tunnel syndrome, right long trigger finger and cervical spine pain with radiculopathy.  In regards to the carpal tunnel syndrome, the right is more painful than the left.  We have discussed cortisone injections versus surgical intervention.  She would like to proceed with right carpal tunnel release and right long finger A1 pulley release.  Risk, benefits and poss complications reviewed.  Rehab recovery time discussed.  All questions were answered.  We will obtain an A1c today and if this is under 8.0 we will call her to schedule surgery.  Should we proceed with surgery, she will need cardiac clearance that she has a history of CABG and is on aspirin and Plavix.  In regards to the neck pain and radiculopathy, have called in a steroid taper and muscle relaxer as well as sent in a referral to physical therapy.  Follow-Up Instructions: Return for post-op.   Orders:  Orders Placed This Encounter  Procedures  . XR Cervical Spine 2 or 3 views  . HgB A1c  . Ambulatory referral to Physical Therapy   Meds ordered this encounter  Medications  . methylPREDNISolone (MEDROL DOSEPAK) 4 MG TBPK tablet    Sig: Take as directed    Dispense:  21 tablet    Refill:  0  . methocarbamol (ROBAXIN) 500 MG tablet    Sig: Take 1 tablet (500 mg total) by mouth 2 (two) times daily as needed.    Dispense:  20 tablet    Refill:  0      Procedures: No procedures performed   Clinical Data: No additional findings.   Subjective: Chief Complaint  Patient presents with  . Right Hand - Pain  .  Left Hand - Pain    HPI patient is a pleasant 59 year old female who comes in today with bilateral hand paresthesias, right long trigger finger and neck and parascapular pain both sides.  In regards to the paresthesias in her hands, she has had the symptoms for a long time.  She has recently undergone nerve conduction study which showed severe bilateral carpal tunnel syndrome.  Neither carpal tunnels have been injected with cortisone.  She feels as though her hands are starting to get weak.  In regards to the trigger finger, she has had pain and triggering here for many months.  She has had this injected in the past without long-lasting relief of symptoms.  In regards to her neck and parascapular pain, this began approximately 6 weeks ago after trimming shrubbery and cleaning around the house to prepare for a Memorial.  She has had increased pain with moving her neck as well as pain in the parascapular region when moving her shoulders.  Review of Systems as detailed in HPI.  All others reviewed and are negative.   Objective: Vital Signs: There were no vitals taken for this visit.  Physical Exam well-developed well-nourished female no acute distress.  Alert oriented x3.  Ortho Exam bilateral wrist exams show positive Phalen on the left.  Negative on the right.  Negative Tinel both sides.  Mild thenar atrophy on the right.  She does have a tender and palpable nodule at the A1 pulley of the right long finger.  She has reproducible triggering as well.  She is neurovascular intact distally.  Cervical spine shows increased pain with range of motion in all planes.  She has moderate tenderness to palpation along the parascapular region both sides.  No focal weakness.  Specialty Comments:  No specialty comments available.  Imaging: XR Cervical Spine 2 or 3 views  Result Date: 07/24/2020 X-rays demonstrate straightening of the cervical spine with anterior osteophyte formation and multilevel  spondylosis    PMFS History: Patient Active Problem List   Diagnosis Date Noted  . Non-ST elevation (NSTEMI) myocardial infarction ([place]) 02/17/2018  . Type 2 diabetes mellitus with complication, with long-term current use of insulin ([place]) 02/17/2018  . Chest pain 01/01/2018  . Unstable angina ([place]) 01/01/2018  . Arm contusion, left, initial encounter 11/25/2017  . Contusion of right knee 11/25/2017  . Hx of CABG 10/19/2017  . Epigastric pain   . Pancreatitis 10/10/2017  . Hyperlipidemia LDL goal <70 09/14/2017  . Essential hypertension 05/01/2017  . Anxiety and depression 05/01/2017  . Tobacco abuse 05/01/2017  . History of MI (myocardial infarction) 05/01/2017  . Allergic rhinitis 05/01/2017  . History of arthritis 05/01/2017  . Bladder prolapse, female, acquired 11/09/2013   Past Medical History:  Diagnosis Date  . Anxiety   . Arthritis    "maybe in my left foot" (10/14/2017)  . Bladder prolapse, female, acquired 09/14/2017  . Depression   . High cholesterol   . History of stomach ulcers   . Hypertension   . Myocardial infarction ([place]) 12/30/2004  . Tobacco abuse   . Type II diabetes mellitus ([facility])     Family History  Problem Relation Age of Onset  . Heart disease Father   . Hyperlipidemia Father   . Hypertension Father     Past Surgical History:  Procedure Laterality Date  . CARDIAC CATHETERIZATION  10/14/2017  . CORONARY ANGIOPLASTY WITH STENT PLACEMENT  12/30/2004   Patient reported  . CORONARY ARTERY BYPASS GRAFT N/A 10/19/2017   Procedure: CORONARY ARTERY BYPASS GRAFTING (CABG) times four using the right saphaneous vein. Harvested endoscopicly and left internal mammary artery.;  Surgeon: Grace Isaac, MD;  Location: [facility];  Service: Open Heart Surgery;  Laterality: N/A;  . DILATION AND CURETTAGE OF UTERUS  1980s  . LEFT HEART CATH AND CORONARY ANGIOGRAPHY N/A 10/14/2017   Procedure: LEFT HEART CATH AND CORONARY ANGIOGRAPHY;  Surgeon: Martinique, Peter M,  MD;  Location: [facility];  Service: Cardiovascular;  Laterality: N/A;  . LEFT HEART CATH AND CORS/GRAFTS ANGIOGRAPHY N/A 01/01/2018   Procedure: LEFT HEART CATH AND CORS/GRAFTS ANGIOGRAPHY;  Surgeon: Jettie Booze, MD;  Location: [facility];  Service: Cardiovascular;  Laterality: N/A;  . LEFT HEART CATH AND CORS/GRAFTS ANGIOGRAPHY N/A 08/16/2018   Procedure: LEFT HEART CATH AND CORS/GRAFTS ANGIOGRAPHY;  Surgeon: Nelva Bush, MD;  Location: [facility];  Service: Cardiovascular;  Laterality: N/A;  . PILONIDAL CYST EXCISION  1980s  . TEE WITHOUT CARDIOVERSION N/A 10/19/2017   Procedure: TRANSESOPHAGEAL ECHOCARDIOGRAM (TEE);  Surgeon: Grace Isaac, MD;  Location: [facility];  Service: Open Heart Surgery;  Laterality: N/A;   Social History   Occupational History  . Not on file  Tobacco Use  . Smoking status: Current Some Day Smoker    Packs/day: 0.12    Years: 45.00    Pack years: 5.40    Types: Cigarettes    Start date: 05/01/1972    Last attempt to quit: 09/30/2017    Years since quitting: 2.8  . Smokeless tobacco: Never Used  Vaping Use  . Vaping Use: Never used  Substance and Sexual Activity  . Alcohol use: Yes    Alcohol/week: 14.0 standard drinks    Types: 14 Glasses of wine per week    Comment: 2 glasses red wine at night  . Drug use: No  . Sexual activity: Not Currently

## 2020-07-25 LAB — HEMOGLOBIN A1C
Hgb A1c MFr Bld: 7.7 % of total Hgb — ABNORMAL HIGH (ref <5.7)
Mean Plasma Glucose: 174 (calc)
eAG (mmol/L): 9.7 (calc)

## 2020-07-25 LAB — EXTRA SPECIMEN

## 2020-07-27 ENCOUNTER — Other Ambulatory Visit: Payer: Self-pay | Admitting: Emergency Medicine

## 2020-07-27 MED ORDER — TOUJEO MAX SOLOSTAR 300 UNIT/ML ~~LOC~~ SOPN
PEN_INJECTOR | SUBCUTANEOUS | 2 refills | Status: DC
Start: 2020-07-27 — End: 2021-04-29

## 2020-07-27 NOTE — Telephone Encounter (Signed)
Requested medication (s) are due for refill today: yes  Requested medication (s) are on the active medication list: yes  Last refill:  11/10/2019  Future visit scheduled: no  Notes to clinic:  Lat filled by Dr. Pamella Pert   Requested Prescriptions  Pending Prescriptions Disp Refills   insulin glargine, 2 Unit Dial, (TOUJEO MAX SOLOSTAR) 300 UNIT/ML Solostar Pen      Sig: INJECT 36 [place]      Endocrinology:  Diabetes - Insulins Passed - 07/27/2020  1:13 PM      Passed - HBA1C is between 0 and 7.9 and within 180 days    Hgb A1c MFr Bld  Date Value Ref Range Status  07/24/2020 7.7 (H) <5.7 % of total Hgb Final    Comment:    For someone without known diabetes, a hemoglobin A1c value of 6.5% or greater indicates that they may have  diabetes and this should be confirmed with a follow-up  test. . For someone with known diabetes, a value <7% indicates  that their diabetes is well controlled and a value  greater than or equal to 7% indicates suboptimal  control. A1c targets should be individualized based on  duration of diabetes, age, comorbid conditions, and  other considerations. . Currently, no consensus exists regarding use of hemoglobin A1c for diagnosis of diabetes for children. Renella Cunas - Valid encounter within last 6 months    Recent Outpatient Visits           3 months ago Type 2 diabetes mellitus with complication, with long-term current use of insulin [facility])   [facility], Lilia Argue, MD   4 months ago Right arm pain   [facility], [place], NP   6 months ago Type 2 diabetes mellitus with complication, with long-term current use of insulin [facility])   [facility], Lilia Argue, MD   8 months ago Type 2 diabetes mellitus with complication, with long-term current use of insulin [facility])   [facility], Lilia Argue, MD   11 months ago Type 2 diabetes mellitus with  complication, with long-term current use of insulin [facility])   [facility], Lilia Argue, MD

## 2020-07-27 NOTE — Telephone Encounter (Signed)
Medication Refill - Medication: TOUJEO MAX SOLOSTAR 300 UNIT/ML SOPN   Has the patient contacted their pharmacy? Yes.   (Agent: If no, request that the patient contact the pharmacy for the refill.) (Agent: If yes, when and what did the pharmacy advise?)  Preferred Pharmacy (with phone number or street name):  [facility] [address] [place], [place]  [place]. [place] [address]  Phone: [phone] Fax: [phone]     Agent: Please be advised that RX refills may take up to 3 business days. We ask that you follow-up with your pharmacy.

## 2020-07-30 ENCOUNTER — Telehealth: Payer: Self-pay | Admitting: *Deleted

## 2020-07-30 NOTE — Telephone Encounter (Signed)
° °  [facility] Pre-operative Risk Assessment    HEARTCARE STAFF: - Please ensure there is not already an duplicate clearance open for this procedure. - Under Visit Info/Reason for Call, type in Other and utilize the format Clearance MM/DD/YY or Clearance TBD. Do not use dashes or single digits. - If request is for dental extraction, please clarify the # of teeth to be extracted.  Request for surgical clearance:  1. What type of surgery is being performed?  CARPAL TUNNEL RELEASE / TRIGGER FINGER RELEASE   2. When is this surgery scheduled?  TBD   3. What type of clearance is required (medical clearance vs. Pharmacy clearance to hold med vs. Both)?  BOTH  4. Are there any medications that need to be held prior to surgery and how long? PLAVIX WAS ONLY LISTED ON THE CLEARANCE BUT PT IS ON ASPIRIN AS WELL    5. Practice name and name of physician performing surgery?  [facility]    6. What is the office phone number?  [phone]   7.   What is the office fax number?  [phone] ATTN:  SHERRIE  8.   Anesthesia type (None, local, MAC, general) ?  MAC & BIER BLOCK   Jeanann Lewandowsky 07/30/2020, 1:52 PM  _________________________________________________________________   (provider comments below)

## 2020-07-31 NOTE — Telephone Encounter (Signed)
Ok to hold Plavix 5 days prior to upcoming surgery - restart when feasible after from a bleeding standpoint.  Dr Lemmie Evens

## 2020-07-31 NOTE — Telephone Encounter (Signed)
   Primary Cardiologist: Pixie Casino, MD  Chart reviewed as part of pre-operative protocol coverage.   Patient has a history of CAD s/p CABG 09/2018 with subsequent loss of 2/4 grafts with occluded SVG to D1 and SVG to OM1 on [facility] 07/2018. She was last evaluated by cardiology at an outpatient visit with Dr. Debara Pickett 03/2020 and was without anginal complaints at that time.  Dr. Debara Pickett - can you comment on recommendations for holding plavix prior to upcoming carpal tunnel surgery? Please route your response back to P CV DIV PREOP.   Thank you!   [name], PA-C 07/31/2020, 1:18 PM

## 2020-08-01 ENCOUNTER — Telehealth: Payer: Self-pay

## 2020-08-01 NOTE — Telephone Encounter (Signed)
   Primary Cardiologist: Pixie Casino, MD  Chart reviewed as part of pre-operative protocol coverage. Patient was contacted 08/01/2020 in reference to pre-operative risk assessment for pending surgery as outlined below.  Victoria Eaton was last seen on 04/17/20 by Dr. Debara Pickett.  Since that day, Victoria Eaton has done okay from a cardiac standpoint. She does report having occasional exertional chest pain and DOE which is relieved with rest. It has been 2 years since her last ischemic evaluation  She is anticipating surgery after the first of the year, therefore due to new or worsening symptoms, Victoria Eaton will require a follow-up visit for further pre-operative risk assessment.   Pre-op covering staff: - Please schedule appointment and call patient to inform them. Please add "pre-op clearance" to the appointment notes so provider is aware. - Please contact requesting surgeon's office via preferred method (i.e, phone, fax) to inform them of need for appointment prior to surgery.  [name], PA-C 08/01/2020, 8:30 AM

## 2020-08-01 NOTE — Telephone Encounter (Signed)
Called Left message for patient to call office to schedule appointment for clearence

## 2020-08-01 NOTE — Telephone Encounter (Signed)
Left message for patient to call [facility] to schedule appointment for Pre-op clearence

## 2020-08-02 ENCOUNTER — Other Ambulatory Visit: Payer: Self-pay

## 2020-08-02 DIAGNOSIS — F419 Anxiety disorder, unspecified: Secondary | ICD-10-CM

## 2020-08-02 DIAGNOSIS — I10 Essential (primary) hypertension: Secondary | ICD-10-CM

## 2020-08-02 DIAGNOSIS — F32A Depression, unspecified: Secondary | ICD-10-CM

## 2020-08-02 MED ORDER — LISINOPRIL 5 MG PO TABS: 5.0000 mg | ORAL_TABLET | Freq: Every day | ORAL | 1 refills | Status: DC

## 2020-08-02 MED ORDER — SERTRALINE HCL 100 MG PO TABS: ORAL_TABLET | ORAL | 0 refills | Status: DC

## 2020-08-02 NOTE — Telephone Encounter (Signed)
Pt has been scheduled to see Dr. Debara Pickett 10/22/20 for pre op clearance. Will forward these notes to MD for upcoming appt. Will remove from the pre op call back pool. Will send FYI to requesting office.

## 2020-08-02 NOTE — Telephone Encounter (Signed)
Left message for the pt to call the office to schedule a pre op appt with Dr. Debara Pickett or APP per pre op team.

## 2020-08-06 ENCOUNTER — Ambulatory Visit (INDEPENDENT_AMBULATORY_CARE_PROVIDER_SITE_OTHER): Payer: 59 | Admitting: Psychology

## 2020-08-06 DIAGNOSIS — F329 Major depressive disorder, single episode, unspecified: Secondary | ICD-10-CM

## 2020-08-06 DIAGNOSIS — F419 Anxiety disorder, unspecified: Secondary | ICD-10-CM | POA: Diagnosis not present

## 2020-08-07 ENCOUNTER — Ambulatory Visit: Payer: 59 | Admitting: Physical Therapy

## 2020-08-09 ENCOUNTER — Other Ambulatory Visit: Payer: Self-pay | Admitting: Internal Medicine

## 2020-08-23 ENCOUNTER — Ambulatory Visit (INDEPENDENT_AMBULATORY_CARE_PROVIDER_SITE_OTHER): Payer: 59 | Admitting: Psychology

## 2020-08-23 DIAGNOSIS — F419 Anxiety disorder, unspecified: Secondary | ICD-10-CM

## 2020-08-23 DIAGNOSIS — F329 Major depressive disorder, single episode, unspecified: Secondary | ICD-10-CM | POA: Diagnosis not present

## 2020-09-03 ENCOUNTER — Other Ambulatory Visit: Payer: Self-pay

## 2020-09-03 DIAGNOSIS — Z794 Long term (current) use of insulin: Secondary | ICD-10-CM

## 2020-09-03 MED ORDER — FREESTYLE LIBRE 14 DAY SENSOR MISC: 5 refills | Status: DC

## 2020-09-04 ENCOUNTER — Other Ambulatory Visit: Payer: Self-pay | Admitting: Internal Medicine

## 2020-09-04 ENCOUNTER — Ambulatory Visit (INDEPENDENT_AMBULATORY_CARE_PROVIDER_SITE_OTHER): Payer: 59 | Admitting: Psychology

## 2020-09-04 DIAGNOSIS — F419 Anxiety disorder, unspecified: Secondary | ICD-10-CM | POA: Diagnosis not present

## 2020-09-04 DIAGNOSIS — F329 Major depressive disorder, single episode, unspecified: Secondary | ICD-10-CM

## 2020-09-13 ENCOUNTER — Telehealth: Payer: Self-pay | Admitting: Family Medicine

## 2020-09-13 NOTE — Telephone Encounter (Signed)
Called pharmacy back and clarified pt does not take this

## 2020-09-13 NOTE — Telephone Encounter (Signed)
[facility] called due to getting a refill request from unknown for NOVOLOG FLEXPEN 100 UNIT/ML FlexPen /they would like to know if pt still takes this medication and needs the refill/ they have not filled this for over a year/ please advise

## 2020-09-18 ENCOUNTER — Ambulatory Visit (INDEPENDENT_AMBULATORY_CARE_PROVIDER_SITE_OTHER): Payer: 59 | Admitting: Psychology

## 2020-09-18 DIAGNOSIS — F419 Anxiety disorder, unspecified: Secondary | ICD-10-CM

## 2020-09-18 DIAGNOSIS — F329 Major depressive disorder, single episode, unspecified: Secondary | ICD-10-CM | POA: Diagnosis not present

## 2020-10-09 ENCOUNTER — Ambulatory Visit (INDEPENDENT_AMBULATORY_CARE_PROVIDER_SITE_OTHER): Payer: 59 | Admitting: Psychology

## 2020-10-09 DIAGNOSIS — F419 Anxiety disorder, unspecified: Secondary | ICD-10-CM | POA: Diagnosis not present

## 2020-10-09 DIAGNOSIS — F329 Major depressive disorder, single episode, unspecified: Secondary | ICD-10-CM

## 2020-10-22 ENCOUNTER — Other Ambulatory Visit: Payer: Self-pay

## 2020-10-22 ENCOUNTER — Encounter: Payer: Self-pay | Admitting: Internal Medicine

## 2020-10-22 ENCOUNTER — Ambulatory Visit (INDEPENDENT_AMBULATORY_CARE_PROVIDER_SITE_OTHER): Payer: 59 | Admitting: Internal Medicine

## 2020-10-22 VITALS — BP 122/72 | HR 71 | Ht 66.0 in | Wt 200.8 lb

## 2020-10-22 DIAGNOSIS — Z720 Tobacco use: Secondary | ICD-10-CM | POA: Diagnosis not present

## 2020-10-22 DIAGNOSIS — I10 Essential (primary) hypertension: Secondary | ICD-10-CM | POA: Diagnosis not present

## 2020-10-22 DIAGNOSIS — Z951 Presence of aortocoronary bypass graft: Secondary | ICD-10-CM

## 2020-10-22 DIAGNOSIS — E785 Hyperlipidemia, unspecified: Secondary | ICD-10-CM

## 2020-10-22 DIAGNOSIS — I25708 Atherosclerosis of coronary artery bypass graft(s), unspecified, with other forms of angina pectoris: Secondary | ICD-10-CM | POA: Diagnosis not present

## 2020-10-22 MED ORDER — VARENICLINE TARTRATE 0.5 MG PO TABS: 0.5000 mg | ORAL_TABLET | Freq: Two times a day (BID) | ORAL | 0 refills | Status: DC

## 2020-10-22 NOTE — Progress Notes (Signed)
OFFICE NOTE  Chief Complaint:  Follow-up  Primary Care Physician: Jacelyn Pi, Lilia Argue, MD  HPI:  Victoria Eaton is a 60 y.o. female with a past medial history significant for remote PCI the RCA in 2006, recent and STEMI and cath revealing multivessel coronary disease.  Status post CABG x4 in January 2019, readmitted in April with chest pain and noted occlusion of the graft to OM and diagonal.  Presented again with chest pain in 02/24/2018.  Troponin was elevated greater than 3, but no cardiac catheterization was performed.  Medical management was recommended.  She was seen in follow-up in the office and continued to complain of chest pain.  Her last office visit was in August 2019 at which time she continued to complain of chest pain despite adjustment of her medical regimen.  She was scheduled to see me in follow-up since I initially saw her the day after she was consulted on by the fellows, but I have not seen her since then and she has been seen by multiple partners in my group.  Ultimately underwent repeat cardiac catheterization in November 2019 which showed distally occluded LAD but patent LIMA to LAD and SVG to RCA.  The SVG to D1 and SVG to OM1 are chronically occluded.  There are no significant changes from the prior study.  Medical therapy was recommended.  Subsequently her blood pressure medications were increased including her amlodipine up to 10 mg and metoprolol up to 50 mg twice daily.  Since then she is done well without any recurrent chest pain.  Of note though she has had some recent dizziness.  Her blood pressure today was low at 95/53.  She does not follow it routinely.    04/17/2020  Victoria Eaton returns today for follow-up.  She is overall doing well from a cardiac standpoint.  She denies any chest pain or worsening shortness of breath.  She has been having some issues with her hand and is scheduled to have a nerve conduction study.  Unfortunately her husband just died back in  02-25-23.  He was diagnosed with advanced liver cancer rather suddenly and was getting treatment at [facility] for short period of time.  Victoria Eaton had acute loss of 2 bypass grafts and it was recommended for long-term dual antiplatelet therapy.  She wanted to see if we could pare back some of her medicines.  She is also on high doses of isosorbide and on Ranexa.  Again she remains asymptomatic.  Blood pressure is well controlled.  EKG shows no ischemia.  Lipids recently showed an LDL of 86 which is previously at 70.  We discussed the importance of monitoring her diet.  10/22/2020  Victoria Eaton is seen today in follow-up.  She has had a very difficult 2021.  Both of her parents passed away and her husband died within a few months after that.  Her rabbit also died.  She has been seeking counseling.  She is on 2 antidepressant medications.  She started smoking again and is interested in getting Chantix to help again with smoking cessation.  She denies any chest pain or worsening shortness of breath.  She remains on dual antiplatelet therapy.  PMHx:  Past Medical History:  Diagnosis Date  . Anxiety   . Arthritis    "maybe in my left foot" (10/14/2017)  . Bladder prolapse, female, acquired 09/14/2017  . Depression   . High cholesterol   . History of stomach ulcers   . Hypertension   .  Myocardial infarction ([place]) 12/30/2004  . Tobacco abuse   . Type II diabetes mellitus ([place])     Past Surgical History:  Procedure Laterality Date  . CARDIAC CATHETERIZATION  10/14/2017  . CORONARY ANGIOPLASTY WITH STENT PLACEMENT  12/30/2004   Patient reported  . CORONARY ARTERY BYPASS GRAFT N/A 10/19/2017   Procedure: CORONARY ARTERY BYPASS GRAFTING (CABG) times four using the right saphaneous vein. Harvested endoscopicly and left internal mammary artery.;  Surgeon: Grace Isaac, MD;  Location: [facility];  Service: Open Heart Surgery;  Laterality: N/A;  . DILATION AND CURETTAGE OF UTERUS  1980s  . LEFT HEART CATH AND  CORONARY ANGIOGRAPHY N/A 10/14/2017   Procedure: LEFT HEART CATH AND CORONARY ANGIOGRAPHY;  Surgeon: Martinique, Peter M, MD;  Location: [place] CV LAB;  Service: Cardiovascular;  Laterality: N/A;  . LEFT HEART CATH AND CORS/GRAFTS ANGIOGRAPHY N/A 01/01/2018   Procedure: LEFT HEART CATH AND CORS/GRAFTS ANGIOGRAPHY;  Surgeon: Jettie Booze, MD;  Location: [place] [facility];  Service: Cardiovascular;  Laterality: N/A;  . LEFT HEART CATH AND CORS/GRAFTS ANGIOGRAPHY N/A 08/16/2018   Procedure: LEFT HEART CATH AND CORS/GRAFTS ANGIOGRAPHY;  Surgeon: Nelva Bush, MD;  Location: [place] CV [facility];  Service: Cardiovascular;  Laterality: N/A;  . PILONIDAL CYST EXCISION  1980s  . TEE WITHOUT CARDIOVERSION N/A 10/19/2017   Procedure: TRANSESOPHAGEAL ECHOCARDIOGRAM (TEE);  Surgeon: Grace Isaac, MD;  Location: [place];  Service: Open Heart Surgery;  Laterality: N/A;    FAMHx:  Family History  Problem Relation Age of Onset  . Heart disease Father   . Hyperlipidemia Father   . Hypertension Father     SOCHx:   reports that she has been smoking cigarettes. She started smoking about 48 years ago. She has a 5.40 pack-year smoking history. She has never used smokeless tobacco. She reports current alcohol use of about 14.0 standard drinks of alcohol per week. She reports that she does not use drugs.  ALLERGIES:  Allergies  Allergen Reactions  . Januvia [Sitagliptin] Nausea And Vomiting and Other (See Comments)    Pancreatitis (this was in Epic, but patient was unaware of this)   . Metformin And Related Diarrhea  . Penicillins Other (See Comments)    Unknown childhood reaction Has patient had a PCN reaction causing immediate rash, facial/tongue/throat swelling, SOB or lightheadedness with hypotension: Unknown Has patient had a PCN reaction causing severe rash involving mucus membranes or skin necrosis: Unknown Has patient had a PCN reaction that required hospitalization: Unknown Has  patient had a PCN reaction occurring within the last 10 years: No If all of the above answers are "NO", then may proceed with Cephalosporin use.     ROS: Pertinent items noted in HPI and remainder of comprehensive ROS otherwise negative.  HOME MEDS: Current Outpatient Medications on File Prior to Visit  Medication Sig Dispense Refill  . amLODipine (NORVASC) 5 MG tablet TAKE ONE TABLET BY MOUTH ONE TIME DAILY 90 tablet 3  . aspirin 81 MG EC tablet Take 1 tablet (81 mg total) by mouth daily. 90 tablet 3  . buPROPion (WELLBUTRIN XL) 300 MG 24 hr tablet TAKE ONE TABLET BY MOUTH ONE TIME DAILY 90 tablet 1  . clopidogrel (PLAVIX) 75 MG tablet TAKE ONE TABLET BY MOUTH ONE TIME DAILY 90 tablet 2  . Continuous Blood Gluc Sensor (FREESTYLE LIBRE 14 DAY SENSOR) MISC APPLY ONE SENSOR TO THE BACK OF YOUR UPPER ARM. REPLACE EVERY 14 DAYS. 62 each 5  . Dulaglutide (TRULICITY)  0.75 MG/0.5ML SOPN Inject 0.5 mLs (0.75 mg total) into the skin once a week. 4 pen 5  . gabapentin (NEURONTIN) 100 MG capsule TAKE ONE CAPSULE BY MOUTH THREE TIMES A DAY 90 capsule 3  . insulin glargine, 2 Unit Dial, (TOUJEO MAX SOLOSTAR) 300 UNIT/ML Solostar Pen INJECT 55 UNITS UNDER THE SKIN EVERY MORNING (Patient taking differently: INJECT 55 UNITS UNDER THE SKIN EVERY MORNING Patient injects 42 units) 18 mL 2  . INVOKANA 100 MG TABS tablet TAKE ONE TABLET BY MOUTH ONE TIME DAILY BEFORE BREAKFAST 90 tablet 1  . isosorbide mononitrate (IMDUR) 120 MG 24 hr tablet Take 1 tablet (120 mg total) by mouth daily. 90 tablet 3  . lisinopril (ZESTRIL) 5 MG tablet Take 1 tablet (5 mg total) by mouth daily. 90 tablet 1  . loratadine (CLARITIN) 10 MG tablet Take 1 tablet (10 mg total) by mouth daily. 90 tablet 3  . Magnesium Hydroxide (MAGNESIA PO) Take by mouth.    . methocarbamol (ROBAXIN) 500 MG tablet Take 1 tablet (500 mg total) by mouth 2 (two) times daily as needed. 20 tablet 0  . metoprolol tartrate (LOPRESSOR) 50 MG tablet TAKE ONE  TABLET BY MOUTH TWICE A DAY 30 tablet 6  . Multiple Vitamin (MULTIVITAMIN WITH MINERALS) TABS tablet Take 1 tablet by mouth daily.    . Omega-3 Fatty Acids (FISH OIL PO) Take by mouth.    . rosuvastatin (CRESTOR) 40 MG tablet Take 1 tablet (40 mg total) by mouth daily. 90 tablet 3  . sertraline (ZOLOFT) 100 MG tablet TAKE TWO TABLETS BY MOUTH ONE TIME DAILY 180 tablet 0  . traMADol (ULTRAM) 50 MG tablet TAKE ONE TABLET BY MOUTH EVERY 6 HOURS AS NEEDED FOR UP TO 5 DAYS 20 tablet 2  . ULTICARE MICRO PEN NEEDLES 32G X 4 MM MISC USE WITH INJECTIONS ONE TIME DAILY 400 each 0  . methylPREDNISolone (MEDROL DOSEPAK) 4 MG TBPK tablet Take as directed (Patient not taking: Reported on 10/22/2020) 21 tablet 0  . nitroGLYCERIN (NITROSTAT) 0.4 MG SL tablet Place 1 tablet (0.4 mg total) under the tongue every 5 (five) minutes as needed for chest pain. 25 tablet 3  . TURMERIC PO Take 1 capsule by mouth daily.  (Patient not taking: Reported on 10/22/2020)     No current facility-administered medications on file prior to visit.    LABS/IMAGING: No results found for this or any previous visit (from the past 48 hour(s)). No results found.  LIPID PANEL:    Component Value Date/Time   CHOL 183 03/01/2020 1201   TRIG 134 03/01/2020 1201   HDL 74 03/01/2020 1201   CHOLHDL 2.5 03/01/2020 1201   CHOLHDL 2.7 08/16/2018 0341   VLDL 32 08/16/2018 0341   LDLCALC 86 03/01/2020 1201     WEIGHTS: Wt Readings from Last 3 Encounters:  10/22/20 200 lb 12.8 oz (91.1 kg)  04/17/20 (!) 216 lb (98 kg)  04/16/20 (!) 215 lb (97.5 kg)    VITALS: BP 122/72 (BP Location: Left Arm, Patient Position: Sitting)   Pulse 71   Ht 5' 6"  (1.676 m)   Wt 200 lb 12.8 oz (91.1 kg)   SpO2 93%   BMI 32.41 kg/m   EXAM: General appearance: alert and no distress Neck: no carotid bruit, no JVD and thyroid not enlarged, symmetric, no tenderness/mass/nodules Lungs: clear to auscultation bilaterally Heart: regular rate and  rhythm Abdomen: soft, non-tender; bowel sounds normal; no masses,  no organomegaly Extremities: extremities normal, atraumatic, no  cyanosis or edema Pulses: 2+ and symmetric Skin: Skin color, texture, turgor normal. No rashes or lesions Neurologic: Grossly normal Psych: Pleasant  EKG: Normal sinus rhythm at 71, possible left atrial enlargement, LVH by voltage-personally reviewed  ASSESSMENT: 1. CAD status post CABG x 4 in 09/2017 (LIMA to LAD (patent), SVG to RCA (patent), SVG-D1 (CTO) and SVG-OM1 (CTO) as of cath 07/2018 2. HTN 3. HLD 4. DM2 5. Tobacco abuse  PLAN: 1.   Ms. Robleto has again started smoking but had a very difficult year.  She is struggling with some grieving and depression but is seeing a counselor and is on medication for this.  She denies any suicidal thoughts or ideations and was counseled after starting Chantix that there is possibility for worsening of this.  Typically this was only in a younger population.  She knows to reach out if she has any of those thoughts to her counselor.  I will provide a generic prescription as there was a recent recall of the brand-name Chantix.  She denies any anginal symptoms.  Follow-up with me annually or sooner as necessary.  Pixie Casino, MD, [facility], [place] Director of the Advanced Lipid Disorders &  Cardiovascular Risk Reduction Clinic Diplomate of the American Board of Clinical Lipidology Attending Cardiologist  Direct Dial: [phone]  Fax: [phone]  Website:  [email or web address] 10/22/2020, 4:16 PM

## 2020-10-22 NOTE — Patient Instructions (Signed)
Medication Instructions:  Dr. Debara Pickett has prescribed generic Chantix  Continue all other current medications  *If you need a refill on your cardiac medications before your next appointment, please call your pharmacy*  Follow-Up: At [facility], you and your health needs are our priority.  As part of our continuing mission to provide you with exceptional heart care, we have created designated Provider Care Teams.  These Care Teams include your primary Cardiologist (physician) and Advanced Practice Providers (APPs -  Physician Assistants and Nurse Practitioners) who all work together to provide you with the care you need, when you need it.  We recommend signing up for the patient portal called "MyChart".  Sign up information is provided on this After Visit Summary.  MyChart is used to connect with patients for Virtual Visits (Telemedicine).  Patients are able to view lab/test results, encounter notes, upcoming appointments, etc.  Non-urgent messages can be sent to your provider as well.   To learn more about what you can do with MyChart, go to [email or web address].    Your next appointment:   12 month(s)  The format for your next appointment:   In Person  Provider:   You may see Pixie Casino, MD or one of the following Advanced Practice Providers on your designated Care Team:    Almyra Deforest, PA-C  Fabian Sharp, PA-C or   Roby Lofts, [place]    Other Instructions

## 2020-10-24 ENCOUNTER — Ambulatory Visit (INDEPENDENT_AMBULATORY_CARE_PROVIDER_SITE_OTHER): Payer: 59 | Admitting: Psychology

## 2020-10-24 DIAGNOSIS — F419 Anxiety disorder, unspecified: Secondary | ICD-10-CM | POA: Diagnosis not present

## 2020-10-24 DIAGNOSIS — F329 Major depressive disorder, single episode, unspecified: Secondary | ICD-10-CM

## 2020-10-31 ENCOUNTER — Other Ambulatory Visit: Payer: Self-pay | Admitting: Family Medicine

## 2020-10-31 DIAGNOSIS — I10 Essential (primary) hypertension: Secondary | ICD-10-CM

## 2020-10-31 NOTE — Telephone Encounter (Signed)
Requested medications are due for refill today. No  Requested medications are on the active medications list.  yes  Last refill.  08/02/2020  #90 1 refill  Future visit scheduled.   no  Notes to clinic.  Pt was seeing Dr. Pamella Pert.  Please advise.

## 2020-10-31 NOTE — Telephone Encounter (Signed)
Please schedule appt for med refill. No courtesy till appt is made

## 2020-11-05 ENCOUNTER — Ambulatory Visit: Payer: 59 | Admitting: Family Medicine

## 2020-11-07 ENCOUNTER — Ambulatory Visit (INDEPENDENT_AMBULATORY_CARE_PROVIDER_SITE_OTHER): Payer: 59 | Admitting: Family Medicine

## 2020-11-07 ENCOUNTER — Other Ambulatory Visit: Payer: Self-pay

## 2020-11-07 ENCOUNTER — Encounter: Payer: Self-pay | Admitting: Family Medicine

## 2020-11-07 VITALS — BP 122/70 | HR 71 | Temp 97.8°F | Ht 66.0 in | Wt 198.0 lb

## 2020-11-07 DIAGNOSIS — R197 Diarrhea, unspecified: Secondary | ICD-10-CM

## 2020-11-07 DIAGNOSIS — I214 Non-ST elevation (NSTEMI) myocardial infarction: Secondary | ICD-10-CM

## 2020-11-07 DIAGNOSIS — M542 Cervicalgia: Secondary | ICD-10-CM

## 2020-11-07 DIAGNOSIS — Z23 Encounter for immunization: Secondary | ICD-10-CM

## 2020-11-07 DIAGNOSIS — E118 Type 2 diabetes mellitus with unspecified complications: Secondary | ICD-10-CM

## 2020-11-07 DIAGNOSIS — M65331 Trigger finger, right middle finger: Secondary | ICD-10-CM

## 2020-11-07 DIAGNOSIS — I10 Essential (primary) hypertension: Secondary | ICD-10-CM | POA: Diagnosis not present

## 2020-11-07 DIAGNOSIS — G5603 Carpal tunnel syndrome, bilateral upper limbs: Secondary | ICD-10-CM

## 2020-11-07 DIAGNOSIS — Z794 Long term (current) use of insulin: Secondary | ICD-10-CM

## 2020-11-07 MED ORDER — TRULICITY 1.5 MG/0.5ML ~~LOC~~ SOAJ: 1.5000 mg | SUBCUTANEOUS | 6 refills | Status: DC

## 2020-11-07 MED ORDER — CLOPIDOGREL BISULFATE 75 MG PO TABS: 75.0000 mg | ORAL_TABLET | Freq: Every day | ORAL | 2 refills | Status: DC

## 2020-11-07 MED ORDER — ULTICARE MICRO PEN NEEDLES 32G X 4 MM MISC
0 refills | Status: DC
Start: 2020-11-07 — End: 2021-10-23

## 2020-11-07 MED ORDER — ROSUVASTATIN CALCIUM 40 MG PO TABS: 40.0000 mg | ORAL_TABLET | Freq: Every day | ORAL | 3 refills | Status: DC

## 2020-11-07 MED ORDER — CANAGLIFLOZIN 100 MG PO TABS: ORAL_TABLET | ORAL | 1 refills | Status: DC

## 2020-11-07 MED ORDER — CHOLESTYRAMINE 4 GM/DOSE PO POWD: 2.0000 g | Freq: Two times a day (BID) | ORAL | 11 refills | Status: DC

## 2020-11-07 MED ORDER — LISINOPRIL 5 MG PO TABS: 5.0000 mg | ORAL_TABLET | Freq: Every day | ORAL | 1 refills | Status: DC

## 2020-11-07 MED ORDER — METHOCARBAMOL 500 MG PO TABS: 500.0000 mg | ORAL_TABLET | Freq: Two times a day (BID) | ORAL | 0 refills | Status: DC | PRN

## 2020-11-07 NOTE — Patient Instructions (Addendum)
Health Maintenance, Female Adopting a healthy lifestyle and getting preventive care are important in promoting health and wellness. Ask your health care provider about:  The right schedule for you to have regular tests and exams.  Things you can do on your own to prevent diseases and keep yourself healthy. What should I know about diet, weight, and exercise? Eat a healthy diet  Eat a diet that includes plenty of vegetables, fruits, low-fat dairy products, and lean protein.  Do not eat a lot of foods that are high in solid fats, added sugars, or sodium.   Maintain a healthy weight Body mass index (BMI) is used to identify weight problems. It estimates body fat based on height and weight. Your health care provider can help determine your BMI and help you achieve or maintain a healthy weight. Get regular exercise Get regular exercise. This is one of the most important things you can do for your health. Most adults should:  Exercise for at least 150 minutes each week. The exercise should increase your heart rate and make you sweat (moderate-intensity exercise).  Do strengthening exercises at least twice a week. This is in addition to the moderate-intensity exercise.  Spend less time sitting. Even light physical activity can be beneficial. Watch cholesterol and blood lipids Have your blood tested for lipids and cholesterol at 62 years of age, then have this test every 5 years. Have your cholesterol levels checked more often if:  Your lipid or cholesterol levels are high.  You are older than 62 years of age.  You are at high risk for heart disease. What should I know about cancer screening? Depending on your health history and family history, you may need to have cancer screening at various ages. This may include screening for:  Breast cancer.  Cervical cancer.  Colorectal cancer.  Skin cancer.  Lung cancer. What should I know about heart disease, diabetes, and high blood  pressure? Blood pressure and heart disease  High blood pressure causes heart disease and increases the risk of stroke. This is more likely to develop in people who have high blood pressure readings, are of African descent, or are overweight.  Have your blood pressure checked: ? Every 3-5 years if you are 62-18 years of age. ? Every year if you are 9 years old or older. Diabetes Have regular diabetes screenings. This checks your fasting blood sugar level. Have the screening done:  Once every three years after age 21 if you are at a normal weight and have a low risk for diabetes.  More often and at a younger age if you are overweight or have a high risk for diabetes. What should I know about preventing infection? Hepatitis B If you have a higher risk for hepatitis B, you should be screened for this virus. Talk with your health care provider to find out if you are at risk for hepatitis B infection. Hepatitis C Testing is recommended for:  Everyone born from [birth year] through 1967.  Anyone with known risk factors for hepatitis C. Sexually transmitted infections (STIs)  Get screened for STIs, including gonorrhea and chlamydia, if: ? You are sexually active and are younger than 62 years of age. ? You are older than 62 years of age and your health care provider tells you that you are at risk for this type of infection. ? Your sexual activity has changed since you were last screened, and you are at increased risk for chlamydia or gonorrhea. Ask your health  care provider if you are at risk.  Ask your health care provider about whether you are at high risk for HIV. Your health care provider may recommend a prescription medicine to help prevent HIV infection. If you choose to take medicine to prevent HIV, you should first get tested for HIV. You should then be tested every 3 months for as long as you are taking the medicine. Pregnancy  If you are about to stop having your period (premenopausal) and  you may become pregnant, seek counseling before you get pregnant.  Take 400 to 800 micrograms (mcg) of folic acid every day if you become pregnant.  Ask for birth control (contraception) if you want to prevent pregnancy. Osteoporosis and menopause Osteoporosis is a disease in which the bones lose minerals and strength with aging. This can result in bone fractures. If you are 62 years old or older, or if you are at risk for osteoporosis and fractures, ask your health care provider if you should:  Be screened for bone loss.  Take a calcium or vitamin D supplement to lower your risk of fractures.  Be given hormone replacement therapy (HRT) to treat symptoms of menopause. Follow these instructions at home: Lifestyle  Do not use any products that contain nicotine or tobacco, such as cigarettes, e-cigarettes, and chewing tobacco. If you need help quitting, ask your health care provider.  Do not use street drugs.  Do not share needles.  Ask your health care provider for help if you need support or information about quitting drugs. Alcohol use  Do not drink alcohol if: ? Your health care provider tells you not to drink. ? You are pregnant, may be pregnant, or are planning to become pregnant.  If you drink alcohol: ? Limit how much you use to 0-1 drink a day. ? Limit intake if you are breastfeeding.  Be aware of how much alcohol is in your drink. In the [place] one drink equals one 12 oz bottle of beer (355 mL), one 5 oz glass of wine (148 mL), or one 1 oz glass of hard liquor (44 mL). General instructions  Schedule regular health, dental, and eye exams.  Stay current with your vaccines.  Tell your health care provider if: ? You often feel depressed. ? You have ever been abused or do not feel safe at home. Summary  Adopting a healthy lifestyle and getting preventive care are important in promoting health and wellness.  Follow your health care provider's instructions about healthy  diet, exercising, and getting tested or screened for diseases.  Follow your health care provider's instructions on monitoring your cholesterol and blood pressure. This information is not intended to replace advice given to you by your health care provider. Make sure you discuss any questions you have with your health care provider. Document Revised: 09/01/2018 Document Reviewed: 09/01/2018 Elsevier Patient Education  2021 [employer].   If you have lab work done today you will be contacted with your lab results within the next 2 weeks.  If you have not heard from [place] then please contact us. The fastest way to get your results is to register for My Chart.   IF you received an x-ray today, you will receive an invoice from [facility]. Please contact [facility] at [phone] with questions or concerns regarding your invoice.   IF you received labwork today, you will receive an invoice from [facility]. Please contact [facility] at [phone] with questions or concerns regarding your invoice.   Our billing  staff will not be able to assist you with questions regarding bills from these companies.  You will be contacted with the lab results as soon as they are available. The fastest way to get your results is to activate your My Chart account. Instructions are located on the last page of this paperwork. If you have not heard from [place] regarding the results in 2 weeks, please contact this office.

## 2020-11-07 NOTE — Progress Notes (Signed)
2/16/20224:38 PM  [name] August 10, 1961, 60 y.o., female [id number]  Chief Complaint  Patient presents with  . Diabetes    Had recent via virta A1c 7.8 and lipid panel  . Medication Refill    HPI:   Patient is a 60 y.o. female with past medical history significant for HLD, HTN, NSTEMI,  DM who presents today for medication refills   Lipid Panel[303756]Collected: 11/05/2020 11:42 PMSpecimen Received: 11/05/2020 05:00 AM  Cholesterol, Total [001065] 175 mg/dL Normal 100-199 mg/dL  Triglycerides [001172] 182 mg/dL H 0-149 mg/dL  HDL Cholesterol [011817] 77 mg/dL Normal >39 mg/dL  VLDL Cholesterol Cal [011919] 30 mg/dL Normal 5-40 mg/dL  LDL Chol Calc (NIH) [012059] 68 mg/dL Normal 0-99 mg/dL  Hemoglobin A1c[001453]Collected: 11/05/2020 11:42 PMSpecimen Received: 11/05/2020 05:00 AM  Hemoglobin A1c [001481] 7.8 %       Sees a provider thru [place] They do her labs frequently and make medication suggestions They increased her Trulicity from .75 to 1.5, will continue to monitor Takes imodium still has issues with diarrhea every day Is wondering if there are other options Has been evaluated by GI in the past Declines colonoscopy or gi referral at this time   Frequently Wakes up with headaches, feeling fatigued, and a dry mouth Doesn't want to wear a cpap Declines sleep medicine referral at this time  May need carpal tunnel surgery  HTN/ Chest pain: managed by cardiology Amlodipine 5 mg Isosorbide 142m Nitro SL Metoprolol 50 bid Ranexa 5055mbid Aspirin daily Clopidogrel Lisinopirl 5 mg Crestor 404mAnxiety and Depression Bupropion Zoloft  Pain Methocarbamol Tramadol  Claritin: allergies Questran diarrhea  Diabetes Trulicity 1.5 Glargine 55 units daily Invokana 100m39mealth Maintenance  Topic Date Due  . FOOT EXAM  05/22/2020  . OPHTHALMOLOGY EXAM  11/07/2020 (Originally 08/29/2020)  . HEMOGLOBIN A1C  01/21/2021   . MAMMOGRAM  09/05/2021  . PAP SMEAR-Modifier  09/05/2022  . COLONOSCOPY (Pts [id number] coverage will need to be confirmed)  05/01/2025  . TETANUS/TDAP  04/16/2030  . INFLUENZA VACCINE  Completed  . PNEUMOCOCCAL POLYSACCHARIDE VACCINE AGE 17-64 HIGH RISK  Completed  . COVID-19 Vaccine  Completed  . Hepatitis C Screening  Completed  . HIV Screening  Completed      Depression screen PHQ [facility] 04/16/2020 01/27/2020  Decreased Interest 0 1 3  Down, Depressed, Hopeless 0 1 3  PHQ - 2 Score 0 2 6  Altered sleeping - - 3  Tired, decreased energy - - 3  Change in appetite - - 2  Feeling bad or failure about yourself  - - 3  Trouble concentrating - - 3  Moving slowly or fidgety/restless - - 2  Suicidal thoughts - - 0  PHQ-9 Score - - 22  Difficult doing work/chores - - Very difficult  Some recent data might be hidden    Fall Risk  11/07/2020 04/16/2020 08/22/2019 05/23/2019 01/14/2019  Falls in the past year? 1 0 0 0 1  Number falls in past yr: 0 0 0 - 0  Injury with Fall? 0 0 0 0 0  Comment - - - - -  Follow up Falls evaluation completed Falls evaluation completed Falls evaluation completed - -     Allergies  Allergen Reactions  . Januvia [Sitagliptin] Nausea And Vomiting and Other (See Comments)    Pancreatitis (this was in Epic, but patient was unaware of this)   . Metformin And Related Diarrhea  . Penicillins Other (See Comments)  Unknown childhood reaction Has patient had a PCN reaction causing immediate rash, facial/tongue/throat swelling, SOB or lightheadedness with hypotension: Unknown Has patient had a PCN reaction causing severe rash involving mucus membranes or skin necrosis: Unknown Has patient had a PCN reaction that required hospitalization: Unknown Has patient had a PCN reaction occurring within the last 10 years: No If all of the above answers are "NO", then may proceed with Cephalosporin use.     Prior to Admission medications   Medication  Sig Start Date End Date Taking? Authorizing Provider  ranolazine (RANEXA) 500 MG 12 hr tablet Take 500 mg by mouth 2 (two) times daily.   Yes [provider]  amLODipine (NORVASC) 5 MG tablet TAKE ONE TABLET BY MOUTH ONE TIME DAILY 09/04/20   Pixie Casino, MD  aspirin 81 MG EC tablet Take 1 tablet (81 mg total) by mouth daily. 02/11/18   Ledora Bottcher, PA  buPROPion (WELLBUTRIN XL) 300 MG 24 hr tablet TAKE ONE TABLET BY MOUTH ONE TIME DAILY 05/04/20   Jacelyn Pi, Lilia Argue, MD  clopidogrel (PLAVIX) 75 MG tablet TAKE ONE TABLET BY MOUTH ONE TIME DAILY 06/15/20   Jacelyn Pi, Lilia Argue, MD  Continuous Blood Gluc Sensor (FREESTYLE LIBRE 14 DAY SENSOR) MISC APPLY ONE SENSOR TO THE BACK OF YOUR UPPER ARM. REPLACE EVERY 14 DAYS. 09/03/20   Horald Pollen, MD  Dulaglutide (TRULICITY) 7.01 XB/9.3JQ SOPN Inject 0.5 mLs (0.75 mg total) into the skin once a week. 04/16/20   Daleen Squibb, MD  gabapentin (NEURONTIN) 100 MG capsule TAKE ONE CAPSULE BY MOUTH THREE TIMES A DAY 05/04/20   Maximiano Coss, NP  insulin glargine, 2 Unit Dial, (TOUJEO MAX SOLOSTAR) 300 UNIT/ML Solostar Pen INJECT 55 UNITS UNDER THE SKIN EVERY MORNING Patient taking differently: INJECT 55 UNITS UNDER THE SKIN EVERY MORNING Patient injects 42 units 07/27/20   Atilano Covelli, Laurita Quint, FNP  INVOKANA 100 MG TABS tablet TAKE ONE TABLET BY MOUTH ONE TIME DAILY BEFORE BREAKFAST 05/04/20   Jacelyn Pi, Lilia Argue, MD  isosorbide mononitrate (IMDUR) 120 MG 24 hr tablet Take 1 tablet (120 mg total) by mouth daily. 04/24/20   Hilty, Nadean Corwin, MD  lisinopril (ZESTRIL) 5 MG tablet Take 1 tablet (5 mg total) by mouth daily. 08/02/20   Tysha Grismore, Laurita Quint, FNP  loratadine (CLARITIN) 10 MG tablet Take 1 tablet (10 mg total) by mouth daily. 05/01/17   Tenna Delaine D, PA-C  Magnesium Hydroxide (MAGNESIA PO) Take by mouth.    [provider]  methocarbamol (ROBAXIN) 500 MG tablet Take 1 tablet (500 mg total) by mouth 2 (two) times  daily as needed. 07/24/20   Aundra Dubin, PA-C  methylPREDNISolone (MEDROL DOSEPAK) 4 MG TBPK tablet Take as directed Patient not taking: Reported on 10/22/2020 07/24/20   Aundra Dubin, PA-C  metoprolol tartrate (LOPRESSOR) 50 MG tablet TAKE ONE TABLET BY MOUTH TWICE A DAY 08/10/20   Hilty, Nadean Corwin, MD  Multiple Vitamin (MULTIVITAMIN WITH MINERALS) TABS tablet Take 1 tablet by mouth daily.    [provider]  nitroGLYCERIN (NITROSTAT) 0.4 MG SL tablet Place 1 tablet (0.4 mg total) under the tongue every 5 (five) minutes as needed for chest pain. 01/30/20 04/29/20  HiltyNadean Corwin, MD  Omega-3 Fatty Acids (FISH OIL PO) Take by mouth.    [provider]  rosuvastatin (CRESTOR) 40 MG tablet Take 1 tablet (40 mg total) by mouth daily. 03/08/20   Daleen Squibb, MD  sertraline (ZOLOFT) 100 MG tablet TAKE TWO TABLETS BY MOUTH ONE TIME DAILY 08/02/20   Raianna Slight, Laurita Quint, FNP  traMADol (ULTRAM) 50 MG tablet TAKE ONE TABLET BY MOUTH EVERY 6 HOURS AS NEEDED FOR UP TO 5 DAYS 01/27/20   Jacelyn Pi, Lilia Argue, MD  TURMERIC PO Take 1 capsule by mouth daily.  Patient not taking: Reported on 10/22/2020    [provider]  ULTICARE MICRO PEN NEEDLES 32G X 4 MM MISC USE WITH INJECTIONS ONE TIME DAILY 09/08/19   Jacelyn Pi, Lilia Argue, MD  varenicline (CHANTIX) 0.5 MG tablet Take 1 tablet (0.5 mg total) by mouth 2 (two) times daily. When 1 week left, contact office for continuing dose pack 10/22/20   Hilty, Nadean Corwin, MD    Past Medical History:  Diagnosis Date  . Anxiety   . Arthritis    "maybe in my left foot" (10/14/2017)  . Bladder prolapse, female, acquired 09/14/2017  . Depression   . High cholesterol   . History of stomach ulcers   . Hypertension   . Myocardial infarction ([place]) 12/30/2004  . Tobacco abuse   . Type II diabetes mellitus ([place])     Past Surgical History:  Procedure Laterality Date  . CARDIAC CATHETERIZATION  10/14/2017  . CORONARY ANGIOPLASTY WITH STENT  PLACEMENT  12/30/2004   Patient reported  . CORONARY ARTERY BYPASS GRAFT N/A 10/19/2017   Procedure: CORONARY ARTERY BYPASS GRAFTING (CABG) times four using the right saphaneous vein. Harvested endoscopicly and left internal mammary artery.;  Surgeon: Grace Isaac, MD;  Location: [facility];  Service: Open Heart Surgery;  Laterality: N/A;  . DILATION AND CURETTAGE OF UTERUS  1980s  . LEFT HEART CATH AND CORONARY ANGIOGRAPHY N/A 10/14/2017   Procedure: LEFT HEART CATH AND CORONARY ANGIOGRAPHY;  Surgeon: Martinique, Peter M, MD;  Location: [facility] [facility];  Service: Cardiovascular;  Laterality: N/A;  . LEFT HEART CATH AND CORS/GRAFTS ANGIOGRAPHY N/A 01/01/2018   Procedure: LEFT HEART CATH AND CORS/GRAFTS ANGIOGRAPHY;  Surgeon: Jettie Booze, MD;  Location: [facility];  Service: Cardiovascular;  Laterality: N/A;  . LEFT HEART CATH AND CORS/GRAFTS ANGIOGRAPHY N/A 08/16/2018   Procedure: LEFT HEART CATH AND CORS/GRAFTS ANGIOGRAPHY;  Surgeon: Nelva Bush, MD;  Location: [facility];  Service: Cardiovascular;  Laterality: N/A;  . PILONIDAL CYST EXCISION  1980s  . TEE WITHOUT CARDIOVERSION N/A 10/19/2017   Procedure: TRANSESOPHAGEAL ECHOCARDIOGRAM (TEE);  Surgeon: Grace Isaac, MD;  Location: [facility];  Service: Open Heart Surgery;  Laterality: N/A;    Social History   Tobacco Use  . Smoking status: Current Some Day Smoker    Packs/day: 0.12    Years: 45.00    Pack years: 5.40    Types: Cigarettes    Start date: 05/01/1972    Last attempt to quit: 09/30/2017    Years since quitting: 3.1  . Smokeless tobacco: Never Used  Substance Use Topics  . Alcohol use: Yes    Alcohol/week: 14.0 standard drinks    Types: 14 Glasses of wine per week    Comment: 2 glasses red wine at night    Family History  Problem Relation Age of Onset  . Heart disease Father   . Hyperlipidemia Father   . Hypertension Father     Review of Systems  Constitutional: Negative for chills, fever  and malaise/fatigue.  HENT: Positive for congestion.   Eyes: Negative for blurred vision and double vision.  Respiratory: Negative for cough, shortness  of breath and wheezing.   Cardiovascular: Negative for chest pain, palpitations and leg swelling.  Gastrointestinal: Positive for diarrhea. Negative for abdominal pain, blood in stool, constipation, heartburn, nausea and vomiting.  Genitourinary: Negative for dysuria, frequency and hematuria.  Musculoskeletal: Negative for back pain and joint pain.  Skin: Negative for rash.  Neurological: Negative for dizziness, weakness and headaches.     OBJECTIVE:  Today's Vitals   11/07/20 1430  BP: 122/70  Pulse: 71  Temp: 97.8 F (36.6 C)  SpO2: 95%  Weight: 198 lb (89.8 kg)  Height: 5' 6"  (1.676 m)   Body mass index is 31.96 kg/m.   Physical Exam Constitutional:      General: She is not in acute distress.    Appearance: Normal appearance. She is not ill-appearing.  HENT:     Head: Normocephalic.  Cardiovascular:     Rate and Rhythm: Normal rate and regular rhythm.     Pulses: Normal pulses.     Heart sounds: Normal heart sounds. No murmur heard. No friction rub. No gallop.   Pulmonary:     Effort: Pulmonary effort is normal. No respiratory distress.     Breath sounds: Normal breath sounds. No stridor. No wheezing, rhonchi or rales.  Abdominal:     General: Bowel sounds are normal. There is no distension.     Palpations: Abdomen is soft. There is no mass.     Tenderness: There is no abdominal tenderness. There is no guarding or rebound.  Musculoskeletal:     Right lower leg: No edema.     Left lower leg: No edema.  Skin:    General: Skin is warm and dry.  Neurological:     Mental Status: She is alert and oriented to person, place, and time.  Psychiatric:        Mood and Affect: Mood normal.        Behavior: Behavior normal.     No results found for this or any previous visit (from the past 24 hour(s)).  No results  found.   ASSESSMENT and PLAN  Problem List Items Addressed This Visit      Cardiovascular and Mediastinum   Essential hypertension (Chronic)   Relevant Medications   ranolazine (RANEXA) 500 MG 12 hr tablet   lisinopril (ZESTRIL) 5 MG tablet   rosuvastatin (CRESTOR) 40 MG tablet   cholestyramine (QUESTRAN) 4 GM/DOSE powder   Non-ST elevation (NSTEMI) myocardial infarction (HCC) - Primary   Relevant Medications   ranolazine (RANEXA) 500 MG 12 hr tablet   clopidogrel (PLAVIX) 75 MG tablet   lisinopril (ZESTRIL) 5 MG tablet   rosuvastatin (CRESTOR) 40 MG tablet   cholestyramine (QUESTRAN) 4 GM/DOSE powder     Endocrine   Type 2 diabetes mellitus with complication, with long-term current use of insulin (HCC)   Relevant Medications   canagliflozin (INVOKANA) 100 MG TABS tablet   Insulin Pen Needle (ULTICARE MICRO PEN NEEDLES) 32G X 4 MM MISC   lisinopril (ZESTRIL) 5 MG tablet   Dulaglutide (TRULICITY) 1.5 VH/8.4ON SOPN   rosuvastatin (CRESTOR) 40 MG tablet    Other Visit Diagnoses    Cervicalgia       Relevant Medications   methocarbamol (ROBAXIN) 500 MG tablet   Bilateral carpal tunnel syndrome       Relevant Medications   methocarbamol (ROBAXIN) 500 MG tablet   Trigger finger, right middle finger       Relevant Medications   methocarbamol (ROBAXIN) 500 MG tablet   Encounter  for vaccination       Relevant Orders   Flu Vaccine QUAD 36+ mos IM (Completed)   Diarrhea, unspecified type       Relevant Medications   cholestyramine (QUESTRAN) 4 GM/DOSE powder       Plan . Will fill follow up with questran, encouraged to follow up with GI . Encouraged to follow up with sleep medicine, declined at this time . Will follow up with A1c, continue current diabetes regimen . Medication refills sent as needed   Return in about 3 months (around 02/04/2021).    Huston Foley Tyrena Gohr, FNP-BC [facility] at [place], [place] [address] Ph.  [phone] Fax  [phone]

## 2020-11-15 ENCOUNTER — Ambulatory Visit (INDEPENDENT_AMBULATORY_CARE_PROVIDER_SITE_OTHER): Payer: 59 | Admitting: Psychology

## 2020-11-15 DIAGNOSIS — F329 Major depressive disorder, single episode, unspecified: Secondary | ICD-10-CM | POA: Diagnosis not present

## 2020-11-15 DIAGNOSIS — F419 Anxiety disorder, unspecified: Secondary | ICD-10-CM | POA: Diagnosis not present

## 2020-11-26 ENCOUNTER — Encounter (HOSPITAL_BASED_OUTPATIENT_CLINIC_OR_DEPARTMENT_OTHER): Payer: Self-pay | Admitting: *Deleted

## 2020-11-26 ENCOUNTER — Emergency Department (HOSPITAL_BASED_OUTPATIENT_CLINIC_OR_DEPARTMENT_OTHER)
Admission: EM | Admit: 2020-11-26 | Discharge: 2020-11-27 | Disposition: A | Discharge status: Home / Self Care | Payer: 59 | Attending: Emergency Medicine | Admitting: Emergency Medicine

## 2020-11-26 ENCOUNTER — Other Ambulatory Visit: Payer: Self-pay

## 2020-11-26 DIAGNOSIS — R1013 Epigastric pain: Secondary | ICD-10-CM | POA: Diagnosis present

## 2020-11-26 DIAGNOSIS — E119 Type 2 diabetes mellitus without complications: Secondary | ICD-10-CM | POA: Diagnosis not present

## 2020-11-26 DIAGNOSIS — Z7982 Long term (current) use of aspirin: Secondary | ICD-10-CM | POA: Diagnosis not present

## 2020-11-26 DIAGNOSIS — K8050 Calculus of bile duct without cholangitis or cholecystitis without obstruction: Secondary | ICD-10-CM | POA: Insufficient documentation

## 2020-11-26 DIAGNOSIS — Z79899 Other long term (current) drug therapy: Secondary | ICD-10-CM | POA: Diagnosis not present

## 2020-11-26 DIAGNOSIS — I10 Essential (primary) hypertension: Secondary | ICD-10-CM | POA: Diagnosis not present

## 2020-11-26 DIAGNOSIS — F17210 Nicotine dependence, cigarettes, uncomplicated: Secondary | ICD-10-CM | POA: Diagnosis not present

## 2020-11-26 DIAGNOSIS — Z951 Presence of aortocoronary bypass graft: Secondary | ICD-10-CM | POA: Diagnosis not present

## 2020-11-26 DIAGNOSIS — Z7984 Long term (current) use of oral hypoglycemic drugs: Secondary | ICD-10-CM | POA: Insufficient documentation

## 2020-11-26 DIAGNOSIS — Z955 Presence of coronary angioplasty implant and graft: Secondary | ICD-10-CM | POA: Insufficient documentation

## 2020-11-26 DIAGNOSIS — R1011 Right upper quadrant pain: Secondary | ICD-10-CM

## 2020-11-26 DIAGNOSIS — Z794 Long term (current) use of insulin: Secondary | ICD-10-CM | POA: Insufficient documentation

## 2020-11-26 LAB — COMPREHENSIVE METABOLIC PANEL
ALT: 21 U/L (ref 0–44)
AST: 19 U/L (ref 15–41)
Albumin: 3.7 g/dL (ref 3.5–5.0)
Alkaline Phosphatase: 63 U/L (ref 38–126)
Anion gap: 11 (ref 5–15)
BUN: 24 mg/dL — ABNORMAL HIGH (ref 8–23)
CO2: 23 mmol/L (ref 22–32)
Calcium: 9.3 mg/dL (ref 8.9–10.3)
Chloride: 101 mmol/L (ref 98–111)
Creatinine, Ser: 1.1 mg/dL — ABNORMAL HIGH (ref 0.44–1.00)
GFR, Estimated: 57 mL/min — ABNORMAL LOW (ref >60)
Glucose, Bld: 146 mg/dL — ABNORMAL HIGH (ref 70–99)
Potassium: 3.7 mmol/L (ref 3.5–5.1)
Sodium: 135 mmol/L (ref 135–145)
Total Bilirubin: 0.4 mg/dL (ref 0.3–1.2)
Total Protein: 7.6 g/dL (ref 6.5–8.1)

## 2020-11-26 LAB — TROPONIN I (HIGH SENSITIVITY): Troponin I (High Sensitivity): 25 ng/L — ABNORMAL HIGH (ref <18)

## 2020-11-26 LAB — CBC WITH DIFFERENTIAL/PLATELET
Abs Immature Granulocytes: 0.05 10*3/uL (ref 0.00–0.07)
Basophils Absolute: 0 10*3/uL (ref 0.0–0.1)
Basophils Relative: 0 %
Eosinophils Absolute: 0.4 10*3/uL (ref 0.0–0.5)
Eosinophils Relative: 4 %
HCT: 36.5 % (ref 36.0–46.0)
Hemoglobin: 11.4 g/dL — ABNORMAL LOW (ref 12.0–15.0)
Immature Granulocytes: 1 %
Lymphocytes Relative: 17 %
Lymphs Abs: 1.5 10*3/uL (ref 0.7–4.0)
MCH: 23.9 pg — ABNORMAL LOW (ref 26.0–34.0)
MCHC: 31.2 g/dL (ref 30.0–36.0)
MCV: 76.7 fL — ABNORMAL LOW (ref 80.0–100.0)
Monocytes Absolute: 0.7 10*3/uL (ref 0.1–1.0)
Monocytes Relative: 8 %
Neutro Abs: 6.2 10*3/uL (ref 1.7–7.7)
Neutrophils Relative %: 70 %
Platelets: 183 10*3/uL (ref 150–400)
RBC: 4.76 MIL/uL (ref 3.87–5.11)
RDW: 18.6 % — ABNORMAL HIGH (ref 11.5–15.5)
WBC: 8.9 10*3/uL (ref 4.0–10.5)
nRBC: 0 % (ref 0.0–0.2)

## 2020-11-26 LAB — LIPASE, BLOOD: Lipase: 36 U/L (ref 11–51)

## 2020-11-26 MED ORDER — FENTANYL CITRATE (PF) 100 MCG/2ML IJ SOLN
100.0000 ug | Freq: Once | INTRAMUSCULAR | Status: AC
Start: 2020-11-26 — End: 2020-11-26
  Administered 2020-11-26: 100 ug via INTRAVENOUS
  Filled 2020-11-26: qty 2

## 2020-11-26 MED ORDER — PANTOPRAZOLE SODIUM 40 MG IV SOLR
40.0000 mg | Freq: Once | INTRAVENOUS | Status: AC
  Administered 2020-11-27: 40 mg via INTRAVENOUS
  Filled 2020-11-26: qty 40

## 2020-11-26 MED ORDER — ONDANSETRON HCL 4 MG/2ML IJ SOLN
4.0000 mg | Freq: Once | INTRAMUSCULAR | Status: AC
  Administered 2020-11-26: 4 mg via INTRAVENOUS
  Filled 2020-11-26: qty 2

## 2020-11-26 NOTE — ED Provider Notes (Signed)
[facility] Provider Note: Victoria Spurling, MD, FACEP  CSN: [id number] MRN: [id number] ARRIVAL: 11/26/20 at 2217 ROOM: [place]  Abdominal Pain   HISTORY OF PRESENT ILLNESS  11/26/20 10:47 PM Victoria Eaton is a 60 y.o. female with epigastric and right upper quadrant pain that began about 4 PM.  She describes the pain as feeling like she did 1000 set ups and then someone strapped a strap around her abdomen and is constricting her.  She rates the pain as a 9 out of 10, worse with palpation or movement but not with deep breathing.  She has had some intermittent nausea but no vomiting.  She has chronic diarrhea which has not acutely changed.  She has not noticed any significant difference with eating or drinking.  She has not had shortness of breath.   Past Medical History:  Diagnosis Date  . Anxiety   . Arthritis    "maybe in my left foot" (10/14/2017)  . Bladder prolapse, female, acquired 09/14/2017  . Depression   . High cholesterol   . History of stomach ulcers   . Hypertension   . Myocardial infarction ([place]) 12/30/2004  . Tobacco abuse   . Type II diabetes mellitus ([place])     Past Surgical History:  Procedure Laterality Date  . CARDIAC CATHETERIZATION  10/14/2017  . CORONARY ANGIOPLASTY WITH STENT PLACEMENT  12/30/2004   Patient reported  . CORONARY ARTERY BYPASS GRAFT N/A 10/19/2017   Procedure: CORONARY ARTERY BYPASS GRAFTING (CABG) times four using the right saphaneous vein. Harvested endoscopicly and left internal mammary artery.;  Surgeon: Grace Isaac, MD;  Location: [facility];  Service: Open Heart Surgery;  Laterality: N/A;  . DILATION AND CURETTAGE OF UTERUS  1980s  . LEFT HEART CATH AND CORONARY ANGIOGRAPHY N/A 10/14/2017   Procedure: LEFT HEART CATH AND CORONARY ANGIOGRAPHY;  Surgeon: Martinique, Peter M, MD;  Location: [facility];  Service: Cardiovascular;  Laterality: N/A;  . LEFT HEART CATH AND CORS/GRAFTS ANGIOGRAPHY N/A  01/01/2018   Procedure: LEFT HEART CATH AND CORS/GRAFTS ANGIOGRAPHY;  Surgeon: Jettie Booze, MD;  Location: [facility];  Service: Cardiovascular;  Laterality: N/A;  . LEFT HEART CATH AND CORS/GRAFTS ANGIOGRAPHY N/A 08/16/2018   Procedure: LEFT HEART CATH AND CORS/GRAFTS ANGIOGRAPHY;  Surgeon: Nelva Bush, MD;  Location: [facility];  Service: Cardiovascular;  Laterality: N/A;  . PILONIDAL CYST EXCISION  1980s  . TEE WITHOUT CARDIOVERSION N/A 10/19/2017   Procedure: TRANSESOPHAGEAL ECHOCARDIOGRAM (TEE);  Surgeon: Grace Isaac, MD;  Location: [facility];  Service: Open Heart Surgery;  Laterality: N/A;    Family History  Problem Relation Age of Onset  . Heart disease Father   . Hyperlipidemia Father   . Hypertension Father     Social History   Tobacco Use  . Smoking status: Current Some Day Smoker    Packs/day: 0.12    Years: 45.00    Pack years: 5.40    Types: Cigarettes    Start date: 05/01/1972    Last attempt to quit: 09/30/2017    Years since quitting: 3.1  . Smokeless tobacco: Never Used  Vaping Use  . Vaping Use: Never used  Substance Use Topics  . Alcohol use: Yes    Alcohol/week: 14.0 standard drinks    Types: 14 Glasses of wine per week    Comment: 2 glasses red wine at night  . Drug use: No    Prior to Admission medications  Medication Sig Start Date End Date Taking? Authorizing Provider  ondansetron (ZOFRAN ODT) 8 MG disintegrating tablet Take 1 tablet (8 mg total) by mouth every 8 (eight) hours as needed for nausea or vomiting. 11/27/20  Yes Elihue Ebert, MD  oxyCODONE-acetaminophen (PERCOCET) 10-325 MG tablet Take 1 tablet by mouth every 6 (six) hours as needed for pain. 11/27/20  Yes Jayra Choyce, MD  amLODipine (NORVASC) 5 MG tablet TAKE ONE TABLET BY MOUTH ONE TIME DAILY 09/04/20   Pixie Casino, MD  aspirin 81 MG EC tablet Take 1 tablet (81 mg total) by mouth daily. 02/11/18   Ledora Bottcher, PA  buPROPion (WELLBUTRIN XL) 300 MG  24 hr tablet TAKE ONE TABLET BY MOUTH ONE TIME DAILY 05/04/20   Jacelyn Pi, Lilia Argue, MD  canagliflozin [facility]) 100 MG TABS tablet TAKE ONE TABLET BY MOUTH ONE TIME DAILY BEFORE BREAKFAST 11/07/20   Just, Laurita Quint, FNP  cholestyramine Lucrezia Starch) 4 GM/DOSE powder Take 0.5-1 packets (2-4 g total) by mouth 2 (two) times daily with a meal. 11/07/20 11/07/21  Just, Laurita Quint, FNP  clopidogrel (PLAVIX) 75 MG tablet Take 1 tablet (75 mg total) by mouth daily. 11/07/20   Just, Laurita Quint, FNP  Continuous Blood Gluc Sensor (FREESTYLE LIBRE 14 DAY SENSOR) MISC APPLY ONE SENSOR TO THE BACK OF YOUR UPPER ARM. REPLACE EVERY 14 DAYS. 09/03/20   Horald Pollen, MD  Dulaglutide (TRULICITY) 1.5 SN/0.5LZ SOPN Inject 1.5 mg into the skin once a week. 11/07/20   Just, Laurita Quint, FNP  gabapentin (NEURONTIN) 100 MG capsule TAKE ONE CAPSULE BY MOUTH THREE TIMES A DAY 05/04/20   Maximiano Coss, NP  insulin glargine, 2 Unit Dial, (TOUJEO MAX SOLOSTAR) 300 UNIT/ML Solostar Pen INJECT 55 UNITS UNDER THE SKIN EVERY MORNING Patient taking differently: INJECT [place] SKIN EVERY MORNING Patient injects 42 units 07/27/20   Just, Laurita Quint, FNP  Insulin Pen Needle (ULTICARE MICRO PEN NEEDLES) 32G X 4 MM MISC USE WITH INJECTIONS ONE TIME DAILY 11/07/20   Just, Laurita Quint, FNP  isosorbide mononitrate (IMDUR) 120 MG 24 hr tablet Take 1 tablet (120 mg total) by mouth daily. 04/24/20   Hilty, Nadean Corwin, MD  lisinopril (ZESTRIL) 5 MG tablet Take 1 tablet (5 mg total) by mouth daily. 11/07/20   Just, Laurita Quint, FNP  loratadine (CLARITIN) 10 MG tablet Take 1 tablet (10 mg total) by mouth daily. 05/01/17   Tenna Delaine D, PA-C  Magnesium Hydroxide (MAGNESIA PO) Take by mouth.    [provider]  methocarbamol (ROBAXIN) 500 MG tablet Take 1 tablet (500 mg total) by mouth 2 (two) times daily as needed. 11/07/20   Just, Laurita Quint, FNP  metoprolol tartrate (LOPRESSOR) 50 MG tablet TAKE ONE TABLET BY MOUTH TWICE A DAY 08/10/20    Hilty, Nadean Corwin, MD  Multiple Vitamin (MULTIVITAMIN WITH MINERALS) TABS tablet Take 1 tablet by mouth daily.    [provider]  nitroGLYCERIN (NITROSTAT) 0.4 MG SL tablet Place 1 tablet (0.4 mg total) under the tongue every 5 (five) minutes as needed for chest pain. 01/30/20 04/29/20  HiltyNadean Corwin, MD  Omega-3 Fatty Acids (FISH OIL PO) Take by mouth.    [provider]  ranolazine (RANEXA) 500 MG 12 hr tablet Take 500 mg by mouth 2 (two) times daily.    [provider]  rosuvastatin (CRESTOR) 40 MG tablet Take 1 tablet (40 mg total) by mouth daily. 11/07/20   Just, Laurita Quint, FNP  sertraline (ZOLOFT) 100 MG tablet TAKE TWO TABLETS BY MOUTH ONE TIME DAILY 08/02/20   Just, Laurita Quint, FNP  TURMERIC PO Take 1 capsule by mouth daily.    [provider]  varenicline (CHANTIX) 0.5 MG tablet Take 1 tablet (0.5 mg total) by mouth 2 (two) times daily. When 1 week left, contact office for continuing dose pack 10/22/20   Hilty, Nadean Corwin, MD    Allergies Januvia [sitagliptin], Metformin and related, and Penicillins   REVIEW OF SYSTEMS  Negative except as noted here or in the History of Present Illness.   PHYSICAL EXAMINATION  Initial Vital Signs Blood pressure (!) 157/71, pulse 73, temperature 98.2 F (36.8 C), temperature source Oral, resp. rate 18, height 5' 6"  (1.676 m), weight 90.8 kg, SpO2 95 %.  Examination General: Well-developed, well-nourished female in no acute distress; appearance consistent with age of record HENT: normocephalic; atraumatic Eyes: pupils equal, round and reactive to light; extraocular muscles intact Neck: supple Heart: regular rate and rhythm Lungs: clear to auscultation bilaterally Abdomen: soft; nondistended; epigastric and right upper quadrant tenderness; bowel sounds hypoactive Extremities: No deformity; full range of motion; pulses normal Neurologic: Awake, alert and oriented; motor function intact in all extremities and  symmetric; no facial droop Skin: Warm and dry Psychiatric: Normal mood and affect   RESULTS  Summary of this visit's results, reviewed and interpreted by myself:   EKG Interpretation  Date/Time:  Monday November 26 2020 22:37:27 EST Ventricular Rate:  72 PR Interval:    QRS Duration: 95 QT Interval:  397 QTC Calculation: 435 R Axis:   17 Text Interpretation: Sinus rhythm Probable left atrial enlargement Anteroseptal infarct, age indeterminate No significant change was found Confirmed by Shanon Rosser [phone]) on 11/26/2020 10:39:54 PM      Laboratory Studies: Results for orders placed or performed during the hospital encounter of 11/26/20 (from the past 24 hour(s))  Lipase, blood     Status: None   Collection Time: 11/26/20 11:10 PM  Result Value Ref Range   Lipase 36 11 - 51 U/L  Comprehensive metabolic panel     Status: Abnormal   Collection Time: 11/26/20 11:10 PM  Result Value Ref Range   Sodium 135 135 - 145 mmol/L   Potassium 3.7 3.5 - 5.1 mmol/L   Chloride 101 98 - 111 mmol/L   CO2 23 22 - 32 mmol/L   Glucose, Bld 146 (H) 70 - 99 mg/dL   BUN 24 (H) 8 - 23 mg/dL   Creatinine, Ser 1.10 (H) 0.44 - 1.00 mg/dL   Calcium 9.3 8.9 - 10.3 mg/dL   Total Protein 7.6 6.5 - 8.1 g/dL   Albumin 3.7 3.5 - 5.0 g/dL   AST 19 15 - 41 U/L   ALT 21 0 - 44 U/L   Alkaline Phosphatase 63 38 - 126 U/L   Total Bilirubin 0.4 0.3 - 1.2 mg/dL   GFR, Estimated 57 (L) >60 mL/min   Anion gap 11 5 - 15  Troponin I (High Sensitivity)     Status: Abnormal   Collection Time: 11/26/20 11:10 PM  Result Value Ref Range   Troponin I (High Sensitivity) 25 (H) <18 ng/L  CBC with Differential/Platelet     Status: Abnormal   Collection Time: 11/26/20 11:10 PM  Result Value Ref Range   WBC 8.9 4.0 - 10.5 K/uL   RBC 4.76 3.87 - 5.11 MIL/uL   Hemoglobin 11.4 (L) 12.0 - 15.0 g/dL   HCT 36.5 36.0 - 46.0 %  MCV 76.7 (L) 80.0 - 100.0 fL   MCH 23.9 (L) 26.0 - 34.0 pg   MCHC 31.2 30.0 - 36.0 g/dL   RDW 18.6  (H) 11.5 - 15.5 %   Platelets 183 150 - 400 K/uL   nRBC 0.0 0.0 - 0.2 %   Neutrophils Relative % 70 %   Neutro Abs 6.2 1.7 - 7.7 K/uL   Lymphocytes Relative 17 %   Lymphs Abs 1.5 0.7 - 4.0 K/uL   Monocytes Relative 8 %   Monocytes Absolute 0.7 0.1 - 1.0 K/uL   Eosinophils Relative 4 %   Eosinophils Absolute 0.4 0.0 - 0.5 K/uL   Basophils Relative 0 %   Basophils Absolute 0.0 0.0 - 0.1 K/uL   Immature Granulocytes 1 %   Abs Immature Granulocytes 0.05 0.00 - 0.07 K/uL  Troponin I (High Sensitivity)     Status: None   Collection Time: 11/27/20 12:53 AM  Result Value Ref Range   Troponin I (High Sensitivity) 16 <18 ng/L  Urinalysis, Routine w reflex microscopic     Status: Abnormal   Collection Time: 11/27/20  2:23 AM  Result Value Ref Range   Color, Urine YELLOW YELLOW   APPearance CLEAR CLEAR   Specific Gravity, Urine 1.025 1.005 - 1.030   pH 6.0 5.0 - 8.0   Glucose, UA >=500 (A) NEGATIVE mg/dL   Hgb urine dipstick NEGATIVE NEGATIVE   Bilirubin Urine NEGATIVE NEGATIVE   Ketones, ur NEGATIVE NEGATIVE mg/dL   Protein, ur NEGATIVE NEGATIVE mg/dL   Nitrite NEGATIVE NEGATIVE   Leukocytes,Ua NEGATIVE NEGATIVE  Urinalysis, Microscopic (reflex)     Status: Abnormal   Collection Time: 11/27/20  2:23 AM  Result Value Ref Range   RBC / HPF 0-5 0 - 5 RBC/hpf   WBC, UA 0-5 0 - 5 WBC/hpf   Bacteria, UA RARE (A) NONE SEEN   Squamous Epithelial / LPF 0-5 0 - 5   Imaging Studies: US Abdomen Limited RUQ (LIVER/GB)  Result Date: 11/27/2020 CLINICAL DATA:  Right upper quadrant pain EXAM: ULTRASOUND ABDOMEN LIMITED RIGHT UPPER QUADRANT COMPARISON:  Ultrasound 12/17/2017, CT a chest 12/12/2018 FINDINGS: Gallbladder: Gallbladder contains multiple echogenic shadowing stones and sludge, largest gallstone measuring up to 3.3 cm in maximal diameter. Gallbladder wall thickness is upper limits normal a 2.5 mm. No pericholecystic fluid. Sonographic Percell Miller sign is reportedly negative. Common bile duct:  Diameter: 4 mm, nondilated. Liver: No focal lesion identified. Within normal limits in parenchymal echogenicity. No intrahepatic biliary ductal dilatation. Portal vein is patent on color Doppler imaging with normal direction of blood flow towards the liver. Other: Right kidney measures 12.5 cm in length. Normal renal cortical echogenicity. Mild right hydronephrosis. No shadowing calculus or renal mass. IMPRESSION: 1. Cholelithiasis and sludge within the gallbladder. No sonographic evidence of acute cholecystitis. 2. Mild right hydronephrosis. Electronically Signed   By: Lovena Le M.D.   On: 11/27/2020 01:10    ED COURSE and MDM  Nursing notes, initial and subsequent vitals signs, including pulse oximetry, reviewed and interpreted by myself.  Vitals:   11/27/20 0100 11/27/20 0130 11/27/20 0200 11/27/20 0230  BP: (!) 169/71 (!) 163/70 (!) 180/71 (!) 180/65  Pulse: 68 76 75 73  Resp: (!) 22 (!) 23 19 (!) 25  Temp:      TempSrc:      SpO2: 95% 95% 92% 97%  Weight:      Height:       Medications  ondansetron (ZOFRAN) injection 4 mg (4 mg  Intravenous Given 11/26/20 2313)  fentaNYL (SUBLIMAZE) injection 100 mcg (100 mcg Intravenous Given 11/26/20 2313)  pantoprazole (PROTONIX) injection 40 mg (40 mg Intravenous Given 11/27/20 0000)  fentaNYL (SUBLIMAZE) injection 100 mcg (100 mcg Intravenous Given 11/27/20 0225)   12:06 AM Patient's troponin is mildly elevated at 25.  It is unclear if this represents an acute event or chronic elevation due to her known heart disease.  She is more tender in the right upper quadrant in the epigastrium and has a history of cholelithiasis.  We will obtain an ultrasound.  2:10 AM Repeat troponin is within normal limits.  The first troponin may have been a false positive.  The location of her pain is more consistent with gallbladder related pain and she does have gallstones seen on her ultrasound.  She continues to have pain in her right upper quadrant and she continues to  be tender in her right upper quadrant.  Given the mild hydronephrosis seen on ultrasound we are awaiting a urine to evaluate for hematuria suggesting a stone.  3:00 AM No hematuria to suggest kidney stone in the pattern of the patient's pain is not consistent with renal colic.  I suspect her pain is due to biliary colic.  We will treat her symptomatically and refer to [facility] for definitive treatment.  PROCEDURES  Procedures   ED DIAGNOSES     ICD-10-CM   1. Biliary colic  A25.67   2. RUQ abdominal pain  R10.11 US Abdomen Limited RUQ (LIVER/GB)    US Abdomen Limited RUQ (LIVER/GB)       Samreet Edenfield, MD 11/27/20 0300

## 2020-11-26 NOTE — ED Triage Notes (Signed)
Upper abdominal pain. Hx CABG 2019 with same type of pain.

## 2020-11-27 ENCOUNTER — Emergency Department (HOSPITAL_BASED_OUTPATIENT_CLINIC_OR_DEPARTMENT_OTHER): Payer: 59

## 2020-11-27 LAB — URINALYSIS, MICROSCOPIC (REFLEX)

## 2020-11-27 LAB — URINALYSIS, ROUTINE W REFLEX MICROSCOPIC
Bilirubin Urine: NEGATIVE
Glucose, UA: >=500 mg/dL — AB
Hgb urine dipstick: NEGATIVE
Ketones, ur: NEGATIVE mg/dL
Leukocytes,Ua: NEGATIVE
Nitrite: NEGATIVE
Protein, ur: NEGATIVE mg/dL
Specific Gravity, Urine: 1.025 (ref 1.005–1.030)
pH: 6 (ref 5.0–8.0)

## 2020-11-27 LAB — TROPONIN I (HIGH SENSITIVITY): Troponin I (High Sensitivity): 16 ng/L (ref <18)

## 2020-11-27 MED ORDER — ONDANSETRON 8 MG PO TBDP: 8.0000 mg | ORAL_TABLET | Freq: Three times a day (TID) | ORAL | 1 refills | Status: DC | PRN

## 2020-11-27 MED ORDER — SODIUM CHLORIDE 0.9 % IV BOLUS: 1000.0000 mL | Freq: Once | INTRAVENOUS | Status: DC

## 2020-11-27 MED ORDER — OXYCODONE-ACETAMINOPHEN 10-325 MG PO TABS: 1.0000 | ORAL_TABLET | Freq: Four times a day (QID) | ORAL | 0 refills | Status: DC | PRN

## 2020-11-27 MED ORDER — FENTANYL CITRATE (PF) 100 MCG/2ML IJ SOLN
100.0000 ug | Freq: Once | INTRAMUSCULAR | Status: AC
  Administered 2020-11-27: 100 ug via INTRAVENOUS
  Filled 2020-11-27: qty 2

## 2020-11-27 NOTE — ED Notes (Signed)
ED Provider at bedside. 

## 2020-11-27 NOTE — ED Notes (Signed)
Patient transported to Ultrasound 

## 2020-12-05 ENCOUNTER — Ambulatory Visit (INDEPENDENT_AMBULATORY_CARE_PROVIDER_SITE_OTHER): Payer: 59 | Admitting: Psychology

## 2020-12-05 DIAGNOSIS — F419 Anxiety disorder, unspecified: Secondary | ICD-10-CM | POA: Diagnosis not present

## 2020-12-05 DIAGNOSIS — F329 Major depressive disorder, single episode, unspecified: Secondary | ICD-10-CM

## 2020-12-06 ENCOUNTER — Encounter: Payer: Self-pay | Admitting: Family Medicine

## 2020-12-06 ENCOUNTER — Other Ambulatory Visit: Payer: Self-pay | Admitting: Family Medicine

## 2020-12-06 DIAGNOSIS — Z794 Long term (current) use of insulin: Secondary | ICD-10-CM

## 2020-12-06 DIAGNOSIS — E118 Type 2 diabetes mellitus with unspecified complications: Secondary | ICD-10-CM

## 2020-12-06 MED ORDER — DAPAGLIFLOZIN PROPANEDIOL 5 MG PO TABS: 5.0000 mg | ORAL_TABLET | Freq: Every day | ORAL | 3 refills | Status: DC

## 2020-12-07 ENCOUNTER — Telehealth: Payer: Self-pay | Admitting: Internal Medicine

## 2020-12-07 NOTE — Telephone Encounter (Signed)
Fax received from [facility] that patient needs PA for invokana. Called [facility], explained this is not active med on her list, prescribers name on fax is Huston Foley Just on [address] but this is marked out and Dr. Debara Pickett is hand-written in. Explained that we did not prescribe this medication and pharmacy staff said they will contact patient.   Of note, there is a message to patient from K. Just FNP on 12/06/20 explaining med was denied, changed to [place].

## 2020-12-13 ENCOUNTER — Other Ambulatory Visit: Payer: Self-pay | Admitting: Family Medicine

## 2020-12-13 ENCOUNTER — Other Ambulatory Visit: Payer: Self-pay | Admitting: Internal Medicine

## 2020-12-13 NOTE — Telephone Encounter (Signed)
Patient called to say she knows that Dr. Debara Pickett didn't prescribe Invokana mediation but she asking that he prescribe it to her. Because the original dr/facility no longer exist.   *STAT* If patient is at the pharmacy, call can be transferred to refill team.   1. Which medications need to be refilled? (please list name of each medication and dose if known) varenicline (CHANTIX) 0.5 MG tablet  2. Which pharmacy/location (including street and city if local pharmacy) is medication to be sent to? [facility] [address] [place], [place]. AT [address]  3. Do they need a 30 day or 90 day supply? [place]

## 2020-12-13 NOTE — Telephone Encounter (Signed)
Thanks . Looks like she is now on [place] - sounds like a good option  Dr Lemmie Evens

## 2020-12-13 NOTE — Telephone Encounter (Signed)
Message has been sent to Dr. Debara Pickett to review  Copied from chart from last week: Just, Laurita Quint, FNP to [place], [place]   [facility]   12/06/20 2:38 PM Hello Arbie Cookey,  Your invokana is not covered by your insurance. I am switching you to [place] daily at the request of your insurance company. You will take this once daily. I have sent this medication to your pharmacy. Let me know if you have any questions or concerns.  Mendel Ryder Just, FNP

## 2020-12-14 NOTE — Telephone Encounter (Signed)
Spoke with pt, aware refill sent to the pharmacy. 

## 2020-12-14 NOTE — Telephone Encounter (Signed)
Pt is returning call.  

## 2020-12-14 NOTE — Telephone Encounter (Addendum)
Left message to call back  Generic chantix refilled by MD yesterday

## 2020-12-17 ENCOUNTER — Other Ambulatory Visit: Payer: Self-pay | Admitting: Registered Nurse

## 2020-12-17 DIAGNOSIS — M79601 Pain in right arm: Secondary | ICD-10-CM

## 2020-12-18 ENCOUNTER — Other Ambulatory Visit: Payer: Self-pay | Admitting: Family Medicine

## 2020-12-18 DIAGNOSIS — M79601 Pain in right arm: Secondary | ICD-10-CM

## 2020-12-25 ENCOUNTER — Ambulatory Visit (INDEPENDENT_AMBULATORY_CARE_PROVIDER_SITE_OTHER): Payer: 59 | Admitting: Psychology

## 2020-12-25 DIAGNOSIS — F329 Major depressive disorder, single episode, unspecified: Secondary | ICD-10-CM

## 2020-12-25 DIAGNOSIS — F419 Anxiety disorder, unspecified: Secondary | ICD-10-CM | POA: Diagnosis not present

## 2020-12-27 NOTE — Progress Notes (Signed)
[facility] New Patient Evaluation and Consultation  Referring Provider: No ref. provider found PCP: Patient, No Pcp Per (Inactive) Date of Service: 12/31/2020  SUBJECTIVE Chief Complaint: New Patient (Initial Visit) - prolapse and incontinence.   History of Present Illness: Victoria Eaton is a 60 y.o. White or Caucasian female presenting for evaluation of prolapse.     Urinary Symptoms: Leaks urine with cough/ sneeze, laughing, exercise, lifting, with a full bladder, with movement to the bathroom, with urgency and while asleep. Has more urgency rather than SUI. Last week, lost total control of her bladder.  Leaks 10+ time(s) per day.  Pad use: 2 pads per day.   She is bothered by her UI symptoms.  Day time voids 8.  Nocturia: 1-2 times per night to void. Voiding dysfunction: she empties her bladder well.  does not use a catheter to empty bladder.  When urinating, she feels a weak stream, difficulty starting urine stream, dribbling after finishing and to push on her belly or vagina to empty bladder Drinks: 2-3 cups coffee in AM, 20-30oz carbonated water, 8oz diet coke, 3-4 glasses wine per day Has already cut back on coffee and coke.   UTIs: 0 UTI's in the last year.   Reports history of pyelonephritis  Pelvic Organ Prolapse Symptoms:                  She Admits to a feeling of a bulge the vaginal area. It has been present for 6- 7 years.  She Admits to seeing a bulge.  This bulge is bothersome. Seen in [facility] a few years ago. Tried a pessary- used a gellhorn and is worked ok, but it is not something that she wants long term.   Bowel Symptom: Bowel movements: 4 time(s) per week Stool consistency: soft  or loose Straining: yes.  Splinting: no.  Incomplete evacuation: no.  She Denies accidental bowel leakage / fecal incontinence Bowel regimen: none  Sexual Function Sexually active: no.  Sexual orientation: heterosexual Pain with sex: Yes, deep in the  pelvis, has discomfort due to prolapse, has discomfort due to dryness  Pelvic Pain Denies pelvic pain   Past Medical History:  Past Medical History:  Diagnosis Date  . Anxiety   . Arthritis    "maybe in my left foot" (10/14/2017)  . Bladder prolapse, female, acquired 09/14/2017  . Depression   . High cholesterol   . History of stomach ulcers   . Hypertension   . Myocardial infarction ([place]) 12/30/2004  . Tobacco abuse   . Type II diabetes mellitus ([place])      Past Surgical History:   Past Surgical History:  Procedure Laterality Date  . CARDIAC CATHETERIZATION  10/14/2017  . CORONARY ANGIOPLASTY WITH STENT PLACEMENT  12/30/2004   Patient reported  . CORONARY ARTERY BYPASS GRAFT N/A 10/19/2017   Procedure: CORONARY ARTERY BYPASS GRAFTING (CABG) times four using the right saphaneous vein. Harvested endoscopicly and left internal mammary artery.;  Surgeon: Grace Isaac, MD;  Location: [facility];  Service: Open Heart Surgery;  Laterality: N/A;  . DILATION AND CURETTAGE OF UTERUS  1980s  . LEFT HEART CATH AND CORONARY ANGIOGRAPHY N/A 10/14/2017   Procedure: LEFT HEART CATH AND CORONARY ANGIOGRAPHY;  Surgeon: Martinique, Peter M, MD;  Location: [facility];  Service: Cardiovascular;  Laterality: N/A;  . LEFT HEART CATH AND CORS/GRAFTS ANGIOGRAPHY N/A 01/01/2018   Procedure: LEFT HEART CATH AND CORS/GRAFTS ANGIOGRAPHY;  Surgeon: Jettie Booze, MD;  Location:  [facility];  Service: Cardiovascular;  Laterality: N/A;  . LEFT HEART CATH AND CORS/GRAFTS ANGIOGRAPHY N/A 08/16/2018   Procedure: LEFT HEART CATH AND CORS/GRAFTS ANGIOGRAPHY;  Surgeon: Nelva Bush, MD;  Location: [facility];  Service: Cardiovascular;  Laterality: N/A;  . PILONIDAL CYST EXCISION  1980s  . TEE WITHOUT CARDIOVERSION N/A 10/19/2017   Procedure: TRANSESOPHAGEAL ECHOCARDIOGRAM (TEE);  Surgeon: Grace Isaac, MD;  Location: [facility];  Service: Open Heart Surgery;  Laterality: N/A;      Past OB/GYN History: G3 P1 Vaginal deliveries: 1,  Forceps/ Vacuum deliveries: 0, Cesarean section: 0 Menopausal: Yes, Denies vaginal bleeding since menopause Last pap smear was 2020- negative.  Any history of abnormal pap smears: no.   Medications: She has a current medication list which includes the following prescription(s): amlodipine, aspirin, bupropion, cholestyramine, clopidogrel, freestyle libre 14 day sensor, dapagliflozin propanediol, trulicity, gabapentin, toujeo max solostar, ulticare micro pen needles, isosorbide mononitrate, lisinopril, loratadine, magnesium hydroxide, methocarbamol, metoprolol tartrate, multivitamin with minerals, omega-3 fatty acids, ondansetron, oxycodone-acetaminophen, rosuvastatin, sertraline, varenicline, nitroglycerin, ranolazine, and turmeric.   Allergies: Patient is allergic to [place] [sitagliptin], metformin and related, and penicillins.   Social History:  Social History   Tobacco Use  . Smoking status: Current Some Day Smoker    Packs/day: 0.12    Years: 45.00    Pack years: 5.40    Types: Cigarettes    Start date: 05/01/1972    Last attempt to quit: 09/30/2017    Years since quitting: 3.2  . Smokeless tobacco: Never Used  Vaping Use  . Vaping Use: Never used  Substance Use Topics  . Alcohol use: Yes    Alcohol/week: 20.0 standard drinks    Types: 20 Glasses of wine per week    Comment: 4 glasses red wine at night  . Drug use: No    Relationship status: widowed She lives alone and sometimes daughter stays with her.   She is not employed. Regular exercise: No, but does some yard work outside.   Family History:   Family History  Problem Relation Age of Onset  . Heart disease Father   . Hyperlipidemia Father   . Hypertension Father      Review of Systems: Review of Systems  Constitutional: Positive for malaise/fatigue. Negative for fever and weight loss.  Respiratory: Negative for cough, shortness of breath and wheezing.    Cardiovascular: Negative for chest pain, palpitations and leg swelling.  Gastrointestinal: Negative for abdominal pain and blood in stool.  Genitourinary: Negative for dysuria.  Musculoskeletal: Negative for myalgias.  Skin: Negative for rash.  Neurological: Positive for headaches. Negative for dizziness.  Endo/Heme/Allergies: Bruises/bleeds easily.  Psychiatric/Behavioral: Positive for depression. The patient is nervous/anxious.      OBJECTIVE Physical Exam: Vitals:   12/31/20 1319  BP: 108/63  Pulse: 69  Weight: 197 lb (89.4 kg)  Height: 5' 6"  (1.676 m)    Physical Exam Constitutional:      General: She is not in acute distress. Pulmonary:     Effort: Pulmonary effort is normal.  Abdominal:     General: There is no distension.     Palpations: Abdomen is soft.     Tenderness: There is no abdominal tenderness. There is no rebound.  Musculoskeletal:        General: No swelling. Normal range of motion.  Skin:    General: Skin is warm and dry.     Findings: No rash.  Neurological:     Mental Status: She  is alert and oriented to person, place, and time.  Psychiatric:        Mood and Affect: Mood normal.        Behavior: Behavior normal.     GU / Detailed Urogynecologic Evaluation:  Pelvic Exam: Normal external female genitalia; Bartholin's and Skene's glands normal in appearance; urethral meatus normal in appearance, no urethral masses or discharge.   CST: negative  Complete procedentia noted.   Speculum exam reveals normal vaginal mucosa with atrophy. Cervix normal appearance. Uterus normal single, nontender. Adnexa no mass, fullness, tenderness.    Pelvic floor strength II/V  Pelvic floor musculature: Right levator non-tender, Right obturator non-tender, Left levator non-tender, Left obturator non-tender  POP-Q:   POP-Q  3                                            Aa   7                                           Ba  8                                               C   6                                            Gh  2.5                                            Pb  8                                            tvl   3                                            Ap  5                                            Bp  -4                                              D     Rectal Exam:  Normal external rectum  Post-Void Residual (PVR) by Bladder Scan: In order to evaluate bladder emptying, we discussed obtaining a postvoid residual and she agreed to this procedure. She as unable to provide a urine sample.   Procedure: The ultrasound unit was placed on the patient's abdomen  in the suprapubic region after the patient had voided. A PVR of 63 ml was obtained by bladder scan.  Laboratory Results: Unable to provide urine sample  ASSESSMENT AND PLAN Ms. Muzio is a 60 y.o. with:  1. Uterovaginal prolapse, complete   2. Urinary frequency   3. Prolapse of anterior vaginal wall   4. Prolapse of posterior vaginal wall   5. SUI (stress urinary incontinence, female)   6. Overactive bladder     1. Stage IV anterior, Stage IV posterior, Stage IV apical prolapse For treatment of pelvic organ prolapse, we discussed options for management including expectant management, conservative management, and surgical management, such as Kegels, a pessary, pelvic floor physical therapy, and specific surgical procedures. - She is interested in surgery.We discussed two options for prolapse repair:   1) vaginal repair without mesh  - Pros - safer, no mesh complications  - Cons - not as strong as mesh repair, higher risk of recurrence   2) laparoscopic repair with mesh  - Pros - stronger, better long-term success  - Cons - risks of mesh implant (erosion into vagina or bladder, adhering to the rectum, pain) - these risks are lower than with a vaginal mesh but still exist - She is not interested in a procedure with mesh. We discussed that with her history  of smoking and DM that there are potentially increased risks with poor healing from surgery. Therefore would recommend total vaginal hysterectomy, uterosacral suspension, anterior and posterior repair.  - Until she can have the surgery, she would like her pessary replaced (or a new pessary). Will have her return for pessary fitting after urodynamics.   2. For treatment of stress urinary incontinence,  non-surgical options include expectant management, weight loss, physical therapy, as well as a pessary.  Surgical options include a midurethral sling, Burch urethropexy, and transurethral injection of a bulking agent. - She is interested in a concomitant procedure for incontinence. Will have her undergo urodynamic testing since she has mixed symptoms. She is potentially either interested in a sling or urethral bulking.   3. OAB We discussed the symptoms of overactive bladder (OAB), which include urinary urgency, urinary frequency, nocturia, with or without urge incontinence.  While we do not know the exact etiology of OAB, several treatment options exist. We discussed management including behavioral therapy (decreasing bladder irritants, urge suppression strategies, timed voids, bladder retraining), physical therapy, medication.  - She will first start with drinking more water and reducing bladder irritants- list provided.   Return for urodynamic testing and pessary fitting in follow up visit after testing.   [name], MD   Time spent: I spent 55 minutes dedicated to the care of this patient on the date of this encounter to include pre-visit review of records, face-to-face time with the patient and post visit documentation.

## 2020-12-31 ENCOUNTER — Other Ambulatory Visit: Payer: Self-pay

## 2020-12-31 ENCOUNTER — Encounter: Payer: Self-pay | Admitting: Obstetrics and Gynecology

## 2020-12-31 ENCOUNTER — Ambulatory Visit (INDEPENDENT_AMBULATORY_CARE_PROVIDER_SITE_OTHER): Payer: 59 | Admitting: Obstetrics and Gynecology

## 2020-12-31 VITALS — BP 108/63 | HR 69 | Ht 66.0 in | Wt 197.0 lb

## 2020-12-31 DIAGNOSIS — N813 Complete uterovaginal prolapse: Secondary | ICD-10-CM

## 2020-12-31 DIAGNOSIS — N393 Stress incontinence (female) (male): Secondary | ICD-10-CM | POA: Diagnosis not present

## 2020-12-31 DIAGNOSIS — N3281 Overactive bladder: Secondary | ICD-10-CM

## 2020-12-31 DIAGNOSIS — R350 Frequency of micturition: Secondary | ICD-10-CM

## 2020-12-31 DIAGNOSIS — N816 Rectocele: Secondary | ICD-10-CM

## 2020-12-31 DIAGNOSIS — N8110 Cystocele, unspecified: Secondary | ICD-10-CM | POA: Diagnosis not present

## 2020-12-31 NOTE — Patient Instructions (Addendum)
Today we talked about ways to manage bladder urgency such as altering your diet to avoid irritative beverages and foods (bladder diet) as well as attempting to decrease stress and other exacerbating factors.    The Most Bothersome Foods* The Least Bothersome Foods*  Coffee - Regular & Decaf Tea - caffeinated Carbonated beverages - cola, non-colas, diet & caffeine-free Alcohols - Beer, Red Wine, White Wine, Champagne Fruits - Grapefruit, [place], Orange, [employer] - Cranberry, Grapefruit, Orange, Pineapple Vegetables - Tomato & Tomato Products Flavor Enhancers - Hot peppers, Spicy foods, Chili, Horseradish, Vinegar, Monosodium glutamate (MSG) Artificial Sweeteners - NutraSweet, Sweet 'N Low, Equal (sweetener), Saccharin Ethnic foods - [place], [place], [place] food Express Scripts - low-fat & whole Fruits - Bananas, Blueberries, Honeydew melon, Pears, Raisins, Watermelon Vegetables - Broccoli, Brussels Sprouts, [place], Carrots, Cauliflower, [place], Cucumber, Mushrooms, Peas, Radishes, Squash, Zucchini, White potatoes, Sweet potatoes & yams Poultry - Chicken, Eggs, [place], [employer] - Beef, Programmer, multimedia, Lamb Seafood - Shrimp, [place] fish, Salmon Grains - Oat, Rice Snacks - Pretzels, Popcorn  *Lissa Morales et al. Diet and its role in interstitial cystitis/bladder pain syndrome (IC/BPS) and comorbid conditions. [place] 2012 Jan 11.    URODYNAMICS (UDS) TEST INFORMATION  IMPORTANT: Please try to arrive with a comfortably full bladder!  **Complete a 3 day bladder diary prior to your appointment.  What is UDS? [name] Urodynamics is a bladder test used to evaluate how your bladder and urethra (tube you urinate out of) work to help find out the cause of your bladder symptoms and evaluate your bladder function in order to make the best treatment plan for you.   What to expect? . A nurse will perform the test and will be with you during the entire exam. . First we will have to empty your  bladder on a special toilet.  After you have emptied your bladder, very small catheters (plastic tubing) will be placed into your bladder and into your vagina (or rectum). These special small catheters measure pressure to help measure your bladder function.  Your bladder will be gently filled with water and you will be asked to cough and strain at several different points during the test.   . You will then be asked to empty your bladder in the special toilet with the catheters in place. Most patients can urinate (pee) easily with the catheters in place since the catheters are so small. . In total this procedure lasts about 45 minutes to 1 hour.  . After your test is completed, you will return (or possibly be seen the same day) to review the results, talk about treatment options and make a plan moving forward.

## 2021-01-17 ENCOUNTER — Ambulatory Visit: Payer: 59 | Admitting: Psychology

## 2021-01-18 ENCOUNTER — Ambulatory Visit (INDEPENDENT_AMBULATORY_CARE_PROVIDER_SITE_OTHER): Payer: 59 | Admitting: Psychology

## 2021-01-18 DIAGNOSIS — F419 Anxiety disorder, unspecified: Secondary | ICD-10-CM

## 2021-01-18 DIAGNOSIS — F329 Major depressive disorder, single episode, unspecified: Secondary | ICD-10-CM | POA: Diagnosis not present

## 2021-01-28 ENCOUNTER — Encounter: Payer: 59 | Admitting: Obstetrics and Gynecology

## 2021-02-12 ENCOUNTER — Telehealth: Payer: 59 | Admitting: Physician Assistant

## 2021-02-12 ENCOUNTER — Encounter: Payer: Self-pay | Admitting: Physician Assistant

## 2021-02-12 DIAGNOSIS — U071 COVID-19: Secondary | ICD-10-CM

## 2021-02-12 MED ORDER — MOLNUPIRAVIR EUA 200MG CAPSULE: 4.0000 | ORAL_CAPSULE | Freq: Two times a day (BID) | ORAL | 0 refills | Status: DC

## 2021-02-12 MED ORDER — FLUTICASONE PROPIONATE 50 MCG/ACT NA SUSP: 2.0000 | Freq: Every day | NASAL | 0 refills | Status: DC

## 2021-02-12 MED ORDER — MOLNUPIRAVIR EUA 200MG CAPSULE: 4.0000 | ORAL_CAPSULE | Freq: Two times a day (BID) | ORAL | 0 refills | Status: AC

## 2021-02-12 MED ORDER — BENZONATATE 100 MG PO CAPS: 100.0000 mg | ORAL_CAPSULE | Freq: Three times a day (TID) | ORAL | 0 refills | Status: DC | PRN

## 2021-02-12 NOTE — Addendum Note (Signed)
Addended by: [name] on: 02/12/2021 05:31 PM   Modules accepted: Orders

## 2021-02-12 NOTE — Patient Instructions (Signed)
1. COVID-19  - e-visit completed by myself earlier today with symptomatic therapy given  - vide visit for discussion of antiviral therapy.  Review of patient's allergies, medical history and medications.  She is taking numerous medications that will interfere with Paxlovid.  Molnupiravir prescribed.  - Patient with current moderate symptoms - discussion with her about the potential for worsening to severe symptoms.    - Patient will obtain pulse ox from pharmacy to monitor oxygen saturations.  Will go to the emergency department if oxygen saturations are less than 90%, she has difficulty breathing or has any additional dizzy episodes.

## 2021-02-12 NOTE — Progress Notes (Signed)
E-Visit for Positive Covid Test Result We are sorry you are not feeling well. We are here to help!  You have tested positive for COVID-19, meaning that you were infected with the novel coronavirus and could give the virus to others.  It is vitally important that you stay home so you do not spread it to others.      You likely qualify for antiviral therapy, but due to the complex nature of the medication will need to have a PCP or video visit.  These medications must be started within 5 days of your symptom onset (i.e. by Wednesday).  We will otherwise treat your symptoms with the prescriptions listed below.  Please continue isolation at home, for at least 10 days since the start of your symptoms and until you have had 24 hours with no fever (without taking a fever reducer) and with improving of symptoms.  If you have no symptoms but tested positive (or all symptoms resolve after 5 days and you have no fever) you can leave your house but continue to wear a mask around others for an additional 5 days. If you have a fever,continue to stay home until you have had 24 hours of no fever. Most cases improve 5-10 days from onset but we have seen a small number of patients who have gotten worse after the 10 days.  Please be sure to watch for worsening symptoms and remain taking the proper precautions.   Go to the nearest hospital ED for assessment if fever/cough/breathlessness are severe or illness seems like a threat to life.    The following symptoms may appear 2-14 days after exposure: . Fever . Cough . Shortness of breath or difficulty breathing . Chills . Repeated shaking with chills . Muscle pain . Headache . Sore throat . New loss of taste or smell . Fatigue . Congestion or runny nose . Nausea or vomiting . Diarrhea  You have been enrolled in [facility] for COVID-19. Daily you will receive a questionnaire within the [facility] website. Our COVID-19 response team will be monitoring  your responses daily.  You can use medication such as prescription cough medication called Tessalon Perles 100 mg. You may take 1-2 capsules every 8 hours as needed for cough and prescription for Fluticasone nasal spray 2 sprays in each nostril one time per day Continue the imodium for diarrhea  You may also take acetaminophen (Tylenol) as needed for fever.  HOME CARE: . Only take medications as instructed by your medical team. . Drink plenty of fluids and get plenty of rest. . A steam or ultrasonic humidifier can help if you have congestion.   GET HELP RIGHT AWAY IF YOU HAVE EMERGENCY WARNING SIGNS.  Call 911 or proceed to your closest emergency facility if: . You develop worsening high fever. . Trouble breathing . Bluish lips or face . Persistent pain or pressure in the chest . New confusion . Inability to wake or stay awake . You cough up blood. . Your symptoms become more severe . Inability to hold down food or fluids  This list is not all possible symptoms. Contact your medical provider for any symptoms that are severe or concerning to you.    Your e-visit answers were reviewed by a board certified advanced clinical practitioner to complete your personal care plan.  Depending on the condition, your plan could have included both over the counter or prescription medications.  If there is a problem please reply once you have received a  response from your provider.  Your safety is important to [place].  If you have drug allergies check your prescription carefully.    You can use MyChart to ask questions about today's visit, request a non-urgent call back, or ask for a work or school excuse for 24 hours related to this e-Visit. If it has been greater than 24 hours you will need to follow up with your provider, or enter a new e-Visit to address those concerns. You will get an e-mail in the next two days asking about your experience.  I hope that your e-visit has been valuable and will speed  your recovery. Thank you for using e-visits.     Greater than 5 minutes, yet less than 10 minutes of time have been spent researching, coordinating, and implementing care for this patient today

## 2021-02-12 NOTE — Progress Notes (Signed)
Victoria Eaton, Victoria Eaton are scheduled for a virtual visit with your provider today.    Just as we do with appointments in the office, we must obtain your consent to participate.  Your consent will be active for this visit and any virtual visit you may have with one of our providers in the next 365 days.    If you have a MyChart account, I can also send a copy of this consent to you electronically.  All virtual visits are billed to your insurance company just like a traditional visit in the office.  As this is a virtual visit, video technology does not allow for your provider to perform a traditional examination.  This may limit your provider's ability to fully assess your condition.  If your provider identifies any concerns that need to be evaluated in person or the need to arrange testing such as labs, EKG, etc, we will make arrangements to do so.    Although advances in technology are sophisticated, we cannot ensure that it will always work on either your end or our end.  If the connection with a video visit is poor, we may have to switch to a telephone visit.  With either a video or telephone visit, we are not always able to ensure that we have a secure connection.   I need to obtain your verbal consent now.   Are you willing to proceed with your visit today?   Victoria Eaton has provided verbal consent on 02/12/2021 for a virtual visit (video or telephone).   Victoria Butts, PA-C 02/12/2021  4:14 PM   Date:  02/12/2021   ID:  Victoria Eaton, DOB 1960-10-27, MRN [id number]  Patient Location: Home Provider Location: Home Office   Participants: Patient and Provider for Visit and Wrap up  Method of visit: Video  Location of Patient: Home Location of Provider: Home Office Consent was obtain for visit over the video. Services rendered by provider: Visit was performed via video  A video enabled telemedicine application was used and I verified that I am speaking with the correct person using two  identifiers.  PCP:  Patient, No Pcp Per (Inactive)   Chief Complaint:  COVID +  History of Present Illness:    Victoria Eaton is a 60 y.o. female with history as stated below. Presents video telehealth for an acute care visit  Pt reports she was around her daughter who recently tested + for COVID - 5 days ago. Pt reports she started feeling ill on Saturday (4 days ago) and the symptoms have worsened. She has tested every day since then and today had a positive rapid home test.  Pt c/o fever, body aches, headache, diarrhea, sore throat, cough, tired.   BP taken during visit by patient:  4:18 PM 137/68 - pt reports this is slightly higher than usual, but not by much.  Pt reports she had one episode of lightheadedness when standing, but this has not returned.  She does not feel that she is ill enough to need to go to the emergency room.  She does not have a pulse ox, but will get one.  No SOB or DOE.   The patient does have symptoms concerning for COVID-19 infection (fever, chills, cough, or new shortness of breath).  Patient has been tested for COVID during this illness - positive today.  Pt is vaccinated and boosted.   CABG 2019, cardiac stent 2006.  Past Medical, Surgical, Social History, Allergies, and Medications have been Reviewed.  Patient Active  Problem List   Diagnosis Date Noted  . Non-ST elevation (NSTEMI) myocardial infarction ([place]) 02/17/2018  . Type 2 diabetes mellitus with complication, with long-term current use of insulin ([place]) 02/17/2018  . Chest pain 01/01/2018  . Unstable angina ([place]) 01/01/2018  . Arm contusion, left, initial encounter 11/25/2017  . Contusion of right knee 11/25/2017  . Hx of CABG 10/19/2017  . Epigastric pain   . Pancreatitis 10/10/2017  . Hyperlipidemia LDL goal <70 09/14/2017  . Essential hypertension 05/01/2017  . Anxiety and depression 05/01/2017  . Tobacco abuse 05/01/2017  . History of MI (myocardial infarction) 05/01/2017  .  Allergic rhinitis 05/01/2017  . History of arthritis 05/01/2017  . Bladder prolapse, female, acquired 11/09/2013    Social History   Tobacco Use  . Smoking status: Current Some Day Smoker    Packs/day: 0.12    Years: 45.00    Pack years: 5.40    Types: Cigarettes    Start date: 05/01/1972    Last attempt to quit: 09/30/2017    Years since quitting: 3.3  . Smokeless tobacco: Never Used  Substance Use Topics  . Alcohol use: Yes    Alcohol/week: 20.0 standard drinks    Types: 20 Glasses of wine per week    Comment: 4 glasses red wine at night     Current Outpatient Medications:  .  molnupiravir EUA 200 mg CAPS, Take 4 capsules (800 mg total) by mouth 2 (two) times daily for 5 days., Disp: 40 capsule, Rfl: 0 .  amLODipine (NORVASC) 5 MG tablet, TAKE ONE TABLET BY MOUTH ONE TIME DAILY, Disp: 90 tablet, Rfl: 3 .  aspirin 81 MG EC tablet, Take 1 tablet (81 mg total) by mouth daily., Disp: 90 tablet, Rfl: 3 .  benzonatate (TESSALON PERLES) 100 MG capsule, Take 1 capsule (100 mg total) by mouth 3 (three) times daily as needed for cough (cough)., Disp: 20 capsule, Rfl: 0 .  clopidogrel (PLAVIX) 75 MG tablet, Take 1 tablet (75 mg total) by mouth daily., Disp: 90 tablet, Rfl: 2 .  Continuous Blood Gluc Sensor (FREESTYLE LIBRE 14 DAY SENSOR) MISC, APPLY ONE SENSOR TO THE BACK OF YOUR UPPER ARM. REPLACE EVERY 14 DAYS., Disp: 62 each, Rfl: 5 .  dapagliflozin propanediol (FARXIGA) 5 MG TABS tablet, Take 1 tablet (5 mg total) by mouth daily before breakfast., Disp: 90 tablet, Rfl: 3 .  Dulaglutide (TRULICITY) 1.5 GM/0.1UU SOPN, Inject 1.5 mg into the skin once a week., Disp: 0.5 mL, Rfl: 6 .  fluticasone (FLONASE) 50 MCG/ACT nasal spray, Place 2 sprays into both nostrils daily., Disp: 9.9 g, Rfl: 0 .  gabapentin (NEURONTIN) 100 MG capsule, TAKE ONE CAPSULE BY MOUTH THREE TIMES A DAY, Disp: 90 capsule, Rfl: 3 .  insulin glargine, 2 Unit Dial, (TOUJEO MAX SOLOSTAR) 300 UNIT/ML Solostar Pen, INJECT 55  UNITS UNDER THE SKIN EVERY MORNING (Patient taking differently: INJECT 55 UNITS UNDER THE SKIN EVERY MORNING Patient injects 42 units), Disp: 18 mL, Rfl: 2 .  Insulin Pen Needle (ULTICARE MICRO PEN NEEDLES) 32G X 4 MM MISC, USE WITH INJECTIONS ONE TIME DAILY, Disp: 400 each, Rfl: 0 .  isosorbide mononitrate (IMDUR) 120 MG 24 hr tablet, Take 1 tablet (120 mg total) by mouth daily., Disp: 90 tablet, Rfl: 3 .  lisinopril (ZESTRIL) 5 MG tablet, Take 1 tablet (5 mg total) by mouth daily., Disp: 90 tablet, Rfl: 1 .  loratadine (CLARITIN) 10 MG tablet, Take 1 tablet (10 mg total) by mouth daily., Disp: 90  tablet, Rfl: 3 .  Magnesium Hydroxide (MAGNESIA PO), Take by mouth., Disp: , Rfl:  .  methocarbamol (ROBAXIN) 500 MG tablet, Take 1 tablet (500 mg total) by mouth 2 (two) times daily as needed., Disp: 20 tablet, Rfl: 0 .  metoprolol tartrate (LOPRESSOR) 50 MG tablet, TAKE ONE TABLET BY MOUTH TWICE A DAY, Disp: 30 tablet, Rfl: 6 .  Multiple Vitamin (MULTIVITAMIN WITH MINERALS) TABS tablet, Take 1 tablet by mouth daily., Disp: , Rfl:  .  nitroGLYCERIN (NITROSTAT) 0.4 MG SL tablet, Place 1 tablet (0.4 mg total) under the tongue every 5 (five) minutes as needed for chest pain., Disp: 25 tablet, Rfl: 3 .  Omega-3 Fatty Acids (FISH OIL PO), Take by mouth., Disp: , Rfl:  .  ondansetron (ZOFRAN ODT) 8 MG disintegrating tablet, Take 1 tablet (8 mg total) by mouth every 8 (eight) hours as needed for nausea or vomiting., Disp: 10 tablet, Rfl: 1 .  oxyCODONE-acetaminophen (PERCOCET) 10-325 MG tablet, Take 1 tablet by mouth every 6 (six) hours as needed for pain., Disp: 20 tablet, Rfl: 0 .  ranolazine (RANEXA) 500 MG 12 hr tablet, Take 500 mg by mouth 2 (two) times daily. (Patient not taking: Reported on 12/31/2020), Disp: , Rfl:  .  rosuvastatin (CRESTOR) 40 MG tablet, Take 1 tablet (40 mg total) by mouth daily., Disp: 90 tablet, Rfl: 3 .  sertraline (ZOLOFT) 100 MG tablet, TAKE TWO TABLETS BY MOUTH ONE TIME DAILY,  Disp: 180 tablet, Rfl: 0 .  TURMERIC PO, Take 1 capsule by mouth daily. (Patient not taking: Reported on 12/31/2020), Disp: , Rfl:  .  varenicline (CHANTIX) 1 MG tablet, Take 1 tablet (1 mg total) by mouth 2 (two) times daily., Disp: 60 tablet, Rfl: 2   Allergies  Allergen Reactions  . Januvia [Sitagliptin] Nausea And Vomiting and Other (See Comments)    Pancreatitis (this was in Epic, but patient was unaware of this)   . Metformin And Related Diarrhea  . Penicillins Other (See Comments)    Unknown childhood reaction Has patient had a PCN reaction causing immediate rash, facial/tongue/throat swelling, SOB or lightheadedness with hypotension: Unknown Has patient had a PCN reaction causing severe rash involving mucus membranes or skin necrosis: Unknown Has patient had a PCN reaction that required hospitalization: Unknown Has patient had a PCN reaction occurring within the last 10 years: No If all of the above answers are "NO", then may proceed with Cephalosporin use.      Review of Systems  Constitutional: Positive for chills, fever and malaise/fatigue.  HENT: Positive for congestion and sore throat. Negative for ear pain.   Eyes: Negative for blurred vision and double vision.  Respiratory: Positive for cough. Negative for shortness of breath and wheezing.   Cardiovascular: Negative for chest pain, palpitations and leg swelling.  Gastrointestinal: Positive for diarrhea. Negative for abdominal pain, nausea and vomiting.  Genitourinary: Negative for dysuria.  Musculoskeletal: Positive for myalgias.  Skin: Negative for rash.  Neurological: Positive for headaches. Negative for loss of consciousness and weakness.  Psychiatric/Behavioral: The patient is not nervous/anxious.    See HPI for history of present illness.  Physical Exam Constitutional:      General: She is not in acute distress.    Appearance: Normal appearance. She is not ill-appearing.  HENT:     Head: Normocephalic and  atraumatic.     Comments: No visible cyanosis, nasal flaring or pursed lip breathing    Nose: No congestion.  Eyes:     Extraocular  Movements: Extraocular movements intact.  Pulmonary:     Effort: Pulmonary effort is normal.     Comments: Speaks in full sentences No accessory muscle usage Musculoskeletal:        General: Normal range of motion.     Cervical back: Normal range of motion.  Skin:    Coloration: Skin is not pale.  Neurological:     General: No focal deficit present.     Mental Status: She is alert. Mental status is at baseline.  Psychiatric:        Mood and Affect: Mood normal.               A&P  1. COVID-19  - e-visit completed by myself earlier today with symptomatic therapy given  - vide visit for discussion of antiviral therapy.  Review of patient's allergies, medical history and medications.  She is taking numerous medications that will interfere with Paxlovid.  Molnupiravir prescribed.  - Patient with current moderate symptoms - discussion with her about the potential for worsening to severe symptoms.    - Patient will obtain pulse ox from pharmacy to monitor oxygen saturations.  Will go to the emergency department if oxygen saturations are less than 90%, she has difficulty breathing or has any additional dizzy episodes.   Patient voiced understanding and agreement to plan.   Time:   Today, I have spent 15 minutes with the patient with telehealth technology discussing the above problems, reviewing the chart, previous notes, medications and orders.    Tests Ordered: No orders of the defined types were placed in this encounter.   Medication Changes: Meds ordered this encounter  Medications  . molnupiravir EUA 200 mg CAPS    Sig: Take 4 capsules (800 mg total) by mouth 2 (two) times daily for 5 days.    Dispense:  40 capsule    Refill:  0     Disposition:  Follow up ER for worsening symptoms  Signed, Victoria Butts, PA-C  02/12/2021 4:48 PM

## 2021-02-13 ENCOUNTER — Telehealth: Payer: Self-pay | Admitting: *Deleted

## 2021-02-13 NOTE — Telephone Encounter (Signed)
BPA triggered for worsening weakness. Attempted to reach pt, left VM issue will be addressed via questionnaire. Advised to call if wishes any further discussion, questions. Number provided.

## 2021-02-19 ENCOUNTER — Telehealth: Payer: Self-pay

## 2021-02-19 NOTE — Telephone Encounter (Signed)
Pt.'s questionnaire triggered BPA. Left message for pt. To call back and discuss.

## 2021-02-19 NOTE — Telephone Encounter (Signed)
Patient returned call- she reported diarrhea on COVID MyChart- companion.  Patient states she has frequent diarrhea due to her stomach ulcer and she treats that with Imodium. Patient states she has been watching her hydration during this time. Her biggest complaint at this time is fatigue/weakness.  Patient advised to fatigue is common symptom with COVID: Rest as needed, good diet/fliuds, get proper sleep, anxiety and routine changes can certainly play role in how patient feels- hopefully improvement over time. Please continue to monitor and call provider with any changes.

## 2021-02-28 ENCOUNTER — Telehealth: Payer: Self-pay | Admitting: Obstetrics and Gynecology

## 2021-02-28 ENCOUNTER — Telehealth: Payer: Self-pay

## 2021-02-28 NOTE — Telephone Encounter (Signed)
This patient is requesting to establish care with Dr. Martinique. Her Daughter Naketa Daddario sees Dr. Martinique as well.  Can I schedule her for a new patient appointment?

## 2021-03-01 NOTE — Telephone Encounter (Signed)
Okay for patient to be scheduled?

## 2021-03-01 NOTE — Telephone Encounter (Signed)
Patient scheduled.

## 2021-03-01 NOTE — Telephone Encounter (Signed)
DOB verified. Pt called stating that she is having some concerns regarding her prolapse. She states that when she saw Dr. Wannetta Sender, they discussed putting her pessary back in, but she decided not to do that at that time. She states that now she is experiencing pain and is having some spotting. She is unsure where the bleeding is coming from, she just notices it at random times. She is feeling like maybe the pessary should be put back in. Advised that she should be evaluated by a provider. Appt given.

## 2021-03-01 NOTE — Telephone Encounter (Signed)
Okay to schedule

## 2021-03-07 ENCOUNTER — Ambulatory Visit (INDEPENDENT_AMBULATORY_CARE_PROVIDER_SITE_OTHER): Payer: 59 | Admitting: Psychology

## 2021-03-07 DIAGNOSIS — F419 Anxiety disorder, unspecified: Secondary | ICD-10-CM

## 2021-03-07 DIAGNOSIS — F329 Major depressive disorder, single episode, unspecified: Secondary | ICD-10-CM

## 2021-03-08 ENCOUNTER — Ambulatory Visit (INDEPENDENT_AMBULATORY_CARE_PROVIDER_SITE_OTHER): Payer: 59 | Admitting: Obstetrics & Gynecology

## 2021-03-08 ENCOUNTER — Other Ambulatory Visit: Payer: Self-pay

## 2021-03-08 ENCOUNTER — Encounter (HOSPITAL_BASED_OUTPATIENT_CLINIC_OR_DEPARTMENT_OTHER): Payer: Self-pay | Admitting: Obstetrics & Gynecology

## 2021-03-08 ENCOUNTER — Other Ambulatory Visit: Payer: Self-pay | Admitting: Registered Nurse

## 2021-03-08 VITALS — BP 121/61 | HR 75 | Ht 66.0 in | Wt 197.2 lb

## 2021-03-08 DIAGNOSIS — M79601 Pain in right arm: Secondary | ICD-10-CM

## 2021-03-08 DIAGNOSIS — M545 Low back pain, unspecified: Secondary | ICD-10-CM

## 2021-03-08 DIAGNOSIS — G8929 Other chronic pain: Secondary | ICD-10-CM

## 2021-03-08 DIAGNOSIS — N813 Complete uterovaginal prolapse: Secondary | ICD-10-CM | POA: Diagnosis not present

## 2021-03-08 DIAGNOSIS — R102 Pelvic and perineal pain: Secondary | ICD-10-CM

## 2021-03-09 DIAGNOSIS — N813 Complete uterovaginal prolapse: Secondary | ICD-10-CM | POA: Insufficient documentation

## 2021-03-09 NOTE — Progress Notes (Signed)
GYNECOLOGY  VISIT  CC:   complete prolapse  HPI: 60 y.o. [id number] Widowed White or Caucasian female here for complaint of pelvic and back pressure with her complete uterine prolapse.  Pt has been seen by Dr. Rodman Key at [facility] and had Gelhorn pessary placed.  Pt's gynecologist at [facility] (pt cannot recall name) had to remove it.  She has seen Dr. Wannetta Sender and surgery is in the future.  Needs urodynamic testing done and will be done with provider is back [facility].  Pt would like to see if can have pessary placed again.  She does not always feel that she is emptying her bladder completely and also has pelvic and back pain that she feels is due to the prolapse.  She does also not occasional spotting.  Reviewed both Dr. Tommas Olp and Dr. West Pugh notes   Patient Active Problem List   Diagnosis Date Noted   Non-ST elevation (NSTEMI) myocardial infarction ([place]) 02/17/2018   Type 2 diabetes mellitus with complication, with long-term current use of insulin ([place]) 02/17/2018   Chest pain 01/01/2018   Unstable angina ([place]) 01/01/2018   Arm contusion, left, initial encounter 11/25/2017   Contusion of right knee 11/25/2017   Hx of CABG 10/19/2017   Epigastric pain    Pancreatitis 10/10/2017   Hyperlipidemia LDL goal <70 09/14/2017   Essential hypertension 05/01/2017   Anxiety and depression 05/01/2017   Tobacco abuse 05/01/2017   History of MI (myocardial infarction) 05/01/2017   Allergic rhinitis 05/01/2017   History of arthritis 05/01/2017   Bladder prolapse, female, acquired 11/09/2013    Past Medical History:  Diagnosis Date   Anxiety    Arthritis    "maybe in my left foot" (10/14/2017)   Bladder prolapse, female, acquired 09/14/2017   Depression    High cholesterol    History of stomach ulcers    Hypertension    Myocardial infarction ([place]) 12/30/2004   Tobacco abuse    Type II diabetes mellitus ([place])     Past Surgical History:  Procedure Laterality Date    CARDIAC CATHETERIZATION  10/14/2017   CORONARY ANGIOPLASTY WITH STENT PLACEMENT  12/30/2004   Patient reported   CORONARY ARTERY BYPASS GRAFT N/A 10/19/2017   Procedure: CORONARY ARTERY BYPASS GRAFTING (CABG) times four using the right saphaneous vein. Harvested endoscopicly and left internal mammary artery.;  Surgeon: Grace Isaac, MD;  Location: [facility];  Service: Open Heart Surgery;  Laterality: N/A;   DILATION AND CURETTAGE OF UTERUS  1980s   LEFT HEART CATH AND CORONARY ANGIOGRAPHY N/A 10/14/2017   Procedure: LEFT HEART CATH AND CORONARY ANGIOGRAPHY;  Surgeon: Martinique, Peter M, MD;  Location: [facility];  Service: Cardiovascular;  Laterality: N/A;   LEFT HEART CATH AND CORS/GRAFTS ANGIOGRAPHY N/A 01/01/2018   Procedure: LEFT HEART CATH AND CORS/GRAFTS ANGIOGRAPHY;  Surgeon: Jettie Booze, MD;  Location: [facility] [facility];  Service: Cardiovascular;  Laterality: N/A;   LEFT HEART CATH AND CORS/GRAFTS ANGIOGRAPHY N/A 08/16/2018   Procedure: LEFT HEART CATH AND CORS/GRAFTS ANGIOGRAPHY;  Surgeon: Nelva Bush, MD;  Location: [place];  Service: Cardiovascular;  Laterality: N/A;   PILONIDAL CYST EXCISION  1980s   TEE WITHOUT CARDIOVERSION N/A 10/19/2017   Procedure: TRANSESOPHAGEAL ECHOCARDIOGRAM (TEE);  Surgeon: Grace Isaac, MD;  Location: [facility];  Service: Open Heart Surgery;  Laterality: N/A;    MEDS:   Current Outpatient Medications on File Prior to Visit  Medication Sig Dispense Refill   amLODipine (  NORVASC) 5 MG tablet TAKE ONE TABLET BY MOUTH ONE TIME DAILY 90 tablet 3   aspirin 81 MG EC tablet Take 1 tablet (81 mg total) by mouth daily. 90 tablet 3   clopidogrel (PLAVIX) 75 MG tablet Take 1 tablet (75 mg total) by mouth daily. 90 tablet 2   Continuous Blood Gluc Sensor (FREESTYLE LIBRE 14 DAY SENSOR) MISC APPLY ONE SENSOR TO THE BACK OF YOUR UPPER ARM. REPLACE EVERY 14 DAYS. 62 each 5   dapagliflozin propanediol (FARXIGA) 5 MG TABS tablet Take 1  tablet (5 mg total) by mouth daily before breakfast. 90 tablet 3   Dulaglutide (TRULICITY) 1.5 JH/4.1DE SOPN Inject 1.5 mg into the skin once a week. 0.5 mL 6   gabapentin (NEURONTIN) 100 MG capsule TAKE ONE CAPSULE BY MOUTH THREE TIMES A DAY 90 capsule 3   insulin glargine, 2 Unit Dial, (TOUJEO MAX SOLOSTAR) 300 UNIT/ML Solostar Pen INJECT 55 UNITS UNDER THE SKIN EVERY MORNING (Patient taking differently: INJECT 55 UNITS UNDER THE SKIN EVERY MORNING Patient injects 42 units) 18 mL 2   Insulin Pen Needle (ULTICARE MICRO PEN NEEDLES) 32G X 4 MM MISC USE WITH INJECTIONS ONE TIME DAILY 400 each 0   isosorbide mononitrate (IMDUR) 120 MG 24 hr tablet Take 1 tablet (120 mg total) by mouth daily. 90 tablet 3   lisinopril (ZESTRIL) 5 MG tablet Take 1 tablet (5 mg total) by mouth daily. 90 tablet 1   loratadine (CLARITIN) 10 MG tablet Take 1 tablet (10 mg total) by mouth daily. 90 tablet 3   Magnesium Hydroxide (MAGNESIA PO) Take by mouth.     methocarbamol (ROBAXIN) 500 MG tablet Take 1 tablet (500 mg total) by mouth 2 (two) times daily as needed. 20 tablet 0   metoprolol tartrate (LOPRESSOR) 50 MG tablet TAKE ONE TABLET BY MOUTH TWICE A DAY 30 tablet 6   Multiple Vitamin (MULTIVITAMIN WITH MINERALS) TABS tablet Take 1 tablet by mouth daily.     Omega-3 Fatty Acids (FISH OIL PO) Take by mouth.     ondansetron (ZOFRAN ODT) 8 MG disintegrating tablet Take 1 tablet (8 mg total) by mouth every 8 (eight) hours as needed for nausea or vomiting. 10 tablet 1   oxyCODONE-acetaminophen (PERCOCET) 10-325 MG tablet Take 1 tablet by mouth every 6 (six) hours as needed for pain. 20 tablet 0   ranolazine (RANEXA) 500 MG 12 hr tablet Take 500 mg by mouth 2 (two) times daily.     rosuvastatin (CRESTOR) 40 MG tablet Take 1 tablet (40 mg total) by mouth daily. 90 tablet 3   sertraline (ZOLOFT) 100 MG tablet TAKE TWO TABLETS BY MOUTH ONE TIME DAILY 180 tablet 0   varenicline (CHANTIX) 1 MG tablet Take 1 tablet (1 mg total)  by mouth 2 (two) times daily. 60 tablet 2   benzonatate (TESSALON PERLES) 100 MG capsule Take 1 capsule (100 mg total) by mouth 3 (three) times daily as needed for cough (cough). (Patient not taking: Reported on 03/08/2021) 20 capsule 0   fluticasone (FLONASE) 50 MCG/ACT nasal spray Place 2 sprays into both nostrils daily. (Patient not taking: Reported on 03/08/2021) 9.9 g 0   nitroGLYCERIN (NITROSTAT) 0.4 MG SL tablet Place 1 tablet (0.4 mg total) under the tongue every 5 (five) minutes as needed for chest pain. 25 tablet 3   TURMERIC PO Take 1 capsule by mouth daily. (Patient not taking: No sig reported)     No current facility-administered medications on file prior to visit.  ALLERGIES: Januvia [sitagliptin], Metformin and related, and Penicillins  Family History  Problem Relation Age of Onset   Heart disease Father    Hyperlipidemia Father    Hypertension Father     SH:  married, non smoker  Review of Systems  All other systems reviewed and are negative.  PHYSICAL EXAMINATION:    BP 121/61 (BP Location: Right Arm, Patient Position: Sitting, Cuff Size: Large)   Pulse 75   Ht 5' 6"  (1.676 m) Comment: reported  Wt 197 lb 3.2 oz (89.4 kg)   BMI 31.83 kg/m     General appearance: alert, cooperative and appears stated age Abdomen: soft, non-tender; bowel sounds normal; no masses,  no organomegaly Lymph:  no inguinal LAD noted  Pelvic: External genitalia:  no lesions              Urethra:  normal appearing urethra with no masses, tenderness or lesions              Bartholins and Skenes: normal                 Vagina: normal appearing vagina but with complete central uterine prolapse.  Cervix is passed introitus.                 Cervix: no lesions and tissue is thickened c/w pt complete prolapse              Bimanual Exam:  Uterus:  normal size, contour, position, consistency, mobility, non-tender              Adnexa: no mass, fullness, tenderness  Pessary fitting:  #5  incontinence ring placed.  This was comfortable for pt but there was some bladder prolapse around the front of the pessary.  #6 incontinence ring with support placed.  Much better fit.  Pt would remove pessary.  Pt aware this will need to be ordered.  Chaperone, Octaviano Batty, CMA, was present for exam.  Assessment/Plan: There are no diagnoses linked to this encounter.

## 2021-03-12 ENCOUNTER — Ambulatory Visit (INDEPENDENT_AMBULATORY_CARE_PROVIDER_SITE_OTHER): Payer: 59 | Admitting: Family Medicine

## 2021-03-12 ENCOUNTER — Other Ambulatory Visit: Payer: Self-pay

## 2021-03-12 ENCOUNTER — Encounter: Payer: Self-pay | Admitting: Family Medicine

## 2021-03-12 VITALS — BP 122/76 | HR 71 | Temp 98.5°F | Resp 16 | Ht 66.0 in | Wt 199.0 lb

## 2021-03-12 DIAGNOSIS — F3341 Major depressive disorder, recurrent, in partial remission: Secondary | ICD-10-CM

## 2021-03-12 DIAGNOSIS — Z794 Long term (current) use of insulin: Secondary | ICD-10-CM

## 2021-03-12 DIAGNOSIS — E785 Hyperlipidemia, unspecified: Secondary | ICD-10-CM | POA: Diagnosis not present

## 2021-03-12 DIAGNOSIS — M545 Low back pain, unspecified: Secondary | ICD-10-CM | POA: Diagnosis not present

## 2021-03-12 DIAGNOSIS — G8929 Other chronic pain: Secondary | ICD-10-CM

## 2021-03-12 DIAGNOSIS — E118 Type 2 diabetes mellitus with unspecified complications: Secondary | ICD-10-CM

## 2021-03-12 DIAGNOSIS — F419 Anxiety disorder, unspecified: Secondary | ICD-10-CM

## 2021-03-12 DIAGNOSIS — I10 Essential (primary) hypertension: Secondary | ICD-10-CM

## 2021-03-12 DIAGNOSIS — F32A Depression, unspecified: Secondary | ICD-10-CM

## 2021-03-12 LAB — POCT GLYCOSYLATED HEMOGLOBIN (HGB A1C): Hemoglobin A1C: 7.6 % — AB (ref 4.0–5.6)

## 2021-03-12 NOTE — Patient Instructions (Addendum)
A few things to remember from today's visit:   Hyperlipidemia LDL goal <70  Type 2 diabetes mellitus with complication, with long-term current use of insulin ([place]), Chronic - Plan: POC HgB A1c  Essential hypertension  Anxiety and depression  Chronic bilateral low back pain, unspecified whether sciatica present  If you need refills please call your pharmacy. Do not use My Chart to request refills or for acute issues that need immediate attention.   We can arrange appt with pain management or you can request your pain medication from your orthopedist. Toujeo increased from 33 to 77. No changes in rest of medications.   Please be sure medication list is accurate. If a new problem present, please set up appointment sooner than planned today.

## 2021-03-12 NOTE — Progress Notes (Signed)
HPI: Victoria Eaton is a 60 y.o. female, who is here today to establish care.  Former PCP: Lyda Kalata, FNP Primary care at [facility]. Last preventive routine visit: 09/06/19. She has a gyn, Dr Wannetta Sender.  Chronic medical problems: DM II,CAD,HTN, anxiety,sensorineural hearing loss,and OA among some.   Concerns today: Follow up on some chronic medical problems. DM II: HgA1C 7.7 in 07/2020. BS's "fairly high", 170-190's.  She is on Toujeo 44, she is titrating it up every 3 rd day according to BS's. She is also on Trulicity 1.5 mg weekly and Farxiga 5 mg daily. Negative for polydipsia,polyuria, or polyphagia.  Lab Results  Component Value Date   LABMICR 18.3 05/23/2019   LABMICR 9.9 05/01/2017   HTN: She is on Metoprolol tartrate 50 mg bid,Lisinopril 5 mg daily,Amlodipine 5 mg daily,and Imdur 120 mg daily.  CAD, she follows with cardiologist. She is on Plavix 75 mg daily and Aspirin 81 mg daily. Negative for severe/frequent headache, visual changes, chest pain, dyspnea, palpitation, focal weakness, or edema.  Component     Latest Ref Rng & Units 11/26/2020          Sodium     135 - 145 mEq/L 135  Potassium     3.5 - 5.1 mEq/L 3.7  Chloride     96 - 112 mEq/L 101  CO2     19 - 32 mEq/L 23  Glucose     70 - 99 mg/dL 146 (H)  BUN     6 - 23 mg/dL 24 (H)  Creatinine     0.40 - 1.20 mg/dL 1.10 (H)  Calcium     8.4 - 10.5 mg/dL 9.3   HLD: She is on Crestor 40 mg daily.  Component     Latest Ref Rng & Units 03/01/2020  Cholesterol, Total     100 - 199 mg/dL 183  Triglycerides     0.0 - 149.0 mg/dL 134  HDL Cholesterol     >39.00 mg/dL 74  VLDL Cholesterol Cal     5 - 40 mg/dL 23  LDL Chol Calc (NIH)     0 - 99 mg/dL 86  Total CHOL/HDL Ratio      2.5   Depression for a few years. She is on Sertraline 100 mg daily and Wellbutrin XL 600 mg daily. She has been under a lot of stress for the past couple years. Her PCP was managing her depression and  anxiety.  Sleeps about 5-6 hours, wakes up frequently. She takes naps during the day.  Chronic lower back pain, she is on Methocarbamol 500 mg bid pr. She has also been on Tramadol 50 mg and Percocet 10-325 mg, last prescribed in 03/2020 and 11/2020 respectively.  She follows with ortho, Dr Erlinda Hong Carpal tunnel synd, planning on surgical treatment on 03/21/21.  Skin paresthesias upper extremities, she has seen Dr Ernestina Patches. Takes Gabapentin 100 mg tid. Evaluated in the ED on 11/26/20 because abdominal pain. Referred to surgeon for biliary colic, she is not considering surgery for now. Chronic diarrhea, she has taken Questran. Follows with GI.  Review of Systems  Constitutional:  Positive for fatigue. Negative for activity change, appetite change and fever.  HENT:  Negative for mouth sores, nosebleeds and sore throat.   Respiratory:  Negative for cough and wheezing.   Gastrointestinal:  Negative for abdominal pain, nausea and vomiting.       Negative for changes in bowel habits.  Genitourinary:  Negative for decreased urine volume  and hematuria.  Musculoskeletal:  Positive for arthralgias and back pain. Negative for gait problem.  Skin:  Negative for rash and wound.  Neurological:  Negative for syncope, facial asymmetry and weakness.  Psychiatric/Behavioral:  Negative for confusion. The patient is nervous/anxious.   Rest see pertinent positives and negatives per HPI.  Current Outpatient Medications on File Prior to Visit  Medication Sig Dispense Refill   amLODipine (NORVASC) 5 MG tablet TAKE ONE TABLET BY MOUTH ONE TIME DAILY 90 tablet 3   aspirin 81 MG EC tablet Take 1 tablet (81 mg total) by mouth daily. 90 tablet 3   buPROPion (WELLBUTRIN XL) 300 MG 24 hr tablet Take 600 mg by mouth daily.     clopidogrel (PLAVIX) 75 MG tablet Take 1 tablet (75 mg total) by mouth daily. 90 tablet 2   Continuous Blood Gluc Sensor (FREESTYLE LIBRE 14 DAY SENSOR) MISC APPLY ONE SENSOR TO THE BACK OF YOUR UPPER  ARM. REPLACE EVERY 14 DAYS. 62 each 5   dapagliflozin propanediol (FARXIGA) 5 MG TABS tablet Take 1 tablet (5 mg total) by mouth daily before breakfast. 90 tablet 3   Dulaglutide (TRULICITY) 1.5 YB/0.1BP SOPN Inject 1.5 mg into the skin once a week. 0.5 mL 6   gabapentin (NEURONTIN) 100 MG capsule TAKE ONE CAPSULE BY MOUTH THREE TIMES A DAY 90 capsule 3   insulin glargine, 2 Unit Dial, (TOUJEO MAX SOLOSTAR) 300 UNIT/ML Solostar Pen INJECT 55 UNITS UNDER THE SKIN EVERY MORNING (Patient taking differently: INJECT 55 UNITS UNDER THE SKIN EVERY MORNING Patient injects 42 units) 18 mL 2   Insulin Pen Needle (ULTICARE MICRO PEN NEEDLES) 32G X 4 MM MISC USE WITH INJECTIONS ONE TIME DAILY 400 each 0   isosorbide mononitrate (IMDUR) 120 MG 24 hr tablet Take 1 tablet (120 mg total) by mouth daily. 90 tablet 3   lisinopril (ZESTRIL) 5 MG tablet Take 1 tablet (5 mg total) by mouth daily. 90 tablet 1   Magnesium Hydroxide (MAGNESIA PO) Take by mouth.     methocarbamol (ROBAXIN) 500 MG tablet Take 1 tablet (500 mg total) by mouth 2 (two) times daily as needed. 20 tablet 0   metoprolol tartrate (LOPRESSOR) 50 MG tablet TAKE ONE TABLET BY MOUTH TWICE A DAY 30 tablet 6   Multiple Vitamin (MULTIVITAMIN WITH MINERALS) TABS tablet Take 1 tablet by mouth daily.     Omega-3 Fatty Acids (FISH OIL PO) Take by mouth.     rosuvastatin (CRESTOR) 40 MG tablet Take 1 tablet (40 mg total) by mouth daily. 90 tablet 3   sertraline (ZOLOFT) 100 MG tablet TAKE TWO TABLETS BY MOUTH ONE TIME DAILY 180 tablet 0   varenicline (CHANTIX) 1 MG tablet Take 1 tablet (1 mg total) by mouth 2 (two) times daily. 60 tablet 2   nitroGLYCERIN (NITROSTAT) 0.4 MG SL tablet Place 1 tablet (0.4 mg total) under the tongue every 5 (five) minutes as needed for chest pain. 25 tablet 3   No current facility-administered medications on file prior to visit.   Past Medical History:  Diagnosis Date   Anxiety    Arthritis    "maybe in my left foot"  (10/14/2017)   Bladder prolapse, female, acquired 09/14/2017   Depression    High cholesterol    History of stomach ulcers    Hypertension    Myocardial infarction ([place]) 12/30/2004   Tobacco abuse    Type II diabetes mellitus (HCC)    Allergies  Allergen Reactions   Januvia [Sitagliptin]  Nausea And Vomiting and Other (See Comments)    Pancreatitis (this was in Epic, but patient was unaware of this)    Metformin And Related Diarrhea   Penicillins Other (See Comments)    Unknown childhood reaction Has patient had a PCN reaction causing immediate rash, facial/tongue/throat swelling, SOB or lightheadedness with hypotension: Unknown Has patient had a PCN reaction causing severe rash involving mucus membranes or skin necrosis: Unknown Has patient had a PCN reaction that required hospitalization: Unknown Has patient had a PCN reaction occurring within the last 10 years: No If all of the above answers are "NO", then may proceed with Cephalosporin use.     Family History  Problem Relation Age of Onset   Heart disease Father    Hyperlipidemia Father    Hypertension Father     Social History   Socioeconomic History   Marital status: Widowed    Spouse name: Glendell Docker   Number of children: 1   Years of education: Not on file   Highest education level: Not on file  Occupational History   Not on file  Tobacco Use   Smoking status: Some Days    Packs/day: 0.12    Years: 45.00    Pack years: 5.40    Types: Cigarettes    Start date: 05/01/1972    Last attempt to quit: 09/30/2017    Years since quitting: 3.4   Smokeless tobacco: Never  Vaping Use   Vaping Use: Never used  Substance and Sexual Activity   Alcohol use: Yes    Alcohol/week: 20.0 standard drinks    Types: 20 Glasses of wine per week    Comment: 4 glasses red wine at night   Drug use: No   Sexual activity: Not Currently  Other Topics Concern   Not on file  Social History Narrative   Not on file   Social Determinants  of Health   Financial Resource Strain: Not on file  Food Insecurity: Not on file  Transportation Needs: Not on file  Physical Activity: Not on file  Stress: Not on file  Social Connections: Not on file   Vitals:   03/12/21 1112  BP: 122/76  Pulse: 71  Resp: 16  Temp: 98.5 F (36.9 C)  SpO2: 94%   Body mass index is 32.12 kg/m.  Physical Exam Vitals and nursing note reviewed.  Constitutional:      General: She is not in acute distress.    Appearance: She is well-developed.  HENT:     Head: Normocephalic and atraumatic.     Mouth/Throat:     Mouth: Mucous membranes are moist.     Pharynx: Oropharynx is clear.  Eyes:     Conjunctiva/sclera: Conjunctivae normal.  Cardiovascular:     Rate and Rhythm: Normal rate and regular rhythm.     Pulses:          Dorsalis pedis pulses are 2+ on the right side and 2+ on the left side.     Heart sounds: No murmur heard. Pulmonary:     Effort: Pulmonary effort is normal. No respiratory distress.     Breath sounds: Normal breath sounds.  Abdominal:     Palpations: Abdomen is soft. There is no hepatomegaly or mass.     Tenderness: There is no abdominal tenderness.  Lymphadenopathy:     Cervical: No cervical adenopathy.  Skin:    General: Skin is warm.     Findings: No erythema or rash.  Neurological:  General: No focal deficit present.     Mental Status: She is alert and oriented to person, place, and time.     Cranial Nerves: No cranial nerve deficit.     Gait: Gait normal.  Psychiatric:        Mood and Affect: Mood is anxious.   ASSESSMENT AND PLAN:  Victoria Eaton was seen today for establish care.  Diagnoses and all orders for this visit: Orders Placed This Encounter  Procedures   Comprehensive metabolic panel   Lipid panel   Microalbumin / creatinine urine ratio   POC HgB A1c   Lab Results  Component Value Date   HGBA1C 7.6 (A) 03/12/2021   Lab Results  Component Value Date   CHOL 173 03/14/2021   HDL 69.40  03/14/2021   LDLCALC 81 03/14/2021   TRIG 113.0 03/14/2021   CHOLHDL 2 03/14/2021   Lab Results  Component Value Date   CREATININE 0.89 03/14/2021   BUN 18 03/14/2021   NA 138 03/14/2021   K 4.5 03/14/2021   CL 104 03/14/2021   CO2 26 03/14/2021   Lab Results  Component Value Date   ALT 17 03/14/2021   AST 19 03/14/2021   ALKPHOS 66 03/14/2021   BILITOT 0.4 03/14/2021   Lab Results  Component Value Date   LABMICR 18.3 05/23/2019   LABMICR 9.9 05/01/2017   MICROALBUR 49.1 (H) 03/14/2021   Type 2 diabetes mellitus with complication, with long-term current use of insulin ([place]) HgA1C is not at goal. Toujeo increased from 44 U to 48 U. No changes in Trulicity, [employer] dose. Regular exercise and healthy diet with avoidance of added sugar food intake is an important part of treatment and recommended. Annual eye exam, periodic dental and foot care recommended. F/U in 4 months  Hyperlipidemia LDL goal <70 Continue Crestor 40 mg daily. Low fat diet also recommended.  Chronic bilateral low back pain, unspecified whether sciatica present I will be glad to continue Methocarbamol 500 mg bid. In regard to Tramadol and Percocet, I prefers for ortho to continue or we can arrange appt with pain management. Continue following with ortho.  Essential hypertension BP adequately controlled. Continue Metoprolol tartrate 50 mg bid,Lisinopril 5 mg daily,Amlodipine 5 mg daily,and Imdur 120 mg daily. Monitor BP regularly. Low salt diet also recommended.  Recurrent major depressive disorder, in partial remission ([place]) Problem doe snot seem to be well controlled. Tramadol and Gabapentin could aggravate problem. Reviewing records, it seems like she was started on Wellbutrin 300 mg in 10/2018. I cannot find when dose was increased from 300 mg to 600 mg, explained I do not feel comfortable with this dose, max recommended is 450 mg. Saw that Sertraline was increased to 100 mg 08/22/19,  increased to 200 mg on 01/27/20 and referred to psychologist. I think she will benefit from establishing with psychiatrist.  Anxiety disorder, unspecified type Continue Sertraline 200 mg daily. Wellbutrin can aggravate problem.  Spent 40 minutes with pt.  During this time history was obtained and documented, examination was performed, prior labs/records reviewed, and assessment/plan discussed.  Return in about 4 months (around 07/12/2021) for fasting labs this Thursday.  Joanna Hall G. Martinique, MD  [facility]. [place] office.  A few things to remember from today's visit:   Hyperlipidemia LDL goal <70  Type 2 diabetes mellitus with complication, with long-term current use of insulin ([place]), Chronic - Plan: POC HgB A1c  Essential hypertension  Anxiety and depression  Chronic bilateral low back pain, unspecified  whether sciatica present  If you need refills please call your pharmacy. Do not use My Chart to request refills or for acute issues that need immediate attention.   We can arrange appt with pain management or you can request your pain medication from your orthopedist. Toujeo increased from 57 to 78. No changes in rest of medications.   Please be sure medication list is accurate. If a new problem present, please set up appointment sooner than planned today.

## 2021-03-14 ENCOUNTER — Ambulatory Visit: Payer: Self-pay

## 2021-03-14 ENCOUNTER — Ambulatory Visit (INDEPENDENT_AMBULATORY_CARE_PROVIDER_SITE_OTHER): Payer: 59

## 2021-03-14 ENCOUNTER — Encounter: Payer: Self-pay | Admitting: Orthopaedic Surgery

## 2021-03-14 ENCOUNTER — Ambulatory Visit (INDEPENDENT_AMBULATORY_CARE_PROVIDER_SITE_OTHER): Payer: 59 | Admitting: Orthopaedic Surgery

## 2021-03-14 ENCOUNTER — Other Ambulatory Visit (INDEPENDENT_AMBULATORY_CARE_PROVIDER_SITE_OTHER): Payer: 59

## 2021-03-14 ENCOUNTER — Other Ambulatory Visit: Payer: Self-pay

## 2021-03-14 VITALS — Ht 66.0 in | Wt 199.0 lb

## 2021-03-14 DIAGNOSIS — M79644 Pain in right finger(s): Secondary | ICD-10-CM

## 2021-03-14 DIAGNOSIS — E785 Hyperlipidemia, unspecified: Secondary | ICD-10-CM

## 2021-03-14 DIAGNOSIS — Z794 Long term (current) use of insulin: Secondary | ICD-10-CM | POA: Diagnosis not present

## 2021-03-14 DIAGNOSIS — I10 Essential (primary) hypertension: Secondary | ICD-10-CM | POA: Diagnosis not present

## 2021-03-14 DIAGNOSIS — G5603 Carpal tunnel syndrome, bilateral upper limbs: Secondary | ICD-10-CM | POA: Diagnosis not present

## 2021-03-14 DIAGNOSIS — M65342 Trigger finger, left ring finger: Secondary | ICD-10-CM | POA: Diagnosis not present

## 2021-03-14 DIAGNOSIS — E118 Type 2 diabetes mellitus with unspecified complications: Secondary | ICD-10-CM | POA: Diagnosis not present

## 2021-03-14 DIAGNOSIS — M79645 Pain in left finger(s): Secondary | ICD-10-CM

## 2021-03-14 DIAGNOSIS — M65331 Trigger finger, right middle finger: Secondary | ICD-10-CM

## 2021-03-14 LAB — COMPREHENSIVE METABOLIC PANEL
ALT: 17 U/L (ref 0–35)
AST: 19 U/L (ref 0–37)
Albumin: 3.9 g/dL (ref 3.5–5.2)
Alkaline Phosphatase: 66 U/L (ref 39–117)
BUN: 18 mg/dL (ref 6–23)
CO2: 26 mEq/L (ref 19–32)
Calcium: 9.3 mg/dL (ref 8.4–10.5)
Chloride: 104 mEq/L (ref 96–112)
Creatinine, Ser: 0.89 mg/dL (ref 0.40–1.20)
GFR: 69.59 mL/min (ref >60.00)
Glucose, Bld: 166 mg/dL — ABNORMAL HIGH (ref 70–99)
Potassium: 4.5 mEq/L (ref 3.5–5.1)
Sodium: 138 mEq/L (ref 135–145)
Total Bilirubin: 0.4 mg/dL (ref 0.2–1.2)
Total Protein: 7.4 g/dL (ref 6.0–8.3)

## 2021-03-14 LAB — MICROALBUMIN / CREATININE URINE RATIO
Creatinine,U: 53 mg/dL
Microalb Creat Ratio: 92.8 mg/g — ABNORMAL HIGH (ref 0.0–30.0)
Microalb, Ur: 49.1 mg/dL — ABNORMAL HIGH (ref 0.0–1.9)

## 2021-03-14 LAB — LIPID PANEL
Cholesterol: 173 mg/dL (ref 0–200)
HDL: 69.4 mg/dL (ref >39.00)
LDL Cholesterol: 81 mg/dL (ref 0–99)
NonHDL: 103.44
Total CHOL/HDL Ratio: 2
Triglycerides: 113 mg/dL (ref 0.0–149.0)
VLDL: 22.6 mg/dL (ref 0.0–40.0)

## 2021-03-14 MED ORDER — BUPIVACAINE HCL 0.25 % IJ SOLN
0.3300 mL | INTRAMUSCULAR | Status: AC | PRN
  Administered 2021-03-14: .33 mL

## 2021-03-14 MED ORDER — LIDOCAINE HCL 1 % IJ SOLN
1.0000 mL | INTRAMUSCULAR | Status: AC | PRN
  Administered 2021-03-14: 1 mL

## 2021-03-14 MED ORDER — METHYLPREDNISOLONE ACETATE 40 MG/ML IJ SUSP
13.3300 mg | INTRAMUSCULAR | Status: AC | PRN
  Administered 2021-03-14: 13.33 mg

## 2021-03-14 NOTE — Progress Notes (Signed)
Office Visit Note   [name]           Date of Birth: Aug 27, 1961           MRN: [id number] Visit Date: 03/14/2021              Requested by: No referring provider defined for this encounter. PCP: Martinique, Betty G, MD   Assessment & Plan: Visit Diagnoses:  1. Pain of right thumb   2. Bilateral carpal tunnel syndrome   3. Trigger finger, right middle finger   4. Trigger finger, left ring finger     Plan: Impression is bilateral carpal tunnel syndrome, severe in nature, recurrent right long trigger finger, right thumb CMC joint osteoarthritis, left hand carpal tunnel syndrome and left index finger trigger finger.  We have had a discussion on various treatment options to include cortisone injections and surgical intervention.  In regards to the right hand she would like to go ahead and proceed with right carpal tunnel release and right long trigger finger release.  She is a type II diabetic but her last A1c was 7.6.  She is on Plavix and aspirin for her cardiac history so we will have her stop this prior to surgery.  In regards to the right thumb CMC joint pain, we will wait until after surgical intervention on the right hand before proceeding.  In regards to the left index trigger finger, we will inject this with cortisone today as well.  Follow-up with [place] after surgery.  Follow-Up Instructions: No follow-ups on file.   Orders:  Orders Placed This Encounter  Procedures   XR Hand Complete Left   XR Hand Complete Right   No orders of the defined types were placed in this encounter.     Procedures: Hand/UE Inj: L index A1 for trigger finger on 03/14/2021 10:49 AM Indications: pain Details: 25 G needle Medications: 1 mL lidocaine 1 %; 0.33 mL bupivacaine 0.25 %; 13.33 mg methylPREDNISolone acetate 40 MG/ML     Clinical Data: No additional findings.   Subjective: Chief Complaint  Patient presents with   Left Hand - Pain   Right Hand - Pain    HPI patient is  a pleasant 60 year old right-hand-dominant female who comes in today with recurrent bilateral hand pain and paresthesias.  She has underlying bilateral carpal tunnel syndrome severe in nature for which we have injected in the past with mild relief of symptoms.  She also has a history of right long trigger finger which has been injected twice in the past.  This is recently flared up and is causing pain and triggering.  New complaints include right thumb pain and left index finger pain.  In regards to the right thumb, she has pain primarily to the base worse with any movement of the thumb to include gripping or opening jars.  No locking.  In regards to the left index finger, she is having pain to the A1 pulley in addition to triggering.  She has not previously had injection to that trigger finger.  Of note, she is a type II diabetic with a recent hemoglobin A1c of 7.6.  Review of Systems as detailed in HPI.  All others reviewed and are negative.   Objective: Vital Signs: Ht 5' 6"  (1.676 m)   Wt 199 lb (90.3 kg)   BMI 32.12 kg/m   Physical Exam well-developed and well-nourished female in no acute distress.  Alert and oriented x3.  Ortho Exam examination of her right  hand reveals positive Phalen and positive Tinel.  No thenar atrophy.  Mild tenderness to the A1 pulley of the long finger.  No reproducible triggering.  Right thumb exam shows moderate tenderness to the [facility] joint with a positive grind test.  She has mild tenderness to the first dorsal compartment with a minimally positive Wynn Maudlin.  Left hand exam shows a positive Phalen and positive Tinel.  No thenar atrophy.  Left index finger shows a moderately tender and palpable nodule at the A1 pulley.  She does exhibit reproducible triggering today.  Fingers are warm and well-perfused.  She is neurovascular intact distally.  Specialty Comments:  No specialty comments available.  Imaging: XR Hand Complete Left  Result Date: 03/14/2021 Mild  degenerative changes to the first [facility] joint  XR Hand Complete Right  Result Date: 03/14/2021 Mild degenerative changes to the first [facility] joint    PMFS History: Patient Active Problem List   Diagnosis Date Noted   Complete uterine prolapse 03/09/2021   Non-ST elevation (NSTEMI) myocardial infarction ([place]) 02/17/2018   Type 2 diabetes mellitus with complication, with long-term current use of insulin ([place]) 02/17/2018   Chest pain 01/01/2018   Unstable angina ([place]) 01/01/2018   Arm contusion, left, initial encounter 11/25/2017   Contusion of right knee 11/25/2017   Hx of CABG 10/19/2017   Epigastric pain    Pancreatitis 10/10/2017   Hyperlipidemia LDL goal <70 09/14/2017   Essential hypertension 05/01/2017   Anxiety and depression 05/01/2017   Tobacco abuse 05/01/2017   History of MI (myocardial infarction) 05/01/2017   Allergic rhinitis 05/01/2017   History of arthritis 05/01/2017   Bladder prolapse, female, acquired 11/09/2013   Past Medical History:  Diagnosis Date   Anxiety    Arthritis    "maybe in my left foot" (10/14/2017)   Bladder prolapse, female, acquired 09/14/2017   Depression    High cholesterol    History of stomach ulcers    Hypertension    Myocardial infarction ([place]) 12/30/2004   Tobacco abuse    Type II diabetes mellitus ([place])     Family History  Problem Relation Age of Onset   Heart disease Father    Hyperlipidemia Father    Hypertension Father     Past Surgical History:  Procedure Laterality Date   CARDIAC CATHETERIZATION  10/14/2017   CORONARY ANGIOPLASTY WITH STENT PLACEMENT  12/30/2004   Patient reported   CORONARY ARTERY BYPASS GRAFT N/A 10/19/2017   Procedure: CORONARY ARTERY BYPASS GRAFTING (CABG) times four using the right saphaneous vein. Harvested endoscopicly and left internal mammary artery.;  Surgeon: Grace Isaac, MD;  Location: [place];  Service: Open Heart Surgery;  Laterality: N/A;   DILATION AND CURETTAGE OF UTERUS  1980s    LEFT HEART CATH AND CORONARY ANGIOGRAPHY N/A 10/14/2017   Procedure: LEFT HEART CATH AND CORONARY ANGIOGRAPHY;  Surgeon: Martinique, Peter M, MD;  Location: [place] [facility];  Service: Cardiovascular;  Laterality: N/A;   LEFT HEART CATH AND CORS/GRAFTS ANGIOGRAPHY N/A 01/01/2018   Procedure: LEFT HEART CATH AND CORS/GRAFTS ANGIOGRAPHY;  Surgeon: Jettie Booze, MD;  Location: [place] [facility];  Service: Cardiovascular;  Laterality: N/A;   LEFT HEART CATH AND CORS/GRAFTS ANGIOGRAPHY N/A 08/16/2018   Procedure: LEFT HEART CATH AND CORS/GRAFTS ANGIOGRAPHY;  Surgeon: Nelva Bush, MD;  Location: [place] [facility];  Service: Cardiovascular;  Laterality: N/A;   PILONIDAL CYST EXCISION  1980s   TEE WITHOUT CARDIOVERSION N/A 10/19/2017   Procedure: TRANSESOPHAGEAL ECHOCARDIOGRAM (TEE);  Surgeon: Grace Isaac, MD;  Location: [place];  Service: Open Heart Surgery;  Laterality: N/A;   Social History   Occupational History   Not on file  Tobacco Use   Smoking status: Some Days    Packs/day: 0.12    Years: 45.00    Pack years: 5.40    Types: Cigarettes    Start date: 05/01/1972    Last attempt to quit: 09/30/2017    Years since quitting: 3.4   Smokeless tobacco: Never  Vaping Use   Vaping Use: Never used  Substance and Sexual Activity   Alcohol use: Yes    Alcohol/week: 20.0 standard drinks    Types: 20 Glasses of wine per week    Comment: 4 glasses red wine at night   Drug use: No   Sexual activity: Not Currently

## 2021-03-15 ENCOUNTER — Telehealth: Payer: Self-pay | Admitting: Internal Medicine

## 2021-03-15 ENCOUNTER — Encounter (HOSPITAL_BASED_OUTPATIENT_CLINIC_OR_DEPARTMENT_OTHER): Payer: Self-pay

## 2021-03-15 NOTE — Telephone Encounter (Signed)
Left voice message to call back on 03/15/2021 at 12:06 PM.  Patient is call back.

## 2021-03-15 NOTE — Telephone Encounter (Signed)
   [facility] Pre-operative Risk Assessment    Patient Name: Victoria Eaton  DOB: 1961/02/25  MRN: [id number]   HEARTCARE STAFF: - Please ensure there is not already an duplicate clearance open for this procedure. - Under Visit Info/Reason for Call, type in Other and utilize the format Clearance MM/DD/YY or Clearance TBD. Do not use dashes or single digits. - If request is for dental extraction, please clarify the # of teeth to be extracted. - If the patient is currently at the dentist's office, call Pre-Op APP to address. If the patient is not currently in the dentist office, please route to the Pre-Op pool  Request for surgical clearance:  What type of surgery is being performed? Right carpal tunnel release and right long finger trigger release   When is this surgery scheduled? 03/21/2021  What type of clearance is required (medical clearance vs. Pharmacy clearance to hold med vs. Both)? Pharmacy   Are there any medications that need to be held prior to surgery and how long?Playvix and a week  Practice name and name of physician performing surgery? [facility] and Dr.Michael Erlinda Hong  What is the office phone number? [phone]   7.   What is the office fax number? [phone]  8.   Anesthesia type (None, local, MAC, general) ? MAC   [name] 03/15/2021, 11:51 AM  _________________________________________________________________   (provider comments below)

## 2021-03-17 ENCOUNTER — Encounter: Payer: Self-pay | Admitting: Family Medicine

## 2021-03-18 ENCOUNTER — Other Ambulatory Visit: Payer: Self-pay

## 2021-03-18 ENCOUNTER — Encounter: Payer: Self-pay | Admitting: Family Medicine

## 2021-03-20 ENCOUNTER — Ambulatory Visit (INDEPENDENT_AMBULATORY_CARE_PROVIDER_SITE_OTHER): Payer: 59 | Admitting: Psychology

## 2021-03-20 DIAGNOSIS — F419 Anxiety disorder, unspecified: Secondary | ICD-10-CM

## 2021-03-20 DIAGNOSIS — F329 Major depressive disorder, single episode, unspecified: Secondary | ICD-10-CM

## 2021-03-27 ENCOUNTER — Encounter (HOSPITAL_BASED_OUTPATIENT_CLINIC_OR_DEPARTMENT_OTHER): Payer: Self-pay | Admitting: Obstetrics & Gynecology

## 2021-03-27 ENCOUNTER — Other Ambulatory Visit: Payer: Self-pay

## 2021-03-27 ENCOUNTER — Ambulatory Visit (INDEPENDENT_AMBULATORY_CARE_PROVIDER_SITE_OTHER): Payer: 59 | Admitting: Obstetrics & Gynecology

## 2021-03-27 VITALS — BP 139/71 | HR 63 | Ht 66.0 in | Wt 198.0 lb

## 2021-03-27 DIAGNOSIS — R102 Pelvic and perineal pain: Secondary | ICD-10-CM | POA: Diagnosis not present

## 2021-03-27 DIAGNOSIS — N813 Complete uterovaginal prolapse: Secondary | ICD-10-CM

## 2021-03-28 ENCOUNTER — Ambulatory Visit (HOSPITAL_BASED_OUTPATIENT_CLINIC_OR_DEPARTMENT_OTHER): Payer: 59 | Admitting: Obstetrics & Gynecology

## 2021-03-28 ENCOUNTER — Ambulatory Visit (INDEPENDENT_AMBULATORY_CARE_PROVIDER_SITE_OTHER): Payer: 59 | Admitting: Psychology

## 2021-03-28 ENCOUNTER — Encounter: Payer: 59 | Admitting: Orthopaedic Surgery

## 2021-03-28 DIAGNOSIS — F329 Major depressive disorder, single episode, unspecified: Secondary | ICD-10-CM | POA: Diagnosis not present

## 2021-03-28 DIAGNOSIS — F419 Anxiety disorder, unspecified: Secondary | ICD-10-CM

## 2021-04-02 NOTE — Progress Notes (Signed)
60 y.o. Widowed White female [id number] here for pessary placement.  She was seen 03/08/2021 for new patient appt.  She has seen Dr. Wannetta Sender and is planning surgery in the future.  She is having enough pain from prolapse that she decided to come for pessary fitting.  She has seen Dr. Zigmund Daniel in the past and had a Gelhorn pessary placed.  This was difficult to remove so didn't want another pessary at first but has changed her mind.  Pessary fitting occurred 03/08/2021 and #6 incontinence ring with support seemed to be the best fit but this has to be ordered.  It is here today.  Past Medical History:  Diagnosis Date   Anxiety    Arthritis    "maybe in my left foot" (10/14/2017)   Bladder prolapse, female, acquired 09/14/2017   Depression    High cholesterol    History of stomach ulcers    Hypertension    Myocardial infarction ([place]) 12/30/2004   Tobacco abuse    Type II diabetes mellitus ([place])    Current Outpatient Medications on File Prior to Visit  Medication Sig Dispense Refill   amLODipine (NORVASC) 5 MG tablet TAKE ONE TABLET BY MOUTH ONE TIME DAILY 90 tablet 3   aspirin 81 MG EC tablet Take 1 tablet (81 mg total) by mouth daily. 90 tablet 3   clopidogrel (PLAVIX) 75 MG tablet Take 1 tablet (75 mg total) by mouth daily. 90 tablet 2   Continuous Blood Gluc Sensor (FREESTYLE LIBRE 14 DAY SENSOR) MISC APPLY ONE SENSOR TO THE BACK OF YOUR UPPER ARM. REPLACE EVERY 14 DAYS. 62 each 5   dapagliflozin propanediol (FARXIGA) 5 MG TABS tablet Take 1 tablet (5 mg total) by mouth daily before breakfast. 90 tablet 3   Dulaglutide (TRULICITY) 1.5 SH/7.62YO SOPN Inject 1.5 mg into the skin once a week. 0.5 mL 6   gabapentin (NEURONTIN) 100 MG capsule TAKE ONE CAPSULE BY MOUTH THREE TIMES A DAY 90 capsule 3   insulin glargine, 2 Unit Dial, (TOUJEO MAX SOLOSTAR) 300 UNIT/ML Solostar Pen INJECT 55 UNITS UNDER THE SKIN EVERY MORNING (Patient taking differently: INJECT 55 UNITS UNDER THE SKIN EVERY  MORNING Patient injects 42 units) 18 mL 2   Insulin Pen Needle (ULTICARE MICRO PEN NEEDLES) 32G X 4 MM MISC USE WITH INJECTIONS ONE TIME DAILY 400 each 0   isosorbide mononitrate (IMDUR) 120 MG 24 hr tablet Take 1 tablet (120 mg total) by mouth daily. 90 tablet 3   lisinopril (ZESTRIL) 5 MG tablet Take 1 tablet (5 mg total) by mouth daily. 90 tablet 1   Magnesium Hydroxide (MAGNESIA PO) Take by mouth.     methocarbamol (ROBAXIN) 500 MG tablet Take 1 tablet (500 mg total) by mouth 2 (two) times daily as needed. 20 tablet 0   metoprolol tartrate (LOPRESSOR) 50 MG tablet TAKE ONE TABLET BY MOUTH TWICE A DAY 30 tablet 6   Multiple Vitamin (MULTIVITAMIN WITH MINERALS) TABS tablet Take 1 tablet by mouth daily.     Omega-3 Fatty Acids (FISH OIL PO) Take by mouth.     rosuvastatin (CRESTOR) 40 MG tablet Take 1 tablet (40 mg total) by mouth daily. 90 tablet 3   sertraline (ZOLOFT) 100 MG tablet TAKE TWO TABLETS BY MOUTH ONE TIME DAILY 180 tablet 0   varenicline (CHANTIX) 1 MG tablet Take 1 tablet (1 mg total) by mouth 2 (two) times daily. 60 tablet 2   buPROPion (WELLBUTRIN XL) 300 MG 24 hr tablet Take 300 mg  by mouth daily. (Patient not taking: Reported on 03/27/2021)     nitroGLYCERIN (NITROSTAT) 0.4 MG SL tablet Place 1 tablet (0.4 mg total) under the tongue every 5 (five) minutes as needed for chest pain. 25 tablet 3   No current facility-administered medications on file prior to visit.   Allergies  Allergen Reactions   Januvia [Sitagliptin] Nausea And Vomiting and Other (See Comments)    Pancreatitis (this was in Epic, but patient was unaware of this)    Metformin And Related Diarrhea   Penicillins Other (See Comments)    Unknown childhood reaction Has patient had a PCN reaction causing immediate rash, facial/tongue/throat swelling, SOB or lightheadedness with hypotension: Unknown Has patient had a PCN reaction causing severe rash involving mucus membranes or skin necrosis: Unknown Has patient  had a PCN reaction that required hospitalization: Unknown Has patient had a PCN reaction occurring within the last 10 years: No If all of the above answers are "NO", then may proceed with Cephalosporin use.    Exam:   BP 139/71   Pulse 63   Ht 5' 6"  (1.676 m)   Wt 198 lb (89.8 kg)   BMI 31.96 kg/m   General appearance: alert, cooperative, and no distress Inguinal lymph nodes:  not enlarged  Pelvic: External genitalia:  no lesions              Urethra: normal appearing urethra with no masses, tenderness or lesions              Bartholins and Skenes: Bartholin's, Urethra, Skene's normal                 Vagina: normal appearing vagina with normal color and discharge, no lesions              Cervix:  past introitus with tissue thickening consistent with rubbing pad/underwear  #6 incontinence ring was placed without difficulty.   Pt was able to void, walk and could not expel pessary with valsalva maneuver. Patient tolerated procedure well.    1. Complete uterine prolapse - pessary placed today.  Recheck in 3 months if does not have surgical plan with Dr. Wannetta Sender.  Urodynamics is scheduled for 05/02/2021.  2. Pelvic pain - will need to monitor to see if improves with pessary but pt already felt improvement with uterus in more normal positioning with pessary in place

## 2021-04-03 NOTE — Progress Notes (Signed)
Chart reviewed with Dr Sabra Heck, due to patient's extensive cardiac history she will be better served being done at [facility]. Debbie at Dr Phoebe Sharps office notified.

## 2021-04-05 ENCOUNTER — Telehealth: Payer: Self-pay

## 2021-04-05 NOTE — Telephone Encounter (Signed)
   [facility] Pre-operative Risk Assessment    Patient Name: Victoria Eaton  DOB: 1960/10/20 MRN: [id number]  HEARTCARE STAFF:  - IMPORTANT!!!!!! Under Visit Info/Reason for Call, type in Other and utilize the format Clearance MM/DD/YY or Clearance TBD. Do not use dashes or single digits. - Please review there is not already an duplicate clearance open for this procedure. - If request is for dental extraction, please clarify the # of teeth to be extracted. - If the patient is currently at the dentist's office, call Pre-Op Callback Staff (MA/nurse) to input urgent request.  - If the patient is not currently in the dentist office, please route to the Pre-Op pool.  Request for surgical clearance:  What type of surgery is being performed? Carpal tunnel release and Right Trigger Finger release  When is this surgery scheduled? 04/10/21  What type of clearance is required (medical clearance vs. Pharmacy clearance to hold med vs. Both)? BOTH  Are there any medications that need to be held prior to surgery and how long? Plavix- would like to hold one week   Practice name and name of physician performing surgery? Marianna Payment, MD  What is the office phone number? [phone]   7.   What is the office fax number? [place]   Anesthesia type (None, local, MAC, general) ? Unknown   Neriah Brott B Zayan Delvecchio 04/05/2021, 5:27 PM  _________________________________________________________________   (provider comments below)

## 2021-04-08 NOTE — Telephone Encounter (Signed)
Francee Setzer 60 year old female is requesting a carpal tunnel release and trigger finger release.  She was last seen in the clinic on October 22, 2020.  At that time she reported she had a difficult Dec 02, 2019.  Her parents had passed away as well also her husband.  She was receiving counseling and on 2 antidepressant medications.  She had started smoking again.  She denied chest pain and shortness of breath.  She was compliant with her DAPT.   Her PMH includes PCI to her RCA in 12/01/04, STEMI revealing multivessel coronary artery disease, status post CABG x4 1/19.  She was admitted in April, underwent cardiac catheterization which showed occlusion of her SVG-OM and diagonal.  She was admitted again in May with chest pain.  Her troponins were elevated.  No cardiac catheterization was performed and medical management was recommended.  She underwent repeat cardiac catheterization 11/19 which showed distally occluded LAD, patent LIMA-LAD and SVG-RCA, SVG-D1 and SVG-OM1 chronically occluded and no significant changes from the previous study.  May her Plavix be held prior to her surgery?  Thank you for your help.  Please direct response to [facility] preop pool.  Jossie Ng. Shifa Brisbon NP-C    04/08/2021, 12:17 PM [address] Office [phone] Fax [phone]

## 2021-04-08 NOTE — Telephone Encounter (Signed)
Patient returning call from our office

## 2021-04-09 ENCOUNTER — Telehealth: Payer: Self-pay | Admitting: General Practice

## 2021-04-09 ENCOUNTER — Other Ambulatory Visit: Payer: Self-pay

## 2021-04-09 ENCOUNTER — Encounter (HOSPITAL_COMMUNITY): Payer: Self-pay | Admitting: Orthopaedic Surgery

## 2021-04-09 NOTE — Telephone Encounter (Signed)
Left voicemail.  Patient needs call back

## 2021-04-09 NOTE — Progress Notes (Addendum)
Victoria Eaton denies chest pain or shortness of breath. Patient denies having any s/s of Covid in her household.  Patient denies any known exposure to Covid.   Victoria Eaton has type II diabetes, patient has a Free style glucose reader-CBG at this time was 170. Last A1C was 7.6,  Dr Betty Martinique is Victoria Eaton endocrinologist. I instructed Victoria Eaton to not take Victoria Eaton in am, I instructed patient hat if CBG is greater than 70 to take 1/2 of scheduled Toujeo - 27 units. I instructed patient to check CBG after awaking and every 2 hours until arrival  to the hospital.  I Instructed patient if CBG is less than 70 to take 4 Glucose Tablets or 1 tube of Glucose Gel or 1/2 cup of a clear juice. Recheck CBG in 15 minutes if CBG is not over 70 call, pre- op desk at [phone] for further instructions. If scheduled to receive Insulin, do not take Insulin  Victoria Eaton stated that it would not be under 13.  Victoria Eaton reported that she stopped taking Plavix, "a long time ago, when surgery was originally scheduled." Patient does not know when she stopped Plavix. In reading the cardiac clearance- patient would have stopped Plavix on 03/16/21. I called [facility] in [place], [place], I was told that I will be receiving a call back from Victoria Salvo, NP, who said.since surgery is tomorrow, surgery can be done and Plavix should be started back as soon as possibe.   I instructed patient to Victoria Eaton with antibiotic soap, if it is available.  Dry off with a clean towel. Do not put lotion, powder, cologne or deodorant or makeup.No jewelry or piercings.. DO  not shave. Today or tomorrow , before surgery. No nail polish, artificial or acrylic nails. Wear clean clothes, brush your teeth. Glasses, contact lens,dentures or partials may not be worn in the OR. If you need to wear them, please bring a case for glasses, do not wear contacts or bring a case, the hospital does not have contact cases,  dentures or partials will have to be removed , make sure they are clean, we will provide a denture cup to put them in. You will need some one to derive you home and a responsible person over the age of 78 to stay with you for the first 24 hours after surgery.

## 2021-04-09 NOTE — Telephone Encounter (Signed)
   Primary Cardiologist: Pixie Casino, MD  Chart reviewed as part of pre-operative protocol coverage. Given past medical history and time since last visit, based on ACC/AHA guidelines, Desma Wilkowski would be at acceptable risk for the planned procedure without further cardiovascular testing.   Her RCRI is a class III risk, 6.6% risk of major cardiac event.  She is able to complete greater than 4 METS of physical activity.  Her Plavix may be held for 5 days prior to her surgery.  Please resume as soon as hemostasis is achieved.  I will route this recommendation to the requesting party via Epic fax function and remove from pre-op pool.  Please call with questions.  Jossie Ng. Dandra Shambaugh NP-C    04/09/2021, 4:50 PM [facility] [place] [address] Office [phone] Fax [phone]

## 2021-04-09 NOTE — Telephone Encounter (Signed)
Ok to hold Plavix 5 days prior to surgery - restart after when safe from a bleeding standpoint.  Dr. Debara Pickett

## 2021-04-09 NOTE — Telephone Encounter (Signed)
Follow Up:     Patient is returning jesse's call from today

## 2021-04-10 ENCOUNTER — Ambulatory Visit (HOSPITAL_COMMUNITY): Payer: 59 | Admitting: Anesthesiology

## 2021-04-10 ENCOUNTER — Other Ambulatory Visit: Payer: Self-pay

## 2021-04-10 ENCOUNTER — Ambulatory Visit (HOSPITAL_COMMUNITY)
Admission: RE | Admit: 2021-04-10 | Discharge: 2021-04-10 | Disposition: A | Discharge status: Home / Self Care | Payer: 59 | Attending: Orthopaedic Surgery | Admitting: Orthopaedic Surgery

## 2021-04-10 ENCOUNTER — Encounter (HOSPITAL_COMMUNITY): Payer: Self-pay | Admitting: Orthopaedic Surgery

## 2021-04-10 ENCOUNTER — Encounter (HOSPITAL_COMMUNITY)
Admission: RE | Disposition: A | Discharge status: Home / Self Care | Payer: Self-pay | Source: Home / Self Care | Attending: Orthopaedic Surgery

## 2021-04-10 DIAGNOSIS — M65331 Trigger finger, right middle finger: Secondary | ICD-10-CM

## 2021-04-10 DIAGNOSIS — Z888 Allergy status to other drugs, medicaments and biological substances status: Secondary | ICD-10-CM | POA: Diagnosis not present

## 2021-04-10 DIAGNOSIS — G5601 Carpal tunnel syndrome, right upper limb: Secondary | ICD-10-CM

## 2021-04-10 DIAGNOSIS — Z8249 Family history of ischemic heart disease and other diseases of the circulatory system: Secondary | ICD-10-CM | POA: Diagnosis not present

## 2021-04-10 DIAGNOSIS — Z880 Allergy status to penicillin: Secondary | ICD-10-CM | POA: Diagnosis not present

## 2021-04-10 DIAGNOSIS — Z79899 Other long term (current) drug therapy: Secondary | ICD-10-CM | POA: Diagnosis not present

## 2021-04-10 DIAGNOSIS — E119 Type 2 diabetes mellitus without complications: Secondary | ICD-10-CM | POA: Insufficient documentation

## 2021-04-10 DIAGNOSIS — F17210 Nicotine dependence, cigarettes, uncomplicated: Secondary | ICD-10-CM | POA: Insufficient documentation

## 2021-04-10 DIAGNOSIS — Z794 Long term (current) use of insulin: Secondary | ICD-10-CM | POA: Diagnosis not present

## 2021-04-10 DIAGNOSIS — Z7982 Long term (current) use of aspirin: Secondary | ICD-10-CM | POA: Diagnosis not present

## 2021-04-10 HISTORY — PX: TRIGGER FINGER RELEASE: SHX641

## 2021-04-10 HISTORY — PX: CARPAL TUNNEL RELEASE: SHX101

## 2021-04-10 HISTORY — DX: Atherosclerotic heart disease of native coronary artery without angina pectoris: I25.10

## 2021-04-10 LAB — CBC
HCT: 37.3 % (ref 36.0–46.0)
Hemoglobin: 11.3 g/dL — ABNORMAL LOW (ref 12.0–15.0)
MCH: 23.9 pg — ABNORMAL LOW (ref 26.0–34.0)
MCHC: 30.3 g/dL (ref 30.0–36.0)
MCV: 79 fL — ABNORMAL LOW (ref 80.0–100.0)
Platelets: 173 10*3/uL (ref 150–400)
RBC: 4.72 MIL/uL (ref 3.87–5.11)
RDW: 18.2 % — ABNORMAL HIGH (ref 11.5–15.5)
WBC: 7.1 10*3/uL (ref 4.0–10.5)
nRBC: 0 % (ref 0.0–0.2)

## 2021-04-10 LAB — GLUCOSE, CAPILLARY
Glucose-Capillary: 189 mg/dL — ABNORMAL HIGH (ref 70–99)
Glucose-Capillary: 140 mg/dL — ABNORMAL HIGH (ref 70–99)

## 2021-04-10 SURGERY — CARPAL TUNNEL RELEASE
Anesthesia: Monitor Anesthesia Care | Site: Hand | Laterality: Right

## 2021-04-10 MED ORDER — LACTATED RINGERS IV SOLN: INTRAVENOUS | Status: DC

## 2021-04-10 MED ORDER — OXYCODONE HCL 5 MG/5ML PO SOLN: 5.0000 mg | Freq: Once | ORAL | Status: DC | PRN

## 2021-04-10 MED ORDER — PHENYLEPHRINE 40 MCG/ML (10ML) SYRINGE FOR IV PUSH (FOR BLOOD PRESSURE SUPPORT)
PREFILLED_SYRINGE | INTRAVENOUS | Status: AC
  Filled 2021-04-10: qty 10

## 2021-04-10 MED ORDER — ACETAMINOPHEN 500 MG PO TABS
1000.0000 mg | ORAL_TABLET | Freq: Once | ORAL | Status: AC
  Administered 2021-04-10: 1000 mg via ORAL
  Filled 2021-04-10: qty 2

## 2021-04-10 MED ORDER — MIDAZOLAM HCL 2 MG/2ML IJ SOLN
INTRAMUSCULAR | Status: DC | PRN
  Administered 2021-04-10: 2 mg via INTRAVENOUS

## 2021-04-10 MED ORDER — ONDANSETRON HCL 4 MG/2ML IJ SOLN
INTRAMUSCULAR | Status: DC | PRN
  Administered 2021-04-10: 4 mg via INTRAVENOUS

## 2021-04-10 MED ORDER — OXYCODONE HCL 5 MG PO TABS: 5.0000 mg | ORAL_TABLET | Freq: Once | ORAL | Status: DC | PRN

## 2021-04-10 MED ORDER — OXYCODONE-ACETAMINOPHEN 5-325 MG PO TABS: 1.0000 | ORAL_TABLET | Freq: Three times a day (TID) | ORAL | 0 refills | Status: DC | PRN

## 2021-04-10 MED ORDER — MIDAZOLAM HCL 2 MG/2ML IJ SOLN
INTRAMUSCULAR | Status: AC
  Filled 2021-04-10: qty 2

## 2021-04-10 MED ORDER — CHLORHEXIDINE GLUCONATE 0.12 % MT SOLN
15.0000 mL | Freq: Once | OROMUCOSAL | Status: AC
  Administered 2021-04-10: 15 mL via OROMUCOSAL
  Filled 2021-04-10: qty 15

## 2021-04-10 MED ORDER — ONDANSETRON HCL 4 MG/2ML IJ SOLN: 4.0000 mg | Freq: Once | INTRAMUSCULAR | Status: DC | PRN

## 2021-04-10 MED ORDER — SODIUM CHLORIDE 0.9 % IR SOLN
Status: DC | PRN
  Administered 2021-04-10: 1000 mL

## 2021-04-10 MED ORDER — PROPOFOL 10 MG/ML IV BOLUS
INTRAVENOUS | Status: DC | PRN
  Administered 2021-04-10 (×2): 20 mg via INTRAVENOUS

## 2021-04-10 MED ORDER — PHENYLEPHRINE 40 MCG/ML (10ML) SYRINGE FOR IV PUSH (FOR BLOOD PRESSURE SUPPORT)
PREFILLED_SYRINGE | INTRAVENOUS | Status: DC | PRN
  Administered 2021-04-10: 80 ug via INTRAVENOUS

## 2021-04-10 MED ORDER — ORAL CARE MOUTH RINSE: 15.0000 mL | Freq: Once | OROMUCOSAL | Status: AC

## 2021-04-10 MED ORDER — FENTANYL CITRATE (PF) 100 MCG/2ML IJ SOLN: 25.0000 ug | INTRAMUSCULAR | Status: DC | PRN

## 2021-04-10 MED ORDER — BUPIVACAINE HCL (PF) 0.25 % IJ SOLN
INTRAMUSCULAR | Status: DC | PRN
  Administered 2021-04-10: 18 mL

## 2021-04-10 MED ORDER — BUPIVACAINE HCL (PF) 0.25 % IJ SOLN
INTRAMUSCULAR | Status: AC
  Filled 2021-04-10: qty 30

## 2021-04-10 MED ORDER — PROPOFOL 500 MG/50ML IV EMUL
INTRAVENOUS | Status: DC | PRN
  Administered 2021-04-10: 80 ug/kg/min via INTRAVENOUS

## 2021-04-10 MED ORDER — CEFAZOLIN SODIUM-DEXTROSE 2-4 GM/100ML-% IV SOLN
2.0000 g | INTRAVENOUS | Status: AC
  Administered 2021-04-10: 2 g via INTRAVENOUS
  Filled 2021-04-10: qty 100

## 2021-04-10 MED ORDER — LIDOCAINE 2% (20 MG/ML) 5 ML SYRINGE
INTRAMUSCULAR | Status: DC | PRN
  Administered 2021-04-10: 60 mg via INTRAVENOUS

## 2021-04-10 MED ORDER — PROPOFOL 10 MG/ML IV BOLUS
INTRAVENOUS | Status: AC
  Filled 2021-04-10: qty 20

## 2021-04-10 MED ORDER — FENTANYL CITRATE (PF) 250 MCG/5ML IJ SOLN
INTRAMUSCULAR | Status: AC
  Filled 2021-04-10: qty 5

## 2021-04-10 SURGICAL SUPPLY — 35 items
BAG COUNTER SPONGE SURGICOUNT (BAG) ×2 IMPLANT
BAG SPNG CNTER NS LX DISP (BAG) ×1
BNDG CMPR 9X4 STRL LF SNTH (GAUZE/BANDAGES/DRESSINGS) ×1
BNDG ELASTIC 3X5.8 VLCR STR LF (GAUZE/BANDAGES/DRESSINGS) ×1 IMPLANT
BNDG ESMARK 4X9 LF (GAUZE/BANDAGES/DRESSINGS) ×2 IMPLANT
BRUSH SCRUB EZ 1% IODOPHOR (MISCELLANEOUS) ×1 IMPLANT
CORD BIPOLAR FORCEPS 12FT (ELECTRODE) ×1 IMPLANT
COVER SURGICAL LIGHT HANDLE (MISCELLANEOUS) ×2 IMPLANT
CUFF TOURN SGL QUICK 18X4 (TOURNIQUET CUFF) ×1 IMPLANT
DRAPE SURG 17X23 STRL (DRAPES) ×1 IMPLANT
GAUZE SPONGE 4X4 12PLY STRL (GAUZE/BANDAGES/DRESSINGS) ×2 IMPLANT
GAUZE XEROFORM 1X8 LF (GAUZE/BANDAGES/DRESSINGS) ×2 IMPLANT
GLOVE SURG LTX SZ7 (GLOVE) ×4 IMPLANT
GLOVE SURG NEOP MICRO LF SZ7.5 (GLOVE) ×4 IMPLANT
GLOVE SURG UNDER POLY LF SZ7 (GLOVE) ×12 IMPLANT
GOWN STRL REIN XL XLG (GOWN DISPOSABLE) ×4 IMPLANT
GOWN STRL REUS W/ TWL LRG LVL3 (GOWN DISPOSABLE) ×1 IMPLANT
GOWN STRL REUS W/TWL LRG LVL3 (GOWN DISPOSABLE) ×2
KIT BASIN OR (CUSTOM PROCEDURE TRAY) ×2 IMPLANT
KIT TURNOVER KIT B (KITS) ×2 IMPLANT
NS IRRIG 1000ML POUR BTL (IV SOLUTION) ×3 IMPLANT
PACK ORTHO EXTREMITY (CUSTOM PROCEDURE TRAY) ×2 IMPLANT
PAD ARMBOARD 7.5X6 YLW CONV (MISCELLANEOUS) ×2 IMPLANT
PAD CAST 3X4 CTTN HI CHSV (CAST SUPPLIES) IMPLANT
PADDING CAST ABS 4INX4YD NS (CAST SUPPLIES) ×1
PADDING CAST ABS COTTON 4X4 ST (CAST SUPPLIES) ×2 IMPLANT
PADDING CAST COTTON 3X4 STRL (CAST SUPPLIES) ×2
SLEEVE SCD COMPRESS KNEE MED (STOCKING) ×1 IMPLANT
SOL PREP POV-IOD 4OZ 10% (MISCELLANEOUS) ×1 IMPLANT
SOL PREP PROV IODINE SCRUB 4OZ (MISCELLANEOUS) ×1 IMPLANT
SUT ETHILON 4 0 PS 2 18 (SUTURE) ×3 IMPLANT
SYR CONTROL 10ML LL (SYRINGE) ×1 IMPLANT
TOWEL GREEN STERILE (TOWEL DISPOSABLE) ×2 IMPLANT
TOWEL GREEN STERILE FF (TOWEL DISPOSABLE) ×2 IMPLANT
UNDERPAD 30X36 HEAVY ABSORB (UNDERPADS AND DIAPERS) ×3 IMPLANT

## 2021-04-10 NOTE — Op Note (Signed)
   DATE OF SURGERY:04/10/2021  PREOPERATIVE DIAGNOSIS:   Right carpal tunnel syndrome Right long trigger finger  POSTOPERATIVE DIAGNOSIS: same  PROCEDURE:  Right carpal tunnel release. CPT [phone] Right long trigger finger release.  CPT 28833  SURGEON: Marianna Payment, M.D.  ASSIST: Madalyn Rob, [place]  ANESTHESIA:  Local and MAC  TOURNIQUET TIME: less than 30 minutes  BLOOD LOSS: Minimal.  COMPLICATIONS: None.  PATHOLOGY: None.  INDICATIONS: The patient is a 60 y.o. -year-old female who presented with carpal tunnel syndrome failing nonsurgical management, indicated for surgical release.  DESCRIPTION OF PROCEDURE: The patient was identified in the preoperative holding area.  The operative site was marked by the surgeon and confirmed by the patient.  The patient was brought back to the operating room.  MAC anesthesia was administered.  Local anesthetic with epi was injected into the operative sites.  A well padded nonsterile tourniquet was placed. The operative extremity was prepped and draped in standard sterile fashion.  A timeout was performed.  Preoperative antibiotics were given.   A palmar incision was made about 5 mm ulnar to the thenar crease.  The palmar aponeurosis was exposed and divided in line with the skin incision. The palmaris brevis was visualized and divided.  The distal edge of the transcarpal ligament was identified. A hemostat was inserted into the carpal tunnel to protect the median nerve and the flexor tendons. Then, the transverse carpal ligament was released under direct visualization. Proximally, a subcutaneous tunnel was made allowing a Sewell retractor to be placed. Then, the distal portion of the antebrachial fascia was released. Distally, all fibrous bands were released. The median nerve was visualized, and the fat pad was exposed. Wound was irrigated and closed with 4-0 nylon sutures.  We then made a separate vertical incision directly over the  third metacarpal head and.  Dissection was carried down through the palmar adipose tissue onto the flexor tendon sheath.  The A1 pulley was divided sharply with tenotomy scissors under direct visualization.  The palmar pulley was also divided.  The A2 pulley was left intact and.  Surgical site was then thoroughly irrigated and closed with 4-0 nylon sutures.  Sterile dressing applied. The patient was transferred to the recovery room in stable condition after all counts were correct.  POSTOPERATIVE PLAN: To start nerve gliding exercises as tolerated and no heavy lifting for four weeks.  Eduard Roux, M.D. [facility] 8:37 PM

## 2021-04-10 NOTE — Anesthesia Procedure Notes (Signed)
Procedure Name: MAC Date/Time: 04/10/2021 3:28 PM Performed by: Rande Brunt, CRNA Pre-anesthesia Checklist: Patient identified, Emergency Drugs available, Suction available and Patient being monitored Patient Re-evaluated:Patient Re-evaluated prior to induction Oxygen Delivery Method: Simple face mask Preoxygenation: Pre-oxygenation with 100% oxygen Induction Type: IV induction Placement Confirmation: CO2 detector and positive ETCO2 Dental Injury: Teeth and Oropharynx as per pre-operative assessment

## 2021-04-10 NOTE — Transfer of Care (Signed)
Immediate Anesthesia Transfer of Care Note  Patient: Jerelene Salaam  Procedure(s) Performed: RIGHT CARPAL TUNNEL RELEASE (Right: Hand) RIGHT LONG FINGER TRIGGER RELEASE (Right: Hand)  Patient Location: PACU  Anesthesia Type:MAC  Level of Consciousness: awake, alert  and oriented  Airway & Oxygen Therapy: Patient Spontanous Breathing  Post-op Assessment: Report given to RN, Post -op Vital signs reviewed and stable and Patient moving all extremities X 4  Post vital signs: Reviewed and stable  Last Vitals:  Vitals Value Taken Time  BP 131/56 04/10/21 1602  Temp    Pulse 62 04/10/21 1602  Resp 15 04/10/21 1602  SpO2 92 % 04/10/21 1602  Vitals shown include unvalidated device data.  Last Pain:  Vitals:   04/10/21 1310  TempSrc:   PainSc: 3       Patients Stated Pain Goal: 1 (63/84/66 5993)  Complications: No notable events documented.

## 2021-04-10 NOTE — Anesthesia Preprocedure Evaluation (Addendum)
Anesthesia Evaluation  Patient identified by MRN, date of birth, ID band Patient awake    Reviewed: Allergy & Precautions, NPO status , Patient's Chart, lab work & pertinent test results, reviewed documented beta blocker date and time   Airway Mallampati: II  TM Distance: >3 FB Neck ROM: Full    Dental no notable dental hx. (+) Dental Advisory Given   Pulmonary Current Smoker,  9 pack year history    Pulmonary exam normal        Cardiovascular hypertension, Pt. on medications and Pt. on home beta blockers + CAD, + Past MI and + CABG (2019)  Normal cardiovascular exam   TEE 2019 CABG: EF 45-50%. Trace MR and PR.     Neuro/Psych PSYCHIATRIC DISORDERS Anxiety Depression negative neurological ROS     GI/Hepatic PUD, (+)     substance abuse  alcohol use,   Endo/Other  diabetes, Type 2, Insulin DependentObesity BMI 32  Renal/GU negative Renal ROS     Musculoskeletal  (+) Arthritis , Osteoarthritis,    Abdominal   Peds  Hematology negative hematology ROS (+)   Anesthesia Other Findings   Reproductive/Obstetrics                           Anesthesia Physical Anesthesia Plan  ASA: 3  Anesthesia Plan: MAC   Post-op Pain Management:    Induction:   PONV Risk Score and Plan: 2 and Propofol infusion  Airway Management Planned: Natural Airway and Simple Face Mask  Additional Equipment: None  Intra-op Plan:   Post-operative Plan:   Informed Consent: I have reviewed the patients History and Physical, chart, labs and discussed the procedure including the risks, benefits and alternatives for the proposed anesthesia with the patient or authorized representative who has indicated his/her understanding and acceptance.     Dental advisory given  Plan Discussed with: CRNA and Anesthesiologist  Anesthesia Plan Comments:       Anesthesia Quick Evaluation

## 2021-04-10 NOTE — Anesthesia Postprocedure Evaluation (Signed)
Anesthesia Post Note  [name]  Procedure(s) Performed: RIGHT CARPAL TUNNEL RELEASE (Right: Hand) RIGHT LONG FINGER TRIGGER RELEASE (Right: Hand)     Patient location during evaluation: PACU Anesthesia Type: MAC Level of consciousness: awake and alert Pain management: pain level controlled Vital Signs Assessment: post-procedure vital signs reviewed and stable Respiratory status: spontaneous breathing and respiratory function stable Cardiovascular status: stable Postop Assessment: no apparent nausea or vomiting Anesthetic complications: no   No notable events documented.  Last Vitals:  Vitals:   04/10/21 1617 04/10/21 1632  BP: 117/62 (!) 122/56  Pulse: (!) 57 (!) 58  Resp: 17 16  Temp:    SpO2: 91% 92%    Last Pain:  Vitals:   04/10/21 1632  TempSrc:   PainSc: Asleep                 Adwoa Axe DANIEL

## 2021-04-10 NOTE — H&P (Signed)
PREOPERATIVE H&P  Chief Complaint: right carpal tunnel syndrome, right long trigger finger  HPI: Victoria Eaton is a 60 y.o. female who presents for surgical treatment of right carpal tunnel syndrome, right long trigger finger.  She denies any changes in medical history.  Past Medical History:  Diagnosis Date   Anxiety    Arthritis    "maybe in my left foot" (10/14/2017)   Bladder prolapse, female, acquired 09/14/2017   Coronary artery disease    Depression    High cholesterol    History of stomach ulcers    Hypertension    Myocardial infarction ([place]) 12/30/2004   Tobacco abuse    Type II diabetes mellitus ([place])    Past Surgical History:  Procedure Laterality Date   CARDIAC CATHETERIZATION  10/14/2017   CORONARY ANGIOPLASTY WITH STENT PLACEMENT  12/30/2004   Patient reported   CORONARY ARTERY BYPASS GRAFT N/A 10/19/2017   Procedure: CORONARY ARTERY BYPASS GRAFTING (CABG) times four using the right saphaneous vein. Harvested endoscopicly and left internal mammary artery.;  Surgeon: Grace Isaac, MD;  Location: [place];  Service: Open Heart Surgery;  Laterality: N/A;   DILATION AND CURETTAGE OF UTERUS  1980s   LEFT HEART CATH AND CORONARY ANGIOGRAPHY N/A 10/14/2017   Procedure: LEFT HEART CATH AND CORONARY ANGIOGRAPHY;  Surgeon: Martinique, Peter M, MD;  Location: [facility];  Service: Cardiovascular;  Laterality: N/A;   LEFT HEART CATH AND CORS/GRAFTS ANGIOGRAPHY N/A 01/01/2018   Procedure: LEFT HEART CATH AND CORS/GRAFTS ANGIOGRAPHY;  Surgeon: Jettie Booze, MD;  Location: [facility];  Service: Cardiovascular;  Laterality: N/A;   LEFT HEART CATH AND CORS/GRAFTS ANGIOGRAPHY N/A 08/16/2018   Procedure: LEFT HEART CATH AND CORS/GRAFTS ANGIOGRAPHY;  Surgeon: Nelva Bush, MD;  Location: [facility];  Service: Cardiovascular;  Laterality: N/A;   PILONIDAL CYST EXCISION  1980s   TEE WITHOUT CARDIOVERSION N/A 10/19/2017   Procedure: TRANSESOPHAGEAL  ECHOCARDIOGRAM (TEE);  Surgeon: Grace Isaac, MD;  Location: [facility];  Service: Open Heart Surgery;  Laterality: N/A;   Social History   Socioeconomic History   Marital status: Widowed    Spouse name: Glendell Docker   Number of children: 1   Years of education: Not on file   Highest education level: Not on file  Occupational History   Not on file  Tobacco Use   Smoking status: Some Days    Packs/day: 0.20    Years: 45.00    Pack years: 9.00    Types: Cigarettes    Start date: 05/01/1972    Last attempt to quit: 09/30/2017    Years since quitting: 3.5   Smokeless tobacco: Never  Vaping Use   Vaping Use: Never used  Substance and Sexual Activity   Alcohol use: Yes    Alcohol/week: 7.0 standard drinks    Types: 7 Standard drinks or equivalent per week   Drug use: No   Sexual activity: Not Currently  Other Topics Concern   Not on file  Social History Narrative   Not on file   Social Determinants of Health   Financial Resource Strain: Not on file  Food Insecurity: Not on file  Transportation Needs: Not on file  Physical Activity: Not on file  Stress: Not on file  Social Connections: Not on file   Family History  Problem Relation Age of Onset   Heart disease Father    Hyperlipidemia Father    Hypertension Father    Allergies  Allergen  Reactions   Januvia [Sitagliptin] Nausea And Vomiting and Other (See Comments)    Pancreatitis (this was in Epic, but patient was unaware of this)    Metformin And Related Diarrhea   Penicillins Other (See Comments)    Unknown childhood reaction Has patient had a PCN reaction causing immediate rash, facial/tongue/throat swelling, SOB or lightheadedness with hypotension: Unknown Has patient had a PCN reaction causing severe rash involving mucus membranes or skin necrosis: Unknown Has patient had a PCN reaction that required hospitalization: Unknown Has patient had a PCN reaction occurring within the last 10 years: No If all of the above  answers are "NO", then may proceed with Cephalosporin use.    Prior to Admission medications   Medication Sig Start Date End Date Taking? Authorizing Provider  amLODipine (NORVASC) 5 MG tablet TAKE ONE TABLET BY MOUTH ONE TIME DAILY 09/04/20  Yes Hilty, Nadean Corwin, MD  aspirin 325 MG tablet Take 325 mg by mouth daily.   Yes [provider]  clopidogrel (PLAVIX) 75 MG tablet Take 1 tablet (75 mg total) by mouth daily. 11/07/20  Yes Just, Laurita Quint, FNP  dapagliflozin propanediol (FARXIGA) 5 MG TABS tablet Take 1 tablet (5 mg total) by mouth daily before breakfast. 12/06/20 12/01/21 Yes Just, Laurita Quint, FNP  Dulaglutide (TRULICITY) 1.5 YI/9.4WN SOPN Inject 1.5 mg into the skin once a week. Patient taking differently: Inject 1.5 mg into the skin every Tuesday. 11/07/20  Yes Just, Laurita Quint, FNP  insulin aspart (NOVOLOG FLEXPEN) 100 UNIT/ML FlexPen Inject 5-10 Units into the skin 3 (three) times daily with meals. Sliding scale   Yes [provider]  insulin glargine, 2 Unit Dial, (TOUJEO MAX SOLOSTAR) 300 UNIT/ML Solostar Pen INJECT [place] MORNING Patient taking differently: Inject 48 Units into the skin daily. 07/27/20  Yes Just, Laurita Quint, FNP  isosorbide mononitrate (IMDUR) 120 MG 24 hr tablet Take 1 tablet (120 mg total) by mouth daily. 04/24/20  Yes Hilty, Nadean Corwin, MD  lisinopril (ZESTRIL) 5 MG tablet Take 1 tablet (5 mg total) by mouth daily. 11/07/20  Yes Just, Laurita Quint, FNP  methocarbamol (ROBAXIN) 500 MG tablet Take 1 tablet (500 mg total) by mouth 2 (two) times daily as needed. 11/07/20  Yes Just, Laurita Quint, FNP  metoprolol tartrate (LOPRESSOR) 50 MG tablet TAKE ONE TABLET BY MOUTH TWICE A DAY Patient taking differently: Take 50 mg by mouth 2 (two) times daily. 08/10/20  Yes Hilty, Nadean Corwin, MD  Multiple Vitamin (MULTIVITAMIN WITH MINERALS) TABS tablet Take 1 tablet by mouth daily.   Yes [provider]  nitroGLYCERIN (NITROSTAT) 0.4 MG SL tablet Place  1 tablet (0.4 mg total) under the tongue every 5 (five) minutes as needed for chest pain. 01/30/20 04/08/21 Yes Hilty, Nadean Corwin, MD  Omega-3 Fatty Acids (FISH OIL) 1000 MG CAPS Take 1,000 mg by mouth daily.   Yes [provider]  rosuvastatin (CRESTOR) 40 MG tablet Take 1 tablet (40 mg total) by mouth daily. 11/07/20  Yes Just, Laurita Quint, FNP  sertraline (ZOLOFT) 100 MG tablet TAKE TWO TABLETS BY MOUTH ONE TIME DAILY Patient taking differently: Take 200 mg by mouth daily. 08/02/20  Yes Just, Laurita Quint, FNP  varenicline (CHANTIX) 1 MG tablet Take 1 tablet (1 mg total) by mouth 2 (two) times daily. 12/13/20  Yes Hilty, Nadean Corwin, MD  Continuous Blood Gluc Sensor (FREESTYLE LIBRE 14 DAY SENSOR) MISC APPLY ONE SENSOR TO THE BACK OF YOUR UPPER ARM. REPLACE  EVERY 14 DAYS. 09/03/20   Horald Pollen, MD  gabapentin (NEURONTIN) 100 MG capsule TAKE ONE CAPSULE BY MOUTH THREE TIMES A DAY Patient not taking: Reported on 04/08/2021 05/04/20   Maximiano Coss, NP  Insulin Pen Needle (ULTICARE MICRO PEN NEEDLES) 32G X 4 MM MISC USE WITH INJECTIONS ONE TIME DAILY 11/07/20   Just, Laurita Quint, FNP     Positive ROS: All other systems have been reviewed and were otherwise negative with the exception of those mentioned in the HPI and as above.  Physical Exam: General: Alert, no acute distress Cardiovascular: No pedal edema Respiratory: No cyanosis, no use of accessory musculature GI: abdomen soft Skin: No lesions in the area of chief complaint Neurologic: Sensation intact distally Psychiatric: Patient is competent for consent with normal mood and affect Lymphatic: no lymphedema  MUSCULOSKELETAL: exam stable  Assessment: right carpal tunnel syndrome, right long trigger finger  Plan: Plan for Procedure(s): RIGHT CARPAL TUNNEL RELEASE RIGHT LONG FINGER TRIGGER RELEASE  The risks benefits and alternatives were discussed with the patient including but not limited to the risks of nonoperative  treatment, versus surgical intervention including infection, bleeding, nerve injury,  blood clots, cardiopulmonary complications, morbidity, mortality, among others, and they were willing to proceed.   Preoperative templating of the joint replacement has been completed, documented, and submitted to the Operating Room personnel in order to optimize intra-operative equipment management.   Eduard Roux, MD 04/10/2021 3:07 PM

## 2021-04-10 NOTE — Discharge Instructions (Signed)
   Postoperative instructions:  Dressing instructions: Keep your dressing and/or splint clean and dry at all times.  It will be removed at your first post-operative appointment.  Your stitches and/or staples will be removed at this visit.  Incision instructions:  Do not soak your incision for 3 weeks after surgery.  If the incision gets wet, pat dry and do not scrub the incision.  Pain control:  You have been given a prescription to be taken as directed for post-operative pain control.  In addition, elevate the operative extremity above the heart at all times to prevent swelling and throbbing pain.  Take over-the-counter Colace, 130m by mouth twice a day while taking narcotic pain medications to help prevent constipation.  Follow up appointments: 1) 7 days for suture removal and wound check. 2) Dr. XErlinda Hongas scheduled.   -------------------------------------------------------------------------------------------------------------  After Surgery Pain Control:  After your surgery, post-surgical discomfort or pain is likely. This discomfort can last several days to a few weeks. At certain times of the day your discomfort may be more intense.  Did you receive a nerve block?  A nerve block can provide pain relief for one hour to two days after your surgery. As long as the nerve block is working, you will experience little or no sensation in the area the surgeon operated on.  As the nerve block wears off, you will begin to experience pain or discomfort. It is very important that you begin taking your prescribed pain medication before the nerve block fully wears off. Treating your pain at the first sign of the block wearing off will ensure your pain is better controlled and more tolerable when full-sensation returns. Do not wait until the pain is intolerable, as the medicine will be less effective. It is better to treat pain in advance than to try and catch up.  General Anesthesia:  If you did not  receive a nerve block during your surgery, you will need to start taking your pain medication shortly after your surgery and should continue to do so as prescribed by your surgeon.  Pain Medication:  Most commonly we prescribe Vicodin and Percocet for post-operative pain. Both of these medications contain a combination of acetaminophen (Tylenol) and a narcotic to help control pain.   It takes between 30 and 45 minutes before pain medication starts to work. It is important to take your medication before your pain level gets too intense.   Nausea is a common side effect of many pain medications. You will want to eat something before taking your pain medicine to help prevent nausea.   If you are taking a prescription pain medication that contains acetaminophen, we recommend that you do not take additional over the counter acetaminophen (Tylenol).  Other pain relieving options:   Using a cold pack to ice the affected area a few times a day (15 to 20 minutes at a time) can help to relieve pain, reduce swelling and bruising.   Elevation of the affected area can also help to reduce pain and swelling.

## 2021-04-10 NOTE — Telephone Encounter (Signed)
Pt called back regarding preoperative clearance with recommendations sent to requesting surgeon as outlined below. Surgery is already in process today. I will remove from the pre-op pool.

## 2021-04-11 ENCOUNTER — Encounter (HOSPITAL_COMMUNITY): Payer: Self-pay | Admitting: Orthopaedic Surgery

## 2021-04-17 ENCOUNTER — Ambulatory Visit (INDEPENDENT_AMBULATORY_CARE_PROVIDER_SITE_OTHER): Payer: 59 | Admitting: Orthopaedic Surgery

## 2021-04-17 ENCOUNTER — Other Ambulatory Visit: Payer: Self-pay

## 2021-04-17 DIAGNOSIS — G5601 Carpal tunnel syndrome, right upper limb: Secondary | ICD-10-CM

## 2021-04-17 DIAGNOSIS — M65331 Trigger finger, right middle finger: Secondary | ICD-10-CM

## 2021-04-17 MED ORDER — TRAMADOL HCL 50 MG PO TABS: 50.0000 mg | ORAL_TABLET | Freq: Every day | ORAL | 0 refills | Status: DC | PRN

## 2021-04-17 NOTE — Progress Notes (Signed)
Post-Op Visit Note   [name]           Date of Birth: 05-Oct-1960           MRN: [id number] Visit Date: 04/17/2021 PCP: Martinique, Betty G, MD   Assessment & Plan:  Chief Complaint:  Chief Complaint  Patient presents with   Right Wrist - Pain, Routine Post Op   Visit Diagnoses:  1. Carpal tunnel syndrome on right   2. Trigger finger, right middle finger     Plan: [name] is 1 week status post right carpal tunnel release and right long trigger finger release.  She fell on her right hand couple days ago and has had some increased pain but overall she is doing well.  She has already started feeling relief from the surgeries.  Surgical incisions on the right hand are intact.  There is no drainage or dehiscence or any evidence of infection.  The incisions were covered with Band-Aids.  I sent a prescription for tramadol for breakthrough pain.  We will see her back next week for suture removal.  She should continue to wear the carpal tunnel splint at nighttime.  Follow-Up Instructions: Return in about 1 week (around 04/24/2021).   Orders:  No orders of the defined types were placed in this encounter.  Meds ordered this encounter  Medications   traMADol (ULTRAM) 50 MG tablet    Sig: Take 1-2 tablets (50-100 mg total) by mouth daily as needed.    Dispense:  20 tablet    Refill:  0    Imaging: No results found.  PMFS History: Patient Active Problem List   Diagnosis Date Noted   Carpal tunnel syndrome on right    Trigger finger, right middle finger    Complete uterine prolapse 03/09/2021   Non-ST elevation (NSTEMI) myocardial infarction ([place]) 02/17/2018   Type 2 diabetes mellitus with complication, with long-term current use of insulin ([place]) 02/17/2018   Chest pain 01/01/2018   Unstable angina ([facility]) 01/01/2018   Arm contusion, left, initial encounter 11/25/2017   Contusion of right knee 11/25/2017   Hx of CABG 10/19/2017   Epigastric pain    Pancreatitis  10/10/2017   Hyperlipidemia LDL goal <70 09/14/2017   Essential hypertension 05/01/2017   Major depression, recurrent ([place]) 05/01/2017   Tobacco abuse 05/01/2017   History of MI (myocardial infarction) 05/01/2017   Allergic rhinitis 05/01/2017   History of arthritis 05/01/2017   Anxiety disorder, unspecified 05/01/2017   Bladder prolapse, female, acquired 11/09/2013   Past Medical History:  Diagnosis Date   Anxiety    Arthritis    "maybe in my left foot" (10/14/2017)   Bladder prolapse, female, acquired 09/14/2017   Coronary artery disease    Depression    High cholesterol    History of stomach ulcers    Hypertension    Myocardial infarction ([place]) 12/30/2004   Tobacco abuse    Type II diabetes mellitus ([place])     Family History  Problem Relation Age of Onset   Heart disease Father    Hyperlipidemia Father    Hypertension Father     Past Surgical History:  Procedure Laterality Date   CARDIAC CATHETERIZATION  10/14/2017   CARPAL TUNNEL RELEASE Right 04/10/2021   Procedure: RIGHT CARPAL TUNNEL RELEASE;  Surgeon: Victoria Koyanagi, MD;  Location: [facility];  Service: Orthopedics;  Laterality: Right;   CORONARY ANGIOPLASTY WITH STENT PLACEMENT  12/30/2004   Patient reported   CORONARY ARTERY BYPASS GRAFT N/A  10/19/2017   Procedure: CORONARY ARTERY BYPASS GRAFTING (CABG) times four using the right saphaneous vein. Harvested endoscopicly and left internal mammary artery.;  Surgeon: Victoria Isaac, MD;  Location: [facility];  Service: Open Heart Surgery;  Laterality: N/A;   DILATION AND CURETTAGE OF UTERUS  1980s   LEFT HEART CATH AND CORONARY ANGIOGRAPHY N/A 10/14/2017   Procedure: LEFT HEART CATH AND CORONARY ANGIOGRAPHY;  Surgeon: Martinique, Peter M, MD;  Location: [facility];  Service: Cardiovascular;  Laterality: N/A;   LEFT HEART CATH AND CORS/GRAFTS ANGIOGRAPHY N/A 01/01/2018   Procedure: LEFT HEART CATH AND CORS/GRAFTS ANGIOGRAPHY;  Surgeon: Victoria Booze, MD;  Location: [facility];  Service: Cardiovascular;  Laterality: N/A;   LEFT HEART CATH AND CORS/GRAFTS ANGIOGRAPHY N/A 08/16/2018   Procedure: LEFT HEART CATH AND CORS/GRAFTS ANGIOGRAPHY;  Surgeon: Victoria Bush, MD;  Location: [facility];  Service: Cardiovascular;  Laterality: N/A;   PILONIDAL CYST EXCISION  1980s   TEE WITHOUT CARDIOVERSION N/A 10/19/2017   Procedure: TRANSESOPHAGEAL ECHOCARDIOGRAM (TEE);  Surgeon: Victoria Isaac, MD;  Location: [facility];  Service: Open Heart Surgery;  Laterality: N/A;   TRIGGER FINGER RELEASE Right 04/10/2021   Procedure: RIGHT LONG FINGER TRIGGER RELEASE;  Surgeon: Victoria Koyanagi, MD;  Location: [facility];  Service: Orthopedics;  Laterality: Right;   Social History   Occupational History   Not on file  Tobacco Use   Smoking status: Some Days    Packs/day: 0.20    Years: 45.00    Pack years: 9.00    Types: Cigarettes    Start date: 05/01/1972    Last attempt to quit: 09/30/2017    Years since quitting: 3.5   Smokeless tobacco: Never  Vaping Use   Vaping Use: Never used  Substance and Sexual Activity   Alcohol use: Yes    Alcohol/week: 7.0 standard drinks    Types: 7 Standard drinks or equivalent per week   Drug use: No   Sexual activity: Not Currently

## 2021-04-19 ENCOUNTER — Telehealth: Payer: Self-pay | Admitting: Internal Medicine

## 2021-04-19 DIAGNOSIS — F32A Depression, unspecified: Secondary | ICD-10-CM

## 2021-04-19 DIAGNOSIS — F419 Anxiety disorder, unspecified: Secondary | ICD-10-CM

## 2021-04-19 MED ORDER — ISOSORBIDE MONONITRATE ER 120 MG PO TB24: 120.0000 mg | ORAL_TABLET | Freq: Every day | ORAL | 3 refills | Status: DC

## 2021-04-19 NOTE — Telephone Encounter (Signed)
*  STAT* If patient is at the pharmacy, call can be transferred to refill team.   1. Which medications need to be refilled? (please list name of each medication and dose if known) sertraline (ZOLOFT) 100 MG tablet     isosorbide mononitrate (IMDUR) 120 MG 24 hr tablet    2. Which pharmacy/location (including street and city if local pharmacy) is medication to be sent to?  [facility] [address] [place], [place]. AT [place]    3. Do they need a 30 day or 90 day supply? 90 day supply   Pt is out of the medication

## 2021-04-23 ENCOUNTER — Other Ambulatory Visit: Payer: Self-pay

## 2021-04-25 ENCOUNTER — Encounter: Payer: Self-pay | Admitting: Orthopaedic Surgery

## 2021-04-25 ENCOUNTER — Other Ambulatory Visit: Payer: Self-pay

## 2021-04-25 ENCOUNTER — Ambulatory Visit (INDEPENDENT_AMBULATORY_CARE_PROVIDER_SITE_OTHER): Payer: 59 | Admitting: Physician Assistant

## 2021-04-25 DIAGNOSIS — M65331 Trigger finger, right middle finger: Secondary | ICD-10-CM

## 2021-04-25 DIAGNOSIS — G5601 Carpal tunnel syndrome, right upper limb: Secondary | ICD-10-CM

## 2021-04-25 NOTE — Progress Notes (Signed)
Post-Op Visit Note   [name]           Date of Birth: 09/21/1961           MRN: [id number] Visit Date: 04/25/2021 PCP: Martinique, Betty G, MD   Assessment & Plan:  Chief Complaint:  Chief Complaint  Patient presents with   Right Hand - Routine Post Op, Follow-up   Visit Diagnoses:  1. Carpal tunnel syndrome on right   2. Trigger finger, right middle finger     Plan: Patient is a 60 year old female who comes in today 2 weeks out right carpal tunnel release and right long trigger finger release, date of surgery 04/10/2021.  She has been doing well.  Minimal to no pain.  She is taking Tylenol as needed.  She still notes paresthesias to all 5 fingertips.  Examination of her right hand reveals well-healing surgical incisions with nylon sutures in place.  No evidence of infection or cellulitis.  Fingers are warm well perfused.  Today, sutures were removed and Steri-Strips applied.  No heavy lifting or submerging her hand in water for another 2 weeks.  She will continue with range of motion exercises.  Follow-up with [place] in 4 weeks time for recheck.  Call with concerns or questions.  Follow-Up Instructions: No follow-ups on file.   Orders:  No orders of the defined types were placed in this encounter.  No orders of the defined types were placed in this encounter.   Imaging: No new imaging  PMFS History: Patient Active Problem List   Diagnosis Date Noted   Carpal tunnel syndrome on right    Trigger finger, right middle finger    Complete uterine prolapse 03/09/2021   Non-ST elevation (NSTEMI) myocardial infarction ([place]) 02/17/2018   Type 2 diabetes mellitus with complication, with long-term current use of insulin ([place]) 02/17/2018   Chest pain 01/01/2018   Unstable angina ([facility]) 01/01/2018   Arm contusion, left, initial encounter 11/25/2017   Contusion of right knee 11/25/2017   Hx of CABG 10/19/2017   Epigastric pain    Pancreatitis 10/10/2017   Hyperlipidemia LDL  goal <70 09/14/2017   Essential hypertension 05/01/2017   Major depression, recurrent ([place]) 05/01/2017   Tobacco abuse 05/01/2017   History of MI (myocardial infarction) 05/01/2017   Allergic rhinitis 05/01/2017   History of arthritis 05/01/2017   Anxiety disorder, unspecified 05/01/2017   Bladder prolapse, female, acquired 11/09/2013   Past Medical History:  Diagnosis Date   Anxiety    Arthritis    "maybe in my left foot" (10/14/2017)   Bladder prolapse, female, acquired 09/14/2017   Coronary artery disease    Depression    High cholesterol    History of stomach ulcers    Hypertension    Myocardial infarction ([place]) 12/30/2004   Tobacco abuse    Type II diabetes mellitus ([place])     Family History  Problem Relation Age of Onset   Heart disease Father    Hyperlipidemia Father    Hypertension Father     Past Surgical History:  Procedure Laterality Date   CARDIAC CATHETERIZATION  10/14/2017   CARPAL TUNNEL RELEASE Right 04/10/2021   Procedure: RIGHT CARPAL TUNNEL RELEASE;  Surgeon: Leandrew Koyanagi, MD;  Location: [facility];  Service: Orthopedics;  Laterality: Right;   CORONARY ANGIOPLASTY WITH STENT PLACEMENT  12/30/2004   Patient reported   CORONARY ARTERY BYPASS GRAFT N/A 10/19/2017   Procedure: CORONARY ARTERY BYPASS GRAFTING (CABG) times four using the right saphaneous  vein. Harvested endoscopicly and left internal mammary artery.;  Surgeon: Grace Isaac, MD;  Location: [facility];  Service: Open Heart Surgery;  Laterality: N/A;   DILATION AND CURETTAGE OF UTERUS  1980s   LEFT HEART CATH AND CORONARY ANGIOGRAPHY N/A 10/14/2017   Procedure: LEFT HEART CATH AND CORONARY ANGIOGRAPHY;  Surgeon: Martinique, Peter M, MD;  Location: [facility];  Service: Cardiovascular;  Laterality: N/A;   LEFT HEART CATH AND CORS/GRAFTS ANGIOGRAPHY N/A 01/01/2018   Procedure: LEFT HEART CATH AND CORS/GRAFTS ANGIOGRAPHY;  Surgeon: Jettie Booze, MD;  Location: [facility];  Service:  Cardiovascular;  Laterality: N/A;   LEFT HEART CATH AND CORS/GRAFTS ANGIOGRAPHY N/A 08/16/2018   Procedure: LEFT HEART CATH AND CORS/GRAFTS ANGIOGRAPHY;  Surgeon: Nelva Bush, MD;  Location: [facility];  Service: Cardiovascular;  Laterality: N/A;   PILONIDAL CYST EXCISION  1980s   TEE WITHOUT CARDIOVERSION N/A 10/19/2017   Procedure: TRANSESOPHAGEAL ECHOCARDIOGRAM (TEE);  Surgeon: Grace Isaac, MD;  Location: [facility];  Service: Open Heart Surgery;  Laterality: N/A;   TRIGGER FINGER RELEASE Right 04/10/2021   Procedure: RIGHT LONG FINGER TRIGGER RELEASE;  Surgeon: Leandrew Koyanagi, MD;  Location: [facility];  Service: Orthopedics;  Laterality: Right;   Social History   Occupational History   Not on file  Tobacco Use   Smoking status: Some Days    Packs/day: 0.20    Years: 45.00    Pack years: 9.00    Types: Cigarettes    Start date: 05/01/1972    Last attempt to quit: 09/30/2017    Years since quitting: 3.5   Smokeless tobacco: Never  Vaping Use   Vaping Use: Never used  Substance and Sexual Activity   Alcohol use: Yes    Alcohol/week: 7.0 standard drinks    Types: 7 Standard drinks or equivalent per week   Drug use: No   Sexual activity: Not Currently

## 2021-04-29 ENCOUNTER — Encounter: Payer: Self-pay | Admitting: Family Medicine

## 2021-04-29 ENCOUNTER — Telehealth (INDEPENDENT_AMBULATORY_CARE_PROVIDER_SITE_OTHER): Payer: 59 | Admitting: Family Medicine

## 2021-04-29 VITALS — Ht 66.0 in

## 2021-04-29 DIAGNOSIS — Z794 Long term (current) use of insulin: Secondary | ICD-10-CM

## 2021-04-29 DIAGNOSIS — E118 Type 2 diabetes mellitus with unspecified complications: Secondary | ICD-10-CM

## 2021-04-29 DIAGNOSIS — F3341 Major depressive disorder, recurrent, in partial remission: Secondary | ICD-10-CM

## 2021-04-29 DIAGNOSIS — Z720 Tobacco use: Secondary | ICD-10-CM | POA: Diagnosis not present

## 2021-04-29 MED ORDER — VARENICLINE TARTRATE 1 MG PO TABS: 1.0000 mg | ORAL_TABLET | Freq: Two times a day (BID) | ORAL | 1 refills | Status: AC

## 2021-04-29 MED ORDER — TOUJEO MAX SOLOSTAR 300 UNIT/ML ~~LOC~~ SOPN: 48.0000 [IU] | PEN_INJECTOR | Freq: Every day | SUBCUTANEOUS | 2 refills | Status: DC

## 2021-04-29 MED ORDER — BUPROPION HCL ER (XL) 300 MG PO TB24: 300.0000 mg | ORAL_TABLET | Freq: Every day | ORAL | 0 refills | Status: DC

## 2021-04-29 NOTE — Progress Notes (Signed)
MyChart Video Visit  Virtual Visit via Video Note   This visit type was conducted due to national recommendations for restrictions regarding the COVID-19 Pandemic (e.g. social distancing) in an effort to limit this patient's exposure and mitigate transmission in our community. This patient is at least at moderate risk for complications without adequate follow up. This format is felt to be most appropriate for this patient at this time. Physical exam was limited by quality of the video and audio technology used for the visit.   Patient location: home Provider location: office  I discussed the limitations of evaluation and management by telemedicine and the availability of in person appointments. The patient expressed understanding and agreed to proceed.  [name]   DOB: 1960/10/13   60 y.o. Female  MRN: [id number] Visit Date: 04/29/2021  Today's healthcare provider: Nevia Henkin Martinique, MD   Chief Complaint  Patient presents with   glucose levels   Medication Refill    chantix   HPI     Medication Refill    Additional comments: chantix      Last edited by Rodrigo Ran, CMA on 04/29/2021  3:44 PM.     Victoria Eaton is a 60 y.o.female with hx of CAD, depression, hyperlipidemia, and hypertension complaining of elevated BS. DM2 on Toujeo 44 units daily and Trulicity 1.5 mg weekly. Fasting BS 170s. She has had some in the 200s and occasionally 300s in the afternoon, mainly weekends. She acknowledges she has not been consistent with following dietary recommendations. She has not met with diabetes educator in years.     Lab Results  Component Value Date   HGBA1C 7.6 (A) 03/12/2021  Negative for abdominal pain, nausea,vomiting, polydipsia,polyuria, or polyphagia.  Depression and anxiety: Exacerbated by deaths in family. She follows with a counselor weekly. Currently she is on sertraline 200 mg daily. She has not been on Wellbutrin XL, states that I discontinued  medication last visit.  She reported being on Wellbutrin XL 600 mg daily, I could not find documentation when this dose was recommended, she was supposed to be on Wellbutrin XL 300 mg. Upon further questioning, she states that her depression is worse since Wellbutrin was discontinued. Wellbutrin was also helping with smoking cessation. She is also requesting refills for Chantix, which has helped with decreasing smoking. Currently she is smoking 8 cigarettes/day. She denies side effects with Chantix.  Patient Active Problem List   Diagnosis Date Noted   Carpal tunnel syndrome on right    Trigger finger, right middle finger    Complete uterine prolapse 03/09/2021   Non-ST elevation (NSTEMI) myocardial infarction ([place]) 02/17/2018   Type 2 diabetes mellitus with complication, with long-term current use of insulin ([place]) 02/17/2018   Chest pain 01/01/2018   Unstable angina ([place]) 01/01/2018   Arm contusion, left, initial encounter 11/25/2017   Contusion of right knee 11/25/2017   Hx of CABG 10/19/2017   Epigastric pain    Pancreatitis 10/10/2017   Hyperlipidemia LDL goal <70 09/14/2017   Essential hypertension 05/01/2017   Major depression, recurrent ([place]) 05/01/2017   Tobacco abuse 05/01/2017   History of MI (myocardial infarction) 05/01/2017   Allergic rhinitis 05/01/2017   History of arthritis 05/01/2017   Anxiety disorder, unspecified 05/01/2017   Bladder prolapse, female, acquired 11/09/2013   Past Medical History:  Diagnosis Date   Anxiety    Arthritis    "maybe in my left foot" (10/14/2017)   Bladder prolapse, female, acquired 09/14/2017   Coronary artery  disease    Depression    High cholesterol    History of stomach ulcers    Hypertension    Myocardial infarction ([place]) 12/30/2004   Tobacco abuse    Type II diabetes mellitus ([place])    Social History   Tobacco Use   Smoking status: Some Days    Packs/day: 0.20    Years: 45.00    Pack years: 9.00    Types: Cigarettes     Start date: 05/01/1972    Last attempt to quit: 09/30/2017    Years since quitting: 3.5   Smokeless tobacco: Never  Vaping Use   Vaping Use: Never used  Substance Use Topics   Alcohol use: Yes    Alcohol/week: 7.0 standard drinks    Types: 7 Standard drinks or equivalent per week   Drug use: No   Allergies  Allergen Reactions   Januvia [Sitagliptin] Nausea And Vomiting and Other (See Comments)    Pancreatitis (this was in Epic, but patient was unaware of this)    Metformin And Related Diarrhea   Penicillins Other (See Comments)    Unknown childhood reaction Has patient had a PCN reaction causing immediate rash, facial/tongue/throat swelling, SOB or lightheadedness with hypotension: Unknown Has patient had a PCN reaction causing severe rash involving mucus membranes or skin necrosis: Unknown Has patient had a PCN reaction that required hospitalization: Unknown Has patient had a PCN reaction occurring within the last 10 years: No If all of the above answers are "NO", then may proceed with Cephalosporin use.    Medications: Outpatient Medications Prior to Visit  Medication Sig   amLODipine (NORVASC) 5 MG tablet TAKE ONE TABLET BY MOUTH ONE TIME DAILY   aspirin 325 MG tablet Take 325 mg by mouth daily.   clopidogrel (PLAVIX) 75 MG tablet Take 1 tablet (75 mg total) by mouth daily.   Continuous Blood Gluc Sensor (FREESTYLE LIBRE 14 DAY SENSOR) MISC APPLY ONE SENSOR TO THE BACK OF YOUR UPPER ARM. REPLACE EVERY 14 DAYS.   dapagliflozin propanediol (FARXIGA) 5 MG TABS tablet Take 1 tablet (5 mg total) by mouth daily before breakfast.   Dulaglutide (TRULICITY) 1.5 ZW/2.5EN SOPN Inject 1.5 mg into the skin once a week. (Patient taking differently: Inject 1.5 mg into the skin every Tuesday.)   insulin aspart (NOVOLOG FLEXPEN) 100 UNIT/ML FlexPen Inject 5-10 Units into the skin 3 (three) times daily with meals. Sliding scale   Insulin Pen Needle (ULTICARE MICRO PEN NEEDLES) 32G X 4 MM MISC  USE WITH INJECTIONS ONE TIME DAILY   isosorbide mononitrate (IMDUR) 120 MG 24 hr tablet Take 1 tablet (120 mg total) by mouth daily.   lisinopril (ZESTRIL) 5 MG tablet Take 1 tablet (5 mg total) by mouth daily.   methocarbamol (ROBAXIN) 500 MG tablet Take 1 tablet (500 mg total) by mouth 2 (two) times daily as needed.   metoprolol tartrate (LOPRESSOR) 50 MG tablet TAKE ONE TABLET BY MOUTH TWICE A DAY (Patient taking differently: Take 50 mg by mouth 2 (two) times daily.)   Multiple Vitamin (MULTIVITAMIN WITH MINERALS) TABS tablet Take 1 tablet by mouth daily.   Omega-3 Fatty Acids (FISH OIL) 1000 MG CAPS Take 1,000 mg by mouth daily.   oxyCODONE-acetaminophen (PERCOCET) 5-325 MG tablet Take 1-2 tablets by mouth every 8 (eight) hours as needed for severe pain.   rosuvastatin (CRESTOR) 40 MG tablet Take 1 tablet (40 mg total) by mouth daily.   sertraline (ZOLOFT) 100 MG tablet TAKE TWO TABLETS BY MOUTH  ONE TIME DAILY (Patient taking differently: Take 200 mg by mouth daily.)   traMADol (ULTRAM) 50 MG tablet Take 1-2 tablets (50-100 mg total) by mouth daily as needed.   [DISCONTINUED] insulin glargine, 2 Unit Dial, (TOUJEO MAX SOLOSTAR) 300 UNIT/ML Solostar Pen INJECT 55 UNITS UNDER THE SKIN EVERY MORNING (Patient taking differently: Inject 48 Units into the skin daily.)   [DISCONTINUED] varenicline (CHANTIX) 1 MG tablet Take 1 tablet (1 mg total) by mouth 2 (two) times daily.   nitroGLYCERIN (NITROSTAT) 0.4 MG SL tablet Place 1 tablet (0.4 mg total) under the tongue every 5 (five) minutes as needed for chest pain.   No facility-administered medications prior to visit.   Review of Systems  Constitutional:  Positive for fatigue. Negative for activity change, appetite change, chills and fever.  Respiratory:  Negative for cough, shortness of breath and wheezing.   Cardiovascular:  Negative for chest pain and leg swelling.  Neurological:  Negative for syncope, facial asymmetry and weakness.   Psychiatric/Behavioral:  Negative for confusion. The patient is nervous/anxious.   See pertinent positives and negatives per HPI.  Objective    Ht _0  (1.676 m)   BMI 31.44 kg/m   GENERAL: alert, oriented, appears well and in no acute distress  HEENT: atraumatic, conjunctiva clear, no obvious abnormalities on inspection.  NECK: normal movements of the head and neck  LUNGS: on inspection no signs of respiratory distress, breathing rate appears normal, no obvious gross SOB, gasping or wheezing  CV: no obvious cyanosis  MS: moves all visible extremities without noticeable abnormality  PSYCH/NEURO: pleasant and cooperative, no obvious depression, +anxiety, speech and thought processing grossly intact   Assessment & Plan    Claudett Bayly had a virtual visit today (video/telephone)  Diagnoses and all orders for this visit:  1. Type 2 diabetes mellitus with complication, with long-term current use of insulin ([place]) HgA1C has not been at goal and BS have been elevated. Toujeo increased from 44 to 48 U and she can increase it by 4 U in a week  if BS's still elevated, > 180. No changes in Trulicity.  Healthful diet with avoidance of added sugar food intake encouraged, nutrition referral placed.  - insulin glargine, 2 Unit Dial, (TOUJEO MAX SOLOSTAR) 300 UNIT/ML Solostar Pen; Inject 48 Units into the skin daily.  Dispense: 9 mL; Refill: 2 - Amb Referral to Nutrition and Diabetic Education  2. Recurrent major depressive disorder, in partial remission ([place]) Problem is not well controlled. Wellbutrin XL 300 mg to resume. No changes in Sertraline dose. Consider establishing with psychiatrist if adding Wellbutrin back does not help. Instructed about warning signs.  - buPROPion (WELLBUTRIN XL) 300 MG 24 hr tablet; Take 1 tablet (300 mg total) by mouth daily.  Dispense: 90 tablet; Refill: 0  3. Tobacco abuse She has tolerated Chantix well in the past. She understands adverse  effects of tobacco use and benefits of smoking cessation. 11 weeks of Chantix treatment.  - varenicline (CHANTIX) 1 MG tablet; Take 1 tablet (1 mg total) by mouth 2 (two) times daily.  Dispense: 70 tablet; Refill: 1  Return if symptoms worsen or fail to improve, for Keep next appt..    I discussed the assessment and treatment plan with the patient. The patient was provided an opportunity to ask questions and all were answered. The patient agreed with the plan and demonstrated an understanding of the instructions.    Charrisse Masley Martinique, MD [place] (phone) [phone] (fax)  [place]

## 2021-05-02 ENCOUNTER — Other Ambulatory Visit: Payer: Self-pay

## 2021-05-02 ENCOUNTER — Encounter: Payer: Self-pay | Admitting: Obstetrics and Gynecology

## 2021-05-02 ENCOUNTER — Ambulatory Visit (INDEPENDENT_AMBULATORY_CARE_PROVIDER_SITE_OTHER): Payer: 59 | Admitting: Obstetrics and Gynecology

## 2021-05-02 VITALS — BP 144/80 | HR 74 | Ht 66.0 in | Wt 194.0 lb

## 2021-05-02 DIAGNOSIS — R350 Frequency of micturition: Secondary | ICD-10-CM | POA: Diagnosis not present

## 2021-05-02 LAB — POCT URINALYSIS DIPSTICK
Appearance: NORMAL
Bilirubin, UA: NEGATIVE
Blood, UA: NEGATIVE
Glucose, UA: POSITIVE — AB
Ketones, UA: NEGATIVE
Nitrite, UA: NEGATIVE
Protein, UA: POSITIVE — AB
Spec Grav, UA: 1.025 (ref 1.010–1.025)
Urobilinogen, UA: 0.2 E.U./dL
pH, UA: 5.5 (ref 5.0–8.0)

## 2021-05-02 NOTE — Progress Notes (Signed)
[facility]ne[facility] Urodynamics Procedure  Referring Physician: No ref. provider found Date of Procedure: 05/02/2021  Victoria Eaton is a 60 y.o. female who presents for urodynamic evaluation. Indication(s) for study: SUI  Vital Signs: BP (!) 144/80   Pulse 74   Ht 5' 6"  (1.676 m)   Wt 194 lb (88 kg)   BMI 31.31 kg/m   Laboratory Results: A catheterized urine specimen revealed:  POC urine: + glucose and protein, trace leukocytes   Voiding Diary: Not performed  Procedure Timeout:  The correct patient was verified and the correct procedure was verified. The patient was in the correct position and safety precautions were reviewed based on at the patient's history.  Urodynamic Procedure A 53F dual lumen urodynamics catheter was placed under sterile conditions into the patient's bladder. A 53F catheter was placed into the rectum in order to measure abdominal pressure. EMG patches were placed in the appropriate position.  All connections were confirmed and calibrations/adjusted made. A ring pessary was in place throughout the procedure. Saline was instilled into the bladder through the dual lumen catheters.  Cough/valsalva pressures were measured periodically during filling.  Patient was allowed to void.  The bladder was then emptied of its residual.  UROFLOW: Revealed a Qmax of 5.3 mL/sec.  She voided 41 mL and had a residual of 15 mL.  It was a normal pattern and represented normal habits though interpretation limited due to low voided volume.  CMG: This was performed with sterile water in the sitting position at a fill rate of 30 mL/min.    First sensation of fullness was 50 mLs,  First urge was 169 mLs,  Strong urge was 237 mLs and  Capacity was 297 mLs  Stress incontinence was demonstrated Highest negative Barrier CLPP was 142 cmH20 at 200 ml. Lowest positive Barrier VLPP was 62 cmH20 at 343 ml.  Detrusor function was normal, with no phasic contractions seen.     Compliance:  normal. End fill detrusor pressure was -4cmH20.  Calculated compliance was 126 [id number]/[id number]  UPP: MUCP with/ without barrier reduction was 71 cm of water.    MICTURITION STUDY: Voiding was performed with reduction using pessary in the sitting position.  Pdet at Qmax was 24 cm of water.  Qmax was 16 mL/sec.  It was a normal pattern.  She voided 292 mL and had a residual of 5 mL.  It was a volitional void, sustained detrusor contraction was present and abdominal straining was present  EMG: This was performed with patches.  She had voluntary contractions, recruitment with fill was present and urethral sphincter was relaxed with void.  The details of the procedure with the study tracings have been scanned into EPIC.   Urodynamic Impression:  1. Sensation was normal; capacity was normal 2. Stress Incontinence was demonstrated at normal pressures; 3. Detrusor Overactivity was not demonstrated. 4. Emptying was normal with a normal PVR, a sustained detrusor contraction present,  abdominal straining present, normal urethral sphincter activity on EMG.  Plan: - The patient will follow up  to discuss the findings and treatment options.    [name], MD

## 2021-05-02 NOTE — Patient Instructions (Addendum)
Taking Care of Yourself after Urodynamics, Cystoscopy, Coaptite Injection, or Botox Injection   Drink plenty of water for a day or two following your procedure. Try to have about 8 ounces (one cup) at a time, and do this 6 times or more per day unless you have fluid restrictitons AVOID irritative beverages such as coffee, tea, soda, alcoholic or citrus drinks for a day or two, as this may cause burning with urination.  For the first 1-2 days after the procedure, your urine may be pink or red in color. You may have some blood in your urine as a normal side effect of the procedure. Large amounts of bleeding or difficulty urinating are NOT normal. Call the nurse line if this happens or go to the nearest Emergency Room if the bleeding is heavy or you cannot urinate at all and it is after hours. If you had a Coaptite injection in the urethra and need to be catheterized afterward, ask for a pediatric catheter to be used (size 10 or 12-French) so the Coaptite material is not pushed out of place.   You may experience some discomfort or a burning sensation with urination after having this procedure. You can use over the counter Azo or pyridium to help with burning and follow the instructions on the packaging. If it does not improve within 1-2 days, or other symptoms appear (fever, chills, or difficulty urinating) call the office to speak to a nurse.  You may return to normal daily activities such as work, school, driving, exercising and housework on the day of the procedure. If your doctor gave you a prescription, take it as ordered.  If you need a return appointment, the front desk staff will arrange it when you check out.

## 2021-05-13 ENCOUNTER — Encounter: Payer: Self-pay | Admitting: Family Medicine

## 2021-05-17 ENCOUNTER — Other Ambulatory Visit: Payer: Self-pay | Admitting: Family Medicine

## 2021-05-17 DIAGNOSIS — F32A Depression, unspecified: Secondary | ICD-10-CM

## 2021-05-17 DIAGNOSIS — F419 Anxiety disorder, unspecified: Secondary | ICD-10-CM

## 2021-05-20 ENCOUNTER — Encounter: Payer: Self-pay | Admitting: Family Medicine

## 2021-05-20 DIAGNOSIS — F32A Depression, unspecified: Secondary | ICD-10-CM

## 2021-05-20 DIAGNOSIS — F419 Anxiety disorder, unspecified: Secondary | ICD-10-CM

## 2021-05-20 MED ORDER — SERTRALINE HCL 100 MG PO TABS: ORAL_TABLET | ORAL | 0 refills | Status: DC

## 2021-05-20 NOTE — Progress Notes (Signed)
[facility] Return Visit  SUBJECTIVE  History of Present Illness: Victoria Eaton is a 60 y.o. female seen in follow-up after Urodynamic testing. She is interested in surgery.   Urodynamic Impression:  1. Sensation was normal; capacity was normal 2. Stress Incontinence was demonstrated at normal pressures; 3. Detrusor Overactivity was not demonstrated. 4. Emptying was normal with a normal PVR, a sustained detrusor contraction present,  abdominal straining present, normal urethral sphincter activity on EMG.   Past Medical History: Patient  has a past medical history of Anxiety, Arthritis, Bladder prolapse, female, acquired (09/14/2017), Coronary artery disease, Depression, High cholesterol, History of stomach ulcers, Hypertension, Myocardial infarction ([place]) (12/30/2004), Tobacco abuse, and Type II diabetes mellitus ([place]).   Past Surgical History: She  has a past surgical history that includes LEFT HEART CATH AND CORONARY ANGIOGRAPHY (N/A, 10/14/2017); Pilonidal cyst excision (1980s); Dilation and curettage of uterus (1980s); Coronary angioplasty with stent (12/30/2004); Cardiac catheterization (10/14/2017); Coronary artery bypass graft (N/A, 10/19/2017); TEE without cardioversion (N/A, 10/19/2017); LEFT HEART CATH AND CORS/GRAFTS ANGIOGRAPHY (N/A, 01/01/2018); LEFT HEART CATH AND CORS/GRAFTS ANGIOGRAPHY (N/A, 08/16/2018); Carpal tunnel release (Right, 04/10/2021); and Trigger finger release (Right, 04/10/2021).   Medications: She has a current medication list which includes the following prescription(s): amlodipine, aspirin, bupropion, clopidogrel, freestyle libre 14 day sensor, dapagliflozin propanediol, trulicity, novolog flexpen, toujeo max solostar, ulticare micro pen needles, isosorbide mononitrate, lisinopril, methocarbamol, metoprolol tartrate, multivitamin with minerals, fish oil, oxycodone-acetaminophen, rosuvastatin, sertraline, tramadol, varenicline, and nitroglycerin.    Allergies: Patient is allergic to [place] [sitagliptin], metformin and related, and penicillins.   Social History: Patient  reports that she has been smoking cigarettes. She started smoking about 49 years ago. She has a 9.00 pack-year smoking history. She has never used smokeless tobacco. She reports current alcohol use of about 7.0 standard drinks per week. She reports that she does not use drugs.      OBJECTIVE     Physical Exam: Vitals:   05/21/21 1515  BP: 138/72  Pulse: 77  Weight: 196 lb (88.9 kg)   Gen: No apparent distress, A&O x 3.  Detailed Urogynecologic Evaluation:  Deferred. Prior exam showed:  POP-Q (12/31/20):    POP-Q   3                                            Aa   7                                           Ba   8                                              C    6                                            Gh   2.5  Pb   8                                            tvl    3                                            Ap   5                                            Bp   -4                                              D        ASSESSMENT AND PLAN    Victoria Eaton is a 60 y.o. with:  1. Uterovaginal prolapse, complete   2. Prolapse of anterior vaginal wall   3. Prolapse of posterior vaginal wall   4. SUI (stress urinary incontinence, female)      Plan for surgery: Exam under anesthesia, total vaginal hysterectomy, uterosacral ligament suspension, anterior and posterior repair with perineorrhaphy, urethral bulking, cystoscopy   - We reviewed the patient's specific anatomic and functional findings, with the assistance of diagrams, and together finalized the above procedure. The planned surgical procedures were discussed along with the surgical risks outlined below, which were also provided on a detailed handout. Additional treatment options including expectant management, conservative  management, medical management were discussed where appropriate.  We reviewed the benefits and risks of each treatment option.   General Surgical Risks: For all procedures, there are risks of bleeding, infection, damage to surrounding organs including but not limited to bowel, bladder, blood vessels, ureters and nerves, and need for further surgery if an injury were to occur. These risks are all low with minimally invasive surgery.   There are risks of numbness and weakness at any body site or buttock/rectal pain.  It is possible that baseline pain can be worsened by surgery, either with or without mesh. If surgery is vaginal, there is also a low risk of possible conversion to laparoscopy or open abdominal incision where indicated. Very rare risks include blood transfusion, blood clot, heart attack, pneumonia, or death.   There is also a risk of short-term postoperative urinary retention with need to use a catheter. About half of patients need to go home from surgery with a catheter, which is then later removed in the office. The risk of long-term need for a catheter is very low. There is also a risk of worsening of overactive bladder.   Prolapse (with or without mesh): Risk factors for surgical failure  include things that put pressure on your pelvis and the surgical repair, including obesity, chronic cough, and heavy lifting or straining (including lifting children or adults, straining on the toilet, or lifting heavy objects such as furniture or anything weighing >25 lbs. Risks of recurrence is 20-30% with vaginal native tissue repair and a less than 10% with sacrocolpopexy with mesh.    - For preop Visit:  She is required to  have a visit within 30 days of her surgery.    - Medical clearance: required Letter sent to Cardiologist requesting risk stratification and medical optimization as well as recommendations about perioperative plavix. She will also need a recent A1c < 8- pt will find out if she  is getting blood work from PCP.  - Anticoagulant use: Yes- takes plavix - Medicaid Hysterectomy form: No - Accepts blood transfusion: Yes - Expected length of stay: outpatient  Request sent for surgery scheduling.   [name], MD  Time spent: I spent 40 minutes dedicated to the care of this patient on the date of this encounter to include pre-visit review of records, face-to-face time with the patient discussing surgery and post visit documentation and communication with other providers.

## 2021-05-21 ENCOUNTER — Ambulatory Visit (INDEPENDENT_AMBULATORY_CARE_PROVIDER_SITE_OTHER): Payer: 59 | Admitting: Obstetrics and Gynecology

## 2021-05-21 ENCOUNTER — Other Ambulatory Visit: Payer: Self-pay

## 2021-05-21 ENCOUNTER — Encounter: Payer: Self-pay | Admitting: Obstetrics and Gynecology

## 2021-05-21 VITALS — BP 138/72 | HR 77 | Wt 196.0 lb

## 2021-05-21 DIAGNOSIS — N813 Complete uterovaginal prolapse: Secondary | ICD-10-CM | POA: Diagnosis not present

## 2021-05-21 DIAGNOSIS — N393 Stress incontinence (female) (male): Secondary | ICD-10-CM | POA: Diagnosis not present

## 2021-05-21 DIAGNOSIS — N816 Rectocele: Secondary | ICD-10-CM | POA: Diagnosis not present

## 2021-05-21 DIAGNOSIS — N8110 Cystocele, unspecified: Secondary | ICD-10-CM

## 2021-05-23 ENCOUNTER — Encounter: Payer: Self-pay | Admitting: Family Medicine

## 2021-05-23 ENCOUNTER — Encounter: Payer: 59 | Admitting: Orthopaedic Surgery

## 2021-05-23 NOTE — Progress Notes (Signed)
Ok to hold Plavix 5 days prior to procedure - restart after when safe from a bleeding standpoint.  Dr .Debara Pickett

## 2021-05-27 ENCOUNTER — Other Ambulatory Visit: Payer: Self-pay | Admitting: Internal Medicine

## 2021-06-04 ENCOUNTER — Encounter: Payer: Self-pay | Admitting: Orthopaedic Surgery

## 2021-06-04 ENCOUNTER — Other Ambulatory Visit: Payer: Self-pay

## 2021-06-04 ENCOUNTER — Ambulatory Visit (INDEPENDENT_AMBULATORY_CARE_PROVIDER_SITE_OTHER): Payer: 59 | Admitting: Physician Assistant

## 2021-06-04 DIAGNOSIS — M65331 Trigger finger, right middle finger: Secondary | ICD-10-CM

## 2021-06-04 DIAGNOSIS — G5601 Carpal tunnel syndrome, right upper limb: Secondary | ICD-10-CM

## 2021-06-04 MED ORDER — PREDNISONE 5 MG (21) PO TBPK: ORAL_TABLET | ORAL | 0 refills | Status: DC

## 2021-06-04 NOTE — Progress Notes (Signed)
Post-Op Visit Note   [name]           Date of Birth: [birth year]           MRN: [id number] Visit Date: 06/04/2021 PCP: Martinique, Betty G, MD   Assessment & Plan:  Chief Complaint:  Chief Complaint  Patient presents with   Right Hand - Pain   Visit Diagnoses:  1. Trigger finger, right middle finger   2. Carpal tunnel syndrome on right     Plan: Patient is a pleasant 60 year old female comes in today approximately 2 months status post right carpal tunnel release and right long trigger finger release, date of surgery 04/10/2021.  Patient did have severe carpal tunnel syndrome noted on nerve conduction study prior to surgical intervention.  She has been doing okay.  She still notes some pain throughout the hand in addition to paresthesias to the fingertips of the index and long fingers.  Examination of her right hand reveals to fully healed surgical scars without complication.  No triggering to the long finger.  She is neurovascular intact distally.  At this point, we have discussed that due to the severity of compression prior to surgery that her symptoms can take a long time to improve and may not completely resolve.  We have also discussed starting her on a low-dose steroid for which she would like to try.  She will follow-up with [place] as needed.  Call with concerns or questions in the meantime.  Follow-Up Instructions: Return if symptoms worsen or fail to improve.   Orders:  No orders of the defined types were placed in this encounter.  Meds ordered this encounter  Medications   predniSONE (STERAPRED UNI-PAK 21 TAB) 5 MG (21) TBPK tablet    Sig: Take as directed    Dispense:  21 tablet    Refill:  0    Imaging: No new imaging  PMFS History: Patient Active Problem List   Diagnosis Date Noted   Carpal tunnel syndrome on right    Trigger finger, right middle finger    Complete uterine prolapse 03/09/2021   Non-ST elevation (NSTEMI) myocardial infarction ([place])  02/17/2018   Type 2 diabetes mellitus with complication, with long-term current use of insulin ([place]) 02/17/2018   Chest pain 01/01/2018   Unstable angina (HCC) 01/01/2018   Arm contusion, left, initial encounter 11/25/2017   Contusion of right knee 11/25/2017   Hx of CABG 10/19/2017   Epigastric pain    Pancreatitis 10/10/2017   Hyperlipidemia LDL goal <70 09/14/2017   Essential hypertension 05/01/2017   Major depression, recurrent ([place]) 05/01/2017   Tobacco abuse 05/01/2017   History of MI (myocardial infarction) 05/01/2017   Allergic rhinitis 05/01/2017   History of arthritis 05/01/2017   Anxiety disorder, unspecified 05/01/2017   Bladder prolapse, female, acquired 11/09/2013   Past Medical History:  Diagnosis Date   Anxiety    Arthritis    "maybe in my left foot" (10/14/2017)   Bladder prolapse, female, acquired 09/14/2017   Coronary artery disease    Depression    High cholesterol    History of stomach ulcers    Hypertension    Myocardial infarction ([place]) 12/30/2004   Tobacco abuse    Type II diabetes mellitus ([place])     Family History  Problem Relation Age of Onset   Heart disease Father    Hyperlipidemia Father    Hypertension Father     Past Surgical History:  Procedure Laterality Date   CARDIAC  CATHETERIZATION  10/14/2017   CARPAL TUNNEL RELEASE Right 04/10/2021   Procedure: RIGHT CARPAL TUNNEL RELEASE;  Surgeon: Leandrew Koyanagi, MD;  Location: [place];  Service: Orthopedics;  Laterality: Right;   CORONARY ANGIOPLASTY WITH STENT PLACEMENT  12/30/2004   Patient reported   CORONARY ARTERY BYPASS GRAFT N/A 10/19/2017   Procedure: CORONARY ARTERY BYPASS GRAFTING (CABG) times four using the right saphaneous vein. Harvested endoscopicly and left internal mammary artery.;  Surgeon: Grace Isaac, MD;  Location: [place];  Service: Open Heart Surgery;  Laterality: N/A;   DILATION AND CURETTAGE OF UTERUS  1980s   LEFT HEART CATH AND CORONARY ANGIOGRAPHY N/A 10/14/2017    Procedure: LEFT HEART CATH AND CORONARY ANGIOGRAPHY;  Surgeon: Martinique, Peter M, MD;  Location: [place] [facility];  Service: Cardiovascular;  Laterality: N/A;   LEFT HEART CATH AND CORS/GRAFTS ANGIOGRAPHY N/A 01/01/2018   Procedure: LEFT HEART CATH AND CORS/GRAFTS ANGIOGRAPHY;  Surgeon: Jettie Booze, MD;  Location: [place] [facility];  Service: Cardiovascular;  Laterality: N/A;   LEFT HEART CATH AND CORS/GRAFTS ANGIOGRAPHY N/A 08/16/2018   Procedure: LEFT HEART CATH AND CORS/GRAFTS ANGIOGRAPHY;  Surgeon: Nelva Bush, MD;  Location: [place] [facility];  Service: Cardiovascular;  Laterality: N/A;   PILONIDAL CYST EXCISION  1980s   TEE WITHOUT CARDIOVERSION N/A 10/19/2017   Procedure: TRANSESOPHAGEAL ECHOCARDIOGRAM (TEE);  Surgeon: Grace Isaac, MD;  Location: [place];  Service: Open Heart Surgery;  Laterality: N/A;   TRIGGER FINGER RELEASE Right 04/10/2021   Procedure: RIGHT LONG FINGER TRIGGER RELEASE;  Surgeon: Leandrew Koyanagi, MD;  Location: [place];  Service: Orthopedics;  Laterality: Right;   Social History   Occupational History   Not on file  Tobacco Use   Smoking status: Some Days    Packs/day: 0.20    Years: 45.00    Pack years: 9.00    Types: Cigarettes    Start date: 05/01/1972    Last attempt to quit: 09/30/2017    Years since quitting: 3.6   Smokeless tobacco: Never  Vaping Use   Vaping Use: Never used  Substance and Sexual Activity   Alcohol use: Yes    Alcohol/week: 7.0 standard drinks    Types: 7 Standard drinks or equivalent per week   Drug use: No   Sexual activity: Not Currently

## 2021-06-06 ENCOUNTER — Other Ambulatory Visit: Payer: Self-pay

## 2021-06-07 ENCOUNTER — Other Ambulatory Visit (INDEPENDENT_AMBULATORY_CARE_PROVIDER_SITE_OTHER): Payer: 59

## 2021-06-07 ENCOUNTER — Encounter: Payer: Self-pay | Admitting: Family Medicine

## 2021-06-07 ENCOUNTER — Ambulatory Visit (INDEPENDENT_AMBULATORY_CARE_PROVIDER_SITE_OTHER)
Admission: RE | Admit: 2021-06-07 | Discharge: 2021-06-07 | Disposition: A | Discharge status: Home / Self Care | Payer: 59 | Source: Ambulatory Visit | Attending: Family Medicine | Admitting: Family Medicine

## 2021-06-07 ENCOUNTER — Ambulatory Visit (INDEPENDENT_AMBULATORY_CARE_PROVIDER_SITE_OTHER): Payer: 59 | Admitting: Family Medicine

## 2021-06-07 VITALS — BP 128/80 | HR 72 | Resp 16 | Ht 66.0 in | Wt 193.0 lb

## 2021-06-07 DIAGNOSIS — D509 Iron deficiency anemia, unspecified: Secondary | ICD-10-CM

## 2021-06-07 DIAGNOSIS — I10 Essential (primary) hypertension: Secondary | ICD-10-CM

## 2021-06-07 DIAGNOSIS — E118 Type 2 diabetes mellitus with unspecified complications: Secondary | ICD-10-CM

## 2021-06-07 DIAGNOSIS — Z01818 Encounter for other preprocedural examination: Secondary | ICD-10-CM

## 2021-06-07 DIAGNOSIS — R062 Wheezing: Secondary | ICD-10-CM

## 2021-06-07 DIAGNOSIS — Z794 Long term (current) use of insulin: Secondary | ICD-10-CM | POA: Diagnosis not present

## 2021-06-07 DIAGNOSIS — M25551 Pain in right hip: Secondary | ICD-10-CM

## 2021-06-07 LAB — IRON: Iron: 30 ug/dL — ABNORMAL LOW (ref 42–145)

## 2021-06-07 LAB — POCT GLYCOSYLATED HEMOGLOBIN (HGB A1C): Hemoglobin A1C: 7.1 % — AB (ref 4.0–5.6)

## 2021-06-07 LAB — FERRITIN: Ferritin: 10.5 ng/mL (ref 10.0–291.0)

## 2021-06-07 NOTE — Progress Notes (Signed)
Chief Complaint  Patient presents with   Elbow Pain   Pre-op Exam   HPI: Ms.Dannilynn Debruyn is a 60 y.o. female with hx of DM II,CAD,tobacco use disorder,anxiety,depression,and chronic pain here today for preop evaluation requested by Dr Wannetta Sender.  She was last seen on 04/29/21, virtual visit. She is planning on undergoing vaginal hysterectomy with bilateral oophorectomy due to uterovaginal prolapse on 07/02/21. I have not received preop form,per pt report, surgery will be under general anesthesia and ambulatory.  According to pt, the only preop lab is needed is HgA1C. She has not have procedure that require general anesthesia in years. Carpal tunnel syn in 03/2021.  Past Surgical History:  Procedure Laterality Date   CARDIAC CATHETERIZATION  10/14/2017   CARPAL TUNNEL RELEASE Right 04/10/2021   Procedure: RIGHT CARPAL TUNNEL RELEASE;  Surgeon: Leandrew Koyanagi, MD;  Location: [place];  Service: Orthopedics;  Laterality: Right;   CORONARY ANGIOPLASTY WITH STENT PLACEMENT  12/30/2004   Patient reported   CORONARY ARTERY BYPASS GRAFT N/A 10/19/2017   Procedure: CORONARY ARTERY BYPASS GRAFTING (CABG) times four using the right saphaneous vein. Harvested endoscopicly and left internal mammary artery.;  Surgeon: Grace Isaac, MD;  Location: [place];  Service: Open Heart Surgery;  Laterality: N/A;   DILATION AND CURETTAGE OF UTERUS  1980s   LEFT HEART CATH AND CORONARY ANGIOGRAPHY N/A 10/14/2017   Procedure: LEFT HEART CATH AND CORONARY ANGIOGRAPHY;  Surgeon: Martinique, Peter M, MD;  Location: [place] [facility];  Service: Cardiovascular;  Laterality: N/A;   LEFT HEART CATH AND CORS/GRAFTS ANGIOGRAPHY N/A 01/01/2018   Procedure: LEFT HEART CATH AND CORS/GRAFTS ANGIOGRAPHY;  Surgeon: Jettie Booze, MD;  Location: [place] [facility];  Service: Cardiovascular;  Laterality: N/A;   LEFT HEART CATH AND CORS/GRAFTS ANGIOGRAPHY N/A 08/16/2018   Procedure: LEFT HEART CATH AND CORS/GRAFTS  ANGIOGRAPHY;  Surgeon: Nelva Bush, MD;  Location: [place] [facility];  Service: Cardiovascular;  Laterality: N/A;   PILONIDAL CYST EXCISION  1980s   TEE WITHOUT CARDIOVERSION N/A 10/19/2017   Procedure: TRANSESOPHAGEAL ECHOCARDIOGRAM (TEE);  Surgeon: Grace Isaac, MD;  Location: [place];  Service: Open Heart Surgery;  Laterality: N/A;   TRIGGER FINGER RELEASE Right 04/10/2021   Procedure: RIGHT LONG FINGER TRIGGER RELEASE;  Surgeon: Leandrew Koyanagi, MD;  Location: [place];  Service: Orthopedics;  Laterality: Right;   Denies CP,palpitations,SOB,diaphoresis when walking a hill, climbing a flight of stairs,or carrying heave groceries. She has not had fever,chills,or dysuria.  Last visit diabetes treatment was adjusted. HgA1C 7.6 on 03/14/21. She is on Toujeo 48 U,Novolog Trulicity 1.5 mg weekly,and Farxiga 5 mg daily. She is not taking Novolog as instructed, taking it prn, q 2-3 days. BS's 150's-180's. 05/31/21 she had a 67. She has had a few in the 190's. Negative for polydipsia,polyuria, or polyphagia.  Decreased smoking to 2-3 cig/day. She is taking Chantix, completing treatment. She is also on Wellbutrin for depression but has helped with smoking cessation.  Negative for cough or DOE. Reporting "little" wheezing when lying down. It does not interfere with sleep. No hx of asthma or COPD.  She is also on Sertraline 100 mg 2 tabs daily.  Elbow pain: Right elbow pain is better. No hx of trauma.  Right hip pain, which is severe, interferes with certain activities. She has had pain for a few months. Pain is now constant. No recent injury.  She has a Rx for Prednisone for trigger finger pain, has not started  it yet.  She also would like iron check. Hx of anemia, she is not on iron supplementation. Colonoscopy in 04/2015.  Lab Results  Component Value Date   WBC 7.1 04/10/2021   HGB 11.3 (L) 04/10/2021   HCT 37.3 04/10/2021   MCV 79.0 (L) 04/10/2021   PLT 173 04/10/2021    HTN on Lisinopril 5 mg daily,Imdur 30 mg daily, and Metoprolol Tartrate 50 mg bid. Lab Results  Component Value Date   CREATININE 0.89 03/14/2021   BUN 18 03/14/2021   NA 138 03/14/2021   K 4.5 03/14/2021   CL 104 03/14/2021   CO2 26 03/14/2021   Review of Systems  Constitutional:  Positive for fatigue. Negative for activity change and appetite change.  HENT:  Negative for mouth sores, nosebleeds and sore throat.   Eyes:  Negative for redness and visual disturbance.  Respiratory:  Negative for shortness of breath and stridor.   Cardiovascular:  Negative for chest pain and palpitations.  Gastrointestinal:  Negative for abdominal pain, nausea and vomiting.       Negative for changes in bowel habits.  Endocrine: Negative for cold intolerance and heat intolerance.  Genitourinary:  Negative for decreased urine volume, dysuria and hematuria.  Musculoskeletal:  Positive for arthralgias. Negative for gait problem.  Skin:  Negative for pallor and rash.  Neurological:  Negative for seizures, syncope, weakness, numbness and headaches.  Psychiatric/Behavioral: Negative.    Rest of ROS, see pertinent positives sand negatives in HPI  Current Outpatient Medications on File Prior to Visit  Medication Sig Dispense Refill   amLODipine (NORVASC) 5 MG tablet TAKE ONE TABLET BY MOUTH ONE TIME DAILY 90 tablet 3   aspirin 325 MG tablet Take 325 mg by mouth daily.     buPROPion (WELLBUTRIN XL) 300 MG 24 hr tablet Take 1 tablet (300 mg total) by mouth daily. 90 tablet 0   clopidogrel (PLAVIX) 75 MG tablet Take 1 tablet (75 mg total) by mouth daily. 90 tablet 2   Continuous Blood Gluc Sensor (FREESTYLE LIBRE 14 DAY SENSOR) MISC APPLY ONE SENSOR TO THE BACK OF YOUR UPPER ARM. REPLACE EVERY 14 DAYS. 62 each 5   dapagliflozin propanediol (FARXIGA) 5 MG TABS tablet Take 1 tablet (5 mg total) by mouth daily before breakfast. 90 tablet 3   Dulaglutide (TRULICITY) 1.5 ZP/9.1TA SOPN Inject 1.5 mg into the skin  once a week. (Patient taking differently: Inject 1.5 mg into the skin every Tuesday.) 0.5 mL 6   insulin aspart (NOVOLOG FLEXPEN) 100 UNIT/ML FlexPen Inject 5-10 Units into the skin 3 (three) times daily with meals. Sliding scale     insulin glargine, 2 Unit Dial, (TOUJEO MAX SOLOSTAR) 300 UNIT/ML Solostar Pen Inject 48 Units into the skin daily. 9 mL 2   Insulin Pen Needle (ULTICARE MICRO PEN NEEDLES) 32G X 4 MM MISC USE WITH INJECTIONS ONE TIME DAILY 400 each 0   isosorbide mononitrate (IMDUR) 120 MG 24 hr tablet Take 1 tablet (120 mg total) by mouth daily. 90 tablet 3   lisinopril (ZESTRIL) 5 MG tablet Take 1 tablet (5 mg total) by mouth daily. 90 tablet 1   methocarbamol (ROBAXIN) 500 MG tablet Take 1 tablet (500 mg total) by mouth 2 (two) times daily as needed. 20 tablet 0   metoprolol tartrate (LOPRESSOR) 50 MG tablet TAKE ONE TABLET BY MOUTH TWICE A DAY 180 tablet 2   Multiple Vitamin (MULTIVITAMIN WITH MINERALS) TABS tablet Take 1 tablet by mouth daily.  Omega-3 Fatty Acids (FISH OIL) 1000 MG CAPS Take 1,000 mg by mouth daily.     oxyCODONE-acetaminophen (PERCOCET) 5-325 MG tablet Take 1-2 tablets by mouth every 8 (eight) hours as needed for severe pain. 30 tablet 0   rosuvastatin (CRESTOR) 40 MG tablet Take 1 tablet (40 mg total) by mouth daily. 90 tablet 3   sertraline (ZOLOFT) 100 MG tablet TAKE TWO TABLETS BY MOUTH ONE TIME DAILY 180 tablet 0   traMADol (ULTRAM) 50 MG tablet Take 1-2 tablets (50-100 mg total) by mouth daily as needed. 20 tablet 0   varenicline (CHANTIX) 1 MG tablet Take 1 tablet (1 mg total) by mouth 2 (two) times daily. 70 tablet 1   nitroGLYCERIN (NITROSTAT) 0.4 MG SL tablet Place 1 tablet (0.4 mg total) under the tongue every 5 (five) minutes as needed for chest pain. 25 tablet 3   No current facility-administered medications on file prior to visit.   Past Medical History:  Diagnosis Date   Anxiety    Arthritis    "maybe in my left foot" (10/14/2017)    Bladder prolapse, female, acquired 09/14/2017   Coronary artery disease    Depression    High cholesterol    History of stomach ulcers    Hypertension    Myocardial infarction ([place]) 12/30/2004   Tobacco abuse    Type II diabetes mellitus (HCC)    Allergies  Allergen Reactions   Januvia [Sitagliptin] Nausea And Vomiting and Other (See Comments)    Pancreatitis (this was in Epic, but patient was unaware of this)    Metformin And Related Diarrhea   Penicillins Other (See Comments)    Unknown childhood reaction Has patient had a PCN reaction causing immediate rash, facial/tongue/throat swelling, SOB or lightheadedness with hypotension: Unknown Has patient had a PCN reaction causing severe rash involving mucus membranes or skin necrosis: Unknown Has patient had a PCN reaction that required hospitalization: Unknown Has patient had a PCN reaction occurring within the last 10 years: No If all of the above answers are "NO", then may proceed with Cephalosporin use.     Social History   Socioeconomic History   Marital status: Widowed    Spouse name: Glendell Docker   Number of children: 1   Years of education: Not on file   Highest education level: Not on file  Occupational History   Not on file  Tobacco Use   Smoking status: Some Days    Packs/day: 0.20    Years: 45.00    Pack years: 9.00    Types: Cigarettes    Start date: 05/01/1972    Last attempt to quit: 09/30/2017    Years since quitting: 3.6   Smokeless tobacco: Never  Vaping Use   Vaping Use: Never used  Substance and Sexual Activity   Alcohol use: Yes    Alcohol/week: 7.0 standard drinks    Types: 7 Standard drinks or equivalent per week   Drug use: No   Sexual activity: Not Currently  Other Topics Concern   Not on file  Social History Narrative   Not on file   Social Determinants of Health   Financial Resource Strain: Not on file  Food Insecurity: Not on file  Transportation Needs: Not on file  Physical Activity: Not  on file  Stress: Not on file  Social Connections: Not on file   Vitals:   06/07/21 1050  BP: 128/80  Pulse: 72  Resp: 16  SpO2: 97%   Body mass index is 31.15  kg/m.  Physical Exam Vitals and nursing note reviewed.  Constitutional:      General: She is not in acute distress.    Appearance: She is well-developed.  HENT:     Head: Normocephalic and atraumatic.     Mouth/Throat:     Mouth: Mucous membranes are moist.     Pharynx: Oropharynx is clear.  Eyes:     Conjunctiva/sclera: Conjunctivae normal.  Cardiovascular:     Rate and Rhythm: Normal rate and regular rhythm.     Pulses:          Dorsalis pedis pulses are 2+ on the right side and 2+ on the left side.     Heart sounds: No murmur heard. Pulmonary:     Effort: Pulmonary effort is normal. No respiratory distress.     Breath sounds: Normal breath sounds.  Abdominal:     Palpations: Abdomen is soft. There is no hepatomegaly or mass.     Tenderness: There is no abdominal tenderness.  Musculoskeletal:     Right hip: Tenderness (With papation) present. No deformity, bony tenderness or crepitus. Normal range of motion.       Legs:  Lymphadenopathy:     Cervical: No cervical adenopathy.  Skin:    General: Skin is warm.     Findings: No erythema or rash.  Neurological:     General: No focal deficit present.     Mental Status: She is alert and oriented to person, place, and time.     Cranial Nerves: No cranial nerve deficit.     Comments: Stable, antalgic gait, not assisted.  Psychiatric:     Comments: Well groomed, good eye contact.   ASSESSMENT AND PLAN:  Ms. Alexzia Kasler was seen today for preop evaluation and joint pain.  Orders Placed This Encounter  Procedures   DG Chest 2 View   Ferritin   Iron   POC HgB A1c   Lab Results  Component Value Date   HGBA1C 7.1 (A) 06/07/2021   Pre-op exam Chronic medical problems stable. Pending cardiac clearance from her cardiologist. She knows to stop  aspirin,NSAID's,Plavix,and OTC supplements at least 7 days before surgery. Stop Farxiga 3 days before. She can take her day time medications after surgery. She can take Metoprolol tartrate in the morning as usual. Adequate glucose control will help with healing process and decrease risk of complications. Stressed the importance of smoking cessation, we discussed adverse effects. DVT prophylaxis: Early ambulation. Medically cleared for vaginal hysterectomy.  Type 2 diabetes mellitus with complication, with long-term current use of insulin (HCC) HgA1C improved but still not at goal. Toujeo increased from 48 to 52 U. Novolog before meals. No changes in Trulicity dsoe. Continue appropriate foot care and annual ye exam.  Iron deficiency anemia, unspecified iron deficiency anemia type Further recommendations according to lab results. Will try to obtain copy of her last colonoscopy.  Wheezing Lung auscultation negative today. ? COPD. CXR ordered. Continue working on [employer] cessation.  Right hip pain We discussed possible etiologies. Examination suggest trochanteric bursitis. Treatment options reviewed. She is planning on starting Prednisone taper for carpal tunnel synd, this may help with hip pain. Side effects of Prednisone discussed.  Essential hypertension BP adequately controlled. No changes in Metoprolol tartrate,Lisinopril.or Imdur dose.   Return in about 3 months (around 09/06/2021).   Sherri Mcarthy G. Martinique, MD  [facility]. [place] office.

## 2021-06-07 NOTE — Patient Instructions (Signed)
A few things to remember from today's visit:   Pre-op exam  Type 2 diabetes mellitus with complication, with long-term current use of insulin ([place]) - Plan: POC HgB A1c  Iron deficiency anemia, unspecified iron deficiency anemia type  Wheezing  Right hip pain  If you need refills please call your pharmacy. Do not use My Chart to request refills or for acute issues that need immediate attention.   Hold Farxiga 3 days before surgery. Smoking cessation before surgery.  Toujeo increased from 48 to 52 U. Hip pain can be cause by trochanteric bursitis. Oral prednisone may help.  Please be sure medication list is accurate. If a new problem present, please set up appointment sooner than planned today.

## 2021-06-09 ENCOUNTER — Encounter: Payer: Self-pay | Admitting: Family Medicine

## 2021-06-09 MED ORDER — IRON (FERROUS SULFATE) 325 (65 FE) MG PO TABS: 325.0000 mg | ORAL_TABLET | Freq: Every day | ORAL | 2 refills | Status: DC

## 2021-06-24 NOTE — Progress Notes (Addendum)
[facility] due to ef is 40 % and does not meet [facility] guidelines. Sent epic ib message to dr schroeder, per anesthesai guidelines pt ef is 40 % and needs to be [facility] or

## 2021-06-26 ENCOUNTER — Other Ambulatory Visit: Payer: Self-pay | Admitting: Internal Medicine

## 2021-06-27 ENCOUNTER — Ambulatory Visit (HOSPITAL_BASED_OUTPATIENT_CLINIC_OR_DEPARTMENT_OTHER): Payer: 59 | Admitting: Obstetrics & Gynecology

## 2021-06-27 NOTE — Progress Notes (Signed)
[facility] Pre-Operative H&P- virtual visit  Subjective Chief Complaint: Victoria Eaton presents for a preoperative encounter.   History of Present Illness: Victoria Eaton is a 60 y.o. female who presents for preoperative visit.  She is scheduled to undergo Exam under anesthesia, total vaginal hysterectomy, bilateral salpingectomy, uterosacral ligament suspension, anterior and posterior repair with perineorrhaphy, urethral bulking, cystoscopy  on 07/09/21.  Her symptoms include vaginal bulge, and she was was found to have Stage IV anterior, Stage IV posterior, Stage IV apical prolapse .  Urodynamics showed: 1. Sensation was normal; capacity was normal 2. Stress Incontinence was demonstrated at normal pressures; 3. Detrusor Overactivity was not demonstrated. 4. Emptying was normal with a normal PVR, a sustained detrusor contraction present,  abdominal straining present, normal urethral sphincter activity on EMG.  Past Medical History:  Diagnosis Date   Anxiety    Arthritis    "maybe in my left foot" (10/14/2017)   Bladder prolapse, female, acquired 09/14/2017   Coronary artery disease    Depression    High cholesterol    History of stomach ulcers    Hypertension    Myocardial infarction ([place]) 12/30/2004   Tobacco abuse    Type II diabetes mellitus ([place])      Past Surgical History:  Procedure Laterality Date   CARDIAC CATHETERIZATION  10/14/2017   CARPAL TUNNEL RELEASE Right 04/10/2021   Procedure: RIGHT CARPAL TUNNEL RELEASE;  Surgeon: Leandrew Koyanagi, MD;  Location: [facility];  Service: Orthopedics;  Laterality: Right;   CORONARY ANGIOPLASTY WITH STENT PLACEMENT  12/30/2004   Patient reported   CORONARY ARTERY BYPASS GRAFT N/A 10/19/2017   Procedure: CORONARY ARTERY BYPASS GRAFTING (CABG) times four using the right saphaneous vein. Harvested endoscopicly and left internal mammary artery.;  Surgeon: Grace Isaac, MD;  Location: [facility];  Service: Open Heart Surgery;   Laterality: N/A;   DILATION AND CURETTAGE OF UTERUS  1980s   LEFT HEART CATH AND CORONARY ANGIOGRAPHY N/A 10/14/2017   Procedure: LEFT HEART CATH AND CORONARY ANGIOGRAPHY;  Surgeon: Martinique, Peter M, MD;  Location: [facility];  Service: Cardiovascular;  Laterality: N/A;   LEFT HEART CATH AND CORS/GRAFTS ANGIOGRAPHY N/A 01/01/2018   Procedure: LEFT HEART CATH AND CORS/GRAFTS ANGIOGRAPHY;  Surgeon: Jettie Booze, MD;  Location: [facility];  Service: Cardiovascular;  Laterality: N/A;   LEFT HEART CATH AND CORS/GRAFTS ANGIOGRAPHY N/A 08/16/2018   Procedure: LEFT HEART CATH AND CORS/GRAFTS ANGIOGRAPHY;  Surgeon: Nelva Bush, MD;  Location: [facility];  Service: Cardiovascular;  Laterality: N/A;   PILONIDAL CYST EXCISION  1980s   TEE WITHOUT CARDIOVERSION N/A 10/19/2017   Procedure: TRANSESOPHAGEAL ECHOCARDIOGRAM (TEE);  Surgeon: Grace Isaac, MD;  Location: [facility];  Service: Open Heart Surgery;  Laterality: N/A;   TRIGGER FINGER RELEASE Right 04/10/2021   Procedure: RIGHT LONG FINGER TRIGGER RELEASE;  Surgeon: Leandrew Koyanagi, MD;  Location: [facility];  Service: Orthopedics;  Laterality: Right;    is allergic to [place] [sitagliptin], metformin and related, and penicillins.   Family History  Problem Relation Age of Onset   Heart disease Father    Hyperlipidemia Father    Hypertension Father     Social History   Tobacco Use   Smoking status: Some Days    Packs/day: 0.20    Years: 45.00    Pack years: 9.00    Types: Cigarettes    Start date: 05/01/1972    Last attempt to quit: 09/30/2017  Years since quitting: 3.7   Smokeless tobacco: Never  Vaping Use   Vaping Use: Never used  Substance Use Topics   Alcohol use: Yes    Alcohol/week: 7.0 standard drinks    Types: 7 Standard drinks or equivalent per week   Drug use: No     Review of Systems was negative for a full 10 system review except as noted in the History of Present Illness.   Current  Outpatient Medications:    amLODipine (NORVASC) 5 MG tablet, TAKE ONE TABLET BY MOUTH ONE TIME DAILY, Disp: 90 tablet, Rfl: 3   aspirin 81 MG EC tablet, Take 81 mg by mouth daily., Disp: , Rfl:    buPROPion (WELLBUTRIN XL) 300 MG 24 hr tablet, Take 1 tablet (300 mg total) by mouth daily., Disp: 90 tablet, Rfl: 0   clopidogrel (PLAVIX) 75 MG tablet, Take 1 tablet (75 mg total) by mouth daily., Disp: 90 tablet, Rfl: 2   Continuous Blood Gluc Sensor (FREESTYLE LIBRE 14 DAY SENSOR) MISC, APPLY ONE SENSOR TO THE BACK OF YOUR UPPER ARM. REPLACE EVERY 14 DAYS., Disp: 62 each, Rfl: 5   dapagliflozin propanediol (FARXIGA) 5 MG TABS tablet, Take 1 tablet (5 mg total) by mouth daily before breakfast., Disp: 90 tablet, Rfl: 3   Dulaglutide (TRULICITY) 1.5 MG/8.6PY SOPN, Inject 1.5 mg into the skin once a week. (Patient taking differently: Inject 1.5 mg into the skin every Tuesday.), Disp: 0.5 mL, Rfl: 6   insulin aspart (NOVOLOG FLEXPEN) 100 UNIT/ML FlexPen, Inject 5 Units into the skin 3 (three) times daily with meals as needed for high blood sugar., Disp: , Rfl:    insulin glargine, 2 Unit Dial, (TOUJEO MAX SOLOSTAR) 300 UNIT/ML Solostar Pen, Inject 48 Units into the skin daily. (Patient taking differently: Inject 52 Units into the skin every morning.), Disp: 9 mL, Rfl: 2   Insulin Pen Needle (ULTICARE MICRO PEN NEEDLES) 32G X 4 MM MISC, USE WITH INJECTIONS ONE TIME DAILY, Disp: 400 each, Rfl: 0   Iron, Ferrous Sulfate, 325 (65 Fe) MG TABS, Take 325 mg by mouth daily., Disp: 90 tablet, Rfl: 2   isosorbide mononitrate (IMDUR) 120 MG 24 hr tablet, Take 1 tablet (120 mg total) by mouth daily., Disp: 90 tablet, Rfl: 3   lisinopril (ZESTRIL) 5 MG tablet, Take 1 tablet (5 mg total) by mouth daily., Disp: 90 tablet, Rfl: 1   loratadine (CLARITIN) 10 MG tablet, Take 10 mg by mouth daily., Disp: , Rfl:    methocarbamol (ROBAXIN) 500 MG tablet, Take 1 tablet (500 mg total) by mouth 2 (two) times daily as needed., Disp:  20 tablet, Rfl: 0   metoprolol tartrate (LOPRESSOR) 50 MG tablet, TAKE ONE TABLET BY MOUTH TWICE A DAY, Disp: 180 tablet, Rfl: 2   Multiple Vitamin (MULTIVITAMIN WITH MINERALS) TABS tablet, Take 1 tablet by mouth daily., Disp: , Rfl:    nitroGLYCERIN (NITROSTAT) 0.4 MG SL tablet, Place 0.4 mg under the tongue every 5 (five) minutes as needed for chest pain., Disp: , Rfl:    Omega-3 Fatty Acids (FISH OIL) 1200 MG CAPS, Take 1,200 mg by mouth daily., Disp: , Rfl:    oxyCODONE-acetaminophen (PERCOCET) 10-325 MG tablet, Take 1 tablet by mouth every 6 (six) hours as needed for pain., Disp: , Rfl:    oxyCODONE-acetaminophen (PERCOCET) 5-325 MG tablet, Take 1-2 tablets by mouth every 8 (eight) hours as needed for severe pain., Disp: 30 tablet, Rfl: 0   rosuvastatin (CRESTOR) 40 MG tablet, Take 1  tablet (40 mg total) by mouth daily., Disp: 90 tablet, Rfl: 3   sertraline (ZOLOFT) 100 MG tablet, TAKE TWO TABLETS BY MOUTH ONE TIME DAILY, Disp: 180 tablet, Rfl: 0   traMADol (ULTRAM) 50 MG tablet, Take 1-2 tablets (50-100 mg total) by mouth daily as needed., Disp: 20 tablet, Rfl: 0   Turmeric 500 MG CAPS, Take 1,000 mg by mouth daily., Disp: , Rfl:    varenicline (CHANTIX) 1 MG tablet, Take 1 tablet (1 mg total) by mouth 2 (two) times daily., Disp: 70 tablet, Rfl: 1   Objective There were no vitals filed for this visit.  Gen: NAD    Previous Pelvic Exam showed: POP-Q (12/31/20):    POP-Q   3                                            Aa   7                                           Ba   8                                              C    6                                            Gh   2.5                                            Pb   8                                            tvl    3                                            Ap   5                                            Bp   -4                                              D          Assessment/  Plan  Assessment: The patient is a 60 y.o. year old scheduled to undergo Exam under anesthesia, total vaginal hysterectomy, uterosacral ligament suspension, anterior and posterior repair with perineorrhaphy, urethral bulking, cystoscopy. Verbal consent was obtained for these procedures.  Plan: General Surgical Consent: The patient has previously been counseled on alternative treatments, and the decision by the patient and provider was to proceed with the procedure listed above.  For all procedures, there are risks of bleeding, infection, damage to surrounding organs including but not limited to bowel, bladder, blood vessels, ureters and nerves, and need for further surgery if an injury were to occur. These risks are all low with minimally invasive surgery.   There are risks of numbness and weakness at any body site or buttock/rectal pain.  It is possible that baseline pain can be worsened by surgery, either with or without mesh. If surgery is vaginal, there is also a low risk of possible conversion to laparoscopy or open abdominal incision where indicated. Very rare risks include blood transfusion, blood clot, heart attack, pneumonia, or death.   There is also a risk of short-term postoperative urinary retention with need to use a catheter. About half of patients need to go home from surgery with a catheter, which is then later removed in the office. The risk of long-term need for a catheter is very low. There is also a risk of worsening of overactive bladder.     Prolapse (with or without mesh): Risk factors for surgical failure  include things that put pressure on your pelvis and the surgical repair, including obesity, chronic cough, and heavy lifting or straining (including lifting children or adults, straining on the toilet, or lifting heavy objects such as furniture or anything weighing >25 lbs. Risks of recurrence is 20-30% with vaginal native tissue repair and a less than 10% with  sacrocolpopexy with mesh.    We discussed consent for blood products. Risks for blood transfusion include allergic reactions, other reactions that can affect different body organs and managed accordingly, transmission of infectious diseases such as HIV or Hepatitis. However, the blood is screened. Patient consents for blood products.  Pre-operative instructions:  She was instructed to not take Aspirin/NSAIDs x 7days prior to surgery. She was instructed to hold plavix for 5 days prior to the procedure. Antibiotic prophylaxis was ordered as indicated.  Cathter use: Patient will go home with foley if needed after post-operative voiding trial.  Post-operative instructions:  She was provided with specific post-operative instructions, including precautions and signs/symptoms for which we would recommend contacting us, in addition to daytime and after-hours contact phone numbers. This was provided on a handout.   Post-operative medications: Prescriptions for motrin, tylenol, miralax and oxycodone were sent to her pharmacy. Discussed using ibuprofen and tylenol on a schedule to limit use of narcotics.   Laboratory testing:  Had recent CBC and Hgb A1c of 7.1. Will need type and screen. .    Preoperative clearance:  She does require surgical clearance- obtained from cardiology.    Post-operative follow-up:  A post-operative appointment will be made for 6 weeks from the date of surgery. If she needs a post-operative nurse visit for a voiding trial, that will be set up after she leaves the hospital.    Patient will call the clinic or use MyChart should anything change or any new issues arise.   [name], MD

## 2021-06-27 NOTE — Progress Notes (Signed)
Sent message, via epic in basket, requesting orders in epic from surgeon.  

## 2021-06-28 ENCOUNTER — Encounter (HOSPITAL_COMMUNITY): Admission: RE | Admit: 2021-06-28 | Payer: 59 | Source: Ambulatory Visit

## 2021-06-28 ENCOUNTER — Telehealth (INDEPENDENT_AMBULATORY_CARE_PROVIDER_SITE_OTHER): Payer: 59 | Admitting: Obstetrics and Gynecology

## 2021-06-28 ENCOUNTER — Other Ambulatory Visit: Payer: Self-pay

## 2021-06-28 DIAGNOSIS — Z01818 Encounter for other preprocedural examination: Secondary | ICD-10-CM

## 2021-06-28 MED ORDER — ACETAMINOPHEN 500 MG PO TABS: 500.0000 mg | ORAL_TABLET | Freq: Four times a day (QID) | ORAL | 0 refills | Status: AC | PRN

## 2021-06-28 MED ORDER — OXYCODONE HCL 5 MG PO TABS
5.0000 mg | ORAL_TABLET | ORAL | 0 refills | Status: DC | PRN
Start: 2021-06-28 — End: 2022-05-16

## 2021-06-28 MED ORDER — POLYETHYLENE GLYCOL 3350 17 GM/SCOOP PO POWD
17.0000 g | Freq: Every day | ORAL | 0 refills | Status: DC
Start: 2021-06-28 — End: 2022-06-18

## 2021-06-28 MED ORDER — IBUPROFEN 600 MG PO TABS: 600.0000 mg | ORAL_TABLET | Freq: Four times a day (QID) | ORAL | 0 refills | Status: DC | PRN

## 2021-06-28 NOTE — Patient Instructions (Signed)
POST OPERATIVE INSTRUCTIONS  General Instructions Recovery (not bed rest) will last approximately 6 weeks Walking is encouraged, but refrain from strenuous exercise/ housework/ heavy lifting. No lifting >10lbs  Nothing in the vagina- NO intercourse, tampons or douching Bathing:  Do not submerge in water (NO swimming, bath, hot tub, etc) until after your postop visit. You can shower starting the day after surgery.  No driving until you are not taking narcotic pain medicine and until your pain is well enough controlled that you can slam on the breaks or make sudden movements if needed.   Taking your medications Please take your acetaminophen and ibuprofen on a schedule for the first 48 hours. Take 667m ibuprofen, then take 5051macetaminophen 3 hours later, then continue to alternate ibuprofen and acetaminophen. That way you are taking each type of medication every 6 hours. Take the prescribed narcotic (oxycodone, tramadol, etc) as needed, with a maximum being every 4 hours.  Take a stool softener daily to keep your stools soft and preventing you from straining. If you have diarrhea, you decrease your stool softener. This is explained more below. We have prescribed you Miralax.  Reasons to Call the Nurse (see last page for phone numbers) Heavy Bleeding (changing your pad every 1-2 hours) Persistent nausea/vomiting Fever (100.4 degrees or more) Incision problems (pus or other fluid coming out, redness, warmth, increased pain)  Things to Expect After Surgery Mild to Moderate pain is normal during the first day or two after surgery. If prescribed, take Ibuprofen or Tylenol first and use the stronger medicine for "break-through" pain. You can overlap these medicines because they work differently.   Constipation   To Prevent Constipation:  Eat a well-balanced diet including protein, grains, fresh fruit and vegetables.  Drink plenty of fluids. Walk regularly.  Depending on specific instructions  from your physician: take Miralax daily and additionally you can add a stool softener (colace/ docusate) and fiber supplement. Continue as long as you're on pain medications.   To Treat Constipation:  If you do not have a bowel movement in 2 days after surgery, you can take 2 Tbs of Milk of Magnesia 1-2 times a day until you have a bowel movement. If diarrhea occurs, decrease the amount or stop the laxative. If no results with Milk of Magnesia, you can drink a bottle of magnesium citrate which you can purchase over the counter.  Fatigue:  This is a normal response to surgery and will improve with time.  Plan frequent rest periods throughout the day.  Gas Pain:  This is very common but can also be very painful! Drink warm liquids such as herbal teas, bouillon or soup. Walking will help you pass more gas.  Mylicon or Gas-X can be taken over the counter.  Leaking Urine:  Varying amounts of leakage may occur after surgery.  This should improve with time. Your bladder needs at least 3 months to recover from surgery. If you leak after surgery, be sure to mention this to your doctor at your post-op visit. If you were taking medications for overactive bladder prior to surgery, be sure to restart the medications immediately after surgery.  Incisions: If you have incisions on your abdomen, the skin glue will dissolve on its own over time. It is ok to gently rinse with soap and water over these incisions but do not scrub.  Catheter Approximately 50% of patients are unable to urinate after surgery and need to go home with a catheter. This allows your bladder to  rest so it can return to full function. If you go home with a catheter, the office will call to set up a voiding trial a few days after surgery. For most patients, by this visit, they are able to urinate on their own. Long term catheter use is rare.   Return to Work  As work demands and recovery times vary widely, it is hard to predict when you will want  to return to work. If you have a desk job with no strenuous physical activity, and if you would like to return sooner than generally recommended, discuss this with your provider or call our office.   Post op concerns  For non-emergent issues, please call the Urogynecology Nurse. Please leave a message and someone will contact you within one business day.  You can also send a message through [facility].   AFTER HOURS (After 5:00 PM and on weekends):  For urgent matters that cannot wait until the next business day. Call our office [phone] and connect to the doctor on call.  Please reserve this for important issues.   **FOR ANY TRUE EMERGENCY ISSUES CALL 911 OR GO TO THE NEAREST EMERGENCY ROOM.** Please inform our office or the doctor on call of any emergency.     APPOINTMENTS: Call [phone]

## 2021-06-28 NOTE — H&P (Signed)
[facility] Pre-Operative H&P  Subjective Chief Complaint: Victoria Eaton presents for a preoperative encounter.   History of Present Illness: Victoria Eaton is a 60 y.o. female who presents for preoperative visit.  She is scheduled to undergo Exam under anesthesia, total vaginal hysterectomy, bilateral salpingectomy, uterosacral ligament suspension, anterior and posterior repair with perineorrhaphy, urethral bulking, cystoscopy  on 07/09/21.  Her symptoms include vaginal bulge, and she was was found to have Stage IV anterior, Stage IV posterior, Stage IV apical prolapse .  Urodynamics showed: 1. Sensation was normal; capacity was normal 2. Stress Incontinence was demonstrated at normal pressures; 3. Detrusor Overactivity was not demonstrated. 4. Emptying was normal with a normal PVR, a sustained detrusor contraction present,  abdominal straining present, normal urethral sphincter activity on EMG.  Past Medical History:  Diagnosis Date   Anxiety    Arthritis    "maybe in my left foot" (10/14/2017)   Bladder prolapse, female, acquired 09/14/2017   Coronary artery disease    Depression    High cholesterol    History of stomach ulcers    Hypertension    Myocardial infarction ([place]) 12/30/2004   Tobacco abuse    Type II diabetes mellitus ([place])      Past Surgical History:  Procedure Laterality Date   CARDIAC CATHETERIZATION  10/14/2017   CARPAL TUNNEL RELEASE Right 04/10/2021   Procedure: RIGHT CARPAL TUNNEL RELEASE;  Surgeon: Leandrew Koyanagi, MD;  Location: [place];  Service: Orthopedics;  Laterality: Right;   CORONARY ANGIOPLASTY WITH STENT PLACEMENT  12/30/2004   Patient reported   CORONARY ARTERY BYPASS GRAFT N/A 10/19/2017   Procedure: CORONARY ARTERY BYPASS GRAFTING (CABG) times four using the right saphaneous vein. Harvested endoscopicly and left internal mammary artery.;  Surgeon: Grace Isaac, MD;  Location: [place];  Service: Open Heart Surgery;  Laterality:  N/A;   DILATION AND CURETTAGE OF UTERUS  1980s   LEFT HEART CATH AND CORONARY ANGIOGRAPHY N/A 10/14/2017   Procedure: LEFT HEART CATH AND CORONARY ANGIOGRAPHY;  Surgeon: Martinique, Peter M, MD;  Location: [place] [facility];  Service: Cardiovascular;  Laterality: N/A;   LEFT HEART CATH AND CORS/GRAFTS ANGIOGRAPHY N/A 01/01/2018   Procedure: LEFT HEART CATH AND CORS/GRAFTS ANGIOGRAPHY;  Surgeon: Jettie Booze, MD;  Location: [place] [facility];  Service: Cardiovascular;  Laterality: N/A;   LEFT HEART CATH AND CORS/GRAFTS ANGIOGRAPHY N/A 08/16/2018   Procedure: LEFT HEART CATH AND CORS/GRAFTS ANGIOGRAPHY;  Surgeon: Nelva Bush, MD;  Location: [place] [facility];  Service: Cardiovascular;  Laterality: N/A;   PILONIDAL CYST EXCISION  1980s   TEE WITHOUT CARDIOVERSION N/A 10/19/2017   Procedure: TRANSESOPHAGEAL ECHOCARDIOGRAM (TEE);  Surgeon: Grace Isaac, MD;  Location: [place];  Service: Open Heart Surgery;  Laterality: N/A;   TRIGGER FINGER RELEASE Right 04/10/2021   Procedure: RIGHT LONG FINGER TRIGGER RELEASE;  Surgeon: Leandrew Koyanagi, MD;  Location: [place];  Service: Orthopedics;  Laterality: Right;    is allergic to [place] [sitagliptin], metformin and related, and penicillins.   Family History  Problem Relation Age of Onset   Heart disease Father    Hyperlipidemia Father    Hypertension Father     Social History   Tobacco Use   Smoking status: Some Days    Packs/day: 0.20    Years: 45.00    Pack years: 9.00    Types: Cigarettes    Start date: 05/01/1972    Last attempt to quit: 09/30/2017    Years since  quitting: 3.7   Smokeless tobacco: Never  Vaping Use   Vaping Use: Never used  Substance Use Topics   Alcohol use: Yes    Alcohol/week: 7.0 standard drinks    Types: 7 Standard drinks or equivalent per week   Drug use: No     Review of Systems was negative for a full 10 system review except as noted in the History of Present Illness.  No current  facility-administered medications for this encounter.  Current Outpatient Medications:    amLODipine (NORVASC) 5 MG tablet, TAKE ONE TABLET BY MOUTH ONE TIME DAILY, Disp: 90 tablet, Rfl: 3   aspirin 81 MG EC tablet, Take 81 mg by mouth daily., Disp: , Rfl:    buPROPion (WELLBUTRIN XL) 300 MG 24 hr tablet, Take 1 tablet (300 mg total) by mouth daily., Disp: 90 tablet, Rfl: 0   clopidogrel (PLAVIX) 75 MG tablet, Take 1 tablet (75 mg total) by mouth daily., Disp: 90 tablet, Rfl: 2   dapagliflozin propanediol (FARXIGA) 5 MG TABS tablet, Take 1 tablet (5 mg total) by mouth daily before breakfast., Disp: 90 tablet, Rfl: 3   Dulaglutide (TRULICITY) 1.5 ZP/9.1TA SOPN, Inject 1.5 mg into the skin once a week. (Patient taking differently: Inject 1.5 mg into the skin every Tuesday.), Disp: 0.5 mL, Rfl: 6   insulin aspart (NOVOLOG FLEXPEN) 100 UNIT/ML FlexPen, Inject 5 Units into the skin 3 (three) times daily with meals as needed for high blood sugar., Disp: , Rfl:    insulin glargine, 2 Unit Dial, (TOUJEO MAX SOLOSTAR) 300 UNIT/ML Solostar Pen, Inject 48 Units into the skin daily. (Patient taking differently: Inject 52 Units into the skin every morning.), Disp: 9 mL, Rfl: 2   Iron, Ferrous Sulfate, 325 (65 Fe) MG TABS, Take 325 mg by mouth daily., Disp: 90 tablet, Rfl: 2   isosorbide mononitrate (IMDUR) 120 MG 24 hr tablet, Take 1 tablet (120 mg total) by mouth daily., Disp: 90 tablet, Rfl: 3   lisinopril (ZESTRIL) 5 MG tablet, Take 1 tablet (5 mg total) by mouth daily., Disp: 90 tablet, Rfl: 1   loratadine (CLARITIN) 10 MG tablet, Take 10 mg by mouth daily., Disp: , Rfl:    methocarbamol (ROBAXIN) 500 MG tablet, Take 1 tablet (500 mg total) by mouth 2 (two) times daily as needed., Disp: 20 tablet, Rfl: 0   metoprolol tartrate (LOPRESSOR) 50 MG tablet, TAKE ONE TABLET BY MOUTH TWICE A DAY, Disp: 180 tablet, Rfl: 2   Multiple Vitamin (MULTIVITAMIN WITH MINERALS) TABS tablet, Take 1 tablet by mouth daily.,  Disp: , Rfl:    nitroGLYCERIN (NITROSTAT) 0.4 MG SL tablet, Place 0.4 mg under the tongue every 5 (five) minutes as needed for chest pain., Disp: , Rfl:    Omega-3 Fatty Acids (FISH OIL) 1200 MG CAPS, Take 1,200 mg by mouth daily., Disp: , Rfl:    oxyCODONE-acetaminophen (PERCOCET) 10-325 MG tablet, Take 1 tablet by mouth every 6 (six) hours as needed for pain., Disp: , Rfl:    oxyCODONE-acetaminophen (PERCOCET) 5-325 MG tablet, Take 1-2 tablets by mouth every 8 (eight) hours as needed for severe pain., Disp: 30 tablet, Rfl: 0   rosuvastatin (CRESTOR) 40 MG tablet, Take 1 tablet (40 mg total) by mouth daily., Disp: 90 tablet, Rfl: 3   sertraline (ZOLOFT) 100 MG tablet, TAKE TWO TABLETS BY MOUTH ONE TIME DAILY, Disp: 180 tablet, Rfl: 0   traMADol (ULTRAM) 50 MG tablet, Take 1-2 tablets (50-100 mg total) by mouth daily as needed., Disp:  20 tablet, Rfl: 0   Turmeric 500 MG CAPS, Take 1,000 mg by mouth daily., Disp: , Rfl:    varenicline (CHANTIX) 1 MG tablet, Take 1 tablet (1 mg total) by mouth 2 (two) times daily., Disp: 70 tablet, Rfl: 1   acetaminophen (TYLENOL) 500 MG tablet, Take 1 tablet (500 mg total) by mouth every 6 (six) hours as needed (pain)., Disp: 30 tablet, Rfl: 0   Continuous Blood Gluc Sensor (FREESTYLE LIBRE 14 DAY SENSOR) MISC, APPLY ONE SENSOR TO THE BACK OF YOUR UPPER ARM. REPLACE EVERY 14 DAYS., Disp: 62 each, Rfl: 5   ibuprofen (ADVIL) 600 MG tablet, Take 1 tablet (600 mg total) by mouth every 6 (six) hours as needed., Disp: 30 tablet, Rfl: 0   Insulin Pen Needle (ULTICARE MICRO PEN NEEDLES) 32G X 4 MM MISC, USE WITH INJECTIONS ONE TIME DAILY, Disp: 400 each, Rfl: 0   oxyCODONE (OXY IR/ROXICODONE) 5 MG immediate release tablet, Take 1 tablet (5 mg total) by mouth every 4 (four) hours as needed for severe pain., Disp: 15 tablet, Rfl: 0   polyethylene glycol powder (GLYCOLAX/MIRALAX) 17 GM/SCOOP powder, Take 17 g by mouth daily. Drink 17g (1 scoop) dissolved in water per day., Disp:  255 g, Rfl: 0   Objective There were no vitals filed for this visit.  Gen: NAD    Previous Pelvic Exam showed: POP-Q (12/31/20):    POP-Q   3                                            Aa   7                                           Ba   8                                              C    6                                            Gh   2.5                                            Pb   8                                            tvl    3                                            Ap   5  Bp   -4                                              D          Assessment/ Plan  The patient is a 60 y.o. year old scheduled to undergo Exam under anesthesia, total vaginal hysterectomy, uterosacral ligament suspension, anterior and posterior repair with perineorrhaphy, urethral bulking, cystoscopy.    [name], MD

## 2021-07-02 NOTE — Patient Instructions (Addendum)
DUE TO COVID-19 ONLY ONE VISITOR IS ALLOWED TO COME WITH YOU AND STAY IN THE WAITING ROOM ONLY DURING PRE OP AND PROCEDURE.   **NO VISITORS ARE ALLOWED IN THE SHORT STAY AREA OR RECOVERY ROOM!!**  IF YOU WILL BE ADMITTED INTO THE HOSPITAL YOU ARE ALLOWED ONLY TWO SUPPORT PEOPLE DURING VISITATION HOURS ONLY (10AM -8PM)   The support person(s) may change daily. The support person(s) must pass our screening, gel in and out, and wear a mask at all times, including in the patient's room. Patients must also wear a mask when staff or their support person are in the room.  No visitors under the age of 33. Any visitor under the age of 57 must be accompanied by an adult.     Your procedure is scheduled on: 07/09/21   Report to [facility] Main Entrance    Report to admitting at : 10:45 AM   Call this number if you have problems the morning of surgery [phone]   Do not eat food :After Midnight.   May have liquids until : 10:00 AM   day of surgery  CLEAR LIQUID DIET  Foods Allowed                                                                     Foods Excluded  Water, Black Coffee and tea, regular and decaf                             liquids that you cannot  Plain Jell-O in any flavor  (No red)                                           see through such as: Fruit ices (not with fruit pulp)                                     milk, soups, orange juice              Iced Popsicles (No red)                                    All solid food                                   Apple juices Sports drinks like Gatorade (No red) Lightly seasoned clear broth or consume(fat free) Sugar,   Sample Menu Breakfast                                Lunch                                     Supper Cranberry juice  Beef broth                            Chicken broth Jell-O                                     Grape juice                           Apple juice Coffee or tea                         Jell-O                                      Popsicle                                                Coffee or tea                        Coffee or tea    Oral Hygiene is also important to reduce your risk of infection.                                    Remember - BRUSH YOUR TEETH THE MORNING OF SURGERY WITH YOUR REGULAR TOOTHPASTE   Do NOT smoke after Midnight   Take these medicines the morning of surgery with A SIP OF WATER: isosorbide,bupropion,sertraline,loratadine,metoprolol,amlodipine  How to Manage Your Diabetes Before and After Surgery  Why is it important to control my blood sugar before and after surgery? Improving blood sugar levels before and after surgery helps healing and can limit problems. A way of improving blood sugar control is eating a healthy diet by:  Eating less sugar and carbohydrates  Increasing activity/exercise  Talking with your doctor about reaching your blood sugar goals High blood sugars (greater than 180 mg/dL) can raise your risk of infections and slow your recovery, so you will need to focus on controlling your diabetes during the weeks before surgery. Make sure that the doctor who takes care of your diabetes knows about your planned surgery including the date and location.  How do I manage my blood sugar before surgery? Check your blood sugar at least 4 times a day, starting 2 days before surgery, to make sure that the level is not too high or low. Check your blood sugar the morning of your surgery when you wake up and every 2 hours until you get to the Short Stay unit. If your blood sugar is less than 70 mg/dL, you will need to treat for low blood sugar: Do not take insulin. Treat a low blood sugar (less than 70 mg/dL) with  cup of clear juice (cranberry or apple), 4 glucose tablets, OR glucose gel. Recheck blood sugar in 15 minutes after treatment (to make sure it is greater than 70 mg/dL). If your blood sugar is not greater than 70  mg/dL on recheck, call [phone] for further instructions. Report your blood sugar to the short stay nurse  when you get to Short Stay.  If you are admitted to the hospital after surgery: Your blood sugar will be checked by the staff and you will probably be given insulin after surgery (instead of oral diabetes medicines) to make sure you have good blood sugar levels. The goal for blood sugar control after surgery is 80-180 mg/dL.   WHAT DO I DO ABOUT MY DIABETES MEDICATION?  Do not take oral diabetes medicines (pills) the morning of surgery.  THE DAY BEFORE SURGERY, take novolog insulin for breakfast and lunch as usual.Take ONLY half of the evening dose of novolog. Take insulin glargine as usual in the morning.DO NOT take farxiga.       THE MORNING OF SURGERY, Take ONLY half of the glargine insulin. The day of surgery, do not take other diabetes injectables, including Byetta (exenatide), Bydureon (exenatide ER), Victoza (liraglutide), or Trulicity (dulaglutide).  If your CBG is greater than 220 mg/dL, you may take  of your sliding scale  (correction) dose of insulin  DO NOT TAKE ANY ORAL DIABETIC MEDICATIONS DAY OF YOUR SURGERY                              You may not have any metal on your body including hair pins, jewelry, and body piercing             Do not wear make-up, lotions, powders, perfumes/cologne, or deodorant  Do not wear nail polish including gel and S&S, artificial/acrylic nails, or any other type of covering on natural nails including finger and toenails. If you have artificial nails, gel coating, etc. that needs to be removed by a nail salon please have this removed prior to surgery or surgery may need to be canceled/ delayed if the surgeon/ anesthesia feels like they are unable to be safely monitored.   Do not shave  48 hours prior to surgery.    Do not bring valuables to the hospital. [facility].   Contacts,  dentures or bridgework may not be worn into surgery.   Bring small overnight bag day of surgery.    Patients discharged on the day of surgery will not be allowed to drive home.   Special Instructions: Bring a copy of your healthcare power of attorney and living will documents         the day of surgery if you haven't scanned them before.              Please read over the following fact sheets you were given: IF YOU HAVE QUESTIONS ABOUT YOUR PRE-OP INSTRUCTIONS PLEASE CALL [phone]   [facility] - Preparing for Surgery Before surgery, you can play an important role.  Because skin is not sterile, your skin needs to be as free of germs as possible.  You can reduce the number of germs on your skin by washing with CHG (chlorahexidine gluconate) soap before surgery.  CHG is an antiseptic cleaner which kills germs and bonds with the skin to continue killing germs even after washing. Please DO NOT use if you have an allergy to CHG or antibacterial soaps.  If your skin becomes reddened/irritated stop using the CHG and inform your nurse when you arrive at Short Stay. Do not shave (including legs and underarms) for at least 48 hours prior to the first CHG shower.  You  may shave your face/neck. Please follow these instructions carefully:  1.  Shower with CHG Soap the night before surgery and the  morning of Surgery.  2.  If you choose to wash your hair, wash your hair first as usual with your  normal  shampoo.  3.  After you shampoo, rinse your hair and body thoroughly to remove the  shampoo.                           4.  Use CHG as you would any other liquid soap.  You can apply chg directly  to the skin and wash                       Gently with a scrungie or clean washcloth.  5.  Apply the CHG Soap to your body ONLY FROM THE NECK DOWN.   Do not use on face/ open                           Wound or open sores. Avoid contact with eyes, ears mouth and genitals (private parts).                       Wash  face,  Genitals (private parts) with your normal soap.             6.  Wash thoroughly, paying special attention to the area where your surgery  will be performed.  7.  Thoroughly rinse your body with warm water from the neck down.  8.  DO NOT shower/wash with your normal soap after using and rinsing off  the CHG Soap.                9.  Pat yourself dry with a clean towel.            10.  Wear clean pajamas.            11.  Place clean sheets on your bed the night of your first shower and do not  sleep with pets. Day of Surgery : Do not apply any lotions/deodorants the morning of surgery.  Please wear clean clothes to the hospital/surgery center.  FAILURE TO FOLLOW THESE INSTRUCTIONS MAY RESULT IN THE CANCELLATION OF YOUR SURGERY PATIENT SIGNATURE_________________________________  NURSE SIGNATURE__________________________________  ________________________________________________________________________

## 2021-07-03 ENCOUNTER — Encounter (HOSPITAL_COMMUNITY): Payer: Self-pay

## 2021-07-03 ENCOUNTER — Other Ambulatory Visit: Payer: Self-pay

## 2021-07-03 ENCOUNTER — Encounter: Payer: Self-pay | Admitting: Obstetrics and Gynecology

## 2021-07-03 ENCOUNTER — Encounter (HOSPITAL_COMMUNITY)
Admission: RE | Admit: 2021-07-03 | Discharge: 2021-07-03 | Disposition: A | Discharge status: Home / Self Care | Payer: 59 | Source: Ambulatory Visit | Attending: Obstetrics and Gynecology | Admitting: Obstetrics and Gynecology

## 2021-07-03 DIAGNOSIS — E119 Type 2 diabetes mellitus without complications: Secondary | ICD-10-CM | POA: Insufficient documentation

## 2021-07-03 DIAGNOSIS — Z7902 Long term (current) use of antithrombotics/antiplatelets: Secondary | ICD-10-CM | POA: Insufficient documentation

## 2021-07-03 DIAGNOSIS — Z794 Long term (current) use of insulin: Secondary | ICD-10-CM | POA: Diagnosis not present

## 2021-07-03 DIAGNOSIS — I10 Essential (primary) hypertension: Secondary | ICD-10-CM | POA: Diagnosis not present

## 2021-07-03 DIAGNOSIS — I2510 Atherosclerotic heart disease of native coronary artery without angina pectoris: Secondary | ICD-10-CM | POA: Diagnosis not present

## 2021-07-03 DIAGNOSIS — N813 Complete uterovaginal prolapse: Secondary | ICD-10-CM | POA: Diagnosis not present

## 2021-07-03 DIAGNOSIS — N8110 Cystocele, unspecified: Secondary | ICD-10-CM

## 2021-07-03 DIAGNOSIS — Z01812 Encounter for preprocedural laboratory examination: Secondary | ICD-10-CM | POA: Insufficient documentation

## 2021-07-03 DIAGNOSIS — Z7982 Long term (current) use of aspirin: Secondary | ICD-10-CM | POA: Diagnosis not present

## 2021-07-03 DIAGNOSIS — Z951 Presence of aortocoronary bypass graft: Secondary | ICD-10-CM | POA: Diagnosis not present

## 2021-07-03 LAB — BASIC METABOLIC PANEL
Anion gap: 8 (ref 5–15)
BUN: 24 mg/dL — ABNORMAL HIGH (ref 8–23)
CO2: 25 mmol/L (ref 22–32)
Calcium: 9.4 mg/dL (ref 8.9–10.3)
Chloride: 106 mmol/L (ref 98–111)
Creatinine, Ser: 1.07 mg/dL — ABNORMAL HIGH (ref 0.44–1.00)
GFR, Estimated: 59 mL/min — ABNORMAL LOW (ref >60)
Glucose, Bld: 201 mg/dL — ABNORMAL HIGH (ref 70–99)
Potassium: 4.3 mmol/L (ref 3.5–5.1)
Sodium: 139 mmol/L (ref 135–145)

## 2021-07-03 LAB — CBC WITH DIFFERENTIAL/PLATELET
Abs Immature Granulocytes: 0.03 10*3/uL (ref 0.00–0.07)
Basophils Absolute: 0.1 10*3/uL (ref 0.0–0.1)
Basophils Relative: 1 %
Eosinophils Absolute: 0.3 10*3/uL (ref 0.0–0.5)
Eosinophils Relative: 5 %
HCT: 36.8 % (ref 36.0–46.0)
Hemoglobin: 11.4 g/dL — ABNORMAL LOW (ref 12.0–15.0)
Immature Granulocytes: 0 %
Lymphocytes Relative: 23 %
Lymphs Abs: 1.6 10*3/uL (ref 0.7–4.0)
MCH: 24.7 pg — ABNORMAL LOW (ref 26.0–34.0)
MCHC: 31 g/dL (ref 30.0–36.0)
MCV: 79.7 fL — ABNORMAL LOW (ref 80.0–100.0)
Monocytes Absolute: 0.5 10*3/uL (ref 0.1–1.0)
Monocytes Relative: 7 %
Neutro Abs: 4.6 10*3/uL (ref 1.7–7.7)
Neutrophils Relative %: 64 %
Platelets: 180 10*3/uL (ref 150–400)
RBC: 4.62 MIL/uL (ref 3.87–5.11)
RDW: 19.5 % — ABNORMAL HIGH (ref 11.5–15.5)
WBC: 7.1 10*3/uL (ref 4.0–10.5)
nRBC: 0 % (ref 0.0–0.2)

## 2021-07-03 LAB — GLUCOSE, CAPILLARY: Glucose-Capillary: 220 mg/dL — ABNORMAL HIGH (ref 70–99)

## 2021-07-03 NOTE — Anesthesia Preprocedure Evaluation (Addendum)
Anesthesia Evaluation  Patient identified by MRN, date of birth, ID band Patient awake    Reviewed: Allergy & Precautions, NPO status , Patient's Chart, lab work & pertinent test results, reviewed documented beta blocker date and time   History of Anesthesia Complications Negative for: history of anesthetic complications  Airway Mallampati: II  TM Distance: >3 FB Neck ROM: Full    Dental  (+) Missing,    Pulmonary Current Smoker and Patient abstained from smoking.,    Pulmonary exam normal        Cardiovascular hypertension, Pt. on home beta blockers and Pt. on medications + CAD, + Past MI, + Cardiac Stents (2006) and + CABG (2019)  Normal cardiovascular exam     Neuro/Psych Anxiety Depression negative neurological ROS     GI/Hepatic negative GI ROS, Neg liver ROS,   Endo/Other  diabetes, Type 2, Insulin Dependent  Renal/GU negative Renal ROS  negative genitourinary   Musculoskeletal  (+) Arthritis ,   Abdominal   Peds  Hematology negative hematology ROS (+)   Anesthesia Other Findings Day of surgery medications reviewed with patient.  Reproductive/Obstetrics vaginal prolapse, stress urinary incontinence                           Anesthesia Physical Anesthesia Plan  ASA: 3  Anesthesia Plan: General   Post-op Pain Management:    Induction: Intravenous  PONV Risk Score and Plan: 4 or greater and Treatment may vary due to age or medical condition, Ondansetron, Dexamethasone and Midazolam  Airway Management Planned: Oral ETT  Additional Equipment: None  Intra-op Plan:   Post-operative Plan: Extubation in OR  Informed Consent: I have reviewed the patients History and Physical, chart, labs and discussed the procedure including the risks, benefits and alternatives for the proposed anesthesia with the patient or authorized representative who has indicated his/her understanding and  acceptance.     Dental advisory given  Plan Discussed with: CRNA  Anesthesia Plan Comments: (See PAT note 07/03/2021, Konrad Felix Ward, PA-C)      Anesthesia Quick Evaluation

## 2021-07-03 NOTE — Progress Notes (Addendum)
COVID Vaccine Completed: Yes Date COVID Vaccine completed: 07/31/20 COVID vaccine manufacturer: Pfizer  x 3    COVID Test: N/A  PCP - Dr. Betty Martinique. LOV: 06/07/21 Cardiologist - Dr. Lyman Bishop  Chest x-ray - 06/07/21 EKG - 11/26/20 Stress Test -  ECHO - 10/19/17 Cardiac Cath - 08/16/18 Pacemaker/ICD device last checked: A1C: 7.1: 06/07/21 Sleep Study -  CPAP -   Fasting Blood Sugar - 100's Checks Blood Sugar: continuous monitor.  Blood Thinner Instructions: Plavix on hold since 07/02/21 Aspirin Instructions: Last Dose:  Anesthesia review: Hx: Smoker,MI,CAD,HTN,DIA,CABG  Patient denies shortness of breath, fever, cough and chest pain at PAT appointment   Patient verbalized understanding of instructions that were given to them at the PAT appointment. Patient was also instructed that they will need to review over the PAT instructions again at home before surgery.

## 2021-07-03 NOTE — Progress Notes (Signed)
Anesthesia Chart Review   Case: [id number] Date/Time: 07/09/21 1245   Procedures:      HYSTERECTOMY VAGINAL with bilateral salpingo-oophorectomy, - total time requested for all procedures is 3 hours     VAGINAL VAULT SUSPENSION     ANTERIOR (CYSTOCELE) AND POSTERIOR REPAIR (RECTOCELE) with perineorrhaphy     URETHRAL BULKING     CYSTOSCOPY   Anesthesia type: General   Pre-op diagnosis: uterovaginal prolapse complete, anterior vaginal prolapse, posterior vaginal prolapse, stress urinary incontinence   Location: [facility] ROOM 07 / [facility]   Surgeons: [name], MD       DISCUSSION:60 y.o. some day smoker with h/o HTN, DM II (A1C 7.1), CAD (PCI to RCA 2006, CABG 09/2017), vaginal prolapse, stress urinary incontinence scheduled for above procedure 07/09/2021 with Dr. Sherlene Shams.   Pt last seen by cardiology 10/22/2020. Stable at this visit with 1 year follow up recommended.    Per cardiology note 05/21/2021 pt can hold Plavix 5 days prior to procedure.   Anticipate pt can proceed with planned procedure barring acute status change.   VS: BP 134/74   Pulse 66   Temp 36.9 C (Oral)   Ht 5' 6"  (1.676 m)   Wt 87.5 kg   SpO2 95%   BMI 31.15 kg/m   PROVIDERS: Martinique, Betty G, MD is PCP   Lyman Bishop, MD is Cardiologist  LABS: Labs reviewed: Acceptable for surgery. (all labs ordered are listed, but only abnormal results are displayed)  Labs Reviewed  BASIC METABOLIC PANEL - Abnormal; Notable for the following components:      Result Value   Glucose, Bld 201 (*)    BUN 24 (*)    Creatinine, Ser 1.07 (*)    GFR, Estimated 59 (*)    All other components within normal limits  CBC WITH DIFFERENTIAL/PLATELET - Abnormal; Notable for the following components:   Hemoglobin 11.4 (*)    MCV 79.7 (*)    MCH 24.7 (*)    RDW 19.5 (*)    All other components within normal limits  GLUCOSE, CAPILLARY - Abnormal; Notable for the following components:   Glucose-Capillary 220 (*)     All other components within normal limits  TYPE AND SCREEN     IMAGES:   EKG: 11/26/2020 Rate 72 bpm  Sinus rhythm Probable left atrial enlargement Anteroseptal infarct, age indeterminate No significant change was found  CV: Echo 10/19/2017 Septum: No Patent Foramen Ovale present.   Left atrium: Patent foramen ovale not present.   Mitral valve: No leaflet thickening and calcification present. Trace  regurgitation.   Right ventricle: Normal cavity size, wall thickness and ejection  fraction.   Pulmonic valve: Trace regurgitation. Past Medical History:  Diagnosis Date   Anxiety    Arthritis    "maybe in my left foot" (10/14/2017)   Bladder prolapse, female, acquired 09/14/2017   Coronary artery disease    Depression    High cholesterol    History of stomach ulcers    Hypertension    Myocardial infarction ([place]) 12/30/2004   Tobacco abuse    Type II diabetes mellitus ([place])     Past Surgical History:  Procedure Laterality Date   CARDIAC CATHETERIZATION  10/14/2017   CARPAL TUNNEL RELEASE Right 04/10/2021   Procedure: RIGHT CARPAL TUNNEL RELEASE;  Surgeon: Leandrew Koyanagi, MD;  Location: [facility];  Service: Orthopedics;  Laterality: Right;   CORONARY ANGIOPLASTY WITH STENT PLACEMENT  12/30/2004   Patient reported   CORONARY ARTERY  BYPASS GRAFT N/A 10/19/2017   Procedure: CORONARY ARTERY BYPASS GRAFTING (CABG) times four using the right saphaneous vein. Harvested endoscopicly and left internal mammary artery.;  Surgeon: Grace Isaac, MD;  Location: [facility];  Service: Open Heart Surgery;  Laterality: N/A;   DILATION AND CURETTAGE OF UTERUS  1980s   LEFT HEART CATH AND CORONARY ANGIOGRAPHY N/A 10/14/2017   Procedure: LEFT HEART CATH AND CORONARY ANGIOGRAPHY;  Surgeon: Martinique, Peter M, MD;  Location: [facility];  Service: Cardiovascular;  Laterality: N/A;   LEFT HEART CATH AND CORS/GRAFTS ANGIOGRAPHY N/A 01/01/2018   Procedure: LEFT HEART CATH AND CORS/GRAFTS  ANGIOGRAPHY;  Surgeon: Jettie Booze, MD;  Location: [facility];  Service: Cardiovascular;  Laterality: N/A;   LEFT HEART CATH AND CORS/GRAFTS ANGIOGRAPHY N/A 08/16/2018   Procedure: LEFT HEART CATH AND CORS/GRAFTS ANGIOGRAPHY;  Surgeon: Nelva Bush, MD;  Location: [facility];  Service: Cardiovascular;  Laterality: N/A;   PILONIDAL CYST EXCISION  1980s   TEE WITHOUT CARDIOVERSION N/A 10/19/2017   Procedure: TRANSESOPHAGEAL ECHOCARDIOGRAM (TEE);  Surgeon: Grace Isaac, MD;  Location: [facility];  Service: Open Heart Surgery;  Laterality: N/A;   TRIGGER FINGER RELEASE Right 04/10/2021   Procedure: RIGHT LONG FINGER TRIGGER RELEASE;  Surgeon: Leandrew Koyanagi, MD;  Location: [facility];  Service: Orthopedics;  Laterality: Right;    MEDICATIONS:  acetaminophen (TYLENOL) 500 MG tablet   amLODipine (NORVASC) 5 MG tablet   aspirin 81 MG EC tablet   buPROPion (WELLBUTRIN XL) 300 MG 24 hr tablet   clopidogrel (PLAVIX) 75 MG tablet   Continuous Blood Gluc Sensor (FREESTYLE LIBRE 14 DAY SENSOR) MISC   dapagliflozin propanediol (FARXIGA) 5 MG TABS tablet   Dulaglutide (TRULICITY) 1.5 JS/2.8BT SOPN   ibuprofen (ADVIL) 600 MG tablet   insulin aspart (NOVOLOG FLEXPEN) 100 UNIT/ML FlexPen   insulin glargine, 2 Unit Dial, (TOUJEO MAX SOLOSTAR) 300 UNIT/ML Solostar Pen   Insulin Pen Needle (ULTICARE MICRO PEN NEEDLES) 32G X 4 MM MISC   Iron, Ferrous Sulfate, 325 (65 Fe) MG TABS   isosorbide mononitrate (IMDUR) 120 MG 24 hr tablet   lisinopril (ZESTRIL) 5 MG tablet   loratadine (CLARITIN) 10 MG tablet   methocarbamol (ROBAXIN) 500 MG tablet   metoprolol tartrate (LOPRESSOR) 50 MG tablet   Multiple Vitamin (MULTIVITAMIN WITH MINERALS) TABS tablet   nitroGLYCERIN (NITROSTAT) 0.4 MG SL tablet   Omega-3 Fatty Acids (FISH OIL) 1200 MG CAPS   oxyCODONE (OXY IR/ROXICODONE) 5 MG immediate release tablet   oxyCODONE-acetaminophen (PERCOCET) 10-325 MG tablet   oxyCODONE-acetaminophen  (PERCOCET) 5-325 MG tablet   polyethylene glycol powder (GLYCOLAX/MIRALAX) 17 GM/SCOOP powder   rosuvastatin (CRESTOR) 40 MG tablet   sertraline (ZOLOFT) 100 MG tablet   traMADol (ULTRAM) 50 MG tablet   Turmeric 500 MG CAPS   varenicline (CHANTIX) 1 MG tablet   No current facility-administered medications for this encounter.    Konrad Felix Ward, PA-C [facility] [phone]

## 2021-07-04 NOTE — Telephone Encounter (Signed)
Victoria Eaton is a 61 y.o. female was contacted and notified to also stop the aspirin. Pt verbalized understanding.

## 2021-07-08 ENCOUNTER — Ambulatory Visit: Payer: 59 | Admitting: Dietician

## 2021-07-09 ENCOUNTER — Ambulatory Visit (HOSPITAL_COMMUNITY): Payer: 59 | Admitting: Certified Registered"

## 2021-07-09 ENCOUNTER — Telehealth: Payer: Self-pay | Admitting: Obstetrics and Gynecology

## 2021-07-09 ENCOUNTER — Ambulatory Visit (HOSPITAL_COMMUNITY)
Admission: RE | Admit: 2021-07-09 | Discharge: 2021-07-09 | Disposition: A | Discharge status: Home / Self Care | Payer: 59 | Attending: Obstetrics and Gynecology | Admitting: Obstetrics and Gynecology

## 2021-07-09 ENCOUNTER — Encounter (HOSPITAL_COMMUNITY)
Admission: RE | Disposition: A | Discharge status: Home / Self Care | Payer: Self-pay | Source: Home / Self Care | Attending: Obstetrics and Gynecology

## 2021-07-09 ENCOUNTER — Ambulatory Visit (HOSPITAL_COMMUNITY): Payer: 59 | Admitting: Physician Assistant

## 2021-07-09 ENCOUNTER — Encounter (HOSPITAL_COMMUNITY): Payer: Self-pay | Admitting: Obstetrics and Gynecology

## 2021-07-09 DIAGNOSIS — Z955 Presence of coronary angioplasty implant and graft: Secondary | ICD-10-CM | POA: Diagnosis not present

## 2021-07-09 DIAGNOSIS — N812 Incomplete uterovaginal prolapse: Secondary | ICD-10-CM | POA: Insufficient documentation

## 2021-07-09 DIAGNOSIS — N840 Polyp of corpus uteri: Secondary | ICD-10-CM | POA: Insufficient documentation

## 2021-07-09 DIAGNOSIS — N8110 Cystocele, unspecified: Secondary | ICD-10-CM | POA: Diagnosis not present

## 2021-07-09 DIAGNOSIS — Z7982 Long term (current) use of aspirin: Secondary | ICD-10-CM | POA: Insufficient documentation

## 2021-07-09 DIAGNOSIS — N393 Stress incontinence (female) (male): Secondary | ICD-10-CM | POA: Insufficient documentation

## 2021-07-09 DIAGNOSIS — F17210 Nicotine dependence, cigarettes, uncomplicated: Secondary | ICD-10-CM | POA: Diagnosis not present

## 2021-07-09 DIAGNOSIS — N813 Complete uterovaginal prolapse: Secondary | ICD-10-CM

## 2021-07-09 DIAGNOSIS — I252 Old myocardial infarction: Secondary | ICD-10-CM | POA: Diagnosis not present

## 2021-07-09 DIAGNOSIS — N816 Rectocele: Secondary | ICD-10-CM

## 2021-07-09 DIAGNOSIS — E119 Type 2 diabetes mellitus without complications: Secondary | ICD-10-CM | POA: Diagnosis not present

## 2021-07-09 DIAGNOSIS — Z794 Long term (current) use of insulin: Secondary | ICD-10-CM | POA: Diagnosis not present

## 2021-07-09 DIAGNOSIS — I10 Essential (primary) hypertension: Secondary | ICD-10-CM | POA: Diagnosis not present

## 2021-07-09 DIAGNOSIS — Z79899 Other long term (current) drug therapy: Secondary | ICD-10-CM | POA: Diagnosis not present

## 2021-07-09 DIAGNOSIS — E7800 Pure hypercholesterolemia, unspecified: Secondary | ICD-10-CM | POA: Insufficient documentation

## 2021-07-09 DIAGNOSIS — Z7902 Long term (current) use of antithrombotics/antiplatelets: Secondary | ICD-10-CM | POA: Insufficient documentation

## 2021-07-09 HISTORY — PX: ANTERIOR AND POSTERIOR REPAIR: SHX5121

## 2021-07-09 HISTORY — PX: CYSTOSCOPY: SHX5120

## 2021-07-09 HISTORY — PX: VAGINAL HYSTERECTOMY: SHX2639

## 2021-07-09 HISTORY — PX: VAGINAL PROLAPSE REPAIR: SHX830

## 2021-07-09 LAB — TYPE AND SCREEN
ABO/RH(D): A POS
Antibody Screen: NEGATIVE

## 2021-07-09 LAB — GLUCOSE, CAPILLARY
Glucose-Capillary: 161 mg/dL — ABNORMAL HIGH (ref 70–99)
Glucose-Capillary: 142 mg/dL — ABNORMAL HIGH (ref 70–99)
Glucose-Capillary: 169 mg/dL — ABNORMAL HIGH (ref 70–99)

## 2021-07-09 SURGERY — HYSTERECTOMY, VAGINAL
Anesthesia: General

## 2021-07-09 MED ORDER — LACTATED RINGERS IV SOLN: INTRAVENOUS | Status: DC

## 2021-07-09 MED ORDER — PHENYLEPHRINE 40 MCG/ML (10ML) SYRINGE FOR IV PUSH (FOR BLOOD PRESSURE SUPPORT)
PREFILLED_SYRINGE | INTRAVENOUS | Status: AC
  Filled 2021-07-09: qty 10

## 2021-07-09 MED ORDER — SODIUM CHLORIDE 0.9 % IR SOLN
Status: DC | PRN
  Administered 2021-07-09: 3000 mL via INTRAVESICAL

## 2021-07-09 MED ORDER — SUGAMMADEX SODIUM 200 MG/2ML IV SOLN
INTRAVENOUS | Status: DC | PRN
  Administered 2021-07-09: 200 mg via INTRAVENOUS

## 2021-07-09 MED ORDER — MIDAZOLAM HCL 2 MG/2ML IJ SOLN
INTRAMUSCULAR | Status: DC | PRN
  Administered 2021-07-09 (×2): 1 mg via INTRAVENOUS

## 2021-07-09 MED ORDER — PHENAZOPYRIDINE HCL 100 MG PO TABS
100.0000 mg | ORAL_TABLET | ORAL | Status: AC
  Administered 2021-07-09: 100 mg via ORAL
  Filled 2021-07-09: qty 1

## 2021-07-09 MED ORDER — FENTANYL CITRATE (PF) 100 MCG/2ML IJ SOLN
INTRAMUSCULAR | Status: DC | PRN
  Administered 2021-07-09: 25 ug via INTRAVENOUS

## 2021-07-09 MED ORDER — SUGAMMADEX SODIUM 500 MG/5ML IV SOLN
INTRAVENOUS | Status: AC
  Filled 2021-07-09: qty 5

## 2021-07-09 MED ORDER — ORAL CARE MOUTH RINSE: 15.0000 mL | Freq: Once | OROMUCOSAL | Status: AC

## 2021-07-09 MED ORDER — LIDOCAINE HCL (PF) 2 % IJ SOLN
INTRAMUSCULAR | Status: AC
  Filled 2021-07-09: qty 15

## 2021-07-09 MED ORDER — ONDANSETRON HCL 4 MG/2ML IJ SOLN
INTRAMUSCULAR | Status: DC | PRN
  Administered 2021-07-09 (×2): 4 mg via INTRAVENOUS

## 2021-07-09 MED ORDER — OXYCODONE HCL 5 MG PO TABS
ORAL_TABLET | ORAL | Status: AC
  Filled 2021-07-09: qty 1

## 2021-07-09 MED ORDER — CLINDAMYCIN PHOSPHATE 900 MG/50ML IV SOLN
900.0000 mg | INTRAVENOUS | Status: AC
  Administered 2021-07-09: 900 mg via INTRAVENOUS
  Filled 2021-07-09: qty 50

## 2021-07-09 MED ORDER — LIDOCAINE-EPINEPHRINE 1 %-1:100000 IJ SOLN
INTRAMUSCULAR | Status: DC | PRN
  Administered 2021-07-09: 39 mL

## 2021-07-09 MED ORDER — DEXAMETHASONE SODIUM PHOSPHATE 10 MG/ML IJ SOLN
INTRAMUSCULAR | Status: DC | PRN
  Administered 2021-07-09: 10 mg via INTRAVENOUS

## 2021-07-09 MED ORDER — CHLORHEXIDINE GLUCONATE 0.12 % MT SOLN
15.0000 mL | Freq: Once | OROMUCOSAL | Status: AC
  Administered 2021-07-09: 15 mL via OROMUCOSAL

## 2021-07-09 MED ORDER — PROPOFOL 10 MG/ML IV BOLUS
INTRAVENOUS | Status: DC | PRN
  Administered 2021-07-09: 20 mg via INTRAVENOUS
  Administered 2021-07-09: 140 mg via INTRAVENOUS

## 2021-07-09 MED ORDER — LIDOCAINE-EPINEPHRINE 1 %-1:100000 IJ SOLN
INTRAMUSCULAR | Status: AC
  Filled 2021-07-09: qty 1

## 2021-07-09 MED ORDER — FENTANYL CITRATE (PF) 250 MCG/5ML IJ SOLN
INTRAMUSCULAR | Status: AC
  Filled 2021-07-09: qty 5

## 2021-07-09 MED ORDER — EPHEDRINE 5 MG/ML INJ
INTRAVENOUS | Status: AC
  Filled 2021-07-09: qty 15

## 2021-07-09 MED ORDER — ROCURONIUM BROMIDE 10 MG/ML (PF) SYRINGE
PREFILLED_SYRINGE | INTRAVENOUS | Status: AC
  Filled 2021-07-09: qty 20

## 2021-07-09 MED ORDER — PROMETHAZINE HCL 25 MG/ML IJ SOLN: 6.2500 mg | INTRAMUSCULAR | Status: DC | PRN

## 2021-07-09 MED ORDER — FENTANYL CITRATE (PF) 250 MCG/5ML IJ SOLN
INTRAMUSCULAR | Status: DC | PRN
  Administered 2021-07-09 (×5): 50 ug via INTRAVENOUS

## 2021-07-09 MED ORDER — PROPOFOL 1000 MG/100ML IV EMUL
INTRAVENOUS | Status: AC
  Filled 2021-07-09: qty 200

## 2021-07-09 MED ORDER — FUROSEMIDE 10 MG/ML IJ SOLN
INTRAMUSCULAR | Status: AC
  Filled 2021-07-09: qty 2

## 2021-07-09 MED ORDER — DIPHENHYDRAMINE HCL 50 MG/ML IJ SOLN
INTRAMUSCULAR | Status: AC
  Filled 2021-07-09: qty 1

## 2021-07-09 MED ORDER — 0.9 % SODIUM CHLORIDE (POUR BTL) OPTIME
TOPICAL | Status: DC | PRN
  Administered 2021-07-09: 1000 mL

## 2021-07-09 MED ORDER — ONDANSETRON HCL 4 MG/2ML IJ SOLN
INTRAMUSCULAR | Status: AC
  Filled 2021-07-09: qty 6

## 2021-07-09 MED ORDER — FENTANYL CITRATE PF 50 MCG/ML IJ SOSY: 25.0000 ug | PREFILLED_SYRINGE | INTRAMUSCULAR | Status: DC | PRN

## 2021-07-09 MED ORDER — ENOXAPARIN SODIUM 40 MG/0.4ML IJ SOSY
40.0000 mg | PREFILLED_SYRINGE | INTRAMUSCULAR | Status: AC
  Administered 2021-07-09: 40 mg via SUBCUTANEOUS
  Filled 2021-07-09: qty 0.4

## 2021-07-09 MED ORDER — GENTAMICIN SULFATE 40 MG/ML IJ SOLN
5.0000 mg/kg | INTRAVENOUS | Status: AC
  Administered 2021-07-09: 353.2 mg via INTRAVENOUS
  Filled 2021-07-09: qty 8.75

## 2021-07-09 MED ORDER — ACETAMINOPHEN 10 MG/ML IV SOLN
INTRAVENOUS | Status: DC | PRN
  Administered 2021-07-09: 1000 mg via INTRAVENOUS

## 2021-07-09 MED ORDER — MIDAZOLAM HCL 2 MG/2ML IJ SOLN
INTRAMUSCULAR | Status: AC
  Filled 2021-07-09: qty 2

## 2021-07-09 MED ORDER — DEXAMETHASONE SODIUM PHOSPHATE 10 MG/ML IJ SOLN
INTRAMUSCULAR | Status: AC
  Filled 2021-07-09: qty 3

## 2021-07-09 MED ORDER — PHENYLEPHRINE HCL-NACL 20-0.9 MG/250ML-% IV SOLN
INTRAVENOUS | Status: DC | PRN
  Administered 2021-07-09: 35 ug/min via INTRAVENOUS

## 2021-07-09 MED ORDER — POVIDONE-IODINE 10 % EX SWAB
2.0000 "application " | Freq: Once | CUTANEOUS | Status: AC
  Administered 2021-07-09: 2 via TOPICAL

## 2021-07-09 MED ORDER — SUCCINYLCHOLINE CHLORIDE 200 MG/10ML IV SOSY
PREFILLED_SYRINGE | INTRAVENOUS | Status: AC
  Filled 2021-07-09: qty 20

## 2021-07-09 MED ORDER — PROPOFOL 10 MG/ML IV BOLUS
INTRAVENOUS | Status: AC
  Filled 2021-07-09: qty 20

## 2021-07-09 MED ORDER — OXYCODONE HCL 5 MG/5ML PO SOLN: 5.0000 mg | Freq: Once | ORAL | Status: AC | PRN

## 2021-07-09 MED ORDER — FENTANYL CITRATE (PF) 100 MCG/2ML IJ SOLN
INTRAMUSCULAR | Status: AC
  Filled 2021-07-09: qty 2

## 2021-07-09 MED ORDER — LIDOCAINE 2% (20 MG/ML) 5 ML SYRINGE
INTRAMUSCULAR | Status: DC | PRN
  Administered 2021-07-09: 100 mg via INTRAVENOUS

## 2021-07-09 MED ORDER — OXYCODONE HCL 5 MG PO TABS
5.0000 mg | ORAL_TABLET | Freq: Once | ORAL | Status: AC | PRN
Start: 2021-07-09 — End: 2021-07-09
  Administered 2021-07-09: 5 mg via ORAL

## 2021-07-09 MED ORDER — ALBUMIN HUMAN 5 % IV SOLN
INTRAVENOUS | Status: AC
  Filled 2021-07-09: qty 250

## 2021-07-09 MED ORDER — ACETAMINOPHEN 10 MG/ML IV SOLN
INTRAVENOUS | Status: AC
  Filled 2021-07-09: qty 100

## 2021-07-09 MED ORDER — LIDOCAINE-EPINEPHRINE (PF) 1 %-1:200000 IJ SOLN
INTRAMUSCULAR | Status: AC
  Filled 2021-07-09: qty 30

## 2021-07-09 MED ORDER — ROCURONIUM BROMIDE 10 MG/ML (PF) SYRINGE
PREFILLED_SYRINGE | INTRAVENOUS | Status: DC | PRN
  Administered 2021-07-09: 10 mg via INTRAVENOUS
  Administered 2021-07-09: 60 mg via INTRAVENOUS
  Administered 2021-07-09: 20 mg via INTRAVENOUS
  Administered 2021-07-09: 10 mg via INTRAVENOUS

## 2021-07-09 SURGICAL SUPPLY — 53 items
AGENT HMST KT MTR STRL THRMB (HEMOSTASIS)
BLADE CLIPPER SENSICLIP SURGIC (BLADE) ×2 IMPLANT
BLADE SURG 15 STRL LF DISP TIS (BLADE) ×1 IMPLANT
BLADE SURG 15 STRL SS (BLADE) ×2
CATH FOLEY 2WAY SLVR  5CC 12FR (CATHETERS) ×2
CATH FOLEY 2WAY SLVR 5CC 12FR (CATHETERS) IMPLANT
CATH ROBINSON RED A/P 14FR (CATHETERS) ×2 IMPLANT
COVER MAYO STAND STRL (DRAPES) ×3 IMPLANT
Coaptite 1ml syringe injectable implant ×2 IMPLANT
DECANTER SPIKE VIAL GLASS SM (MISCELLANEOUS) ×1 IMPLANT
DEVICE CAPIO SLIM SINGLE (INSTRUMENTS) IMPLANT
ELECT REM PT RETURN 15FT ADLT (MISCELLANEOUS) ×2 IMPLANT
GAUZE 4X4 16PLY ~~LOC~~+RFID DBL (SPONGE) ×4 IMPLANT
GLOVE SURG ENC MOIS LTX SZ6 (GLOVE) ×2 IMPLANT
GLOVE SURG UNDER POLY LF SZ6.5 (GLOVE) ×2 IMPLANT
GOWN STRL REUS W/ TWL LRG LVL3 (GOWN DISPOSABLE) ×1 IMPLANT
GOWN STRL REUS W/TWL LRG LVL3 (GOWN DISPOSABLE) ×4 IMPLANT
HIBICLENS CHG 4% 4OZ BTL (MISCELLANEOUS) ×2 IMPLANT
HOLDER FOLEY CATH W/STRAP (MISCELLANEOUS) ×2 IMPLANT
KIT TURNOVER KIT A (KITS) ×2 IMPLANT
MANIFOLD NEPTUNE II (INSTRUMENTS) ×2 IMPLANT
NDL ASPIRATION 22 (NEEDLE) IMPLANT
NDL MAYO 6 CRC TAPER PT (NEEDLE) ×1 IMPLANT
NEEDLE ASPIRATION 22 (NEEDLE) ×6 IMPLANT
NEEDLE HYPO 22GX1.5 SAFETY (NEEDLE) ×2 IMPLANT
NEEDLE MAYO 6 CRC TAPER PT (NEEDLE) ×2 IMPLANT
NS IRRIG 1000ML POUR BTL (IV SOLUTION) ×2 IMPLANT
PACK CYSTO (CUSTOM PROCEDURE TRAY) IMPLANT
PACK PERINEAL COLD (PAD) ×1 IMPLANT
PACK VAGINAL MINOR WOMEN LF (CUSTOM PROCEDURE TRAY) ×2 IMPLANT
PACK VAGINAL WOMENS (CUSTOM PROCEDURE TRAY) ×2 IMPLANT
PAD OB MATERNITY 4.3X12.25 (PERSONAL CARE ITEMS) ×2 IMPLANT
RETRACTOR LONE STAR DISPOSABLE (INSTRUMENTS) ×2 IMPLANT
RETRACTOR STAY HOOK 5MM (MISCELLANEOUS) ×5 IMPLANT
SET IRRIG Y TYPE TUR BLADDER L (SET/KITS/TRAYS/PACK) ×2 IMPLANT
SURGIFLO W/THROMBIN 8M KIT (HEMOSTASIS) IMPLANT
SUT ABS MONO DBL WITH NDL 48IN (SUTURE) IMPLANT
SUT PDS PLUS 0 (SUTURE) ×8
SUT PDS PLUS AB 0 CT-2 (SUTURE) ×4 IMPLANT
SUT VIC AB 0 CT1 18XCR BRD 8 (SUTURE) IMPLANT
SUT VIC AB 0 CT1 27 (SUTURE) ×6
SUT VIC AB 0 CT1 27XBRD ANBCTR (SUTURE) ×1 IMPLANT
SUT VIC AB 0 CT1 27XBRD ANTBC (SUTURE) IMPLANT
SUT VIC AB 0 CT1 27XCR 8 STRN (SUTURE) ×2 IMPLANT
SUT VIC AB 0 CT1 8-18 (SUTURE) ×2
SUT VIC AB 2-0 SH 27 (SUTURE)
SUT VIC AB 2-0 SH 27XBRD (SUTURE) IMPLANT
SUT VICRYL 2-0 SH 8X27 (SUTURE) ×3 IMPLANT
SYR 10ML LL (SYRINGE) ×2 IMPLANT
SYR BULB EAR ULCER 3OZ GRN STR (SYRINGE) ×2 IMPLANT
TOWEL OR 17X26 10 PK STRL BLUE (TOWEL DISPOSABLE) ×2 IMPLANT
TRAY FOLEY MTR SLVR 14FR STAT (SET/KITS/TRAYS/PACK) ×2 IMPLANT
UNDERPAD 30X36 HEAVY ABSORB (UNDERPADS AND DIAPERS) ×2 IMPLANT

## 2021-07-09 NOTE — Op Note (Signed)
Operative Note  Preoperative Diagnosis: anterior vaginal prolapse, posterior vaginal prolapse, uterovaginal prolapse, incomplete, and stress urinary incontinence  Postoperative Diagnosis: same  Procedures performed:  Total vaginal hysterectomy, uterosacral ligament suspension, anterior and posterior repair, urethral bulking (coaptite), cystoscopy  Implants: 19m Coaptite- [employer]  Attending Surgeon: MSherlene Shams MD  Anesthesia: General endotracheal  Findings: 1. Stage IV POP on vaginal exam   2. Normal appearing uterus and cervix  3. On cystoscopy, normal bladder and urethra without injury or lesion. Brisk bilateral ureteral efflux noted.   Specimens:  ID Type Source Tests Collected by Time Destination  1 : uterus and cervix GYN Uterus and Cervix SURGICAL PATHOLOGY SJaquita Folds MD 07/09/2021 1406     Estimated blood loss: 200 mL  IV fluids: 2000 mL  Urine output: 2970mL  Complications: none  Procedure in Detail: The patient was taken to the operating room where she was placed under anesthesia.  She was then placed in the dorsal lithotomy position with Allen stirrups and prepped and draped in the usual sterile fashion.  Care was taken to avoid hyperflexion or hyperextension of her lower extremities.    A self-retaining retractor was placed, and a foley catheter was placed.   The cervix was grasped with two single-tooth tenacula.  The cervix was injected circumferentially with 1% lidocaine with epinephrine. A 10 blade was used to incise circumferentially around the cervix.  The posterior vagina was grasped with a Kocher clamp. The Mayo scissors were used to enter the posterior cul-de-sac. Palpation confirmed peritoneal entry and no adhesions. The posterior peritoneum was affixed to the vaginal cuff with 0-Vicryl (this suture was used throughout unless otherwise specified) at the midline. A long right angle retractor was placed through the posterior  colpotomy. A Heaney clamp was then used to clamp the uterosacral ligaments on each side. These were cut and suture ligated using 0-Vicryl in a Heaney fashion, and tagged with hemostats. Anteriorly, the bladder was dissected off the pubocervical fascia using Metzenbaum scissors. A Deaver retractor was placed anteriorly to protect the bladder. The anterior peritoneal reflection was then identified, and incised with Metzenbaum scissors to create an anterior colpotomy. Palpation confirmed peritoneal entry and no adhesions. The Deaver was placed anteriorly to protect the bladder. The cardinal ligaments were clamped, cut, and suture ligated in a similar fashion on each side. The uterine arteries were clamped, cut, and suture ligated. The cornua were clamped, cut, free-tied and suture ligated. The uterus and cervix were handed off the field.  Inspection of the pedicles revealed excellent hemostasis.  The bilateral fallopian tubes could not be visualized. The ovaries were palpated bilateral and were normal size.  For the uterosacral ligament suspension (USLS), the bowel was packed away with a moistened lap pad. The posterior cuff edge was grasped with an Allis clamp.  The right and left uterosacral ligaments were identified visually and digitally. Two sutures of 0 PDS was placed through each uterosacral ligament towards its insertion site at the sacrum. These were tagged with hemostats. The packing was removed.  A 70-degree cystoscope was introduced, and 360-degree inspection revealed no trauma in the bladder, with bilateral ureteral efflux with tension on the uterosacral sutures.  The bladder was drained and the cystoscope was removed.  The Foley catheter was reinserted.    For the anterior repair, two Allis clamps were placed along the midline of the anterior vaginal wall.  1% lidocaine with epinephrine was injected into the vaginal mucosa.  A 15 blade scalpel was  used to incise the vaginal mucosa in the midline.  Allis clamps were placed along this incision and Metzenbaum scissors were used to sharply dissect the epithelium off of the vesicovaginal septum bilaterally to the level of the pubic rami. Anterior plication of the vesicovaginal septum was then performed using 2-0 Vicryl. The vaginal mucosal edges were trimmed and the incision reapproximated with 2-0 Vicryl in a running locked fashion. Hemostasis was noted. The uterosacral stitches were then attached to the posterior and anterior edges of the vaginal cuff on the ipsilateral sides, in a through and through fashion, using a free needle. Figure of eight sutures of 0-Vicryl were placed through the vaginal cuff and tied down. The uterosacral stitches were then tied down one at a time with cystoscopy performed in between. Good apical support was noted. The Foley catheter was removed. A 70-degree cystoscope was introduced, and 360-degree inspection revealed no trauma in the bladder. After the second suture was tied down, the left ureter did not efflux. After several minutes, the decision was made to cut the more distal PDS suture. The cystoscopy then confirmed ureteral efflux on the left.   The bladder was drained and the cystoscope was removed.  The Foley catheter was reinserted. The cuff at the site of the removed PDS suture was closed with a 0- vicryl interrupted suture.   Attention was then turned to the posterior vagina.  Two Allis clamps were in the midline of the posterior vaginal wall defect.  1% lidocaine with epinephrine was injected into the vaginal mucosa. A vertical incision was made between these clamps with a 15 blade scalpel and a diamond shaped area of epithelium was cut at the introitus.  The rectovaginal septum was then dissected off the vaginal mucosa bilaterally. The rectovaginal septum was then plicated with vertical mattress sutures of 2-0 Vicryl.  After placement of the first plication stitch two fingers were inserted into the vaginal to confirm  adequate caliber.  The last distal stitch incorporated the perineal body in a U stitch fashion.  After plication, the excess vaginal mucosa was trimmed and the vaginal mucosa was reapproximated using 2-0 Vicryl sutures. The perineal body was reapproximated using 0-0 Vicryl and the hymenal ring was redeveloped.  The perineal skin was closed with a 2-0 vicryl suture. The vagina was copiously irrigated.  Hemostasis was noted. A rectal examination was normal and confirmed no sutures within the rectum.  An injection cystoscope was inserted into the urethra.  Urethral mucosa was noted to be within normal limits. The needle was advanced and inserted into the urethral mucosa just distal to the urethral sphincter at the 9, 3 and 6 o'clock positions.  A total of 19m of Coaptite  was injected and good coaptation was noted. The cystoscope was removed and a 33F foley catheter was placed.  The patient tolerated the procedure well.  She was awakened from anesthesia and transferred to the recovery room in stable condition. All counts were correct x 2.

## 2021-07-09 NOTE — Discharge Instructions (Signed)
POST OPERATIVE INSTRUCTIONS  General Instructions Recovery (not bed rest) will last approximately 6 weeks Walking is encouraged, but refrain from strenuous exercise/ housework/ heavy lifting. No lifting >10lbs  Nothing in the vagina- NO intercourse, tampons or douching Bathing:  Do not submerge in water (NO swimming, bath, hot tub, etc) until after your postop visit. You can shower starting the day after surgery.  No driving until you are not taking narcotic pain medicine and until your pain is well enough controlled that you can slam on the breaks or make sudden movements if needed.   Taking your medications Please take your acetaminophen and ibuprofen on a schedule for the first 48 hours. Take 69m ibuprofen, then take 5065macetaminophen 3 hours later, then continue to alternate ibuprofen and acetaminophen. That way you are taking each type of medication every 6 hours. Take the prescribed narcotic (oxycodone, tramadol, etc) as needed, with a maximum being every 4 hours.  Take a stool softener daily to keep your stools soft and preventing you from straining. If you have diarrhea, you decrease your stool softener. This is explained more below. We have prescribed you Miralax. Restart blood thinners the day after surgery  Reasons to Call the Nurse (see last page for phone numbers) Heavy Bleeding (changing your pad every 1-2 hours) Persistent nausea/vomiting Fever (100.4 degrees or more) Incision problems (pus or other fluid coming out, redness, warmth, increased pain)  Things to Expect After Surgery Mild to Moderate pain is normal during the first day or two after surgery. If prescribed, take Ibuprofen or Tylenol first and use the stronger medicine for "break-through" pain. You can overlap these medicines because they work differently.   Constipation   To Prevent Constipation:  Eat a well-balanced diet including protein, grains, fresh fruit and vegetables.  Drink plenty of fluids. Walk  regularly.  Depending on specific instructions from your physician: take Miralax daily and additionally you can add a stool softener (colace/ docusate) and fiber supplement. Continue as long as you're on pain medications.   To Treat Constipation:  If you do not have a bowel movement in 2 days after surgery, you can take 2 Tbs of Milk of Magnesia 1-2 times a day until you have a bowel movement. If diarrhea occurs, decrease the amount or stop the laxative. If no results with Milk of Magnesia, you can drink a bottle of magnesium citrate which you can purchase over the counter.  Fatigue:  This is a normal response to surgery and will improve with time.  Plan frequent rest periods throughout the day.  Gas Pain:  This is very common but can also be very painful! Drink warm liquids such as herbal teas, bouillon or soup. Walking will help you pass more gas.  Mylicon or Gas-X can be taken over the counter.  Leaking Urine:  Varying amounts of leakage may occur after surgery.  This should improve with time. Your bladder needs at least 3 months to recover from surgery. If you leak after surgery, be sure to mention this to your doctor at your post-op visit. If you were taking medications for overactive bladder prior to surgery, be sure to restart the medications immediately after surgery.  Incisions: If you have incisions on your abdomen, the skin glue will dissolve on its own over time. It is ok to gently rinse with soap and water over these incisions but do not scrub.  Catheter Approximately 50% of patients are unable to urinate after surgery and need to go home with  a catheter. This allows your bladder to rest so it can return to full function. If you go home with a catheter, the office will call to set up a voiding trial a few days after surgery. For most patients, by this visit, they are able to urinate on their own. Long term catheter use is rare.   Return to Work  As work demands and recovery times vary  widely, it is hard to predict when you will want to return to work. If you have a desk job with no strenuous physical activity, and if you would like to return sooner than generally recommended, discuss this with your provider or call our office.   Post op concerns  For non-emergent issues, please call the Urogynecology Nurse. Please leave a message and someone will contact you within one business day.  You can also send a message through [place].   AFTER HOURS (After 5:00 PM and on weekends):  For urgent matters that cannot wait until the next business day. Call our office [phone] and connect to the doctor on call.  Please reserve this for important issues.   **FOR ANY TRUE EMERGENCY ISSUES CALL 911 OR GO TO THE NEAREST EMERGENCY ROOM.** Please inform our office or the doctor on call of any emergency.     APPOINTMENTS: Call [phone]

## 2021-07-09 NOTE — Anesthesia Procedure Notes (Signed)
Procedure Name: Intubation Date/Time: 07/09/2021 1:14 PM Performed by: Cynda Familia, CRNA Pre-anesthesia Checklist: Patient identified, Emergency Drugs available, Suction available and Patient being monitored Patient Re-evaluated:Patient Re-evaluated prior to induction Oxygen Delivery Method: Circle System Utilized Preoxygenation: Pre-oxygenation with 100% oxygen Induction Type: IV induction Ventilation: Mask ventilation without difficulty Laryngoscope Size: Miller and 2 Grade View: Grade II Tube type: Oral Number of attempts: 1 Airway Equipment and Method: Stylet Placement Confirmation: ETT inserted through vocal cords under direct vision, positive ETCO2 and breath sounds checked- equal and bilateral Secured at: 22 cm Tube secured with: Tape Dental Injury: Teeth and Oropharynx as per pre-operative assessment  Comments: Smooth IV induction Howze- intubation AM CRNA atraumatic-- teeth and mouth as preop -- bilat BS Howze

## 2021-07-09 NOTE — Transfer of Care (Signed)
Immediate Anesthesia Transfer of Care Note  Patient: Chelisa Hennen  Procedure(s) Performed: HYSTERECTOMY VAGINAL VAGINAL VAULT SUSPENSION ANTERIOR (CYSTOCELE) AND POSTERIOR REPAIR (RECTOCELE) with perineorrhaphy URETHRAL BULKING CYSTOSCOPY  Patient Location: PACU  Anesthesia Type:General  Level of Consciousness: sedated  Airway & Oxygen Therapy: Patient Spontanous Breathing and Patient connected to face mask oxygen  Post-op Assessment: Report given to RN and Post -op Vital signs reviewed and stable  Post vital signs: Reviewed and stable  Last Vitals:  Vitals Value Taken Time  BP 126/53 07/09/21 1710  Temp 36.4 C 07/09/21 1710  Pulse 64 07/09/21 1713  Resp 19 07/09/21 1713  SpO2 95 % 07/09/21 1713  Vitals shown include unvalidated device data.  Last Pain:  Vitals:   07/09/21 1134  TempSrc: Oral         Complications: No notable events documented.

## 2021-07-09 NOTE — Telephone Encounter (Signed)
[name] underwent TVH, USLS, A/P repair with perineorrhaphy, urethral bulking and cystoscopy on 07/09/21.   She passed her voiding trial.  343m was backfilled into the bladder Voided 3078m PVR by bladder scan was 14m18m  She was discharged without a catheter. Please call her for a routine post op check Thanks!  MicJaquita FoldsD

## 2021-07-09 NOTE — Anesthesia Procedure Notes (Signed)
Date/Time: 07/09/2021 5:05 PM Performed by: Cynda Familia, CRNA Oxygen Delivery Method: Simple face mask Placement Confirmation: positive ETCO2 and breath sounds checked- equal and bilateral Dental Injury: Teeth and Oropharynx as per pre-operative assessment

## 2021-07-09 NOTE — Interval H&P Note (Signed)
History and Physical Interval Note:  07/09/2021 12:32 PM  Victoria Eaton  has presented today for surgery, with the diagnosis of uterovaginal prolapse complete, anterior vaginal prolapse, posterior vaginal prolapse, stress urinary incontinence.  The various methods of treatment have been discussed with the patient and family. After consideration of risks, benefits and other options for treatment, the patient has consented to  Procedure(s) with comments: HYSTERECTOMY VAGINAL with bilateral salpingo-oophorectomy, (N/A) - total time requested for all procedures is 3 hours VAGINAL VAULT SUSPENSION (N/A) ANTERIOR (CYSTOCELE) AND POSTERIOR REPAIR (RECTOCELE) with perineorrhaphy (N/A) URETHRAL BULKING (N/A) CYSTOSCOPY (N/A) as a surgical intervention.   Vitals:   07/09/21 1134  BP: (!) 146/70  Pulse: 66  Resp: 16  Temp: 98.4 F (36.9 C)  SpO2: 98%    Gen: NAD CV: S1 S2 RRR Lungs: Clear to auscultation bilaterally Abd: soft, nontender   The patient's history has been reviewed, patient examined, no change in status, stable for surgery.  I have reviewed the patient's chart and labs.  Questions were answered to the patient's satisfaction.     [name]

## 2021-07-10 ENCOUNTER — Encounter (HOSPITAL_COMMUNITY): Payer: Self-pay | Admitting: Obstetrics and Gynecology

## 2021-07-10 NOTE — Anesthesia Postprocedure Evaluation (Signed)
Anesthesia Post Note  [name]  Procedure(s) Performed: HYSTERECTOMY VAGINAL VAGINAL VAULT SUSPENSION ANTERIOR (CYSTOCELE) AND POSTERIOR REPAIR (RECTOCELE) with perineorrhaphy URETHRAL BULKING CYSTOSCOPY     Patient location during evaluation: PACU Anesthesia Type: General Level of consciousness: awake and alert and oriented Pain management: pain level controlled Vital Signs Assessment: post-procedure vital signs reviewed and stable Respiratory status: spontaneous breathing, nonlabored ventilation and respiratory function stable Cardiovascular status: blood pressure returned to baseline Postop Assessment: no apparent nausea or vomiting Anesthetic complications: no   No notable events documented.        Marthenia Rolling

## 2021-07-11 LAB — SURGICAL PATHOLOGY

## 2021-07-12 ENCOUNTER — Ambulatory Visit: Payer: 59 | Admitting: Family Medicine

## 2021-07-12 ENCOUNTER — Encounter: Payer: Self-pay | Admitting: *Deleted

## 2021-07-12 ENCOUNTER — Other Ambulatory Visit: Payer: Self-pay | Admitting: Family Medicine

## 2021-07-12 DIAGNOSIS — F32A Depression, unspecified: Secondary | ICD-10-CM

## 2021-07-12 DIAGNOSIS — F419 Anxiety disorder, unspecified: Secondary | ICD-10-CM

## 2021-07-12 DIAGNOSIS — F3341 Major depressive disorder, recurrent, in partial remission: Secondary | ICD-10-CM

## 2021-07-12 NOTE — Telephone Encounter (Signed)
Post- Op Call  Victoria Eaton underwent TVH, USLS, A/P repair with perineorrhaphy, urethral bulking on 07/09/21 with Dr Wannetta Sender. The patient reports that her pain is controlled. She states that she has the most pain in the middle of the night. Advised to take ibuprofen before going to bed. She is taking tylenol and oxycodone occasionally. She reports vaginal bleeding that is decreasing. She has had a bowel movement and is taking miralax for a bowel regimen. She was discharged without a catheter. Reports no problems with voiding. Advised to call us with any changes or if pain doesn't improve or worsens. Pt verbalized understanding.   Blenda Nicely, RN

## 2021-07-19 ENCOUNTER — Encounter: Payer: Self-pay | Admitting: Obstetrics and Gynecology

## 2021-07-19 ENCOUNTER — Other Ambulatory Visit: Payer: Self-pay | Admitting: Obstetrics and Gynecology

## 2021-07-19 DIAGNOSIS — N3000 Acute cystitis without hematuria: Secondary | ICD-10-CM

## 2021-07-19 MED ORDER — SULFAMETHOXAZOLE-TRIMETHOPRIM 800-160 MG PO TABS: 1.0000 | ORAL_TABLET | Freq: Two times a day (BID) | ORAL | 0 refills | Status: AC

## 2021-07-19 NOTE — Progress Notes (Signed)
Treated for UTI with bactrim DS BID x3 days.

## 2021-08-12 NOTE — Progress Notes (Deleted)
[facility]  Date of Visit: 08/13/2021  History of Present Illness: Victoria Eaton is a 60 y.o. female scheduled today for a post-operative visit.   Surgery: s/p TVH, USLS, A/P repair with perineorrhaphy, urethral bulking on 07/09/21  She passed her postoperative void trial.   Postoperatively, she was treated for a UTI with bactrim DS>   Today she reports ***  UTI in the last 6 weeks? Yes  Pain? {YES/NO:21197} She {HAS HAS RKY:70623} returned to her normal activity (except for postop restrictions) Vaginal bulge? {YES/NO:21197} Stress incontinence: {YES/NO:21197} Urgency/frequency: {YES/NO:21197} Urge incontinence: {YES/NO:21197} Voiding dysfunction: {YES/NO:21197} Bowel issues: {YES/NO:21197}  Subjective Success: Do you usually have a bulge or something falling out that you can see or feel in the vaginal area? {YES/NO:21197} Retreatment Success: Any retreatment with surgery or pessary for any compartment? {YES/NO:21197}  Pathology results: ***  Medications: She has a current medication list which includes the following prescription(s): acetaminophen, amlodipine, aspirin, bupropion, clopidogrel, freestyle libre 14 day sensor, dapagliflozin propanediol, trulicity, ibuprofen, novolog flexpen, toujeo max solostar, ulticare micro pen needles, iron (ferrous sulfate), isosorbide mononitrate, lisinopril, loratadine, methocarbamol, metoprolol tartrate, multivitamin with minerals, nitroglycerin, fish oil, oxycodone, polyethylene glycol powder, rosuvastatin, sertraline, and turmeric.   Allergies: Patient is allergic to [place] [sitagliptin], metformin and related, and penicillins.   Physical Exam: There were no vitals taken for this visit.  Abdomen: {Exam; abdomen:14935::"soft, non-tender, without masses or organomegaly"} ***Incisions: {Exam; incision:13523}.  Pelvic Examination: Vagina: Incisions {Exam; incision:13523}. Sutures are {DESC; PRESENT/NOT PRESENT:21021351} at  incision line and there {ACTION; IS/IS JSE:83151761} granulation tissue. No tenderness along the anterior or posterior vagina. No apical tenderness. No pelvic masses. ***No visible or palpable mesh.  POP-Q: No flowsheet data found. ---------------------------------------------------------  Assessment and Plan: No diagnosis found.  - ***Pathology results were reviewed with the patient today and she verbalized understanding that the results were ***benign.  - ***The patient was given a copy of her operative note ***and pathology report*** for her records. - Can resume regular activity including exercise and intercourse,  if desired.  - Discussed avoidance of heavy lifting and straining long term to reduce the risk of recurrance.   All questions answered.   No follow-ups on file.

## 2021-08-13 ENCOUNTER — Encounter: Payer: Self-pay | Admitting: Obstetrics and Gynecology

## 2021-08-19 ENCOUNTER — Other Ambulatory Visit: Payer: Self-pay | Admitting: Family Medicine

## 2021-08-19 DIAGNOSIS — I10 Essential (primary) hypertension: Secondary | ICD-10-CM

## 2021-08-20 ENCOUNTER — Ambulatory Visit: Payer: 59 | Admitting: Orthopaedic Surgery

## 2021-08-23 ENCOUNTER — Encounter: Payer: 59 | Admitting: Obstetrics and Gynecology

## 2021-08-23 ENCOUNTER — Ambulatory Visit: Payer: 59 | Admitting: Orthopaedic Surgery

## 2021-08-27 ENCOUNTER — Ambulatory Visit (INDEPENDENT_AMBULATORY_CARE_PROVIDER_SITE_OTHER): Payer: 59

## 2021-08-27 ENCOUNTER — Ambulatory Visit (INDEPENDENT_AMBULATORY_CARE_PROVIDER_SITE_OTHER): Payer: 59 | Admitting: Orthopaedic Surgery

## 2021-08-27 ENCOUNTER — Ambulatory Visit: Payer: Self-pay

## 2021-08-27 ENCOUNTER — Other Ambulatory Visit: Payer: Self-pay

## 2021-08-27 DIAGNOSIS — M25552 Pain in left hip: Secondary | ICD-10-CM | POA: Diagnosis not present

## 2021-08-27 DIAGNOSIS — M25551 Pain in right hip: Secondary | ICD-10-CM

## 2021-08-27 NOTE — Progress Notes (Signed)
Office Visit Note   [name]           Date of Birth: 07/07/1961           MRN: [id number] Visit Date: 08/27/2021              Requested by: Eaton, Victoria G, MD [address] [place],  [place] [address] PCP: Eaton, Victoria G, MD   Assessment & Plan: Visit Diagnoses:  1. Bilateral hip pain     Plan: Impression is bilateral hip pain.  Her x-rays show mild arthritis without significant degenerative changes.  My impression is that the pain is coming from her hips rather than her back.  I do recommend physical therapy for 6 weeks and she will continue take Mobic.  She should follow-up if she continues to be symptomatic after physical therapy.  Follow-Up Instructions: No follow-ups on file.   Orders:  Orders Placed This Encounter  Procedures   XR HIP UNILAT W OR W/O PELVIS 2-3 VIEWS LEFT   XR HIP UNILAT W OR W/O PELVIS 2-3 VIEWS RIGHT   Ambulatory referral to Physical Therapy   No orders of the defined types were placed in this encounter.     Procedures: No procedures performed   Clinical Data: No additional findings.   Subjective: Chief Complaint  Patient presents with   Left Hip - Pain   Right Hip - Pain    [name] comes in today for evaluation of bilateral hip pain worse on the right side.  She underwent a hysterectomy in October.  She feels pain deep down in the joint.  She does feel some groin pain that wraps around from the lateral side.  Endorses a subjective giving way.  Denies any radicular symptoms but she does have chronic low back pain.  She takes oxycodone on a daily basis.   Review of Systems  Constitutional: Negative.   HENT: Negative.    Eyes: Negative.   Respiratory: Negative.    Cardiovascular: Negative.   Endocrine: Negative.   Musculoskeletal: Negative.   Neurological: Negative.   Hematological: Negative.   Psychiatric/Behavioral: Negative.    All other systems reviewed and are negative.   Objective: Vital Signs: There  were no vitals taken for this visit.  Physical Exam Vitals and nursing note reviewed.  Constitutional:      Appearance: She is well-developed.  Pulmonary:     Effort: Pulmonary effort is normal.  Skin:    General: Skin is warm.     Capillary Refill: Capillary refill takes less than 2 seconds.  Neurological:     Mental Status: She is alert and oriented to person, place, and time.  Psychiatric:        Behavior: Behavior normal.        Thought Content: Thought content normal.        Judgment: Judgment normal.    Ortho Exam  Bilateral hips show relatively well-preserved range of motion.  Hips are slightly tender to the lateral portion.  Mild pain with logroll and FADIR test.  Specialty Comments:  No specialty comments available.  Imaging: XR HIP UNILAT W OR W/O PELVIS 2-3 VIEWS LEFT  Result Date: 08/27/2021 Mild hip joint space narrowing.   XR HIP UNILAT W OR W/O PELVIS 2-3 VIEWS RIGHT  Result Date: 08/27/2021 Mild hip joint space narrowing    PMFS History: Patient Active Problem List   Diagnosis Date Noted   Carpal tunnel syndrome on right    Trigger finger, right  middle finger    Complete uterine prolapse 03/09/2021   Non-ST elevation (NSTEMI) myocardial infarction ([place]) 02/17/2018   Type 2 diabetes mellitus with complication, with long-term current use of insulin ([place]) 02/17/2018   Chest pain 01/01/2018   Unstable angina ([place]) 01/01/2018   Arm contusion, left, initial encounter 11/25/2017   Contusion of right knee 11/25/2017   Hx of CABG 10/19/2017   Epigastric pain    Pancreatitis 10/10/2017   Hyperlipidemia LDL goal <70 09/14/2017   Essential hypertension 05/01/2017   Major depression, recurrent ([place]) 05/01/2017   Tobacco abuse 05/01/2017   History of MI (myocardial infarction) 05/01/2017   Allergic rhinitis 05/01/2017   History of arthritis 05/01/2017   Anxiety disorder, unspecified 05/01/2017   Bladder prolapse, female, acquired 11/09/2013   Past  Medical History:  Diagnosis Date   Anxiety    Arthritis    "maybe in my left foot" (10/14/2017)   Bladder prolapse, female, acquired 09/14/2017   Coronary artery disease    Depression    High cholesterol    History of stomach ulcers    Hypertension    Myocardial infarction ([place]) 12/30/2004   Tobacco abuse    Type II diabetes mellitus ([place])     Family History  Problem Relation Age of Onset   Heart disease Father    Hyperlipidemia Father    Hypertension Father     Past Surgical History:  Procedure Laterality Date   ANTERIOR AND POSTERIOR REPAIR N/A 07/09/2021   Procedure: ANTERIOR (CYSTOCELE) AND POSTERIOR REPAIR (RECTOCELE) with perineorrhaphy;  Surgeon: [name], MD;  [facility]: [facility];  Service: Gynecology;  Laterality: N/A;   CARDIAC CATHETERIZATION  10/14/2017   CARPAL TUNNEL RELEASE Right 04/10/2021   Procedure: RIGHT CARPAL TUNNEL RELEASE;  Surgeon: Leandrew Koyanagi, MD;  Location: [facility];  Service: Orthopedics;  Laterality: Right;   CORONARY ANGIOPLASTY WITH STENT PLACEMENT  12/30/2004   Patient reported   CORONARY ARTERY BYPASS GRAFT N/A 10/19/2017   Procedure: CORONARY ARTERY BYPASS GRAFTING (CABG) times four using the right saphaneous vein. Harvested endoscopicly and left internal mammary artery.;  Surgeon: Grace Isaac, MD;  Location: [facility];  Service: Open Heart Surgery;  Laterality: N/A;   CYSTOSCOPY N/A 07/09/2021   Procedure: CYSTOSCOPY;  Surgeon: [name], MD;  [facility];  Service: Gynecology;  Laterality: N/A;   DILATION AND CURETTAGE OF UTERUS  1980s   LEFT HEART CATH AND CORONARY ANGIOGRAPHY N/A 10/14/2017   Procedure: LEFT HEART CATH AND CORONARY ANGIOGRAPHY;  Surgeon: Eaton, Peter M, MD;  Location: [facility];  Service: Cardiovascular;  Laterality: N/A;   LEFT HEART CATH AND CORS/GRAFTS ANGIOGRAPHY N/A 01/01/2018   Procedure: LEFT HEART CATH AND CORS/GRAFTS ANGIOGRAPHY;  Surgeon: Jettie Booze, MD;  Location: [facility];  Service: Cardiovascular;  Laterality: N/A;   LEFT HEART CATH AND CORS/GRAFTS ANGIOGRAPHY N/A 08/16/2018   Procedure: LEFT HEART CATH AND CORS/GRAFTS ANGIOGRAPHY;  Surgeon: Nelva Bush, MD;  Location: [facility];  Service: Cardiovascular;  Laterality: N/A;   PILONIDAL CYST EXCISION  1980s   TEE WITHOUT CARDIOVERSION N/A 10/19/2017   Procedure: TRANSESOPHAGEAL ECHOCARDIOGRAM (TEE);  Surgeon: Grace Isaac, MD;  Location: [facility];  Service: Open Heart Surgery;  Laterality: N/A;   TRIGGER FINGER RELEASE Right 04/10/2021   Procedure: RIGHT LONG FINGER TRIGGER RELEASE;  Surgeon: Leandrew Koyanagi, MD;  Location: [facility];  Service: Orthopedics;  Laterality: Right;   VAGINAL HYSTERECTOMY N/A 07/09/2021   Procedure:  HYSTERECTOMY VAGINAL;  Surgeon: [name], MD;  [facility];  Service: Gynecology;  Laterality: N/A;  total time requested for all procedures is 3 hours   VAGINAL PROLAPSE REPAIR N/A 07/09/2021   Procedure: VAGINAL VAULT SUSPENSION;  Surgeon: [name], MD;  [facility];  Service: Gynecology;  Laterality: N/A;   Social History   Occupational History   Not on file  Tobacco Use   Smoking status: Some Days    Packs/day: 0.20    Years: 45.00    Pack years: 9.00    Types: Cigarettes    Start date: 05/01/1972    Last attempt to quit: 09/30/2017    Years since quitting: 3.9   Smokeless tobacco: Never  Vaping Use   Vaping Use: Never used  Substance and Sexual Activity   Alcohol use: Yes    Alcohol/week: 7.0 standard drinks    Types: 7 Standard drinks or equivalent per week    Comment: daily: 2-3 wine   Drug use: No   Sexual activity: Not Currently

## 2021-08-29 ENCOUNTER — Ambulatory Visit: Payer: 59 | Admitting: Physical Therapy

## 2021-09-03 ENCOUNTER — Encounter: Payer: Self-pay | Admitting: Physical Therapy

## 2021-09-03 ENCOUNTER — Other Ambulatory Visit: Payer: Self-pay

## 2021-09-03 ENCOUNTER — Ambulatory Visit (INDEPENDENT_AMBULATORY_CARE_PROVIDER_SITE_OTHER): Payer: 59 | Admitting: Physical Therapy

## 2021-09-03 DIAGNOSIS — M25551 Pain in right hip: Secondary | ICD-10-CM

## 2021-09-03 DIAGNOSIS — M6281 Muscle weakness (generalized): Secondary | ICD-10-CM | POA: Diagnosis not present

## 2021-09-03 DIAGNOSIS — R2689 Other abnormalities of gait and mobility: Secondary | ICD-10-CM

## 2021-09-03 DIAGNOSIS — M25552 Pain in left hip: Secondary | ICD-10-CM | POA: Diagnosis not present

## 2021-09-03 NOTE — Therapy (Signed)
[facility] [address] [place], [place], [address] Phone: [phone]   Fax:  [phone]  Physical Therapy Evaluation  Patient Details  Name: Victoria Eaton MRN: [id number] Date of Birth: December 21, 1960 Referring Provider (PT): Leandrew Koyanagi, MD   Encounter Date: 09/03/2021   PT End of Session - 09/03/21 1427     Visit Number 1    Number of Visits 12    Date for PT Re-Evaluation 10/15/21    Authorization Type [employer] 20% coinsurance    PT Start Time 1430    PT Stop Time 1505    PT Time Calculation (min) 35 min    Activity Tolerance Patient tolerated treatment well    Behavior During Therapy [facility] for tasks assessed/performed             Past Medical History:  Diagnosis Date   Anxiety    Arthritis    "maybe in my left foot" (10/14/2017)   Bladder prolapse, female, acquired 09/14/2017   Coronary artery disease    Depression    High cholesterol    History of stomach ulcers    Hypertension    Myocardial infarction ([place]) 12/30/2004   Tobacco abuse    Type II diabetes mellitus ([place])     Past Surgical History:  Procedure Laterality Date   ANTERIOR AND POSTERIOR REPAIR N/A 07/09/2021   Procedure: ANTERIOR (CYSTOCELE) AND POSTERIOR REPAIR (RECTOCELE) with perineorrhaphy;  Surgeon: [name], MD;  [facility];  Service: Gynecology;  Laterality: N/A;   CARDIAC CATHETERIZATION  10/14/2017   CARPAL TUNNEL RELEASE Right 04/10/2021   Procedure: RIGHT CARPAL TUNNEL RELEASE;  Surgeon: Leandrew Koyanagi, MD;  Location: [facility];  Service: Orthopedics;  Laterality: Right;   CORONARY ANGIOPLASTY WITH STENT PLACEMENT  12/30/2004   Patient reported   CORONARY ARTERY BYPASS GRAFT N/A 10/19/2017   Procedure: CORONARY ARTERY BYPASS GRAFTING (CABG) times four using the right saphaneous vein. Harvested endoscopicly and left internal mammary artery.;  Surgeon: Grace Isaac, MD;  Location: [facility];  Service: Open Heart Surgery;   Laterality: N/A;   CYSTOSCOPY N/A 07/09/2021   Procedure: CYSTOSCOPY;  Surgeon: [name], MD;  [facility];  Service: Gynecology;  Laterality: N/A;   DILATION AND CURETTAGE OF UTERUS  1980s   LEFT HEART CATH AND CORONARY ANGIOGRAPHY N/A 10/14/2017   Procedure: LEFT HEART CATH AND CORONARY ANGIOGRAPHY;  Surgeon: [place], Peter M, MD;  Location: [facility];  Service: Cardiovascular;  Laterality: N/A;   LEFT HEART CATH AND CORS/GRAFTS ANGIOGRAPHY N/A 01/01/2018   Procedure: LEFT HEART CATH AND CORS/GRAFTS ANGIOGRAPHY;  Surgeon: Jettie Booze, MD;  Location: [facility];  Service: Cardiovascular;  Laterality: N/A;   LEFT HEART CATH AND CORS/GRAFTS ANGIOGRAPHY N/A 08/16/2018   Procedure: LEFT HEART CATH AND CORS/GRAFTS ANGIOGRAPHY;  Surgeon: Nelva Bush, MD;  Location: [facility];  Service: Cardiovascular;  Laterality: N/A;   PILONIDAL CYST EXCISION  1980s   TEE WITHOUT CARDIOVERSION N/A 10/19/2017   Procedure: TRANSESOPHAGEAL ECHOCARDIOGRAM (TEE);  Surgeon: Grace Isaac, MD;  Location: [facility];  Service: Open Heart Surgery;  Laterality: N/A;   TRIGGER FINGER RELEASE Right 04/10/2021   Procedure: RIGHT LONG FINGER TRIGGER RELEASE;  Surgeon: Leandrew Koyanagi, MD;  Location: [facility];  Service: Orthopedics;  Laterality: Right;   VAGINAL HYSTERECTOMY N/A 07/09/2021   Procedure: HYSTERECTOMY VAGINAL;  Surgeon: [name], MD;  [facility];  Service: Gynecology;  Laterality: N/A;  total  time requested for all procedures is 3 hours   VAGINAL PROLAPSE REPAIR N/A 07/09/2021   Procedure: VAGINAL VAULT SUSPENSION;  Surgeon: [name], MD;  [facility];  Service: Gynecology;  Laterality: N/A;    There were no vitals filed for this visit.    Subjective Assessment - 09/03/21 1425     Subjective Pt is a 60 y/o female who presents to OPPT bilateral hip pain worse on the right side x 1 month.  She underwent a hysterectomy in October,  and denies any specific injury.  She feels pain deep down in the joint.  She does feel some groin pain that wraps around from the lateral side.    Pertinent History chronic LBP, CABG,MI,DM, hysterectomy Oct 2022    Limitations Sitting;Standing;Walking    How long can you sit comfortably? 30 min    How long can you stand comfortably? 15 min    How long can you walk comfortably? 20 min    Diagnostic tests x-rays "show mild arthritis without significant degenerative changes"    Patient Stated Goals improve pain    Currently in Pain? Yes    Pain Score 1    up to 12/10; at best 0/10   Pain Location Hip    Pain Orientation Right;Left    Pain Descriptors / Indicators Aching;Dull;Sharp;Burning;Spasm    Pain Type Acute pain    Pain Onset More than a month ago    Pain Frequency Intermittent    Aggravating Factors  prolonged static positioning, stairs, hills (up>down)    Pain Relieving Factors medication (tylenol, tramadol, robaxin, oxy)                [facility] PT Assessment - 09/03/21 1426       Assessment   Medical Diagnosis M25.551,M25.552 (ICD-10-CM) - Bilateral hip pain    Referring Provider (PT) Leandrew Koyanagi, MD    Onset Date/Surgical Date --   1 month   Hand Dominance Right    Next MD Visit PRN    Prior Therapy after heart surgery (feb 2019); arm/shoulder 2014      Precautions   Precautions None      Restrictions   Weight Bearing Restrictions No      Balance Screen   Has the patient fallen in the past 6 months No   maybe 1-2; has had a couple but close to 6 months ago   Has the patient had a decrease in activity level because of a fear of falling?  No    Is the patient reluctant to leave their home because of a fear of falling?  No      Home Environment   Living Environment Private residence    Living Arrangements Other relatives   sister   Type of [place] to enter    Entrance Stairs-Number of Steps 3    Victoria Eaton One level   stairs to attic   Additional Comments has been going up/down stairs to attic more recently due to [employer]      Prior Function   Level of Independence Independent    Vocation Self employed    Engineer, civil (consulting)    Leisure gardening; no regular exercise (plans to join the [employer] and do some swimming)      Cognition   Overall Cognitive Status Within Functional Limits for tasks assessed      Observation/Other Assessments  Focus on Therapeutic Outcomes (FOTO)  31 (predicted 49)      Posture/Postural Control   Posture/Postural Control Postural limitations    Postural Limitations Rounded Shoulders;Forward head      ROM / Strength   AROM / PROM / Strength AROM;Strength      Strength   Strength Assessment Site Hip;Knee    Right/Left Hip Right;Left    Right Hip Flexion 3/5    Right Hip Extension 3/5    Right Hip External Rotation  4/5    Right Hip Internal Rotation 4/5    Right Hip ABduction 3/5    Left Hip Flexion 4/5    Left Hip Extension 3/5    Left Hip External Rotation 3+/5    Left Hip Internal Rotation 5/5    Left Hip ABduction 3/5    Right/Left Knee --      Palpation   Palpation comment tenderness along bil TFL, glute med/min and piriformis      Special Tests    Special Tests Hip Special Tests    Hip Special Tests  Saralyn Pilar (FABER) Test      Saralyn Pilar [facility]) Test   Findings Positive    Side --   bil   Comments Rt > Lt      Ambulation/Gait   Gait Comments mild trendelenburg                        Objective measurements completed on examination: See above findings.       [facility] Adult PT Treatment/Exercise - 09/03/21 1427       Exercises   Exercises Other Exercises    Other Exercises  see pt instructions - performed PRN for understanding                     PT Education - 09/03/21 1427     Education Details HEP    Person(s) Educated Patient    Methods Explanation;Demonstration;Handout     Comprehension Verbalized understanding;Returned demonstration;Need further instruction              PT Short Term Goals - 09/03/21 1428       PT SHORT TERM GOAL #1   Title independent with initial HEP    Time 3    Period Weeks    Status New    Target Date 09/24/21               PT Long Term Goals - 09/03/21 1428       PT LONG TERM GOAL #1   Title independent with final HEP    Time 6    Period Weeks    Status New    Target Date 10/15/21      PT LONG TERM GOAL #2   Title report pain < 3/10 with activity for improved function    Time 6    Period Weeks    Status New    Target Date 10/15/21      PT LONG TERM GOAL #3   Title FOTO score improved to 49 for improved function    Time 6    Period Weeks    Status New    Target Date 10/15/21      PT LONG TERM GOAL #4   Title demonstrate 4/5 bil hip strength for improved function    Time 6    Period Weeks    Status New    Target Date 10/15/21  Plan - 09/03/21 1508     Clinical Impression Statement Emelyn comes in today for evaluation of bilateral hip pain worse on the right side.  She underwent a hysterectomy in October. She demonstrates decreased strength and flexibility with trigger points noted.  Recent hysterectomy likely impacting core and hip strength as well.   Will benefit from PT to address deficits listed.    Personal Factors and Comorbidities Comorbidity 3+    Comorbidities chronic LBP, CABG,MI,DM, hysterectomy Oct 2022    Examination-Activity Limitations Locomotion Level;Transfers;Sit;Squat;Stairs;Stand;Lift    Examination-Participation Restrictions Cleaning;Meal Prep;Community Activity;Occupation;Driving;Shop;Yard Work    Stability/Clinical Decision Making Evolving/Moderate complexity    Clinical Decision Making Moderate    Rehab Potential Good    PT Frequency 2x / week    PT Duration 6 weeks    PT Treatment/Interventions ADLs/Self Care Home  Management;Cryotherapy;Electrical Stimulation;Traction;Moist Heat;Ultrasound;Gait training;Stair training;Functional mobility training;Therapeutic activities;Therapeutic exercise;Balance training;Neuromuscular re-education;Patient/family education;Manual techniques;Passive range of motion;Dry needling;Taping    PT Next Visit Plan review HEP, progress hip/core strength, consider modalities/DN    PT Home Exercise Plan Access Code: [email or web address]    Consulted and Agree with Plan of Care Patient             Patient will benefit from skilled therapeutic intervention in order to improve the following deficits and impairments:  Abnormal gait, Increased fascial restricitons, Pain, Increased muscle spasms, Decreased strength, Impaired flexibility  Visit Diagnosis: Pain in right hip - Plan: PT plan of care cert/re-cert  Pain in left hip - Plan: PT plan of care cert/re-cert  Muscle weakness (generalized) - Plan: PT plan of care cert/re-cert  Other abnormalities of gait and mobility - Plan: PT plan of care cert/re-cert     Problem List Patient Active Problem List   Diagnosis Date Noted   Carpal tunnel syndrome on right    Trigger finger, right middle finger    Complete uterine prolapse 03/09/2021   Non-ST elevation (NSTEMI) myocardial infarction ([place]) 02/17/2018   Type 2 diabetes mellitus with complication, with long-term current use of insulin ([place]) 02/17/2018   Chest pain 01/01/2018   Unstable angina ([place]) 01/01/2018   Arm contusion, left, initial encounter 11/25/2017   Contusion of right knee 11/25/2017   Hx of CABG 10/19/2017   Epigastric pain    Pancreatitis 10/10/2017   Hyperlipidemia LDL goal <70 09/14/2017   Essential hypertension 05/01/2017   Major depression, recurrent ([place]) 05/01/2017   Tobacco abuse 05/01/2017   History of MI (myocardial infarction) 05/01/2017   Allergic rhinitis 05/01/2017   History of arthritis 05/01/2017   Anxiety disorder, unspecified 05/01/2017    Bladder prolapse, female, acquired 11/09/2013      Laureen Abrahams, PT, DPT 09/03/21 3:12 PM    [facility] [address] [place], [place], [address] Phone: [phone]   Fax:  [phone]  Name: Shemicka Cohrs MRN: [id number] Date of Birth: 01-18-1961

## 2021-09-03 NOTE — Patient Instructions (Signed)
Access Code: [email or web address] URL: [email or web address] Date: 09/03/2021 Prepared by: Faustino Congress  Exercises Supine Bridge - 2 x daily - 7 x weekly - 1-2 sets - 10 reps - 5 sec hold Sidelying Hip Abduction - 2 x daily - 7 x weekly - 1-2 sets - 10 reps Prone Hip Extension - 2 x daily - 7 x weekly - 1-2 sets - 10 reps Standing Hip Hiking - 2 x daily - 7 x weekly - 1-2 sets - 10 reps - 5 sec hold Supine Posterior Pelvic Tilt - 2 x daily - 7 x weekly - 1 sets - 10 reps - 5 sec hold Self Release - 2 x daily - 7 x weekly - 1-2 sets - 10 reps

## 2021-09-04 NOTE — Progress Notes (Signed)
HPI: Victoria Eaton is a 60 y.o. female, who is here today for chronic disease management.  Last seen on 06/07/21.  Hypertension:  Medications:Metoprolol tartrate 50 mg bid,Lisinopril 5 mg daily,Amlodipine 5 mg daily,and Imdur 120 mg daily. BP readings at home:Not checking. Side effects:None.  Negative for unusual or severe headache, visual changes, exertional chest pain, dyspnea,  focal weakness, or edema.  Lab Results  Component Value Date   CREATININE 1.07 (H) 07/03/2021   BUN 24 (H) 07/03/2021   NA 139 07/03/2021   K 4.3 07/03/2021   CL 106 07/03/2021   CO2 25 07/03/2021   Diabetes Mellitus II:  - Checking BG at home: "Good" but for the past couple days 200's. - Medications: Toujeo 52 units daily and Trulicity 1.5 mg weekly. - Compliance: Good. - Diet: Eating better since her sister has been living with her. - Exercise: "Not much" due to cold weather.PT started yesterday. Planning on joint [employer] and sign for pool exercises. - eye exam: 03/2021. - foot exam: A year ago.  - Negative for symptoms of hypoglycemia, polyuria, polydipsia, numbness extremities, foot ulcers/trauma  Lab Results  Component Value Date   HGBA1C 7.1 (A) 06/07/2021   Lab Results  Component Value Date   MICROALBUR 49.1 (H) 03/14/2021   Anxiety and depression: States that she "fired her psychologist." She is on Sertraline 100 mg 2 tabs daily and Wellbutrin XL 300 mg daily. She is not sure if current medications are really helping.  Problem is stable. Still smoking but she decreased cig/day. Depression screen [facility] 2/9 03/08/2021 11/07/2020 04/16/2020 01/27/2020 11/18/2019  Decreased Interest 2 0 1 3 1   Down, Depressed, Hopeless 3 0 1 3 2   PHQ - 2 Score 5 0 2 6 3   Altered sleeping 0 - - 3 0  Tired, decreased energy 1 - - 3 2  Change in appetite 0 - - 2 0  Feeling bad or failure about yourself  1 - - 3 1  Trouble concentrating 1 - - 3 3  Moving slowly or fidgety/restless 0 - - 2 2  Suicidal  thoughts 0 - - 0 0  PHQ-9 Score 8 - - 22 11  Difficult doing work/chores - - - Very difficult -  Some recent data might be hidden   Chronic diarrhea. Problem has been going on for a few years. 10-11 loose stools per day. Occasionally blood on tissue. She is not sure about last colonoscopy.  She takes about 10 imodium per day. Negative for associated fever,night sweats,abdominal pain,N/V.  Review of Systems  Constitutional:  Positive for fatigue. Negative for activity change, appetite change and chills.  HENT:  Negative for sore throat and trouble swallowing.   Respiratory:  Negative for cough and wheezing.   Gastrointestinal:  Negative for abdominal distention and rectal pain.  Genitourinary:  Negative for decreased urine volume, dysuria and hematuria.  Neurological:  Negative for syncope and facial asymmetry.  Psychiatric/Behavioral:  Negative for confusion. The patient is nervous/anxious.   Rest of ROS see pertinent positives and negatives in HPI.  Current Outpatient Medications on File Prior to Visit  Medication Sig Dispense Refill   acetaminophen (TYLENOL) 500 MG tablet Take 1 tablet (500 mg total) by mouth every 6 (six) hours as needed (pain). 30 tablet 0   amLODipine (NORVASC) 5 MG tablet TAKE ONE TABLET BY MOUTH ONE TIME DAILY 90 tablet 3   aspirin 81 MG EC tablet Take 81 mg by mouth daily.     buPROPion [facility]  XL) 300 MG 24 hr tablet TAKE ONE TABLET BY MOUTH ONE TIME DAILY 90 tablet 2   clopidogrel (PLAVIX) 75 MG tablet Take 1 tablet (75 mg total) by mouth daily. 90 tablet 2   Continuous Blood Gluc Sensor (FREESTYLE LIBRE 14 DAY SENSOR) MISC APPLY ONE SENSOR TO THE BACK OF YOUR UPPER ARM. REPLACE EVERY 14 DAYS. 62 each 5   dapagliflozin propanediol (FARXIGA) 5 MG TABS tablet Take 1 tablet (5 mg total) by mouth daily before breakfast. 90 tablet 3   Dulaglutide (TRULICITY) 1.5 SE/8.3TD SOPN Inject 1.5 mg into the skin once a week. (Patient taking differently: Inject 1.5 mg  into the skin every Tuesday.) 0.5 mL 6   ibuprofen (ADVIL) 600 MG tablet Take 1 tablet (600 mg total) by mouth every 6 (six) hours as needed. 30 tablet 0   insulin aspart (NOVOLOG FLEXPEN) 100 UNIT/ML FlexPen Inject 5 Units into the skin 3 (three) times daily with meals as needed for high blood sugar.     insulin glargine, 2 Unit Dial, (TOUJEO MAX SOLOSTAR) 300 UNIT/ML Solostar Pen Inject 48 Units into the skin daily. (Patient taking differently: Inject 52 Units into the skin every morning.) 9 mL 2   Insulin Pen Needle (ULTICARE MICRO PEN NEEDLES) 32G X 4 MM MISC USE WITH INJECTIONS ONE TIME DAILY 400 each 0   Iron, Ferrous Sulfate, 325 (65 Fe) MG TABS Take 325 mg by mouth daily. 90 tablet 2   isosorbide mononitrate (IMDUR) 120 MG 24 hr tablet Take 1 tablet (120 mg total) by mouth daily. 90 tablet 3   lisinopril (ZESTRIL) 5 MG tablet TAKE ONE TABLET BY MOUTH ONE TIME DAILY 90 tablet 1   loratadine (CLARITIN) 10 MG tablet Take 10 mg by mouth daily.     methocarbamol (ROBAXIN) 500 MG tablet Take 1 tablet (500 mg total) by mouth 2 (two) times daily as needed. 20 tablet 0   metoprolol tartrate (LOPRESSOR) 50 MG tablet TAKE ONE TABLET BY MOUTH TWICE A DAY 180 tablet 2   Multiple Vitamin (MULTIVITAMIN WITH MINERALS) TABS tablet Take 1 tablet by mouth daily.     nitroGLYCERIN (NITROSTAT) 0.4 MG SL tablet Place 0.4 mg under the tongue every 5 (five) minutes as needed for chest pain.     Omega-3 Fatty Acids (FISH OIL) 1200 MG CAPS Take 1,200 mg by mouth daily.     oxyCODONE (OXY IR/ROXICODONE) 5 MG immediate release tablet Take 1 tablet (5 mg total) by mouth every 4 (four) hours as needed for severe pain. 15 tablet 0   polyethylene glycol powder (GLYCOLAX/MIRALAX) 17 GM/SCOOP powder Take 17 g by mouth daily. Drink 17g (1 scoop) dissolved in water per day. 255 g 0   rosuvastatin (CRESTOR) 40 MG tablet Take 1 tablet (40 mg total) by mouth daily. 90 tablet 3   sertraline (ZOLOFT) 100 MG tablet TAKE TWO TABLETS  BY MOUTH ONE TIME DAILY 180 tablet 2   Turmeric 500 MG CAPS Take 1,000 mg by mouth daily.     No current facility-administered medications on file prior to visit.    Past Medical History:  Diagnosis Date   Anxiety    Arthritis    "maybe in my left foot" (10/14/2017)   Bladder prolapse, female, acquired 09/14/2017   Coronary artery disease    Depression    High cholesterol    History of stomach ulcers    Hypertension    Myocardial infarction ([place]) 12/30/2004   Tobacco abuse    Type II  diabetes mellitus (HCC)    Allergies  Allergen Reactions   Januvia [Sitagliptin] Nausea And Vomiting and Other (See Comments)    Pancreatitis (this was in Epic, but patient was unaware of this)    Metformin And Related Diarrhea   Penicillins Other (See Comments)    Unknown childhood reaction Has patient had a PCN reaction causing immediate rash, facial/tongue/throat swelling, SOB or lightheadedness with hypotension: Unknown Has patient had a PCN reaction causing severe rash involving mucus membranes or skin necrosis: Unknown Has patient had a PCN reaction that required hospitalization: Unknown Has patient had a PCN reaction occurring within the last 10 years: No If all of the above answers are "NO", then may proceed with Cephalosporin use.     Social History   Socioeconomic History   Marital status: Widowed    Spouse name: Glendell Docker   Number of children: 1   Years of education: Not on file   Highest education level: Not on file  Occupational History   Not on file  Tobacco Use   Smoking status: Some Days    Packs/day: 0.20    Years: 45.00    Pack years: 9.00    Types: Cigarettes    Start date: 05/01/1972    Last attempt to quit: 09/30/2017    Years since quitting: 3.9   Smokeless tobacco: Never  Vaping Use   Vaping Use: Never used  Substance and Sexual Activity   Alcohol use: Yes    Alcohol/week: 7.0 standard drinks    Types: 7 Standard drinks or equivalent per week    Comment: daily:  2-3 wine   Drug use: No   Sexual activity: Not Currently  Other Topics Concern   Not on file  Social History Narrative   Not on file   Social Determinants of Health   Financial Resource Strain: Not on file  Food Insecurity: Not on file  Transportation Needs: Not on file  Physical Activity: Not on file  Stress: Not on file  Social Connections: Not on file    Vitals:   09/06/21 1052  BP: 124/70  Pulse: 75  Resp: 16  SpO2: 97%   Body mass index is 31.53 kg/m.  Physical Exam Vitals and nursing note reviewed.  Constitutional:      General: She is not in acute distress.    Appearance: She is well-developed.  HENT:     Head: Normocephalic and atraumatic.     Mouth/Throat:     Mouth: Mucous membranes are moist.     Pharynx: Oropharynx is clear.  Eyes:     Conjunctiva/sclera: Conjunctivae normal.  Cardiovascular:     Rate and Rhythm: Normal rate and regular rhythm.     Pulses:          Dorsalis pedis pulses are 2+ on the right side and 2+ on the left side.     Heart sounds: No murmur heard. Pulmonary:     Effort: Pulmonary effort is normal. No respiratory distress.     Breath sounds: Normal breath sounds.  Abdominal:     Palpations: Abdomen is soft. There is no hepatomegaly or mass.     Tenderness: There is no abdominal tenderness.  Lymphadenopathy:     Cervical: No cervical adenopathy.  Skin:    General: Skin is warm.     Findings: No erythema or rash.  Neurological:     General: No focal deficit present.     Mental Status: She is alert and oriented to person, place, and  time.     Cranial Nerves: No cranial nerve deficit.     Gait: Gait normal.  Psychiatric:        Mood and Affect: Mood is not anxious or depressed. Affect is flat.     Comments: Well groomed, good eye contact.   Diabetic Foot Exam - Simple   Simple Foot Form Diabetic Foot exam was performed with the following findings: Yes 09/06/2021 11:40 AM  Visual Inspection See comments: Yes Sensation  Testing Intact to touch and monofilament testing bilaterally: Yes Pulse Check Posterior Tibialis and Dorsalis pulse intact bilaterally: Yes Comments Small bunions.    ASSESSMENT AND PLAN:  [name] was seen today for follow-up.  Diagnoses and all orders for this visit: Orders Placed This Encounter  Procedures   Flu Vaccine QUAD 9moIM (Fluarix, Fluzone & Alfiuria Quad PF)   Microalbumin / creatinine urine ratio   Ambulatory Referral for Lung Cancer Scre   Ambulatory referral to Gastroenterology   POC HgB A1c   Lab Results  Component Value Date   HGBA1C 6.8 09/06/2021   Lab Results  Component Value Date   LABMICR 18.3 05/23/2019   LABMICR 9.9 05/01/2017   MICROALBUR 3.7 (H) 09/06/2021   MICROALBUR 49.1 (H) 03/14/2021     Type 2 diabetes mellitus with complication, with long-term current use of insulin (HCC) HgA1C at goal. Continue Toujeo 52 units daily and Trulicity 1.5 mg weekly. Regular exercise and healthy diet with avoidance of added sugar food intake encouraged. Annual eye exam, periodic dental and foot care recommended. F/U in 5-6 months   Essential hypertension BP adequately controlled. Continue current management: Metoprolol tartrate 50 mg bid,Lisinopril 5 mg daily,Amlodipine 5 mg daily,and Imdur 120 mg dailyDASH/low salt diet to continue. Monitor BP at home. Eye exam recommended annually.  Chronic diarrhea According to records she was told to follow a year after last colonoscopy but she moved out of town. Last colonoscopy 04/2015. Caution with Imodium, recommend decreasing dose to max 3 tabs per day. Adequate hydration. GI referral placed.  Anxiety and depression Stable. Continue same dose of Sertraline and Wellbutrin. List of providers in area given, recommend establishing with psychiatrist.  Screening for lung cancer -     Ambulatory Referral for Lung Cancer Scre  Chronic diarrhea -     Ambulatory referral to Gastroenterology  Need for  influenza vaccination -     Flu Vaccine QUAD 632moM (Fluarix, Fluzone & Alfiuria Quad PF)  Return in about 5 months (around 02/04/2022).  Juley Giovanetti G. JoMartiniqueMD  [facility]eville[facility].

## 2021-09-05 ENCOUNTER — Encounter: Payer: Self-pay | Admitting: Physical Therapy

## 2021-09-05 ENCOUNTER — Ambulatory Visit: Payer: 59 | Attending: Orthopaedic Surgery | Admitting: Physical Therapy

## 2021-09-05 ENCOUNTER — Other Ambulatory Visit: Payer: Self-pay

## 2021-09-05 DIAGNOSIS — M6281 Muscle weakness (generalized): Secondary | ICD-10-CM | POA: Insufficient documentation

## 2021-09-05 DIAGNOSIS — M25551 Pain in right hip: Secondary | ICD-10-CM | POA: Diagnosis not present

## 2021-09-05 DIAGNOSIS — M25552 Pain in left hip: Secondary | ICD-10-CM | POA: Insufficient documentation

## 2021-09-05 NOTE — Therapy (Signed)
[place]. [place], [place], [address] Phone: [phone]   Fax:  [phone]  Physical Therapy Treatment  Patient Details  Name: Victoria Eaton MRN: [id number] Date of Birth: 30-Jul-1961 Referring Provider (PT): Leandrew Koyanagi, MD   Encounter Date: 09/05/2021   PT End of Session - 09/05/21 1230     Visit Number 2    Date for PT Re-Evaluation 10/15/21    PT Start Time 1607    PT Stop Time 1226    PT Time Calculation (min) 41 min    Activity Tolerance Patient tolerated treatment well    Behavior During Therapy [facility] for tasks assessed/performed             Past Medical History:  Diagnosis Date   Anxiety    Arthritis    "maybe in my left foot" (10/14/2017)   Bladder prolapse, female, acquired 09/14/2017   Coronary artery disease    Depression    High cholesterol    History of stomach ulcers    Hypertension    Myocardial infarction ([place]) 12/30/2004   Tobacco abuse    Type II diabetes mellitus ([place])     Past Surgical History:  Procedure Laterality Date   ANTERIOR AND POSTERIOR REPAIR N/A 07/09/2021   Procedure: ANTERIOR (CYSTOCELE) AND POSTERIOR REPAIR (RECTOCELE) with perineorrhaphy;  Surgeon: [name], MD;  Location: [facility];  Service: Gynecology;  Laterality: N/A;   CARDIAC CATHETERIZATION  10/14/2017   CARPAL TUNNEL RELEASE Right 04/10/2021   Procedure: RIGHT CARPAL TUNNEL RELEASE;  Surgeon: Leandrew Koyanagi, MD;  Location: [place];  Service: Orthopedics;  Laterality: Right;   CORONARY ANGIOPLASTY WITH STENT PLACEMENT  12/30/2004   Patient reported   CORONARY ARTERY BYPASS GRAFT N/A 10/19/2017   Procedure: CORONARY ARTERY BYPASS GRAFTING (CABG) times four using the right saphaneous vein. Harvested endoscopicly and left internal mammary artery.;  Surgeon: Grace Isaac, MD;  Location: [place];  Service: Open Heart Surgery;  Laterality: N/A;   CYSTOSCOPY N/A 07/09/2021   Procedure:  CYSTOSCOPY;  Surgeon: [name], MD;  Location: [facility];  Service: Gynecology;  Laterality: N/A;   DILATION AND CURETTAGE OF UTERUS  1980s   LEFT HEART CATH AND CORONARY ANGIOGRAPHY N/A 10/14/2017   Procedure: LEFT HEART CATH AND CORONARY ANGIOGRAPHY;  Surgeon: Martinique, Peter M, MD;  Location: [place] [facility];  Service: Cardiovascular;  Laterality: N/A;   LEFT HEART CATH AND CORS/GRAFTS ANGIOGRAPHY N/A 01/01/2018   Procedure: LEFT HEART CATH AND CORS/GRAFTS ANGIOGRAPHY;  Surgeon: Jettie Booze, MD;  Location: [place] [facility];  Service: Cardiovascular;  Laterality: N/A;   LEFT HEART CATH AND CORS/GRAFTS ANGIOGRAPHY N/A 08/16/2018   Procedure: LEFT HEART CATH AND CORS/GRAFTS ANGIOGRAPHY;  Surgeon: Nelva Bush, MD;  Location: [place] [facility];  Service: Cardiovascular;  Laterality: N/A;   PILONIDAL CYST EXCISION  1980s   TEE WITHOUT CARDIOVERSION N/A 10/19/2017   Procedure: TRANSESOPHAGEAL ECHOCARDIOGRAM (TEE);  Surgeon: Grace Isaac, MD;  Location: [place];  Service: Open Heart Surgery;  Laterality: N/A;   TRIGGER FINGER RELEASE Right 04/10/2021   Procedure: RIGHT LONG FINGER TRIGGER RELEASE;  Surgeon: Leandrew Koyanagi, MD;  Location: [place];  Service: Orthopedics;  Laterality: Right;   VAGINAL HYSTERECTOMY N/A 07/09/2021   Procedure: HYSTERECTOMY VAGINAL;  Surgeon: [name], MD;  Location[facility];  Service: Gynecology;  Laterality: N/A;  total time requested for all procedures is 3 hours   VAGINAL PROLAPSE  REPAIR N/A 07/09/2021   Procedure: VAGINAL VAULT SUSPENSION;  Surgeon: [name], MD;  Location: [facility];  Service: Gynecology;  Laterality: N/A;    There were no vitals filed for this visit.   Subjective Assessment - 09/05/21 1149     Subjective Bilateral hip pain with decrease walking tolerance    Currently in Pain? No/denies                               [facility] Adult PT Treatment/Exercise - 09/05/21 0001        Exercises   Exercises Knee/Hip      Knee/Hip Exercises: Aerobic   Nustep L3 x 28mn      Knee/Hip Exercises: Machines for Strengthening   Cybex Knee Extension 5lb 2x10    Cybex Knee Flexion 20lb 2x10      Knee/Hip Exercises: Standing   Walking with Sports Cord 30lb 4 way x3 each      Knee/Hip Exercises: Seated   Ball Squeeze 2x10    Sit to Sand 2 sets;10 reps;without UE support   holding yellow ball     Knee/Hip Exercises: Supine   Bridges Strengthening;Both;1 set;10 reps    Straight Leg Raises Strengthening;Both;1 set;10 reps                       PT Short Term Goals - 09/05/21 1233       PT SHORT TERM GOAL #1   Title independent with initial HEP    Status On-going               PT Long Term Goals - 09/03/21 1428       PT LONG TERM GOAL #1   Title independent with final HEP    Time 6    Period Weeks    Status New    Target Date 10/15/21      PT LONG TERM GOAL #2   Title report pain < 3/10 with activity for improved function    Time 6    Period Weeks    Status New    Target Date 10/15/21      PT LONG TERM GOAL #3   Title FOTO score improved to 49 for improved function    Time 6    Period Weeks    Status New    Target Date 10/15/21      PT LONG TERM GOAL #4   Title demonstrate 4/5 bil hip strength for improved function    Time 6    Period Weeks    Status New    Target Date 10/15/21                   Plan - 09/05/21 1230     Clinical Impression Statement Pt tolerated an initial progression to TE well. Some instability and hip weakness with resisted gait more so with side steps. Some pin point pain over the R GT with palpation. Cue needed to bring hip forward with sit to stands. Some difficulty wit supine hip abduction.    Comorbidities chronic LBP, CABG,MI,DM, hysterectomy Oct 2022    Examination-Activity Limitations Locomotion Level;Transfers;Sit;Squat;Stairs;Stand;Lift    Examination-Participation Restrictions  Cleaning;Meal Prep;Community Activity;Occupation;Driving;Shop;Yard Work    Stability/Clinical Decision Making Evolving/Moderate complexity    Rehab Potential Good    PT Frequency 2x / week    PT Treatment/Interventions ADLs/Self Care Home Management;Cryotherapy;Electrical Stimulation;Traction;Moist Heat;Ultrasound;Gait training;Stair training;Functional mobility training;Therapeutic activities;Therapeutic  exercise;Balance training;Neuromuscular re-education;Patient/family education;Manual techniques;Passive range of motion;Dry needling;Taping    PT Next Visit Plan review HEP, progress hip/core strength, consider modalities/DN             Patient will benefit from skilled therapeutic intervention in order to improve the following deficits and impairments:  Abnormal gait, Increased fascial restricitons, Pain, Increased muscle spasms, Decreased strength, Impaired flexibility  Visit Diagnosis: Pain in right hip  Muscle weakness (generalized)  Pain in left hip     Problem List Patient Active Problem List   Diagnosis Date Noted   Carpal tunnel syndrome on right    Trigger finger, right middle finger    Complete uterine prolapse 03/09/2021   Non-ST elevation (NSTEMI) myocardial infarction ([place]) 02/17/2018   Type 2 diabetes mellitus with complication, with long-term current use of insulin ([place]) 02/17/2018   Chest pain 01/01/2018   Unstable angina (HCC) 01/01/2018   Arm contusion, left, initial encounter 11/25/2017   Contusion of right knee 11/25/2017   Hx of CABG 10/19/2017   Epigastric pain    Pancreatitis 10/10/2017   Hyperlipidemia LDL goal <70 09/14/2017   Essential hypertension 05/01/2017   Major depression, recurrent ([place]) 05/01/2017   Tobacco abuse 05/01/2017   History of MI (myocardial infarction) 05/01/2017   Allergic rhinitis 05/01/2017   History of arthritis 05/01/2017   Anxiety disorder, unspecified 05/01/2017   Bladder prolapse, female, acquired 11/09/2013     Scot Jun, PTA 09/05/2021, 12:34 PM  [address] [place], [place], [address] Phone: [phone]   Fax:  [phone]  Name: Oveda Dadamo MRN: [id number] Date of Birth: 1961/08/28

## 2021-09-06 ENCOUNTER — Encounter: Payer: Self-pay | Admitting: Family Medicine

## 2021-09-06 ENCOUNTER — Ambulatory Visit (INDEPENDENT_AMBULATORY_CARE_PROVIDER_SITE_OTHER): Payer: 59 | Admitting: Family Medicine

## 2021-09-06 VITALS — BP 124/70 | HR 75 | Resp 16 | Ht 66.0 in | Wt 195.4 lb

## 2021-09-06 DIAGNOSIS — I10 Essential (primary) hypertension: Secondary | ICD-10-CM | POA: Diagnosis not present

## 2021-09-06 DIAGNOSIS — Z23 Encounter for immunization: Secondary | ICD-10-CM | POA: Diagnosis not present

## 2021-09-06 DIAGNOSIS — Z122 Encounter for screening for malignant neoplasm of respiratory organs: Secondary | ICD-10-CM | POA: Diagnosis not present

## 2021-09-06 DIAGNOSIS — E785 Hyperlipidemia, unspecified: Secondary | ICD-10-CM

## 2021-09-06 DIAGNOSIS — Z794 Long term (current) use of insulin: Secondary | ICD-10-CM | POA: Diagnosis not present

## 2021-09-06 DIAGNOSIS — E118 Type 2 diabetes mellitus with unspecified complications: Secondary | ICD-10-CM

## 2021-09-06 DIAGNOSIS — F32A Depression, unspecified: Secondary | ICD-10-CM

## 2021-09-06 DIAGNOSIS — K529 Noninfective gastroenteritis and colitis, unspecified: Secondary | ICD-10-CM

## 2021-09-06 DIAGNOSIS — F419 Anxiety disorder, unspecified: Secondary | ICD-10-CM | POA: Diagnosis not present

## 2021-09-06 LAB — POCT GLYCOSYLATED HEMOGLOBIN (HGB A1C): HbA1c, POC (controlled diabetic range): 6.8 % (ref 0.0–7.0)

## 2021-09-06 LAB — MICROALBUMIN / CREATININE URINE RATIO
Creatinine,U: 96.3 mg/dL
Microalb Creat Ratio: 3.8 mg/g (ref 0.0–30.0)
Microalb, Ur: 3.7 mg/dL — ABNORMAL HIGH (ref 0.0–1.9)

## 2021-09-06 NOTE — Assessment & Plan Note (Signed)
BP adequately controlled. Continue current management: Metoprolol tartrate 50 mg bid,Lisinopril 5 mg daily,Amlodipine 5 mg daily,and Imdur 120 mg dailyDASH/low salt diet to continue. Monitor BP at home. Eye exam recommended annually.

## 2021-09-06 NOTE — Assessment & Plan Note (Addendum)
According to records she was told to follow a year after last colonoscopy but she moved out of town. Last colonoscopy 04/2015. Caution with Imodium, recommend decreasing dose to max 3 tabs per day. Adequate hydration. GI referral placed.

## 2021-09-06 NOTE — Patient Instructions (Addendum)
A few things to remember from today's visit:  Type 2 diabetes mellitus with complication, with long-term current use of insulin ([place]) - Plan: POC HgB A1c, Microalbumin / creatinine urine ratio  Essential hypertension  Anxiety and depression  Screening for lung cancer - Plan: Ambulatory Referral for Lung Cancer Scre  Chronic diarrhea - Plan: Ambulatory referral to Gastroenterology  If you need refills please call your pharmacy. Do not use My Chart to request refills or for acute issues that need immediate attention.   No changes today. Try to establish with psychiatrist, some are on list given today. Adequate hydration. Gastro appt will be arranged.  Please be sure medication list is accurate. If a new problem present, please set up appointment sooner than planned today.

## 2021-09-06 NOTE — Assessment & Plan Note (Addendum)
HgA1C at goal. Continue Toujeo 52 units daily and Trulicity 1.5 mg weekly. Regular exercise and healthy diet with avoidance of added sugar food intake encouraged. Annual eye exam, periodic dental and foot care recommended. F/U in 5-6 months

## 2021-09-07 ENCOUNTER — Other Ambulatory Visit: Payer: Self-pay | Admitting: Emergency Medicine

## 2021-09-07 DIAGNOSIS — E118 Type 2 diabetes mellitus with unspecified complications: Secondary | ICD-10-CM

## 2021-09-09 ENCOUNTER — Encounter: Payer: 59 | Attending: Family Medicine | Admitting: Dietician

## 2021-09-10 ENCOUNTER — Ambulatory Visit: Payer: 59 | Admitting: Physical Therapy

## 2021-09-10 ENCOUNTER — Encounter: Payer: Self-pay | Admitting: Physical Therapy

## 2021-09-10 ENCOUNTER — Other Ambulatory Visit: Payer: Self-pay

## 2021-09-10 DIAGNOSIS — M6281 Muscle weakness (generalized): Secondary | ICD-10-CM

## 2021-09-10 DIAGNOSIS — M25551 Pain in right hip: Secondary | ICD-10-CM

## 2021-09-10 DIAGNOSIS — M25552 Pain in left hip: Secondary | ICD-10-CM

## 2021-09-10 NOTE — Therapy (Signed)
[place] [place]. [place], [place], [address] Phone: [phone]   Fax:  [phone]  Physical Therapy Treatment  Patient Details  Name: Victoria Eaton MRN: [id number] Date of Birth: 28-Dec-1960 Referring Provider (PT): Leandrew Koyanagi, MD   Encounter Date: 09/10/2021   PT End of Session - 09/10/21 1502     Visit Number 3    Date for PT Re-Evaluation 10/15/21    PT Start Time 1430    PT Stop Time 1500    PT Time Calculation (min) 30 min    Activity Tolerance Patient tolerated treatment well    Behavior During Therapy [facility] for tasks assessed/performed             Past Medical History:  Diagnosis Date   Anxiety    Arthritis    "maybe in my left foot" (10/14/2017)   Bladder prolapse, female, acquired 09/14/2017   Coronary artery disease    Depression    High cholesterol    History of stomach ulcers    Hypertension    Myocardial infarction ([place]) 12/30/2004   Tobacco abuse    Type II diabetes mellitus ([place])     Past Surgical History:  Procedure Laterality Date   ANTERIOR AND POSTERIOR REPAIR N/A 07/09/2021   Procedure: ANTERIOR (CYSTOCELE) AND POSTERIOR REPAIR (RECTOCELE) with perineorrhaphy;  Surgeon: [name], MD;  [facility];  Service: Gynecology;  Laterality: N/A;   CARDIAC CATHETERIZATION  10/14/2017   CARPAL TUNNEL RELEASE Right 04/10/2021   Procedure: RIGHT CARPAL TUNNEL RELEASE;  Surgeon: Leandrew Koyanagi, MD;  Location: [place];  Service: Orthopedics;  Laterality: Right;   CORONARY ANGIOPLASTY WITH STENT PLACEMENT  12/30/2004   Patient reported   CORONARY ARTERY BYPASS GRAFT N/A 10/19/2017   Procedure: CORONARY ARTERY BYPASS GRAFTING (CABG) times four using the right saphaneous vein. Harvested endoscopicly and left internal mammary artery.;  Surgeon: Grace Isaac, MD;  Location: [place];  Service: Open Heart Surgery;  Laterality: N/A;   CYSTOSCOPY N/A 07/09/2021   Procedure:  CYSTOSCOPY;  Surgeon: [name], MD;  [facility];  Service: Gynecology;  Laterality: N/A;   DILATION AND CURETTAGE OF UTERUS  1980s   LEFT HEART CATH AND CORONARY ANGIOGRAPHY N/A 10/14/2017   Procedure: LEFT HEART CATH AND CORONARY ANGIOGRAPHY;  Surgeon: Martinique, Peter M, MD;  Location: [facility];  Service: Cardiovascular;  Laterality: N/A;   LEFT HEART CATH AND CORS/GRAFTS ANGIOGRAPHY N/A 01/01/2018   Procedure: LEFT HEART CATH AND CORS/GRAFTS ANGIOGRAPHY;  Surgeon: Jettie Booze, MD;  Location: [place] [facility];  Service: Cardiovascular;  Laterality: N/A;   LEFT HEART CATH AND CORS/GRAFTS ANGIOGRAPHY N/A 08/16/2018   Procedure: LEFT HEART CATH AND CORS/GRAFTS ANGIOGRAPHY;  Surgeon: Nelva Bush, MD;  Location: [place] [facility];  Service: Cardiovascular;  Laterality: N/A;   PILONIDAL CYST EXCISION  1980s   TEE WITHOUT CARDIOVERSION N/A 10/19/2017   Procedure: TRANSESOPHAGEAL ECHOCARDIOGRAM (TEE);  Surgeon: Grace Isaac, MD;  Location: [place];  Service: Open Heart Surgery;  Laterality: N/A;   TRIGGER FINGER RELEASE Right 04/10/2021   Procedure: RIGHT LONG FINGER TRIGGER RELEASE;  Surgeon: Leandrew Koyanagi, MD;  Location: [place];  Service: Orthopedics;  Laterality: Right;   VAGINAL HYSTERECTOMY N/A 07/09/2021   Procedure: HYSTERECTOMY VAGINAL;  Surgeon: [name], MD;  [facility];  Service: Gynecology;  Laterality: N/A;  total time requested for all procedures is 3 hours   VAGINAL PROLAPSE  REPAIR N/A 07/09/2021   Procedure: VAGINAL VAULT SUSPENSION;  Surgeon: [name], MD;  [facility];  Service: Gynecology;  Laterality: N/A;    There were no vitals filed for this visit.   Subjective Assessment - 09/10/21 1428     Subjective "I hurt, from the whole L side of my body, neck down"    Currently in Pain? Yes    Pain Score 2     Pain Location --   neck, shoulder, small of her back   Pain Orientation Left                                [facility] Adult PT Treatment/Exercise - 09/10/21 0001       Knee/Hip Exercises: Machines for Strengthening   Cybex Knee Extension 5lb 2x10    Cybex Knee Flexion 20lb 2x10    Cybex Leg Press 30lb 2x10      Knee/Hip Exercises: Standing   Lateral Step Up Both;1 set;10 reps;Hand Hold: 0;Step Height: 6"                       PT Short Term Goals - 09/05/21 1233       PT SHORT TERM GOAL #1   Title independent with initial HEP    Status On-going               PT Long Term Goals - 09/03/21 1428       PT LONG TERM GOAL #1   Title independent with final HEP    Time 6    Period Weeks    Status New    Target Date 10/15/21      PT LONG TERM GOAL #2   Title report pain < 3/10 with activity for improved function    Time 6    Period Weeks    Status New    Target Date 10/15/21      PT LONG TERM GOAL #3   Title FOTO score improved to 49 for improved function    Time 6    Period Weeks    Status New    Target Date 10/15/21      PT LONG TERM GOAL #4   Title demonstrate 4/5 bil hip strength for improved function    Time 6    Period Weeks    Status New    Target Date 10/15/21                   Plan - 09/10/21 1503     Clinical Impression Statement Shorten treatment session due to pt's request. Cues for eccentric control needed on leg press. Some bilateral hip fatigue present with resisted gait. No issues with seated leg curls and extensions.    Personal Factors and Comorbidities Comorbidity 3+    Comorbidities chronic LBP, CABG,MI,DM, hysterectomy Oct 2022    Examination-Activity Limitations Locomotion Level;Transfers;Sit;Squat;Stairs;Stand;Lift    Examination-Participation Restrictions Cleaning;Meal Prep;Community Activity;Occupation;Driving;Shop;Yard Work    Stability/Clinical Decision Making Evolving/Moderate complexity    Rehab Potential Good    PT Duration 6 weeks    PT Treatment/Interventions ADLs/Self Care  Home Management;Cryotherapy;Electrical Stimulation;Traction;Moist Heat;Ultrasound;Gait training;Stair training;Functional mobility training;Therapeutic activities;Therapeutic exercise;Balance training;Neuromuscular re-education;Patient/family education;Manual techniques;Passive range of motion;Dry needling;Taping    PT Next Visit Plan progress hip/core strength, consider modalities/DN             Patient will benefit from skilled therapeutic intervention in order to  improve the following deficits and impairments:  Abnormal gait, Increased fascial restricitons, Pain, Increased muscle spasms, Decreased strength, Impaired flexibility  Visit Diagnosis: Muscle weakness (generalized)  Pain in left hip  Pain in right hip     Problem List Patient Active Problem List   Diagnosis Date Noted   Chronic diarrhea 09/06/2021   Carpal tunnel syndrome on right    Trigger finger, right middle finger    Complete uterine prolapse 03/09/2021   Non-ST elevation (NSTEMI) myocardial infarction ([place]) 02/17/2018   Type 2 diabetes mellitus with complication, with long-term current use of insulin ([place]) 02/17/2018   Chest pain 01/01/2018   Unstable angina ([place]) 01/01/2018   Arm contusion, left, initial encounter 11/25/2017   Contusion of right knee 11/25/2017   Hx of CABG 10/19/2017   Epigastric pain    Pancreatitis 10/10/2017   Hyperlipidemia LDL goal <70 09/14/2017   Essential hypertension 05/01/2017   Major depression, recurrent ([place]) 05/01/2017   Tobacco abuse 05/01/2017   History of MI (myocardial infarction) 05/01/2017   Allergic rhinitis 05/01/2017   History of arthritis 05/01/2017   Anxiety disorder, unspecified 05/01/2017   Bladder prolapse, female, acquired 11/09/2013    Scot Jun, PTA 09/10/2021, 3:06 PM  [place]. [place], [place], [address] Phone: [phone]   Fax:  [phone]  Name: Sharonann Malbrough MRN: [id number] Date of Birth: 05-30-1961

## 2021-09-11 ENCOUNTER — Other Ambulatory Visit: Payer: Self-pay | Admitting: Family Medicine

## 2021-09-11 DIAGNOSIS — Z794 Long term (current) use of insulin: Secondary | ICD-10-CM

## 2021-09-12 ENCOUNTER — Ambulatory Visit: Payer: 59 | Admitting: Physical Therapy

## 2021-09-12 NOTE — Progress Notes (Signed)
[facility]  Date of Visit: 09/13/2021  History of Present Illness: [name] is a 60 y.o. female scheduled today for a post-operative visit.   Surgery: s/p TVH, USLS, A/P repair with perineorrhaphy, urethral bulking and cystoscopy on 07/09/21  She passed her postoperative void trial.   Postoperative course complicated by a urinary tract infection, treated with bactrim (culture not obtained).   Today she reports she feels some bulge still. Sill having occasional leakage. But overall has seen improvement.   UTI in the last 6 weeks? Yes, no current symptoms since taking bactrim.  Pain? No  She has not returned to her normal activity (except for postop restrictions) Vaginal bulge? Yes  Stress incontinence: Yes - occasional, happens out of nowhere. Not occurring every day.  Urgency/frequency: No  Urge incontinence: No  Voiding dysfunction: Yes - sometimes has some hesitancy Bowel issues: No   Subjective Success: Do you usually have a bulge or something falling out that you can see or feel in the vaginal area? Yes  Retreatment Success: Any retreatment with surgery or pessary for any compartment? No   Pathology results: UTERUS AND CERVIX, HYSTERECTOMY:  - Uterus with benign inactive endometrium  - Benign endometrial polyp, 1.1 cm  - Benign unremarkable cervix  - No evidence of malignancy  Medications: She has a current medication list which includes the following prescription(s): acetaminophen, amlodipine, aspirin, bupropion, clopidogrel, freestyle libre 14 day sensor, dapagliflozin propanediol, trulicity, ibuprofen, novolog flexpen, toujeo max solostar, ulticare micro pen needles, iron (ferrous sulfate), isosorbide mononitrate, lisinopril, loratadine, methocarbamol, metoprolol tartrate, multivitamin with minerals, nitroglycerin, fish oil, oxycodone, polyethylene glycol powder, rosuvastatin, sertraline, and turmeric.   Allergies: Patient is allergic to [place]  [sitagliptin], metformin and related, and penicillins.   Physical Exam: BP 127/71    Pulse 85    Pelvic Examination: Vagina: Incisions well healed. No sutures noted.  No tenderness along the anterior or posterior vagina. No apical tenderness. No pelvic masses.   POP-Q: POP-Q  0                                            Aa   0                                           Ba  -8                                              C   3                                            Gh  4                                            Pb  9  tvl   -3                                            Ap  -3                                            Bp                                                 D   Previous POP-Q POP-Q (12/31/20):    POP-Q   3                                            Aa   7                                           Ba   8                                              C    6                                            Gh   2.5                                            Pb   8                                            tvl    3                                            Ap   5                                            Bp   -4                                              D     ---------------------------------------------------------  Assessment and Plan:  1. Prolapse of anterior vaginal wall   2.  Post-operative state     - Pathology results were reviewed with the patient today and she verbalized understanding that the results were benign.  - She has access to her operative report through [facility]. - Can resume regular activity including exercise and intercourse,  if desired. Can also resume tub baths.  - She has some recurrence of the anterior vaginal wall prolapse, but good support of the apex and posterior vagina. Will have her return in a few months for repeat exam. If bothersome, can review treatment options.  If  needed, can also repeat urethral bulking injections.   All questions answered.   [name], MD

## 2021-09-13 ENCOUNTER — Ambulatory Visit (INDEPENDENT_AMBULATORY_CARE_PROVIDER_SITE_OTHER): Payer: 59 | Admitting: Obstetrics and Gynecology

## 2021-09-13 ENCOUNTER — Other Ambulatory Visit: Payer: Self-pay

## 2021-09-13 ENCOUNTER — Encounter: Payer: Self-pay | Admitting: Obstetrics and Gynecology

## 2021-09-13 VITALS — BP 127/71 | HR 85

## 2021-09-13 DIAGNOSIS — Z9889 Other specified postprocedural states: Secondary | ICD-10-CM

## 2021-09-13 DIAGNOSIS — N8110 Cystocele, unspecified: Secondary | ICD-10-CM

## 2021-09-17 ENCOUNTER — Ambulatory Visit: Payer: 59 | Admitting: Physical Therapy

## 2021-09-18 ENCOUNTER — Encounter: Payer: Self-pay | Admitting: Physician Assistant

## 2021-09-19 ENCOUNTER — Encounter: Payer: Self-pay | Admitting: Physical Therapy

## 2021-09-19 ENCOUNTER — Other Ambulatory Visit: Payer: Self-pay

## 2021-09-19 ENCOUNTER — Ambulatory Visit: Payer: 59 | Admitting: Physical Therapy

## 2021-09-19 DIAGNOSIS — M25551 Pain in right hip: Secondary | ICD-10-CM | POA: Diagnosis not present

## 2021-09-19 DIAGNOSIS — M25552 Pain in left hip: Secondary | ICD-10-CM

## 2021-09-19 DIAGNOSIS — M6281 Muscle weakness (generalized): Secondary | ICD-10-CM

## 2021-09-19 NOTE — Therapy (Signed)
[facility]. [place], [place], [address] Phone: [phone]   Fax:  [phone]  Physical Therapy Treatment  Patient Details  Name: Victoria Eaton MRN: [id number] Date of Birth: 01/22/1961 Referring Provider (PT): Leandrew Koyanagi, MD   Encounter Date: 09/19/2021   PT End of Session - 09/19/21 1008     Visit Number 4    Date for PT Re-Evaluation 10/15/21    Authorization Type [employer] 20% coinsurance    PT Start Time 0928    PT Stop Time 1010    PT Time Calculation (min) 42 min    Activity Tolerance Patient tolerated treatment well    Behavior During Therapy [facility] for tasks assessed/performed             Past Medical History:  Diagnosis Date   Anxiety    Arthritis    "maybe in my left foot" (10/14/2017)   Bladder prolapse, female, acquired 09/14/2017   Coronary artery disease    Depression    High cholesterol    History of stomach ulcers    Hypertension    Myocardial infarction ([place]) 12/30/2004   Tobacco abuse    Type II diabetes mellitus ([place])     Past Surgical History:  Procedure Laterality Date   ANTERIOR AND POSTERIOR REPAIR N/A 07/09/2021   Procedure: ANTERIOR (CYSTOCELE) AND POSTERIOR REPAIR (RECTOCELE) with perineorrhaphy;  Surgeon: [name], MD;  [facility];  Service: Gynecology;  Laterality: N/A;   CARDIAC CATHETERIZATION  10/14/2017   CARPAL TUNNEL RELEASE Right 04/10/2021   Procedure: RIGHT CARPAL TUNNEL RELEASE;  Surgeon: Leandrew Koyanagi, MD;  Location: [place] [facility];  Service: Orthopedics;  Laterality: Right;   CORONARY ANGIOPLASTY WITH STENT PLACEMENT  12/30/2004   Patient reported   CORONARY ARTERY BYPASS GRAFT N/A 10/19/2017   Procedure: CORONARY ARTERY BYPASS GRAFTING (CABG) times four using the right saphaneous vein. Harvested endoscopicly and left internal mammary artery.;  Surgeon: Grace Isaac, MD;  Location: [facility];  Service: Open Heart Surgery;  Laterality: N/A;    CYSTOSCOPY N/A 07/09/2021   Procedure: CYSTOSCOPY;  Surgeon: [name], MD;  [facility];  Service: Gynecology;  Laterality: N/A;   DILATION AND CURETTAGE OF UTERUS  1980s   LEFT HEART CATH AND CORONARY ANGIOGRAPHY N/A 10/14/2017   Procedure: LEFT HEART CATH AND CORONARY ANGIOGRAPHY;  Surgeon: Martinique, Peter M, MD;  Location: [place] [facility];  Service: Cardiovascular;  Laterality: N/A;   LEFT HEART CATH AND CORS/GRAFTS ANGIOGRAPHY N/A 01/01/2018   Procedure: LEFT HEART CATH AND CORS/GRAFTS ANGIOGRAPHY;  Surgeon: Jettie Booze, MD;  Location: [place] [facility];  Service: Cardiovascular;  Laterality: N/A;   LEFT HEART CATH AND CORS/GRAFTS ANGIOGRAPHY N/A 08/16/2018   Procedure: LEFT HEART CATH AND CORS/GRAFTS ANGIOGRAPHY;  Surgeon: Nelva Bush, MD;  Location: [facility] [facility];  Service: Cardiovascular;  Laterality: N/A;   PILONIDAL CYST EXCISION  1980s   TEE WITHOUT CARDIOVERSION N/A 10/19/2017   Procedure: TRANSESOPHAGEAL ECHOCARDIOGRAM (TEE);  Surgeon: Grace Isaac, MD;  Location: [facility];  Service: Open Heart Surgery;  Laterality: N/A;   TRIGGER FINGER RELEASE Right 04/10/2021   Procedure: RIGHT LONG FINGER TRIGGER RELEASE;  Surgeon: Leandrew Koyanagi, MD;  Location: [place];  Service: Orthopedics;  Laterality: Right;   VAGINAL HYSTERECTOMY N/A 07/09/2021   Procedure: HYSTERECTOMY VAGINAL;  Surgeon: [name], MD;  [facility];  Service: Gynecology;  Laterality: N/A;  total time requested for  all procedures is 3 hours   VAGINAL PROLAPSE REPAIR N/A 07/09/2021   Procedure: VAGINAL VAULT SUSPENSION;  Surgeon: [name], MD;  [facility];  Service: Gynecology;  Laterality: N/A;    There were no vitals filed for this visit.   Subjective Assessment - 09/19/21 0929     Subjective "Im all right"    Currently in Pain? Yes    Pain Score 3     Pain Location Hip    Pain Orientation Right                [facility] PT Assessment -  09/19/21 0001       Strength   Right Hip Flexion 4-/5    Right Hip Extension 4-/5    Right Hip ABduction 4+/5    Right Hip ADduction 3+/5    Left Hip Flexion 4/5    Left Hip Extension 3+/5    Left Hip ABduction 4+/5    Left Hip ADduction 3+/5                           [facility] Adult PT Treatment/Exercise - 09/19/21 0001       Knee/Hip Exercises: Aerobic   Recumbent Bike L2.5 x 6 min      Knee/Hip Exercises: Machines for Strengthening   Cybex Knee Extension 10lb 2x10    Cybex Knee Flexion 25lb 2x10      Knee/Hip Exercises: Standing   Lateral Step Up Both;1 set;10 reps;Hand Hold: 0;Step Height: 6"    Forward Step Up Both;1 set;5 reps;Hand Hold: 0;Step Height: 6"    Walking with Sports Cord 30lb side steps x5 each      Knee/Hip Exercises: Seated   Sit to Sand 2 sets;10 reps;without UE support   Holding yellow ball     Knee/Hip Exercises: Supine   Bridges Strengthening;Both;1 set;10 reps    Straight Leg Raises Strengthening;Both;1 set;10 reps    Other Supine Knee/Hip Exercises Hip abd x5 each                       PT Short Term Goals - 09/19/21 0933       PT SHORT TERM GOAL #1   Title independent with initial HEP    Status Achieved               PT Long Term Goals - 09/19/21 0934       PT LONG TERM GOAL #2   Title report pain < 3/10 with activity for improved function    Status Partially Met                   Plan - 09/19/21 1008     Clinical Impression Statement Pt has progressed increasing bilateral hi strength but not enough to meet goal. She report an decrease hip pain overall upon entering clinic. Some lateral hip weakness exposed with resisted side steps. Some mild dizziness reported with step ups but went away after sitting. Cue for glute activation needed with sit to stands to bring hips forward.    Personal Factors and Comorbidities Comorbidity 3+    Comorbidities chronic LBP, CABG,MI,DM, hysterectomy Oct 2022     Examination-Activity Limitations Locomotion Level;Transfers;Sit;Squat;Stairs;Stand;Lift    Examination-Participation Restrictions Cleaning;Meal Prep;Community Activity;Occupation;Driving;Shop;Yard Work    Stability/Clinical Decision Making Stable/Uncomplicated    Rehab Potential Good    PT Frequency 2x / week    PT Treatment/Interventions ADLs/Self Care Home Management;Cryotherapy;Dealer  Stimulation;Traction;Moist Heat;Ultrasound;Gait training;Stair training;Functional mobility training;Therapeutic activities;Therapeutic exercise;Balance training;Neuromuscular re-education;Patient/family education;Manual techniques;Passive range of motion;Dry needling;Taping    PT Next Visit Plan progress hip/core strength, consider modalities/DN             Patient will benefit from skilled therapeutic intervention in order to improve the following deficits and impairments:  Abnormal gait, Increased fascial restricitons, Pain, Increased muscle spasms, Decreased strength, Impaired flexibility  Visit Diagnosis: Muscle weakness (generalized)  Pain in right hip  Pain in left hip     Problem List Patient Active Problem List   Diagnosis Date Noted   Chronic diarrhea 09/06/2021   Carpal tunnel syndrome on right    Trigger finger, right middle finger    Complete uterine prolapse 03/09/2021   Non-ST elevation (NSTEMI) myocardial infarction ([place]) 02/17/2018   Type 2 diabetes mellitus with complication, with long-term current use of insulin ([place]) 02/17/2018   Chest pain 01/01/2018   Unstable angina ([facility]) 01/01/2018   Arm contusion, left, initial encounter 11/25/2017   Contusion of right knee 11/25/2017   Hx of CABG 10/19/2017   Epigastric pain    Pancreatitis 10/10/2017   Hyperlipidemia LDL goal <70 09/14/2017   Essential hypertension 05/01/2017   Major depression, recurrent ([place]) 05/01/2017   Tobacco abuse 05/01/2017   History of MI (myocardial infarction) 05/01/2017   Allergic rhinitis  05/01/2017   History of arthritis 05/01/2017   Anxiety disorder, unspecified 05/01/2017   Bladder prolapse, female, acquired 11/09/2013    Scot Jun, PTA 09/19/2021, 10:11 AM  [facility]. [place], [place], [address] Phone: [phone]   Fax:  [phone]  Name: Victoria Eaton MRN: [id number] Date of Birth: Feb 28, 1961

## 2021-09-24 ENCOUNTER — Other Ambulatory Visit: Payer: Self-pay | Admitting: Family Medicine

## 2021-09-24 DIAGNOSIS — E118 Type 2 diabetes mellitus with unspecified complications: Secondary | ICD-10-CM

## 2021-09-25 ENCOUNTER — Ambulatory Visit: Payer: Commercial Managed Care - HMO | Attending: Orthopaedic Surgery | Admitting: Physical Therapy

## 2021-09-25 ENCOUNTER — Encounter: Payer: Self-pay | Admitting: Physical Therapy

## 2021-09-25 ENCOUNTER — Other Ambulatory Visit: Payer: Self-pay

## 2021-09-25 DIAGNOSIS — R2689 Other abnormalities of gait and mobility: Secondary | ICD-10-CM | POA: Diagnosis present

## 2021-09-25 DIAGNOSIS — M6281 Muscle weakness (generalized): Secondary | ICD-10-CM | POA: Diagnosis not present

## 2021-09-25 DIAGNOSIS — M25551 Pain in right hip: Secondary | ICD-10-CM | POA: Diagnosis present

## 2021-09-25 DIAGNOSIS — M25552 Pain in left hip: Secondary | ICD-10-CM

## 2021-09-25 NOTE — Therapy (Signed)
[facility]. [place], [place], [address] Phone: [phone]   Fax:  [phone]  Physical Therapy Treatment  Patient Details  Name: Victoria Eaton MRN: [id number] Date of Birth: December 03, 1960 Referring Provider (PT): Leandrew Koyanagi, MD   Encounter Date: 09/25/2021   PT End of Session - 09/25/21 1140     Visit Number 5    Date for PT Re-Evaluation 10/15/21    Authorization Type [employer] 20% coinsurance    PT Start Time 1105    PT Stop Time 6237    PT Time Calculation (min) 39 min             Past Medical History:  Diagnosis Date   Anxiety    Arthritis    "maybe in my left foot" (10/14/2017)   Bladder prolapse, female, acquired 09/14/2017   Coronary artery disease    Depression    High cholesterol    History of stomach ulcers    Hypertension    Myocardial infarction ([place]) 12/30/2004   Tobacco abuse    Type II diabetes mellitus ([place])     Past Surgical History:  Procedure Laterality Date   ANTERIOR AND POSTERIOR REPAIR N/A 07/09/2021   Procedure: ANTERIOR (CYSTOCELE) AND POSTERIOR REPAIR (RECTOCELE) with perineorrhaphy;  Surgeon: [name], MD;  [facility];  Service: Gynecology;  Laterality: N/A;   CARDIAC CATHETERIZATION  10/14/2017   CARPAL TUNNEL RELEASE Right 04/10/2021   Procedure: RIGHT CARPAL TUNNEL RELEASE;  Surgeon: Leandrew Koyanagi, MD;  Location: [facility];  Service: Orthopedics;  Laterality: Right;   CORONARY ANGIOPLASTY WITH STENT PLACEMENT  12/30/2004   Patient reported   CORONARY ARTERY BYPASS GRAFT N/A 10/19/2017   Procedure: CORONARY ARTERY BYPASS GRAFTING (CABG) times four using the right saphaneous vein. Harvested endoscopicly and left internal mammary artery.;  Surgeon: Grace Isaac, MD;  Location: [facility];  Service: Open Heart Surgery;  Laterality: N/A;   CYSTOSCOPY N/A 07/09/2021   Procedure: CYSTOSCOPY;  Surgeon: [name], MD;  [facility];   Service: Gynecology;  Laterality: N/A;   DILATION AND CURETTAGE OF UTERUS  1980s   LEFT HEART CATH AND CORONARY ANGIOGRAPHY N/A 10/14/2017   Procedure: LEFT HEART CATH AND CORONARY ANGIOGRAPHY;  Surgeon: Martinique, Peter M, MD;  Location: [facility];  Service: Cardiovascular;  Laterality: N/A;   LEFT HEART CATH AND CORS/GRAFTS ANGIOGRAPHY N/A 01/01/2018   Procedure: LEFT HEART CATH AND CORS/GRAFTS ANGIOGRAPHY;  Surgeon: Jettie Booze, MD;  Location: [facility];  Service: Cardiovascular;  Laterality: N/A;   LEFT HEART CATH AND CORS/GRAFTS ANGIOGRAPHY N/A 08/16/2018   Procedure: LEFT HEART CATH AND CORS/GRAFTS ANGIOGRAPHY;  Surgeon: Nelva Bush, MD;  Location: [facility];  Service: Cardiovascular;  Laterality: N/A;   PILONIDAL CYST EXCISION  1980s   TEE WITHOUT CARDIOVERSION N/A 10/19/2017   Procedure: TRANSESOPHAGEAL ECHOCARDIOGRAM (TEE);  Surgeon: Grace Isaac, MD;  Location: [facility];  Service: Open Heart Surgery;  Laterality: N/A;   TRIGGER FINGER RELEASE Right 04/10/2021   Procedure: RIGHT LONG FINGER TRIGGER RELEASE;  Surgeon: Leandrew Koyanagi, MD;  Location: [facility];  Service: Orthopedics;  Laterality: Right;   VAGINAL HYSTERECTOMY N/A 07/09/2021   Procedure: HYSTERECTOMY VAGINAL;  Surgeon: [name], MD;  [facility];  Service: Gynecology;  Laterality: N/A;  total time requested for all procedures is 3 hours   VAGINAL PROLAPSE REPAIR N/A 07/09/2021   Procedure: VAGINAL VAULT SUSPENSION;  Surgeon: [name], MD;  [facility];  Service: Gynecology;  Laterality: N/A;    There were no vitals filed for this visit.   Subjective Assessment - 09/25/21 1106     Subjective "Doing all right" has some pain earlier but it has resided    Pertinent History chronic LBP, CABG,MI,DM, hysterectomy Oct 2022    Currently in Pain? No/denies                               [facility] Adult PT Treatment/Exercise - 09/25/21 0001        Knee/Hip Exercises: Aerobic   Nustep L4 x 7mn      Knee/Hip Exercises: Machines for Strengthening   Cybex Knee Extension 10lb 2x12    Cybex Knee Flexion 25lb 2x12      Knee/Hip Exercises: Standing   Heel Raises Both;2 sets;15 reps;2 seconds    Lateral Step Up Both;1 set;10 reps;Hand Hold: 0;Step Height: 6"    Forward Step Up Both;1 set;10 reps;Hand Hold: 0;Step Height: 8"    Walking with Sports Cord 30lb side steps over .5 foam roll x10 each    Other Standing Knee Exercises Shoulder Ext for core 5lb 2x10      Knee/Hip Exercises: Seated   Sit to Sand 2 sets;10 reps   holding yellow ball                      PT Short Term Goals - 09/25/21 1140       PT SHORT TERM GOAL #1   Title ot               PT Long Term Goals - 09/25/21 1111       PT LONG TERM GOAL #1   Title independent with final HEP    Status Achieved      PT LONG TERM GOAL #2   Title report pain < 3/10 with activity for improved function    Status Partially Met      PT LONG TERM GOAL #4   Title demonstrate 4/5 bil hip strength for improved function    Status Partially Met                   Plan - 09/25/21 1140     Clinical Impression Statement Pt enters clinic reporting no pain. She did well progressing with the interventions. Cue for eccentric control needed with step ups. Pt had the most difficulty with resisted side step sober half foam roll more so with the resistance on her R side. LE fatigue reported with sit to stands, leg curls, and leg extensions. No reports of increase pain.    Personal Factors and Comorbidities Comorbidity 3+    Comorbidities chronic LBP, CABG,MI,DM, hysterectomy Oct 2022    Examination-Activity Limitations Locomotion Level;Transfers;Sit;Squat;Stairs;Stand;Lift    Examination-Participation Restrictions Cleaning;Meal Prep;Community Activity;Occupation;Driving;Shop;Yard Work    Stability/Clinical Decision Making Stable/Uncomplicated    Rehab  Potential Good    PT Frequency 2x / week    PT Treatment/Interventions ADLs/Self Care Home Management;Cryotherapy;Electrical Stimulation;Traction;Moist Heat;Ultrasound;Gait training;Stair training;Functional mobility training;Therapeutic activities;Therapeutic exercise;Balance training;Neuromuscular re-education;Patient/family education;Manual techniques;Passive range of motion;Dry needling;Taping    PT Next Visit Plan progress hip/core strength, consider modalities/DN             Patient will benefit from skilled therapeutic intervention in order to improve the following deficits and impairments:  Abnormal gait, Increased fascial restricitons, Pain, Increased muscle spasms,  Decreased strength, Impaired flexibility  Visit Diagnosis: Muscle weakness (generalized)  Pain in right hip  Pain in left hip  Other abnormalities of gait and mobility     Problem List Patient Active Problem List   Diagnosis Date Noted   Chronic diarrhea 09/06/2021   Carpal tunnel syndrome on right    Trigger finger, right middle finger    Complete uterine prolapse 03/09/2021   Non-ST elevation (NSTEMI) myocardial infarction ([place]) 02/17/2018   Type 2 diabetes mellitus with complication, with long-term current use of insulin ([place]) 02/17/2018   Chest pain 01/01/2018   Unstable angina ([place]) 01/01/2018   Arm contusion, left, initial encounter 11/25/2017   Contusion of right knee 11/25/2017   Hx of CABG 10/19/2017   Epigastric pain    Pancreatitis 10/10/2017   Hyperlipidemia LDL goal <70 09/14/2017   Essential hypertension 05/01/2017   Major depression, recurrent ([place]) 05/01/2017   Tobacco abuse 05/01/2017   History of MI (myocardial infarction) 05/01/2017   Allergic rhinitis 05/01/2017   History of arthritis 05/01/2017   Anxiety disorder, unspecified 05/01/2017   Bladder prolapse, female, acquired 11/09/2013    Scot Jun, PTA 09/25/2021, 11:44 AM  [facility]. [place], [place], [address] Phone: [phone]   Fax:  [phone]  Name: Charlesa Ehle MRN: [id number] Date of Birth: Dec 20, 1960

## 2021-09-27 ENCOUNTER — Encounter: Payer: Self-pay | Admitting: Physical Therapy

## 2021-09-27 ENCOUNTER — Other Ambulatory Visit: Payer: Self-pay

## 2021-09-27 ENCOUNTER — Ambulatory Visit: Payer: Commercial Managed Care - HMO | Admitting: Physical Therapy

## 2021-09-27 DIAGNOSIS — M6281 Muscle weakness (generalized): Secondary | ICD-10-CM | POA: Diagnosis not present

## 2021-09-27 DIAGNOSIS — M25551 Pain in right hip: Secondary | ICD-10-CM

## 2021-09-27 DIAGNOSIS — M25552 Pain in left hip: Secondary | ICD-10-CM

## 2021-09-27 NOTE — Therapy (Signed)
[facility]. [place], [place], [address] Phone: [phone]   Fax:  [phone]  Physical Therapy Treatment  Patient Details  Name: Mekaela Azizi MRN: [id number] Date of Birth: 02/01/1961 Referring Provider (PT): Leandrew Koyanagi, MD   Encounter Date: 09/27/2021   PT End of Session - 09/27/21 1005     Visit Number 6    Date for PT Re-Evaluation 10/15/21    PT Start Time 0928    PT Stop Time 1011    PT Time Calculation (min) 43 min    Activity Tolerance Patient tolerated treatment well    Behavior During Therapy [facility] for tasks assessed/performed             Past Medical History:  Diagnosis Date   Anxiety    Arthritis    "maybe in my left foot" (10/14/2017)   Bladder prolapse, female, acquired 09/14/2017   Coronary artery disease    Depression    High cholesterol    History of stomach ulcers    Hypertension    Myocardial infarction ([place]) 12/30/2004   Tobacco abuse    Type II diabetes mellitus ([place])     Past Surgical History:  Procedure Laterality Date   ANTERIOR AND POSTERIOR REPAIR N/A 07/09/2021   Procedure: ANTERIOR (CYSTOCELE) AND POSTERIOR REPAIR (RECTOCELE) with perineorrhaphy;  Surgeon: [name], MD;  [facility];  Service: Gynecology;  Laterality: N/A;   CARDIAC CATHETERIZATION  10/14/2017   CARPAL TUNNEL RELEASE Right 04/10/2021   Procedure: RIGHT CARPAL TUNNEL RELEASE;  Surgeon: Leandrew Koyanagi, MD;  Location: [facility];  Service: Orthopedics;  Laterality: Right;   CORONARY ANGIOPLASTY WITH STENT PLACEMENT  12/30/2004   Patient reported   CORONARY ARTERY BYPASS GRAFT N/A 10/19/2017   Procedure: CORONARY ARTERY BYPASS GRAFTING (CABG) times four using the right saphaneous vein. Harvested endoscopicly and left internal mammary artery.;  Surgeon: Grace Isaac, MD;  Location: [facility];  Service: Open Heart Surgery;  Laterality: N/A;   CYSTOSCOPY N/A 07/09/2021   Procedure: CYSTOSCOPY;   Surgeon: [name], MD;  [facility];  Service: Gynecology;  Laterality: N/A;   DILATION AND CURETTAGE OF UTERUS  1980s   LEFT HEART CATH AND CORONARY ANGIOGRAPHY N/A 10/14/2017   Procedure: LEFT HEART CATH AND CORONARY ANGIOGRAPHY;  Surgeon: Martinique, Peter M, MD;  Location: [facility];  Service: Cardiovascular;  Laterality: N/A;   LEFT HEART CATH AND CORS/GRAFTS ANGIOGRAPHY N/A 01/01/2018   Procedure: LEFT HEART CATH AND CORS/GRAFTS ANGIOGRAPHY;  Surgeon: Jettie Booze, MD;  Location: [facility];  Service: Cardiovascular;  Laterality: N/A;   LEFT HEART CATH AND CORS/GRAFTS ANGIOGRAPHY N/A 08/16/2018   Procedure: LEFT HEART CATH AND CORS/GRAFTS ANGIOGRAPHY;  Surgeon: Nelva Bush, MD;  Location: [facility];  Service: Cardiovascular;  Laterality: N/A;   PILONIDAL CYST EXCISION  1980s   TEE WITHOUT CARDIOVERSION N/A 10/19/2017   Procedure: TRANSESOPHAGEAL ECHOCARDIOGRAM (TEE);  Surgeon: Grace Isaac, MD;  Location: [facility];  Service: Open Heart Surgery;  Laterality: N/A;   TRIGGER FINGER RELEASE Right 04/10/2021   Procedure: RIGHT LONG FINGER TRIGGER RELEASE;  Surgeon: Leandrew Koyanagi, MD;  Location: [facility];  Service: Orthopedics;  Laterality: Right;   VAGINAL HYSTERECTOMY N/A 07/09/2021   Procedure: HYSTERECTOMY VAGINAL;  Surgeon: [name], MD;  [facility];  Service: Gynecology;  Laterality: N/A;  total time requested for all procedures is 3 hours   VAGINAL PROLAPSE  REPAIR N/A 07/09/2021   Procedure: VAGINAL VAULT SUSPENSION;  Surgeon: [name], MD;  [facility];  Service: Gynecology;  Laterality: N/A;    There were no vitals filed for this visit.   Subjective Assessment - 09/27/21 0930     Subjective "Im all right"    Currently in Pain? No/denies                               [facility] Adult PT Treatment/Exercise - 09/27/21 0001       Knee/Hip Exercises: Aerobic   Nustep L5 x 31mn       Knee/Hip Exercises: Machines for Strengthening   Cybex Knee Extension 10lb 2x15    Cybex Knee Flexion 25lb 2x15      Knee/Hip Exercises: Standing   Hip Abduction Both;1 set;Knee straight;10 reps    Abduction Limitations 5    Hip Extension Stengthening;Both;1 set;10 reps;Knee straight    Extension Limitations 5    Lateral Step Up Both;1 set;10 reps;Hand Hold: 0;Step Height: 6"    Forward Step Up Both;1 set;10 reps;Hand Hold: 0;Step Height: 8"    Walking with Sports Cord 40lb side steps x5 each      Knee/Hip Exercises: Seated   Ball Squeeze 2x10    Sit to [employer] sets;10 reps   LE on airex OHP w/ yellow ball                      PT Short Term Goals - 09/27/21 1009       PT SHORT TERM GOAL #1   Title I with HEP    Status Achieved               PT Long Term Goals - 09/25/21 1111       PT LONG TERM GOAL #1   Title independent with final HEP    Status Achieved      PT LONG TERM GOAL #2   Title report pain < 3/10 with activity for improved function    Status Partially Met      PT LONG TERM GOAL #4   Title demonstrate 4/5 bil hip strength for improved function    Status Partially Met                   Plan - 09/27/21 1005     Clinical Impression Statement Pt continues to improve overall. She reports pain in the morning upon waking up but that goes away as she moves. Cue to keep distance between feet with resisted side steps to aid in maintaining balance. Some fatigue noted with step ups and sit to stands. Increase reps tolerated with leg curls and extensions. No reports of pain during session.    Personal Factors and Comorbidities Comorbidity 3+    Comorbidities chronic LBP, CABG,MI,DM, hysterectomy Oct 2022    Examination-Activity Limitations Locomotion Level;Transfers;Sit;Squat;Stairs;Stand;Lift    Examination-Participation Restrictions Cleaning;Meal Prep;Community Activity;Occupation;Driving;Shop;Yard Work    Rehab Potential Good    PT  Frequency 2x / week    PT Duration 6 weeks    PT Treatment/Interventions ADLs/Self Care Home Management;Cryotherapy;Electrical Stimulation;Traction;Moist Heat;Ultrasound;Gait training;Stair training;Functional mobility training;Therapeutic activities;Therapeutic exercise;Balance training;Neuromuscular re-education;Patient/family education;Manual techniques;Passive range of motion;Dry needling;Taping             Patient will benefit from skilled therapeutic intervention in order to improve the following deficits and impairments:  Abnormal gait, Increased fascial restricitons, Pain, Increased muscle spasms,  Decreased strength, Impaired flexibility  Visit Diagnosis: Muscle weakness (generalized)  Pain in left hip  Pain in right hip     Problem List Patient Active Problem List   Diagnosis Date Noted   Chronic diarrhea 09/06/2021   Carpal tunnel syndrome on right    Trigger finger, right middle finger    Complete uterine prolapse 03/09/2021   Non-ST elevation (NSTEMI) myocardial infarction ([place]) 02/17/2018   Type 2 diabetes mellitus with complication, with long-term current use of insulin ([place]) 02/17/2018   Chest pain 01/01/2018   Unstable angina ([place]) 01/01/2018   Arm contusion, left, initial encounter 11/25/2017   Contusion of right knee 11/25/2017   Hx of CABG 10/19/2017   Epigastric pain    Pancreatitis 10/10/2017   Hyperlipidemia LDL goal <70 09/14/2017   Essential hypertension 05/01/2017   Major depression, recurrent ([place]) 05/01/2017   Tobacco abuse 05/01/2017   History of MI (myocardial infarction) 05/01/2017   Allergic rhinitis 05/01/2017   History of arthritis 05/01/2017   Anxiety disorder, unspecified 05/01/2017   Bladder prolapse, female, acquired 11/09/2013    Scot Jun, PTA 09/27/2021, 10:09 AM  [facility]. [place], [place], [address] Phone: [phone]   Fax:  [phone]  Name:  Yacine Droz MRN: [id number] Date of Birth: [birth year]

## 2021-10-01 ENCOUNTER — Encounter: Payer: Self-pay | Admitting: Physical Therapy

## 2021-10-01 ENCOUNTER — Other Ambulatory Visit: Payer: Self-pay

## 2021-10-01 ENCOUNTER — Ambulatory Visit: Payer: Commercial Managed Care - HMO | Admitting: Physical Therapy

## 2021-10-01 DIAGNOSIS — M6281 Muscle weakness (generalized): Secondary | ICD-10-CM

## 2021-10-01 DIAGNOSIS — M25552 Pain in left hip: Secondary | ICD-10-CM

## 2021-10-01 DIAGNOSIS — M25551 Pain in right hip: Secondary | ICD-10-CM

## 2021-10-01 NOTE — Therapy (Signed)
[facility]. [place], [place], [address] Phone: [phone]   Fax:  [phone]  Physical Therapy Treatment  Patient Details  Name: Victoria Eaton MRN: [id number] Date of Birth: Aug 07, 1961 Referring Provider (PT): Leandrew Koyanagi, MD   Encounter Date: 10/01/2021   PT End of Session - 10/01/21 1135     Visit Number 7    Date for PT Re-Evaluation 10/15/21    Authorization Type [employer] 20% coinsurance    PT Start Time 1100    PT Stop Time 2947    PT Time Calculation (min) 35 min    Activity Tolerance Patient tolerated treatment well    Behavior During Therapy [facility] for tasks assessed/performed             Past Medical History:  Diagnosis Date   Anxiety    Arthritis    "maybe in my left foot" (10/14/2017)   Bladder prolapse, female, acquired 09/14/2017   Coronary artery disease    Depression    High cholesterol    History of stomach ulcers    Hypertension    Myocardial infarction ([place]) 12/30/2004   Tobacco abuse    Type II diabetes mellitus ([place])     Past Surgical History:  Procedure Laterality Date   ANTERIOR AND POSTERIOR REPAIR N/A 07/09/2021   Procedure: ANTERIOR (CYSTOCELE) AND POSTERIOR REPAIR (RECTOCELE) with perineorrhaphy;  Surgeon: [name], MD;  [facility];  Service: Gynecology;  Laterality: N/A;   CARDIAC CATHETERIZATION  10/14/2017   CARPAL TUNNEL RELEASE Right 04/10/2021   Procedure: RIGHT CARPAL TUNNEL RELEASE;  Surgeon: Leandrew Koyanagi, MD;  Location: [facility];  Service: Orthopedics;  Laterality: Right;   CORONARY ANGIOPLASTY WITH STENT PLACEMENT  12/30/2004   Patient reported   CORONARY ARTERY BYPASS GRAFT N/A 10/19/2017   Procedure: CORONARY ARTERY BYPASS GRAFTING (CABG) times four using the right saphaneous vein. Harvested endoscopicly and left internal mammary artery.;  Surgeon: Grace Isaac, MD;  Location: [facility];  Service: Open Heart Surgery;  Laterality: N/A;    CYSTOSCOPY N/A 07/09/2021   Procedure: CYSTOSCOPY;  Surgeon: [name], MD;  [facility];  Service: Gynecology;  Laterality: N/A;   DILATION AND CURETTAGE OF UTERUS  1980s   LEFT HEART CATH AND CORONARY ANGIOGRAPHY N/A 10/14/2017   Procedure: LEFT HEART CATH AND CORONARY ANGIOGRAPHY;  Surgeon: Martinique, Peter M, MD;  Location: [facility];  Service: Cardiovascular;  Laterality: N/A;   LEFT HEART CATH AND CORS/GRAFTS ANGIOGRAPHY N/A 01/01/2018   Procedure: LEFT HEART CATH AND CORS/GRAFTS ANGIOGRAPHY;  Surgeon: Jettie Booze, MD;  Location: [facility];  Service: Cardiovascular;  Laterality: N/A;   LEFT HEART CATH AND CORS/GRAFTS ANGIOGRAPHY N/A 08/16/2018   Procedure: LEFT HEART CATH AND CORS/GRAFTS ANGIOGRAPHY;  Surgeon: Nelva Bush, MD;  Location: [facility];  Service: Cardiovascular;  Laterality: N/A;   PILONIDAL CYST EXCISION  1980s   TEE WITHOUT CARDIOVERSION N/A 10/19/2017   Procedure: TRANSESOPHAGEAL ECHOCARDIOGRAM (TEE);  Surgeon: Grace Isaac, MD;  Location: [facility];  Service: Open Heart Surgery;  Laterality: N/A;   TRIGGER FINGER RELEASE Right 04/10/2021   Procedure: RIGHT LONG FINGER TRIGGER RELEASE;  Surgeon: Leandrew Koyanagi, MD;  Location: [facility];  Service: Orthopedics;  Laterality: Right;   VAGINAL HYSTERECTOMY N/A 07/09/2021   Procedure: HYSTERECTOMY VAGINAL;  Surgeon: [name], MD;  [facility];  Service: Gynecology;  Laterality: N/A;  total time requested for  all procedures is 3 hours   VAGINAL PROLAPSE REPAIR N/A 07/09/2021   Procedure: VAGINAL VAULT SUSPENSION;  Surgeon: [name], MD;  [facility];  Service: Gynecology;  Laterality: N/A;    There were no vitals filed for this visit.   Subjective Assessment - 10/01/21 1104     Subjective "I don't know, everything hurts last night, then I took oxy" "Feeling icky from the oxy"    Currently in Pain? Yes    Pain Score 3     Pain Location --    shoulders, legs                              [facility] Adult PT Treatment/Exercise - 10/01/21 0001       Knee/Hip Exercises: Aerobic   Recumbent Bike L2.5 x 6 min      Knee/Hip Exercises: Machines for Strengthening   Cybex Leg Press 40lb 2x15    Other Machine Rows & lats 25lb 2x10      Knee/Hip Exercises: Standing   Heel Raises Both;2 sets;15 reps;2 seconds    Lateral Step Up Both;1 set;10 reps;Hand Hold: 0;Step Height: 8"    Forward Step Up Both;1 set;10 reps;Hand Hold: 0;Step Height: 8"    Walking with Sports Cord 50lb side steps x5 each                       PT Short Term Goals - 09/27/21 1009       PT SHORT TERM GOAL #1   Title I with HEP    Status Achieved               PT Long Term Goals - 09/25/21 1111       PT LONG TERM GOAL #1   Title independent with final HEP    Status Achieved      PT LONG TERM GOAL #2   Title report pain < 3/10 with activity for improved function    Status Partially Met      PT LONG TERM GOAL #4   Title demonstrate 4/5 bil hip strength for improved function    Status Partially Met                   Plan - 10/01/21 1135     Clinical Impression Statement No issue completing today's interventions despite not feeling her best. Some LE weakness noted with forward and lateral step ups. increase reps and weight tolerated on leg press and with resisted sie steps. Postural cue required for seated rows and lats.    Personal Factors and Comorbidities Comorbidity 3+    Comorbidities chronic LBP, CABG,MI,DM, hysterectomy Oct 2022    Examination-Activity Limitations Locomotion Level;Transfers;Sit;Squat;Stairs;Stand;Lift    Examination-Participation Restrictions Cleaning;Meal Prep;Community Activity;Occupation;Driving;Shop;Yard Work    Stability/Clinical Decision Making Stable/Uncomplicated    Rehab Potential Good    PT Frequency 2x / week    PT Duration 6 weeks    PT Treatment/Interventions ADLs/Self  Care Home Management;Cryotherapy;Electrical Stimulation;Traction;Moist Heat;Ultrasound;Gait training;Stair training;Functional mobility training;Therapeutic activities;Therapeutic exercise;Balance training;Neuromuscular re-education;Patient/family education;Manual techniques;Passive range of motion;Dry needling;Taping    PT Next Visit Plan progress hip/core strength, consider modalities/DN             Patient will benefit from skilled therapeutic intervention in order to improve the following deficits and impairments:  Abnormal gait, Increased fascial restricitons, Pain, Increased muscle spasms, Decreased strength, Impaired flexibility  Visit Diagnosis: Muscle weakness (  generalized)  Pain in right hip  Pain in left hip     Problem List Patient Active Problem List   Diagnosis Date Noted   Chronic diarrhea 09/06/2021   Carpal tunnel syndrome on right    Trigger finger, right middle finger    Complete uterine prolapse 03/09/2021   Non-ST elevation (NSTEMI) myocardial infarction ([place]) 02/17/2018   Type 2 diabetes mellitus with complication, with long-term current use of insulin ([place]) 02/17/2018   Chest pain 01/01/2018   Unstable angina ([place]) 01/01/2018   Arm contusion, left, initial encounter 11/25/2017   Contusion of right knee 11/25/2017   Hx of CABG 10/19/2017   Epigastric pain    Pancreatitis 10/10/2017   Hyperlipidemia LDL goal <70 09/14/2017   Essential hypertension 05/01/2017   Major depression, recurrent ([place]) 05/01/2017   Tobacco abuse 05/01/2017   History of MI (myocardial infarction) 05/01/2017   Allergic rhinitis 05/01/2017   History of arthritis 05/01/2017   Anxiety disorder, unspecified 05/01/2017   Bladder prolapse, female, acquired 11/09/2013    Scot Jun, PTA 10/01/2021, 11:37 AM  [place]. [place], [place], [address] Phone: [phone]   Fax:  [phone]  Name: Victoria Eaton MRN: [id number] Date of Birth: 1961/04/12

## 2021-10-02 ENCOUNTER — Encounter: Payer: Self-pay | Admitting: Family Medicine

## 2021-10-04 ENCOUNTER — Ambulatory Visit: Payer: Managed Care, Other (non HMO) | Admitting: Physician Assistant

## 2021-10-04 ENCOUNTER — Telehealth: Payer: Self-pay

## 2021-10-04 ENCOUNTER — Other Ambulatory Visit: Payer: Self-pay

## 2021-10-04 ENCOUNTER — Encounter: Payer: Self-pay | Admitting: Physician Assistant

## 2021-10-04 VITALS — BP 110/64 | HR 68 | Ht 66.0 in | Wt 194.8 lb

## 2021-10-04 DIAGNOSIS — K529 Noninfective gastroenteritis and colitis, unspecified: Secondary | ICD-10-CM

## 2021-10-04 DIAGNOSIS — Z7901 Long term (current) use of anticoagulants: Secondary | ICD-10-CM | POA: Diagnosis not present

## 2021-10-04 DIAGNOSIS — Z951 Presence of aortocoronary bypass graft: Secondary | ICD-10-CM

## 2021-10-04 MED ORDER — PLENVU 140 G PO SOLR: 140.0000 g | ORAL | 0 refills | Status: DC

## 2021-10-04 NOTE — Progress Notes (Signed)
Agree with the assessment and plan as outlined by Victoria Newer, Victoria Eaton.  Given chronicity of symptoms, if she has not had an upper endoscopy done at the same time as colonoscopy to rule out Celiac Disease, upper tract Crohn's (reports a history of ulcers), etc. Doing stool testing now is also reasonable to include GI PCR panel, fecal calprotectin, fecal pancreatic elastase, along with inflammatory markers ESR, CRP.  Victoria Zeiter, DO, [facility]

## 2021-10-04 NOTE — Progress Notes (Signed)
Patient has been notified of the recommendations given. She agrees to the plan changes, she will come back next week to collect the specimen kit and do labs.

## 2021-10-04 NOTE — Telephone Encounter (Signed)
° °  Name: Victoria Eaton  DOB: 13-Dec-1960  MRN: [id number]  Primary Cardiologist: Pixie Casino, MD  Chart reviewed as part of pre-operative protocol coverage. Because of [name]'s past medical history and time since last visit, she will require a follow-up visit in order to better assess preoperative cardiovascular risk.  Pre-op covering staff: - Please schedule appointment and call patient to inform them. If patient already had an upcoming appointment within acceptable timeframe, please add "pre-op clearance" to the appointment notes so provider is aware. - Please contact requesting surgeon's office via preferred method (i.e, phone, fax) to inform them of need for appointment prior to surgery.  If applicable, this message will also be routed to pharmacy pool and/or primary cardiologist for input on holding anticoagulant/antiplatelet agent as requested below so that this information is available to the clearing provider at time of patient's appointment.   Since patient will need to hold plavix for 5 days, appt need to be before 10/23/2021 to allow adequate time to hold plavix.   [place], [place]  10/04/2021, 4:45 PM

## 2021-10-04 NOTE — Progress Notes (Signed)
Chief Complaint: Diarrhea  HPI:    Victoria Eaton is a 61 year old Caucasian female with a past medical history of CAD on Plavix status post stent 2019, type 2 diabetes and others listed below, who was referred to me by Martinique, Betty G, MD for a complaint of diarrhea.    Per chart review looks like patient previously followed with Dr. Collene Mares for a colonoscopy.  We cannot see results.    Today, the patient presents to clinic and tells me that in her use that she recalls having good bowel movements waking up in the morning and having a solid stool and that was it but she thinks it started to change towards more diarrhea in the early 2000's, around the age of 71.  Tells me since then she has had chronic diarrhea she will typically wake up in the morning and have a more solid stool but then it seems to get worse as the day goes on and more liquidy with 10 or more bowel movements throughout the day.  Sometimes it is even waking her at night.  Describes her first colonoscopy here in [place] back in 2000 and thinks she may have been told she had "ulcers", remembers being given "big pills", but only took these once and has not taken them since.  She had a repeat colonoscopy when in [place] in 2015 but cannot recall who did this or what they told her than either.  Over the past 8 years she has just been using Imodium as needed for diarrhea until it stops.  Tells me she will take at least 3 or 4 or sometimes more tabs a day.  Sometimes she can get it to stop for a day or at least through the evening.  She takes Imodium like "tic tacs".  Associated symptoms include occasional lower abdominal discomfort with bowel movements/before bowel movements.  Things did get slightly better she tells me when she had her bladder tacked up in October of this past year.    Denies fever, chills, weight loss, blood in her stool, nausea, vomiting, heartburn or reflux.  Past Medical History:  Diagnosis Date   Anxiety    Arthritis     "maybe in my left foot" (10/14/2017)   Bladder prolapse, female, acquired 09/14/2017   Coronary artery disease    Depression    High cholesterol    History of stomach ulcers    Hypertension    Myocardial infarction ([place]) 12/30/2004   Tobacco abuse    Type II diabetes mellitus ([place])     Past Surgical History:  Procedure Laterality Date   ANTERIOR AND POSTERIOR REPAIR N/A 07/09/2021   Procedure: ANTERIOR (CYSTOCELE) AND POSTERIOR REPAIR (RECTOCELE) with perineorrhaphy;  Surgeon: [name], MD;  Location: [facility];  Service: Gynecology;  Laterality: N/A;   CARDIAC CATHETERIZATION  10/14/2017   CARPAL TUNNEL RELEASE Right 04/10/2021   Procedure: RIGHT CARPAL TUNNEL RELEASE;  Surgeon: Leandrew Koyanagi, MD;  Location: [facility];  Service: Orthopedics;  Laterality: Right;   CORONARY ANGIOPLASTY WITH STENT PLACEMENT  12/30/2004   Patient reported   CORONARY ARTERY BYPASS GRAFT N/A 10/19/2017   Procedure: CORONARY ARTERY BYPASS GRAFTING (CABG) times four using the right saphaneous vein. Harvested endoscopicly and left internal mammary artery.;  Surgeon: Grace Isaac, MD;  Location: [facility];  Service: Open Heart Surgery;  Laterality: N/A;   CYSTOSCOPY N/A 07/09/2021   Procedure: CYSTOSCOPY;  Surgeon: [name], MD;  Location: [facility];  Service: Gynecology;  Laterality: N/A;   DILATION AND CURETTAGE OF UTERUS  1980s   LEFT HEART CATH AND CORONARY ANGIOGRAPHY N/A 10/14/2017   Procedure: LEFT HEART CATH AND CORONARY ANGIOGRAPHY;  Surgeon: Martinique, Peter M, MD;  Location: [facility];  Service: Cardiovascular;  Laterality: N/A;   LEFT HEART CATH AND CORS/GRAFTS ANGIOGRAPHY N/A 01/01/2018   Procedure: LEFT HEART CATH AND CORS/GRAFTS ANGIOGRAPHY;  Surgeon: Jettie Booze, MD;  Location: [facility];  Service: Cardiovascular;  Laterality: N/A;   LEFT HEART CATH AND CORS/GRAFTS ANGIOGRAPHY N/A 08/16/2018   Procedure: LEFT HEART CATH AND CORS/GRAFTS ANGIOGRAPHY;   Surgeon: Nelva Bush, MD;  Location: [facility];  Service: Cardiovascular;  Laterality: N/A;   PILONIDAL CYST EXCISION  1980s   TEE WITHOUT CARDIOVERSION N/A 10/19/2017   Procedure: TRANSESOPHAGEAL ECHOCARDIOGRAM (TEE);  Surgeon: Grace Isaac, MD;  Location: [facility];  Service: Open Heart Surgery;  Laterality: N/A;   TRIGGER FINGER RELEASE Right 04/10/2021   Procedure: RIGHT LONG FINGER TRIGGER RELEASE;  Surgeon: Leandrew Koyanagi, MD;  Location: [facility];  Service: Orthopedics;  Laterality: Right;   VAGINAL HYSTERECTOMY N/A 07/09/2021   Procedure: HYSTERECTOMY VAGINAL;  Surgeon: [name], MD;  Location: [facility];  Service: Gynecology;  Laterality: N/A;  total time requested for all procedures is 3 hours   VAGINAL PROLAPSE REPAIR N/A 07/09/2021   Procedure: VAGINAL VAULT SUSPENSION;  Surgeon: [name], MD;  Location: [facility];  Service: Gynecology;  Laterality: N/A;    Current Outpatient Medications  Medication Sig Dispense Refill   acetaminophen (TYLENOL) 500 MG tablet Take 1 tablet (500 mg total) by mouth every 6 (six) hours as needed (pain). 30 tablet 0   amLODipine (NORVASC) 5 MG tablet TAKE ONE TABLET BY MOUTH ONE TIME DAILY 90 tablet 3   aspirin 81 MG EC tablet Take 81 mg by mouth daily.     buPROPion (WELLBUTRIN XL) 300 MG 24 hr tablet TAKE ONE TABLET BY MOUTH ONE TIME DAILY 90 tablet 2   clopidogrel (PLAVIX) 75 MG tablet Take 1 tablet (75 mg total) by mouth daily. 90 tablet 2   Continuous Blood Gluc Sensor (FREESTYLE LIBRE 14 DAY SENSOR) MISC APPLY ONE SENSOR TO THE BACK OF YOUR UPPER ARM. REPLACE EVERY 14 DAYS. 1 each 5   dapagliflozin propanediol (FARXIGA) 5 MG TABS tablet Take 1 tablet (5 mg total) by mouth daily before breakfast. 90 tablet 3   Dulaglutide (TRULICITY) 1.5 ZO/1.0RU SOPN Inject 1.5 mg into the skin once a week. (Patient taking differently: Inject 1.5 mg into the skin every Tuesday.) 0.5 mL 6   ibuprofen (ADVIL) 600 MG tablet Take 1 tablet  (600 mg total) by mouth every 6 (six) hours as needed. 30 tablet 0   insulin aspart (NOVOLOG FLEXPEN) 100 UNIT/ML FlexPen Inject 5 Units into the skin 3 (three) times daily with meals as needed for high blood sugar.     Insulin Pen Needle (ULTICARE MICRO PEN NEEDLES) 32G X 4 MM MISC USE WITH INJECTIONS ONE TIME DAILY 400 each 0   Iron, Ferrous Sulfate, 325 (65 Fe) MG TABS Take 325 mg by mouth daily. 90 tablet 2   isosorbide mononitrate (IMDUR) 120 MG 24 hr tablet Take 1 tablet (120 mg total) by mouth daily. 90 tablet 3   lisinopril (ZESTRIL) 5 MG tablet TAKE ONE TABLET BY MOUTH ONE TIME DAILY 90 tablet 1   loratadine (CLARITIN) 10 MG tablet Take 10 mg by mouth daily.  methocarbamol (ROBAXIN) 500 MG tablet Take 1 tablet (500 mg total) by mouth 2 (two) times daily as needed. 20 tablet 0   metoprolol tartrate (LOPRESSOR) 50 MG tablet TAKE ONE TABLET BY MOUTH TWICE A DAY 180 tablet 2   Multiple Vitamin (MULTIVITAMIN WITH MINERALS) TABS tablet Take 1 tablet by mouth daily.     nitroGLYCERIN (NITROSTAT) 0.4 MG SL tablet Place 0.4 mg under the tongue every 5 (five) minutes as needed for chest pain.     Omega-3 Fatty Acids (FISH OIL) 1200 MG CAPS Take 1,200 mg by mouth daily.     oxyCODONE (OXY IR/ROXICODONE) 5 MG immediate release tablet Take 1 tablet (5 mg total) by mouth every 4 (four) hours as needed for severe pain. 15 tablet 0   polyethylene glycol powder (GLYCOLAX/MIRALAX) 17 GM/SCOOP powder Take 17 g by mouth daily. Drink 17g (1 scoop) dissolved in water per day. 255 g 0   rosuvastatin (CRESTOR) 40 MG tablet Take 1 tablet (40 mg total) by mouth daily. 90 tablet 3   sertraline (ZOLOFT) 100 MG tablet TAKE TWO TABLETS BY MOUTH ONE TIME DAILY 180 tablet 2   TOUJEO MAX SOLOSTAR 300 UNIT/ML Solostar Pen INJECT 48 UNITS INTO THE SKIN DAILY 30 mL 2   Turmeric 500 MG CAPS Take 1,000 mg by mouth daily.     No current facility-administered medications for this visit.    Allergies as of 10/04/2021 -  Review Complete 10/01/2021  Allergen Reaction Noted   Januvia [sitagliptin] Nausea And Vomiting and Other (See Comments) 12/16/2017   Metformin and related Diarrhea 12/16/2017   Penicillins Other (See Comments) 05/01/2017    Family History  Problem Relation Age of Onset   Heart disease Father    Hyperlipidemia Father    Hypertension Father     Social History   Socioeconomic History   Marital status: Widowed    Spouse name: Glendell Docker   Number of children: 1   Years of education: Not on file   Highest education level: Not on file  Occupational History   Not on file  Tobacco Use   Smoking status: Some Days    Packs/day: 0.20    Years: 45.00    Pack years: 9.00    Types: Cigarettes    Start date: 05/01/1972    Last attempt to quit: 09/30/2017    Years since quitting: 4.0   Smokeless tobacco: Never  Vaping Use   Vaping Use: Never used  Substance and Sexual Activity   Alcohol use: Yes    Alcohol/week: 7.0 standard drinks    Types: 7 Standard drinks or equivalent per week    Comment: daily: 2-3 wine   Drug use: No   Sexual activity: Not Currently  Other Topics Concern   Not on file  Social History Narrative   Not on file   Social Determinants of Health   Financial Resource Strain: Not on file  Food Insecurity: Not on file  Transportation Needs: Not on file  Physical Activity: Not on file  Stress: Not on file  Social Connections: Not on file  Intimate Partner Violence: Not on file    Review of Systems:    Constitutional: No weight loss, fever or chills Skin: No rash  Cardiovascular: No chest pain  Respiratory: No SOB  Gastrointestinal: See HPI and otherwise negative Genitourinary: No dysuria  Neurological: No headache, dizziness or syncope Musculoskeletal: No new muscle or joint pain Hematologic: No bleeding or bruising Psychiatric: No history of depression or anxiety  Physical Exam:  Vital signs: BP 110/64    Pulse 68    Ht 5' 6"  (1.676 m)    Wt 194 lb 12.8  oz (88.4 kg)    BMI 31.44 kg/m    Constitutional:   Pleasant Caucasian female appears to be in NAD, Well developed, Well nourished, alert and cooperative Head:  Normocephalic and atraumatic. Eyes:   PEERL, EOMI. No icterus. Conjunctiva pink. Ears:  Normal auditory acuity. Neck:  Supple Throat: Oral cavity and pharynx without inflammation, swelling or lesion.  Respiratory: Respirations even and unlabored. Lungs clear to auscultation bilaterally.   No wheezes, crackles, or rhonchi.  Cardiovascular: Normal S1, S2. No MRG. Regular rate and rhythm. No peripheral edema, cyanosis or pallor.  Gastrointestinal:  Soft, nondistended, nontender. No rebound or guarding. Normal bowel sounds. No appreciable masses or hepatomegaly. Rectal:  Not performed.  Msk:  Symmetrical without gross deformities. Without edema, no deformity or joint abnormality.  Neurologic:  Alert and  oriented x4;  grossly normal neurologically.  Skin:   Dry and intact without significant lesions or rashes. Psychiatric: Demonstrates good judgement and reason without abnormal affect or behaviors.  RELEVANT LABS AND IMAGING: CBC    Component Value Date/Time   WBC 7.1 07/03/2021 1138   RBC 4.62 07/03/2021 1138   HGB 11.4 (L) 07/03/2021 1138   HGB 12.0 03/19/2020 1252   HCT 36.8 07/03/2021 1138   HCT 39.4 03/19/2020 1252   PLT 180 07/03/2021 1138   PLT 206 05/01/2017 0939   MCV 79.7 (L) 07/03/2021 1138   MCV 82 03/19/2020 1252   MCH 24.7 (L) 07/03/2021 1138   MCHC 31.0 07/03/2021 1138   RDW 19.5 (H) 07/03/2021 1138   RDW 17.3 (H) 03/19/2020 1252   LYMPHSABS 1.6 07/03/2021 1138   LYMPHSABS 1.7 03/19/2020 1252   MONOABS 0.5 07/03/2021 1138   EOSABS 0.3 07/03/2021 1138   EOSABS 0.2 03/19/2020 1252   BASOSABS 0.1 07/03/2021 1138   BASOSABS 0.1 03/19/2020 1252    CMP     Component Value Date/Time   NA 139 07/03/2021 1138   NA 139 03/01/2020 1201   K 4.3 07/03/2021 1138   CL 106 07/03/2021 1138   CO2 25 07/03/2021  1138   GLUCOSE 201 (H) 07/03/2021 1138   BUN 24 (H) 07/03/2021 1138   BUN 21 03/01/2020 1201   CREATININE 1.07 (H) 07/03/2021 1138   CALCIUM 9.4 07/03/2021 1138   PROT 7.4 03/14/2021 0855   PROT 7.5 03/01/2020 1201   ALBUMIN 3.9 03/14/2021 0855   ALBUMIN 4.2 03/01/2020 1201   AST 19 03/14/2021 0855   ALT 17 03/14/2021 0855   ALKPHOS 66 03/14/2021 0855   BILITOT 0.4 03/14/2021 0855   BILITOT 0.3 03/01/2020 1201   GFRNONAA 59 (L) 07/03/2021 1138   GFRAA 53 (L) 03/01/2020 1201    Assessment: 1.  Chronic diarrhea: Since the early 2000's, 2 prior colonoscopy she thinks a diagnosis of "ulcers", currently only using Imodium has 10+ bowel movements a day and sometimes at night, last colonoscopy in 2015 in [place] (does not know who or where), does have solid stool in the morning followed by loose stool throughout the day and evening; consider IBD versus microscopic colitis versus IBS 2.  Chronic anticoagulation for CAD status post stenting: On Plavix  Plan: 1.  Deferred stool studies for now as patient does have a solid bowel movement in the morning. 2.  Scheduled patient for diagnostic colonoscopy in the [facility] with Dr. Bryan Lemma.  Did provide the  patient a detailed list of risks for the procedure and she agrees to proceed. Patient is appropriate for endoscopic procedure(s) in the ambulatory ([place]) setting.  3.  Advised patient hold her Plavix for 5 days prior to time of procedure.  We will contact her cardiologist to ensure that holding her Plavix is acceptable for her. 4.  For now continue Imodium as needed.  In the future we will discuss medicines based on colonoscopy. 5.  We will attempt to get records from Dr. Collene Mares regarding previous colonoscopy 6.  Patient will need to follow in clinic with [place] after time of colonoscopy.  We will allow Dr. Bryan Lemma to make that appointment after it is done.  Ellouise Newer, PA-C [facility] 10/04/2021, 11:36 AM  Cc: Martinique, Betty G, MD

## 2021-10-04 NOTE — Patient Instructions (Signed)
If you are age 63 or older, your body mass index should be between 23-30. Your Body mass index is 31.44 kg/m. If this is out of the aforementioned range listed, please consider follow up with your Primary Care Provider.  If you are age 14 or younger, your body mass index should be between 19-25. Your Body mass index is 31.44 kg/m. If this is out of the aformentioned range listed, please consider follow up with your Primary Care Provider.   ________________________________________________________  [facility] providers would like to encourage you to use [facility] to communicate with providers for non-urgent requests or questions.  Due to long hold times on the telephone, sending your provider a message by [facility] may be a faster and more efficient way to get a response.  Please allow 48 business hours for a response.  Please remember that this is for non-urgent requests.  _______________________________________________________  Dennis Bast have been scheduled for a colonoscopy. Please follow written instructions given to you at your visit today.  Please pick up your prep supplies at the pharmacy within the next 1-3 days. If you use inhalers (even only as needed), please bring them with you on the day of your procedure.  Due to recent changes in healthcare laws, you may see the results of your imaging and laboratory studies on MyChart before your provider has had a chance to review them.  We understand that in some cases there may be results that are confusing or concerning to you. Not all laboratory results come back in the same time frame and the provider may be waiting for multiple results in order to interpret others.  Please give [place] 48 hours in order for your provider to thoroughly review all the results before contacting the office for clarification of your results.   You will be contacted by our office prior to your procedure for directions on holding your Plavix.  If you do not hear from our office 1  week prior to your scheduled procedure, please call [phone] to discuss.   It was a pleasure to see you today!  Thank you for trusting me with your gastrointestinal care!

## 2021-10-04 NOTE — Telephone Encounter (Signed)
[facility] Pre-operative Risk Assessment     Request for surgical clearance:     Endoscopy Procedure  What type of surgery is being performed?    Colonoscopy  When is this surgery scheduled?     10-28-2021  What type of clearance is required ?   Pharmacy  Are there any medications that need to be held prior to surgery and how long? Yes, Plavix, 5 days prior   Practice name and name of physician performing surgery?      [facility]/ Dr Lucille Passy   What is your office phone and fax number?      Phone- [phone]  [phone]  Anesthesia type (None, local, MAC, general) ?       MAC

## 2021-10-07 NOTE — Telephone Encounter (Signed)
Left message for pt to call the office to schedule an appt for pre op clearance. Pt can see Dr. Debara Pickett or any APP.

## 2021-10-08 ENCOUNTER — Ambulatory Visit: Payer: Commercial Managed Care - HMO | Admitting: Physical Therapy

## 2021-10-09 NOTE — Telephone Encounter (Signed)
I s/w the pt and she has been scheduled to see Coletta Memos, FNP 10/31/21. Pt states she has to reschedule her procedure as she discovered she will not be available, so she will call [facility] and reschedule her procedure to be done sometime after she has been seen by cardiology 10/31/21. Pt is grateful for the call and the help today. I will forward notes to FNP for upcoming appt. Will send FYI to requesting office pt has appt with cardiology 10/31/21 and will be rescheduling her procedure, see notes.

## 2021-10-11 ENCOUNTER — Ambulatory Visit: Payer: Commercial Managed Care - HMO | Admitting: Physical Therapy

## 2021-10-11 ENCOUNTER — Encounter: Payer: Self-pay | Admitting: Physical Therapy

## 2021-10-11 ENCOUNTER — Other Ambulatory Visit: Payer: Self-pay

## 2021-10-11 DIAGNOSIS — M25552 Pain in left hip: Secondary | ICD-10-CM

## 2021-10-11 DIAGNOSIS — M6281 Muscle weakness (generalized): Secondary | ICD-10-CM | POA: Diagnosis not present

## 2021-10-11 DIAGNOSIS — M25551 Pain in right hip: Secondary | ICD-10-CM

## 2021-10-11 NOTE — Therapy (Signed)
[facility]. [place], [place], [address] Phone: [phone]   Fax:  [phone]  Physical Therapy Treatment  Patient Details  Name: Victoria Eaton MRN: [id number] Date of Birth: 06-29-1961 Referring Provider (PT): Leandrew Koyanagi, MD   Encounter Date: 10/11/2021   PT End of Session - 10/11/21 1052     Visit Number 8    Date for PT Re-Evaluation 10/15/21    PT Start Time 8315    PT Stop Time 1058    PT Time Calculation (min) 43 min    Activity Tolerance Patient tolerated treatment well    Behavior During Therapy [facility] for tasks assessed/performed             Past Medical History:  Diagnosis Date   Anxiety    Arthritis    "maybe in my left foot" (10/14/2017)   Bladder prolapse, female, acquired 09/14/2017   Coronary artery disease    Depression    High cholesterol    History of stomach ulcers    Hypertension    Myocardial infarction ([place]) 12/30/2004   Tobacco abuse    Type II diabetes mellitus ([place])     Past Surgical History:  Procedure Laterality Date   ANTERIOR AND POSTERIOR REPAIR N/A 07/09/2021   Procedure: ANTERIOR (CYSTOCELE) AND POSTERIOR REPAIR (RECTOCELE) with perineorrhaphy;  Surgeon: [name], MD;  [facility];  Service: Gynecology;  Laterality: N/A;   CARDIAC CATHETERIZATION  10/14/2017   CARPAL TUNNEL RELEASE Right 04/10/2021   Procedure: RIGHT CARPAL TUNNEL RELEASE;  Surgeon: Leandrew Koyanagi, MD;  Location: [place];  Service: Orthopedics;  Laterality: Right;   CORONARY ANGIOPLASTY WITH STENT PLACEMENT  12/30/2004   Patient reported   CORONARY ARTERY BYPASS GRAFT N/A 10/19/2017   Procedure: CORONARY ARTERY BYPASS GRAFTING (CABG) times four using the right saphaneous vein. Harvested endoscopicly and left internal mammary artery.;  Surgeon: Grace Isaac, MD;  Location: [place];  Service: Open Heart Surgery;  Laterality: N/A;   CYSTOSCOPY N/A 07/09/2021   Procedure: CYSTOSCOPY;   Surgeon: [name], MD;  [facility];  Service: Gynecology;  Laterality: N/A;   DILATION AND CURETTAGE OF UTERUS  1980s   LEFT HEART CATH AND CORONARY ANGIOGRAPHY N/A 10/14/2017   Procedure: LEFT HEART CATH AND CORONARY ANGIOGRAPHY;  Surgeon: Martinique, Peter M, MD;  Location: [place] [facility];  Service: Cardiovascular;  Laterality: N/A;   LEFT HEART CATH AND CORS/GRAFTS ANGIOGRAPHY N/A 01/01/2018   Procedure: LEFT HEART CATH AND CORS/GRAFTS ANGIOGRAPHY;  Surgeon: Jettie Booze, MD;  Location: [place] [facility];  Service: Cardiovascular;  Laterality: N/A;   LEFT HEART CATH AND CORS/GRAFTS ANGIOGRAPHY N/A 08/16/2018   Procedure: LEFT HEART CATH AND CORS/GRAFTS ANGIOGRAPHY;  Surgeon: Nelva Bush, MD;  Location: [place] [facility];  Service: Cardiovascular;  Laterality: N/A;   PILONIDAL CYST EXCISION  1980s   TEE WITHOUT CARDIOVERSION N/A 10/19/2017   Procedure: TRANSESOPHAGEAL ECHOCARDIOGRAM (TEE);  Surgeon: Grace Isaac, MD;  Location: [place];  Service: Open Heart Surgery;  Laterality: N/A;   TRIGGER FINGER RELEASE Right 04/10/2021   Procedure: RIGHT LONG FINGER TRIGGER RELEASE;  Surgeon: Leandrew Koyanagi, MD;  Location: [place];  Service: Orthopedics;  Laterality: Right;   VAGINAL HYSTERECTOMY N/A 07/09/2021   Procedure: HYSTERECTOMY VAGINAL;  Surgeon: [name], MD;  [facility];  Service: Gynecology;  Laterality: N/A;  total time requested for all procedures is 3 hours   VAGINAL PROLAPSE  REPAIR N/A 07/09/2021   Procedure: VAGINAL VAULT SUSPENSION;  Surgeon: [name], MD;  [facility];  Service: Gynecology;  Laterality: N/A;    There were no vitals filed for this visit.   Subjective Assessment - 10/11/21 1018     Subjective Pt had a fall last week crossing the rabbit pin. Did not seek medical attention    Currently in Pain? Yes    Pain Score 2     Pain Location Arm                [facility] PT Assessment - 10/11/21 0001        Strength   Right Hip Flexion 4/5    Right Hip Extension 4/5    Right Hip ABduction 4+/5    Right Hip ADduction 4/5    Left Hip Flexion 4/5    Left Hip Extension 4-/5    Left Hip ABduction 4+/5    Left Hip ADduction 4/5                           [facility] Adult PT Treatment/Exercise - 10/11/21 0001       Knee/Hip Exercises: Aerobic   Nustep L5 x 8mn      Knee/Hip Exercises: Machines for Strengthening   Cybex Leg Press 40lb 2x15    Other Machine Rows & lats 25lb 2x10      Knee/Hip Exercises: Standing   Lateral Step Up Both;2 sets;10 reps;Hand Hold: 0;Step Height: 6";Step Height: 8"    Other Standing Knee Exercises Hip abd & Ext x10 each      Knee/Hip Exercises: Seated   Sit to Sand 2 sets;10 reps;without UE support   OHP blue ball                      PT Short Term Goals - 10/11/21 1053       PT SHORT TERM GOAL #1   Title I with HEP    Status Achieved               PT Long Term Goals - 10/11/21 1027       PT LONG TERM GOAL #2   Title report pain < 3/10 with activity for improved function    Status Partially Met      PT LONG TERM GOAL #3   Title FOTO score improved to 49 for improved function    Status On-going      PT LONG TERM GOAL #4   Title demonstrate 4/5 bil hip strength for improved function    Status Partially Met                   Plan - 10/11/21 1053     Clinical Impression Statement Pt returns to therapy after a recent fall. Some brushing noted on her L forearm. She has progressed improving her hip strength. She reports hip pain in th morning that's seems to diminish throughout the day. Some instability noted today maintaining balance once she had stepped up to box. Cue for full ROM needed on leg press.    Personal Factors and Comorbidities Comorbidity 3+    Comorbidities chronic LBP, CABG,MI,DM, hysterectomy Oct 2022    Examination-Activity Limitations Locomotion  Level;Transfers;Sit;Squat;Stairs;Stand;Lift    Examination-Participation Restrictions Cleaning;Meal Prep;Community Activity;Occupation;Driving;Shop;Yard Work    Stability/Clinical Decision Making Stable/Uncomplicated    Rehab Potential Good    PT Frequency 2x / week    PT  Treatment/Interventions ADLs/Self Care Home Management;Cryotherapy;Electrical Stimulation;Traction;Moist Heat;Ultrasound;Gait training;Stair training;Functional mobility training;Therapeutic activities;Therapeutic exercise;Balance training;Neuromuscular re-education;Patient/family education;Manual techniques;Passive range of motion;Dry needling;Taping    PT Next Visit Plan progress hip/core strength, consider modalities/DN             Patient will benefit from skilled therapeutic intervention in order to improve the following deficits and impairments:  Abnormal gait, Increased fascial restricitons, Pain, Increased muscle spasms, Decreased strength, Impaired flexibility  Visit Diagnosis: Pain in right hip  Pain in left hip  Muscle weakness (generalized)     Problem List Patient Active Problem List   Diagnosis Date Noted   Chronic diarrhea 09/06/2021   Carpal tunnel syndrome on right    Trigger finger, right middle finger    Complete uterine prolapse 03/09/2021   Non-ST elevation (NSTEMI) myocardial infarction ([place]) 02/17/2018   Type 2 diabetes mellitus with complication, with long-term current use of insulin ([place]) 02/17/2018   Chest pain 01/01/2018   Unstable angina ([facility]) 01/01/2018   Arm contusion, left, initial encounter 11/25/2017   Contusion of right knee 11/25/2017   Hx of CABG 10/19/2017   Epigastric pain    Pancreatitis 10/10/2017   Hyperlipidemia LDL goal <70 09/14/2017   Essential hypertension 05/01/2017   Major depression, recurrent ([place]) 05/01/2017   Tobacco abuse 05/01/2017   History of MI (myocardial infarction) 05/01/2017   Allergic rhinitis 05/01/2017   History of arthritis 05/01/2017    Anxiety disorder, unspecified 05/01/2017   Bladder prolapse, female, acquired 11/09/2013    Scot Jun, PTA 10/11/2021, 10:55 AM  [facility]. [place], [place], [address] Phone: [phone]   Fax:  [phone]  Name: Victoria Eaton MRN: [id number] Date of Birth: [birth year]

## 2021-10-15 ENCOUNTER — Other Ambulatory Visit: Payer: Self-pay

## 2021-10-15 ENCOUNTER — Encounter: Payer: Self-pay | Admitting: Physical Therapy

## 2021-10-15 ENCOUNTER — Ambulatory Visit: Payer: Commercial Managed Care - HMO | Admitting: Physical Therapy

## 2021-10-15 NOTE — Therapy (Signed)
[facility]. [place], [place], [address] Phone: [phone]   Fax:  [phone]  Physical Therapy Treatment  Patient Details  Name: Victoria Eaton MRN: [id number] Date of Birth: 04/22/1961 Referring Provider (PT): Leandrew Koyanagi, MD   Encounter Date: 10/15/2021   PT End of Session - 10/15/21 1028     Visit Number 9    Date for PT Re-Evaluation 10/15/21    PT Start Time 1025    PT Stop Time 1030    PT Time Calculation (min) 5 min             Past Medical History:  Diagnosis Date   Anxiety    Arthritis    "maybe in my left foot" (10/14/2017)   Bladder prolapse, female, acquired 09/14/2017   Coronary artery disease    Depression    High cholesterol    History of stomach ulcers    Hypertension    Myocardial infarction ([place]) 12/30/2004   Tobacco abuse    Type II diabetes mellitus ([place])     Past Surgical History:  Procedure Laterality Date   ANTERIOR AND POSTERIOR REPAIR N/A 07/09/2021   Procedure: ANTERIOR (CYSTOCELE) AND POSTERIOR REPAIR (RECTOCELE) with perineorrhaphy;  Surgeon: [name], MD;  [facility];  Service: Gynecology;  Laterality: N/A;   CARDIAC CATHETERIZATION  10/14/2017   CARPAL TUNNEL RELEASE Right 04/10/2021   Procedure: RIGHT CARPAL TUNNEL RELEASE;  Surgeon: Leandrew Koyanagi, MD;  Location: [place];  Service: Orthopedics;  Laterality: Right;   CORONARY ANGIOPLASTY WITH STENT PLACEMENT  12/30/2004   Patient reported   CORONARY ARTERY BYPASS GRAFT N/A 10/19/2017   Procedure: CORONARY ARTERY BYPASS GRAFTING (CABG) times four using the right saphaneous vein. Harvested endoscopicly and left internal mammary artery.;  Surgeon: Grace Isaac, MD;  Location: [place];  Service: Open Heart Surgery;  Laterality: N/A;   CYSTOSCOPY N/A 07/09/2021   Procedure: CYSTOSCOPY;  Surgeon: [name], MD;  [facility];  Service: Gynecology;  Laterality: N/A;   DILATION AND  CURETTAGE OF UTERUS  1980s   LEFT HEART CATH AND CORONARY ANGIOGRAPHY N/A 10/14/2017   Procedure: LEFT HEART CATH AND CORONARY ANGIOGRAPHY;  Surgeon: Martinique, Peter M, MD;  Location: [facility];  Service: Cardiovascular;  Laterality: N/A;   LEFT HEART CATH AND CORS/GRAFTS ANGIOGRAPHY N/A 01/01/2018   Procedure: LEFT HEART CATH AND CORS/GRAFTS ANGIOGRAPHY;  Surgeon: Jettie Booze, MD;  Location: [place] [facility] [facility];  Service: Cardiovascular;  Laterality: N/A;   LEFT HEART CATH AND CORS/GRAFTS ANGIOGRAPHY N/A 08/16/2018   Procedure: LEFT HEART CATH AND CORS/GRAFTS ANGIOGRAPHY;  Surgeon: Nelva Bush, MD;  Location: [facility];  Service: Cardiovascular;  Laterality: N/A;   PILONIDAL CYST EXCISION  1980s   TEE WITHOUT CARDIOVERSION N/A 10/19/2017   Procedure: TRANSESOPHAGEAL ECHOCARDIOGRAM (TEE);  Surgeon: Grace Isaac, MD;  Location: [facility];  Service: Open Heart Surgery;  Laterality: N/A;   TRIGGER FINGER RELEASE Right 04/10/2021   Procedure: RIGHT LONG FINGER TRIGGER RELEASE;  Surgeon: Leandrew Koyanagi, MD;  Location: [facility];  Service: Orthopedics;  Laterality: Right;   VAGINAL HYSTERECTOMY N/A 07/09/2021   Procedure: HYSTERECTOMY VAGINAL;  Surgeon: [name], MD;  [facility];  Service: Gynecology;  Laterality: N/A;  total time requested for all procedures is 3 hours   VAGINAL PROLAPSE REPAIR N/A 07/09/2021   Procedure: VAGINAL VAULT SUSPENSION;  Surgeon: [name], MD;  [facility];  Service: Gynecology;  Laterality: N/A;    There were no vitals filed for this visit.   Subjective Assessment - 10/15/21 1027     Subjective Pt reports that she does not feel good and is sick.                                          PT Short Term Goals - 10/11/21 1053       PT SHORT TERM GOAL #1   Title I with HEP    Status Achieved               PT Long Term Goals - 10/11/21 1027       PT LONG TERM GOAL #2    Title report pain < 3/10 with activity for improved function    Status Partially Met      PT LONG TERM GOAL #3   Title FOTO score improved to 49 for improved function    Status On-going      PT LONG TERM GOAL #4   Title demonstrate 4/5 bil hip strength for improved function    Status Partially Met                   Plan - 10/15/21 1028     Clinical Impression Statement Pt enters clinic reporting that she was sick. She reports having a headache and blurry vision not wanting to partake in therapy. Pt was coughing upon exiting clinic.    PT Next Visit Plan Check goals possible renewal             Patient will benefit from skilled therapeutic intervention in order to improve the following deficits and impairments:     Visit Diagnosis: Pain in right hip  Muscle weakness (generalized)  Pain in left hip     Problem List Patient Active Problem List   Diagnosis Date Noted   Chronic diarrhea 09/06/2021   Carpal tunnel syndrome on right    Trigger finger, right middle finger    Complete uterine prolapse 03/09/2021   Non-ST elevation (NSTEMI) myocardial infarction ([place]) 02/17/2018   Type 2 diabetes mellitus with complication, with long-term current use of insulin ([place]) 02/17/2018   Chest pain 01/01/2018   Unstable angina ([place]) 01/01/2018   Arm contusion, left, initial encounter 11/25/2017   Contusion of right knee 11/25/2017   Hx of CABG 10/19/2017   Epigastric pain    Pancreatitis 10/10/2017   Hyperlipidemia LDL goal <70 09/14/2017   Essential hypertension 05/01/2017   Major depression, recurrent ([place]) 05/01/2017   Tobacco abuse 05/01/2017   History of MI (myocardial infarction) 05/01/2017   Allergic rhinitis 05/01/2017   History of arthritis 05/01/2017   Anxiety disorder, unspecified 05/01/2017   Bladder prolapse, female, acquired 11/09/2013    Scot Jun, PTA 10/15/2021, 10:31 AM  [facility]. [place], [place], [address] Phone: [phone]   Fax:  [phone]  Name: Victoria Eaton MRN: [id number] Date of Birth: 1961/01/12

## 2021-10-17 NOTE — Telephone Encounter (Signed)
Patient has been scheduled for liver biopsy on 10-22-2021 @1pm . Pt aware to hold Eliquis 2 days prior.

## 2021-10-22 ENCOUNTER — Ambulatory Visit: Payer: Commercial Managed Care - HMO | Admitting: Physical Therapy

## 2021-10-23 ENCOUNTER — Other Ambulatory Visit: Payer: Self-pay | Admitting: Family Medicine

## 2021-10-23 DIAGNOSIS — E118 Type 2 diabetes mellitus with unspecified complications: Secondary | ICD-10-CM

## 2021-10-24 ENCOUNTER — Ambulatory Visit: Payer: Managed Care, Other (non HMO) | Admitting: Physical Therapy

## 2021-10-28 ENCOUNTER — Encounter: Payer: Managed Care, Other (non HMO) | Admitting: Gastroenterology

## 2021-10-29 NOTE — Progress Notes (Signed)
error 
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
## 2021-10-31 ENCOUNTER — Ambulatory Visit: Payer: Managed Care, Other (non HMO) | Admitting: Nurse Practitioner

## 2021-10-31 ENCOUNTER — Other Ambulatory Visit: Payer: Self-pay

## 2021-10-31 ENCOUNTER — Telehealth: Payer: Self-pay

## 2021-10-31 ENCOUNTER — Other Ambulatory Visit (INDEPENDENT_AMBULATORY_CARE_PROVIDER_SITE_OTHER): Payer: Managed Care, Other (non HMO)

## 2021-10-31 ENCOUNTER — Encounter: Payer: Self-pay | Admitting: General Practice

## 2021-10-31 VITALS — BP 122/66 | HR 63 | Ht 66.0 in | Wt 199.2 lb

## 2021-10-31 DIAGNOSIS — I2510 Atherosclerotic heart disease of native coronary artery without angina pectoris: Secondary | ICD-10-CM

## 2021-10-31 DIAGNOSIS — Z7901 Long term (current) use of anticoagulants: Secondary | ICD-10-CM

## 2021-10-31 DIAGNOSIS — I10 Essential (primary) hypertension: Secondary | ICD-10-CM

## 2021-10-31 DIAGNOSIS — K529 Noninfective gastroenteritis and colitis, unspecified: Secondary | ICD-10-CM

## 2021-10-31 DIAGNOSIS — Z79899 Other long term (current) drug therapy: Secondary | ICD-10-CM

## 2021-10-31 DIAGNOSIS — Z720 Tobacco use: Secondary | ICD-10-CM | POA: Diagnosis not present

## 2021-10-31 DIAGNOSIS — Z794 Long term (current) use of insulin: Secondary | ICD-10-CM

## 2021-10-31 DIAGNOSIS — E118 Type 2 diabetes mellitus with unspecified complications: Secondary | ICD-10-CM

## 2021-10-31 DIAGNOSIS — E785 Hyperlipidemia, unspecified: Secondary | ICD-10-CM

## 2021-10-31 LAB — HIGH SENSITIVITY CRP: CRP, High Sensitivity: 20.15 mg/L — ABNORMAL HIGH (ref 0.000–5.000)

## 2021-10-31 LAB — SEDIMENTATION RATE: Sed Rate: 39 mm/hr — ABNORMAL HIGH (ref 0–30)

## 2021-10-31 MED ORDER — EZETIMIBE 10 MG PO TABS: 10.0000 mg | ORAL_TABLET | Freq: Every day | ORAL | 6 refills | Status: DC

## 2021-10-31 NOTE — Patient Instructions (Addendum)
Medication Instructions:  START Ezetimibe 10MG DAILY  PEPCID AC FOR REFLUX AND AVOID OMEPRAZOLE  *If you need a refill on your cardiac medications before your next appointment, please call your pharmacy*  Lab Work:    BMET TODAY AND FASTING LIPID AND LFT IN 2-3 MONTHS     Follow-Up: Your next appointment:  12 month(s) In Person with Pixie Casino, MD   Please call our office 2 months in advance to schedule this appointment  At [facility], you and your health needs are our priority.  As part of our continuing mission to provide you with exceptional heart care, we have created designated Provider Care Teams.  These Care Teams include your primary Cardiologist (physician) and Advanced Practice Providers (APPs -  Physician Assistants and Nurse Practitioners) who all work together to provide you with the care you need, when you need it.

## 2021-10-31 NOTE — Telephone Encounter (Signed)
Received a message from the [place] lab. The Gastrointestinal panel is no longer being used. Correct order should be GI profile, PCR. This order has been changed and re-entered.

## 2021-11-01 LAB — BASIC METABOLIC PANEL
BUN/Creatinine Ratio: 17 (ref 12–28)
BUN: 18 mg/dL (ref 8–27)
CO2: 23 mmol/L (ref 20–29)
Calcium: 9.9 mg/dL (ref 8.7–10.3)
Chloride: 104 mmol/L (ref 96–106)
Creatinine, Ser: 1.04 mg/dL — ABNORMAL HIGH (ref 0.57–1.00)
Glucose: 97 mg/dL (ref 70–99)
Potassium: 4.6 mmol/L (ref 3.5–5.2)
Sodium: 143 mmol/L (ref 134–144)
eGFR: 61 mL/min/{1.73_m2} (ref >59)

## 2021-11-11 ENCOUNTER — Other Ambulatory Visit: Payer: Self-pay

## 2021-11-11 DIAGNOSIS — Z87891 Personal history of nicotine dependence: Secondary | ICD-10-CM

## 2021-11-11 DIAGNOSIS — F17210 Nicotine dependence, cigarettes, uncomplicated: Secondary | ICD-10-CM

## 2021-11-21 ENCOUNTER — Other Ambulatory Visit: Payer: Managed Care, Other (non HMO)

## 2021-11-21 ENCOUNTER — Encounter: Payer: Self-pay | Admitting: Gastroenterology

## 2021-11-21 DIAGNOSIS — K529 Noninfective gastroenteritis and colitis, unspecified: Secondary | ICD-10-CM

## 2021-11-21 DIAGNOSIS — Z7901 Long term (current) use of anticoagulants: Secondary | ICD-10-CM

## 2021-11-22 ENCOUNTER — Other Ambulatory Visit: Payer: Self-pay

## 2021-11-22 ENCOUNTER — Encounter: Payer: Self-pay | Admitting: Acute Care

## 2021-11-22 ENCOUNTER — Ambulatory Visit (INDEPENDENT_AMBULATORY_CARE_PROVIDER_SITE_OTHER): Payer: Managed Care, Other (non HMO) | Admitting: Acute Care

## 2021-11-22 ENCOUNTER — Ambulatory Visit (HOSPITAL_BASED_OUTPATIENT_CLINIC_OR_DEPARTMENT_OTHER): Payer: Managed Care, Other (non HMO)

## 2021-11-22 DIAGNOSIS — F17210 Nicotine dependence, cigarettes, uncomplicated: Secondary | ICD-10-CM | POA: Diagnosis not present

## 2021-11-22 NOTE — Patient Instructions (Signed)
Thank you for participating in the [facility] Screening Program. °It was our pleasure to meet you today. °We will call you with the results of your scan within the next few days. °Your scan will be assigned a Lung RADS category score by the physicians reading the scans.  °This Lung RADS score determines follow up scanning.  °See below for description of categories, and follow up screening recommendations. °We will be in touch to schedule your follow up screening annually or based on recommendations of our providers. °We will fax a copy of your scan results to your Primary Care Physician, or the physician who referred you to the program, to ensure they have the results. °Please call the office if you have any questions or concerns regarding your scanning experience or results.  °Our office number is [phone]. °Please speak with Denise Phelps, RN. She is our Lung Cancer Screening RN. °If she is unavailable when you call, please have the office staff send her a message. She will return your call at her earliest convenience. °Remember, if your scan is normal, we will scan you annually as long as you continue to meet the criteria for the program. (Age 55-77, Current smoker or smoker who has quit within the last 15 years). °If you are a smoker, remember, quitting is the single most powerful action that you can take to decrease your risk of lung cancer and other pulmonary, breathing related problems. °We know quitting is hard, and we are here to help.  °Please let us know if there is anything we can do to help you meet your goal of quitting. °If you are a former smoker, congratulations. We are proud of you! Remain smoke free! °Remember you can refer friends or family members through the number above.  °We will screen them to make sure they meet criteria for the program. °Thank you for helping us take better care of you by participating in Lung Screening. ° °You can receive free nicotine replacement therapy  ( patches, gum or mints) by calling 1-800-QUIT NOW. Please call so we can get you on the path to becoming  a non-smoker. I know it is hard, but you can do this! ° °Lung RADS Categories: ° °Lung RADS 1: no nodules or definitely non-concerning nodules.  °Recommendation is for a repeat annual scan in 12 months. ° °Lung RADS 2:  nodules that are non-concerning in appearance and behavior with a very low likelihood of becoming an active cancer. °Recommendation is for a repeat annual scan in 12 months. ° °Lung RADS 3: nodules that are probably non-concerning , includes nodules with a low likelihood of becoming an active cancer.  Recommendation is for a 6-month repeat screening scan. Often noted after an upper respiratory illness. We will be in touch to make sure you have no questions, and to schedule your 6-month scan. ° °Lung RADS 4 A: nodules with concerning findings, recommendation is most often for a follow up scan in 3 months or additional testing based on our provider's assessment of the scan. We will be in touch to make sure you have no questions and to schedule the recommended 3 month follow up scan. ° °Lung RADS 4 B:  indicates findings that are concerning. We will be in touch with you to schedule additional diagnostic testing based on our provider's  assessment of the scan. ° °Hypnosis for smoking cessation  °[employer] °[phone] ° °Acupuncture for smoking cessation  °[facility] °[phone]  °

## 2021-11-22 NOTE — Progress Notes (Signed)
Virtual Visit via Telephone Note ? ?I connected with Victoria Eaton on 08/06/21 at  2:00 PM EST by telephone and verified that I am speaking with the correct person using two identifiers. ? ?Location: ?Patient: Home ?Provider: Working from home ?  ?I discussed the limitations, risks, security and privacy concerns of performing an evaluation and management service by telephone and the availability of in person appointments. I also discussed with the patient that there may be a patient responsible charge related to this service. The patient expressed understanding and agreed to proceed. ? ?Shared Decision Making Visit Lung Cancer Screening Program ?([phone]) ? ? ?Eligibility: ?Age 61 y.o. ?Pack Years Smoking History Calculation 33 ?(# packs/per year x # years smoked) ?Recent History of coughing up blood  no ?Unexplained weight loss? no ?( >Than 15 pounds within the last 6 months ) ?Prior History Lung / other cancer no ?(Diagnosis within the last 5 years already requiring surveillance chest CT Scans). ?Smoking Status Current Smoker ?Former Smokers: Years since quit: NA ? Quit Date: NA ? ?Visit Components: ?Discussion included one or more decision making aids. yes ?Discussion included risk/benefits of screening. yes ?Discussion included potential follow up diagnostic testing for abnormal scans. yes ?Discussion included meaning and risk of over diagnosis. yes ?Discussion included meaning and risk of False Positives. yes ?Discussion included meaning of total radiation exposure. yes ? ?Counseling Included: ?Importance of adherence to annual lung cancer LDCT screening. yes ?Impact of comorbidities on ability to participate in the program. yes ?Ability and willingness to under diagnostic treatment. yes ? ?Smoking Cessation Counseling: ?Current Smokers:  ?Discussed importance of smoking cessation. yes ?Information about tobacco cessation classes and interventions provided to patient. yes ?Patient provided with "ticket" for LDCT  Scan. yes ?Symptomatic Patient. yes ? Counseling(Intermediate counseling: > three minutes) 99406 ?Diagnosis Code: Tobacco Use Z72.0 ?Asymptomatic Patient no ? Counseling  NA ?Former Smokers:  ?Discussed the importance of maintaining cigarette abstinence. yes ?Diagnosis Code: Personal History of Nicotine Dependence. U20.254 ?Information about tobacco cessation classes and interventions provided to patient. Yes ?Patient provided with "ticket" for LDCT Scan. yes ?Written Order for Lung Cancer Screening with LDCT placed in Epic. Yes ?(CT Chest Lung Cancer Screening Low Dose W/O CM) [id number] ?Z12.2-Screening of respiratory organs ?Z87.891-Personal history of nicotine dependence ? ? ?I spent 25 minutes of face to face time with her discussing the risks and benefits of lung cancer screening. We viewed a power point together that explained in detail the above noted topics. We took the time to pause the power point at intervals to allow for questions to be asked and answered to ensure understanding. We discussed that she had taken the single most powerful action possible to decrease her risk of developing lung cancer when he quit smoking. I counseled her to remain smoke free, and to contact me if he ever had the desire to smoke again so that I can provide resources and tools to help support the effort to remain smoke free. We discussed the time and location of the scan, and that either  [name] RN or I will call with the results within  24-48 hours of receiving them. She has my card and contact information in the event she needs to speak with me, in addition to a copy of the power point we reviewed as a resource. he verbalized understanding of all of the above and had no further questions upon leaving the office.  ? ? ? ?I explained to the patient that there has been a  high incidence of coronary artery disease noted on these exams. I explained that this is a non-gated exam therefore degree or severity cannot be  determined. This patient is on statin therapy. I have asked the patient to follow-up with their PCP regarding any incidental finding of coronary artery disease and management with diet or medication as they feel is clinically indicated. The patient verbalized understanding of the above and had no further questions. ? ? ?I spent 3 minutes counseling on smoking cessation and the health risks of continued tobacco abuse  ? ? ?Victoria Liz D. Harris, NP-C ?[facility] ?Personal contact information can be found on Amion  ?11/22/2021, 10:48 AM ? ? ? ? ? ? ? ? ? ?

## 2021-11-24 LAB — GI PROFILE, STOOL, PCR

## 2021-11-25 ENCOUNTER — Telehealth: Payer: Self-pay | Admitting: Gastroenterology

## 2021-11-25 NOTE — Telephone Encounter (Signed)
Pt returned my call.  Instructed her to take her plavix today.  ?

## 2021-11-25 NOTE — Telephone Encounter (Signed)
No, I am perfectly fine with her just continuing the Plavix straight through. Holding now wont wash out by tomorrow, and generally speaking, safe to perform on Plavix or ASA outside of large polypectomy, etc. No need to hold. Thank you! ?

## 2021-11-25 NOTE — Telephone Encounter (Signed)
Patient called this morning.  She has a procedure with Dr. Bryan Lemma tomorrow morning, but she has done several things on the list she should not have.  She needs to know if she should proceed with the prep or if she should reschedule.  Please call and advise.  Thank you. ?

## 2021-11-25 NOTE — Telephone Encounter (Signed)
Phoned pt.  She would like to proceed with procedure tomorrow.  She  will plan to add dulcolax to her prep.  She will also hold Plavix and iron until after procedure.   ?

## 2021-11-25 NOTE — Telephone Encounter (Signed)
LM on VM for patient to call back.  ?

## 2021-11-25 NOTE — Telephone Encounter (Signed)
LM on pts VM to take her Plavix today so that there is no interruption in her therapy.  Instructed her to call before 5 or call the on call physician if she has questions.   ?

## 2021-11-25 NOTE — Telephone Encounter (Signed)
I am not too worried about the Plavix. I continue that through colonoscopy routinely.  ? ?The iron could decrease the efficacy of the bowel prep and increase chance of a suboptimal prep. If she would still like to proceed as scheduled tomorrow, I recommend adding Dulcolax 10 mg now, then at 1800 and again at 0200 tomorrow, along with clears today and prep as scheduled.  ?

## 2021-11-25 NOTE — Telephone Encounter (Signed)
Returned pts call.  She just read her instructions this morning and has not followed them so wants to know if she can proceed with procedure tomorrow.  She was supposed to have held her plavix for 5 days and took it yesterday.  She has not taken it today.  She is also taking iron and did not hold that.  Dr. Bryan Lemma please advise.   ?

## 2021-11-25 NOTE — Telephone Encounter (Signed)
We were reviewing this chart for tomorrow and noticed that there is not a note from this patient's cardiologist to hold her plavix. The office note says that our office was going to contact her cardiologist but I do not see it.  Is it still ok for her to hold it until after her procedure tomorrow?  Do we need medical clearance for her to have her procedure?  ?

## 2021-11-26 ENCOUNTER — Other Ambulatory Visit: Payer: Managed Care, Other (non HMO)

## 2021-11-26 ENCOUNTER — Encounter: Payer: Self-pay | Admitting: Gastroenterology

## 2021-11-26 ENCOUNTER — Other Ambulatory Visit: Payer: Self-pay

## 2021-11-26 ENCOUNTER — Ambulatory Visit (AMBULATORY_SURGERY_CENTER): Payer: Managed Care, Other (non HMO) | Admitting: Gastroenterology

## 2021-11-26 VITALS — BP 147/44 | HR 69 | Temp 98.6°F | Resp 21 | Ht 66.0 in | Wt 194.0 lb

## 2021-11-26 DIAGNOSIS — K644 Residual hemorrhoidal skin tags: Secondary | ICD-10-CM

## 2021-11-26 DIAGNOSIS — K529 Noninfective gastroenteritis and colitis, unspecified: Secondary | ICD-10-CM | POA: Diagnosis not present

## 2021-11-26 DIAGNOSIS — K635 Polyp of colon: Secondary | ICD-10-CM

## 2021-11-26 DIAGNOSIS — D123 Benign neoplasm of transverse colon: Secondary | ICD-10-CM

## 2021-11-26 DIAGNOSIS — K5140 Inflammatory polyps of colon without complications: Secondary | ICD-10-CM

## 2021-11-26 DIAGNOSIS — D121 Benign neoplasm of appendix: Secondary | ICD-10-CM

## 2021-11-26 DIAGNOSIS — K633 Ulcer of intestine: Secondary | ICD-10-CM | POA: Diagnosis not present

## 2021-11-26 LAB — CALPROTECTIN, FECAL: Calprotectin, Fecal: 261 ug/g — ABNORMAL HIGH (ref 0–120)

## 2021-11-26 MED ORDER — SODIUM CHLORIDE 0.9 % IV SOLN: 500.0000 mL | Freq: Once | INTRAVENOUS | Status: DC

## 2021-11-26 MED ORDER — MESALAMINE 1.2 G PO TBEC: DELAYED_RELEASE_TABLET | ORAL | 3 refills | Status: DC

## 2021-11-26 NOTE — Telephone Encounter (Signed)
Not at all. And nothing on covermymeds for this patient too. It was given to her today after her procedure. Should we ask Dr C about this? ?

## 2021-11-26 NOTE — Progress Notes (Signed)
GASTROENTEROLOGY PROCEDURE H&P NOTE   Primary Care Physician: Martinique, Betty G, MD    Reason for Procedure:   Chronic diarrhea, elevated inflamamtory markers  Plan:    Colonoscopy  Patient is appropriate for endoscopic procedure(s) in the ambulatory ([facility]) setting.  The nature of the procedure, as well as the risks, benefits, and alternatives were carefully and thoroughly reviewed with the patient. Ample time for discussion and questions allowed. The patient understood, was satisfied, and agreed to proceed.     HPI: Victoria Eaton is a 61 y.o. female who presents for Colonsocopy for eval fo diarrhea  Past Medical History:  Diagnosis Date   Anxiety    Arthritis    "maybe in my left foot" (10/14/2017)   Bladder prolapse, female, acquired 09/14/2017   Coronary artery disease    Depression    High cholesterol    History of stomach ulcers    Hypertension    Myocardial infarction ([place]) 12/30/2004   Tobacco abuse    Type II diabetes mellitus ([place])     Past Surgical History:  Procedure Laterality Date   ANTERIOR AND POSTERIOR REPAIR N/A 07/09/2021   Procedure: ANTERIOR (CYSTOCELE) AND POSTERIOR REPAIR (RECTOCELE) with perineorrhaphy;  Surgeon: [name], MD;  Location: [facility];  Service: Gynecology;  Laterality: N/A;   CARDIAC CATHETERIZATION  10/14/2017   CARPAL TUNNEL RELEASE Right 04/10/2021   Procedure: RIGHT CARPAL TUNNEL RELEASE;  Surgeon: Leandrew Koyanagi, MD;  Location: [facility];  Service: Orthopedics;  Laterality: Right;   CORONARY ANGIOPLASTY WITH STENT PLACEMENT  12/30/2004   Patient reported   CORONARY ARTERY BYPASS GRAFT N/A 10/19/2017   Procedure: CORONARY ARTERY BYPASS GRAFTING (CABG) times four using the right saphaneous vein. Harvested endoscopicly and left internal mammary artery.;  Surgeon: Grace Isaac, MD;  Location: [facility];  Service: Open Heart Surgery;  Laterality: N/A;   CYSTOSCOPY N/A 07/09/2021   Procedure: CYSTOSCOPY;  Surgeon: [name], MD;  Location: [facility];  Service: Gynecology;  Laterality: N/A;   DILATION AND CURETTAGE OF UTERUS  1980s   LEFT HEART CATH AND CORONARY ANGIOGRAPHY N/A 10/14/2017   Procedure: LEFT HEART CATH AND CORONARY ANGIOGRAPHY;  Surgeon: Martinique, Peter M, MD;  Location: [facility];  Service: Cardiovascular;  Laterality: N/A;   LEFT HEART CATH AND CORS/GRAFTS ANGIOGRAPHY N/A 01/01/2018   Procedure: LEFT HEART CATH AND CORS/GRAFTS ANGIOGRAPHY;  Surgeon: Jettie Booze, MD;  Location: [facility];  Service: Cardiovascular;  Laterality: N/A;   LEFT HEART CATH AND CORS/GRAFTS ANGIOGRAPHY N/A 08/16/2018   Procedure: LEFT HEART CATH AND CORS/GRAFTS ANGIOGRAPHY;  Surgeon: Nelva Bush, MD;  Location: [facility];  Service: Cardiovascular;  Laterality: N/A;   PILONIDAL CYST EXCISION  1980s   TEE WITHOUT CARDIOVERSION N/A 10/19/2017   Procedure: TRANSESOPHAGEAL ECHOCARDIOGRAM (TEE);  Surgeon: Grace Isaac, MD;  Location: [facility];  Service: Open Heart Surgery;  Laterality: N/A;   TRIGGER FINGER RELEASE Right 04/10/2021   Procedure: RIGHT LONG FINGER TRIGGER RELEASE;  Surgeon: Leandrew Koyanagi, MD;  Location: [facility];  Service: Orthopedics;  Laterality: Right;   VAGINAL HYSTERECTOMY N/A 07/09/2021   Procedure: HYSTERECTOMY VAGINAL;  Surgeon: [name], MD;  Location: [facility];  Service: Gynecology;  Laterality: N/A;  total time requested for all procedures is 3 hours   VAGINAL PROLAPSE REPAIR N/A 07/09/2021   Procedure: VAGINAL VAULT SUSPENSION;  Surgeon: [name], MD;  Location: [facility];  Service: Gynecology;  Laterality: N/A;  Prior to Admission medications   Medication Sig Start Date End Date Taking? Authorizing Provider  acetaminophen (TYLENOL) 500 MG tablet Take 1 tablet (500 mg total) by mouth every 6 (six) hours as needed (pain). 06/28/21  Yes [name], MD  amLODipine (NORVASC) 5 MG tablet TAKE ONE TABLET BY MOUTH ONE TIME DAILY 06/26/21   Yes Hilty, Nadean Corwin, MD  buPROPion (WELLBUTRIN XL) 300 MG 24 hr tablet TAKE ONE TABLET BY MOUTH ONE TIME DAILY 07/12/21  Yes Martinique, Betty G, MD  clopidogrel (PLAVIX) 75 MG tablet Take 1 tablet (75 mg total) by mouth daily. 11/07/20  Yes Just, Laurita Quint, FNP  Continuous Blood Gluc Sensor (FREESTYLE LIBRE 14 DAY SENSOR) MISC APPLY ONE SENSOR TO THE BACK OF YOUR UPPER ARM. REPLACE EVERY 14 DAYS. 09/11/21  Yes Martinique, Betty G, MD  dapagliflozin propanediol (FARXIGA) 5 MG TABS tablet Take 1 tablet (5 mg total) by mouth daily before breakfast. 12/06/20 12/01/21 Yes Just, Laurita Quint, FNP  ezetimibe (ZETIA) 10 MG tablet Take 1 tablet (10 mg total) by mouth daily. 10/31/21 01/29/22 Yes Swinyer, Lanice Schwab, NP  insulin aspart (NOVOLOG FLEXPEN) 100 UNIT/ML FlexPen Inject 5 Units into the skin 3 (three) times daily with meals as needed for high blood sugar.   Yes [provider]  Insulin Pen Needle (ULTICARE MICRO PEN NEEDLES) 32G X 4 MM MISC USE WITH INJECTIONS FOUR TIMES A DAY 10/23/21  Yes Martinique, Betty G, MD  isosorbide mononitrate (IMDUR) 120 MG 24 hr tablet Take 1 tablet (120 mg total) by mouth daily. 04/19/21  Yes Hilty, Nadean Corwin, MD  lisinopril (ZESTRIL) 5 MG tablet TAKE ONE TABLET BY MOUTH ONE TIME DAILY 08/19/21  Yes Martinique, Betty G, MD  metoprolol tartrate (LOPRESSOR) 50 MG tablet TAKE ONE TABLET BY MOUTH TWICE A DAY 05/27/21  Yes Hilty, Nadean Corwin, MD  rosuvastatin (CRESTOR) 40 MG tablet Take 1 tablet (40 mg total) by mouth daily. 11/07/20  Yes Just, Laurita Quint, FNP  sertraline (ZOLOFT) 100 MG tablet TAKE TWO TABLETS BY MOUTH ONE TIME DAILY 07/12/21  Yes Martinique, Betty G, MD  TOUJEO MAX SOLOSTAR 300 UNIT/ML Solostar Pen INJECT Tye SKIN DAILY 09/24/21  Yes Martinique, Betty G, MD  aspirin 81 MG EC tablet Take 81 mg by mouth daily.    [provider]  Dulaglutide (TRULICITY) 1.5 LA/4.5XM SOPN Inject 1.5 mg into the skin once a week. Patient taking differently: Inject 1.5 mg into the skin  every Tuesday. 11/07/20   Just, Laurita Quint, FNP  Iron, Ferrous Sulfate, 325 (65 Fe) MG TABS Take 325 mg by mouth daily. 06/09/21   Martinique, Betty G, MD  loratadine (CLARITIN) 10 MG tablet Take 10 mg by mouth daily.    [provider]  methocarbamol (ROBAXIN) 500 MG tablet Take 1 tablet (500 mg total) by mouth 2 (two) times daily as needed. 11/07/20   Just, Laurita Quint, FNP  Multiple Vitamin (MULTIVITAMIN WITH MINERALS) TABS tablet Take 1 tablet by mouth daily.    [provider]  nitroGLYCERIN (NITROSTAT) 0.4 MG SL tablet Place 0.4 mg under the tongue every 5 (five) minutes as needed for chest pain.    [provider]  Omega-3 Fatty Acids (FISH OIL) 1200 MG CAPS Take 1,200 mg by mouth daily.    [provider]  oxyCODONE (OXY IR/ROXICODONE) 5 MG immediate release tablet Take 1 tablet (5 mg total) by mouth every 4 (four) hours as needed for severe pain. 06/28/21  [name], MD  polyethylene glycol powder [facility] 17 GM/SCOOP powder Take 17 g by mouth daily. Drink 17g (1 scoop) dissolved in water per day. Patient not taking: Reported on 11/26/2021 06/28/21   [name], MD  Turmeric 500 MG CAPS Take 1,000 mg by mouth daily.    [provider]    Current Outpatient Medications  Medication Sig Dispense Refill   acetaminophen (TYLENOL) 500 MG tablet Take 1 tablet (500 mg total) by mouth every 6 (six) hours as needed (pain). 30 tablet 0   amLODipine (NORVASC) 5 MG tablet TAKE ONE TABLET BY MOUTH ONE TIME DAILY 90 tablet 3   buPROPion (WELLBUTRIN XL) 300 MG 24 hr tablet TAKE ONE TABLET BY MOUTH ONE TIME DAILY 90 tablet 2   clopidogrel (PLAVIX) 75 MG tablet Take 1 tablet (75 mg total) by mouth daily. 90 tablet 2   Continuous Blood Gluc Sensor (FREESTYLE LIBRE 14 DAY SENSOR) MISC APPLY ONE SENSOR TO THE BACK OF YOUR UPPER ARM. REPLACE EVERY 14 DAYS. 1 each 5   dapagliflozin propanediol (FARXIGA) 5 MG TABS tablet Take 1 tablet (5 mg total)  by mouth daily before breakfast. 90 tablet 3   ezetimibe (ZETIA) 10 MG tablet Take 1 tablet (10 mg total) by mouth daily. 30 tablet 6   insulin aspart (NOVOLOG FLEXPEN) 100 UNIT/ML FlexPen Inject 5 Units into the skin 3 (three) times daily with meals as needed for high blood sugar.     Insulin Pen Needle (ULTICARE MICRO PEN NEEDLES) 32G X 4 MM MISC USE WITH INJECTIONS FOUR TIMES A DAY 400 each 3   isosorbide mononitrate (IMDUR) 120 MG 24 hr tablet Take 1 tablet (120 mg total) by mouth daily. 90 tablet 3   lisinopril (ZESTRIL) 5 MG tablet TAKE ONE TABLET BY MOUTH ONE TIME DAILY 90 tablet 1   metoprolol tartrate (LOPRESSOR) 50 MG tablet TAKE ONE TABLET BY MOUTH TWICE A DAY 180 tablet 2   rosuvastatin (CRESTOR) 40 MG tablet Take 1 tablet (40 mg total) by mouth daily. 90 tablet 3   sertraline (ZOLOFT) 100 MG tablet TAKE TWO TABLETS BY MOUTH ONE TIME DAILY 180 tablet 2   TOUJEO MAX SOLOSTAR 300 UNIT/ML Solostar Pen INJECT 48 UNITS INTO THE SKIN DAILY 30 mL 2   aspirin 81 MG EC tablet Take 81 mg by mouth daily.     Dulaglutide (TRULICITY) 1.5 KA/7.6OT SOPN Inject 1.5 mg into the skin once a week. (Patient taking differently: Inject 1.5 mg into the skin every Tuesday.) 0.5 mL 6   Iron, Ferrous Sulfate, 325 (65 Fe) MG TABS Take 325 mg by mouth daily. 90 tablet 2   loratadine (CLARITIN) 10 MG tablet Take 10 mg by mouth daily.     methocarbamol (ROBAXIN) 500 MG tablet Take 1 tablet (500 mg total) by mouth 2 (two) times daily as needed. 20 tablet 0   Multiple Vitamin (MULTIVITAMIN WITH MINERALS) TABS tablet Take 1 tablet by mouth daily.     nitroGLYCERIN (NITROSTAT) 0.4 MG SL tablet Place 0.4 mg under the tongue every 5 (five) minutes as needed for chest pain.     Omega-3 Fatty Acids (FISH OIL) 1200 MG CAPS Take 1,200 mg by mouth daily.     oxyCODONE (OXY IR/ROXICODONE) 5 MG immediate release tablet Take 1 tablet (5 mg total) by mouth every 4 (four) hours as needed for severe pain. 15 tablet 0   polyethylene  glycol powder (GLYCOLAX/MIRALAX) 17 GM/SCOOP powder Take 17 g by mouth daily.  Drink 17g (1 scoop) dissolved in water per day. (Patient not taking: Reported on 11/26/2021) 255 g 0   Turmeric 500 MG CAPS Take 1,000 mg by mouth daily.     No current facility-administered medications for this visit.    Allergies as of 11/26/2021 - Review Complete 11/26/2021  Allergen Reaction Noted   Januvia [sitagliptin] Nausea And Vomiting and Other (See Comments) 12/16/2017   Metformin and related Diarrhea 12/16/2017   Penicillins Other (See Comments) 05/01/2017    Family History  Problem Relation Age of Onset   Heart disease Father    Hyperlipidemia Father    Hypertension Father    Colon cancer Neg Hx    Esophageal cancer Neg Hx    Stomach cancer Neg Hx    Rectal cancer Neg Hx     Social History   Socioeconomic History   Marital status: Widowed    Spouse name: Glendell Docker   Number of children: 1   Years of education: Not on file   Highest education level: Not on file  Occupational History   Not on file  Tobacco Use   Smoking status: Some Days    Packs/day: 0.75    Years: 45.00    Pack years: 33.75    Types: Cigarettes    Start date: 05/01/1972    Last attempt to quit: 09/30/2017    Years since quitting: 4.1   Smokeless tobacco: Never  Vaping Use   Vaping Use: Never used  Substance and Sexual Activity   Alcohol use: Yes    Alcohol/week: 7.0 standard drinks    Types: 7 Standard drinks or equivalent per week    Comment: daily: 2-3 wine   Drug use: No   Sexual activity: Not Currently  Other Topics Concern   Not on file  Social History Narrative   Not on file   Social Determinants of Health   Financial Resource Strain: Not on file  Food Insecurity: Not on file  Transportation Needs: Not on file  Physical Activity: Not on file  Stress: Not on file  Social Connections: Not on file  Intimate Partner Violence: Not on file    Physical Exam: Vital signs in last 24 hours: @BP  (!)  192/102 Comment: Simultaneous filing. User may not have seen previous data.   Pulse 75    Temp 98.6 F (37 C) (Temporal)    Resp (!) 25 Comment: Simultaneous filing. User may not have seen previous data.   Ht 5' 6"  (1.676 m)    Wt 194 lb (88 kg)    SpO2 (!) 85% Comment: Simultaneous filing. User may not have seen previous data.   BMI 31.31 kg/m  GEN: NAD EYE: Sclerae anicteric ENT: MMM CV: Non-tachycardic Pulm: CTA b/l GI: Soft, NT/ND NEURO:  Alert & Oriented x 3   Gerrit Heck, DO [facility]   11/26/2021 9:31 AM

## 2021-11-26 NOTE — Telephone Encounter (Signed)
According to covermymeds "The covered alternatives are: mesalamine 800 mg DR tablets and balsalazide capsules" ? ?Im trying to see if I can get in touch with someone over the phone regarding this ?

## 2021-11-26 NOTE — Progress Notes (Signed)
Report to PACU, RN, vss, BBS= Clear.  

## 2021-11-26 NOTE — Telephone Encounter (Signed)
If unable to get PA for Lialda, would plan for Delzicol 800 mg TID since that appears to be one of the covered mesalamine agents. Please confirm that that is the correct agent covered by her insurance.  ?

## 2021-11-26 NOTE — Progress Notes (Signed)
Vital signs checked by:CW ? ?The medical and surgical history was reviewed and verified with the patient. ? ?

## 2021-11-26 NOTE — Telephone Encounter (Signed)
Please see note below from [place] and advise.  ?

## 2021-11-26 NOTE — Progress Notes (Signed)
Called to room to assist during endoscopic procedure.  Patient ID and intended procedure confirmed with present staff. Received instructions for my participation in the procedure from the performing physician.  

## 2021-11-26 NOTE — Patient Instructions (Signed)
YOU HAD AN ENDOSCOPIC PROCEDURE TODAY AT THE [facility]:   Refer to the procedure report that was given to you for any specific questions about what was found during the examination.  If the procedure report does not answer your questions, please call your gastroenterologist to clarify.  If you requested that your care partner not be given the details of your procedure findings, then the procedure report has been included in a sealed envelope for you to review at your convenience later.  YOU SHOULD EXPECT: Some feelings of bloating in the abdomen. Passage of more gas than usual.  Walking can help get rid of the air that was put into your GI tract during the procedure and reduce the bloating. If you had a lower endoscopy (such as a colonoscopy or flexible sigmoidoscopy) you may notice spotting of blood in your stool or on the toilet paper. If you underwent a bowel prep for your procedure, you may not have a normal bowel movement for a few days.  Please Note:  You might notice some irritation and congestion in your nose or some drainage.  This is from the oxygen used during your procedure.  There is no need for concern and it should clear up in a day or so.  SYMPTOMS TO REPORT IMMEDIATELY:   Following lower endoscopy (colonoscopy or flexible sigmoidoscopy):  Excessive amounts of blood in the stool  Significant tenderness or worsening of abdominal pains  Swelling of the abdomen that is new, acute  Fever of 100F or higher  For urgent or emergent issues, a gastroenterologist can be reached at any hour by calling [phone]. Do not use MyChart messaging for urgent concerns.    DIET:  We do recommend a small meal at first, but then you may proceed to your regular diet.  Drink plenty of fluids but you should avoid alcoholic beverages for 24 hours.  ACTIVITY:  You should plan to take it easy for the rest of today and you should NOT DRIVE or use heavy machinery until tomorrow (because  of the sedation medicines used during the test).    FOLLOW UP: Our staff will call the number listed on your records 48-72 hours following your procedure to check on you and address any questions or concerns that you may have regarding the information given to you following your procedure. If we do not reach you, we will leave a message.  We will attempt to reach you two times.  During this call, we will ask if you have developed any symptoms of COVID 19. If you develop any symptoms (ie: fever, flu-like symptoms, shortness of breath, cough etc.) before then, please call [phone].  If you test positive for Covid 19 in the 2 weeks post procedure, please call and report this information to us.    If any biopsies were taken you will be contacted by phone or by letter within the next 1-3 weeks.  Please call us at [phone] if you have not heard about the biopsies in 3 weeks.    SIGNATURES/CONFIDENTIALITY: You and/or your care partner have signed paperwork which will be entered into your electronic medical record.  These signatures attest to the fact that that the information above on your After Visit Summary has been reviewed and is understood.  Full responsibility of the confidentiality of this discharge information lies with you and/or your care-partner. 

## 2021-11-26 NOTE — Op Note (Signed)
[facility] ?Patient Name: Victoria Eaton ?Procedure Date: 11/26/2021 8:43 AM ?MRN: [id number] ?Endoscopist: Gerrit Heck , MD ?Age: 61 ?Referring MD:  ?Date of Birth: 09-18-1961 ?Gender: Female ?Account #: [id number] ?Procedure:                Colonoscopy ?Indications:              Chronic diarrhea ?                          Elevated ESR, CRP, and fecal calprotectin. Normal  ?                          GI PCR panel. ?Medicines:                Monitored Anesthesia Care ?Procedure:                Pre-Anesthesia Assessment: ?                          - Prior to the procedure, a History and Physical  ?                          was performed, and patient medications and  ?                          allergies were reviewed. The patient's tolerance of  ?                          previous anesthesia was also reviewed. The risks  ?                          and benefits of the procedure and the sedation  ?                          options and risks were discussed with the patient.  ?                          All questions were answered, and informed consent  ?                          was obtained. Prior Anticoagulants: The patient has  ?                          taken no previous anticoagulant or antiplatelet  ?                          agents. ASA Grade Assessment: III - A patient with  ?                          severe systemic disease. After reviewing the risks  ?                          and benefits, the patient was deemed in  ?  satisfactory condition to undergo the procedure. ?                          After obtaining informed consent, the colonoscope  ?                          was passed under direct vision. Throughout the  ?                          procedure, the patient's blood pressure, pulse, and  ?                          oxygen saturations were monitored continuously. The  ?                          CF [id number] #[id number] was introduced through the anus  ?                           and advanced to the the terminal ileum. The  ?                          colonoscopy was performed without difficulty. The  ?                          patient tolerated the procedure well. The quality  ?                          of the bowel preparation was good. The terminal  ?                          ileum, ileocecal valve, appendiceal orifice, and  ?                          rectum were photographed. ?Scope In: 8:55:19 AM ?Scope Out: 9:16:10 AM ?Scope Withdrawal Time: 0 hours 18 minutes 49 seconds  ?Total Procedure Duration: 0 hours 20 minutes 51 seconds  ?Findings:                 Skin tags were found on perianal exam. ?                          A 6 mm polyp was found in the appendiceal orifice.  ?                          The polyp was sessile. The polyp was removed with a  ?                          cold snare. Resection and retrieval were complete.  ?                          Estimated blood loss was minimal. ?                          Three sessile polyps were found in the transverse  ?  colon. The polyps were 2 to 4 mm in size. These  ?                          polyps were removed with a cold snare. Resection  ?                          and retrieval were complete. Estimated blood loss  ?                          was minimal. ?                          Inflammation characterized by congestion (edema),  ?                          erythema, erosions, and shallow ulcerations was  ?                          found as large patches surrounded by normal mucosa  ?                          in the sigmoid colon, in the descending colon, and  ?                          in the transverse colon. The rectum and the  ?                          ascending colon were spared. This was moderate in  ?                          severity, and the findings are new. Biopsies were  ?                          taken with a cold forceps for histology. Estimated  ?                          blood loss was  minimal. ?                          The retroflexed view of the distal rectum and anal  ?                          verge was normal and showed no anal or rectal  ?                          abnormalities. ?                          The terminal ileum appeared normal. ?Complications:            No immediate complications. ?Estimated Blood Loss:     Estimated blood loss was minimal. ?Impression:               - Perianal skin tags found on perianal exam. ?                          -  One 6 mm polyp at the appendiceal orifice,  ?                          removed with a cold snare. Resected and retrieved. ?                          - Three 2 to 4 mm polyps in the transverse colon,  ?                          removed with a cold snare. Resected and retrieved. ?                          - Scattered inflammation in the sigmoid,  ?                          descending, transverse colon, along with suspected  ?                          pseudopolyps in the sigmoid, transverse (resected),  ?                          and AO (resected). The endoscopic appearance and  ?                          clinical presentaiton are most consistent with  ?                          Crohn's disease with colonic involvement. This was  ?                          moderate in severity. Several biopsies taken  ?                          throughout the colon. ?                          - The distal rectum and anal verge are normal on  ?                          retroflexion view. ?                          - The examined portion of the ileum was normal. ?Recommendation:           - Patient has a contact number available for  ?                          emergencies. The signs and symptoms of potential  ?                          delayed complications were discussed with the  ?                          patient. Return to normal activities tomorrow.  ?  Written discharge instructions were provided to the  ?                           patient. ?                          - Resume previous diet. ?                          - Continue present medications. ?                          - Await pathology results. ?                          - Repeat colonoscopy for surveillance based on  ?                          pathology results. ?                          - Use Lialda 1.2 gm at 4 tabs PO daily for 8 weeks,  ?                          then reduce to 2.4 gm/day. ?                          - Check BMP in 2 weeks. ?                          - Return to GI clinic in 4-6 weeks. ?Gerrit Heck, MD ?11/26/2021 9:29:04 AM ?

## 2021-11-27 ENCOUNTER — Other Ambulatory Visit: Payer: Self-pay

## 2021-11-27 MED ORDER — SULFASALAZINE 500 MG PO TBEC: 1000.0000 mg | DELAYED_RELEASE_TABLET | Freq: Two times a day (BID) | ORAL | 11 refills | Status: DC

## 2021-11-27 MED ORDER — SULFASALAZINE 500 MG PO TBEC: 1500.0000 mg | DELAYED_RELEASE_TABLET | Freq: Two times a day (BID) | ORAL | 0 refills | Status: DC

## 2021-11-27 NOTE — Telephone Encounter (Signed)
Thank you Jerene Pitch, I didn't see Dr. Vivia Ewing message until later this morning. The sulfasalazine is covered. I sent Dr. Bryan Lemma a note to see if we should taper this like the lialda so I'm waiting to hear back.  ? ?Cirigliano, Unice Bailey, RN ?Actually, can we look to see if sulfasalazine is covered instead? Would be 1.5 gm BID to start.  ?

## 2021-11-27 NOTE — Telephone Encounter (Signed)
Sulfasalazine prescription sent to pt's pharmacy. Attempted to call pt to let her know about dosing. Left message for pt to call back.  ?

## 2021-11-27 NOTE — Telephone Encounter (Signed)
Spoke with pt and let her now that the sulfasalazine was sent to her pharmacy and that she should take the higher dose for 8 weeks and then once she finished that she can take the lower dose. Pt verbalized understanding and had no other concerns at end of call.  ?

## 2021-11-27 NOTE — Telephone Encounter (Signed)
Delzicol is the brand name for mesalamine. Please type in the generic version for the 848m tablet.  ? ?Dr C for this patient what is the direction as well? Her Lialda was 4.8g daily with breakfast for 56 days and then 2 tablets daily. How are we doing the mesalamine 800 TID is it continuous or not? Thanks  ? ?Ive been on the phone for over 40 minutes trying to get someone. ?

## 2021-11-28 ENCOUNTER — Telehealth: Payer: Self-pay

## 2021-11-28 ENCOUNTER — Telehealth: Payer: Self-pay | Admitting: *Deleted

## 2021-11-28 ENCOUNTER — Inpatient Hospital Stay: Admission: RE | Admit: 2021-11-28 | Payer: Managed Care, Other (non HMO) | Source: Ambulatory Visit

## 2021-11-28 ENCOUNTER — Other Ambulatory Visit: Payer: Self-pay

## 2021-11-28 DIAGNOSIS — K529 Noninfective gastroenteritis and colitis, unspecified: Secondary | ICD-10-CM

## 2021-11-28 NOTE — Telephone Encounter (Signed)
Pt scheduled for follow up as requested on colonoscopy report from 11/26/21. Pt scheduled for 01/02/22 at 10:40 am with Dr. Bryan Lemma. BMP order entered for pt to get done in 2 weeks. Pt requested to get this done at the [facility] office. Reminder placed in epic. Pt verbalized understanding and had no other concerns at end of call.  ?

## 2021-11-28 NOTE — Telephone Encounter (Signed)
?  Follow up Call- ? ?Call back number 11/26/2021  ?Post procedure Call Back phone  # [phone]  ?Permission to leave phone message Yes  ?Some recent data might be hidden  ?  ? ?Patient questions: ? ?Do you have a fever, pain , or abdominal swelling? No. ?Pain Score  0 * ? ?Have you tolerated food without any problems? Yes.   ? ?Have you been able to return to your normal activities? Yes.   ? ?Do you have any questions about your discharge instructions: ?Diet   No. ?Medications  No. ?Follow up visit  No. ? ?Do you have questions or concerns about your Care? No. ? ?Actions: ?* If pain score is 4 or above: ?No action needed, pain <4. ? ? ?

## 2021-11-29 ENCOUNTER — Other Ambulatory Visit: Payer: Self-pay

## 2021-11-29 DIAGNOSIS — I214 Non-ST elevation (NSTEMI) myocardial infarction: Secondary | ICD-10-CM

## 2021-11-29 LAB — PANCREATIC ELASTASE, FECAL: Pancreatic Elastase-1, Stool: 388 mcg/g

## 2021-11-29 MED ORDER — CLOPIDOGREL BISULFATE 75 MG PO TABS: 75.0000 mg | ORAL_TABLET | Freq: Every day | ORAL | 2 refills | Status: DC

## 2021-12-01 ENCOUNTER — Other Ambulatory Visit: Payer: Self-pay | Admitting: Family Medicine

## 2021-12-01 DIAGNOSIS — E118 Type 2 diabetes mellitus with unspecified complications: Secondary | ICD-10-CM

## 2021-12-02 ENCOUNTER — Encounter: Payer: Self-pay | Admitting: Family Medicine

## 2021-12-02 MED ORDER — BASAGLAR KWIKPEN 100 UNIT/ML ~~LOC~~ SOPN: 48.0000 [IU] | PEN_INJECTOR | Freq: Every day | SUBCUTANEOUS | 3 refills | Status: DC

## 2021-12-11 ENCOUNTER — Encounter: Payer: Self-pay | Admitting: Obstetrics and Gynecology

## 2021-12-11 ENCOUNTER — Other Ambulatory Visit: Payer: Self-pay

## 2021-12-11 ENCOUNTER — Ambulatory Visit
Admission: RE | Admit: 2021-12-11 | Discharge: 2021-12-11 | Disposition: A | Discharge status: Home / Self Care | Payer: Managed Care, Other (non HMO) | Source: Ambulatory Visit | Attending: Acute Care | Admitting: Acute Care

## 2021-12-11 ENCOUNTER — Ambulatory Visit (INDEPENDENT_AMBULATORY_CARE_PROVIDER_SITE_OTHER): Payer: Managed Care, Other (non HMO) | Admitting: Obstetrics and Gynecology

## 2021-12-11 VITALS — BP 132/79 | HR 69

## 2021-12-11 DIAGNOSIS — N8110 Cystocele, unspecified: Secondary | ICD-10-CM

## 2021-12-11 DIAGNOSIS — N993 Prolapse of vaginal vault after hysterectomy: Secondary | ICD-10-CM | POA: Diagnosis not present

## 2021-12-11 DIAGNOSIS — F17210 Nicotine dependence, cigarettes, uncomplicated: Secondary | ICD-10-CM

## 2021-12-11 DIAGNOSIS — Z87891 Personal history of nicotine dependence: Secondary | ICD-10-CM

## 2021-12-11 NOTE — Progress Notes (Signed)
[facility] ? ?Date of Visit: 12/11/2021 ? ?History of Present Illness: Victoria Eaton is a 61 y.o. female scheduled today for a post-operative visit.  ?? Surgery: s/p TVH, USLS, A/P repair with perineorrhaphy, urethral bulking and cystoscopy on 07/09/21 ? ? ?Feels that the bulge has gotten slightly larger but is not sure.  ? ?Medications: She has a current medication list which includes the following prescription(s): acetaminophen, amlodipine, aspirin, bupropion, clopidogrel, freestyle libre 14 day sensor, trulicity, ezetimibe, novolog flexpen, basaglar kwikpen, ulticare micro pen needles, iron (ferrous sulfate), isosorbide mononitrate, lisinopril, loratadine, methocarbamol, metoprolol tartrate, multivitamin with minerals, nitroglycerin, fish oil, oxycodone, polyethylene glycol powder, rosuvastatin, sertraline, sulfasalazine, sulfasalazine, and turmeric.  ? ?Allergies: Patient is allergic to [place] [sitagliptin], metformin and related, and penicillins.  ? ?Physical Exam: ?BP 132/79   Pulse 69   ? ?Pelvic Examination: Speculum exam reveals normal vaginal mucosa.  ? ?POP-Q: ?POP-Q ? ?2  ?                                          Aa   ?2 ?                                          Ba  ?-6  ?                                            C  ? ?4.5  ?                                          Gh  ?4  ?                                          Pb  ?9  ?                                          tvl  ? ?-2.5  ?                                          Ap  ?-2.5  ?                                          Bp  ?   ?                                            D  ? ?Previous POP-Q ?POP-Q (12/31/20):  ?  ?POP-Q ?  ?3  ?  Aa   ?7 ?                                          Ba   ?8  ?                                            C  ?  ?6  ?                                          Gh   ?2.5  ?                                          Pb   ?8  ?                                           tvl  ?  ?3  ?                                          Ap   ?5  ?                                          Bp   ?-4  ?                                            D  ?  ? ?--------------------------------------------------------- ? ?Assessment and Plan:  ?1. Prolapse of anterior vaginal wall   ?2. Vaginal vault prolapse after hysterectomy   ? ?- We discussed options for prolapse including expectant management, pessary or surgery. Would not recommend native tissue repair as this has already failed for her. We discussed the option of sacrocolpopexy.  ?- She would like to proceed with surgery but not at this time- maybe later in the year. She will contact us when she is ready to proceed. ? ?All questions answered.  ? ?Victoria Folds, MD ? ?Time spent: I spent 20 minutes dedicated to the care of this patient on the date of this encounter to include pre-visit review of records, face-to-face time with the patient and post visit documentation. ? ?

## 2021-12-12 ENCOUNTER — Telehealth: Payer: Self-pay

## 2021-12-12 ENCOUNTER — Encounter: Payer: Self-pay | Admitting: Family Medicine

## 2021-12-12 NOTE — Telephone Encounter (Signed)
Spoke with pt. Pt stated that she has an appt with her PCP on Monday 3/27 and they are going to complete blood work at that appt. Pt requested that she have lab work done at that appt. Let pt know that if bmp is not done at that appt she can come to our office. Pt verbalized understanding and had no other concerns.  ?

## 2021-12-12 NOTE — Telephone Encounter (Signed)
Left message for pt to call back  °

## 2021-12-12 NOTE — Telephone Encounter (Signed)
-----   Message from Marice Potter, RN sent at 11/28/2021 11:39 AM EST ----- ?Regarding: lab ?Pt needs bmp. Order in epic.  ? ?

## 2021-12-13 ENCOUNTER — Telehealth: Payer: Self-pay | Admitting: Acute Care

## 2021-12-13 DIAGNOSIS — R911 Solitary pulmonary nodule: Secondary | ICD-10-CM

## 2021-12-13 NOTE — Telephone Encounter (Signed)
Lee from [facility] called the following report:  ? ?CLINICAL DATA:  Thirty-three pack-year smoking history. Current ?smoker. ?  ?EXAM: ?CT CHEST WITHOUT CONTRAST LOW-DOSE FOR LUNG CANCER SCREENING ?  ?TECHNIQUE: ?Multidetector CT imaging of the chest was performed following the ?standard protocol without IV contrast. ?  ?RADIATION DOSE REDUCTION: This exam was performed according to the ?departmental dose-optimization program which includes automated ?exposure control, adjustment of the mA and/or kV according to ?patient size and/or use of iterative reconstruction technique. ?  ?COMPARISON:  06/07/2021 chest radiograph. CTA chest 12/12/2018. No ?prior screening CT. ?  ?FINDINGS: ?Cardiovascular: Aortic atherosclerosis. Tortuous thoracic aorta. ?Median sternotomy for CABG. Mild cardiomegaly. ?  ?Mediastinum/Nodes: No mediastinal or definite hilar adenopathy, ?given limitations of unenhanced CT. ?  ?Lungs/Pleura: No pleural fluid. Mild centrilobular and paraseptal ?emphysema. Right lower lobe pulmonary nodule of volume derived ?equivalent diameter 5.9 mm. ?  ?A left upper lobe apparent pulmonary nodule which is contiguous with ?degenerative changes of the first rib/sternal articulation. This is ?new since 12/12/2018 and measures on the order of volume derived ?equivalent diameter 11.1 mm including on 60/9. ?  ?Upper Abdomen: Multiple small gallstones. Normal imaged portions of ?the liver, spleen, stomach, pancreas, adrenal glands, right kidney. ?Cortical scarring involves the upper pole left kidney. ?  ?Musculoskeletal: No acute osseous abnormality. Lower thoracic ?spondylosis. ?  ?IMPRESSION: ?1. Lung-RADS 4A, suspicious. Follow up low-dose chest CT without ?contrast in 3 months (please use the following order, "CT CHEST LCS ?NODULE FOLLOW-UP W/O CM") is recommended. Alternatively, PET may be ?considered when there is a solid component 73m or larger. Left upper ?lobe pleural-based probable pulmonary nodule,  contiguous with ?degenerative changes of the first rib/sternal articulation. ?2. Aortic atherosclerosis (ICD10-I70.0) and emphysema (ICD10-J43.9). ?3. Cholelithiasis. ?  ?These results will be called to the ordering clinician or ?representative by the Radiologist Assistant, and communication ?documented in the PACS or [employer] ?  ?SJudson Rochplease advise.  ?  ?

## 2021-12-13 NOTE — Telephone Encounter (Signed)
Dr Lamonte Sakai as reviewed CT results. His recommendation is for pt to have a PET scan then ov with him to f/u to discuss results.  ?Left message for pt to call back to review results with her.  ?

## 2021-12-15 NOTE — Progress Notes (Signed)
? ?HPI: ?[name] is a 61 y.o. female, who is here today for her routine physical. ? ?Last CPE: 09/06/19 ? ?Regular exercise: She does not but started being more active outdoors,gardening. ?Following a healthy diet: She cooks most of the time. Salads, fruits,vegetables,and mainly fish. Little red meat and eats some chicken. ? ?Chronic medical problems: DM II,HTN,CAD s/p CABG,HLD, depression,and anxiety among some. ? ?Immunization History  ?Administered Date(s) Administered  ? Influenza Inj Mdck Quad Pf 05/23/2019  ? Influenza,inj,Quad PF,6+ Mos 05/16/2017, 11/07/2020, 09/06/2021  ? Influenza,inj,quad, With Preservative 05/15/2018  ? Influenza-Unspecified 05/16/2017  ? PFIZER(Purple Top)SARS-COV-2 Vaccination 01/05/2020, 01/27/2020, 07/31/2020  ? Pneumococcal Polysaccharide-23 01/07/2018  ? Td 04/16/2020  ? Zoster Recombinat (Shingrix) 05/15/2018, 08/10/2018  ? ?Health Maintenance  ?Topic Date Due  ? COVID-19 Vaccine (4 - Booster for Pfizer series) 09/25/2020  ? MAMMOGRAM  09/05/2021  ? OPHTHALMOLOGY EXAM  04/08/2022  ? HEMOGLOBIN A1C  06/18/2022  ? PAP SMEAR-Modifier  09/05/2022  ? FOOT EXAM  09/06/2022  ? TETANUS/TDAP  04/16/2030  ? COLONOSCOPY (Pts 45-73yr Insurance coverage will need to be confirmed)  11/27/2031  ? INFLUENZA VACCINE  Completed  ? Hepatitis C Screening  Completed  ? HIV Screening  Completed  ? Zoster Vaccines- Shingrix  Completed  ? HPV VACCINES  Aged Out  ? ?Last mammogram 08/2021 at her gyn's office. ? ?DM II: On Trulicity 1.5 mg weekly and Basaglar 48 U daily. ?BS's pretty good ?She does not take Novolog daily , once in the last month. ? ?Lab Results  ?Component Value Date  ? HGBA1C 6.8 09/06/2021  ? ?Lab Results  ?Component Value Date  ? LABMICR 18.3 05/23/2019  ? LABMICR 9.9 05/01/2017  ? MICROALBUR 3.7 (H) 09/06/2021  ? MICROALBUR 49.1 (H) 03/14/2021  ? ?HLD: She is on Crestor 40 mg daily and Zetia 10 mg daily. ?Lab Results  ?Component Value Date  ? CHOL 173 03/14/2021  ? HDL  69.40 03/14/2021  ? LTyler81 03/14/2021  ? TRIG 113.0 03/14/2021  ? CHOLHDL 2 03/14/2021  ? ?HTN and CAD on Amlodipine 5 mg daily,Imdur 120 mg daily,Metoprolol tartrate 50 mg bid,and Lisinopril 5 mg daily. ? ?Lab Results  ?Component Value Date  ? CREATININE 1.04 (H) 10/31/2021  ? BUN 18 10/31/2021  ? NA 143 10/31/2021  ? K 4.6 10/31/2021  ? CL 104 10/31/2021  ? CO2 23 10/31/2021  ? ?2 drinks daily: beer, bourbon,gin,or wine  ? ?She is asking if any of her meds can cause dizziness,she has had mild intermittent episodes for the past few weeks. Reports new med started by her GI. ?Lab Results  ?Component Value Date  ? WBC 7.1 07/03/2021  ? HGB 11.4 (L) 07/03/2021  ? HCT 36.8 07/03/2021  ? MCV 79.7 (L) 07/03/2021  ? PLT 180 07/03/2021  ? ?Review of Systems  ?Constitutional:  Positive for fatigue. Negative for appetite change and fever.  ?HENT:  Negative for dental problem, hearing loss, mouth sores, sore throat, trouble swallowing and voice change.   ?Eyes:  Negative for redness and visual disturbance.  ?Respiratory:  Negative for cough, shortness of breath and wheezing.   ?Cardiovascular:  Negative for chest pain and leg swelling.  ?Gastrointestinal:  Negative for abdominal pain, nausea and vomiting.  ?     No changes in bowel habits.  ?Endocrine: Negative for cold intolerance, heat intolerance, polydipsia, polyphagia and polyuria.  ?Genitourinary:  Negative for decreased urine volume, dysuria, hematuria, vaginal bleeding and vaginal discharge.  ?Musculoskeletal:  Negative for arthralgias,  gait problem and myalgias.  ?Skin:  Negative for color change and rash.  ?Allergic/Immunologic: Negative for environmental allergies.  ?Neurological:  Positive for dizziness. Negative for syncope, weakness and headaches.  ?Hematological:  Negative for adenopathy. Does not bruise/bleed easily.  ?Psychiatric/Behavioral:  Positive for sleep disturbance (sleeping 4-5 hours.). Negative for confusion. The patient is nervous/anxious.    ?All other systems reviewed and are negative. ? ?Current Outpatient Medications on File Prior to Visit  ?Medication Sig Dispense Refill  ? acetaminophen (TYLENOL) 500 MG tablet Take 1 tablet (500 mg total) by mouth every 6 (six) hours as needed (pain). 30 tablet 0  ? amLODipine (NORVASC) 5 MG tablet TAKE ONE TABLET BY MOUTH ONE TIME DAILY 90 tablet 3  ? aspirin 81 MG EC tablet Take 81 mg by mouth daily.    ? buPROPion (WELLBUTRIN XL) 300 MG 24 hr tablet TAKE ONE TABLET BY MOUTH ONE TIME DAILY 90 tablet 2  ? clopidogrel (PLAVIX) 75 MG tablet Take 1 tablet (75 mg total) by mouth daily. 90 tablet 2  ? Continuous Blood Gluc Sensor (FREESTYLE LIBRE 14 DAY SENSOR) MISC APPLY ONE SENSOR TO THE BACK OF YOUR UPPER ARM. REPLACE EVERY 14 DAYS. 1 each 5  ? Dulaglutide (TRULICITY) 1.5 OV/7.0HE SOPN Inject 1.5 mg into the skin once a week. (Patient taking differently: Inject 1.5 mg into the skin every Tuesday.) 0.5 mL 6  ? ezetimibe (ZETIA) 10 MG tablet Take 1 tablet (10 mg total) by mouth daily. 30 tablet 6  ? insulin aspart (NOVOLOG FLEXPEN) 100 UNIT/ML FlexPen Inject 5 Units into the skin 3 (three) times daily with meals as needed for high blood sugar.    ? Insulin Glargine (BASAGLAR KWIKPEN) 100 UNIT/ML Inject 48 Units into the skin daily. 45 mL 3  ? Insulin Pen Needle (ULTICARE MICRO PEN NEEDLES) 32G X 4 MM MISC USE WITH INJECTIONS FOUR TIMES A DAY 400 each 3  ? Iron, Ferrous Sulfate, 325 (65 Fe) MG TABS Take 325 mg by mouth daily. 90 tablet 2  ? isosorbide mononitrate (IMDUR) 120 MG 24 hr tablet Take 1 tablet (120 mg total) by mouth daily. 90 tablet 3  ? lisinopril (ZESTRIL) 5 MG tablet TAKE ONE TABLET BY MOUTH ONE TIME DAILY 90 tablet 1  ? loratadine (CLARITIN) 10 MG tablet Take 10 mg by mouth daily.    ? methocarbamol (ROBAXIN) 500 MG tablet Take 1 tablet (500 mg total) by mouth 2 (two) times daily as needed. 20 tablet 0  ? metoprolol tartrate (LOPRESSOR) 50 MG tablet TAKE ONE TABLET BY MOUTH TWICE A DAY 180 tablet 2  ?  Multiple Vitamin (MULTIVITAMIN WITH MINERALS) TABS tablet Take 1 tablet by mouth daily.    ? nitroGLYCERIN (NITROSTAT) 0.4 MG SL tablet Place 0.4 mg under the tongue every 5 (five) minutes as needed for chest pain.    ? Omega-3 Fatty Acids (FISH OIL) 1200 MG CAPS Take 1,200 mg by mouth daily.    ? oxyCODONE (OXY IR/ROXICODONE) 5 MG immediate release tablet Take 1 tablet (5 mg total) by mouth every 4 (four) hours as needed for severe pain. 15 tablet 0  ? polyethylene glycol powder (GLYCOLAX/MIRALAX) 17 GM/SCOOP powder Take 17 g by mouth daily. Drink 17g (1 scoop) dissolved in water per day. 255 g 0  ? rosuvastatin (CRESTOR) 40 MG tablet TAKE ONE TABLET BY MOUTH ONE TIME DAILY 90 tablet 3  ? sertraline (ZOLOFT) 100 MG tablet TAKE TWO TABLETS BY MOUTH ONE TIME DAILY 180 tablet 2  ?  sulfaSALAzine (AZULFIDINE) 500 MG EC tablet Take 3 tablets (1,500 mg total) by mouth 2 (two) times daily. For 8 weeks 336 tablet 0  ? sulfaSALAzine (AZULFIDINE) 500 MG EC tablet Take 2 tablets (1,000 mg total) by mouth 2 (two) times daily. 120 tablet 11  ? Turmeric 500 MG CAPS Take 1,000 mg by mouth daily.    ? ?No current facility-administered medications on file prior to visit.  ? ?Past Medical History:  ?Diagnosis Date  ? Anxiety   ? Arthritis   ? "maybe in my left foot" (10/14/2017)  ? Bladder prolapse, female, acquired 09/14/2017  ? Coronary artery disease   ? Depression   ? High cholesterol   ? History of stomach ulcers   ? Hypertension   ? Myocardial infarction ([place]) 12/30/2004  ? Tobacco abuse   ? Type II diabetes mellitus ([place])   ? ? ?Past Surgical History:  ?Procedure Laterality Date  ? ANTERIOR AND POSTERIOR REPAIR N/A 07/09/2021  ? Procedure: ANTERIOR (CYSTOCELE) AND POSTERIOR REPAIR (RECTOCELE) with perineorrhaphy;  Surgeon: [name], MD;  Location: [facility];  Service: Gynecology;  Laterality: N/A;  ? CARDIAC CATHETERIZATION  10/14/2017  ? CARPAL TUNNEL RELEASE Right 04/10/2021  ? Procedure: RIGHT CARPAL TUNNEL RELEASE;   Surgeon: Leandrew Koyanagi, MD;  Location: [facility];  Service: Orthopedics;  Laterality: Right;  ? CORONARY ANGIOPLASTY WITH STENT PLACEMENT  12/30/2004  ? Patient reported  ? CORONARY ARTERY BYPASS GRAFT N/A 1/28

## 2021-12-16 ENCOUNTER — Ambulatory Visit (INDEPENDENT_AMBULATORY_CARE_PROVIDER_SITE_OTHER): Payer: Managed Care, Other (non HMO) | Admitting: Family Medicine

## 2021-12-16 ENCOUNTER — Encounter: Payer: Self-pay | Admitting: Family Medicine

## 2021-12-16 VITALS — BP 110/60 | HR 67 | Temp 98.9°F | Resp 16 | Ht 66.0 in | Wt 195.0 lb

## 2021-12-16 DIAGNOSIS — I25708 Atherosclerosis of coronary artery bypass graft(s), unspecified, with other forms of angina pectoris: Secondary | ICD-10-CM

## 2021-12-16 DIAGNOSIS — Z0000 Encounter for general adult medical examination without abnormal findings: Secondary | ICD-10-CM

## 2021-12-16 DIAGNOSIS — E785 Hyperlipidemia, unspecified: Secondary | ICD-10-CM

## 2021-12-16 DIAGNOSIS — E118 Type 2 diabetes mellitus with unspecified complications: Secondary | ICD-10-CM

## 2021-12-16 DIAGNOSIS — Z794 Long term (current) use of insulin: Secondary | ICD-10-CM

## 2021-12-16 DIAGNOSIS — R42 Dizziness and giddiness: Secondary | ICD-10-CM

## 2021-12-16 DIAGNOSIS — I10 Essential (primary) hypertension: Secondary | ICD-10-CM

## 2021-12-16 LAB — BASIC METABOLIC PANEL
BUN: 22 mg/dL (ref 6–23)
CO2: 26 mEq/L (ref 19–32)
Calcium: 9.5 mg/dL (ref 8.4–10.5)
Chloride: 104 mEq/L (ref 96–112)
Creatinine, Ser: 1 mg/dL (ref 0.40–1.20)
GFR: 60.19 mL/min (ref >60.00)
Glucose, Bld: 129 mg/dL — ABNORMAL HIGH (ref 70–99)
Potassium: 4.3 mEq/L (ref 3.5–5.1)
Sodium: 137 mEq/L (ref 135–145)

## 2021-12-16 LAB — LIPID PANEL
Cholesterol: 159 mg/dL (ref 0–200)
HDL: 72.8 mg/dL (ref >39.00)
LDL Cholesterol: 62 mg/dL (ref 0–99)
NonHDL: 85.82
Total CHOL/HDL Ratio: 2
Triglycerides: 119 mg/dL (ref 0.0–149.0)
VLDL: 23.8 mg/dL (ref 0.0–40.0)

## 2021-12-16 LAB — CBC
HCT: 40.6 % (ref 36.0–46.0)
Hemoglobin: 13 g/dL (ref 12.0–15.0)
MCHC: 32.1 g/dL (ref 30.0–36.0)
MCV: 83.8 fl (ref 78.0–100.0)
Platelets: 140 10*3/uL — ABNORMAL LOW (ref 150.0–400.0)
RBC: 4.84 Mil/uL (ref 3.87–5.11)
RDW: 17.7 % — ABNORMAL HIGH (ref 11.5–15.5)
WBC: 5.9 10*3/uL (ref 4.0–10.5)

## 2021-12-16 LAB — HEPATIC FUNCTION PANEL
ALT: 24 U/L (ref 0–35)
AST: 21 U/L (ref 0–37)
Albumin: 4.2 g/dL (ref 3.5–5.2)
Alkaline Phosphatase: 68 U/L (ref 39–117)
Bilirubin, Direct: 0.1 mg/dL (ref 0.0–0.3)
Total Bilirubin: 0.4 mg/dL (ref 0.2–1.2)
Total Protein: 7.8 g/dL (ref 6.0–8.3)

## 2021-12-16 LAB — HEMOGLOBIN A1C: Hgb A1c MFr Bld: 7.5 % — ABNORMAL HIGH (ref 4.6–6.5)

## 2021-12-16 NOTE — Telephone Encounter (Signed)
I have called and spoke with pt regarding CT results/ I explained to pt that we will order and schedule a PET scan to look at this area further. Pt did say that she has had a lot of congestion going on lately. Pt is aware that she will receive all call once PET scan is scheduled and then we will get her scheduled for PFT's and a f/u appt with Dr Ileene Musa. Pt verbalized understanding.  ?

## 2021-12-16 NOTE — Patient Instructions (Addendum)
A few things to remember from today's visit: ? ?Essential hypertension - Plan: Basic metabolic panel ? ?Type 2 diabetes mellitus with complication, with long-term current use of insulin ([place]) - Plan: Hemoglobin A1c, Microalbumin / creatinine urine ratio ? ?Hyperlipidemia LDL goal <70 - Plan: Hepatic function panel, Lipid panel ? ?Routine general medical examination at a health care facility ? ?If you need refills please call your pharmacy. ?Do not use My Chart to request refills or for acute issues that need immediate attention. ?  ?Please be sure medication list is accurate. ?If a new problem present, please set up appointment sooner than planned today. ?No changes today. ?Try to decrease alcohol intake. ? ?Health Maintenance, Female ?Adopting a healthy lifestyle and getting preventive care are important in promoting health and wellness. Ask your health care provider about: ?The right schedule for you to have regular tests and exams. ?Things you can do on your own to prevent diseases and keep yourself healthy. ?What should I know about diet, weight, and exercise? ?Eat a healthy diet ? ?Eat a diet that includes plenty of vegetables, fruits, low-fat dairy products, and lean protein. ?Do not eat a lot of foods that are high in solid fats, added sugars, or sodium. ?Maintain a healthy weight ?Body mass index (BMI) is used to identify weight problems. It estimates body fat based on height and weight. Your health care provider can help determine your BMI and help you achieve or maintain a healthy weight. ?Get regular exercise ?Get regular exercise. This is one of the most important things you can do for your health. Most adults should: ?Exercise for at least 150 minutes each week. The exercise should increase your heart rate and make you sweat (moderate-intensity exercise). ?Do strengthening exercises at least twice a week. This is in addition to the moderate-intensity exercise. ?Spend less time sitting. Even light  physical activity can be beneficial. ?Watch cholesterol and blood lipids ?Have your blood tested for lipids and cholesterol at 63 years of age, then have this test every 5 years. ?Have your cholesterol levels checked more often if: ?Your lipid or cholesterol levels are high. ?You are older than 63 years of age. ?You are at high risk for heart disease. ?What should I know about cancer screening? ?Depending on your health history and family history, you may need to have cancer screening at various ages. This may include screening for: ?Breast cancer. ?Cervical cancer. ?Colorectal cancer. ?Skin cancer. ?Lung cancer. ?What should I know about heart disease, diabetes, and high blood pressure? ?Blood pressure and heart disease ?High blood pressure causes heart disease and increases the risk of stroke. This is more likely to develop in people who have high blood pressure readings or are overweight. ?Have your blood pressure checked: ?Every 3-5 years if you are 65-34 years of age. ?Every year if you are 62 years old or older. ?Diabetes ?Have regular diabetes screenings. This checks your fasting blood sugar level. Have the screening done: ?Once every three years after age 13 if you are at a normal weight and have a low risk for diabetes. ?More often and at a younger age if you are overweight or have a high risk for diabetes. ?What should I know about preventing infection? ?Hepatitis B ?If you have a higher risk for hepatitis B, you should be screened for this virus. Talk with your health care provider to find out if you are at risk for hepatitis B infection. ?Hepatitis C ?Testing is recommended for: ?Everyone born from [birth year]  through 1967. ?Anyone with known risk factors for hepatitis C. ?Sexually transmitted infections (STIs) ?Get screened for STIs, including gonorrhea and chlamydia, if: ?You are sexually active and are younger than 63 years of age. ?You are older than 63 years of age and your health care provider tells you  that you are at risk for this type of infection. ?Your sexual activity has changed since you were last screened, and you are at increased risk for chlamydia or gonorrhea. Ask your health care provider if you are at risk. ?Ask your health care provider about whether you are at high risk for HIV. Your health care provider may recommend a prescription medicine to help prevent HIV infection. If you choose to take medicine to prevent HIV, you should first get tested for HIV. You should then be tested every 3 months for as long as you are taking the medicine. ?Pregnancy ?If you are about to stop having your period (premenopausal) and you may become pregnant, seek counseling before you get pregnant. ?Take 400 to 800 micrograms (mcg) of folic acid every day if you become pregnant. ?Ask for birth control (contraception) if you want to prevent pregnancy. ?Osteoporosis and menopause ?Osteoporosis is a disease in which the bones lose minerals and strength with aging. This can result in bone fractures. If you are 7 years old or older, or if you are at risk for osteoporosis and fractures, ask your health care provider if you should: ?Be screened for bone loss. ?Take a calcium or vitamin D supplement to lower your risk of fractures. ?Be given hormone replacement therapy (HRT) to treat symptoms of menopause. ?Follow these instructions at home: ?Alcohol use ?Do not drink alcohol if: ?Your health care provider tells you not to drink. ?You are pregnant, may be pregnant, or are planning to become pregnant. ?If you drink alcohol: ?Limit how much you have to: ?0-1 drink a day. ?Know how much alcohol is in your drink. In the [place] one drink equals one 12 oz bottle of beer (355 mL), one 5 oz glass of wine (148 mL), or one 1? oz glass of hard liquor (44 mL). ?Lifestyle ?Do not use any products that contain nicotine or tobacco. These products include cigarettes, chewing tobacco, and vaping devices, such as e-cigarettes. If you need help  quitting, ask your health care provider. ?Do not use street drugs. ?Do not share needles. ?Ask your health care provider for help if you need support or information about quitting drugs. ?General instructions ?Schedule regular health, dental, and eye exams. ?Stay current with your vaccines. ?Tell your health care provider if: ?You often feel depressed. ?You have ever been abused or do not feel safe at home. ?Summary ?Adopting a healthy lifestyle and getting preventive care are important in promoting health and wellness. ?Follow your health care provider's instructions about healthy diet, exercising, and getting tested or screened for diseases. ?Follow your health care provider's instructions on monitoring your cholesterol and blood pressure. ?This information is not intended to replace advice given to you by your health care provider. Make sure you discuss any questions you have with your health care provider. ?Document Revised: 01/28/2021 Document Reviewed: 01/28/2021 ?Elsevier Patient Education ? [place]. ? ?

## 2021-12-17 ENCOUNTER — Telehealth: Payer: Self-pay

## 2021-12-17 NOTE — Telephone Encounter (Signed)
Pt had BMP as requested on 3/7 colonoscopy report. Pt completed bmp at PCP's office.  ?

## 2021-12-17 NOTE — Telephone Encounter (Signed)
-----   Message from Marice Potter, RN sent at 12/12/2021  9:27 AM EDT ----- ?Regarding: lab ?Check if pt completed BMP at PCP appt.  ? ?

## 2021-12-17 NOTE — Telephone Encounter (Signed)
Left detailed message on pt's voicemail and let her know she can call the office if she has any questions. Reminder for repeat BMP placed.  ?

## 2021-12-17 NOTE — Telephone Encounter (Signed)
Thank you.  BMP reviewed and normal, to include normal electrolytes and normal renal function.  Okay to continue medications as prescribed.  Plan for repeat BMP in 4-6 weeks to ensure continued stability.  If normal, can liberalize BMP checks. ?

## 2021-12-18 ENCOUNTER — Encounter: Payer: Managed Care, Other (non HMO) | Admitting: *Deleted

## 2021-12-18 DIAGNOSIS — R911 Solitary pulmonary nodule: Secondary | ICD-10-CM

## 2021-12-18 DIAGNOSIS — Z006 Encounter for examination for normal comparison and control in clinical research program: Secondary | ICD-10-CM

## 2021-12-19 DIAGNOSIS — I25708 Atherosclerosis of coronary artery bypass graft(s), unspecified, with other forms of angina pectoris: Secondary | ICD-10-CM | POA: Insufficient documentation

## 2021-12-19 NOTE — Research (Signed)
Title: NIGHTINGALE: CliNIcal Utility of ManaGement of Patients witH CT and LDCT Identified Pulmonary Nodules UsinG the Percepta NasAL Swab ClassifiEr -- with Familiarization ?  ?Protocol #: [id number] Sponsor: [employer] ?  ?Protocol Revision 1 dated 01Sep2022 and confirmed current on today's visit, IRB approved Revision 1 on 29Dec2022. ?  ?Objectives:  ?Primary: To evaluate if use of the Percepta Nasal Swab test in the diagnostic work up ?of newly identified pulmonary nodules reduces the number of invasive procedures in the ?group classified as low-risk by the test and that are benign as compared to a control ?group managed without a Percepta Nasal Swab test result. ?                  A newly identified nodule is defined as any nodule first identified on imaging ?                  <90 days prior to nasal sample collection that hasn?t undergone a diagnostic ?                   procedure for the management of their index nodule prior to enrollment. ?                  CT imaging includes conventional CT, LDCT, HRCT ?                  Benign diagnosis is defined as a specific diagnosis of a benign condition, ?                   radiographic resolution or stability at ? 24 months, or no cytological, ?                   radiological, or pathological evidence of cancer. ?                  Procedures will be categorized as either invasive or non-invasive in the Data ?                   Management Plan (DMP). ?Secondary: To evaluate if use of the Percepta Nasal Swab test in the diagnostic work ?up of newly identified pulmonary nodules increases the proportion of subjects classified ?as high-risk by the test and have primary lung cancer that go directly to appropriate ?therapy as compared to a control group managed without a Percepta Nasal Swab test ?result. ?                   Proportion of subjects that go directly to appropriate therapy is defined as those ?                    subjects that undergo surgery, ablative  or other appropriate therapy as the next ?                    step after the Percepta Nasal Swab test result without intervening non-surgical ?                    procedures ?                            a. Non-surgical procedures include diagnostic PET, but not PET for ?  staging purposes. ?                            b. Appropriate therapies will be defined in the CRF. ?                   A newly identified nodule is defined as any nodule first identified on imaging ?                   <90 days prior to nasal sample collection. ?                   Lung cancer diagnosis is defined as established by cytology or pathology, or in ?                    circumstances where a presumptive diagnosis of cancer led to definitive ?                    ablative or other appropriate therapy without pathology. ?Key Inclusion Criteria: ? Inclusion Criteria: ? Able to tolerate nasal epithelial specimen collection ? Signed written Informed Consent obtained ? Subject clinical history available for review by sponsor and regulatory agencies ? New nodule first identified on imaging < 90 days prior to nasal sample collection ?(index nodule) ? CT report available for index nodule ? 63 - 25 years of age ? Current or former smoker (>100 cigarettes in a lifetime) ? Pulmonary nodule ?30 mm detected by CT ? ?Key Exclusion Criteria: ?Exclusion Criteria ? Subject has undergone a diagnostic procedure for the management of their ?index nodule after the index CT and prior to enrollment ? Active cancer (other than non-melanoma skin cancer) ? Prior primary lung cancer (prior non-lung cancer acceptable) ? Prior participation in this study (i.e., subjects may not be enrolled more than ?once) ? Current active treatment with an investigational device or drug (patients in trial ?follow up period are okay if intervention phase is complete) ? Patient enrolled or planned to be enrolled in another clinical trial that may ?influence  management of the patient?s nodule ? Concurrent or planned use of tools or tests for assigning lung nodule risk of ?malignancy (e.g., genomic or proteomic blood tests) other than clinically ?validated risk calculators ? ?Clinical Research Coordinator / Research RN note : This visit is for enrollment /baseline Subject [id number] with DOB: 1961-08-13 on 29Mar2023  for the above protocol is an Enrollment Visit and is for purpose of research.  ?  ?Subject expressed interest and consent in continuing as a study subject. Subject confirmed contact information (e.g. address, telephone, email). Subject thanked for participation in research and contribution to science.   ?  ?During this visit on 29Mar2023  , the subject reviewed and signed the consent form, provided demographics, and had a nasal swab collected per the above referenced protocol. Please refer to the subject's paper source binder for further details.  ? ?PI, Dr. Valeta Harms, was present for consenting process. ?  ?Signed by ?Jaye Beagle RN, CRN II ? ?

## 2021-12-30 ENCOUNTER — Telehealth: Payer: Self-pay | Admitting: Orthopaedic Surgery

## 2021-12-30 ENCOUNTER — Ambulatory Visit (HOSPITAL_COMMUNITY)
Admission: RE | Admit: 2021-12-30 | Discharge: 2021-12-30 | Disposition: A | Discharge status: Home / Self Care | Payer: Commercial Managed Care - HMO | Source: Ambulatory Visit | Attending: Acute Care | Admitting: Acute Care

## 2021-12-30 DIAGNOSIS — R911 Solitary pulmonary nodule: Secondary | ICD-10-CM | POA: Insufficient documentation

## 2021-12-30 LAB — GLUCOSE, CAPILLARY: Glucose-Capillary: 112 mg/dL — ABNORMAL HIGH (ref 70–99)

## 2021-12-30 MED ORDER — FLUDEOXYGLUCOSE F - 18 (FDG) INJECTION
9.7000 | Freq: Once | INTRAVENOUS | Status: AC | PRN
  Administered 2021-12-30: 9.53 via INTRAVENOUS

## 2021-12-30 NOTE — Telephone Encounter (Signed)
Called patient left message to return call to schedule an appointment with Dr Erlinda Hong for Bil hand pain ?

## 2022-01-01 ENCOUNTER — Other Ambulatory Visit: Payer: Self-pay | Admitting: Family Medicine

## 2022-01-01 ENCOUNTER — Telehealth: Payer: Self-pay | Admitting: Family Medicine

## 2022-01-01 DIAGNOSIS — Z794 Long term (current) use of insulin: Secondary | ICD-10-CM

## 2022-01-01 MED ORDER — BASAGLAR KWIKPEN 100 UNIT/ML ~~LOC~~ SOPN: 53.0000 [IU] | PEN_INJECTOR | Freq: Every day | SUBCUTANEOUS | 3 refills | Status: DC

## 2022-01-01 NOTE — Telephone Encounter (Signed)
Rx updated & re-sent in.  ?

## 2022-01-01 NOTE — Telephone Encounter (Signed)
Pt is calling and needs new rx basaglar . Pt said md changed the units. ?[facility] [address] [place], [place]. AT [place] Phone:  [phone]  ?Fax:  [phone]  ?  ? ?

## 2022-01-02 ENCOUNTER — Ambulatory Visit (INDEPENDENT_AMBULATORY_CARE_PROVIDER_SITE_OTHER): Payer: Managed Care, Other (non HMO) | Admitting: Gastroenterology

## 2022-01-02 ENCOUNTER — Encounter: Payer: Self-pay | Admitting: Gastroenterology

## 2022-01-02 VITALS — BP 110/66 | HR 70 | Ht 66.0 in | Wt 198.0 lb

## 2022-01-02 DIAGNOSIS — K5010 Crohn's disease of large intestine without complications: Secondary | ICD-10-CM | POA: Diagnosis not present

## 2022-01-02 NOTE — Patient Instructions (Addendum)
If you are age 63 or younger, your body mass index should be between 19-25. Your There is no height or weight on file to calculate BMI. If this is out of the aformentioned range listed, please consider follow up with your Primary Care Provider.  ? ?__________________________________________________________ ? ?The [facility] providers would like to encourage you to use [facility] to communicate with providers for non-urgent requests or questions.  Due to long hold times on the telephone, sending your provider a message by [facility] may be a faster and more efficient way to get a response.  Please allow 48 business hours for a response.  Please remember that this is for non-urgent requests.  ? ?Due to recent changes in healthcare laws, you may see the results of your imaging and laboratory studies on MyChart before your provider has had a chance to review them.  We understand that in some cases there may be results that are confusing or concerning to you. Not all laboratory results come back in the same time frame and the provider may be waiting for multiple results in order to interpret others.  Please give [place] 48 hours in order for your provider to thoroughly review all the results before contacting the office for clarification of your results.  ? ?Please get your blood work done in 3 months. Please go to the lab on the 2nd floor [address].  ? ?Please follow up in 3 months. Give [place] a call at [phone] to schedule an appointment.  ? ?Thank you for choosing me and [facility]. ? ?Gerrit Heck, D.O. ? ? ?We want to thank you for trusting [facility] with your care. All of our staff and providers value the relationships we have built with our patients, and it is an honor to care for you.  ? ?We are writing to let you know that [facility] will close on Feb 03, 2022, and we invite you to continue to see Dr. Carmell Austria and Gerrit Heck at the [facility]  [facility] [place] office location. We are consolidating our serices at these [facility] practices to better provide care. Our office staff will work with you to ensure a seamless transition.  ? ?Gerrit Heck, DO -Dr. Bryan Lemma will be movig to [facility] at [address], [place], [place] [address], effective Feb 03, 2022.  Contact [phone] to schedule an appointment with him.  ? ?Carmell Austria, MD- Dr. Lyndel Safe will be movig to [facility] at [address], [place], [place] [address], effective Feb 03, 2022.  Contact [phone] to schedule an appointment with him.  ? ?Requesting Medical Records ?If you need to request your medical records, please follow the instructions below. Your medical records are confidential, and a copy can be transferred to another provider or released to you or another person you designate only with your permission. ? ?There are several ways to request your medical records: ?Requests for medical records can be submitted through our practice.   ?You can also request your records electronically, in your MyChart account by selecting the ?Request Health Records? tab.  ?If you need additional information on how to request records, please go to [email or web address]/, choose Patient Information, then select Request Medical Records. ?To make an appointment or if you have any questions about your health care needs, please contact our office at [phone] and one of our staff members will be glad to assist you. ?[facility] is committed to providing exceptional care for you  and our community. Thank you for allowing [place] to serve your health care needs. ?Sincerely, ? ?Windy Canny, Director [facility] ?[facility] also offers convenient virtual care options. Sore throat? Sinus problems? Cold or flu symptoms? Get care from the comfort of home with [facility] Video Visits and e-Visits. Learn more about the non-emergency conditions treated and start  your virtual visit at [email or web address] ? ? ?

## 2022-01-02 NOTE — Progress Notes (Signed)
? ?Chief Complaint:    Crohn's Colitis, diarrhea, procedure follow-up ? ?GI History: 61 year old female with a history of diabetes, HTN, CAD s/p CABG, HLD, depression, anxiety, follows in the GI clinic for Crohn's Colitis diagnosed in 2023. ? ?-10/04/2021: Initial GI appointment for evaluation of diarrhea ?-10/31/2021: ESR 39, CRP 20 ?-11/21/2021: Calprotectin 261, normal GI PCR panel, pancreatic elastase ?- 11/26/2021: Colonoscopy: 6 mm polyp removed near AI (path: Benign inflammatory polyp), Transverse colon polyps (path: Benign), Moderate inflammation in the sigmoid, descending, transverse with skip areas and pseudopolyps (path: Chronic, active colitis) With sparing of the rectum and ascending colon.  Normal TI.  Diagnosed with Crohn's Colitis and started on sulfalsalazine ? ?HPI:   ? ? ?Patient is a 61 y.o. female presenting to the Gastroenterology Clinic for follow-up.  ? ?She reports her diarrhea is much improved since starting sulfalazine. Decreased stool frequency to 3-4 BM/day and improved form. Will use imodium on occasion, but decreased use. No nocturnal stools. No EIMs.  No hematochezia or melena. ? ? ?Review of systems:     No chest pain, no SOB, no fevers, no urinary sx  ? ?Past Medical History:  ?Diagnosis Date  ? Anxiety   ? Arthritis   ? "maybe in my left foot" (10/14/2017)  ? Bladder prolapse, female, acquired 09/14/2017  ? Coronary artery disease   ? Depression   ? High cholesterol   ? History of stomach ulcers   ? Hypertension   ? Myocardial infarction ([place]) 12/30/2004  ? Tobacco abuse   ? Type II diabetes mellitus ([place])   ? ? ?Patient's surgical history, family medical history, social history, medications and allergies were all reviewed in Epic  ? ? ?Current Outpatient Medications  ?Medication Sig Dispense Refill  ? acetaminophen (TYLENOL) 500 MG tablet Take 1 tablet (500 mg total) by mouth every 6 (six) hours as needed (pain). 30 tablet 0  ? amLODipine (NORVASC) 5 MG tablet TAKE ONE TABLET BY MOUTH  ONE TIME DAILY 90 tablet 3  ? aspirin 81 MG EC tablet Take 81 mg by mouth daily.    ? buPROPion (WELLBUTRIN XL) 300 MG 24 hr tablet TAKE ONE TABLET BY MOUTH ONE TIME DAILY 90 tablet 2  ? clopidogrel (PLAVIX) 75 MG tablet Take 1 tablet (75 mg total) by mouth daily. 90 tablet 2  ? Continuous Blood Gluc Sensor (FREESTYLE LIBRE 14 DAY SENSOR) MISC APPLY ONE SENSOR TO THE BACK OF YOUR UPPER ARM. REPLACE EVERY 14 DAYS. 1 each 5  ? Dulaglutide (TRULICITY) 1.5 TG/6.2IR SOPN Inject 1.5 mg into the skin once a week. (Patient taking differently: Inject 1.5 mg into the skin every Tuesday.) 0.5 mL 6  ? ezetimibe (ZETIA) 10 MG tablet Take 1 tablet (10 mg total) by mouth daily. 30 tablet 6  ? FARXIGA 5 MG TABS tablet TAKE ONE TABLET BY MOUTH ONE TIME DAILY BEFORE BREAKFAST 90 tablet 3  ? insulin aspart (NOVOLOG FLEXPEN) 100 UNIT/ML FlexPen Inject 5 Units into the skin 3 (three) times daily with meals as needed for high blood sugar.    ? Insulin Glargine (BASAGLAR KWIKPEN) 100 UNIT/ML Inject 53 Units into the skin daily. 45 mL 3  ? Insulin Pen Needle (ULTICARE MICRO PEN NEEDLES) 32G X 4 MM MISC USE WITH INJECTIONS FOUR TIMES A DAY 400 each 3  ? Iron, Ferrous Sulfate, 325 (65 Fe) MG TABS Take 325 mg by mouth daily. 90 tablet 2  ? isosorbide mononitrate (IMDUR) 120 MG 24 hr tablet Take 1 tablet (120  mg total) by mouth daily. 90 tablet 3  ? lisinopril (ZESTRIL) 5 MG tablet TAKE ONE TABLET BY MOUTH ONE TIME DAILY 90 tablet 1  ? loratadine (CLARITIN) 10 MG tablet Take 10 mg by mouth daily.    ? methocarbamol (ROBAXIN) 500 MG tablet Take 1 tablet (500 mg total) by mouth 2 (two) times daily as needed. 20 tablet 0  ? metoprolol tartrate (LOPRESSOR) 50 MG tablet TAKE ONE TABLET BY MOUTH TWICE A DAY 180 tablet 2  ? Multiple Vitamin (MULTIVITAMIN WITH MINERALS) TABS tablet Take 1 tablet by mouth daily.    ? nitroGLYCERIN (NITROSTAT) 0.4 MG SL tablet Place 0.4 mg under the tongue every 5 (five) minutes as needed for chest pain.    ? Omega-3  Fatty Acids (FISH OIL) 1200 MG CAPS Take 1,200 mg by mouth daily.    ? oxyCODONE (OXY IR/ROXICODONE) 5 MG immediate release tablet Take 1 tablet (5 mg total) by mouth every 4 (four) hours as needed for severe pain. 15 tablet 0  ? polyethylene glycol powder (GLYCOLAX/MIRALAX) 17 GM/SCOOP powder Take 17 g by mouth daily. Drink 17g (1 scoop) dissolved in water per day. 255 g 0  ? rosuvastatin (CRESTOR) 40 MG tablet TAKE ONE TABLET BY MOUTH ONE TIME DAILY 90 tablet 3  ? sertraline (ZOLOFT) 100 MG tablet TAKE TWO TABLETS BY MOUTH ONE TIME DAILY 180 tablet 2  ? sulfaSALAzine (AZULFIDINE) 500 MG EC tablet Take 3 tablets (1,500 mg total) by mouth 2 (two) times daily. For 8 weeks 336 tablet 0  ? Turmeric 500 MG CAPS Take 1,000 mg by mouth daily.    ? sulfaSALAzine (AZULFIDINE) 500 MG EC tablet Take 2 tablets (1,000 mg total) by mouth 2 (two) times daily. (Patient not taking: Reported on 01/02/2022) 120 tablet 11  ? ?No current facility-administered medications for this visit.  ? ? ?Physical Exam:   ? ? ?BP 110/66   Pulse 70   Ht 5' 6"  (1.676 m)   Wt 198 lb (89.8 kg)   BMI 31.96 kg/m?  ? ?GENERAL:  Pleasant female in NAD ?PSYCH: : Cooperative, normal affect ?SKIN:  turgor, no lesions seen ?Musculoskeletal:  Normal muscle tone, normal strength ?NEURO: Alert and oriented x 3, no focal neurologic deficits ? ? ?IMPRESSION and PLAN:   ? ?1) Crohn's Colitis ?- Newly diagnosed moderate Crohn's Colitis, with good clinical response since starting sulfasalazine 2 gm BID last month ?- Check ESR, CRP, fecal calprotectin in 3 months as a surrogate marker of response to therapy ?- Continue serial BMP checks ?- Briefly discussed role/utility of escalating therapy with thiopurine's or biologic agents depending on response to sulfasalazine ?- Tentative plan for repeat colonoscopy in 11/2022 to evaluate for deep remission ?- RTC 3 months or sooner as needed ?- Plan to discuss micronutrient evaluation, IBD health maintenance, vaccination,  etc. at follow-up ? ? ?   ? ?Lavena Bullion ,DO, FACG 01/02/2022, 11:07 AM ? ?

## 2022-01-03 ENCOUNTER — Other Ambulatory Visit: Payer: Self-pay

## 2022-01-03 DIAGNOSIS — E118 Type 2 diabetes mellitus with unspecified complications: Secondary | ICD-10-CM

## 2022-01-03 MED ORDER — TRULICITY 1.5 MG/0.5ML ~~LOC~~ SOAJ: 1.5000 mg | SUBCUTANEOUS | 6 refills | Status: DC

## 2022-01-03 MED ORDER — DAPAGLIFLOZIN PROPANEDIOL 5 MG PO TABS: ORAL_TABLET | ORAL | 3 refills | Status: DC

## 2022-01-06 ENCOUNTER — Telehealth (INDEPENDENT_AMBULATORY_CARE_PROVIDER_SITE_OTHER): Payer: Managed Care, Other (non HMO) | Admitting: Pulmonary Disease

## 2022-01-06 DIAGNOSIS — F17210 Nicotine dependence, cigarettes, uncomplicated: Secondary | ICD-10-CM | POA: Diagnosis not present

## 2022-01-06 DIAGNOSIS — R911 Solitary pulmonary nodule: Secondary | ICD-10-CM | POA: Diagnosis not present

## 2022-01-06 NOTE — Progress Notes (Signed)
Virtual Visit via Telephone Note ? ?I connected with Victoria Eaton on 01/06/22 at 10:15 AM EDT by telephone and verified that I am speaking with the correct person using two identifiers. ? ?Location: ?Patient: Home ?Provider: Office ?  ?I discussed the limitations, risks, security and privacy concerns of performing an evaluation and management service by telephone and the availability of in person appointments. I also discussed with the patient that there may be a patient responsible charge related to this service. The patient expressed understanding and agreed to proceed. ? ?History of Present Illness: ? ?This is a 61 year old female, referred from our lung cancer screening program.  She was initially seen on 11/22/2021 and completed her first lung cancer screening CT.  This revealed a left upper lobe 10 mm nodule.  She was then referred for a nuclear medicine PET scan and set up for a consultative appointment with me.  She was unable to make the appointment today in person due to feeling sick.  Over the past couple of days she been feeling under the weather and felt like it would be better to switch to a virtual visit.  She does feel like she is improving.  Today via video we were able to discuss her CT imagingAs well as her nuclear medicine pet imaging.  The nuclear medicine PET scan revealed no significant uptake within the nodule.  And we discussed this today in detail about the probability of developing lung cancer with a negative PET scan nodule. ?  ?Observations/Objective: ?No acute distress, able to speak in complete sentences ? ?Assessment and Plan: ? ?Left upper lobe pulmonary nodule, abnormal lung cancer screening CT, negative PET uptake ?Tobacco abuse ? ?Plan: ?We reviewed her nuclear medicine pet imaging as well as her lung cancer screening CT. ?I think it probably best that she return to the lung cancer screening series with a repeat CT scan in 1 year. ?The probability of malignancy at a 10 mm lesion  that has no metabolic uptake is low.  I did explain that I did not exclude the fact that this could be a start of a primary lung cancer. ?She would like to have a repeat noncontrasted CT scan of the chest in 1 year. ?This can continue in our lung cancer screening program. ?I will message our screening coordinators to help ensure that we have a repeat CT completed. ?We also used the solitary pulmonary nodule risk calculator from [facility] to discuss probabilities. ? ?Additional time spent coordinating follow-up with lung cancer screening program. ? ? ?Follow Up Instructions: ? ?I discussed the assessment and treatment plan with the patient. The patient was provided an opportunity to ask questions and all were answered. The patient agreed with the plan and demonstrated an understanding of the instructions. ?  ?The patient was advised to call back or seek an in-person evaluation if the symptoms worsen or if the condition fails to improve as anticipated. ? ?I provided 45 minutes of non-face-to-face time during this encounter. ? ? ?Garner Nash, DO ? ?

## 2022-01-08 ENCOUNTER — Other Ambulatory Visit: Payer: Self-pay

## 2022-01-08 DIAGNOSIS — F17210 Nicotine dependence, cigarettes, uncomplicated: Secondary | ICD-10-CM

## 2022-01-08 DIAGNOSIS — Z87891 Personal history of nicotine dependence: Secondary | ICD-10-CM

## 2022-01-08 DIAGNOSIS — Z122 Encounter for screening for malignant neoplasm of respiratory organs: Secondary | ICD-10-CM

## 2022-01-16 ENCOUNTER — Other Ambulatory Visit: Payer: Self-pay | Admitting: Family Medicine

## 2022-01-16 DIAGNOSIS — I10 Essential (primary) hypertension: Secondary | ICD-10-CM

## 2022-01-16 DIAGNOSIS — E118 Type 2 diabetes mellitus with unspecified complications: Secondary | ICD-10-CM

## 2022-01-21 ENCOUNTER — Telehealth: Payer: Self-pay

## 2022-01-21 DIAGNOSIS — K5010 Crohn's disease of large intestine without complications: Secondary | ICD-10-CM

## 2022-01-21 NOTE — Telephone Encounter (Signed)
-----   Message from Marice Potter, RN sent at 12/17/2021  9:19 AM EDT ----- ?Regarding: BMP ?Pt needs BMP, need to place order.  ? ?

## 2022-01-21 NOTE — Telephone Encounter (Signed)
Left voicemail for pt to call back.

## 2022-01-22 NOTE — Telephone Encounter (Signed)
Left message for pt to call back  °

## 2022-01-23 NOTE — Addendum Note (Signed)
Addended by: Berniece Salines A on: 01/23/2022 01:25 PM ? ? Modules accepted: Orders ? ?

## 2022-01-23 NOTE — Telephone Encounter (Signed)
Called pt to notify her of repeat bmp. Pt verbalized understanding and knows to come to [place] office.  ?

## 2022-01-31 ENCOUNTER — Encounter (INDEPENDENT_AMBULATORY_CARE_PROVIDER_SITE_OTHER): Payer: Commercial Managed Care - HMO | Admitting: Family Medicine

## 2022-01-31 DIAGNOSIS — R5383 Other fatigue: Secondary | ICD-10-CM

## 2022-02-07 NOTE — Telephone Encounter (Signed)
Please see the MyChart message reply(ies) for my assessment and plan. 1. Fatigue, unspecified type  The patient gave consent for this Medical Advice Message and is aware that it may result in a bill to their insurance company as well as the possibility that this may result in a co-payment or deductible. They are an established patient, but are not seeking medical advice exclusively about a problem treated during an in person or video visit in the last 7 days. I did not recommend an in person or video visit within 7 days of my reply.  I spent a total of 10 minutes cumulative time within 7 days through MyChart messaging Victoria Veiga [place], MD \

## 2022-02-10 ENCOUNTER — Ambulatory Visit: Payer: Commercial Managed Care - HMO | Admitting: Podiatrist

## 2022-02-10 ENCOUNTER — Encounter: Payer: Self-pay | Admitting: Podiatrist

## 2022-02-10 ENCOUNTER — Ambulatory Visit (INDEPENDENT_AMBULATORY_CARE_PROVIDER_SITE_OTHER): Payer: Commercial Managed Care - HMO

## 2022-02-10 DIAGNOSIS — S92514A Nondisplaced fracture of proximal phalanx of right lesser toe(s), initial encounter for closed fracture: Secondary | ICD-10-CM | POA: Diagnosis not present

## 2022-02-10 DIAGNOSIS — M7662 Achilles tendinitis, left leg: Secondary | ICD-10-CM | POA: Diagnosis not present

## 2022-02-10 DIAGNOSIS — M7661 Achilles tendinitis, right leg: Secondary | ICD-10-CM

## 2022-02-10 NOTE — Progress Notes (Signed)
HPI: Patient is 61 y.o. female who presents today for concern that she fractured her left third toe.  She also relates some pain in the back of the heels.  She does not like enclosed shoes and prefers to wear birkenstock sandals when wearing shoes.    Patient Active Problem List   Diagnosis Date Noted   Coronary artery disease of bypass graft of native heart with stable angina pectoris ([place]) 12/19/2021   Chronic diarrhea 09/06/2021   Carpal tunnel syndrome on right    Trigger finger, right middle finger    Complete uterine prolapse 03/09/2021   Non-ST elevation (NSTEMI) myocardial infarction ([place]) 02/17/2018   Type 2 diabetes mellitus with complication, with long-term current use of insulin ([place]) 02/17/2018   Chest pain 01/01/2018   Unstable angina ([place]) 01/01/2018   Arm contusion, left, initial encounter 11/25/2017   Contusion of right knee 11/25/2017   Hx of CABG 10/19/2017   Epigastric pain    Pancreatitis 10/10/2017   Hyperlipidemia LDL goal <70 09/14/2017   Essential hypertension 05/01/2017   Major depression, recurrent ([place]) 05/01/2017   Tobacco abuse 05/01/2017   History of MI (myocardial infarction) 05/01/2017   Allergic rhinitis 05/01/2017   History of arthritis 05/01/2017   Anxiety disorder, unspecified 05/01/2017   Bladder prolapse, female, acquired 11/09/2013    Current Outpatient Medications on File Prior to Visit  Medication Sig Dispense Refill   acetaminophen (TYLENOL) 500 MG tablet Take 1 tablet (500 mg total) by mouth every 6 (six) hours as needed (pain). 30 tablet 0   amLODipine (NORVASC) 5 MG tablet TAKE ONE TABLET BY MOUTH ONE TIME DAILY 90 tablet 3   aspirin 81 MG EC tablet Take 81 mg by mouth daily.     buPROPion (WELLBUTRIN XL) 300 MG 24 hr tablet TAKE ONE TABLET BY MOUTH ONE TIME DAILY 90 tablet 2   clopidogrel (PLAVIX) 75 MG tablet Take 1 tablet (75 mg total) by mouth daily. 90 tablet 2   Continuous Blood Gluc Sensor (FREESTYLE LIBRE 14 DAY SENSOR)  MISC APPLY ONE SENSOR TO THE BACK OF YOUR UPPER ARM. REPLACE EVERY 14 DAYS. 3 each 5   dapagliflozin propanediol (FARXIGA) 5 MG TABS tablet TAKE ONE TABLET BY MOUTH ONE TIME DAILY BEFORE BREAKFAST 90 tablet 3   Dulaglutide (TRULICITY) 1.5 IP/3.8SN SOPN Inject 1.5 mg into the skin once a week. 3 mL 6   ezetimibe (ZETIA) 10 MG tablet Take 1 tablet (10 mg total) by mouth daily. 30 tablet 6   insulin aspart (NOVOLOG FLEXPEN) 100 UNIT/ML FlexPen Inject 5 Units into the skin 3 (three) times daily with meals as needed for high blood sugar.     Insulin Glargine (BASAGLAR KWIKPEN) 100 UNIT/ML Inject 53 Units into the skin daily. 45 mL 3   Insulin Pen Needle (ULTICARE MICRO PEN NEEDLES) 32G X 4 MM MISC USE WITH INJECTIONS FOUR TIMES A DAY 400 each 3   Iron, Ferrous Sulfate, 325 (65 Fe) MG TABS Take 325 mg by mouth daily. 90 tablet 2   isosorbide mononitrate (IMDUR) 120 MG 24 hr tablet Take 1 tablet (120 mg total) by mouth daily. 90 tablet 3   lisinopril (ZESTRIL) 5 MG tablet TAKE ONE TABLET BY MOUTH ONE TIME DAILY 90 tablet 1   loratadine (CLARITIN) 10 MG tablet Take 10 mg by mouth daily.     methocarbamol (ROBAXIN) 500 MG tablet Take 1 tablet (500 mg total) by mouth 2 (two) times daily as needed. 20 tablet 0  metoprolol tartrate (LOPRESSOR) 50 MG tablet TAKE ONE TABLET BY MOUTH TWICE A DAY 180 tablet 2   Multiple Vitamin (MULTIVITAMIN WITH MINERALS) TABS tablet Take 1 tablet by mouth daily.     nitroGLYCERIN (NITROSTAT) 0.4 MG SL tablet Place 0.4 mg under the tongue every 5 (five) minutes as needed for chest pain.     Omega-3 Fatty Acids (FISH OIL) 1200 MG CAPS Take 1,200 mg by mouth daily.     oxyCODONE (OXY IR/ROXICODONE) 5 MG immediate release tablet Take 1 tablet (5 mg total) by mouth every 4 (four) hours as needed for severe pain. 15 tablet 0   polyethylene glycol powder (GLYCOLAX/MIRALAX) 17 GM/SCOOP powder Take 17 g by mouth daily. Drink 17g (1 scoop) dissolved in water per day. 255 g 0    rosuvastatin (CRESTOR) 40 MG tablet TAKE ONE TABLET BY MOUTH ONE TIME DAILY 90 tablet 3   sertraline (ZOLOFT) 100 MG tablet TAKE TWO TABLETS BY MOUTH ONE TIME DAILY 180 tablet 2   sulfaSALAzine (AZULFIDINE) 500 MG EC tablet Take 3 tablets (1,500 mg total) by mouth 2 (two) times daily. For 8 weeks 336 tablet 0   sulfaSALAzine (AZULFIDINE) 500 MG EC tablet Take 2 tablets (1,000 mg total) by mouth 2 (two) times daily. (Patient not taking: Reported on 01/02/2022) 120 tablet 11   Turmeric 500 MG CAPS Take 1,000 mg by mouth daily.     No current facility-administered medications on file prior to visit.    Allergies  Allergen Reactions   Januvia [Sitagliptin] Nausea And Vomiting and Other (See Comments)    Pancreatitis (this was in Epic, but patient was unaware of this)    Metformin And Related Diarrhea   Penicillins Other (See Comments)    Unknown childhood reaction Has patient had a PCN reaction causing immediate rash, facial/tongue/throat swelling, SOB or lightheadedness with hypotension: Unknown Has patient had a PCN reaction causing severe rash involving mucus membranes or skin necrosis: Unknown Has patient had a PCN reaction that required hospitalization: Unknown Has patient had a PCN reaction occurring within the last 10 years: No If all of the above answers are "NO", then may proceed with Cephalosporin use.     Review of Systems No fevers, chills, nausea, muscle aches, no difficulty breathing, no calf pain, no chest pain or shortness of breath.   Physical Exam  GENERAL APPEARANCE: Alert, conversant. Appropriately groomed. No acute distress.   VASCULAR: Pedal pulses palpable DP and PT bilateral.  Capillary refill time is immediate to all digits,  Proximal to distal cooling it warm to warm.  Digital perfusion adequate.   NEUROLOGIC: sensation is intact to 5.07 monofilament at 5/5 sites bilateral.  Light touch is intact bilateral, vibratory sensation intact  bilateral  MUSCULOSKELETAL: acceptable muscle strength, tone and stability bilateral. Pain at the base of the left third toe is noted. No ecchymosis or swelling is noted.  Slight gap between toes 2 and 3 noted clinically. High arch foot type is noted. Pain at the back of the heels at the insertion of the achilles tendon is noted bilateral.   DERMATOLOGIC: skin is warm, supple, and dry.  Color, texture, and turgor of skin within normal limits.  No open wounds are noted.  No preulcerative lesions are seen.  Digital nails are asymptomatic.    Xray:  left foot xray shows a fracture of the base of the proximal phalanx medially of the left third toe.  Fracture fragment is small and non displaced.  Increased calcaneal inclination angle  noted.  Small posterior calcaneal spur is present.   Right foot xray shows an old healed fracture of the proximal phalanx of the right second toe and base of the fourth metatarsal.  High arch foot type and small posterior calcaneal spur is present.   Assessment     ICD-10-CM   1. Closed nondisplaced fracture of proximal phalanx of lesser toe of right foot, initial encounter  S92.514A DG Foot Complete Right    DG Foot Complete Left    2. Achilles tendonitis, bilateral  M76.61    M76.62        Plan  Discussed exam and xray findings with the patient. Discussed shoegear changes and heel lifts were dispensed for her to try.  Also recommended clog type of open back shoes and stretching exercises were dispensed.  Recommended taping the second and third toes together and wearing a stiff shoe for the next 4-6 weeks for the toe fracture fragment.  She will also try voltaren gel on the posterior heels as she is unable to take oral antiinflammatory medication.

## 2022-02-10 NOTE — Patient Instructions (Signed)
Achilles Tendinitis Rehab Ask your health care provider which exercises are safe for you. Do exercises exactly as told by your health care provider and adjust them as directed. It is normal to feel mild stretching, pulling, tightness, or discomfort as you do these exercises. Stop right away if you feel sudden pain or your pain gets worse. Do not begin these exercises until told by your health care provider. Stretching and range-of-motion exercises These exercises warm up your muscles and joints and improve the movement and flexibility of your ankle. These exercises also help to relieve pain. Standing wall calf stretch with straight knee  Stand with your hands against a wall. Extend your left / right leg behind you, and bend your front knee slightly. Keep both of your heels on the floor. Point the toes of your back foot slightly inward. Keeping your heels on the floor and your back knee straight, shift your weight toward the wall. Do not allow your back to arch. You should feel a gentle stretch in your upper calf. Hold this position for __________ seconds. Repeat __________ times. Complete this exercise __________ times a day. Standing wall calf stretch with bent knee Stand with your hands against a wall. Extend your left / right leg behind you, and bend your front knee slightly. Keep both of your heels on the floor. Point the toes of your back foot slightly inward. Keeping your heels on the floor, bend your back knee slightly. You should feel a gentle stretch deep in your lower calf near your heel. Hold this position for __________ seconds. Repeat __________ times. Complete this exercise __________ times a day. Strengthening exercises These exercises build strength and control of your ankle. Endurance is the ability to use your muscles for a long time, even after they get tired. Plantar flexion with band In this exercise, you push your toes downward, away from you, with an exercise band  providing resistance. Sit on the floor with your left / right leg extended. You may put a pillow under your calf to give your foot more room to move. Loop a rubber exercise band or tube around the ball of your left / right foot. The ball of your foot is on the walking surface, right under your toes. The band or tube should be slightly tense when your foot is relaxed. If the band or tube slips, you can put on your shoe or put a washcloth between the band and your foot to help it stay in place. Slowly point your toes downward, pushing them away from you (plantar flexion). Hold this position for __________ seconds. Slowly release the tension in the band or tube, controlling smoothly until your foot is back to the starting position. Repeat steps 1-5 with your left / right leg. Repeat __________ times. Complete this exercise __________ times a day. Eccentric heel drop  In this exercise, you stand and slowly raise your heel and then slowly lower it. This exercise lengthens the calf muscles (eccentric) while the heel bears weight. If this exercise is too easy, try doing it while wearing a backpack with weights in it. Stand on a step with the balls of your feet. The ball of your foot is on the walking surface, right under your toes. Do not put your heels on the step. For balance, rest your hands on the wall or on a railing. Rise up onto the balls of your feet. Keeping your heels up, shift all of your weight to your left / right leg  and pick up your other leg. Slowly lower your left / right leg so your heel drops below the level of the step. Put down your other foot before returning to the start position. If told by your health care provider, build up to: 3 sets of 15 repetitions while keeping your knees straight. 3 sets of 15 repetitions while keeping your knees slightly bent as far as told by your health care provider. Repeat __________ times. Complete this exercise __________ times a day. Balance  exercises These exercises improve or maintain your balance. Balance is important in preventing falls. Single leg stand If this exercise is too easy, you can try it with your eyes closed or while standing on a pillow. Without shoes, stand near a railing or in a door frame. Hold on to the railing or door frame as needed. Stand on your left / right foot. Keep your big toe down on the floor and try to keep your arch lifted. Hold this position for __________ seconds. Repeat __________ times. Complete this exercise __________ times a day. This information is not intended to replace advice given to you by your health care provider. Make sure you discuss any questions you have with your health care provider. Document Revised: 10/30/2020 Document Reviewed: 10/30/2020 Elsevier Patient Education  [place].

## 2022-02-19 ENCOUNTER — Other Ambulatory Visit: Payer: Self-pay | Admitting: Family Medicine

## 2022-02-19 DIAGNOSIS — I10 Essential (primary) hypertension: Secondary | ICD-10-CM

## 2022-03-05 ENCOUNTER — Other Ambulatory Visit: Payer: Self-pay | Admitting: Internal Medicine

## 2022-03-05 ENCOUNTER — Other Ambulatory Visit: Payer: Self-pay | Admitting: Nurse Practitioner

## 2022-03-05 ENCOUNTER — Other Ambulatory Visit: Payer: Self-pay | Admitting: Family Medicine

## 2022-03-05 DIAGNOSIS — F3341 Major depressive disorder, recurrent, in partial remission: Secondary | ICD-10-CM

## 2022-03-05 DIAGNOSIS — D509 Iron deficiency anemia, unspecified: Secondary | ICD-10-CM

## 2022-03-05 DIAGNOSIS — F32A Depression, unspecified: Secondary | ICD-10-CM

## 2022-03-07 ENCOUNTER — Other Ambulatory Visit: Payer: Self-pay | Admitting: Internal Medicine

## 2022-03-10 ENCOUNTER — Encounter: Payer: Self-pay | Admitting: Family Medicine

## 2022-03-10 DIAGNOSIS — R5383 Other fatigue: Secondary | ICD-10-CM

## 2022-03-20 LAB — HM DIABETES EYE EXAM

## 2022-03-26 ENCOUNTER — Ambulatory Visit (INDEPENDENT_AMBULATORY_CARE_PROVIDER_SITE_OTHER): Payer: Commercial Managed Care - HMO

## 2022-03-26 ENCOUNTER — Encounter: Payer: Self-pay | Admitting: Orthopaedic Surgery

## 2022-03-26 ENCOUNTER — Ambulatory Visit: Payer: Commercial Managed Care - HMO | Admitting: Orthopaedic Surgery

## 2022-03-26 VITALS — Ht 66.0 in | Wt 194.0 lb

## 2022-03-26 DIAGNOSIS — M545 Low back pain, unspecified: Secondary | ICD-10-CM | POA: Diagnosis not present

## 2022-03-26 DIAGNOSIS — G8929 Other chronic pain: Secondary | ICD-10-CM

## 2022-03-26 DIAGNOSIS — M79644 Pain in right finger(s): Secondary | ICD-10-CM | POA: Diagnosis not present

## 2022-03-26 MED ORDER — PREDNISONE 10 MG (21) PO TBPK: ORAL_TABLET | ORAL | 3 refills | Status: DC

## 2022-03-26 NOTE — Progress Notes (Signed)
Office Visit Note   [name]           Date of Birth: 03-May-1961           MRN: [id number] Visit Date: 03/26/2022              Requested by: Martinique, Betty G, MD [address] [place],   [id number] PCP: Martinique, Betty G, MD   Assessment & Plan: Visit Diagnoses:  1. Chronic midline low back pain, unspecified whether sciatica present   2. Pain of right thumb     Plan: Impression is chronic, low back pain and right trigger thumb and CMC arthritis.  Conditions reviewed and treatment options were discussed.  She has had prior cortisone injections for trigger fingers which did not provide prolonged relief therefore not interested at this time.  Would rather try prednisone Dosepak and physical therapy for the back.  We will wear a CMC support brace in the meantime.  Follow-up as needed.  Follow-Up Instructions: No follow-ups on file.   Orders:  Orders Placed This Encounter  Procedures   XR Hand Complete Right   XR Lumbar Spine 2-3 Views   Ambulatory referral to Physical Therapy   Meds ordered this encounter  Medications   predniSONE (STERAPRED UNI-PAK 21 TAB) 10 MG (21) TBPK tablet    Sig: Take as directed    Dispense:  21 tablet    Refill:  3      Procedures: No procedures performed   Clinical Data: No additional findings.   Subjective: Chief Complaint  Patient presents with   Right Hand - Pain   Lower Back - Pain    HPI Victoria Eaton comes in today for right thumb pain and low back pain with radiation of pain down her legs.  Occasional bilateral lateral hip pain as well as groin pain.  Denies any red flag symptoms.  Experiencing triggering in the right thumb as well.  She is diabetic and on Plavix.  Underwent a right carpal tunnel surgery and right long trigger finger release about a year ago.  She has done well from this.  Review of Systems  Constitutional: Negative.   HENT: Negative.    Eyes: Negative.   Respiratory: Negative.     Cardiovascular: Negative.   Endocrine: Negative.   Musculoskeletal: Negative.   Neurological: Negative.   Hematological: Negative.   Psychiatric/Behavioral: Negative.    All other systems reviewed and are negative.    Objective: Vital Signs: Ht 5' 6"  (1.676 m)   Wt 194 lb (88 kg)   BMI 31.31 kg/m   Physical Exam Vitals and nursing note reviewed.  Constitutional:      Appearance: She is well-developed.  Pulmonary:     Effort: Pulmonary effort is normal.  Skin:    General: Skin is warm.     Capillary Refill: Capillary refill takes less than 2 seconds.  Neurological:     Mental Status: She is alert and oriented to person, place, and time.  Psychiatric:        Behavior: Behavior normal.        Thought Content: Thought content normal.        Judgment: Judgment normal.     Ortho Exam Examination of the lumbar spine shows tenderness diffusely.  Good hip range of motion.  Slight tenderness to the lateral aspects of both hips.  Strength is intact. Examination of the right thumb shows pain without crepitus with grind test.  There is triggering as well.  Specialty Comments:  No specialty comments available.  Imaging: XR Hand Complete Right  Result Date: 03/26/2022 Mild thumb CMC osteoarthritis.  No acute abnormalities.  XR Lumbar Spine 2-3 Views  Result Date: 03/26/2022 Diffusely degenerative facet joints.  No acute abnormalities.    PMFS History: Patient Active Problem List   Diagnosis Date Noted   Coronary artery disease of bypass graft of native heart with stable angina pectoris ([place]) 12/19/2021   Chronic diarrhea 09/06/2021   Carpal tunnel syndrome on right    Trigger finger, right middle finger    Complete uterine prolapse 03/09/2021   Non-ST elevation (NSTEMI) myocardial infarction ([place]) 02/17/2018   Type 2 diabetes mellitus with complication, with long-term current use of insulin ([place]) 02/17/2018   Chest pain 01/01/2018   Unstable angina (H[facility]) 01/01/2018   Arm  contusion, left, initial encounter 11/25/2017   Contusion of right knee 11/25/2017   Hx of CABG 10/19/2017   Epigastric pain    Pancreatitis 10/10/2017   Hyperlipidemia LDL goal <70 09/14/2017   Essential hypertension 05/01/2017   Major depression, recurrent ([place]) 05/01/2017   Tobacco abuse 05/01/2017   History of MI (myocardial infarction) 05/01/2017   Allergic rhinitis 05/01/2017   History of arthritis 05/01/2017   Anxiety disorder, unspecified 05/01/2017   Bladder prolapse, female, acquired 11/09/2013   Past Medical History:  Diagnosis Date   Anxiety    Arthritis    "maybe in my left foot" (10/14/2017)   Bladder prolapse, female, acquired 09/14/2017   Coronary artery disease    Depression    High cholesterol    History of stomach ulcers    Hypertension    Myocardial infarction ([place]) 12/30/2004   Tobacco abuse    Type II diabetes mellitus ([place])     Family History  Problem Relation Age of Onset   Heart disease Father    Hyperlipidemia Father    Hypertension Father    Colon cancer Neg Hx    Esophageal cancer Neg Hx    Stomach cancer Neg Hx    Rectal cancer Neg Hx     Past Surgical History:  Procedure Laterality Date   ANTERIOR AND POSTERIOR REPAIR N/A 07/09/2021   Procedure: ANTERIOR (CYSTOCELE) AND POSTERIOR REPAIR (RECTOCELE) with perineorrhaphy;  Surgeon: [name], MD;  [facility]: [facility];  Service: Gynecology;  Laterality: N/A;   CARDIAC CATHETERIZATION  10/14/2017   CARPAL TUNNEL RELEASE Right 04/10/2021   Procedure: RIGHT CARPAL TUNNEL RELEASE;  Surgeon: Leandrew Koyanagi, MD;  Location: [facility];  Service: Orthopedics;  Laterality: Right;   CORONARY ANGIOPLASTY WITH STENT PLACEMENT  12/30/2004   Patient reported   CORONARY ARTERY BYPASS GRAFT N/A 10/19/2017   Procedure: CORONARY ARTERY BYPASS GRAFTING (CABG) times four using the right saphaneous vein. Harvested endoscopicly and left internal mammary artery.;  Surgeon: Grace Isaac, MD;  Location: [facility];   Service: Open Heart Surgery;  Laterality: N/A;   CYSTOSCOPY N/A 07/09/2021   Procedure: CYSTOSCOPY;  Surgeon: [name], MD;  [facility];  Service: Gynecology;  Laterality: N/A;   DILATION AND CURETTAGE OF UTERUS  1980s   LEFT HEART CATH AND CORONARY ANGIOGRAPHY N/A 10/14/2017   Procedure: LEFT HEART CATH AND CORONARY ANGIOGRAPHY;  Surgeon: Martinique, Peter M, MD;  Location: [facility];  Service: Cardiovascular;  Laterality: N/A;   LEFT HEART CATH AND CORS/GRAFTS ANGIOGRAPHY N/A 01/01/2018   Procedure: LEFT HEART CATH AND CORS/GRAFTS ANGIOGRAPHY;  Surgeon: Jettie Booze, MD;  Location: [facility];  Service: Cardiovascular;  Laterality: N/A;   LEFT HEART CATH AND CORS/GRAFTS ANGIOGRAPHY N/A 08/16/2018   Procedure: LEFT HEART CATH AND CORS/GRAFTS ANGIOGRAPHY;  Surgeon: Nelva Bush, MD;  Location: [facility];  Service: Cardiovascular;  Laterality: N/A;   PILONIDAL CYST EXCISION  1980s   TEE WITHOUT CARDIOVERSION N/A 10/19/2017   Procedure: TRANSESOPHAGEAL ECHOCARDIOGRAM (TEE);  Surgeon: Grace Isaac, MD;  Location: [facility];  Service: Open Heart Surgery;  Laterality: N/A;   TRIGGER FINGER RELEASE Right 04/10/2021   Procedure: RIGHT LONG FINGER TRIGGER RELEASE;  Surgeon: Leandrew Koyanagi, MD;  Location: [facility];  Service: Orthopedics;  Laterality: Right;   VAGINAL HYSTERECTOMY N/A 07/09/2021   Procedure: HYSTERECTOMY VAGINAL;  Surgeon: [name], MD;  [facility];  Service: Gynecology;  Laterality: N/A;  total time requested for all procedures is 3 hours   VAGINAL PROLAPSE REPAIR N/A 07/09/2021   Procedure: VAGINAL VAULT SUSPENSION;  Surgeon: [name], MD;  [facility];  Service: Gynecology;  Laterality: N/A;   Social History   Occupational History   Not on file  Tobacco Use   Smoking status: Some Days    Packs/day: 0.75    Years: 45.00    Total pack years: 33.75    Types: Cigarettes    Start date: 05/01/1972     Last attempt to quit: 09/30/2017    Years since quitting: 4.4   Smokeless tobacco: Never  Vaping Use   Vaping Use: Never used  Substance and Sexual Activity   Alcohol use: Yes    Alcohol/week: 7.0 standard drinks of alcohol    Types: 7 Standard drinks or equivalent per week    Comment: daily: 2-3 wine   Drug use: No   Sexual activity: Not Currently

## 2022-04-01 ENCOUNTER — Ambulatory Visit: Payer: Commercial Managed Care - HMO | Attending: Orthopaedic Surgery

## 2022-04-01 ENCOUNTER — Telehealth: Payer: Self-pay

## 2022-04-01 ENCOUNTER — Other Ambulatory Visit: Payer: Self-pay

## 2022-04-01 DIAGNOSIS — G8929 Other chronic pain: Secondary | ICD-10-CM | POA: Insufficient documentation

## 2022-04-01 DIAGNOSIS — R2689 Other abnormalities of gait and mobility: Secondary | ICD-10-CM | POA: Diagnosis present

## 2022-04-01 DIAGNOSIS — M79644 Pain in right finger(s): Secondary | ICD-10-CM | POA: Insufficient documentation

## 2022-04-01 DIAGNOSIS — M5459 Other low back pain: Secondary | ICD-10-CM | POA: Insufficient documentation

## 2022-04-01 DIAGNOSIS — K5010 Crohn's disease of large intestine without complications: Secondary | ICD-10-CM

## 2022-04-01 DIAGNOSIS — M545 Low back pain, unspecified: Secondary | ICD-10-CM | POA: Insufficient documentation

## 2022-04-01 DIAGNOSIS — M25551 Pain in right hip: Secondary | ICD-10-CM | POA: Insufficient documentation

## 2022-04-01 DIAGNOSIS — M6281 Muscle weakness (generalized): Secondary | ICD-10-CM | POA: Insufficient documentation

## 2022-04-01 DIAGNOSIS — M25552 Pain in left hip: Secondary | ICD-10-CM | POA: Diagnosis present

## 2022-04-01 NOTE — Telephone Encounter (Signed)
-----  Message from Marice Potter, RN sent at 03/27/2022 10:15 AM EDT ----- Regarding: FW: labs  ----- Message ----- From: Marice Potter, RN Sent: 03/27/2022  12:00 AM EDT To: Marice Potter, RN Subject: labs                                           Pt needs CRP, fecal calprotectin, and ESR on July 13th. See 4/13 office visit.

## 2022-04-01 NOTE — Therapy (Signed)
OUTPATIENT PHYSICAL THERAPY THORACOLUMBAR EVALUATION   Patient Name: Victoria Eaton MRN: [id number] DOB:[birth year], 61 y.o., female Today's Date: 04/01/2022   PT End of Session - 04/01/22 1146     Visit Number 1    Date for PT Re-Evaluation 06/03/22    PT Start Time 1145    PT Stop Time 1225    PT Time Calculation (min) 40 min    Activity Tolerance Patient tolerated treatment well    Behavior During Therapy [facility] for tasks assessed/performed             Past Medical History:  Diagnosis Date   Anxiety    Arthritis    "maybe in my left foot" (10/14/2017)   Bladder prolapse, female, acquired 09/14/2017   Coronary artery disease    Depression    High cholesterol    History of stomach ulcers    Hypertension    Myocardial infarction ([place]) 12/30/2004   Tobacco abuse    Type II diabetes mellitus ([place])    Past Surgical History:  Procedure Laterality Date   ANTERIOR AND POSTERIOR REPAIR N/A 07/09/2021   Procedure: ANTERIOR (CYSTOCELE) AND POSTERIOR REPAIR (RECTOCELE) with perineorrhaphy;  Surgeon: [name], MD;  [facility];  Service: Gynecology;  Laterality: N/A;   CARDIAC CATHETERIZATION  10/14/2017   CARPAL TUNNEL RELEASE Right 04/10/2021   Procedure: RIGHT CARPAL TUNNEL RELEASE;  Surgeon: Leandrew Koyanagi, MD;  Location: [facility];  Service: Orthopedics;  Laterality: Right;   CORONARY ANGIOPLASTY WITH STENT PLACEMENT  12/30/2004   Patient reported   CORONARY ARTERY BYPASS GRAFT N/A 10/19/2017   Procedure: CORONARY ARTERY BYPASS GRAFTING (CABG) times four using the right saphaneous vein. Harvested endoscopicly and left internal mammary artery.;  Surgeon: Grace Isaac, MD;  Location: [facility];  Service: Open Heart Surgery;  Laterality: N/A;   CYSTOSCOPY N/A 07/09/2021   Procedure: CYSTOSCOPY;  Surgeon: [name], MD;  [facility];  Service: Gynecology;  Laterality: N/A;   DILATION AND CURETTAGE OF UTERUS  1980s   LEFT HEART CATH AND CORONARY  ANGIOGRAPHY N/A 10/14/2017   Procedure: LEFT HEART CATH AND CORONARY ANGIOGRAPHY;  Surgeon: Martinique, Peter M, MD;  Location: [facility];  Service: Cardiovascular;  Laterality: N/A;   LEFT HEART CATH AND CORS/GRAFTS ANGIOGRAPHY N/A 01/01/2018   Procedure: LEFT HEART CATH AND CORS/GRAFTS ANGIOGRAPHY;  Surgeon: Jettie Booze, MD;  Location: [facility];  Service: Cardiovascular;  Laterality: N/A;   LEFT HEART CATH AND CORS/GRAFTS ANGIOGRAPHY N/A 08/16/2018   Procedure: LEFT HEART CATH AND CORS/GRAFTS ANGIOGRAPHY;  Surgeon: Nelva Bush, MD;  Location: [facility];  Service: Cardiovascular;  Laterality: N/A;   PILONIDAL CYST EXCISION  1980s   TEE WITHOUT CARDIOVERSION N/A 10/19/2017   Procedure: TRANSESOPHAGEAL ECHOCARDIOGRAM (TEE);  Surgeon: Grace Isaac, MD;  Location: [facility];  Service: Open Heart Surgery;  Laterality: N/A;   TRIGGER FINGER RELEASE Right 04/10/2021   Procedure: RIGHT LONG FINGER TRIGGER RELEASE;  Surgeon: Leandrew Koyanagi, MD;  Location: [facility];  Service: Orthopedics;  Laterality: Right;   VAGINAL HYSTERECTOMY N/A 07/09/2021   Procedure: HYSTERECTOMY VAGINAL;  Surgeon: [name], MD;  [facility];  Service: Gynecology;  Laterality: N/A;  total time requested for all procedures is 3 hours   VAGINAL PROLAPSE REPAIR N/A 07/09/2021   Procedure: VAGINAL VAULT SUSPENSION;  Surgeon: [name], MD;  [facility];  Service: Gynecology;  Laterality: N/A;   Patient Active Problem List  Diagnosis Date Noted   Coronary artery disease of bypass graft of native heart with stable angina pectoris ([place]) 12/19/2021   Chronic diarrhea 09/06/2021   Carpal tunnel syndrome on right    Trigger finger, right middle finger    Complete uterine prolapse 03/09/2021   Non-ST elevation (NSTEMI) myocardial infarction ([place]) 02/17/2018   Type 2 diabetes mellitus with complication, with long-term current use of insulin ([place]) 02/17/2018   Chest pain  01/01/2018   Unstable angina ([place]) 01/01/2018   Arm contusion, left, initial encounter 11/25/2017   Contusion of right knee 11/25/2017   Hx of CABG 10/19/2017   Epigastric pain    Pancreatitis 10/10/2017   Hyperlipidemia LDL goal <70 09/14/2017   Essential hypertension 05/01/2017   Major depression, recurrent ([place]) 05/01/2017   Tobacco abuse 05/01/2017   History of MI (myocardial infarction) 05/01/2017   Allergic rhinitis 05/01/2017   History of arthritis 05/01/2017   Anxiety disorder, unspecified 05/01/2017   Bladder prolapse, female, acquired 11/09/2013    PCP: Betty Martinique  REFERRING PROVIDER: Frankey Shown  REFERRING DIAG: [phone]   Rationale for Evaluation and Treatment Rehabilitation  THERAPY DIAG:  Pain in right hip  Pain in left hip  Muscle weakness (generalized)  Other low back pain  Other abnormalities of gait and mobility  ONSET DATE: 03/15/22  SUBJECTIVE:                                                                                                                                                                                           SUBJECTIVE STATEMENT: I have pain that starts in my hips and goes into my back and down my leg. I finished a steroid pack this morning, and my back has been hurting since.  PERTINENT HISTORY:  Anxiety and depression, arthritis, HTN, DM type 2, A&P cystocele and rectocele repair, carpal tunnel release, CABG, L heart cath and angiographys, trigger finger release, hysterectomy, vaginal prolapse repair    PAIN:  Are you having pain? Yes: NPRS scale: 8/10 Pain location: R hip and low back Pain description: throbbing, stabbing, shooting Aggravating factors: walking for a long time Relieving factors: laying still   PRECAUTIONS: Fall  WEIGHT BEARING RESTRICTIONS No  FALLS:  Has patient fallen in last 6 months? No  LIVING ENVIRONMENT: Lives with: lives with their family Lives in: House/apartment Stairs: Yes:  External: 5 steps; none Has following equipment at home: Single point cane  OCCUPATION: Best boy  PLOF: Independent  PATIENT GOALS get stronger and wants no pain    OBJECTIVE:   DIAGNOSTIC FINDINGS:  Diffusely degenerative facet joints.  No acute abnormalities.  PATIENT SURVEYS:  FOTO 26  SCREENING FOR RED FLAGS: Bowel or bladder incontinence: No Spinal tumors: No Cauda equina syndrome: No Compression fracture: No Abdominal aneurysm: No  COGNITION:  Overall cognitive status: Within functional limits for tasks assessed     SENSATION: WFL  MUSCLE LENGTH: Hamstrings: mild tightness in bilateral HS  POSTURE: rounded shoulders, forward head, anterior pelvic tilt, and flexed trunk    LUMBAR ROM: no pain  Active  A/PROM  eval  Flexion WNL  Extension WFL  Right lateral flexion To patella  Left lateral flexion To patella  Right rotation WFL  Left rotation WFL   (Blank rows = not tested)  LOWER EXTREMITY ROM:   WFL   LOWER EXTREMITY MMT:    MMT Right eval Left eval  Hip flexion 3 3+  Hip extension 3+ 3+  Hip abduction 3- 3  Hip adduction    Hip internal rotation 2+ 3+  Hip external rotation 2+ 3+  Knee flexion 4+ 4+  Knee extension 4+ 4+  Ankle dorsiflexion    Ankle plantarflexion    Ankle inversion    Ankle eversion     (Blank rows = not tested)  LUMBAR SPECIAL TESTS:  Straight leg raise test: Negative and FABER test: Positive  FUNCTIONAL TESTS:  5 times sit to stand: 16.26 Timed up and go (TUG): 10.32  GAIT: Distance walked: in clinic distances Assistive device utilized: None Level of assistance: Complete Independence Comments: Trendelenberg, decreased step length, decreased stand time, slowed gait    TODAY'S TREATMENT  POC, review HEP   PATIENT EDUCATION:  Education details: POC Person educated: Patient Education method: Explanation Education comprehension: verbalized understanding and returned demonstration   HOME  EXERCISE PROGRAM: Access Code: [email or web address] URL: [email or web address] Date: 04/01/2022 Prepared by: Andris Baumann  Exercises - Supine Bridge  - 2 x daily - 7 x weekly - 2 sets - 10 reps - 3 hold - Supine Lower Trunk Rotation  - 2 x daily - 7 x weekly - 2 sets - 10 reps - Supine Posterior Pelvic Tilt  - 2 x daily - 7 x weekly - 2 sets - 10 reps - 5 hold   ASSESSMENT:  CLINICAL IMPRESSION: Patient is a 61 y.o. female who was seen today for physical therapy evaluation and treatment for bilateral hip and low back pain. She does not have any numbness or tingling in her legs. She presents with hip weakness and some tightness in LEs. She has a history of abdominal surgeries which she was educated could be contributing to her low back pain due to core weakness. Also educated about how her low back may be compensating for weakness in her hips. She will benefit from skilled PT intervention to address strength deficits and pain to be able to return to daily activities and recreation without pain and difficulty.   REHAB POTENTIAL: Good  CLINICAL DECISION MAKING: Stable/uncomplicated  EVALUATION COMPLEXITY: Low   GOALS: Goals reviewed with patient? Yes  SHORT TERM GOALS: Target date: 04/29/22  Patient will be independent with initial HEP.  Goal status: INITIAL    LONG TERM GOALS: Target date: 06/03/22  Patient will complete STS in <12 seconds to demonstrate decrease risk for falls and improved functional LE strength. Baseline: 16.26s Goal status: INITIAL  2.  Patient will demonstrate improved functional strength as demonstrated by >= 4/5 in all muscle groups. Goal status: INITIAL  3.  Patient will report 14 on lumbar FOTO to demonstrate improved functional ability.  Baseline: 44 Goal status: INITIAL  4.  Patient will tolerate 30 min of walking without pain in hips or low back.  Goal status: INITIAL    PLAN: PT FREQUENCY: 2x/week  PT DURATION: 8 weeks  PLANNED  INTERVENTIONS: Therapeutic exercises, Therapeutic activity, Neuromuscular re-education, Balance training, Gait training, Patient/Family education, Joint mobilization, Dry Needling, Electrical stimulation, Cryotherapy, Moist heat, Taping, Vasopneumatic device, Traction, Ionotophoresis 1m/ml Dexamethasone, Manual therapy, and Re-evaluation.  PLAN FOR NEXT SESSION: start hip strengthening, low back stretches, review HEP   MAndris Baumann PT 04/01/2022, 4:29 PM

## 2022-04-01 NOTE — Telephone Encounter (Signed)
Spoke with pt to let her know about labs that are due. Pt verbalized understanding. Lab orders placed.

## 2022-04-28 ENCOUNTER — Ambulatory Visit (INDEPENDENT_AMBULATORY_CARE_PROVIDER_SITE_OTHER): Payer: Commercial Managed Care - HMO | Admitting: Neurology

## 2022-04-28 ENCOUNTER — Encounter: Payer: Self-pay | Admitting: Neurology

## 2022-04-28 ENCOUNTER — Other Ambulatory Visit: Payer: Self-pay | Admitting: Family Medicine

## 2022-04-28 VITALS — BP 105/66 | HR 69 | Ht 66.0 in | Wt 192.5 lb

## 2022-04-28 DIAGNOSIS — E118 Type 2 diabetes mellitus with unspecified complications: Secondary | ICD-10-CM

## 2022-04-28 DIAGNOSIS — Z720 Tobacco use: Secondary | ICD-10-CM | POA: Diagnosis not present

## 2022-04-28 DIAGNOSIS — R0683 Snoring: Secondary | ICD-10-CM

## 2022-04-28 DIAGNOSIS — I252 Old myocardial infarction: Secondary | ICD-10-CM

## 2022-04-28 DIAGNOSIS — G4719 Other hypersomnia: Secondary | ICD-10-CM

## 2022-04-28 DIAGNOSIS — R5382 Chronic fatigue, unspecified: Secondary | ICD-10-CM

## 2022-04-28 DIAGNOSIS — I25708 Atherosclerosis of coronary artery bypass graft(s), unspecified, with other forms of angina pectoris: Secondary | ICD-10-CM

## 2022-04-28 DIAGNOSIS — Z794 Long term (current) use of insulin: Secondary | ICD-10-CM

## 2022-04-28 DIAGNOSIS — F3341 Major depressive disorder, recurrent, in partial remission: Secondary | ICD-10-CM

## 2022-04-28 NOTE — Progress Notes (Signed)
SLEEP MEDICINE CLINIC    Provider:  Larey Seat, MD  Primary Care Physician:  Martinique, Betty G, MD [place] [address]     Referring Provider: Martinique, Betty G, [place],  [place] [address]          Chief Complaint according to patient   Patient presents with:     New Patient (Initial Visit)           HISTORY OF PRESENT ILLNESS:  Victoria Eaton is a 61 y.o. Caucasian female patient seen here on 04-28-2022 upon referral on 04/28/2022 from PCP for a sleep consultation..  Chief concern according to patient :  alone. Pt referred for sleep consult due to BMI over 30, only sleeping 3-4hrs a night, CAD, and hypertension. No prior SS. Pt reports waking up from hand pain, numbness  with constant fatigue, some morning HA. I don't sleep well ever since being a teenager.    Victoria Eaton  has a past medical history of Anxiety, Arthritis, Bladder prolapse, female, acquired (09/14/2017), Coronary artery disease, Depression, High cholesterol, History of stomach ulcers, Hypertension, Myocardial infarction ([place]) (12/30/2004), long history of Tobacco abuse, and Type II diabetes mellitus ([place]).     Sleep relevant medical history: Nocturia 1-2,  non restorative sleep since 18 s.      Family medical /sleep history: no other family member on CPAP with OSA, insomnia, sleep walkers.    Social history:  Grew up in the [employer]. 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   and lives in a household with her sister and her dog since 2022. Family status is widowed since 2020 with one adult daughter . Husband had wernicke korsakoff-  Pets are present. Tobacco use-  current use/ Chantix.    ETOH use:  wine, gin, vodka- every day, 2-3 ,  Caffeine intake in form of Coffee( 2 cups in AM ) Soda( 16 ounces a day at lunch ) Tea ( /) or energy drinks. Regular exercise in form of gym yoga-.      Sleep habits are as follows: The  patient's dinner time is between 7 PM. The patient falls asleep on the couch in front of TV - for 2 hours or more. She then goes to bed at 12 PM and continues to sleep for 3-4  hours, wakes for one bathroom breaks,  snores,  The preferred sleep position is laterally, with the support of 1-2 pillows.  Dreams are reportedly frequent/ not vivid.  6  AM is the usual rise time. The patient wakes up spontaneously  She reports not feeling refreshed or restored in AM, with symptoms such as dry mouth, congestion,  phlegm, morning headaches, and residual fatigue.  Naps are taken frequently, lasting from 40 to 120 minutes and are as unrefreshing as nocturnal sleep.    Review of Systems: Out of a complete 14 system review, the patient complains of only the following symptoms, and all other reviewed systems are negative.:  Fatigue, sleepiness , snoring, fragmented sleep, Insomnia - Poor sleep hygiene.  Hand pain,    How likely are you to doze in the following situations: 0 = not likely, 1 = slight chance, 2 = moderate chance, 3 = high chance   Sitting and Reading? Watching Television? Sitting inactive in a public place (theater or meeting)? As a passenger in a car for an hour without a break? Lying down in the afternoon when circumstances permit?  Sitting and talking to someone? Sitting quietly after lunch without alcohol? In a car, while stopped for a few minutes in traffic?   Total = 11/ 24 points   FSS endorsed at 53/ 63 points.  Depression is endorsed, life and circumstances, 5/ 15-    In 2 years she and he sister lost their husbands, their houses, and their mother.    Handwriting unchanged, right side action tremor.   Social History   Socioeconomic History   Marital status: Widowed    Spouse name: Glendell Docker, passed in June 2021 of cancer    Number of children: 1   Years of education: Not on file   Highest education level: High school graduate  Occupational History   Not on file  Tobacco  Use   Smoking status: Some Days    Packs/day: 0.75    Years: 45.00    Total pack years: 33.75    Types: Cigarettes    Start date: 05/01/1972    Last attempt to quit: 09/30/2017    Years since quitting: 4.5   Smokeless tobacco: Never  Vaping Use   Vaping Use: Never used  Substance and Sexual Activity   Alcohol use: Yes    Alcohol/week: 7.0- 12 standard drinks of alcohol    Types: 7 -12 Standard drinks or equivalent per week    Comment: daily: 2-3 wine   Drug use: No   Sexual activity: Not Currently  Other Topics Concern   Not on file  Social History Narrative   Lives with sister   R handed   Caffeine: couple coups of coffee and a bottle of diet coke a day   Social Determinants of Health   Financial Resource Strain: Not on file  Food Insecurity: Not on file  Transportation Needs: Not on file  Physical Activity: Not on file  Stress: Not on file  Social Connections: Not on file    Family History  Problem Relation Age of Onset   Heart disease Father    Hyperlipidemia Father    Hypertension Father    Colon cancer Neg Hx    Esophageal cancer Neg Hx    Stomach cancer Neg Hx    Rectal cancer Neg Hx     Past Medical History:  Diagnosis Date   Anxiety    Arthritis    "maybe in my left foot" (10/14/2017)   Bladder prolapse, female, acquired 09/14/2017   Coronary artery disease    Depression    High cholesterol    History of stomach ulcers    Hypertension    Myocardial infarction ([facility]) 12/30/2004   Tobacco abuse    Type II diabetes mellitus ([place])     Past Surgical History:  Procedure Laterality Date   ANTERIOR AND POSTERIOR REPAIR N/A 07/09/2021   Procedure: ANTERIOR (CYSTOCELE) AND POSTERIOR REPAIR (RECTOCELE) with perineorrhaphy;  Surgeon: [name], MD;  Location: [facility];  Service: Gynecology;  Laterality: N/A;   CARDIAC CATHETERIZATION  10/14/2017   CARPAL TUNNEL RELEASE Right 04/10/2021   Procedure: RIGHT CARPAL TUNNEL RELEASE;  Surgeon: Leandrew Koyanagi, MD;  Location: [facility];  Service: Orthopedics;  Laterality: Right;   CORONARY ANGIOPLASTY WITH STENT PLACEMENT  12/30/2004   Patient reported   CORONARY ARTERY BYPASS GRAFT N/A 10/19/2017   Procedure: CORONARY ARTERY BYPASS GRAFTING (CABG) times four using the right saphaneous vein. Harvested endoscopicly and left internal mammary artery.;  Surgeon: Grace Isaac, MD;  Location: [facility];  Service: Open Heart Surgery;  Laterality:  N/A;   CYSTOSCOPY N/A 07/09/2021   Procedure: CYSTOSCOPY;  Surgeon: [name], MD;  [facility]: [facility];  Service: Gynecology;  Laterality: N/A;   DILATION AND CURETTAGE OF UTERUS  1980s   LEFT HEART CATH AND CORONARY ANGIOGRAPHY N/A 10/14/2017   Procedure: LEFT HEART CATH AND CORONARY ANGIOGRAPHY;  Surgeon: Martinique, Peter M, MD;  Location: [facility];  Service: Cardiovascular;  Laterality: N/A;   LEFT HEART CATH AND CORS/GRAFTS ANGIOGRAPHY N/A 01/01/2018   Procedure: LEFT HEART CATH AND CORS/GRAFTS ANGIOGRAPHY;  Surgeon: Jettie Booze, MD;  Location: [facility];  Service: Cardiovascular;  Laterality: N/A;   LEFT HEART CATH AND CORS/GRAFTS ANGIOGRAPHY N/A 08/16/2018   Procedure: LEFT HEART CATH AND CORS/GRAFTS ANGIOGRAPHY;  Surgeon: Nelva Bush, MD;  Location: [facility];  Service: Cardiovascular;  Laterality: N/A;   PILONIDAL CYST EXCISION  1980s   TEE WITHOUT CARDIOVERSION N/A 10/19/2017   Procedure: TRANSESOPHAGEAL ECHOCARDIOGRAM (TEE);  Surgeon: Grace Isaac, MD;  Location: [facility];  Service: Open Heart Surgery;  Laterality: N/A;   TRIGGER FINGER RELEASE Right 04/10/2021   Procedure: RIGHT LONG FINGER TRIGGER RELEASE;  Surgeon: Leandrew Koyanagi, MD;  Location: [facility];  Service: Orthopedics;  Laterality: Right;   VAGINAL HYSTERECTOMY N/A 07/09/2021   Procedure: HYSTERECTOMY VAGINAL;  Surgeon: [name], MD;  [facility];  Service: Gynecology;  Laterality: N/A;  total time requested for all procedures is 3  hours   VAGINAL PROLAPSE REPAIR N/A 07/09/2021   Procedure: VAGINAL VAULT SUSPENSION;  Surgeon: [name], MD;  [facility];  Service: Gynecology;  Laterality: N/A;     Current Outpatient Medications on File Prior to Visit  Medication Sig Dispense Refill   acetaminophen (TYLENOL) 500 MG tablet Take 1 tablet (500 mg total) by mouth every 6 (six) hours as needed (pain). 30 tablet 0   amLODipine (NORVASC) 5 MG tablet TAKE ONE TABLET BY MOUTH ONE TIME DAILY 90 tablet 3   aspirin 81 MG EC tablet Take 81 mg by mouth daily.     clopidogrel (PLAVIX) 75 MG tablet Take 1 tablet (75 mg total) by mouth daily. 90 tablet 2   Continuous Blood Gluc Sensor (FREESTYLE LIBRE 14 DAY SENSOR) MISC APPLY ONE SENSOR TO THE BACK OF YOUR UPPER ARM. REPLACE EVERY 14 DAYS. 3 each 5   dapagliflozin propanediol (FARXIGA) 5 MG TABS tablet TAKE ONE TABLET BY MOUTH ONE TIME DAILY BEFORE BREAKFAST 90 tablet 3   Dulaglutide (TRULICITY) 1.5 SA/6.3KZ SOPN Inject 1.5 mg into the skin once a week. 3 mL 6   ezetimibe (ZETIA) 10 MG tablet TAKE ONE TABLET BY MOUTH ONE TIME DAILY 30 tablet 8   FEROSUL 325 (65 Fe) MG tablet TAKE ONE TABLET BY MOUTH ONE TIME DAILY 90 tablet 2   insulin aspart (NOVOLOG FLEXPEN) 100 UNIT/ML FlexPen Inject 5 Units into the skin 3 (three) times daily with meals as needed for high blood sugar.     Insulin Glargine (BASAGLAR KWIKPEN) 100 UNIT/ML Inject 53 Units into the skin daily. 45 mL 3   Insulin Pen Needle (ULTICARE MICRO PEN NEEDLES) 32G X 4 MM MISC USE WITH INJECTIONS FOUR TIMES A DAY 400 each 3   isosorbide mononitrate (IMDUR) 120 MG 24 hr tablet TAKE ONE TABLET BY MOUTH ONE TIME DAILY 90 tablet 2   lisinopril (ZESTRIL) 5 MG tablet TAKE ONE TABLET BY MOUTH ONE TIME DAILY 90 tablet 1   loratadine (CLARITIN) 10 MG tablet Take  10 mg by mouth daily.     methocarbamol (ROBAXIN) 500 MG tablet Take 1 tablet (500 mg total) by mouth 2 (two) times daily as needed. 20 tablet 0   metoprolol  tartrate (LOPRESSOR) 50 MG tablet TAKE ONE TABLET BY MOUTH TWICE A DAY 180 tablet 2   Multiple Vitamin (MULTIVITAMIN WITH MINERALS) TABS tablet Take 1 tablet by mouth daily.     nitroGLYCERIN (NITROSTAT) 0.4 MG SL tablet Place 0.4 mg under the tongue every 5 (five) minutes as needed for chest pain.     Omega-3 Fatty Acids (FISH OIL) 1200 MG CAPS Take 1,200 mg by mouth daily.     oxyCODONE (OXY IR/ROXICODONE) 5 MG immediate release tablet Take 1 tablet (5 mg total) by mouth every 4 (four) hours as needed for severe pain. 15 tablet 0   polyethylene glycol powder (GLYCOLAX/MIRALAX) 17 GM/SCOOP powder Take 17 g by mouth daily. Drink 17g (1 scoop) dissolved in water per day. 255 g 0   predniSONE (STERAPRED UNI-PAK 21 TAB) 10 MG (21) TBPK tablet Take as directed 21 tablet 3   rosuvastatin (CRESTOR) 40 MG tablet TAKE ONE TABLET BY MOUTH ONE TIME DAILY 90 tablet 3   sertraline (ZOLOFT) 100 MG tablet TAKE TWO TABLETS BY MOUTH ONE TIME DAILY 180 tablet 2   sulfaSALAzine (AZULFIDINE) 500 MG EC tablet Take 2 tablets (1,000 mg total) by mouth 2 (two) times daily. 120 tablet 11   Turmeric 500 MG CAPS Take 1,000 mg by mouth daily.     sulfaSALAzine (AZULFIDINE) 500 MG EC tablet Take 3 tablets (1,500 mg total) by mouth 2 (two) times daily. For 8 weeks 336 tablet 0   No current facility-administered medications on file prior to visit.    Allergies  Allergen Reactions   Januvia [Sitagliptin] Nausea And Vomiting and Other (See Comments)    Pancreatitis (this was in Epic, but patient was unaware of this)    Metformin And Related Diarrhea   Penicillins Other (See Comments)    Unknown childhood reaction Has patient had a PCN reaction causing immediate rash, facial/tongue/throat swelling, SOB or lightheadedness with hypotension: Unknown Has patient had a PCN reaction causing severe rash involving mucus membranes or skin necrosis: Unknown Has patient had a PCN reaction that required hospitalization: Unknown Has  patient had a PCN reaction occurring within the last 10 years: No If all of the above answers are "NO", then may proceed with Cephalosporin use.     Physical exam:  Today's Vitals   04/28/22 1117  BP: 105/66  Pulse: 69  Weight: 192 lb 8 oz (87.3 kg)  Height: 5' 6"  (1.676 m)   Body mass index is 31.07 kg/m.   Wt Readings from Last 3 Encounters:  04/28/22 192 lb 8 oz (87.3 kg)  03/26/22 194 lb (88 kg)  01/02/22 198 lb (89.8 kg)     Ht Readings from Last 3 Encounters:  04/28/22 5' 6"  (1.676 m)  03/26/22 5' 6"  (1.676 m)  01/02/22 5' 6"  (1.676 m)      General: The patient is awake, alert and appears not in acute distress. The patient is groomed. Head: Normocephalic, atraumatic. Neck is supple. Mallampati 2 with elongated uvula, red and irritable looking mucosa. ,  neck circumference:15 inches . Nasal airflow congested . Dental status: biological Cardiovascular:  Regular rate and cardiac rhythm by pulse,  without distended neck veins. Respiratory: Lungs are clear to auscultation.  Skin:  Without evidence of ankle edema, or rash. Trunk: The patient's posture is  erect.   Neurologic exam : The patient is awake and alert, oriented to place and time.   Memory subjective described as intact.  Attention span & concentration ability appears normal.  Speech is fluent,  without  dysarthria, dysphonia or aphasia.  Mood and affect are depressed.    Cranial nerves: no loss of smell or taste reported  Pupils are equal and briskly reactive to light. Funduscopic exam deferred.  Extraocular movements in vertical and horizontal planes were intact and without nystagmus. No Diplopia. Visual fields by finger perimetry are intact. Hearing was impaired , tinnitus ,  to soft voice and finger rubbing.    tuning fork- air conduction was normal, bone conduction was louder in the left.  Facial sensation intact to fine touch.  Facial motor strength is symmetric and tongue and uvula move midline.   Neck ROM : rotation, tilt and flexion extension were normal for age and shoulder shrug was symmetrical.    Motor exam:  Symmetric bulk, tone and ROM.   Normal tone without cog wheeling, reduced grip strength .   Sensory:  Fine touch and vibration were normal.  Proprioception tested in the upper extremities was normal.   Coordination: Rapid alternating movements in the fingers/hands were of normal speed.  The Finger-to-nose maneuver was intact without evidence of ataxia, dysmetria , their was action tremor.    Gait and station: Patient could rise unassisted from a seated position, walked without assistive device.  Stance is of normal width/ base and the patient turned with 3 steps.  Toe and heel walk were deferred.  Deep tendon reflexes: in the  upper and lower extremities are symmetric and intact.  Babinski response was deferred.      After spending a total time of 45  minutes face to face and additional time for physical and neurologic examination, review of laboratory studies,  personal review of imaging studies, reports and results of other testing and review of referral information / records as far as provided in visit, I have established the following assessments:  1)  The patient is smoking and drinking- and depressed. This alone can be cause for EDS and high FSS. 2)   she has poor sleep habits, which affect her sleep duration and quality.  3)  snoring loudly she is at risk of OSA. She also has CAD,  4)  Carpaltunnel- hand numbness is interrupting sleep sometimes, not caused by sleep position. Neck pain. Has DM.     My Plan is to proceed with:  1) Screening for sleep apnea. High risk for hypoxia. SPLIT at AHI 20/h , please , if [facility] is only allowing in home study, HST will need to be done.  2) take Wellbutrin in AM. 3) reduce alcohol intake and smoking tobacco.   I would like to thank Martinique, Betty G, MD and Martinique, Betty G, [place],  [place] [address]  for allowing me to meet with and to take care of this pleasant patient.    Electronically signed by: Larey Seat, MD 04/28/2022 11:36 AM  [facility] Board certified by The [employer] of Sleep Medicine and Diplomate of the [employer] of Sleep Medicine. Board certified In Neurology through the [place], Fellow of the [employer] of Neurology. Medical Director of [facility].

## 2022-05-10 ENCOUNTER — Other Ambulatory Visit: Payer: Self-pay | Admitting: Family Medicine

## 2022-05-10 DIAGNOSIS — E118 Type 2 diabetes mellitus with unspecified complications: Secondary | ICD-10-CM

## 2022-05-14 ENCOUNTER — Observation Stay (HOSPITAL_BASED_OUTPATIENT_CLINIC_OR_DEPARTMENT_OTHER)
Admission: EM | Admit: 2022-05-14 | Discharge: 2022-05-16 | Disposition: A | Discharge status: Home / Self Care | Payer: Commercial Managed Care - HMO | Attending: Surgery | Admitting: Surgery

## 2022-05-14 ENCOUNTER — Encounter (HOSPITAL_BASED_OUTPATIENT_CLINIC_OR_DEPARTMENT_OTHER): Payer: Self-pay

## 2022-05-14 ENCOUNTER — Inpatient Hospital Stay (HOSPITAL_COMMUNITY): Payer: Commercial Managed Care - HMO

## 2022-05-14 ENCOUNTER — Emergency Department (HOSPITAL_BASED_OUTPATIENT_CLINIC_OR_DEPARTMENT_OTHER): Payer: Commercial Managed Care - HMO

## 2022-05-14 ENCOUNTER — Other Ambulatory Visit: Payer: Self-pay

## 2022-05-14 DIAGNOSIS — Z794 Long term (current) use of insulin: Secondary | ICD-10-CM | POA: Insufficient documentation

## 2022-05-14 DIAGNOSIS — Z79899 Other long term (current) drug therapy: Secondary | ICD-10-CM | POA: Diagnosis not present

## 2022-05-14 DIAGNOSIS — R1011 Right upper quadrant pain: Secondary | ICD-10-CM | POA: Diagnosis present

## 2022-05-14 DIAGNOSIS — Z7982 Long term (current) use of aspirin: Secondary | ICD-10-CM | POA: Insufficient documentation

## 2022-05-14 DIAGNOSIS — Z7902 Long term (current) use of antithrombotics/antiplatelets: Secondary | ICD-10-CM | POA: Insufficient documentation

## 2022-05-14 DIAGNOSIS — Z951 Presence of aortocoronary bypass graft: Secondary | ICD-10-CM | POA: Insufficient documentation

## 2022-05-14 DIAGNOSIS — N183 Chronic kidney disease, stage 3 unspecified: Secondary | ICD-10-CM | POA: Insufficient documentation

## 2022-05-14 DIAGNOSIS — E1122 Type 2 diabetes mellitus with diabetic chronic kidney disease: Secondary | ICD-10-CM | POA: Diagnosis not present

## 2022-05-14 DIAGNOSIS — K8020 Calculus of gallbladder without cholecystitis without obstruction: Secondary | ICD-10-CM | POA: Diagnosis not present

## 2022-05-14 DIAGNOSIS — F17210 Nicotine dependence, cigarettes, uncomplicated: Secondary | ICD-10-CM | POA: Diagnosis not present

## 2022-05-14 DIAGNOSIS — I2510 Atherosclerotic heart disease of native coronary artery without angina pectoris: Secondary | ICD-10-CM | POA: Diagnosis not present

## 2022-05-14 DIAGNOSIS — I129 Hypertensive chronic kidney disease with stage 1 through stage 4 chronic kidney disease, or unspecified chronic kidney disease: Secondary | ICD-10-CM | POA: Insufficient documentation

## 2022-05-14 DIAGNOSIS — Z7985 Long-term (current) use of injectable non-insulin antidiabetic drugs: Secondary | ICD-10-CM | POA: Insufficient documentation

## 2022-05-14 DIAGNOSIS — Z7984 Long term (current) use of oral hypoglycemic drugs: Secondary | ICD-10-CM | POA: Diagnosis not present

## 2022-05-14 DIAGNOSIS — K8050 Calculus of bile duct without cholangitis or cholecystitis without obstruction: Secondary | ICD-10-CM | POA: Diagnosis not present

## 2022-05-14 DIAGNOSIS — Z955 Presence of coronary angioplasty implant and graft: Secondary | ICD-10-CM | POA: Diagnosis not present

## 2022-05-14 DIAGNOSIS — N179 Acute kidney failure, unspecified: Secondary | ICD-10-CM | POA: Insufficient documentation

## 2022-05-14 LAB — CBC WITH DIFFERENTIAL/PLATELET
Abs Immature Granulocytes: 0.03 10*3/uL (ref 0.00–0.07)
Basophils Absolute: 0 10*3/uL (ref 0.0–0.1)
Basophils Relative: 1 %
Eosinophils Absolute: 0.2 10*3/uL (ref 0.0–0.5)
Eosinophils Relative: 2 %
HCT: 37.5 % (ref 36.0–46.0)
Hemoglobin: 12 g/dL (ref 12.0–15.0)
Immature Granulocytes: 1 %
Lymphocytes Relative: 26 %
Lymphs Abs: 1.7 10*3/uL (ref 0.7–4.0)
MCH: 29.3 pg (ref 26.0–34.0)
MCHC: 32 g/dL (ref 30.0–36.0)
MCV: 91.7 fL (ref 80.0–100.0)
Monocytes Absolute: 0.6 10*3/uL (ref 0.1–1.0)
Monocytes Relative: 8 %
Neutro Abs: 4.1 10*3/uL (ref 1.7–7.7)
Neutrophils Relative %: 62 %
Platelets: 137 10*3/uL — ABNORMAL LOW (ref 150–400)
RBC: 4.09 MIL/uL (ref 3.87–5.11)
RDW: 16 % — ABNORMAL HIGH (ref 11.5–15.5)
WBC: 6.6 10*3/uL (ref 4.0–10.5)
nRBC: 0 % (ref 0.0–0.2)

## 2022-05-14 LAB — COMPREHENSIVE METABOLIC PANEL
ALT: 19 U/L (ref 0–44)
AST: 22 U/L (ref 15–41)
Albumin: 4 g/dL (ref 3.5–5.0)
Alkaline Phosphatase: 50 U/L (ref 38–126)
Anion gap: 8 (ref 5–15)
BUN: 25 mg/dL — ABNORMAL HIGH (ref 8–23)
CO2: 25 mmol/L (ref 22–32)
Calcium: 9.2 mg/dL (ref 8.9–10.3)
Chloride: 106 mmol/L (ref 98–111)
Creatinine, Ser: 1.17 mg/dL — ABNORMAL HIGH (ref 0.44–1.00)
GFR, Estimated: 52 mL/min — ABNORMAL LOW (ref >60)
Glucose, Bld: 142 mg/dL — ABNORMAL HIGH (ref 70–99)
Potassium: 3.6 mmol/L (ref 3.5–5.1)
Sodium: 139 mmol/L (ref 135–145)
Total Bilirubin: 0.6 mg/dL (ref 0.3–1.2)
Total Protein: 7.4 g/dL (ref 6.5–8.1)

## 2022-05-14 LAB — URINALYSIS, MICROSCOPIC (REFLEX): WBC, UA: NONE SEEN WBC/hpf (ref 0–5)

## 2022-05-14 LAB — URINALYSIS, ROUTINE W REFLEX MICROSCOPIC
Bilirubin Urine: NEGATIVE
Glucose, UA: >=500 mg/dL — AB
Hgb urine dipstick: NEGATIVE
Ketones, ur: NEGATIVE mg/dL
Leukocytes,Ua: NEGATIVE
Nitrite: NEGATIVE
Protein, ur: NEGATIVE mg/dL
Specific Gravity, Urine: 1.015 (ref 1.005–1.030)
pH: 7 (ref 5.0–8.0)

## 2022-05-14 LAB — TROPONIN I (HIGH SENSITIVITY)
Troponin I (High Sensitivity): 6 ng/L (ref <18)
Troponin I (High Sensitivity): 10 ng/L (ref <18)

## 2022-05-14 LAB — LIPASE, BLOOD: Lipase: 34 U/L (ref 11–51)

## 2022-05-14 MED ORDER — HYDROMORPHONE HCL 2 MG/ML IJ SOLN
1.0000 mg | INTRAMUSCULAR | Status: DC | PRN
  Administered 2022-05-14: 1 mg via INTRAVENOUS
  Filled 2022-05-14: qty 1

## 2022-05-14 MED ORDER — IOHEXOL 300 MG/ML  SOLN
100.0000 mL | Freq: Once | INTRAMUSCULAR | Status: AC | PRN
  Administered 2022-05-14: 100 mL via INTRAVENOUS

## 2022-05-14 MED ORDER — MORPHINE SULFATE (PF) 4 MG/ML IV SOLN
4.0000 mg | Freq: Once | INTRAVENOUS | Status: AC
  Administered 2022-05-14: 4 mg via INTRAVENOUS
  Filled 2022-05-14: qty 1

## 2022-05-14 MED ORDER — LACTATED RINGERS IV BOLUS
1000.0000 mL | Freq: Once | INTRAVENOUS | Status: AC
  Administered 2022-05-14: 1000 mL via INTRAVENOUS

## 2022-05-14 MED ORDER — ONDANSETRON HCL 4 MG/2ML IJ SOLN
4.0000 mg | Freq: Once | INTRAMUSCULAR | Status: AC
  Administered 2022-05-14: 4 mg via INTRAVENOUS
  Filled 2022-05-14: qty 2

## 2022-05-14 NOTE — ED Notes (Signed)
Pt ambulatory to restroom with independent steady gait °

## 2022-05-14 NOTE — ED Notes (Signed)
[facility] at bedside. Pt stable for transport. 

## 2022-05-14 NOTE — ED Provider Notes (Signed)
I spoke with Dr. Brantley Stage.  He will come evaluate the patient in the ED   Dorie Rank, MD 05/14/22 2012

## 2022-05-14 NOTE — H&P (View-Only) (Signed)
Victoria Eaton is an 61 y.o. female.   Chief Complaint: abdominal pain HPI: pt sent from [facility] ED with 1 day hx of severe epigastric and RUQ abdominal pain.  No N/V [ain sharp , severe and constant since noon. Meds help some but pain still present. CT shows large GS. Hx of Crohn's but pain is not consistent with her IBD which has been quiet on meds   Past Medical History:  Diagnosis Date   Anxiety    Arthritis    "maybe in my left foot" (10/14/2017)   Bladder prolapse, female, acquired 09/14/2017   Coronary artery disease    Depression    High cholesterol    History of stomach ulcers    Hypertension    Myocardial infarction ([place]) 12/30/2004   Tobacco abuse    Type II diabetes mellitus ([place])     Past Surgical History:  Procedure Laterality Date   ANTERIOR AND POSTERIOR REPAIR N/A 07/09/2021   Procedure: ANTERIOR (CYSTOCELE) AND POSTERIOR REPAIR (RECTOCELE) with perineorrhaphy;  Surgeon: [name], MD;  [facility];  Service: Gynecology;  Laterality: N/A;   CARDIAC CATHETERIZATION  10/14/2017   CARPAL TUNNEL RELEASE Right 04/10/2021   Procedure: RIGHT CARPAL TUNNEL RELEASE;  Surgeon: Leandrew Koyanagi, MD;  Location: [place];  Service: Orthopedics;  Laterality: Right;   CORONARY ANGIOPLASTY WITH STENT PLACEMENT  12/30/2004   Patient reported   CORONARY ARTERY BYPASS GRAFT N/A 10/19/2017   Procedure: CORONARY ARTERY BYPASS GRAFTING (CABG) times four using the right saphaneous vein. Harvested endoscopicly and left internal mammary artery.;  Surgeon: Grace Isaac, MD;  Location: [place];  Service: Open Heart Surgery;  Laterality: N/A;   CYSTOSCOPY N/A 07/09/2021   Procedure: CYSTOSCOPY;  Surgeon: [name], MD;  [facility];  Service: Gynecology;  Laterality: N/A;   DILATION AND CURETTAGE OF UTERUS  1980s   LEFT HEART CATH AND CORONARY ANGIOGRAPHY N/A 10/14/2017   Procedure: LEFT HEART CATH AND CORONARY ANGIOGRAPHY;  Surgeon: Martinique, Peter M, MD;  Location: [place] [facility];  Service: Cardiovascular;  Laterality: N/A;   LEFT HEART CATH AND CORS/GRAFTS ANGIOGRAPHY N/A 01/01/2018   Procedure: LEFT HEART CATH AND CORS/GRAFTS ANGIOGRAPHY;  Surgeon: Jettie Booze, MD;  Location: [place] [facility];  Service: Cardiovascular;  Laterality: N/A;   LEFT HEART CATH AND CORS/GRAFTS ANGIOGRAPHY N/A 08/16/2018   Procedure: LEFT HEART CATH AND CORS/GRAFTS ANGIOGRAPHY;  Surgeon: Nelva Bush, MD;  Location: [place] [facility];  Service: Cardiovascular;  Laterality: N/A;   PILONIDAL CYST EXCISION  1980s   TEE WITHOUT CARDIOVERSION N/A 10/19/2017   Procedure: TRANSESOPHAGEAL ECHOCARDIOGRAM (TEE);  Surgeon: Grace Isaac, MD;  Location: [place];  Service: Open Heart Surgery;  Laterality: N/A;   TRIGGER FINGER RELEASE Right 04/10/2021   Procedure: RIGHT LONG FINGER TRIGGER RELEASE;  Surgeon: Leandrew Koyanagi, MD;  Location: [place];  Service: Orthopedics;  Laterality: Right;   VAGINAL HYSTERECTOMY N/A 07/09/2021   Procedure: HYSTERECTOMY VAGINAL;  Surgeon: [name], MD;  [facility];  Service: Gynecology;  Laterality: N/A;  total time requested for all procedures is 3 hours   VAGINAL PROLAPSE REPAIR N/A 07/09/2021   Procedure: VAGINAL VAULT SUSPENSION;  Surgeon: [name], MD;  [facility];  Service: Gynecology;  Laterality: N/A;    Family History  Problem Relation Age of Onset   Heart disease Father    Hyperlipidemia Father    Hypertension Father    Colon cancer Neg Hx  Esophageal cancer Neg Hx    Stomach cancer Neg Hx    Rectal cancer Neg Hx    Social History:  reports that she has been smoking cigarettes. She started smoking about 50 years ago. She has a 33.75 pack-year smoking history. She has never used smokeless tobacco. She reports current alcohol use of about 7.0 standard drinks of alcohol per week. She reports that she does not use drugs.  Allergies:  Allergies  Allergen Reactions   Januvia [Sitagliptin]  Nausea And Vomiting and Other (See Comments)    Pancreatitis (this was in Epic, but patient was unaware of this)    Metformin And Related Diarrhea   Penicillins Other (See Comments)    Unknown childhood reaction Has patient had a PCN reaction causing immediate rash, facial/tongue/throat swelling, SOB or lightheadedness with hypotension: Unknown Has patient had a PCN reaction causing severe rash involving mucus membranes or skin necrosis: Unknown Has patient had a PCN reaction that required hospitalization: Unknown Has patient had a PCN reaction occurring within the last 10 years: No If all of the above answers are "NO", then may proceed with Cephalosporin use.     (Not in a hospital admission)   Results for orders placed or performed during the hospital encounter of 05/14/22 (from the past 48 hour(s))  Lipase, blood     Status: None   Collection Time: 05/14/22  3:34 PM  Result Value Ref Range   Lipase 34 11 - 51 U/L    Comment: Performed at [facility], [address] [place], [place] [address]  Comprehensive metabolic panel     Status: Abnormal   Collection Time: 05/14/22  3:34 PM  Result Value Ref Range   Sodium 139 135 - 145 mmol/L   Potassium 3.6 3.5 - 5.1 mmol/L   Chloride 106 98 - 111 mmol/L   CO2 25 22 - 32 mmol/L   Glucose, Bld 142 (H) 70 - 99 mg/dL    Comment: Glucose reference range applies only to samples taken after fasting for at least 8 hours.   BUN 25 (H) 8 - 23 mg/dL   Creatinine, Ser 1.17 (H) 0.44 - 1.00 mg/dL   Calcium 9.2 8.9 - 10.3 mg/dL   Total Protein 7.4 6.5 - 8.1 g/dL   Albumin 4.0 3.5 - 5.0 g/dL   AST 22 15 - 41 U/L   ALT 19 0 - 44 U/L   Alkaline Phosphatase 50 38 - 126 U/L   Total Bilirubin 0.6 0.3 - 1.2 mg/dL   GFR, Estimated 52 (L) >60 mL/min    Comment: (NOTE) Calculated using the CKD-EPI Creatinine Equation (2021)    Anion gap 8 5 - 15    Comment: Performed at [facility], [address] [place], [place] [address]   CBC with Differential     Status: Abnormal   Collection Time: 05/14/22  3:35 PM  Result Value Ref Range   WBC 6.6 4.0 - 10.5 K/uL   RBC 4.09 3.87 - 5.11 MIL/uL   Hemoglobin 12.0 12.0 - 15.0 g/dL   HCT 37.5 36.0 - 46.0 %   MCV 91.7 80.0 - 100.0 fL   MCH 29.3 26.0 - 34.0 pg   MCHC 32.0 30.0 - 36.0 g/dL   RDW 16.0 (H) 11.5 - 15.5 %   Platelets 137 (L) 150 - 400 K/uL   nRBC 0.0 0.0 - 0.2 %   Neutrophils Relative % 62 %   Neutro Abs 4.1 1.7 -  7.7 K/uL   Lymphocytes Relative 26 %   Lymphs Abs 1.7 0.7 - 4.0 K/uL   Monocytes Relative 8 %   Monocytes Absolute 0.6 0.1 - 1.0 K/uL   Eosinophils Relative 2 %   Eosinophils Absolute 0.2 0.0 - 0.5 K/uL   Basophils Relative 1 %   Basophils Absolute 0.0 0.0 - 0.1 K/uL   Immature Granulocytes 1 %   Abs Immature Granulocytes 0.03 0.00 - 0.07 K/uL    Comment: Performed at [facility], [address] [place], [place] [address]  Troponin I (High Sensitivity)     Status: None   Collection Time: 05/14/22  4:17 PM  Result Value Ref Range   Troponin I (High Sensitivity) 6 <18 ng/L    Comment: (NOTE) Elevated high sensitivity troponin I (hsTnI) values and significant  changes across serial measurements may suggest ACS but many other  chronic and acute conditions are known to elevate hsTnI results.  Refer to the "Links" section for chest pain algorithms and additional  guidance. Performed at [facility], [address] [place], [place] [address]   Urinalysis, Routine w reflex microscopic     Status: Abnormal   Collection Time: 05/14/22  4:50 PM  Result Value Ref Range   Color, Urine YELLOW YELLOW   APPearance CLEAR CLEAR   Specific Gravity, Urine 1.015 1.005 - 1.030   pH 7.0 5.0 - 8.0   Glucose, UA >=500 (A) NEGATIVE mg/dL   Hgb urine dipstick NEGATIVE NEGATIVE   Bilirubin Urine NEGATIVE NEGATIVE   Ketones, ur NEGATIVE NEGATIVE mg/dL   Protein, ur NEGATIVE NEGATIVE mg/dL   Nitrite NEGATIVE NEGATIVE   Leukocytes,Ua  NEGATIVE NEGATIVE    Comment: Performed at [facility], [address] [place], [place] [address]  Urinalysis, Microscopic (reflex)     Status: Abnormal   Collection Time: 05/14/22  4:50 PM  Result Value Ref Range   RBC / HPF 0-5 0 - 5 RBC/hpf   WBC, UA NONE SEEN 0 - 5 WBC/hpf   Bacteria, UA RARE (A) NONE SEEN   Squamous Epithelial / LPF 0-5 0 - 5    Comment: Performed at [facility], [address] [place], [place] [address]  Troponin I (High Sensitivity)     Status: None   Collection Time: 05/14/22  7:47 PM  Result Value Ref Range   Troponin I (High Sensitivity) 10 <18 ng/L    Comment: (NOTE) Elevated high sensitivity troponin I (hsTnI) values and significant  changes across serial measurements may suggest ACS but many other  chronic and acute conditions are known to elevate hsTnI results.  Refer to the "Links" section for chest pain algorithms and additional  guidance. Performed at [facility], [address] [place], [place] [address]    CT ABDOMEN PELVIS W CONTRAST  Result Date: 05/14/2022 CLINICAL DATA:  Abdominal pain, acute. Upper abdominal pain beginning this morning with nausea. Question pancreatitis. EXAM: CT ABDOMEN AND PELVIS WITH CONTRAST TECHNIQUE: Multidetector CT imaging of the abdomen and pelvis was performed using the standard protocol following bolus administration of intravenous contrast. RADIATION DOSE REDUCTION: This exam was performed according to the departmental dose-optimization program which includes automated exposure control, adjustment of the mA and/or kV according to patient size and/or use of iterative reconstruction technique. CONTRAST:  143m OMNIPAQUE IOHEXOL 300 MG/ML  SOLN COMPARISON:  PET scan 12/31/2021. CT of the abdomen and pelvis 10/10/2017 FINDINGS: Lower chest: Lung  bases are clear. Heart is upper limits of normal for size. Coronary artery calcifications are present. Hepatobiliary: The liver is  unremarkable. Multiple layering gallstones are present. No inflammatory changes are present. The common bile duct is within normal limits. Pancreas: Unremarkable. No pancreatic ductal dilatation or surrounding inflammatory changes. Spleen: Normal in size without focal abnormality. Adrenals/Urinary Tract: Adrenal glands are within normal limits bilaterally. Kidneys are unremarkable. Ureters are within normal limits bilaterally. Previously noted cyst is seal is no longer evident. The urinary bladder is within normal limits. Stomach/Bowel: The stomach and duodenum are within normal limits. Small bowel is unremarkable. Terminal ileum is within normal limits. Appendix is not discretely visualized and may be surgically absent. The ascending and transverse colon are within normal limits. The descending and rectosigmoid colon are normal. Vascular/Lymphatic: Extensive atherosclerotic calcifications are present the aorta and branch vessels. No aneurysm is present. Reproductive: Status post hysterectomy. No adnexal masses. Previously noted pelvic floor laxity is no longer present. Other: No abdominal wall hernia or abnormality. No abdominopelvic ascites. Musculoskeletal: Mild degenerative changes of the thoracolumbar spine are similar to the prior exam. Mild rightward curvature is centered at L 4. The bony pelvis is normal. The hips are located and within normal limits. IMPRESSION: 1. No acute or focal lesion to explain the patient's symptoms. 2. Cholelithiasis without evidence for cholecystitis. 3. Aortic Atherosclerosis (ICD10-I70.0). Electronically Signed   By: San Morelle M.D.   On: 05/14/2022 16:27    Review of Systems  Gastrointestinal:  Positive for abdominal pain.  All other systems reviewed and are negative.   Blood pressure (!) 174/80, pulse 67, temperature 97.9 F (36.6 C), resp. rate 16, height 5' 6"  (1.676 m), weight 86.2 kg, SpO2 96 %. Physical Exam HENT:     Head: Normocephalic.   Cardiovascular:     Rate and Rhythm: Normal rate and regular rhythm.  Pulmonary:     Effort: Pulmonary effort is normal.     Breath sounds: No stridor.  Abdominal:     General: Abdomen is flat.     Palpations: Abdomen is soft.     Tenderness: There is abdominal tenderness in the right upper quadrant. There is no guarding or rebound.     Hernia: No hernia is present.  Genitourinary:    Rectum: Normal.  Skin:    General: Skin is warm.  Neurological:     General: No focal deficit present.     Mental Status: She is alert.  Psychiatric:        Mood and Affect: Mood normal.      Assessment/Plan Biliary colic vs early cholecystitis - admit for IVF / pain management / abx- labs normal  Check U/S  Cardiac hx/ plavix - needs cards input about these issues  Troponin I elevated - no CP or SOB- can follow and trend for now  Timing of surgery unclear especially with plavix   Primary team to follow up in am    Mild ARF- IV for now - follow   Total time 30 minutes  Turner Daniels, MD 05/14/2022, 9:15 PM

## 2022-05-14 NOTE — Consult Note (Signed)
Initial Consultation Note   [name] ZOX:[id number] DOB: [birth year] PCP: Martinique, Betty G, MD DOA: 05/14/2022 DOS: the patient was seen and examined on 05/14/2022 Primary service: Edison Pace Md, MD  Referring physician: Dr. Brantley Stage with general surgery. Reason for consult: Medical management.  Assessment/Plan: Essential hypertension Resume home oral antihypertensives Monitor vital signs  Possible early acute cholecystitis Imaging inconclusive, may benefit from HIDA scan IV fluid hydration Rest of management per primary team  Type 2 diabetes with hyperglycemia Obtain hemoglobin A1c Hold off home oral hypoglycemics Start insulin sliding scale  Hyperlipidemia Resume home regimen  CKD 3A Appears to be at baseline creatinine Avoid nephrotoxic agents, dehydration and hypotension Monitor urine output  Thrombocytopenia Platelet count 137.  Obesity BMI 30 Recommend weight loss outpatient with regular physical activity and healthy dieting.  Chronic anxiety/depression Resume home regimen   TRH will follow along with you.  Thank you for this consultation.  HPI: Victoria Eaton is a 61 y.o. female with past medical history of essential hypertension, type 2 diabetes, CKD 3, obesity, who presented to [facility] as a ED to ED transfer from [facility] ED due to right upper quadrant abdominal pain. at the time of the visit she feels better.  Abdominal ultrasound revealed findings suggestive of early changes of acute cholecystitis.  Review of Systems: As mentioned in the history of present illness. All other systems reviewed and are negative. Past Medical History:  Diagnosis Date   Anxiety    Arthritis    "maybe in my left foot" (10/14/2017)   Bladder prolapse, female, acquired 09/14/2017   Coronary artery disease    Depression    High cholesterol    History of stomach ulcers    Hypertension    Myocardial infarction ([place]) 12/30/2004   Tobacco abuse    Type II diabetes mellitus ([place])     Past Surgical History:  Procedure Laterality Date   ANTERIOR AND POSTERIOR REPAIR N/A 07/09/2021   Procedure: ANTERIOR (CYSTOCELE) AND POSTERIOR REPAIR (RECTOCELE) with perineorrhaphy;  Surgeon: [name], MD;  [facility];  Service: Gynecology;  Laterality: N/A;   CARDIAC CATHETERIZATION  10/14/2017   CARPAL TUNNEL RELEASE Right 04/10/2021   Procedure: RIGHT CARPAL TUNNEL RELEASE;  Surgeon: Leandrew Koyanagi, MD;  Location: [place];  Service: Orthopedics;  Laterality: Right;   CORONARY ANGIOPLASTY WITH STENT PLACEMENT  12/30/2004   Patient reported   CORONARY ARTERY BYPASS GRAFT N/A 10/19/2017   Procedure: CORONARY ARTERY BYPASS GRAFTING (CABG) times four using the right saphaneous vein. Harvested endoscopicly and left internal mammary artery.;  Surgeon: Grace Isaac, MD;  Location: [place];  Service: Open Heart Surgery;  Laterality: N/A;   CYSTOSCOPY N/A 07/09/2021   Procedure: CYSTOSCOPY;  Surgeon: [name], MD;  [facility];  Service: Gynecology;  Laterality: N/A;   DILATION AND CURETTAGE OF UTERUS  1980s   LEFT HEART CATH AND CORONARY ANGIOGRAPHY N/A 10/14/2017   Procedure: LEFT HEART CATH AND CORONARY ANGIOGRAPHY;  Surgeon: Martinique, Peter M, MD;  Location: [place] [facility];  Service: Cardiovascular;  Laterality: N/A;   LEFT HEART CATH AND CORS/GRAFTS ANGIOGRAPHY N/A 01/01/2018   Procedure: LEFT HEART CATH AND CORS/GRAFTS ANGIOGRAPHY;  Surgeon: Jettie Booze, MD;  Location: [place] [facility];  Service: Cardiovascular;  Laterality: N/A;   LEFT HEART CATH AND CORS/GRAFTS ANGIOGRAPHY N/A 08/16/2018   Procedure: LEFT HEART CATH AND CORS/GRAFTS ANGIOGRAPHY;  Surgeon: Nelva Bush, MD;  Location: [place] [facility];  Service: Cardiovascular;  Laterality: N/A;  PILONIDAL CYST EXCISION  1980s   TEE WITHOUT CARDIOVERSION N/A 10/19/2017   Procedure: TRANSESOPHAGEAL ECHOCARDIOGRAM (TEE);  Surgeon: Grace Isaac, MD;  Location: [place];  Service: Open  Heart Surgery;  Laterality: N/A;   TRIGGER FINGER RELEASE Right 04/10/2021   Procedure: RIGHT LONG FINGER TRIGGER RELEASE;  Surgeon: Leandrew Koyanagi, MD;  Location: [place];  Service: Orthopedics;  Laterality: Right;   VAGINAL HYSTERECTOMY N/A 07/09/2021   Procedure: HYSTERECTOMY VAGINAL;  Surgeon: [name], MD;  [facility];  Service: Gynecology;  Laterality: N/A;  total time requested for all procedures is 3 hours   VAGINAL PROLAPSE REPAIR N/A 07/09/2021   Procedure: VAGINAL VAULT SUSPENSION;  Surgeon: [name], MD;  [facility];  Service: Gynecology;  Laterality: N/A;   Social History:  reports that she has been smoking cigarettes. She started smoking about 50 years ago. She has a 33.75 pack-year smoking history. She has never used smokeless tobacco. She reports current alcohol use of about 7.0 standard drinks of alcohol per week. She reports that she does not use drugs.  Allergies  Allergen Reactions   Januvia [Sitagliptin] Nausea And Vomiting and Other (See Comments)    Pancreatitis (this was in Epic, but patient was unaware of this)    Metformin And Related Diarrhea   Penicillins Other (See Comments)    Unknown childhood reaction Has patient had a PCN reaction causing immediate rash, facial/tongue/throat swelling, SOB or lightheadedness with hypotension: Unknown Has patient had a PCN reaction causing severe rash involving mucus membranes or skin necrosis: Unknown Has patient had a PCN reaction that required hospitalization: Unknown Has patient had a PCN reaction occurring within the last 10 years: No If all of the above answers are "NO", then may proceed with Cephalosporin use.     Family History  Problem Relation Age of Onset   Heart disease Father    Hyperlipidemia Father    Hypertension Father    Colon cancer Neg Hx    Esophageal cancer Neg Hx    Stomach cancer Neg Hx    Rectal cancer Neg Hx     Prior to Admission medications   Medication  Sig Start Date End Date Taking? Authorizing Provider  acetaminophen (TYLENOL) 500 MG tablet Take 1 tablet (500 mg total) by mouth every 6 (six) hours as needed (pain). 06/28/21   [name], MD  amLODipine (NORVASC) 5 MG tablet TAKE ONE TABLET BY MOUTH ONE TIME DAILY 06/26/21   Pixie Casino, MD  aspirin 81 MG EC tablet Take 81 mg by mouth daily.    [provider]  buPROPion (WELLBUTRIN XL) 300 MG 24 hr tablet TAKE ONE TABLET BY MOUTH ONE TIME DAILY 04/28/22   Martinique, Betty G, MD  clopidogrel (PLAVIX) 75 MG tablet Take 1 tablet (75 mg total) by mouth daily. 11/29/21   Martinique, Betty G, MD  Continuous Blood Gluc Sensor (FREESTYLE LIBRE 14 DAY SENSOR) MISC APPLY ONE SENSOR TO THE BACK OF YOUR UPPER ARM. REPLACE EVERY 14 DAYS. 01/17/22   Martinique, Betty G, MD  dapagliflozin propanediol (FARXIGA) 5 MG TABS tablet TAKE ONE TABLET BY MOUTH ONE TIME DAILY BEFORE BREAKFAST 01/03/22   Martinique, Betty G, MD  ezetimibe (ZETIA) 10 MG tablet TAKE ONE TABLET BY MOUTH ONE TIME DAILY 03/06/22   Swinyer, Lanice Schwab, NP  FEROSUL 325 (65 Fe) MG tablet TAKE ONE TABLET BY MOUTH ONE TIME DAILY 03/06/22   Martinique, Betty G, MD  insulin aspart (NOVOLOG FLEXPEN)  100 UNIT/ML FlexPen Inject 5 Units into the skin 3 (three) times daily with meals as needed for high blood sugar.    [provider]  Insulin Glargine (BASAGLAR KWIKPEN) 100 UNIT/ML Inject 53 Units into the skin daily. 01/01/22   Martinique, Betty G, MD  Insulin Pen Needle (ULTICARE MICRO PEN NEEDLES) 32G X 4 MM MISC USE WITH INJECTIONS FOUR TIMES A DAY 10/23/21   Martinique, Betty G, MD  isosorbide mononitrate (IMDUR) 120 MG 24 hr tablet TAKE ONE TABLET BY MOUTH ONE TIME DAILY 03/07/22   Hilty, Nadean Corwin, MD  lisinopril (ZESTRIL) 5 MG tablet TAKE ONE TABLET BY MOUTH ONE TIME DAILY 02/21/22   Martinique, Betty G, MD  loratadine (CLARITIN) 10 MG tablet Take 10 mg by mouth daily.    [provider]  methocarbamol (ROBAXIN) 500 MG tablet Take 1 tablet (500 mg  total) by mouth 2 (two) times daily as needed. 11/07/20   Just, Laurita Quint, FNP  metoprolol tartrate (LOPRESSOR) 50 MG tablet TAKE ONE TABLET BY MOUTH TWICE A DAY 03/06/22   Hilty, Nadean Corwin, MD  Multiple Vitamin (MULTIVITAMIN WITH MINERALS) TABS tablet Take 1 tablet by mouth daily.    [provider]  nitroGLYCERIN (NITROSTAT) 0.4 MG SL tablet Place 0.4 mg under the tongue every 5 (five) minutes as needed for chest pain.    [provider]  Omega-3 Fatty Acids (FISH OIL) 1200 MG CAPS Take 1,200 mg by mouth daily.    [provider]  oxyCODONE (OXY IR/ROXICODONE) 5 MG immediate release tablet Take 1 tablet (5 mg total) by mouth every 4 (four) hours as needed for severe pain. 06/28/21   [name], MD  polyethylene glycol powder (GLYCOLAX/MIRALAX) 17 GM/SCOOP powder Take 17 g by mouth daily. Drink 17g (1 scoop) dissolved in water per day. 06/28/21   [name], MD  predniSONE (STERAPRED UNI-PAK 21 TAB) 10 MG (21) TBPK tablet Take as directed 03/26/22   Leandrew Koyanagi, MD  rosuvastatin (CRESTOR) 40 MG tablet TAKE ONE TABLET BY MOUTH ONE TIME DAILY 12/02/21   Martinique, Betty G, MD  sertraline (ZOLOFT) 100 MG tablet TAKE TWO TABLETS BY MOUTH ONE TIME DAILY 03/06/22   Martinique, Betty G, MD  sulfaSALAzine (AZULFIDINE) 500 MG EC tablet Take 3 tablets (1,500 mg total) by mouth 2 (two) times daily. For 8 weeks 11/27/21 01/22/22  Cirigliano, Vito V, DO  sulfaSALAzine (AZULFIDINE) 500 MG EC tablet Take 2 tablets (1,000 mg total) by mouth 2 (two) times daily. 11/27/21   Cirigliano, Vito V, DO  TRULICITY 1.5 XN/1.7GY SOPN INJECT THE CONTENTS OF ONE PEN UNDER THE SKIN WEEKLY ON THE SAME DAY EACH WEEK 05/12/22   Martinique, Betty G, MD  Turmeric 500 MG CAPS Take 1,000 mg by mouth daily.    [provider]    Physical Exam: Vitals:   05/14/22 1700 05/14/22 1820 05/14/22 1849 05/14/22 1904  BP: (!) 181/65 (!) 186/66 (!) 186/66 (!) 174/80  Pulse: 63 64 63 67  Resp: 19 15 18 16    Temp:    97.9 F (36.6 C)  TempSrc:      SpO2: 96% 95% 98% 96%  Weight:      Height:        Family Communication: None at bedside Primary team communication: Discussed with Dr. Brantley Stage, general surgery.   Thank you very much for involving [place] in the care of your patient.  Author: Kayleen Memos, DO 05/14/2022 9:55 PM  For on call  review [email or web address].

## 2022-05-14 NOTE — ED Provider Notes (Signed)
[facility] Provider Note   CSN: [id number] Arrival date & time: 05/14/22  1511     History  Chief Complaint  Patient presents with   Abdominal Pain    Victoria Eaton is a 61 y.o. female with history to include HTN, TII DM, CAD and NSTEMI s/p CABG, Chron's disease and pancreatitis who presents to the ED for evaluation of epigastric pain that started shortly after eating lunch earlier today. She describes the pain as constant, primarily epigastric but radiating laterally in a band-like pattern and sharp/severe. She states the last time this happened, she was told "one of her enzyme was elevated and she ended up getting a CABG." Upon chart review, patient went to [facility] ED 09/2017 with complaints of epigastric pain and was diagnosed with pancreatitis. Also noted to have elevated troponins of unknown significance and was admitted and ultimately underwent CABG for severe 3-vessel CAD. Echocardiogram done at that time with EF 40%.  She underwent another cardiac cath 01/01/2018 which identified new occulusion of the LAD. Patient is also currently endorsing nausea but denies fevers, chills, chest pain, SOB, vomiting, diarrhea, dysuria.   Abdominal Pain Associated symptoms: nausea   Associated symptoms: no chest pain, no fever and no shortness of breath        Home Medications Prior to Admission medications   Medication Sig Start Date End Date Taking? Authorizing Provider  acetaminophen (TYLENOL) 500 MG tablet Take 1 tablet (500 mg total) by mouth every 6 (six) hours as needed (pain). 06/28/21   [name], MD  amLODipine (NORVASC) 5 MG tablet TAKE ONE TABLET BY MOUTH ONE TIME DAILY 06/26/21   Pixie Casino, MD  aspirin 81 MG EC tablet Take 81 mg by mouth daily.    [provider]  buPROPion (WELLBUTRIN XL) 300 MG 24 hr tablet TAKE ONE TABLET BY MOUTH ONE TIME DAILY 04/28/22   Martinique, Betty G, MD  clopidogrel (PLAVIX) 75 MG tablet Take 1  tablet (75 mg total) by mouth daily. 11/29/21   Martinique, Betty G, MD  Continuous Blood Gluc Sensor (FREESTYLE LIBRE 14 DAY SENSOR) MISC APPLY ONE SENSOR TO THE BACK OF YOUR UPPER ARM. REPLACE EVERY 14 DAYS. 01/17/22   Martinique, Betty G, MD  dapagliflozin propanediol (FARXIGA) 5 MG TABS tablet TAKE ONE TABLET BY MOUTH ONE TIME DAILY BEFORE BREAKFAST 01/03/22   Martinique, Betty G, MD  ezetimibe (ZETIA) 10 MG tablet TAKE ONE TABLET BY MOUTH ONE TIME DAILY 03/06/22   Swinyer, Lanice Schwab, NP  FEROSUL 325 (65 Fe) MG tablet TAKE ONE TABLET BY MOUTH ONE TIME DAILY 03/06/22   Martinique, Betty G, MD  insulin aspart (NOVOLOG FLEXPEN) 100 UNIT/ML FlexPen Inject 5 Units into the skin 3 (three) times daily with meals as needed for high blood sugar.    [provider]  Insulin Glargine (BASAGLAR KWIKPEN) 100 UNIT/ML Inject 53 Units into the skin daily. 01/01/22   Martinique, Betty G, MD  Insulin Pen Needle (ULTICARE MICRO PEN NEEDLES) 32G X 4 MM MISC USE WITH INJECTIONS FOUR TIMES A DAY 10/23/21   Martinique, Betty G, MD  isosorbide mononitrate (IMDUR) 120 MG 24 hr tablet TAKE ONE TABLET BY MOUTH ONE TIME DAILY 03/07/22   Hilty, Nadean Corwin, MD  lisinopril (ZESTRIL) 5 MG tablet TAKE ONE TABLET BY MOUTH ONE TIME DAILY 02/21/22   Martinique, Betty G, MD  loratadine (CLARITIN) 10 MG tablet Take 10 mg by mouth daily.    [provider]  methocarbamol (  ROBAXIN) 500 MG tablet Take 1 tablet (500 mg total) by mouth 2 (two) times daily as needed. 11/07/20   Just, Laurita Quint, FNP  metoprolol tartrate (LOPRESSOR) 50 MG tablet TAKE ONE TABLET BY MOUTH TWICE A DAY 03/06/22   Hilty, Nadean Corwin, MD  Multiple Vitamin (MULTIVITAMIN WITH MINERALS) TABS tablet Take 1 tablet by mouth daily.    [provider]  nitroGLYCERIN (NITROSTAT) 0.4 MG SL tablet Place 0.4 mg under the tongue every 5 (five) minutes as needed for chest pain.    [provider]  Omega-3 Fatty Acids (FISH OIL) 1200 MG CAPS Take 1,200 mg by mouth daily.    [provider]  oxyCODONE (OXY IR/ROXICODONE) 5 MG immediate release tablet Take 1 tablet (5 mg total) by mouth every 4 (four) hours as needed for severe pain. 06/28/21   [name], MD  polyethylene glycol powder (GLYCOLAX/MIRALAX) 17 GM/SCOOP powder Take 17 g by mouth daily. Drink 17g (1 scoop) dissolved in water per day. 06/28/21   [name], MD  predniSONE (STERAPRED UNI-PAK 21 TAB) 10 MG (21) TBPK tablet Take as directed 03/26/22   Leandrew Koyanagi, MD  rosuvastatin (CRESTOR) 40 MG tablet TAKE ONE TABLET BY MOUTH ONE TIME DAILY 12/02/21   Martinique, Betty G, MD  sertraline (ZOLOFT) 100 MG tablet TAKE TWO TABLETS BY MOUTH ONE TIME DAILY 03/06/22   Martin[place], Betty G, MD  sulfaSALAzine (AZULFIDINE) 500 MG EC tablet Take 3 tablets (1,500 mg total) by mouth 2 (two) times daily. For 8 weeks 11/27/21 01/22/22  Cirigliano, Vito V, DO  sulfaSALAzine (AZULFIDINE) 500 MG EC tablet Take 2 tablets (1,000 mg total) by mouth 2 (two) times daily. 11/27/21   Cirigliano, Vito V, DO  TRULICITY 1.5 BE/6.7JQ SOPN INJECT THE CONTENTS OF ONE PEN UNDER THE SKIN WEEKLY ON THE SAME DAY EACH WEEK 05/12/22   Martinique, Betty G, MD  Turmeric 500 MG CAPS Take 1,000 mg by mouth daily.    [provider]      Allergies    Januvia [sitagliptin], Metformin and related, and Penicillins    Review of Systems   Review of Systems  Constitutional:  Negative for fever.  Respiratory:  Negative for shortness of breath.   Cardiovascular:  Negative for chest pain.  Gastrointestinal:  Positive for abdominal pain and nausea.    Physical Exam Updated Vital Signs BP (!) 186/66   Pulse 64   Temp 98.4 F (36.9 C) (Oral)   Resp 15   Ht 5' 6"  (1.676 m)   Wt 86.2 kg   SpO2 95%   BMI 30.67 kg/m  Physical Exam Vitals and nursing note reviewed.  Constitutional:      General: She is not in acute distress.    Appearance: She is not ill-appearing.  HENT:     Head: Atraumatic.  Eyes:     Conjunctiva/sclera: Conjunctivae  normal.  Neck:     Vascular: No JVD.  Cardiovascular:     Rate and Rhythm: Normal rate and regular rhythm.     Pulses: Normal pulses.          Dorsalis pedis pulses are 2+ on the right side and 2+ on the left side.     Heart sounds: No murmur heard. Pulmonary:     Effort: Pulmonary effort is normal. No respiratory distress.     Breath sounds: Normal breath sounds.  Abdominal:     General: Abdomen is flat. There is no distension.  Palpations: Abdomen is soft.     Tenderness: There is abdominal tenderness in the epigastric area. There is no right CVA tenderness or left CVA tenderness. Negative signs include Murphy's sign and McBurney's sign.  Musculoskeletal:        General: Normal range of motion.     Cervical back: Normal range of motion.     Right lower leg: No edema.     Left lower leg: No edema.  Skin:    General: Skin is warm and dry.     Capillary Refill: Capillary refill takes less than 2 seconds.  Neurological:     General: No focal deficit present.     Mental Status: She is alert.  Psychiatric:        Mood and Affect: Mood normal.     ED Results / Procedures / Treatments   Labs (all labs ordered are listed, but only abnormal results are displayed) Labs Reviewed  COMPREHENSIVE METABOLIC PANEL - Abnormal; Notable for the following components:      Result Value   Glucose, Bld 142 (*)    BUN 25 (*)    Creatinine, Ser 1.17 (*)    GFR, Estimated 52 (*)    All other components within normal limits  URINALYSIS, ROUTINE W REFLEX MICROSCOPIC - Abnormal; Notable for the following components:   Glucose, UA >=500 (*)    All other components within normal limits  CBC WITH DIFFERENTIAL/PLATELET - Abnormal; Notable for the following components:   RDW 16.0 (*)    Platelets 137 (*)    All other components within normal limits  URINALYSIS, MICROSCOPIC (REFLEX) - Abnormal; Notable for the following components:   Bacteria, UA RARE (*)    All other components within normal  limits  LIPASE, BLOOD  TROPONIN I (HIGH SENSITIVITY)  TROPONIN I (HIGH SENSITIVITY)    EKG EKG Interpretation  Date/Time:  Wednesday May 14 2022 15:23:50 EDT Ventricular Rate:  68 PR Interval:  196 QRS Duration: 117 QT Interval:  412 QTC Calculation: 439 R Axis:   55 Text Interpretation: Sinus rhythm Nonspecific intraventricular conduction delay Anterior infarct, old Borderline T abnormalities, inferior leads Baseline wander in lead(s) V1 Similar TW changes lead III, nonspecific TW changes Confirmed by Gareth Morgan [phone]) on 05/14/2022 5:36:21 PM  Radiology CT ABDOMEN PELVIS W CONTRAST  Result Date: 05/14/2022 CLINICAL DATA:  Abdominal pain, acute. Upper abdominal pain beginning this morning with nausea. Question pancreatitis. EXAM: CT ABDOMEN AND PELVIS WITH CONTRAST TECHNIQUE: Multidetector CT imaging of the abdomen and pelvis was performed using the standard protocol following bolus administration of intravenous contrast. RADIATION DOSE REDUCTION: This exam was performed according to the departmental dose-optimization program which includes automated exposure control, adjustment of the mA and/or kV according to patient size and/or use of iterative reconstruction technique. CONTRAST:  124m OMNIPAQUE IOHEXOL 300 MG/ML  SOLN COMPARISON:  PET scan 12/31/2021. CT of the abdomen and pelvis 10/10/2017 FINDINGS: Lower chest: Lung bases are clear. Heart is upper limits of normal for size. Coronary artery calcifications are present. Hepatobiliary: The liver is unremarkable. Multiple layering gallstones are present. No inflammatory changes are present. The common bile duct is within normal limits. Pancreas: Unremarkable. No pancreatic ductal dilatation or surrounding inflammatory changes. Spleen: Normal in size without focal abnormality. Adrenals/Urinary Tract: Adrenal glands are within normal limits bilaterally. Kidneys are unremarkable. Ureters are within normal limits bilaterally. Previously  noted cyst is seal is no longer evident. The urinary bladder is within normal limits. Stomach/Bowel: The stomach and duodenum are  within normal limits. Small bowel is unremarkable. Terminal ileum is within normal limits. Appendix is not discretely visualized and may be surgically absent. The ascending and transverse colon are within normal limits. The descending and rectosigmoid colon are normal. Vascular/Lymphatic: Extensive atherosclerotic calcifications are present the aorta and branch vessels. No aneurysm is present. Reproductive: Status post hysterectomy. No adnexal masses. Previously noted pelvic floor laxity is no longer present. Other: No abdominal wall hernia or abnormality. No abdominopelvic ascites. Musculoskeletal: Mild degenerative changes of the thoracolumbar spine are similar to the prior exam. Mild rightward curvature is centered at L 4. The bony pelvis is normal. The hips are located and within normal limits. IMPRESSION: 1. No acute or focal lesion to explain the patient's symptoms. 2. Cholelithiasis without evidence for cholecystitis. 3. Aortic Atherosclerosis (ICD10-I70.0). Electronically Signed   By: San Morelle M.D.   On: 05/14/2022 16:27    Procedures Procedures    Medications Ordered in ED Medications  lactated ringers bolus 1,000 mL (1,000 mLs Intravenous New Bag/Given 05/14/22 1555)  morphine (PF) 4 MG/ML injection 4 mg (4 mg Intravenous Given 05/14/22 1556)  ondansetron (ZOFRAN) injection 4 mg (4 mg Intravenous Given 05/14/22 1556)  iohexol (OMNIPAQUE) 300 MG/ML solution 100 mL (100 mLs Intravenous Contrast Given 05/14/22 1609)    ED Course/ Medical Decision Making/ A&P                           Medical Decision Making Amount and/or Complexity of Data Reviewed Labs: ordered. Radiology: ordered.  Risk Prescription drug management.   Social determinants of health:  Social History   Socioeconomic History   Marital status: Widowed    Spouse name: Glendell Docker    Number of children: 1   Years of education: Not on file   Highest education level: High school graduate  Occupational History   Not on file  Tobacco Use   Smoking status: Some Days    Packs/day: 0.75    Years: 45.00    Total pack years: 33.75    Types: Cigarettes    Start date: 05/01/1972    Last attempt to quit: 09/30/2017    Years since quitting: 4.6   Smokeless tobacco: Never  Vaping Use   Vaping Use: Never used  Substance and Sexual Activity   Alcohol use: Yes    Alcohol/week: 7.0 standard drinks of alcohol    Types: 7 Standard drinks or equivalent per week    Comment: daily: 2-3 wine   Drug use: No   Sexual activity: Not Currently  Other Topics Concern   Not on file  Social History Narrative   Lives with sister   R handed   Caffeine: couple coups of coffee and a bottle of diet coke a day   Social Determinants of Radio broadcast assistant Strain: Not on file  Food Insecurity: Not on file  Transportation Needs: Not on file  Physical Activity: Not on file  Stress: Not on file  Social Connections: Not on file  Intimate Partner Violence: Not on file     Initial impression:  This patient presents to the ED for concern of epigastric pain, this involves an extensive number of treatment options, and is a complaint that carries with it a high risk of complications and morbidity.   Differentials include  but is not limited to AAA, gastroenteritis, appendicitis, Bowel obstruction, Bowel perforation. Gastroparesis, DKA, Hernia, Inflammatory bowel disease, mesenteric ischemia, pancreatitis, peritonitis SBP, volvulus, ACS  Comorbidities  affecting care:  Numerous, refer to HPI   Additional history obtained: Cardiology and admission records from 2019 including cardiac caths and echocardiogram  Lab Tests  I Ordered, reviewed, and interpreted labs and EKG.  The pertinent results include:  Creat 1.17 Lipase normal  Imaging Studies ordered:  I ordered imaging studies  including  CT abdomen and pelvis shows cholelithiasis without cholecystitis I independently visualized and interpreted imaging and I agree with the radiologist interpretation.   EKG: Sinus rhythm  Cardiac Monitoring:  The patient was maintained on a cardiac monitor   Medicines ordered and prescription drug management:  I ordered medication including: LR bolus Morphine 29m Zofran 45m  Reevaluation of the patient after these medicines showed that the patient improved I have reviewed the patients home medicines and have made adjustments as needed   Consultations Obtained:  I requested consultation with general surgery and spoke with Dr. CoBrantley Stage and discussed lab and imaging findings as well as pertinent plan - they recommend: Given that patient is not responsive to medications, he recommends ED to ED [facility] and general surgery will evaluate patient when she arrives   ED Course/Re-evaluation: Patient is overall in no acute distress and is nontoxic-appearing.  She does seem fairly uncomfortable, grimacing in pain and reluctant to change positions as this triggers pain.  Vitals concerning for elevated blood pressure of 198/80, otherwise vitals are without significant abnormality.  She is satting well on room air.  On exam, her abdomen is soft, nondistended and is tender in the epigastric region, slightly worse on the right side but extending across in a bandlike pattern.  Physical exam otherwise benign.  Given that the last time she had similar symptoms, she was diagnosed with gallstone pancreatitis and then subsequently had CABG due to elevated troponins, I did obtain full labs including troponins and CT imaging.  First troponin was 6 and lipase was normal.  She had no leukocytosis and overall labs were without acute findings aside from mild AKI 1.17.  She was given 1 L of fluids along with morphine and Zofran for pain management. On reassessment, patient was not having  any improvement in pain and then actually feels worse now than before she had received the pain medicine.  CT imaging identified cholelithiasis without cholecystitis.  Patient's symptoms consistent with intractable symptomatic cholelithiasis.  I had discussion with patient about discharge versus possible admission for cholecystectomy.  Advised patient that if she goes home now despite pain not resolving with pain medicine, she will likely return due to pain.  Patient is amenable to surgery if surgery believes this is necessary.  I spoke with general surgery who advises transfer over [facility] where they will evaluate patient.  Dr. YaDarl Householdergrees to accept patient.    Disposition:  After consideration of the diagnostic results, physical exam, history and the patients response to treatment feel that the patent would benefit from transfer to [facility].   To medic cholelithiasis: Plan and management as described above. Discharged home in good condition.  Final Clinical Impression(s) / ED Diagnoses Final diagnoses:  Symptomatic cholelithiasis    Rx / DC Orders ED Discharge Orders     None         CoTonye PearsonPA-C 05/14/22 1842    ScGareth MorganMD 05/15/22 2352

## 2022-05-14 NOTE — H&P (Signed)
Victoria Eaton is an 61 y.o. female.   Chief Complaint: abdominal pain HPI: pt sent from [facility] ED with 1 day hx of severe epigastric and RUQ abdominal pain.  No N/V [ain sharp , severe and constant since noon. Meds help some but pain still present. CT shows large GS. Hx of Crohn's but pain is not consistent with her IBD which has been quiet on meds   Past Medical History:  Diagnosis Date   Anxiety    Arthritis    "maybe in my left foot" (10/14/2017)   Bladder prolapse, female, acquired 09/14/2017   Coronary artery disease    Depression    High cholesterol    History of stomach ulcers    Hypertension    Myocardial infarction ([place]) 12/30/2004   Tobacco abuse    Type II diabetes mellitus ([place])     Past Surgical History:  Procedure Laterality Date   ANTERIOR AND POSTERIOR REPAIR N/A 07/09/2021   Procedure: ANTERIOR (CYSTOCELE) AND POSTERIOR REPAIR (RECTOCELE) with perineorrhaphy;  Surgeon: [name], MD;  Location: [facility];  Service: Gynecology;  Laterality: N/A;   CARDIAC CATHETERIZATION  10/14/2017   CARPAL TUNNEL RELEASE Right 04/10/2021   Procedure: RIGHT CARPAL TUNNEL RELEASE;  Surgeon: Leandrew Koyanagi, MD;  Location: [place];  Service: Orthopedics;  Laterality: Right;   CORONARY ANGIOPLASTY WITH STENT PLACEMENT  12/30/2004   Patient reported   CORONARY ARTERY BYPASS GRAFT N/A 10/19/2017   Procedure: CORONARY ARTERY BYPASS GRAFTING (CABG) times four using the right saphaneous vein. Harvested endoscopicly and left internal mammary artery.;  Surgeon: Grace Isaac, MD;  Location: [place];  Service: Open Heart Surgery;  Laterality: N/A;   CYSTOSCOPY N/A 07/09/2021   Procedure: CYSTOSCOPY;  Surgeon: [name], MD;  Location: [facility];  Service: Gynecology;  Laterality: N/A;   DILATION AND CURETTAGE OF UTERUS  1980s   LEFT HEART CATH AND CORONARY ANGIOGRAPHY N/A 10/14/2017   Procedure: LEFT HEART CATH AND CORONARY ANGIOGRAPHY;  Surgeon: Martinique, Peter M, MD;  Location: [place] [facility];  Service: Cardiovascular;  Laterality: N/A;   LEFT HEART CATH AND CORS/GRAFTS ANGIOGRAPHY N/A 01/01/2018   Procedure: LEFT HEART CATH AND CORS/GRAFTS ANGIOGRAPHY;  Surgeon: Jettie Booze, MD;  Location: [place] [facility];  Service: Cardiovascular;  Laterality: N/A;   LEFT HEART CATH AND CORS/GRAFTS ANGIOGRAPHY N/A 08/16/2018   Procedure: LEFT HEART CATH AND CORS/GRAFTS ANGIOGRAPHY;  Surgeon: Nelva Bush, MD;  Location: [place] [facility];  Service: Cardiovascular;  Laterality: N/A;   PILONIDAL CYST EXCISION  1980s   TEE WITHOUT CARDIOVERSION N/A 10/19/2017   Procedure: TRANSESOPHAGEAL ECHOCARDIOGRAM (TEE);  Surgeon: Grace Isaac, MD;  Location: [place];  Service: Open Heart Surgery;  Laterality: N/A;   TRIGGER FINGER RELEASE Right 04/10/2021   Procedure: RIGHT LONG FINGER TRIGGER RELEASE;  Surgeon: Leandrew Koyanagi, MD;  Location: [place];  Service: Orthopedics;  Laterality: Right;   VAGINAL HYSTERECTOMY N/A 07/09/2021   Procedure: HYSTERECTOMY VAGINAL;  Surgeon: [name], MD;  Location[facility];  Service: Gynecology;  Laterality: N/A;  total time requested for all procedures is 3 hours   VAGINAL PROLAPSE REPAIR N/A 07/09/2021   Procedure: VAGINAL VAULT SUSPENSION;  Surgeon: [name], MD;  [facility];  Service: Gynecology;  Laterality: N/A;    Family History  Problem Relation Age of Onset   Heart disease Father    Hyperlipidemia Father    Hypertension Father    Colon cancer Neg Hx  Esophageal cancer Neg Hx    Stomach cancer Neg Hx    Rectal cancer Neg Hx    Social History:  reports that she has been smoking cigarettes. She started smoking about 50 years ago. She has a 33.75 pack-year smoking history. She has never used smokeless tobacco. She reports current alcohol use of about 7.0 standard drinks of alcohol per week. She reports that she does not use drugs.  Allergies:  Allergies  Allergen Reactions   Januvia [Sitagliptin]  Nausea And Vomiting and Other (See Comments)    Pancreatitis (this was in Epic, but patient was unaware of this)    Metformin And Related Diarrhea   Penicillins Other (See Comments)    Unknown childhood reaction Has patient had a PCN reaction causing immediate rash, facial/tongue/throat swelling, SOB or lightheadedness with hypotension: Unknown Has patient had a PCN reaction causing severe rash involving mucus membranes or skin necrosis: Unknown Has patient had a PCN reaction that required hospitalization: Unknown Has patient had a PCN reaction occurring within the last 10 years: No If all of the above answers are "NO", then may proceed with Cephalosporin use.     (Not in a hospital admission)   Results for orders placed or performed during the hospital encounter of 05/14/22 (from the past 48 hour(s))  Lipase, blood     Status: None   Collection Time: 05/14/22  3:34 PM  Result Value Ref Range   Lipase 34 11 - 51 U/L    Comment: Performed at [facility], [address] [place], [place] [address]  Comprehensive metabolic panel     Status: Abnormal   Collection Time: 05/14/22  3:34 PM  Result Value Ref Range   Sodium 139 135 - 145 mmol/L   Potassium 3.6 3.5 - 5.1 mmol/L   Chloride 106 98 - 111 mmol/L   CO2 25 22 - 32 mmol/L   Glucose, Bld 142 (H) 70 - 99 mg/dL    Comment: Glucose reference range applies only to samples taken after fasting for at least 8 hours.   BUN 25 (H) 8 - 23 mg/dL   Creatinine, Ser 1.17 (H) 0.44 - 1.00 mg/dL   Calcium 9.2 8.9 - 10.3 mg/dL   Total Protein 7.4 6.5 - 8.1 g/dL   Albumin 4.0 3.5 - 5.0 g/dL   AST 22 15 - 41 U/L   ALT 19 0 - 44 U/L   Alkaline Phosphatase 50 38 - 126 U/L   Total Bilirubin 0.6 0.3 - 1.2 mg/dL   GFR, Estimated 52 (L) >60 mL/min    Comment: (NOTE) Calculated using the CKD-EPI Creatinine Equation (2021)    Anion gap 8 5 - 15    Comment: Performed at [facility], [address] [place], [place] [address]   CBC with Differential     Status: Abnormal   Collection Time: 05/14/22  3:35 PM  Result Value Ref Range   WBC 6.6 4.0 - 10.5 K/uL   RBC 4.09 3.87 - 5.11 MIL/uL   Hemoglobin 12.0 12.0 - 15.0 g/dL   HCT 37.5 36.0 - 46.0 %   MCV 91.7 80.0 - 100.0 fL   MCH 29.3 26.0 - 34.0 pg   MCHC 32.0 30.0 - 36.0 g/dL   RDW 16.0 (H) 11.5 - 15.5 %   Platelets 137 (L) 150 - 400 K/uL   nRBC 0.0 0.0 - 0.2 %   Neutrophils Relative % 62 %   Neutro Abs 4.1 1.7 -  7.7 K/uL   Lymphocytes Relative 26 %   Lymphs Abs 1.7 0.7 - 4.0 K/uL   Monocytes Relative 8 %   Monocytes Absolute 0.6 0.1 - 1.0 K/uL   Eosinophils Relative 2 %   Eosinophils Absolute 0.2 0.0 - 0.5 K/uL   Basophils Relative 1 %   Basophils Absolute 0.0 0.0 - 0.1 K/uL   Immature Granulocytes 1 %   Abs Immature Granulocytes 0.03 0.00 - 0.07 K/uL    Comment: Performed at [facility], [address] [place], [place] [address]  Troponin I (High Sensitivity)     Status: None   Collection Time: 05/14/22  4:17 PM  Result Value Ref Range   Troponin I (High Sensitivity) 6 <18 ng/L    Comment: (NOTE) Elevated high sensitivity troponin I (hsTnI) values and significant  changes across serial measurements may suggest ACS but many other  chronic and acute conditions are known to elevate hsTnI results.  Refer to the "Links" section for chest pain algorithms and additional  guidance. Performed at [facility], [address] [place], [place] [address]   Urinalysis, Routine w reflex microscopic     Status: Abnormal   Collection Time: 05/14/22  4:50 PM  Result Value Ref Range   Color, Urine YELLOW YELLOW   APPearance CLEAR CLEAR   Specific Gravity, Urine 1.015 1.005 - 1.030   pH 7.0 5.0 - 8.0   Glucose, UA >=500 (A) NEGATIVE mg/dL   Hgb urine dipstick NEGATIVE NEGATIVE   Bilirubin Urine NEGATIVE NEGATIVE   Ketones, ur NEGATIVE NEGATIVE mg/dL   Protein, ur NEGATIVE NEGATIVE mg/dL   Nitrite NEGATIVE NEGATIVE   Leukocytes,Ua  NEGATIVE NEGATIVE    Comment: Performed at [facility], [address] [place], [place] [address]  Urinalysis, Microscopic (reflex)     Status: Abnormal   Collection Time: 05/14/22  4:50 PM  Result Value Ref Range   RBC / HPF 0-5 0 - 5 RBC/hpf   WBC, UA NONE SEEN 0 - 5 WBC/hpf   Bacteria, UA RARE (A) NONE SEEN   Squamous Epithelial / LPF 0-5 0 - 5    Comment: Performed at [facility], [address] [place], [place] [address]  Troponin I (High Sensitivity)     Status: None   Collection Time: 05/14/22  7:47 PM  Result Value Ref Range   Troponin I (High Sensitivity) 10 <18 ng/L    Comment: (NOTE) Elevated high sensitivity troponin I (hsTnI) values and significant  changes across serial measurements may suggest ACS but many other  chronic and acute conditions are known to elevate hsTnI results.  Refer to the "Links" section for chest pain algorithms and additional  guidance. Performed at [facility], [address], [place], [place] [address]    CT ABDOMEN PELVIS W CONTRAST  Result Date: 05/14/2022 CLINICAL DATA:  Abdominal pain, acute. Upper abdominal pain beginning this morning with nausea. Question pancreatitis. EXAM: CT ABDOMEN AND PELVIS WITH CONTRAST TECHNIQUE: Multidetector CT imaging of the abdomen and pelvis was performed using the standard protocol following bolus administration of intravenous contrast. RADIATION DOSE REDUCTION: This exam was performed according to the departmental dose-optimization program which includes automated exposure control, adjustment of the mA and/or kV according to patient size and/or use of iterative reconstruction technique. CONTRAST:  11m OMNIPAQUE IOHEXOL 300 MG/ML  SOLN COMPARISON:  PET scan 12/31/2021. CT of the abdomen and pelvis 10/10/2017 FINDINGS: Lower chest: Lung  bases are clear. Heart is upper limits of normal for size. Coronary artery calcifications are present. Hepatobiliary: The liver is  unremarkable. Multiple layering gallstones are present. No inflammatory changes are present. The common bile duct is within normal limits. Pancreas: Unremarkable. No pancreatic ductal dilatation or surrounding inflammatory changes. Spleen: Normal in size without focal abnormality. Adrenals/Urinary Tract: Adrenal glands are within normal limits bilaterally. Kidneys are unremarkable. Ureters are within normal limits bilaterally. Previously noted cyst is seal is no longer evident. The urinary bladder is within normal limits. Stomach/Bowel: The stomach and duodenum are within normal limits. Small bowel is unremarkable. Terminal ileum is within normal limits. Appendix is not discretely visualized and may be surgically absent. The ascending and transverse colon are within normal limits. The descending and rectosigmoid colon are normal. Vascular/Lymphatic: Extensive atherosclerotic calcifications are present the aorta and branch vessels. No aneurysm is present. Reproductive: Status post hysterectomy. No adnexal masses. Previously noted pelvic floor laxity is no longer present. Other: No abdominal wall hernia or abnormality. No abdominopelvic ascites. Musculoskeletal: Mild degenerative changes of the thoracolumbar spine are similar to the prior exam. Mild rightward curvature is centered at L 4. The bony pelvis is normal. The hips are located and within normal limits. IMPRESSION: 1. No acute or focal lesion to explain the patient's symptoms. 2. Cholelithiasis without evidence for cholecystitis. 3. Aortic Atherosclerosis (ICD10-I70.0). Electronically Signed   By: San Morelle M.D.   On: 05/14/2022 16:27    Review of Systems  Gastrointestinal:  Positive for abdominal pain.  All other systems reviewed and are negative.   Blood pressure (!) 174/80, pulse 67, temperature 97.9 F (36.6 C), resp. rate 16, height 5' 6"  (1.676 m), weight 86.2 kg, SpO2 96 %. Physical Exam HENT:     Head: Normocephalic.   Cardiovascular:     Rate and Rhythm: Normal rate and regular rhythm.  Pulmonary:     Effort: Pulmonary effort is normal.     Breath sounds: No stridor.  Abdominal:     General: Abdomen is flat.     Palpations: Abdomen is soft.     Tenderness: There is abdominal tenderness in the right upper quadrant. There is no guarding or rebound.     Hernia: No hernia is present.  Genitourinary:    Rectum: Normal.  Skin:    General: Skin is warm.  Neurological:     General: No focal deficit present.     Mental Status: She is alert.  Psychiatric:        Mood and Affect: Mood normal.      Assessment/Plan Biliary colic vs early cholecystitis - admit for IVF / pain management / abx- labs normal  Check U/S  Cardiac hx/ plavix - needs cards input about these issues  Troponin I elevated - no CP or SOB- can follow and trend for now  Timing of surgery unclear especially with plavix   Primary team to follow up in am    Mild ARF- IV for now - follow   Total time 30 minutes  Turner Daniels, MD 05/14/2022, 9:15 PM

## 2022-05-14 NOTE — ED Notes (Signed)
Doctor at bedside.

## 2022-05-14 NOTE — ED Notes (Signed)
Pt moved to room 7 to have US done

## 2022-05-14 NOTE — ED Triage Notes (Signed)
Patient c/o upper abd pain x this am with nausea.

## 2022-05-15 ENCOUNTER — Encounter (HOSPITAL_COMMUNITY): Payer: Self-pay

## 2022-05-15 DIAGNOSIS — K8050 Calculus of bile duct without cholangitis or cholecystitis without obstruction: Secondary | ICD-10-CM | POA: Diagnosis not present

## 2022-05-15 LAB — CBC
HCT: 35.6 % — ABNORMAL LOW (ref 36.0–46.0)
HCT: 38.4 % (ref 36.0–46.0)
Hemoglobin: 11.4 g/dL — ABNORMAL LOW (ref 12.0–15.0)
Hemoglobin: 11.7 g/dL — ABNORMAL LOW (ref 12.0–15.0)
MCH: 29.9 pg (ref 26.0–34.0)
MCH: 29.3 pg (ref 26.0–34.0)
MCHC: 32 g/dL (ref 30.0–36.0)
MCHC: 30.5 g/dL (ref 30.0–36.0)
MCV: 93.4 fL (ref 80.0–100.0)
MCV: 96.2 fL (ref 80.0–100.0)
Platelets: 124 10*3/uL — ABNORMAL LOW (ref 150–400)
Platelets: 132 10*3/uL — ABNORMAL LOW (ref 150–400)
RBC: 3.81 MIL/uL — ABNORMAL LOW (ref 3.87–5.11)
RBC: 3.99 MIL/uL (ref 3.87–5.11)
RDW: 15.9 % — ABNORMAL HIGH (ref 11.5–15.5)
RDW: 16.1 % — ABNORMAL HIGH (ref 11.5–15.5)
WBC: 7.1 10*3/uL (ref 4.0–10.5)
WBC: 5.9 10*3/uL (ref 4.0–10.5)
nRBC: 0 % (ref 0.0–0.2)
nRBC: 0 % (ref 0.0–0.2)

## 2022-05-15 LAB — COMPREHENSIVE METABOLIC PANEL
ALT: 16 U/L (ref 0–44)
ALT: 16 U/L (ref 0–44)
AST: 21 U/L (ref 15–41)
AST: 17 U/L (ref 15–41)
Albumin: 3.6 g/dL (ref 3.5–5.0)
Albumin: 3.8 g/dL (ref 3.5–5.0)
Alkaline Phosphatase: 43 U/L (ref 38–126)
Alkaline Phosphatase: 46 U/L (ref 38–126)
Anion gap: 6 (ref 5–15)
Anion gap: 5 (ref 5–15)
BUN: 19 mg/dL (ref 8–23)
BUN: 16 mg/dL (ref 8–23)
CO2: 26 mmol/L (ref 22–32)
CO2: 27 mmol/L (ref 22–32)
Calcium: 9.2 mg/dL (ref 8.9–10.3)
Calcium: 9.2 mg/dL (ref 8.9–10.3)
Chloride: 107 mmol/L (ref 98–111)
Chloride: 109 mmol/L (ref 98–111)
Creatinine, Ser: 0.98 mg/dL (ref 0.44–1.00)
Creatinine, Ser: 0.91 mg/dL (ref 0.44–1.00)
GFR, Estimated: >60 mL/min (ref >60)
GFR, Estimated: >60 mL/min (ref >60)
Glucose, Bld: 82 mg/dL (ref 70–99)
Glucose, Bld: 82 mg/dL (ref 70–99)
Potassium: 3.6 mmol/L (ref 3.5–5.1)
Potassium: 3.9 mmol/L (ref 3.5–5.1)
Sodium: 139 mmol/L (ref 135–145)
Sodium: 141 mmol/L (ref 135–145)
Total Bilirubin: 0.5 mg/dL (ref 0.3–1.2)
Total Bilirubin: 0.5 mg/dL (ref 0.3–1.2)
Total Protein: 7 g/dL (ref 6.5–8.1)
Total Protein: 6.7 g/dL (ref 6.5–8.1)

## 2022-05-15 LAB — HIV ANTIBODY (ROUTINE TESTING W REFLEX): HIV Screen 4th Generation wRfx: NONREACTIVE

## 2022-05-15 LAB — GLUCOSE, CAPILLARY
Glucose-Capillary: 85 mg/dL (ref 70–99)
Glucose-Capillary: 148 mg/dL — ABNORMAL HIGH (ref 70–99)

## 2022-05-15 LAB — CBG MONITORING, ED
Glucose-Capillary: 87 mg/dL (ref 70–99)
Glucose-Capillary: 85 mg/dL (ref 70–99)
Glucose-Capillary: 143 mg/dL — ABNORMAL HIGH (ref 70–99)

## 2022-05-15 LAB — MAGNESIUM: Magnesium: 1.9 mg/dL (ref 1.7–2.4)

## 2022-05-15 MED ORDER — ORAL CARE MOUTH RINSE: 15.0000 mL | OROMUCOSAL | Status: DC | PRN

## 2022-05-15 MED ORDER — ISOSORBIDE MONONITRATE ER 60 MG PO TB24
120.0000 mg | ORAL_TABLET | Freq: Every day | ORAL | Status: DC
  Administered 2022-05-16: 120 mg via ORAL
  Filled 2022-05-15 (×2): qty 2

## 2022-05-15 MED ORDER — LIP MEDEX EX OINT
TOPICAL_OINTMENT | Freq: Two times a day (BID) | CUTANEOUS | Status: DC
  Administered 2022-05-16: 1 via TOPICAL
  Filled 2022-05-15: qty 7

## 2022-05-15 MED ORDER — SODIUM CHLORIDE 0.9 % IV SOLN: 8.0000 mg | Freq: Four times a day (QID) | INTRAVENOUS | Status: DC | PRN

## 2022-05-15 MED ORDER — SULFASALAZINE 500 MG PO TBEC
1000.0000 mg | DELAYED_RELEASE_TABLET | Freq: Two times a day (BID) | ORAL | Status: DC
  Administered 2022-05-15: 1000 mg via ORAL
  Filled 2022-05-15 (×2): qty 2

## 2022-05-15 MED ORDER — DIPHENHYDRAMINE HCL 50 MG/ML IJ SOLN: 12.5000 mg | Freq: Four times a day (QID) | INTRAMUSCULAR | Status: DC | PRN

## 2022-05-15 MED ORDER — BASAGLAR KWIKPEN 100 UNIT/ML ~~LOC~~ SOPN: 53.0000 [IU] | PEN_INJECTOR | Freq: Every day | SUBCUTANEOUS | Status: DC

## 2022-05-15 MED ORDER — ENOXAPARIN SODIUM 40 MG/0.4ML IJ SOSY
40.0000 mg | PREFILLED_SYRINGE | INTRAMUSCULAR | Status: DC
  Administered 2022-05-15 – 2022-05-16 (×2): 40 mg via SUBCUTANEOUS
  Filled 2022-05-15 (×2): qty 0.4

## 2022-05-15 MED ORDER — METOPROLOL TARTRATE 5 MG/5ML IV SOLN: 5.0000 mg | Freq: Four times a day (QID) | INTRAVENOUS | Status: DC | PRN

## 2022-05-15 MED ORDER — METOPROLOL TARTRATE 50 MG PO TABS
50.0000 mg | ORAL_TABLET | Freq: Two times a day (BID) | ORAL | Status: DC
  Administered 2022-05-15 – 2022-05-16 (×3): 50 mg via ORAL
  Filled 2022-05-15 (×2): qty 2
  Filled 2022-05-15 (×2): qty 1

## 2022-05-15 MED ORDER — MELATONIN 5 MG PO TABS: 5.0000 mg | ORAL_TABLET | Freq: Every evening | ORAL | Status: DC | PRN

## 2022-05-15 MED ORDER — MENTHOL 3 MG MT LOZG: 1.0000 | LOZENGE | OROMUCOSAL | Status: DC | PRN

## 2022-05-15 MED ORDER — SIMETHICONE 40 MG/0.6ML PO SUSP: 80.0000 mg | Freq: Four times a day (QID) | ORAL | Status: DC | PRN

## 2022-05-15 MED ORDER — LISINOPRIL 5 MG PO TABS
5.0000 mg | ORAL_TABLET | Freq: Every day | ORAL | Status: DC
Start: 2022-05-15 — End: 2022-05-16
  Administered 2022-05-15 – 2022-05-16 (×2): 5 mg via ORAL
  Filled 2022-05-15 (×2): qty 1

## 2022-05-15 MED ORDER — PROCHLORPERAZINE EDISYLATE 10 MG/2ML IJ SOLN: 5.0000 mg | INTRAMUSCULAR | Status: DC | PRN

## 2022-05-15 MED ORDER — METHOCARBAMOL 500 MG PO TABS: 500.0000 mg | ORAL_TABLET | Freq: Three times a day (TID) | ORAL | Status: DC | PRN

## 2022-05-15 MED ORDER — INSULIN ASPART 100 UNIT/ML IJ SOLN
0.0000 [IU] | INTRAMUSCULAR | Status: DC
  Administered 2022-05-15: 2 [IU] via SUBCUTANEOUS
  Filled 2022-05-15: qty 0.09

## 2022-05-15 MED ORDER — SERTRALINE HCL 25 MG PO TABS
25.0000 mg | ORAL_TABLET | Freq: Every day | ORAL | Status: DC
  Administered 2022-05-15: 25 mg via ORAL
  Filled 2022-05-15: qty 1

## 2022-05-15 MED ORDER — EZETIMIBE 10 MG PO TABS
10.0000 mg | ORAL_TABLET | Freq: Every day | ORAL | Status: DC
  Administered 2022-05-16: 10 mg via ORAL
  Filled 2022-05-15 (×2): qty 1

## 2022-05-15 MED ORDER — SULFASALAZINE 500 MG PO TBEC
1500.0000 mg | DELAYED_RELEASE_TABLET | Freq: Two times a day (BID) | ORAL | Status: DC
  Administered 2022-05-15 – 2022-05-16 (×2): 1500 mg via ORAL
  Filled 2022-05-15 (×4): qty 3

## 2022-05-15 MED ORDER — ALUM & MAG HYDROXIDE-SIMETH 200-200-20 MG/5ML PO SUSP: 30.0000 mL | Freq: Four times a day (QID) | ORAL | Status: DC | PRN

## 2022-05-15 MED ORDER — BUPROPION HCL ER (XL) 300 MG PO TB24
300.0000 mg | ORAL_TABLET | Freq: Every day | ORAL | Status: DC
Start: 2022-05-15 — End: 2022-05-16
  Administered 2022-05-15 – 2022-05-16 (×2): 300 mg via ORAL
  Filled 2022-05-15: qty 1
  Filled 2022-05-15: qty 2

## 2022-05-15 MED ORDER — AMLODIPINE BESYLATE 5 MG PO TABS
5.0000 mg | ORAL_TABLET | Freq: Every day | ORAL | Status: DC
  Administered 2022-05-15 – 2022-05-16 (×2): 5 mg via ORAL
  Filled 2022-05-15 (×2): qty 1

## 2022-05-15 MED ORDER — LACTATED RINGERS IV BOLUS: 1000.0000 mL | Freq: Three times a day (TID) | INTRAVENOUS | Status: DC | PRN

## 2022-05-15 MED ORDER — ACETAMINOPHEN 325 MG PO TABS: 650.0000 mg | ORAL_TABLET | Freq: Four times a day (QID) | ORAL | Status: DC | PRN

## 2022-05-15 MED ORDER — PHENOL 1.4 % MT LIQD: 2.0000 | OROMUCOSAL | Status: DC | PRN

## 2022-05-15 MED ORDER — ACETAMINOPHEN 500 MG PO TABS
500.0000 mg | ORAL_TABLET | Freq: Four times a day (QID) | ORAL | Status: DC | PRN
  Administered 2022-05-15: 500 mg via ORAL
  Filled 2022-05-15: qty 1

## 2022-05-15 MED ORDER — ROSUVASTATIN CALCIUM 20 MG PO TABS
40.0000 mg | ORAL_TABLET | Freq: Every day | ORAL | Status: DC
  Administered 2022-05-15 – 2022-05-16 (×2): 40 mg via ORAL
  Filled 2022-05-15 (×3): qty 2

## 2022-05-15 MED ORDER — ONDANSETRON HCL 4 MG/2ML IJ SOLN: 4.0000 mg | Freq: Four times a day (QID) | INTRAMUSCULAR | Status: DC | PRN

## 2022-05-15 MED ORDER — SODIUM CHLORIDE 0.9 % IV SOLN: INTRAVENOUS | Status: DC

## 2022-05-15 MED ORDER — ACETAMINOPHEN 650 MG RE SUPP: 650.0000 mg | Freq: Four times a day (QID) | RECTAL | Status: DC | PRN

## 2022-05-15 MED ORDER — METHOCARBAMOL 1000 MG/10ML IJ SOLN: 500.0000 mg | Freq: Three times a day (TID) | INTRAVENOUS | Status: DC | PRN

## 2022-05-15 MED ORDER — POLYETHYLENE GLYCOL 3350 17 G PO PACK: 17.0000 g | PACK | Freq: Every day | ORAL | Status: DC | PRN

## 2022-05-15 MED ORDER — ZOLPIDEM TARTRATE 5 MG PO TABS: 5.0000 mg | ORAL_TABLET | Freq: Every evening | ORAL | Status: DC | PRN

## 2022-05-15 MED ORDER — HYDROMORPHONE HCL 2 MG/ML IJ SOLN: 0.5000 mg | INTRAMUSCULAR | Status: DC | PRN

## 2022-05-15 MED ORDER — INSULIN ASPART 100 UNIT/ML IJ SOLN
0.0000 [IU] | Freq: Three times a day (TID) | INTRAMUSCULAR | Status: DC
  Administered 2022-05-15 – 2022-05-16 (×2): 2 [IU] via SUBCUTANEOUS
  Filled 2022-05-15: qty 0.15

## 2022-05-15 MED ORDER — ONDANSETRON HCL 4 MG/2ML IJ SOLN
4.0000 mg | Freq: Four times a day (QID) | INTRAMUSCULAR | Status: DC | PRN
Start: 2022-05-15 — End: 2022-05-15

## 2022-05-15 MED ORDER — CALCIUM POLYCARBOPHIL 625 MG PO TABS
625.0000 mg | ORAL_TABLET | Freq: Two times a day (BID) | ORAL | Status: DC
  Administered 2022-05-15 – 2022-05-16 (×2): 625 mg via ORAL
  Filled 2022-05-15 (×3): qty 1

## 2022-05-15 MED ORDER — ONDANSETRON 4 MG PO TBDP: 4.0000 mg | ORAL_TABLET | Freq: Four times a day (QID) | ORAL | Status: DC | PRN

## 2022-05-15 MED ORDER — ACETAMINOPHEN 325 MG PO TABS
325.0000 mg | ORAL_TABLET | Freq: Four times a day (QID) | ORAL | Status: DC | PRN
  Administered 2022-05-15 – 2022-05-16 (×2): 650 mg via ORAL
  Filled 2022-05-15 (×2): qty 2

## 2022-05-15 MED ORDER — TRAMADOL HCL 50 MG PO TABS: 50.0000 mg | ORAL_TABLET | Freq: Four times a day (QID) | ORAL | Status: DC | PRN

## 2022-05-15 MED ORDER — LACTATED RINGERS IV SOLN
INTRAVENOUS | Status: AC
Start: 2022-05-15 — End: 2022-05-16

## 2022-05-15 MED ORDER — NITROGLYCERIN 0.4 MG SL SUBL
0.4000 mg | SUBLINGUAL_TABLET | SUBLINGUAL | Status: DC | PRN
Start: 2022-05-15 — End: 2022-05-16

## 2022-05-15 MED ORDER — DAPAGLIFLOZIN PROPANEDIOL 5 MG PO TABS: 5.0000 mg | ORAL_TABLET | Freq: Every day | ORAL | Status: DC

## 2022-05-15 MED ORDER — PROCHLORPERAZINE EDISYLATE 10 MG/2ML IJ SOLN: 10.0000 mg | Freq: Four times a day (QID) | INTRAMUSCULAR | Status: DC | PRN

## 2022-05-15 MED ORDER — INSULIN ASPART 100 UNIT/ML IJ SOLN
5.0000 [IU] | Freq: Three times a day (TID) | INTRAMUSCULAR | Status: DC | PRN
Start: 2022-05-15 — End: 2022-05-16
  Filled 2022-05-15 (×3): qty 0.05

## 2022-05-15 MED ORDER — MAGIC MOUTHWASH: 15.0000 mL | Freq: Four times a day (QID) | ORAL | Status: DC | PRN

## 2022-05-15 NOTE — Plan of Care (Signed)
  Problem: Activity: Goal: Risk for activity intolerance will decrease Outcome: Progressing   Problem: Coping: Goal: Level of anxiety will decrease Outcome: Progressing   Problem: Pain Managment: Goal: General experience of comfort will improve Outcome: Progressing   

## 2022-05-15 NOTE — Progress Notes (Signed)
PROGRESS NOTE    Victoria Eaton  IWO:[id number] DOB: 20-Oct-1960 DOA: 05/14/2022 PCP: Martinique, Betty G, MD    Brief Narrative:  61 year old female with history of essential hypertension, type 2 diabetes on insulin, stage III chronic kidney disease, history of coronary artery disease status post CABG in 2019 presented to emergency room with severe acute onset right upper quadrant abdominal pain, she was found to have early changes of acute cholecystitis and was transferred to [facility] long hospital with surgical admit.  Symptomatically treated and already stabilizing. Following for comanagement.   Assessment & Plan:   Symptomatic cholelithiasis: As per surgery.  On clears.  Pain managed.  Stabilizing now.  Surgery planning for lap chole probably after 5 days of Plavix washout.  Coronary artery disease status post CABG: Chart reviewed.  Coronary stent in 2006.  CABG in 2019.  Medically managed since then. Multiple surgical procedures tolerated after CABG. No recent chest pain or angina. She can safely hold Plavix for 5 days, last dose taken 8/23 for surgery and go back on it as soon as possible from hemostasis standpoint. She is optimized on beta-blockers, statin, lisinopril and nitrates.  That she will continue.  Essential hypertension: Blood pressure stable on home medications.  AKI: Improved.  Maintenance fluid.  Monitor renal functions.  Type 2 diabetes: Well-controlled.  Sliding scale insulin when she does not have any reliable oral intake.  Thank you for involving [place] in comanagement of this patient.  We will continue to follow along with you.   DVT prophylaxis: enoxaparin (LOVENOX) injection 40 mg Start: 05/15/22 0600 SCDs Start: 05/15/22 0521   Code Status: Full code Family Communication: None Disposition Plan: Status is: Inpatient Remains inpatient appropriate because: Inpatient surgical procedure planned.     Consultants:  TRH  Procedures:  None  Antimicrobials:   None   Subjective: Patient seen and examined.  No overnight events.  Abdominal pain improved since she had arrived to the emergency room.  Currently denies any nausea or vomiting.  Objective: Vitals:   05/15/22 0700 05/15/22 0800 05/15/22 0956 05/15/22 1100  BP: (!) 157/70 (!) 164/75  (!) 172/70  Pulse: (!) 59 63  66  Resp:  18  16  Temp:   98.3 F (36.8 C)   TempSrc:   Oral   SpO2: 95% 93%  94%  Weight:      Height:       No intake or output data in the 24 hours ending 05/15/22 1201 Filed Weights   05/14/22 1516  Weight: 86.2 kg    Examination:  General exam: Appears calm and comfortable  Respiratory system: Clear to auscultation. Respiratory effort normal.  No added sounds Cardiovascular system: S1 & S2 heard, RRR. No pedal edema. Gastrointestinal system: soft . Nontender.  Bowel sound present.   Central nervous system: Alert and oriented. No focal neurological deficits. Extremities: Symmetric 5 x 5 power. Skin: No rashes, lesions or ulcers Psychiatry: Judgement and insight appear normal. Mood & affect appropriate.     Data Reviewed: I have personally reviewed following labs and imaging studies  CBC: Recent Labs  Lab 05/14/22 1535 05/15/22 0335  WBC 6.6 7.1  NEUTROABS 4.1  --   HGB 12.0 11.4*  HCT 37.5 35.6*  MCV 91.7 93.4  PLT 137* 500*   Basic Metabolic Panel: Recent Labs  Lab 05/14/22 1534 05/15/22 0335  NA 139 139  K 3.6 3.6  CL 106 107  CO2 25 26  GLUCOSE 142* 82  BUN 25* 19  CREATININE 1.17* 0.98  CALCIUM 9.2 9.2  MG  --  1.9   GFR: Estimated Creatinine Clearance: 65 mL/min (by C-G formula based on SCr of 0.98 mg/dL). Liver Function Tests: Recent Labs  Lab 05/14/22 1534 05/15/22 0335  AST 22 21  ALT 19 16  ALKPHOS 50 43  BILITOT 0.6 0.5  PROT 7.4 7.0  ALBUMIN 4.0 3.6   Recent Labs  Lab 05/14/22 1534  LIPASE 34   No results for input(s): "AMMONIA" in the last 168 hours. Coagulation Profile: No results for input(s):  "INR", "PROTIME" in the last 168 hours. Cardiac Enzymes: No results for input(s): "CKTOTAL", "CKMB", "CKMBINDEX", "TROPONINI" in the last 168 hours. BNP (last 3 results) No results for input(s): "PROBNP" in the last 8760 hours. HbA1C: No results for input(s): "HGBA1C" in the last 72 hours. CBG: Recent Labs  Lab 05/15/22 0318 05/15/22 0757 05/15/22 1145  GLUCAP 87 85 143*   Lipid Profile: No results for input(s): "CHOL", "HDL", "LDLCALC", "TRIG", "CHOLHDL", "LDLDIRECT" in the last 72 hours. Thyroid Function Tests: No results for input(s): "TSH", "T4TOTAL", "FREET4", "T3FREE", "THYROIDAB" in the last 72 hours. Anemia Panel: No results for input(s): "VITAMINB12", "FOLATE", "FERRITIN", "TIBC", "IRON", "RETICCTPCT" in the last 72 hours. Sepsis Labs: No results for input(s): "PROCALCITON", "LATICACIDVEN" in the last 168 hours.  No results found for this or any previous visit (from the past 240 hour(s)).       Radiology Studies: US ABDOMEN LIMITED RUQ (LIVER/GB)  Result Date: 05/15/2022 CLINICAL DATA:  Cholelithiasis EXAM: ULTRASOUND ABDOMEN LIMITED RIGHT UPPER QUADRANT COMPARISON:  05/14/2022 FINDINGS: Gallbladder: There are mobile layering gallstones and sludge within the gallbladder. The gallbladder is not distended. There is mild gallbladder wall thickening and pericholecystic fluid identified. The gallbladder wall is thickened up to 7 mm in thickness. The sonographic Percell Miller sign is reportedly negative. Common bile duct: Diameter: 5 mm in proximal diameter Liver: No focal lesion identified. Within normal limits in parenchymal echogenicity. Portal vein is patent on color Doppler imaging with normal direction of blood flow towards the liver. Other: None. IMPRESSION: Cholelithiasis with gallbladder wall thickening and pericholecystic fluid. These findings may reflect early changes of acute cholecystitis and correlation with liver enzymes would be helpful. If there is clinical concern for  acute cholecystitis, nuclear medicine hepatobiliary scan may be obtained for further evaluation. Electronically Signed   By: Fidela Salisbury M.D.   On: 05/15/2022 00:03   CT ABDOMEN PELVIS W CONTRAST  Result Date: 05/14/2022 CLINICAL DATA:  Abdominal pain, acute. Upper abdominal pain beginning this morning with nausea. Question pancreatitis. EXAM: CT ABDOMEN AND PELVIS WITH CONTRAST TECHNIQUE: Multidetector CT imaging of the abdomen and pelvis was performed using the standard protocol following bolus administration of intravenous contrast. RADIATION DOSE REDUCTION: This exam was performed according to the departmental dose-optimization program which includes automated exposure control, adjustment of the mA and/or kV according to patient size and/or use of iterative reconstruction technique. CONTRAST:  142m OMNIPAQUE IOHEXOL 300 MG/ML  SOLN COMPARISON:  PET scan 12/31/2021. CT of the abdomen and pelvis 10/10/2017 FINDINGS: Lower chest: Lung bases are clear. Heart is upper limits of normal for size. Coronary artery calcifications are present. Hepatobiliary: The liver is unremarkable. Multiple layering gallstones are present. No inflammatory changes are present. The common bile duct is within normal limits. Pancreas: Unremarkable. No pancreatic ductal dilatation or surrounding inflammatory changes. Spleen: Normal in size without focal abnormality. Adrenals/Urinary Tract: Adrenal glands are within normal limits bilaterally. Kidneys are unremarkable. Ureters are within normal limits  bilaterally. Previously noted cyst is seal is no longer evident. The urinary bladder is within normal limits. Stomach/Bowel: The stomach and duodenum are within normal limits. Small bowel is unremarkable. Terminal ileum is within normal limits. Appendix is not discretely visualized and may be surgically absent. The ascending and transverse colon are within normal limits. The descending and rectosigmoid colon are normal.  Vascular/Lymphatic: Extensive atherosclerotic calcifications are present the aorta and branch vessels. No aneurysm is present. Reproductive: Status post hysterectomy. No adnexal masses. Previously noted pelvic floor laxity is no longer present. Other: No abdominal wall hernia or abnormality. No abdominopelvic ascites. Musculoskeletal: Mild degenerative changes of the thoracolumbar spine are similar to the prior exam. Mild rightward curvature is centered at L 4. The bony pelvis is normal. The hips are located and within normal limits. IMPRESSION: 1. No acute or focal lesion to explain the patient's symptoms. 2. Cholelithiasis without evidence for cholecystitis. 3. Aortic Atherosclerosis (ICD10-I70.0). Electronically Signed   By: San Morelle M.D.   On: 05/14/2022 16:27        Scheduled Meds:  amLODipine  5 mg Oral Daily   buPROPion  300 mg Oral Daily   enoxaparin (LOVENOX) injection  40 mg Subcutaneous Q24H   ezetimibe  10 mg Oral Daily   insulin aspart  0-15 Units Subcutaneous TID WC   insulin aspart  0-9 Units Subcutaneous Q4H   isosorbide mononitrate  120 mg Oral Daily   lip balm   Topical BID   lisinopril  5 mg Oral Daily   metoprolol tartrate  50 mg Oral BID   polycarbophil  625 mg Oral BID   rosuvastatin  40 mg Oral Daily   sertraline  25 mg Oral QHS   sulfaSALAzine  1,500 mg Oral BID   Continuous Infusions:  sodium chloride 75 mL/hr at 05/15/22 0444   lactated ringers     lactated ringers Stopped (05/15/22 0431)   methocarbamol (ROBAXIN) IV     ondansetron (ZOFRAN) IV       LOS: 1 day    Time spent: 35 minutes     Barb Merino, MD [facility] Pager [phone]

## 2022-05-15 NOTE — Progress Notes (Signed)
Subjective: Pain improved so far today.  No other complaints.  LD of plavix 8/23 am  ROS: See above, otherwise other systems negative  Objective: Vital signs in last 24 hours: Temp:  [97.6 F (36.4 C)-98.6 F (37 C)] 97.6 F (36.4 C) (08/24 0611) Pulse Rate:  [54-89] 63 (08/24 0800) Resp:  [15-20] 18 (08/24 0800) BP: (151-214)/(65-82) 164/75 (08/24 0800) SpO2:  [88 %-98 %] 93 % (08/24 0800) Weight:  [86.2 kg] 86.2 kg (08/23 1516)    Intake/Output from previous day: No intake/output data recorded. Intake/Output this shift: No intake/output data recorded.  PE: Gen: NAD Heart: regular Lungs: nonlabored Abd: soft, minimally tender in RUQ, ND  Lab Results:  Recent Labs    05/14/22 1535 05/15/22 0335  WBC 6.6 7.1  HGB 12.0 11.4*  HCT 37.5 35.6*  PLT 137* 124*   BMET Recent Labs    05/14/22 1534 05/15/22 0335  NA 139 139  K 3.6 3.6  CL 106 107  CO2 25 26  GLUCOSE 142* 82  BUN 25* 19  CREATININE 1.17* 0.98  CALCIUM 9.2 9.2   PT/INR No results for input(s): "LABPROT", "INR" in the last 72 hours. CMP     Component Value Date/Time   NA 139 05/15/2022 0335   NA 143 10/31/2021 1528   K 3.6 05/15/2022 0335   CL 107 05/15/2022 0335   CO2 26 05/15/2022 0335   GLUCOSE 82 05/15/2022 0335   BUN 19 05/15/2022 0335   BUN 18 10/31/2021 1528   CREATININE 0.98 05/15/2022 0335   CALCIUM 9.2 05/15/2022 0335   PROT 7.0 05/15/2022 0335   PROT 7.5 03/01/2020 1201   ALBUMIN 3.6 05/15/2022 0335   ALBUMIN 4.2 03/01/2020 1201   AST 21 05/15/2022 0335   ALT 16 05/15/2022 0335   ALKPHOS 43 05/15/2022 0335   BILITOT 0.5 05/15/2022 0335   BILITOT 0.3 03/01/2020 1201   GFRNONAA >60 05/15/2022 0335   GFRAA 53 (L) 03/01/2020 1201   Lipase     Component Value Date/Time   LIPASE 34 05/14/2022 1534       Studies/Results: US ABDOMEN LIMITED RUQ (LIVER/GB)  Result Date: 05/15/2022 CLINICAL DATA:  Cholelithiasis EXAM: ULTRASOUND ABDOMEN LIMITED RIGHT UPPER  QUADRANT COMPARISON:  05/14/2022 FINDINGS: Gallbladder: There are mobile layering gallstones and sludge within the gallbladder. The gallbladder is not distended. There is mild gallbladder wall thickening and pericholecystic fluid identified. The gallbladder wall is thickened up to 7 mm in thickness. The sonographic Percell Miller sign is reportedly negative. Common bile duct: Diameter: 5 mm in proximal diameter Liver: No focal lesion identified. Within normal limits in parenchymal echogenicity. Portal vein is patent on color Doppler imaging with normal direction of blood flow towards the liver. Other: None. IMPRESSION: Cholelithiasis with gallbladder wall thickening and pericholecystic fluid. These findings may reflect early changes of acute cholecystitis and correlation with liver enzymes would be helpful. If there is clinical concern for acute cholecystitis, nuclear medicine hepatobiliary scan may be obtained for further evaluation. Electronically Signed   By: Fidela Salisbury M.D.   On: 05/15/2022 00:03   CT ABDOMEN PELVIS W CONTRAST  Result Date: 05/14/2022 CLINICAL DATA:  Abdominal pain, acute. Upper abdominal pain beginning this morning with nausea. Question pancreatitis. EXAM: CT ABDOMEN AND PELVIS WITH CONTRAST TECHNIQUE: Multidetector CT imaging of the abdomen and pelvis was performed using the standard protocol following bolus administration of intravenous contrast. RADIATION DOSE REDUCTION: This exam was performed according to the departmental dose-optimization program which  includes automated exposure control, adjustment of the mA and/or kV according to patient size and/or use of iterative reconstruction technique. CONTRAST:  188m OMNIPAQUE IOHEXOL 300 MG/ML  SOLN COMPARISON:  PET scan 12/31/2021. CT of the abdomen and pelvis 10/10/2017 FINDINGS: Lower chest: Lung bases are clear. Heart is upper limits of normal for size. Coronary artery calcifications are present. Hepatobiliary: The liver is unremarkable.  Multiple layering gallstones are present. No inflammatory changes are present. The common bile duct is within normal limits. Pancreas: Unremarkable. No pancreatic ductal dilatation or surrounding inflammatory changes. Spleen: Normal in size without focal abnormality. Adrenals/Urinary Tract: Adrenal glands are within normal limits bilaterally. Kidneys are unremarkable. Ureters are within normal limits bilaterally. Previously noted cyst is seal is no longer evident. The urinary bladder is within normal limits. Stomach/Bowel: The stomach and duodenum are within normal limits. Small bowel is unremarkable. Terminal ileum is within normal limits. Appendix is not discretely visualized and may be surgically absent. The ascending and transverse colon are within normal limits. The descending and rectosigmoid colon are normal. Vascular/Lymphatic: Extensive atherosclerotic calcifications are present the aorta and branch vessels. No aneurysm is present. Reproductive: Status post hysterectomy. No adnexal masses. Previously noted pelvic floor laxity is no longer present. Other: No abdominal wall hernia or abnormality. No abdominopelvic ascites. Musculoskeletal: Mild degenerative changes of the thoracolumbar spine are similar to the prior exam. Mild rightward curvature is centered at L 4. The bony pelvis is normal. The hips are located and within normal limits. IMPRESSION: 1. No acute or focal lesion to explain the patient's symptoms. 2. Cholelithiasis without evidence for cholecystitis. 3. Aortic Atherosclerosis (ICD10-I70.0). Electronically Signed   By: CSan MorelleM.D.   On: 05/14/2022 16:27    Anti-infectives: Anti-infectives (From admission, onward)    None        Assessment/Plan Early cholecystitis, biliary colic -hold plavix for 5 days prior to surgery  -may have CLD today -currently not on abx therapy -appreciate medical assistance with this patient.   FEN - CLD/IVFs VTE - plavix on hold, SCDs,  lovenox ID - none currently  HTN DM HLD CKD Chronic anxiety/depression  I reviewed Consultant hospitalist notes, last 24 h vitals and pain scores, last 48 h intake and output, last 24 h labs and trends, and last 24 h imaging results.   LOS: 1 day    KHenreitta Cea, [facility] 05/15/2022, 8:58 AM Please see Amion for pager number during day hours 7:00am-4:30pm or 7:00am -11:30am on weekends

## 2022-05-16 DIAGNOSIS — K8050 Calculus of bile duct without cholangitis or cholecystitis without obstruction: Secondary | ICD-10-CM | POA: Diagnosis not present

## 2022-05-16 LAB — GLUCOSE, CAPILLARY
Glucose-Capillary: 120 mg/dL — ABNORMAL HIGH (ref 70–99)
Glucose-Capillary: 121 mg/dL — ABNORMAL HIGH (ref 70–99)

## 2022-05-16 MED ORDER — TRAMADOL HCL 50 MG PO TABS: 50.0000 mg | ORAL_TABLET | Freq: Four times a day (QID) | ORAL | 0 refills | Status: AC | PRN

## 2022-05-16 NOTE — Progress Notes (Signed)
Subjective: No further pain.  Tolerating CLD with no issues.  LD of plavix 8/23 am  ROS: See above, otherwise other systems negative  Objective: Vital signs in last 24 hours: Temp:  [97.7 F (36.5 C)-98.3 F (36.8 C)] 98.3 F (36.8 C) (08/25 1027) Pulse Rate:  [56-94] 94 (08/25 1027) Resp:  [15-18] 18 (08/25 1027) BP: (118-176)/(59-103) 139/86 (08/25 1027) SpO2:  [92 %-98 %] 92 % (08/25 1027) Last BM Date : 05/15/22  Intake/Output from previous day: 08/24 0701 - 08/25 0700 In: 1800.4 [I.V.:1800.4] Out: -  Intake/Output this shift: Total I/O In: 240 [P.O.:240] Out: -   PE: Gen: NAD Heart: regular Lungs: nonlabored Abd: soft, NT in RUQ, ND  Lab Results:  Recent Labs    05/15/22 0335 05/15/22 1425  WBC 7.1 5.9  HGB 11.4* 11.7*  HCT 35.6* 38.4  PLT 124* 132*   BMET Recent Labs    05/15/22 0335 05/15/22 1425  NA 139 141  K 3.6 3.9  CL 107 109  CO2 26 27  GLUCOSE 82 82  BUN 19 16  CREATININE 0.98 0.91  CALCIUM 9.2 9.2   PT/INR No results for input(s): "LABPROT", "INR" in the last 72 hours. CMP     Component Value Date/Time   NA 141 05/15/2022 1425   NA 143 10/31/2021 1528   K 3.9 05/15/2022 1425   CL 109 05/15/2022 1425   CO2 27 05/15/2022 1425   GLUCOSE 82 05/15/2022 1425   BUN 16 05/15/2022 1425   BUN 18 10/31/2021 1528   CREATININE 0.91 05/15/2022 1425   CALCIUM 9.2 05/15/2022 1425   PROT 6.7 05/15/2022 1425   PROT 7.5 03/01/2020 1201   ALBUMIN 3.8 05/15/2022 1425   ALBUMIN 4.2 03/01/2020 1201   AST 17 05/15/2022 1425   ALT 16 05/15/2022 1425   ALKPHOS 46 05/15/2022 1425   BILITOT 0.5 05/15/2022 1425   BILITOT 0.3 03/01/2020 1201   GFRNONAA >60 05/15/2022 1425   GFRAA 53 (L) 03/01/2020 1201   Lipase     Component Value Date/Time   LIPASE 34 05/14/2022 1534       Studies/Results: US ABDOMEN LIMITED RUQ (LIVER/GB)  Result Date: 05/15/2022 CLINICAL DATA:  Cholelithiasis EXAM: ULTRASOUND ABDOMEN LIMITED RIGHT UPPER  QUADRANT COMPARISON:  05/14/2022 FINDINGS: Gallbladder: There are mobile layering gallstones and sludge within the gallbladder. The gallbladder is not distended. There is mild gallbladder wall thickening and pericholecystic fluid identified. The gallbladder wall is thickened up to 7 mm in thickness. The sonographic Percell Miller sign is reportedly negative. Common bile duct: Diameter: 5 mm in proximal diameter Liver: No focal lesion identified. Within normal limits in parenchymal echogenicity. Portal vein is patent on color Doppler imaging with normal direction of blood flow towards the liver. Other: None. IMPRESSION: Cholelithiasis with gallbladder wall thickening and pericholecystic fluid. These findings may reflect early changes of acute cholecystitis and correlation with liver enzymes would be helpful. If there is clinical concern for acute cholecystitis, nuclear medicine hepatobiliary scan may be obtained for further evaluation. Electronically Signed   By: Fidela Salisbury M.D.   On: 05/15/2022 00:03   CT ABDOMEN PELVIS W CONTRAST  Result Date: 05/14/2022 CLINICAL DATA:  Abdominal pain, acute. Upper abdominal pain beginning this morning with nausea. Question pancreatitis. EXAM: CT ABDOMEN AND PELVIS WITH CONTRAST TECHNIQUE: Multidetector CT imaging of the abdomen and pelvis was performed using the standard protocol following bolus administration of intravenous contrast. RADIATION DOSE REDUCTION: This exam was performed according to  the departmental dose-optimization program which includes automated exposure control, adjustment of the mA and/or kV according to patient size and/or use of iterative reconstruction technique. CONTRAST:  158m OMNIPAQUE IOHEXOL 300 MG/ML  SOLN COMPARISON:  PET scan 12/31/2021. CT of the abdomen and pelvis 10/10/2017 FINDINGS: Lower chest: Lung bases are clear. Heart is upper limits of normal for size. Coronary artery calcifications are present. Hepatobiliary: The liver is unremarkable.  Multiple layering gallstones are present. No inflammatory changes are present. The common bile duct is within normal limits. Pancreas: Unremarkable. No pancreatic ductal dilatation or surrounding inflammatory changes. Spleen: Normal in size without focal abnormality. Adrenals/Urinary Tract: Adrenal glands are within normal limits bilaterally. Kidneys are unremarkable. Ureters are within normal limits bilaterally. Previously noted cyst is seal is no longer evident. The urinary bladder is within normal limits. Stomach/Bowel: The stomach and duodenum are within normal limits. Small bowel is unremarkable. Terminal ileum is within normal limits. Appendix is not discretely visualized and may be surgically absent. The ascending and transverse colon are within normal limits. The descending and rectosigmoid colon are normal. Vascular/Lymphatic: Extensive atherosclerotic calcifications are present the aorta and branch vessels. No aneurysm is present. Reproductive: Status post hysterectomy. No adnexal masses. Previously noted pelvic floor laxity is no longer present. Other: No abdominal wall hernia or abnormality. No abdominopelvic ascites. Musculoskeletal: Mild degenerative changes of the thoracolumbar spine are similar to the prior exam. Mild rightward curvature is centered at L 4. The bony pelvis is normal. The hips are located and within normal limits. IMPRESSION: 1. No acute or focal lesion to explain the patient's symptoms. 2. Cholelithiasis without evidence for cholecystitis. 3. Aortic Atherosclerosis (ICD10-I70.0). Electronically Signed   By: CSan MorelleM.D.   On: 05/14/2022 16:27    Anti-infectives: Anti-infectives (From admission, onward)    None        Assessment/Plan Biliary colic -hold plavix for 5 days prior to surgery  -adv to low fat diet today.  If tolerates this with no issues will discuss discharge and return Monday for outpatient lap chole. -currently not on abx therapy -appreciate  medical assistance with this patient.   FEN - low fat diet/SLIV VTE - plavix on hold, SCDs, lovenox ID - none currently  HTN DM HLD CKD Chronic anxiety/depression  I reviewed Consultant hospitalist notes, last 24 h vitals and pain scores, last 48 h intake and output, last 24 h labs and trends, and last 24 h imaging results.   LOS: 1 day    KHenreitta Cea, [facility] 05/16/2022, 10:43 AM Please see Amion for pager number during day hours 7:00am-4:30pm or 7:00am -11:30am on weekends

## 2022-05-16 NOTE — Plan of Care (Signed)
  Problem: Education: Goal: Knowledge of General Education information will improve Description Including pain rating scale, medication(s)/side effects and non-pharmacologic comfort measures Outcome: Progressing   

## 2022-05-16 NOTE — Consult Note (Signed)
CONSULT NOTE    Victoria Eaton  CXK:[id number]  DOB: [birth year]  DOA: 05/14/2022 PCP: Martinique, Betty G, MD Outpatient Specialists:   Hospital course:  61 year old female with history of essential hypertension, type 2 diabetes on insulin, stage III chronic kidney disease, history of coronary artery disease status post CABG in 2019 presented to emergency room with severe acute onset right upper quadrant abdominal pain, she was found to have early changes of acute cholecystitis and was transferred to [facility] with surgical admit.  Symptomatically treated and already stabilizing.  Following for comanagement  Subjective:  Patient feels that she is doing well.  Is looking forward to trying to eat something and hoping to go home and come back for surgery as discussed with surgery earlier today.  She has no complaints.   Objective: Vitals:   05/16/22 0508 05/16/22 1027 05/16/22 1050 05/16/22 1433  BP: (!) 166/59 139/86 121/68 138/64  Pulse: (!) 56 94 (!) 59 (!) 57  Resp: 18 18    Temp: 98.2 F (36.8 C) 98.3 F (36.8 C) 98.6 F (37 C) 98.6 F (37 C)  TempSrc: Oral  Oral Oral  SpO2: 93% 92% 95% 97%  Weight:      Height:        Intake/Output Summary (Last 24 hours) at 05/16/2022 1703 Last data filed at 05/16/2022 1400 Gross per 24 hour  Intake 1908.75 ml  Output --  Net 1908.75 ml   Filed Weights   05/14/22 1516  Weight: 86.2 kg     Exam:  General: Relatively well-appearing female in good spirits sitting up in NAD chatting on the phone. Eyes: sclera anicteric, conjuctiva mild injection bilaterally CVS: S1-S2, regular  Respiratory:  decreased air entry bilaterally secondary to decreased inspiratory effort, rales at bases  GI: NABS, soft, NT  LE: No edema.  Neuro: A/O x 3, Moving all extremities equally with normal strength, CN 3-12 intact, grossly nonfocal.  Psych: patient is logical and coherent, judgement and insight appear normal, mood and affect  appropriate to situation.   Assessment & Plan:   Symptomatic cholelithiasis Plan for trial of p.o. food intake and if tolerated without further nausea or vomiting, plan is to discharge home to complete 5-day Plavix washout and return for CCY.  AKI Resolved with fluid resuscitation  HTN Well managed on present regimen  DM 2 With good control on present regimen May need to restart basal insulin once patient starts eating again  CAD s/p CABG Patient without any chest pain or evidence of ACS Plavix being held, last dose 823 Continue beta-blocker, lisinopril, nitrates and statin   DVT prophylaxis: Lovenox Code Status: Full   Scheduled Meds:  amLODipine  5 mg Oral Daily   buPROPion  300 mg Oral Daily   enoxaparin (LOVENOX) injection  40 mg Subcutaneous Q24H   ezetimibe  10 mg Oral Daily   insulin aspart  0-15 Units Subcutaneous TID WC   isosorbide mononitrate  120 mg Oral Daily   lip balm   Topical BID   lisinopril  5 mg Oral Daily   metoprolol tartrate  50 mg Oral BID   polycarbophil  625 mg Oral BID   rosuvastatin  40 mg Oral Daily   sertraline  25 mg Oral QHS   sulfaSALAzine  1,500 mg Oral BID   Continuous Infusions:  sodium chloride Stopped (05/16/22 1101)   lactated ringers     methocarbamol (ROBAXIN) IV     ondansetron (ZOFRAN) IV  Data Reviewed:  Basic Metabolic Panel: Recent Labs  Lab 05/14/22 1534 05/15/22 0335 05/15/22 1425  NA 139 139 141  K 3.6 3.6 3.9  CL 106 107 109  CO2 25 26 27   GLUCOSE 142* 82 82  BUN 25* 19 16  CREATININE 1.17* 0.98 0.91  CALCIUM 9.2 9.2 9.2  MG  --  1.9  --     CBC: Recent Labs  Lab 05/14/22 1535 05/15/22 0335 05/15/22 1425  WBC 6.6 7.1 5.9  NEUTROABS 4.1  --   --   HGB 12.0 11.4* 11.7*  HCT 37.5 35.6* 38.4  MCV 91.7 93.4 96.2  PLT 137* 124* 132*    Studies: US ABDOMEN LIMITED RUQ (LIVER/GB)  Result Date: 05/15/2022 CLINICAL DATA:  Cholelithiasis EXAM: ULTRASOUND ABDOMEN LIMITED RIGHT UPPER  QUADRANT COMPARISON:  05/14/2022 FINDINGS: Gallbladder: There are mobile layering gallstones and sludge within the gallbladder. The gallbladder is not distended. There is mild gallbladder wall thickening and pericholecystic fluid identified. The gallbladder wall is thickened up to 7 mm in thickness. The sonographic Percell Miller sign is reportedly negative. Common bile duct: Diameter: 5 mm in proximal diameter Liver: No focal lesion identified. Within normal limits in parenchymal echogenicity. Portal vein is patent on color Doppler imaging with normal direction of blood flow towards the liver. Other: None. IMPRESSION: Cholelithiasis with gallbladder wall thickening and pericholecystic fluid. These findings may reflect early changes of acute cholecystitis and correlation with liver enzymes would be helpful. If there is clinical concern for acute cholecystitis, nuclear medicine hepatobiliary scan may be obtained for further evaluation. Electronically Signed   By: Fidela Salisbury M.D.   On: 05/15/2022 00:03    Principal Problem:   Biliary colic     Dewaine Oats Derek Jack, [facility]  If 7PM-7AM, please contact night-coverage [email or web address]   LOS: 1 day

## 2022-05-16 NOTE — Progress Notes (Signed)
Transition of Care [facility]) Screening Note  Patient Details  Name: Desera Graffeo Date of Birth: [birth year]  Transition of Care [facility]) CM/SW Contact:    Sherie Don, LCSW Phone Number: 05/16/2022, 9:32 AM  Transition of Care Department [facility]) has reviewed patient and no TOC needs have been identified at this time. We will continue to monitor patient advancement through interdisciplinary progression rounds. If new patient transition needs arise, please place a TOC consult.

## 2022-05-16 NOTE — Discharge Instructions (Signed)
Please come to [facility] pre op on Monday as instructed. Do not eat or drink anything starting midnight the night prior.

## 2022-05-16 NOTE — Progress Notes (Signed)
Pt alert and oriented. Needs addressed. No questions regarding discharge instructions. Belongings sent home with pt.

## 2022-05-17 ENCOUNTER — Encounter (HOSPITAL_COMMUNITY): Payer: Self-pay

## 2022-05-18 NOTE — Anesthesia Preprocedure Evaluation (Signed)
Anesthesia Evaluation  Patient identified by MRN, date of birth, ID band Patient awake    Reviewed: Allergy & Precautions, NPO status , Patient's Chart, lab work & pertinent test results  Airway Mallampati: III  TM Distance: >3 FB Neck ROM: Full    Dental  (+) Teeth Intact, Dental Advisory Given   Pulmonary Current Smoker (last cigg last night) and Patient abstained from smoking.,  Current smoker, 35 pack year history, 5 cigg/d Has sleep study scheduled in future   Pulmonary exam normal breath sounds clear to auscultation       Cardiovascular hypertension, Pt. on medications + CAD, + Past MI, + Cardiac Stents (2006) and + CABG (2019)  Normal cardiovascular exam Rhythm:Regular Rate:Normal  Post cabg echo - Left ventricle: Septal apical and mid/apical inferior wall  hypokinesis. The cavity size was moderately dilated. Wall  thickness was increased in a pattern of moderate LVH. Systolic  function was normal. The estimated ejection fraction was 40%.  Wall motion was normal; there were no regional wall motion  abnormalities. Left ventricular diastolic function parameters  were normal.  - Mitral valve: There was mild regurgitation.  - Left atrium: The atrium was mildly dilated.  - Atrial septum: A patent foramen ovale cannot be excluded.    After CABG in Jan 2019, needed cath again in April and November  Cath 07/2018: Conclusions: 1. Severe native coronary artery disease.  Since the last catheterization in April, the distal LAD is now occluded (supplied by patent SVG).  Otherwise, there has been no significant interval change, though distal LAD disease appears slightly less pronounced on today's angiogram. 2. Patent LIMA-LAD and SVG-RCA. 3. Chronically occluded SVG-D1 and SVG-OM1. 4. Normal left ventricular systolic function and filling pressure.  Recommendations: 1. Continue medical therapy and aggressive secondary  prevention. 2. PCI to CTO of OM1 could be considered (as previously discussed in Dr. Hassell Done cath report n 01/01/2018) if refractory symptoms are noted.    Neuro/Psych PSYCHIATRIC DISORDERS Anxiety Depression negative neurological ROS     GI/Hepatic negative GI ROS, (+)       alcohol use,   Endo/Other  diabetes, Well Controlled, Type 2, Insulin DependentFS 124 this AM Took 1/2 dose long acting (26 units)  Renal/GU negative Renal ROS  negative genitourinary   Musculoskeletal  (+) Arthritis , Osteoarthritis,    Abdominal   Peds  Hematology  (+) Blood dyscrasia, anemia , Hb 11.7, plt 132   Anesthesia Other Findings   Reproductive/Obstetrics negative OB ROS                            Anesthesia Physical Anesthesia Plan  ASA: 3  Anesthesia Plan: General   Post-op Pain Management: Tylenol PO (pre-op)*, Ketamine IV* and Dilaudid IV   Induction: Intravenous  PONV Risk Score and Plan: 3 and Ondansetron, Dexamethasone, Midazolam and Treatment may vary due to age or medical condition  Airway Management Planned: Oral ETT  Additional Equipment: None  Intra-op Plan:   Post-operative Plan: Extubation in OR  Informed Consent: I have reviewed the patients History and Physical, chart, labs and discussed the procedure including the risks, benefits and alternatives for the proposed anesthesia with the patient or authorized representative who has indicated his/her understanding and acceptance.     Dental advisory given  Plan Discussed with: CRNA  Anesthesia Plan Comments: (Airway for hysterectomy 2022: Ventilation: Mask ventilation without difficulty Laryngoscope Size: Miller and 2 Grade View: Grade II Tube type:  Oral Number of attempts: 1  Significant CAD, still smoking- d/w pt at high risk of perioperative cardiovascular events, inc MI- will keep MAPs>65)      Anesthesia Quick Evaluation

## 2022-05-19 ENCOUNTER — Encounter (HOSPITAL_COMMUNITY): Payer: Self-pay | Admitting: Surgery

## 2022-05-19 ENCOUNTER — Other Ambulatory Visit: Payer: Self-pay

## 2022-05-19 ENCOUNTER — Ambulatory Visit (HOSPITAL_COMMUNITY): Payer: Commercial Managed Care - HMO

## 2022-05-19 ENCOUNTER — Ambulatory Visit (HOSPITAL_BASED_OUTPATIENT_CLINIC_OR_DEPARTMENT_OTHER): Payer: Commercial Managed Care - HMO | Admitting: Certified Registered Nurse Anesthetist

## 2022-05-19 ENCOUNTER — Ambulatory Visit (HOSPITAL_COMMUNITY)
Admission: RE | Admit: 2022-05-19 | Discharge: 2022-05-19 | Disposition: A | Discharge status: Home / Self Care | Payer: Commercial Managed Care - HMO | Source: Ambulatory Visit | Attending: Surgery | Admitting: Surgery

## 2022-05-19 ENCOUNTER — Ambulatory Visit (HOSPITAL_COMMUNITY): Payer: Commercial Managed Care - HMO | Admitting: Certified Registered Nurse Anesthetist

## 2022-05-19 ENCOUNTER — Encounter (HOSPITAL_COMMUNITY)
Admission: RE | Disposition: A | Discharge status: Home / Self Care | Payer: Self-pay | Source: Ambulatory Visit | Attending: Surgery

## 2022-05-19 DIAGNOSIS — K8018 Calculus of gallbladder with other cholecystitis without obstruction: Secondary | ICD-10-CM

## 2022-05-19 DIAGNOSIS — Z794 Long term (current) use of insulin: Secondary | ICD-10-CM | POA: Insufficient documentation

## 2022-05-19 DIAGNOSIS — K5090 Crohn's disease, unspecified, without complications: Secondary | ICD-10-CM | POA: Insufficient documentation

## 2022-05-19 DIAGNOSIS — F17210 Nicotine dependence, cigarettes, uncomplicated: Secondary | ICD-10-CM | POA: Insufficient documentation

## 2022-05-19 DIAGNOSIS — E119 Type 2 diabetes mellitus without complications: Secondary | ICD-10-CM | POA: Insufficient documentation

## 2022-05-19 DIAGNOSIS — I10 Essential (primary) hypertension: Secondary | ICD-10-CM | POA: Insufficient documentation

## 2022-05-19 DIAGNOSIS — R109 Unspecified abdominal pain: Secondary | ICD-10-CM | POA: Diagnosis present

## 2022-05-19 DIAGNOSIS — I2510 Atherosclerotic heart disease of native coronary artery without angina pectoris: Secondary | ICD-10-CM

## 2022-05-19 DIAGNOSIS — Z7902 Long term (current) use of antithrombotics/antiplatelets: Secondary | ICD-10-CM | POA: Insufficient documentation

## 2022-05-19 DIAGNOSIS — I252 Old myocardial infarction: Secondary | ICD-10-CM | POA: Insufficient documentation

## 2022-05-19 DIAGNOSIS — Z955 Presence of coronary angioplasty implant and graft: Secondary | ICD-10-CM | POA: Diagnosis not present

## 2022-05-19 DIAGNOSIS — K8064 Calculus of gallbladder and bile duct with chronic cholecystitis without obstruction: Secondary | ICD-10-CM | POA: Insufficient documentation

## 2022-05-19 DIAGNOSIS — Z951 Presence of aortocoronary bypass graft: Secondary | ICD-10-CM | POA: Diagnosis not present

## 2022-05-19 HISTORY — PX: CHOLECYSTECTOMY: SHX55

## 2022-05-19 LAB — GLUCOSE, CAPILLARY
Glucose-Capillary: 124 mg/dL — ABNORMAL HIGH (ref 70–99)
Glucose-Capillary: 133 mg/dL — ABNORMAL HIGH (ref 70–99)

## 2022-05-19 SURGERY — LAPAROSCOPIC CHOLECYSTECTOMY
Anesthesia: General

## 2022-05-19 MED ORDER — ORAL CARE MOUTH RINSE
15.0000 mL | Freq: Once | OROMUCOSAL | Status: AC
Start: 2022-05-19 — End: 2022-05-19

## 2022-05-19 MED ORDER — IOPAMIDOL (ISOVUE-300) INJECTION 61%
INTRAVENOUS | Status: DC | PRN
  Administered 2022-05-19: 5 mL

## 2022-05-19 MED ORDER — GLYCOPYRROLATE 0.2 MG/ML IJ SOLN
INTRAMUSCULAR | Status: DC | PRN
  Administered 2022-05-19: .2 mg via INTRAVENOUS

## 2022-05-19 MED ORDER — CEFAZOLIN SODIUM-DEXTROSE 2-4 GM/100ML-% IV SOLN
2.0000 g | INTRAVENOUS | Status: AC
  Administered 2022-05-19: 2 g via INTRAVENOUS

## 2022-05-19 MED ORDER — LIDOCAINE 2% (20 MG/ML) 5 ML SYRINGE
INTRAMUSCULAR | Status: DC | PRN
  Administered 2022-05-19: 60 mg via INTRAVENOUS

## 2022-05-19 MED ORDER — ACETAMINOPHEN 500 MG PO TABS
1000.0000 mg | ORAL_TABLET | ORAL | Status: AC
  Administered 2022-05-19: 1000 mg via ORAL
  Filled 2022-05-19: qty 2

## 2022-05-19 MED ORDER — CHLORHEXIDINE GLUCONATE 0.12 % MT SOLN
15.0000 mL | Freq: Once | OROMUCOSAL | Status: AC
  Administered 2022-05-19: 15 mL via OROMUCOSAL

## 2022-05-19 MED ORDER — KETOROLAC TROMETHAMINE 30 MG/ML IJ SOLN
INTRAMUSCULAR | Status: DC | PRN
  Administered 2022-05-19: 15 mg via INTRAVENOUS

## 2022-05-19 MED ORDER — PROPOFOL 10 MG/ML IV BOLUS
INTRAVENOUS | Status: AC
  Filled 2022-05-19: qty 20

## 2022-05-19 MED ORDER — LACTATED RINGERS IV SOLN: INTRAVENOUS | Status: DC

## 2022-05-19 MED ORDER — FENTANYL CITRATE (PF) 100 MCG/2ML IJ SOLN
INTRAMUSCULAR | Status: AC
  Filled 2022-05-19: qty 2

## 2022-05-19 MED ORDER — KETAMINE HCL 10 MG/ML IJ SOLN
INTRAMUSCULAR | Status: DC | PRN
  Administered 2022-05-19: 20 mg via INTRAVENOUS
  Administered 2022-05-19: 10 mg via INTRAVENOUS

## 2022-05-19 MED ORDER — KETOROLAC TROMETHAMINE 30 MG/ML IJ SOLN
INTRAMUSCULAR | Status: AC
  Filled 2022-05-19: qty 1

## 2022-05-19 MED ORDER — MIDAZOLAM HCL 5 MG/5ML IJ SOLN
INTRAMUSCULAR | Status: DC | PRN
  Administered 2022-05-19: 2 mg via INTRAVENOUS

## 2022-05-19 MED ORDER — CHLORHEXIDINE GLUCONATE CLOTH 2 % EX PADS: 6.0000 | MEDICATED_PAD | Freq: Once | CUTANEOUS | Status: DC

## 2022-05-19 MED ORDER — BUPIVACAINE-EPINEPHRINE 0.25% -1:200000 IJ SOLN
INTRAMUSCULAR | Status: DC | PRN
  Administered 2022-05-19: 15 mL

## 2022-05-19 MED ORDER — ACETAMINOPHEN 500 MG PO TABS: 1000.0000 mg | ORAL_TABLET | Freq: Once | ORAL | Status: DC

## 2022-05-19 MED ORDER — SUGAMMADEX SODIUM 200 MG/2ML IV SOLN
INTRAVENOUS | Status: DC | PRN
  Administered 2022-05-19: 200 mg via INTRAVENOUS

## 2022-05-19 MED ORDER — ROCURONIUM BROMIDE 10 MG/ML (PF) SYRINGE
PREFILLED_SYRINGE | INTRAVENOUS | Status: DC | PRN
  Administered 2022-05-19: 60 mg via INTRAVENOUS

## 2022-05-19 MED ORDER — PHENYLEPHRINE HCL (PRESSORS) 10 MG/ML IV SOLN
INTRAVENOUS | Status: AC
  Filled 2022-05-19: qty 1

## 2022-05-19 MED ORDER — DEXAMETHASONE SODIUM PHOSPHATE 10 MG/ML IJ SOLN
INTRAMUSCULAR | Status: AC
  Filled 2022-05-19: qty 1

## 2022-05-19 MED ORDER — MIDAZOLAM HCL 2 MG/2ML IJ SOLN
INTRAMUSCULAR | Status: AC
  Filled 2022-05-19: qty 2

## 2022-05-19 MED ORDER — PHENYLEPHRINE HCL-NACL 20-0.9 MG/250ML-% IV SOLN
INTRAVENOUS | Status: DC | PRN
  Administered 2022-05-19: 15 ug/min via INTRAVENOUS

## 2022-05-19 MED ORDER — KETAMINE HCL 50 MG/5ML IJ SOSY
PREFILLED_SYRINGE | INTRAMUSCULAR | Status: AC
  Filled 2022-05-19: qty 5

## 2022-05-19 MED ORDER — DEXAMETHASONE SODIUM PHOSPHATE 10 MG/ML IJ SOLN
INTRAMUSCULAR | Status: DC | PRN
  Administered 2022-05-19: 4 mg via INTRAVENOUS

## 2022-05-19 MED ORDER — BUPIVACAINE-EPINEPHRINE (PF) 0.25% -1:200000 IJ SOLN
INTRAMUSCULAR | Status: AC
  Filled 2022-05-19: qty 60

## 2022-05-19 MED ORDER — LACTATED RINGERS IV SOLN
INTRAVENOUS | Status: DC | PRN
  Administered 2022-05-19: 1000 mL

## 2022-05-19 MED ORDER — ROCURONIUM BROMIDE 10 MG/ML (PF) SYRINGE
PREFILLED_SYRINGE | INTRAVENOUS | Status: AC
  Filled 2022-05-19: qty 10

## 2022-05-19 MED ORDER — EPHEDRINE SULFATE-NACL 50-0.9 MG/10ML-% IV SOSY
PREFILLED_SYRINGE | INTRAVENOUS | Status: DC | PRN
  Administered 2022-05-19 (×2): 5 mg via INTRAVENOUS

## 2022-05-19 MED ORDER — FENTANYL CITRATE (PF) 100 MCG/2ML IJ SOLN
INTRAMUSCULAR | Status: DC | PRN
  Administered 2022-05-19: 25 ug via INTRAVENOUS
  Administered 2022-05-19: 50 ug via INTRAVENOUS
  Administered 2022-05-19: 25 ug via INTRAVENOUS

## 2022-05-19 MED ORDER — ONDANSETRON HCL 4 MG/2ML IJ SOLN
INTRAMUSCULAR | Status: AC
  Filled 2022-05-19: qty 2

## 2022-05-19 MED ORDER — ONDANSETRON HCL 4 MG/2ML IJ SOLN
INTRAMUSCULAR | Status: DC | PRN
  Administered 2022-05-19: 4 mg via INTRAVENOUS

## 2022-05-19 MED ORDER — PROPOFOL 10 MG/ML IV BOLUS
INTRAVENOUS | Status: DC | PRN
  Administered 2022-05-19: 100 mg via INTRAVENOUS

## 2022-05-19 SURGICAL SUPPLY — 47 items
APL SKNCLS STERI-STRIP NONHPOA (GAUZE/BANDAGES/DRESSINGS) ×1
APPLIER CLIP ROT 10 11.4 M/L (STAPLE) ×1
APR CLP MED LRG 11.4X10 (STAPLE) ×1
BAG COUNTER SPONGE SURGICOUNT (BAG) IMPLANT
BAG SPEC RTRVL 10 TROC 200 (ENDOMECHANICALS) ×1
BAG SPNG CNTER NS LX DISP (BAG) ×1
BENZOIN TINCTURE PRP APPL 2/3 (GAUZE/BANDAGES/DRESSINGS) ×2 IMPLANT
CLIP APPLIE ROT 10 11.4 M/L (STAPLE) ×2 IMPLANT
CLSR STERI-STRIP ANTIMIC 1/2X4 (GAUZE/BANDAGES/DRESSINGS) IMPLANT
COVER MAYO STAND STRL (DRAPES) ×2 IMPLANT
COVER SURGICAL LIGHT HANDLE (MISCELLANEOUS) ×2 IMPLANT
DRAPE C-ARM 42X120 X-RAY (DRAPES) ×2 IMPLANT
DRAPE UTILITY XL STRL (DRAPES) ×2 IMPLANT
DRSG TEGADERM 2-3/8X2-3/4 SM (GAUZE/BANDAGES/DRESSINGS) IMPLANT
DRSG TEGADERM 4X4.75 (GAUZE/BANDAGES/DRESSINGS) IMPLANT
ELECT REM PT RETURN 15FT ADLT (MISCELLANEOUS) ×2 IMPLANT
GAUZE SPONGE 2X2 8PLY STRL LF (GAUZE/BANDAGES/DRESSINGS) IMPLANT
GLOVE BIO SURGEON STRL SZ7 (GLOVE) ×2 IMPLANT
GLOVE BIOGEL PI IND STRL 7.0 (GLOVE) ×2 IMPLANT
GLOVE BIOGEL PI IND STRL 7.5 (GLOVE) ×2 IMPLANT
GLOVE BIOGEL PI INDICATOR 7.0 (GLOVE) ×1
GLOVE BIOGEL PI INDICATOR 7.5 (GLOVE) ×1
GOWN STRL REUS W/ TWL LRG LVL3 (GOWN DISPOSABLE) ×4 IMPLANT
GOWN STRL REUS W/TWL LRG LVL3 (GOWN DISPOSABLE) ×2
IRRIG SUCT STRYKERFLOW 2 WTIP (MISCELLANEOUS) ×1
IRRIGATION SUCT STRKRFLW 2 WTP (MISCELLANEOUS) ×2 IMPLANT
KIT BASIN OR (CUSTOM PROCEDURE TRAY) ×2 IMPLANT
KIT TURNOVER KIT A (KITS) IMPLANT
NS IRRIG 1000ML POUR BTL (IV SOLUTION) ×2 IMPLANT
PAD POSITIONING PINK XL (MISCELLANEOUS) IMPLANT
PENCIL SMOKE EVACUATOR (MISCELLANEOUS) IMPLANT
POUCH RETRIEVAL ECOSAC 10 (ENDOMECHANICALS) IMPLANT
POUCH RETRIEVAL ECOSAC 10MM (ENDOMECHANICALS) ×1
PROTECTOR NERVE ULNAR (MISCELLANEOUS) IMPLANT
SCISSORS LAP 5X35 DISP (ENDOMECHANICALS) IMPLANT
SET CHOLANGIOGRAPH MIX (MISCELLANEOUS) ×2 IMPLANT
SET TUBE SMOKE EVAC HIGH FLOW (TUBING) IMPLANT
SPIKE FLUID TRANSFER (MISCELLANEOUS) ×2 IMPLANT
STRIP CLOSURE SKIN 1/2X4 (GAUZE/BANDAGES/DRESSINGS) ×2 IMPLANT
SUT MNCRL AB 4-0 PS2 18 (SUTURE) ×2 IMPLANT
SYS BAG RETRIEVAL 10MM (BASKET)
SYSTEM BAG RETRIEVAL 10MM (BASKET) IMPLANT
TAPE CLOTH 4X10 WHT NS (GAUZE/BANDAGES/DRESSINGS) IMPLANT
TOWEL OR 17X26 10 PK STRL BLUE (TOWEL DISPOSABLE) ×6 IMPLANT
TRAY LAPAROSCOPIC (CUSTOM PROCEDURE TRAY) ×2 IMPLANT
TROCAR 11X100 Z THREAD (TROCAR) ×2 IMPLANT
TROCAR BALLN 12MMX100 BLUNT (TROCAR) ×2 IMPLANT

## 2022-05-19 NOTE — Discharge Instructions (Signed)
[facility] ______[facility] LAPAROSCOPIC SURGERY: POST OP INSTRUCTIONS Always review your discharge instruction sheet given to you by the facility where your surgery was performed. IF YOU HAVE DISABILITY OR FAMILY LEAVE FORMS, YOU MUST BRING THEM TO THE OFFICE FOR PROCESSING.   DO NOT GIVE THEM TO YOUR DOCTOR.  A prescription for pain medication may be given to you upon discharge.  Take your pain medication as prescribed, if needed.  If narcotic pain medicine is not needed, then you may take acetaminophen (Tylenol) or ibuprofen (Advil) as needed. Take your usually prescribed medications unless otherwise directed. If you need a refill on your pain medication, please contact your pharmacy.  They will contact our office to request authorization. Prescriptions will not be filled after 5pm or on week-ends. You should follow a light diet the first few days after arrival home, such as soup and crackers, etc.  Be sure to include lots of fluids daily. Most patients will experience some swelling and bruising in the area of the incisions.  Ice packs will help.  Swelling and bruising can take several days to resolve.  It is common to experience some constipation if taking pain medication after surgery.  Increasing fluid intake and taking a stool softener (such as Colace) will usually help or prevent this problem from occurring.  A mild laxative (Milk of Magnesia or Miralax) should be taken according to package instructions if there are no bowel movements after 48 hours. Unless discharge instructions indicate otherwise, you may remove your bandages 24-48 hours after surgery, and you may shower at that time.  You may have steri-strips (small skin tapes) in place directly over the incision.  These strips should be left on the skin for 7-10 days.  If your surgeon used skin glue on the incision, you may shower in 24 hours.  The glue will flake off over the next 2-3 weeks.  Any sutures or staples will be  removed at the office during your follow-up visit. ACTIVITIES:  You may resume regular (light) daily activities beginning the next day--such as daily self-care, walking, climbing stairs--gradually increasing activities as tolerated.  You may have sexual intercourse when it is comfortable.  Refrain from any heavy lifting or straining until approved by your doctor. You may drive when you are no longer taking prescription pain medication, you can comfortably wear a seatbelt, and you can safely maneuver your car and apply brakes. RETURN TO WORK:  __________________________________________________________ Dennis Bast should see your doctor in the office for a follow-up appointment approximately 2-3 weeks after your surgery.  Make sure that you call for this appointment within a day or two after you arrive home to insure a convenient appointment time. OTHER INSTRUCTIONS: __________________________________________________________________________________________________________________________ __________________________________________________________________________________________________________________________ WHEN TO CALL YOUR DOCTOR: Fever over 101.0 Inability to urinate Continued bleeding from incision. Increased pain, redness, or drainage from the incision. Increasing abdominal pain  The clinic staff is available to answer your questions during regular business hours.  Please don't hesitate to call and ask to speak to one of the nurses for clinical concerns.  If you have a medical emergency, go to the nearest emergency room or call 911.  A surgeon from [facility] is always on call at the hospital. [address] [place], [place], [place]  [address] ? [address], [place], [place]   [address] [phone] ? [phone] ? FAX [phone] Web site: [email or web address]

## 2022-05-19 NOTE — Transfer of Care (Signed)
Immediate Anesthesia Transfer of Care Note  [name]  Procedure(s) Performed: LAPAROSCOPIC CHOLECYSTECTOMY WITH POSSIBLE INTRAOPERATIVE CHOLANGIOGRAM  Patient Location: PACU  Anesthesia Type:General  Level of Consciousness: drowsy  Airway & Oxygen Therapy: Patient Spontanous Breathing and Patient connected to face mask oxygen  Post-op Assessment: Report given to RN and Post -op Vital signs reviewed and stable  Post vital signs: Reviewed and stable  Last Vitals:  Vitals Value Taken Time  BP 121/56 05/19/22 1100  Temp 36.4 C 05/19/22 1100  Pulse 57 05/19/22 1105  Resp 20 05/19/22 1105  SpO2 95 % 05/19/22 1105  Vitals shown include unvalidated device data.  Last Pain:  Vitals:   05/19/22 1100  TempSrc:   PainSc: 0-No pain         Complications: No notable events documented.

## 2022-05-19 NOTE — Op Note (Signed)
Laparoscopic Cholecystectomy with IOC Procedure Note  Indications: This patient presents with symptomatic cholelithiasis, anticoagulated on Plavix.  She has held her plavix and will undergo laparoscopic cholecystectomy with cholangiogram.  Pre-operative Diagnosis: Calculus of gallbladder with other cholecystitis, without mention of obstruction  Post-operative Diagnosis: Same  Surgeon: Maia Petties   Assistants: none  Anesthesia: General endotracheal anesthesia  ASA Class: 2  Procedure Details  The patient was seen again in the Holding Room. The risks, benefits, complications, treatment options, and expected outcomes were discussed with the patient. The possibilities of reaction to medication, pulmonary aspiration, perforation of viscus, bleeding, recurrent infection, finding a normal gallbladder, the need for additional procedures, failure to diagnose a condition, the possible need to convert to an open procedure, and creating a complication requiring transfusion or operation were discussed with the patient. The likelihood of improving the patient's symptoms with return to their baseline status is good.  The patient and/or family concurred with the proposed plan, giving informed consent. The site of surgery properly noted. The patient was taken to Operating Room, identified as Victoria Eaton and the procedure verified as Laparoscopic Cholecystectomy with Intraoperative Cholangiogram. A Time Out was held and the above information confirmed.  Prior to the induction of general anesthesia, antibiotic prophylaxis was administered. General endotracheal anesthesia was then administered and tolerated well. After the induction, the abdomen was prepped with Chloraprep and draped in the sterile fashion. The patient was positioned in the supine position.  Local anesthetic agent was injected into the skin above the umbilicus and an incision made. We dissected down to the abdominal fascia with blunt  dissection.  The fascia was incised vertically and we entered the peritoneal cavity bluntly.  A pursestring suture of 0-Vicryl was placed around the fascial opening.  The Hasson cannula was inserted and secured with the stay suture.  Pneumoperitoneum was then created with CO2 and tolerated well without any adverse changes in the patient's vital signs. An 11-mm port was placed in the subxiphoid position.  Two 5-mm ports were placed in the right upper quadrant. All skin incisions were infiltrated with a local anesthetic agent before making the incision and placing the trocars.   We positioned the patient in reverse Trendelenburg, tilted slightly to the patient's left.  The gallbladder was identified, the fundus grasped and retracted cephalad. She had significant omental adhesions to the gallbladder, but no sign of acute cholecystitis.  Adhesions were lysed bluntly and with the electrocautery where indicated, taking care not to injure any adjacent organs or viscus. The infundibulum was grasped and retracted laterally, exposing the peritoneum overlying the triangle of Calot. This was then divided and exposed in a blunt fashion. A critical view of the cystic duct and cystic artery was obtained.  The cystic duct was clearly identified and bluntly dissected circumferentially. The cystic duct was ligated with a clip distally.   An incision was made in the cystic duct and the [employer] cholangiogram catheter introduced. The catheter was secured using a clip. A cholangiogram was then obtained which showed good visualization of the distal and proximal biliary tree with no sign of filling defects or obstruction.  Contrast flowed easily into the duodenum. The catheter was then removed.   The cystic duct was then ligated with clips and divided. The cystic artery was identified, dissected free, ligated with clips and divided as well.   The gallbladder was dissected from the liver bed in retrograde fashion with the electrocautery.  The gallbladder was removed and placed in an  Eco sac. The liver bed was irrigated and inspected. Hemostasis was achieved with the electrocautery. Copious irrigation was utilized and was repeatedly aspirated until clear.  The gallbladder and Eco sac were then removed through the umbilical port site.  The pursestring suture was used to close the umbilical fascia.    We again inspected the right upper quadrant for hemostasis.  Pneumoperitoneum was released as we removed the trocars.  4-0 Monocryl was used to close the skin.   Benzoin, steri-strips, and clean dressings were applied. The patient was then extubated and brought to the recovery room in stable condition. Instrument, sponge, and needle counts were correct at closure and at the conclusion of the case.   Findings: Cholecystitis with Cholelithiasis  Estimated Blood Loss: Minimal         Drains: none         Specimens: Gallbladder           Complications: None; patient tolerated the procedure well.         Disposition: PACU - hemodynamically stable.         Condition: stable  Victoria Eaton. Victoria Dover, MD, [facility]  General Surgery   05/19/2022 10:57 AM

## 2022-05-19 NOTE — Anesthesia Procedure Notes (Signed)
Procedure Name: Intubation Date/Time: 05/19/2022 9:54 AM  Performed by: Milford Cage, CRNAPre-anesthesia Checklist: Patient identified, Emergency Drugs available, Suction available and Patient being monitored Patient Re-evaluated:Patient Re-evaluated prior to induction Oxygen Delivery Method: Circle system utilized Preoxygenation: Pre-oxygenation with 100% oxygen Induction Type: IV induction Ventilation: Mask ventilation without difficulty Laryngoscope Size: Miller and 2 Grade View: Grade II Tube type: Oral Tube size: 7.0 mm Number of attempts: 1 Airway Equipment and Method: Stylet Placement Confirmation: ETT inserted through vocal cords under direct vision, positive ETCO2 and breath sounds checked- equal and bilateral Secured at: 21 cm Tube secured with: Tape Dental Injury: Teeth and Oropharynx as per pre-operative assessment

## 2022-05-19 NOTE — Anesthesia Postprocedure Evaluation (Signed)
Anesthesia Post Note  [name]  Procedure(s) Performed: LAPAROSCOPIC CHOLECYSTECTOMY WITH POSSIBLE INTRAOPERATIVE CHOLANGIOGRAM     Patient location during evaluation: PACU Anesthesia Type: General Level of consciousness: awake and alert, oriented and patient cooperative Pain management: pain level controlled Vital Signs Assessment: post-procedure vital signs reviewed and stable Respiratory status: spontaneous breathing, nonlabored ventilation and respiratory function stable Cardiovascular status: blood pressure returned to baseline and stable Postop Assessment: no apparent nausea or vomiting Anesthetic complications: no   No notable events documented.  Last Vitals:  Vitals:   05/19/22 1145 05/19/22 1156  BP: (!) 106/42 (!) 124/51  Pulse: (!) 55 (!) 58  Resp: 10 10  Temp: (!) 36.4 C   SpO2: 95% 94%    Last Pain:  Vitals:   05/19/22 1156  TempSrc:   PainSc: 0-No pain                 Pervis Hocking

## 2022-05-19 NOTE — Discharge Summary (Signed)
[facility] Discharge Summary   Patient ID: Victoria Eaton MRN: [id number] DOB/AGE: 1961-01-04 61 y.o.  Admit date: 05/14/2022 Discharge date: 05/16/2022  Admitting Diagnosis: Biliary colic [K93.81] Symptomatic cholelithiasis [K80.20]  Discharge Diagnosis Biliary colic [W29.93] Symptomatic cholelithiasis [K80.20]  Consultants Hospitalist Service  Imaging: DG Cholangiogram Operative  Result Date: 05/19/2022 CLINICAL DATA:  Laparoscopic cholecystectomy with intraoperative cholangiogram EXAM: INTRAOPERATIVE CHOLANGIOGRAM TECHNIQUE: Cholangiographic images from the C-arm fluoroscopic device were submitted for interpretation post-operatively. Please see the procedural report for the amount of contrast and the fluoroscopy time utilized. FLUOROSCOPY: Radiation Exposure Index (as provided by the fluoroscopic device): 3.73 mGy Kerma COMPARISON:  Abdominal ultrasound 05/14/2022 FINDINGS: A cine clip is submitted for review. The images demonstrate cannulation of the cystic duct with intraoperative cholangiogram. Multiple stones present in the gallbladder lumen and cystic duct. The common bile duct is patent without evidence of stenosis, stricture or choledocholithiasis. Contrast material passes through the ampulla and into the duodenum. Of note, there is retrograde filling of a low insertion of 1 of the intrahepatic ducts. This is an anatomic variant. IMPRESSION: 1. Cholelithiasis. 2. No evidence of choledocholithiasis. 3. Variant anatomy with low insertion of 1 of the right-sided hepatic ducts. Electronically Signed   By: Jacqulynn Cadet M.D.   On: 05/19/2022 11:05    Procedures None  Hospital Course:  61 year old female who presented to the [facility] ED with abdominal pain.  Workup showed biliary colic/symptomatic cholelithiasis.  Patient was admitted to the general surgery service and Hospitalist team consulted in regard to her cardiac comorbidities. She takes plavix daily for  history of CAD status post CABG. She took a dose on 8/23. Plavix needs to be held 5 days before surgery which [facility] cleared her to safely do. Since she felt well, was tolerating a low fat diet without symptoms, and was clinically stable discussed with patient the option of discharging with plans to return on Monday 8/28 for laparoscopic cholecystectomy once plavix had been held for 5 days. She wished to discharge home and return Monday. Surgery was scheduled prior to her discharge and return precautions discussed. She was discharged with tramadol to manage pain in the interim before surgery.    I or a member of my team have reviewed this patient in the Controlled Substance Database.  Allergies as of 05/16/2022       Reactions   Januvia [sitagliptin] Nausea And Vomiting, Other (See Comments)   Pancreatitis (this was in Epic, but patient was unaware of this)   Metformin And Related Diarrhea   Penicillins Other (See Comments)   Unknown childhood reaction Has patient had a PCN reaction causing immediate rash, facial/tongue/throat swelling, SOB or lightheadedness with hypotension: Unknown Has patient had a PCN reaction causing severe rash involving mucus membranes or skin necrosis: Unknown Has patient had a PCN reaction that required hospitalization: Unknown Has patient had a PCN reaction occurring within the last 10 years: No If all of the above answers are "NO", then may proceed with Cephalosporin use.        Medication List     STOP taking these medications    oxyCODONE 5 MG immediate release tablet Commonly known as: Oxy IR/ROXICODONE       TAKE these medications    acetaminophen 500 MG tablet Commonly known as: TYLENOL Take 1 tablet (500 mg total) by mouth every 6 (six) hours as needed (pain).   amLODipine 5 MG tablet Commonly known as: NORVASC TAKE ONE TABLET BY MOUTH ONE TIME  DAILY   Basaglar KwikPen 100 UNIT/ML Inject 53 Units into the skin daily.   buPROPion 300 MG 24 hr  tablet Commonly known as: WELLBUTRIN XL TAKE ONE TABLET BY MOUTH ONE TIME DAILY   clopidogrel 75 MG tablet Commonly known as: PLAVIX Take 1 tablet (75 mg total) by mouth daily.   dapagliflozin propanediol 5 MG Tabs tablet Commonly known as: Farxiga TAKE ONE TABLET BY MOUTH ONE TIME DAILY BEFORE BREAKFAST What changed:  how much to take how to take this when to take this additional instructions   ezetimibe 10 MG tablet Commonly known as: ZETIA TAKE ONE TABLET BY MOUTH ONE TIME DAILY   FeroSul 325 (65 FE) MG tablet Generic drug: ferrous sulfate TAKE ONE TABLET BY MOUTH ONE TIME DAILY What changed:  how much to take when to take this   Fish Oil 1200 MG Caps Take 1,200 mg by mouth daily.   FreeStyle Libre 14 Day Sensor Misc APPLY ONE SENSOR TO THE BACK OF YOUR UPPER ARM. REPLACE EVERY 14 DAYS.   isosorbide mononitrate 120 MG 24 hr tablet Commonly known as: IMDUR TAKE ONE TABLET BY MOUTH ONE TIME DAILY   lisinopril 5 MG tablet Commonly known as: ZESTRIL TAKE ONE TABLET BY MOUTH ONE TIME DAILY   methocarbamol 500 MG tablet Commonly known as: Robaxin Take 1 tablet (500 mg total) by mouth 2 (two) times daily as needed.   metoprolol tartrate 50 MG tablet Commonly known as: LOPRESSOR TAKE ONE TABLET BY MOUTH TWICE A DAY   multivitamin with minerals Tabs tablet Take 1 tablet by mouth daily.   nitroGLYCERIN 0.4 MG SL tablet Commonly known as: NITROSTAT Place 0.4 mg under the tongue every 5 (five) minutes as needed for chest pain.   NovoLOG FlexPen 100 UNIT/ML FlexPen Generic drug: insulin aspart Inject 5 Units into the skin 3 (three) times daily with meals as needed for high blood sugar.   polyethylene glycol powder 17 GM/SCOOP powder Commonly known as: GLYCOLAX/MIRALAX Take 17 g by mouth daily. Drink 17g (1 scoop) dissolved in water per day.   predniSONE 10 MG (21) Tbpk tablet Commonly known as: STERAPRED UNI-PAK 21 TAB Take as directed   rosuvastatin 40 MG  tablet Commonly known as: CRESTOR TAKE ONE TABLET BY MOUTH ONE TIME DAILY   sertraline 100 MG tablet Commonly known as: ZOLOFT TAKE TWO TABLETS BY MOUTH ONE TIME DAILY What changed: See the new instructions.   sulfaSALAzine 500 MG EC tablet Commonly known as: AZULFIDINE Take 2 tablets (1,000 mg total) by mouth 2 (two) times daily.   traMADol 50 MG tablet Commonly known as: ULTRAM Take 1 tablet (50 mg total) by mouth every 6 (six) hours as needed for up to 3 days for moderate pain or severe pain.   Trulicity 1.5 VU/0.2BX Sopn Generic drug: Dulaglutide INJECT THE CONTENTS OF ONE PEN UNDER THE SKIN WEEKLY ON THE SAME DAY EACH WEEK What changed: See the new instructions.   Turmeric 500 MG Caps Take 1,000 mg by mouth daily.   UltiCare Micro Pen Needles 32G X 4 MM Misc Generic drug: Insulin Pen Needle USE WITH INJECTIONS FOUR TIMES A DAY           Signed: Winferd Humphrey , [facility] 05/19/2022, 2:57 PM Please see Amion for pager number during day hours 7:00am-4:30pm

## 2022-05-19 NOTE — Interval H&P Note (Signed)
History and Physical Interval Note:  05/19/2022 8:31 AM  Victoria Eaton  has presented today for surgery, with the diagnosis of BILIARY COLIC.  The various methods of treatment have been discussed with the patient and family. After consideration of risks, benefits and other options for treatment, the patient has consented to  Procedure(s): LAPAROSCOPIC CHOLECYSTECTOMY WITH POSSIBLE INTRAOPERATIVE CHOLANGIOGRAM (N/A) as a surgical intervention.  The patient's history has been reviewed, patient examined, no change in status, stable for surgery.  I have reviewed the patient's chart and labs.  Questions were answered to the patient's satisfaction.     Maia Petties

## 2022-05-19 NOTE — Progress Notes (Signed)
Contacted Dr. Georgette Dover for orders.

## 2022-05-20 ENCOUNTER — Encounter (HOSPITAL_COMMUNITY): Payer: Self-pay | Admitting: Surgery

## 2022-05-20 LAB — SURGICAL PATHOLOGY

## 2022-05-21 ENCOUNTER — Telehealth: Payer: Self-pay | Admitting: Neurology

## 2022-05-21 NOTE — Telephone Encounter (Signed)
[employer] pending faxed notes

## 2022-05-29 NOTE — Telephone Encounter (Signed)
[employer] denied the Split  LVM for pt to call back to schedule  HST- [employer] no auth req via automated machine ref # [id number]

## 2022-05-29 NOTE — Telephone Encounter (Signed)
Patient returned my call.  MAIL OUT HST- [employer] no auth req via fax ref # [id number].  She is aware to not through anything away until after I reach out to her to let her know we have received the recording.

## 2022-06-02 ENCOUNTER — Ambulatory Visit: Payer: Commercial Managed Care - HMO | Admitting: Neurology

## 2022-06-02 DIAGNOSIS — G4733 Obstructive sleep apnea (adult) (pediatric): Secondary | ICD-10-CM

## 2022-06-02 DIAGNOSIS — R5382 Chronic fatigue, unspecified: Secondary | ICD-10-CM

## 2022-06-02 DIAGNOSIS — R0683 Snoring: Secondary | ICD-10-CM

## 2022-06-02 DIAGNOSIS — Z794 Long term (current) use of insulin: Secondary | ICD-10-CM

## 2022-06-02 DIAGNOSIS — I25708 Atherosclerosis of coronary artery bypass graft(s), unspecified, with other forms of angina pectoris: Secondary | ICD-10-CM

## 2022-06-02 DIAGNOSIS — G4719 Other hypersomnia: Secondary | ICD-10-CM

## 2022-06-02 DIAGNOSIS — Z720 Tobacco use: Secondary | ICD-10-CM

## 2022-06-02 DIAGNOSIS — I252 Old myocardial infarction: Secondary | ICD-10-CM

## 2022-06-02 DIAGNOSIS — G4730 Sleep apnea, unspecified: Secondary | ICD-10-CM

## 2022-06-11 ENCOUNTER — Telehealth: Payer: Self-pay | Admitting: *Deleted

## 2022-06-11 NOTE — Telephone Encounter (Signed)
Left detailed messaged (VM states ok to leave detailed message) about results per Dr. Edwena Felty note. Advised that once CPAP titration approved via insurance, the sleep lab will be in touch to schedule this with her. Asked her to call back if she has any questions.

## 2022-06-11 NOTE — Telephone Encounter (Signed)
-----   Message from Larey Seat, MD sent at 06/11/2022  1:14 PM EDT ----- REM pAHI: 46.5/h NREM pAHI:65.1/h Supine AHI was 102/h  IMPRESSION:  This HST confirms the presence of severe sleep apnea mostly in supine position and most severe in non-REM sleep.  This constellation could indicate a central apnea underlying.  Any sleep apnea of this severity associated with hypoxia should be treated in an in lab titration. Bradycardia was present. Oxygen nadir at 70% .  RECOMMENDATION:  Plan A) In- lab- titration to PAP, allowing( if needed) changing to BiPAP or adding oxygen.    Plan B) If her insurance carrier does not permit in lab titration I will order a device for auto- titration CPAP at factory settings between 5 and 20 cm water pressure with heated humidification and interface of her choice, EPR of 3 cm water.  Within 30 to 90 days on CPAP therapy the patient should undergo an overnight pulse oximetry and a follow-up visit in the sleep clinic.

## 2022-06-11 NOTE — Addendum Note (Signed)
Addended by: Larey Seat on: 06/11/2022 01:15 PM   Modules accepted: Orders

## 2022-06-11 NOTE — Procedures (Signed)
[facility] TEST REPORT ( by Watch PAT)   STUDY DATE:  06-04-2022 XTG:[id number]    ORDERING CLINICIAN: Larey Seat, MD  REFERRING CLINICIAN: Dr Martinique, MD    CLINICAL INFORMATION/HISTORY: 04-28-2022:BMI over 30, only sleeping 3-4hrs a night, CAD, and hypertension. No prior SS. The patient reports waking up from hand pain, having numbness, feeling of constant fatigue, some morning HA." I don't sleep well ever since my teenage".  Victoria Eaton has a past medical history of Anxiety, Arthritis, Bladder prolapse, female, acquired (09/14/2017), Coronary artery disease, Depression, High cholesterol, History of stomach ulcers, Hypertension, Myocardial infarction ([place]) (12/30/2004), long history of Tobacco abuse, and Type II diabetes mellitus ([place]).     Epworth sleepiness score: 11/24.   BMI: 30.8 kg/m   Neck Circumference: 15"   FINDINGS:   Sleep Summary:   Total Recording Time (hours, min): 6 hours and 8 minutes.       Total Sleep Time (hours, min): 5 hours and 27 minutes               Percent REM (%):     16.7%                                   Respiratory Indices:   Calculated pAHI (per hour):   61.9/h                          REM pAHI:   46.5/h                                            NREM pAHI:   65.1/h                          Positional AHI: The patient slept 175 minutes on her right side with an AHI of 31.9/h 35 minutes on her left with an AHI of 79.2/h and 116 minutes on her back with an AHI of 102/h. Snoring reached a mean volume of 43 dB and was present for 32% of total recorded time.  The highest volume was over 60 dB and present for about 7 minutes of sleep time.  Sleep latency was short at only 5 minutes REM latency was prolonged at 65 minutes.                                                 Oxygen Saturation Statistics:   Oxygen Saturation (%) Mean: 91%             Nadir at 70% and maximum saturation at 98% O2 Saturation Range (%)                                        O2 Saturation (minutes) <89%: 19.4 minutes, equivalent to 6% of total sleep time.         Pulse Rate Statistics:   Pulse Mean (bpm):    60 bpm             Pulse Range:  Between 38 and 82 bpm.               IMPRESSION:  This HST confirms the presence of severe sleep apnea mostly in supine position and most severe in non-REM sleep.  This constellation could indicate a central apnea underlying.  Any sleep apnea of this severity associated with hypoxia should be treated in an in lab titration.   RECOMMENDATION:  Plan A) In- lab- titration to PAP, allowing( if needed) changing to BiPAP or adding oxygen.    Plan B) If her insurance carrier does not permit in lab titration I will order a device for auto- titration CPAP at factory settings between 5 and 20 cm water pressure with heated humidification and interface of her choice, EPR of 3 cm water.  Within 30 to 90 days on CPAP therapy the patient should undergo an overnight pulse oximetry and a follow-up visit in the sleep clinic.    INTERPRETING PHYSICIAN:   Larey Seat, MD   Medical Director of [facility].

## 2022-06-12 ENCOUNTER — Telehealth: Payer: Self-pay | Admitting: Neurology

## 2022-06-12 NOTE — Telephone Encounter (Signed)
Victoria Eaton it is pending and I had to do an appeal on the study due to it is the same CPT code as the Split study [phone] that was order at her office visit and it got denied.

## 2022-06-13 ENCOUNTER — Other Ambulatory Visit: Payer: Self-pay | Admitting: Internal Medicine

## 2022-06-13 ENCOUNTER — Other Ambulatory Visit: Payer: Self-pay | Admitting: Family Medicine

## 2022-06-13 DIAGNOSIS — I214 Non-ST elevation (NSTEMI) myocardial infarction: Secondary | ICD-10-CM

## 2022-06-17 NOTE — Progress Notes (Unsigned)
HPI: Victoria Eaton is a 61 y.o. female with hx of DM II,HTN,CAD s/p CABG,HLD, depression,and anxiety here today for follow up.  Last seen on 11/26/21 Since her last visit she has had sleep study, following with neurologist, CPAP recommended. She has also seen general surgeon to follow-up on laparoscopic cholecystectomy performed on 05/19/2022.[name]  Hypertension:  Medications: Amlodipine 5 mg daily, Lisinopril 5 mg daily, Metoprolol 50 mg bid, and Isosorbide Mononitrate 120 mg daily. BP readings at home:Not frequently. Side effects:None Negative for unusual or severe headache, visual changes, exertional chest pain, dyspnea,  focal weakness, or edema.  Lab Results  Component Value Date   CREATININE 0.91 05/15/2022   BUN 16 05/15/2022   NA 141 05/15/2022   K 3.9 05/15/2022   CL 109 05/15/2022   CO2 27 05/15/2022   Diabetes Mellitus II: Dx'ed in 2014. - Checking BG at home: Most < 200, has had some BS's 60's and a 48 x1 for the past week. - Medications: Farxiga 5 mg daily, Basaglar 53 units daily, and Trulicity 1.5 mg weekly (has not taken it in 2-3 weeks).She has not taken Novolog 5 units tid in a few weeks. - Diet: For the past 2 months she has not had many sweets. - Exercise: More active. She is planning on starting working with a trainer. - eye exam: 02/2022. - foot exam: 09/06/21 - Negative for polyuria, polydipsia, numbness extremities, foot ulcers/trauma  Lab Results  Component Value Date   HGBA1C 7.5 (H) 12/16/2021   Lab Results  Component Value Date   MICROALBUR 3.7 (H) 09/06/2021   Depression: She is on Wellbutrin XL 300 mg daily. Symptoms stable. She is seeing a Social worker weekly. She has been under some stress, cleaning her mother's house,who has passed away. Still smoking, 10 cig/day.     06/18/2022   11:10 AM 12/16/2021    8:49 AM 03/08/2021   11:44 AM 11/07/2020    2:25 PM 04/16/2020    1:58 PM  Depression screen PHQ 2/9  Decreased Interest 2 1 2  0 1  Down,  Depressed, Hopeless 2 3 3  0 1  PHQ - 2 Score 4 4 5  0 2  Altered sleeping 3 3 0    Tired, decreased energy 3 3 1     Change in appetite 0 0 0    Feeling bad or failure about yourself  2 1 1     Trouble concentrating 0 2 1    Moving slowly or fidgety/restless 0 2 0    Suicidal thoughts 0 0 0    PHQ-9 Score 12 15 8     Difficult doing work/chores Somewhat difficult Somewhat difficult      Review of Systems  Constitutional:  Positive for fatigue. Negative for activity change, appetite change and fever.  HENT:  Negative for mouth sores, nosebleeds and sore throat.   Respiratory:  Negative for cough and wheezing.   Gastrointestinal:  Negative for abdominal pain, nausea and vomiting.       Negative for changes in bowel habits.  Genitourinary:  Negative for decreased urine volume, dysuria and hematuria.  Skin:  Negative for rash.  Neurological:  Negative for syncope, facial asymmetry and weakness.  Rest of ROS see pertinent positives and negatives in HPI.  Current Outpatient Medications on File Prior to Visit  Medication Sig Dispense Refill   acetaminophen (TYLENOL) 500 MG tablet Take 1 tablet (500 mg total) by mouth every 6 (six) hours as needed (pain). 30 tablet 0   amLODipine (NORVASC) 5 MG  tablet TAKE ONE TABLET BY MOUTH ONE TIME DAILY 90 tablet 2   buPROPion (WELLBUTRIN XL) 300 MG 24 hr tablet TAKE ONE TABLET BY MOUTH ONE TIME DAILY (Patient taking differently: Take 300 mg by mouth daily.) 90 tablet 2   clopidogrel (PLAVIX) 75 MG tablet TAKE ONE TABLET BY MOUTH ONE TIME DAILY 90 tablet 2   Continuous Blood Gluc Sensor (FREESTYLE LIBRE 14 DAY SENSOR) MISC APPLY ONE SENSOR TO THE BACK OF YOUR UPPER ARM. REPLACE EVERY 14 DAYS. 3 each 5   dapagliflozin propanediol (FARXIGA) 5 MG TABS tablet TAKE ONE TABLET BY MOUTH ONE TIME DAILY BEFORE BREAKFAST (Patient taking differently: Take 5 mg by mouth daily.) 90 tablet 3   ezetimibe (ZETIA) 10 MG tablet TAKE ONE TABLET BY MOUTH ONE TIME DAILY (Patient  taking differently: Take 10 mg by mouth daily.) 30 tablet 8   insulin aspart (NOVOLOG FLEXPEN) 100 UNIT/ML FlexPen Inject 5 Units into the skin 3 (three) times daily with meals as needed for high blood sugar.     Insulin Pen Needle (ULTICARE MICRO PEN NEEDLES) 32G X 4 MM MISC USE WITH INJECTIONS FOUR TIMES A DAY 400 each 3   isosorbide mononitrate (IMDUR) 120 MG 24 hr tablet TAKE ONE TABLET BY MOUTH ONE TIME DAILY (Patient taking differently: Take 120 mg by mouth daily.) 90 tablet 2   lisinopril (ZESTRIL) 5 MG tablet TAKE ONE TABLET BY MOUTH ONE TIME DAILY (Patient taking differently: Take 5 mg by mouth daily.) 90 tablet 1   metoprolol tartrate (LOPRESSOR) 50 MG tablet TAKE ONE TABLET BY MOUTH TWICE A DAY (Patient taking differently: Take 50 mg by mouth 2 (two) times daily.) 180 tablet 2   Multiple Vitamin (MULTIVITAMIN WITH MINERALS) TABS tablet Take 1 tablet by mouth daily.     nitroGLYCERIN (NITROSTAT) 0.4 MG SL tablet Place 0.4 mg under the tongue every 5 (five) minutes as needed for chest pain.     Omega-3 Fatty Acids (FISH OIL) 1200 MG CAPS Take 1,200 mg by mouth daily.     predniSONE (STERAPRED UNI-PAK 21 TAB) 10 MG (21) TBPK tablet Take as directed 21 tablet 3   rosuvastatin (CRESTOR) 40 MG tablet TAKE ONE TABLET BY MOUTH ONE TIME DAILY (Patient taking differently: Take 40 mg by mouth daily.) 90 tablet 3   sulfaSALAzine (AZULFIDINE) 500 MG EC tablet Take 2 tablets (1,000 mg total) by mouth 2 (two) times daily. 388 tablet 11   TRULICITY 1.5 EK/8.0KL SOPN INJECT THE CONTENTS OF ONE PEN UNDER THE SKIN WEEKLY ON THE SAME DAY EACH WEEK (Patient taking differently: Inject 1.5 mg into the skin once a week.) 2 mL 6   Turmeric 500 MG CAPS Take 1,000 mg by mouth daily.     No current facility-administered medications on file prior to visit.   Past Medical History:  Diagnosis Date   Anxiety    Arthritis    "maybe in my left foot" (10/14/2017)   Bladder prolapse, female, acquired 09/14/2017    Coronary artery disease    Depression    High cholesterol    History of stomach ulcers    Hypertension    Myocardial infarction ([place]) 12/30/2004   Tobacco abuse    Type II diabetes mellitus ([facility])    Allergies  Allergen Reactions   Januvia [Sitagliptin] Nausea And Vomiting and Other (See Comments)    Pancreatitis (this was in Epic, but patient was unaware of this)    Metformin And Related Diarrhea   Penicillins Other (See Comments)  Unknown childhood reaction Has patient had a PCN reaction causing immediate rash, facial/tongue/throat swelling, SOB or lightheadedness with hypotension: Unknown Has patient had a PCN reaction causing severe rash involving mucus membranes or skin necrosis: Unknown Has patient had a PCN reaction that required hospitalization: Unknown Has patient had a PCN reaction occurring within the last 10 years: No If all of the above answers are "NO", then may proceed with Cephalosporin use.    Social History   Socioeconomic History   Marital status: Widowed    Spouse name: Glendell Docker   Number of children: 1   Years of education: Not on file   Highest education level: High school graduate  Occupational History   Not on file  Tobacco Use   Smoking status: Some Days    Packs/day: 0.75    Years: 45.00    Total pack years: 33.75    Types: Cigarettes    Start date: 05/01/1972    Last attempt to quit: 09/30/2017    Years since quitting: 4.7   Smokeless tobacco: Never  Vaping Use   Vaping Use: Never used  Substance and Sexual Activity   Alcohol use: Yes    Alcohol/week: 7.0 standard drinks of alcohol    Types: 7 Standard drinks or equivalent per week    Comment: daily: 2-3 wine   Drug use: No   Sexual activity: Not Currently  Other Topics Concern   Not on file  Social History Narrative   Lives with sister   R handed   Caffeine: couple coups of coffee and a bottle of diet coke a day   Social Determinants of Radio broadcast assistant Strain: Not on file   Food Insecurity: Not on file  Transportation Needs: Not on file  Physical Activity: Not on file  Stress: Not on file  Social Connections: Not on file   Vitals:   06/18/22 1100  BP: 110/60  Pulse: 60  Resp: 12  Temp: 98.6 F (37 C)  SpO2: 97%   Body mass index is 30.59 kg/m.  Physical Exam Vitals and nursing note reviewed.  Constitutional:      General: She is not in acute distress.    Appearance: She is well-developed.  HENT:     Head: Normocephalic and atraumatic.     Mouth/Throat:     Mouth: Mucous membranes are moist.     Pharynx: Oropharynx is clear.  Eyes:     Conjunctiva/sclera: Conjunctivae normal.  Cardiovascular:     Rate and Rhythm: Normal rate and regular rhythm.     Pulses:          Dorsalis pedis pulses are 2+ on the right side and 2+ on the left side.     Heart sounds: No murmur heard. Pulmonary:     Effort: Pulmonary effort is normal. No respiratory distress.     Breath sounds: Normal breath sounds.  Abdominal:     Palpations: Abdomen is soft. There is no hepatomegaly or mass.     Tenderness: There is no abdominal tenderness.  Lymphadenopathy:     Cervical: No cervical adenopathy.  Skin:    General: Skin is warm.     Findings: No erythema or rash.  Neurological:     General: No focal deficit present.     Mental Status: She is alert and oriented to person, place, and time.     Cranial Nerves: No cranial nerve deficit.     Gait: Gait normal.  Psychiatric:     Comments:  Well groomed, good eye contact.   ASSESSMENT AND PLAN:  Ms.Anjelina was seen today for follow-up.  Diagnoses and all orders for this visit: Orders Placed This Encounter  Procedures   Flu Vaccine QUAD 64moIM (Fluarix, Fluzone & Alfiuria Quad PF)   Microalbumin / creatinine urine ratio   POC HgB A1c   Lab Results  Component Value Date   HGBA1C 5.7 06/18/2022   Type 2 diabetes mellitus with complication, with long-term current use of insulin ([place] HgA1C has improved,it is  at goal. Having some episodes of hypoglycemia. Instructed to decrease Basaglar from 53 to 40 U daily. She can continue Novolog 5 U TID before meals prn and resume Trulicity 1.5 mg weekly. Regular exercise and healthy diet with avoidance of added sugar food intake is an important part of treatment and encouraged. Annual eye exam, periodic dental and foot care to continue. F/U in 4 months.  Major depression, recurrent ([place] Symptoms are stable. Continue Wellbutrin XL 300 mg and CBT. Recommend establishing with psychiatrist.  Essential hypertension BP adequately controlled. Continue  Amlodipine 5 mg daily, Lisinopril 5 mg daily, Metoprolol 50 mg bid, and Isosorbide Mononitrate 120 mg daily. DASH/low salt diet to continue. Monitor BP at home. Eye exam is current.  Need for influenza vaccination -     Flu Vaccine QUAD 61moM (Fluarix, Fluzone & Alfiuria Quad PF)  Return in about 4 months (around 10/18/2022).  Millissa Deese G. JoMartiniqueMD  [facility] CenterBrPlattsburgffice.

## 2022-06-18 ENCOUNTER — Ambulatory Visit (INDEPENDENT_AMBULATORY_CARE_PROVIDER_SITE_OTHER): Payer: Commercial Managed Care - HMO | Admitting: Family Medicine

## 2022-06-18 ENCOUNTER — Encounter: Payer: Self-pay | Admitting: Family Medicine

## 2022-06-18 VITALS — BP 110/60 | HR 60 | Temp 98.6°F | Resp 12 | Ht 66.0 in | Wt 189.5 lb

## 2022-06-18 DIAGNOSIS — E118 Type 2 diabetes mellitus with unspecified complications: Secondary | ICD-10-CM | POA: Diagnosis not present

## 2022-06-18 DIAGNOSIS — Z23 Encounter for immunization: Secondary | ICD-10-CM

## 2022-06-18 DIAGNOSIS — E785 Hyperlipidemia, unspecified: Secondary | ICD-10-CM

## 2022-06-18 DIAGNOSIS — I10 Essential (primary) hypertension: Secondary | ICD-10-CM | POA: Diagnosis not present

## 2022-06-18 DIAGNOSIS — Z794 Long term (current) use of insulin: Secondary | ICD-10-CM

## 2022-06-18 DIAGNOSIS — F3341 Major depressive disorder, recurrent, in partial remission: Secondary | ICD-10-CM

## 2022-06-18 LAB — POCT GLYCOSYLATED HEMOGLOBIN (HGB A1C): HbA1c, POC (prediabetic range): 5.7 % (ref 5.7–6.4)

## 2022-06-18 MED ORDER — BASAGLAR KWIKPEN 100 UNIT/ML ~~LOC~~ SOPN: 40.0000 [IU] | PEN_INJECTOR | Freq: Every day | SUBCUTANEOUS | 2 refills | Status: DC

## 2022-06-18 NOTE — Assessment & Plan Note (Addendum)
Symptoms are stable. Continue Wellbutrin XL 300 mg and CBT. Recommend establishing with psychiatrist.

## 2022-06-18 NOTE — Assessment & Plan Note (Signed)
HgA1C has improved,it is at goal. Having some episodes of hypoglycemia. Instructed to decrease Basaglar from 53 to 40 U daily. She can continue Novolog 5 U TID before meals prn and resume Trulicity 1.5 mg weekly. Regular exercise and healthy diet with avoidance of added sugar food intake is an important part of treatment and encouraged. Annual eye exam, periodic dental and foot care to continue. F/U in 4 months.

## 2022-06-18 NOTE — Assessment & Plan Note (Signed)
BP adequately controlled. Continue  Amlodipine 5 mg daily, Lisinopril 5 mg daily, Metoprolol 50 mg bid, and Isosorbide Mononitrate 120 mg daily. DASH/low salt diet to continue. Monitor BP at home. Eye exam is current.

## 2022-06-18 NOTE — Patient Instructions (Addendum)
A few things to remember from today's visit:  Type 2 diabetes mellitus with complication, with long-term current use of insulin ([place]) - Plan: POC HgB A1c  Essential hypertension  Recurrent major depressive disorder, in partial remission ([place]), Chronic  Today Basaglar decreased to 40 U daily. Rest unchanged. If after resuming Trulicity you have frequent blood sugars under 80,please let me know.   If you need refills for medications you take chronically, please call your pharmacy. Do not use My Chart to request refills or for acute issues that need immediate attention. If you send a my chart message, it may take a few days to be addressed, specially if I am not in the office.  Please be sure medication list is accurate. If a new problem present, please set up appointment sooner than planned today.

## 2022-06-19 LAB — MICROALBUMIN / CREATININE URINE RATIO
Creatinine,U: 69.9 mg/dL
Microalb Creat Ratio: 1.5 mg/g (ref 0.0–30.0)
Microalb, Ur: 1 mg/dL (ref 0.0–1.9)

## 2022-07-02 ENCOUNTER — Encounter: Payer: Self-pay | Admitting: Family Medicine

## 2022-07-03 ENCOUNTER — Telehealth (INDEPENDENT_AMBULATORY_CARE_PROVIDER_SITE_OTHER): Payer: Commercial Managed Care - HMO | Admitting: Family Medicine

## 2022-07-03 VITALS — Ht 66.0 in | Wt 189.0 lb

## 2022-07-03 DIAGNOSIS — R300 Dysuria: Secondary | ICD-10-CM | POA: Diagnosis not present

## 2022-07-03 MED ORDER — NITROFURANTOIN MONOHYD MACRO 100 MG PO CAPS: 100.0000 mg | ORAL_CAPSULE | Freq: Two times a day (BID) | ORAL | 0 refills | Status: AC

## 2022-07-03 NOTE — Patient Instructions (Signed)
-  I sent the medication(s) we discussed to your pharmacy: Meds ordered this encounter  Medications   nitrofurantoin, macrocrystal-monohydrate, (MACROBID) 100 MG capsule    Sig: Take 1 capsule (100 mg total) by mouth 2 (two) times daily.    Dispense:  14 capsule    Refill:  0     I hope you are feeling better soon!  Seek in person care promptly if your symptoms worsen, new concerns arise or you are not improving with treatment.  It was nice to meet you today. I help [facility] out with telemedicine visits on Tuesdays and Thursdays and am happy to help if you need a virtual follow up visit on those days. Otherwise, if you have any concerns or questions following this visit please schedule a follow up visit with your Primary Care office or seek care at a local urgent care clinic to avoid delays in care. If you are having severe or life threatening symptoms please call 911 and/or go to the nearest emergency room.

## 2022-07-03 NOTE — Progress Notes (Signed)
Virtual Visit via Video Note  I connected with Victoria Eaton  on 07/03/22 at  3:00 PM EDT by a video enabled telemedicine application and verified that I am speaking with the correct person using two identifiers.  Location patient: [place] Location provider:work or home office Persons participating in the virtual visit: patient, provider  I discussed the limitations and requested verbal permission for telemedicine visit. The patient expressed understanding and agreed to proceed.   HPI:  Acute telemedicine visit for : -Onset: 2 days ago -Symptoms include: burning with urination, frequency, urgency -Denies:fevers, abd/pelvic/flank pain, hematuria, nausea, vomiting, vaginal discharge, concerns of STI -she has had UTIs in the past and this feels similar, denies recent UTI or abx use -is requesting empiric tx as prefers to not come into office at this time -Has tried: azo -Pertinent past medical history: see below -Pertinent medication allergies: Allergies  Allergen Reactions   Januvia [Sitagliptin] Nausea And Vomiting and Other (See Comments)    Pancreatitis (this was in Epic, but patient was unaware of this)    Metformin And Related Diarrhea   Penicillins Other (See Comments)    Unknown childhood reaction Has patient had a PCN reaction causing immediate rash, facial/tongue/throat swelling, SOB or lightheadedness with hypotension: Unknown Has patient had a PCN reaction causing severe rash involving mucus membranes or skin necrosis: Unknown Has patient had a PCN reaction that required hospitalization: Unknown Has patient had a PCN reaction occurring within the last 10 years: No If all of the above answers are "NO", then may proceed with Cephalosporin use.       ROS: See pertinent positives and negatives per HPI.  Past Medical History:  Diagnosis Date   Anxiety    Arthritis    "maybe in my left foot" (10/14/2017)   Bladder prolapse, female, acquired 09/14/2017   Coronary artery disease     Depression    High cholesterol    History of stomach ulcers    Hypertension    Myocardial infarction ([facility]) 12/30/2004   Tobacco abuse    Type II diabetes mellitus ([place])     Past Surgical History:  Procedure Laterality Date   ANTERIOR AND POSTERIOR REPAIR N/A 07/09/2021   Procedure: ANTERIOR (CYSTOCELE) AND POSTERIOR REPAIR (RECTOCELE) with perineorrhaphy;  Surgeon: [name], MD;  [facility];  Service: Gynecology;  Laterality: N/A;   CARDIAC CATHETERIZATION  10/14/2017   CARPAL TUNNEL RELEASE Right 04/10/2021   Procedure: RIGHT CARPAL TUNNEL RELEASE;  Surgeon: Leandrew Koyanagi, MD;  Location: [facility];  Service: Orthopedics;  Laterality: Right;   CHOLECYSTECTOMY N/A 05/19/2022   Procedure: LAPAROSCOPIC CHOLECYSTECTOMY WITH POSSIBLE INTRAOPERATIVE CHOLANGIOGRAM;  Surgeon: Donnie Mesa, MD;  [facility];  Service: General;  Laterality: N/A;   CORONARY ANGIOPLASTY WITH STENT PLACEMENT  12/30/2004   Patient reported   CORONARY ARTERY BYPASS GRAFT N/A 10/19/2017   Procedure: CORONARY ARTERY BYPASS GRAFTING (CABG) times four using the right saphaneous vein. Harvested endoscopicly and left internal mammary artery.;  Surgeon: Grace Isaac, MD;  Location: [facility];  Service: Open Heart Surgery;  Laterality: N/A;   CYSTOSCOPY N/A 07/09/2021   Procedure: CYSTOSCOPY;  Surgeon: [name], MD;  [facility];  Service: Gynecology;  Laterality: N/A;   DILATION AND CURETTAGE OF UTERUS  1980s   LEFT HEART CATH AND CORONARY ANGIOGRAPHY N/A 10/14/2017   Procedure: LEFT HEART CATH AND CORONARY ANGIOGRAPHY;  Surgeon: Martinique, Peter M, MD;  Location: [facility];  Service: Cardiovascular;  Laterality: N/A;  LEFT HEART CATH AND CORS/GRAFTS ANGIOGRAPHY N/A 01/01/2018   Procedure: LEFT HEART CATH AND CORS/GRAFTS ANGIOGRAPHY;  Surgeon: Jettie Booze, MD;  Location: [facility];  Service: Cardiovascular;  Laterality: N/A;   LEFT HEART CATH AND CORS/GRAFTS  ANGIOGRAPHY N/A 08/16/2018   Procedure: LEFT HEART CATH AND CORS/GRAFTS ANGIOGRAPHY;  Surgeon: Nelva Bush, MD;  Location: [facility];  Service: Cardiovascular;  Laterality: N/A;   PILONIDAL CYST EXCISION  1980s   TEE WITHOUT CARDIOVERSION N/A 10/19/2017   Procedure: TRANSESOPHAGEAL ECHOCARDIOGRAM (TEE);  Surgeon: Grace Isaac, MD;  Location: [facility];  Service: Open Heart Surgery;  Laterality: N/A;   TRIGGER FINGER RELEASE Right 04/10/2021   Procedure: RIGHT LONG FINGER TRIGGER RELEASE;  Surgeon: Leandrew Koyanagi, MD;  Location: [facility];  Service: Orthopedics;  Laterality: Right;   VAGINAL HYSTERECTOMY N/A 07/09/2021   Procedure: HYSTERECTOMY VAGINAL;  Surgeon: [name], MD;  [facility];  Service: Gynecology;  Laterality: N/A;  total time requested for all procedures is 3 hours   VAGINAL PROLAPSE REPAIR N/A 07/09/2021   Procedure: VAGINAL VAULT SUSPENSION;  Surgeon: [name], MD;  [facility];  Service: Gynecology;  Laterality: N/A;     Current Outpatient Medications:    acetaminophen (TYLENOL) 500 MG tablet, Take 1 tablet (500 mg total) by mouth every 6 (six) hours as needed (pain)., Disp: 30 tablet, Rfl: 0   amLODipine (NORVASC) 5 MG tablet, TAKE ONE TABLET BY MOUTH ONE TIME DAILY, Disp: 90 tablet, Rfl: 2   buPROPion (WELLBUTRIN XL) 300 MG 24 hr tablet, TAKE ONE TABLET BY MOUTH ONE TIME DAILY (Patient taking differently: Take 300 mg by mouth daily.), Disp: 90 tablet, Rfl: 2   clopidogrel (PLAVIX) 75 MG tablet, TAKE ONE TABLET BY MOUTH ONE TIME DAILY, Disp: 90 tablet, Rfl: 2   Continuous Blood Gluc Sensor (FREESTYLE LIBRE 14 DAY SENSOR) MISC, APPLY ONE SENSOR TO THE BACK OF YOUR UPPER ARM. REPLACE EVERY 14 DAYS., Disp: 3 each, Rfl: 5   dapagliflozin propanediol (FARXIGA) 5 MG TABS tablet, TAKE ONE TABLET BY MOUTH ONE TIME DAILY BEFORE BREAKFAST (Patient taking differently: Take 5 mg by mouth daily.), Disp: 90 tablet, Rfl: 3   ezetimibe (ZETIA) 10  MG tablet, TAKE ONE TABLET BY MOUTH ONE TIME DAILY (Patient taking differently: Take 10 mg by mouth daily.), Disp: 30 tablet, Rfl: 8   insulin aspart (NOVOLOG FLEXPEN) 100 UNIT/ML FlexPen, Inject 5 Units into the skin 3 (three) times daily with meals as needed for high blood sugar., Disp: , Rfl:    Insulin Glargine (BASAGLAR KWIKPEN) 100 UNIT/ML, Inject 40 Units into the skin daily., Disp: 35 mL, Rfl: 2   Insulin Pen Needle (ULTICARE MICRO PEN NEEDLES) 32G X 4 MM MISC, USE WITH INJECTIONS FOUR TIMES A DAY, Disp: 400 each, Rfl: 3   isosorbide mononitrate (IMDUR) 120 MG 24 hr tablet, TAKE ONE TABLET BY MOUTH ONE TIME DAILY (Patient taking differently: Take 120 mg by mouth daily.), Disp: 90 tablet, Rfl: 2   lisinopril (ZESTRIL) 5 MG tablet, TAKE ONE TABLET BY MOUTH ONE TIME DAILY (Patient taking differently: Take 5 mg by mouth daily.), Disp: 90 tablet, Rfl: 1   metoprolol tartrate (LOPRESSOR) 50 MG tablet, TAKE ONE TABLET BY MOUTH TWICE A DAY (Patient taking differently: Take 50 mg by mouth 2 (two) times daily.), Disp: 180 tablet, Rfl: 2   Multiple Vitamin (MULTIVITAMIN WITH MINERALS) TABS tablet, Take 1 tablet by mouth daily., Disp: , Rfl:  nitrofurantoin, macrocrystal-monohydrate, (MACROBID) 100 MG capsule, Take 1 capsule (100 mg total) by mouth 2 (two) times daily., Disp: 14 capsule, Rfl: 0   nitroGLYCERIN (NITROSTAT) 0.4 MG SL tablet, Place 0.4 mg under the tongue every 5 (five) minutes as needed for chest pain., Disp: , Rfl:    Omega-3 Fatty Acids (FISH OIL) 1200 MG CAPS, Take 1,200 mg by mouth daily., Disp: , Rfl:    rosuvastatin (CRESTOR) 40 MG tablet, TAKE ONE TABLET BY MOUTH ONE TIME DAILY (Patient taking differently: Take 40 mg by mouth daily.), Disp: 90 tablet, Rfl: 3   sertraline (ZOLOFT) 100 MG tablet, Take 100 mg by mouth daily. 2 tabs at bedtime, Disp: , Rfl:    sulfaSALAzine (AZULFIDINE) 500 MG EC tablet, Take 2 tablets (1,000 mg total) by mouth 2 (two) times daily., Disp: 120 tablet,  Rfl: 11   TRULICITY 1.5 XI/3.5WY SOPN, INJECT THE CONTENTS OF ONE PEN UNDER THE SKIN WEEKLY ON THE SAME DAY EACH WEEK (Patient taking differently: Inject 1.5 mg into the skin once a week.), Disp: 2 mL, Rfl: 6   Turmeric 500 MG CAPS, Take 1,000 mg by mouth daily., Disp: , Rfl:   EXAM:  VITALS per patient if applicable:  GENERAL: alert, oriented, appears well and in no acute distress  HEENT: atraumatic, conjunttiva clear, no obvious abnormalities on inspection of external nose and ears  NECK: normal movements of the head and neck  LUNGS: on inspection no signs of respiratory distress, breathing rate appears normal, no obvious gross SOB, gasping or wheezing  CV: no obvious cyanosis  MS: moves all visible extremities without noticeable abnormality  PSYCH/NEURO: pleasant and cooperative, no obvious depression or anxiety, speech and thought processing grossly intact  ASSESSMENT AND PLAN:  Discussed the following assessment and plan:  Dysuria  -we discussed possible serious and likely etiologies, options for evaluation and workup, limitations of telemedicine visit vs in person visit, treatment, treatment risks and precautions. Pt is agreeable to treatment via telemedicine at this moment. Possible UTI vs other. Advised exam and urine studies could provide a more definitive dx. She prefers to try empiric treatment with macrobid and agrees to seek inperson care if worsening, new symptoms arise, or if symptoms do not resolve with treatment. Discussed options for follow up care. Did let this patient know that I do telemedicine on Tuesdays and Thursdays for [facility] and those are the days I am logged into the system. Advised to schedule follow up visit with PCP, [facility] virtual visits or [facility] if any further questions or concerns to avoid delays in care.   I discussed the assessment and treatment plan with the patient. The patient was provided an opportunity to ask questions and all were answered.  The patient agreed with the plan and demonstrated an understanding of the instructions.     Lucretia Kern, DO

## 2022-07-14 ENCOUNTER — Encounter: Payer: Self-pay | Admitting: *Deleted

## 2022-07-14 NOTE — Telephone Encounter (Signed)
I called [employer] to check the appeal they informed it is still pending it could take up to 75 days from 06/12/22 when the received it.

## 2022-07-23 ENCOUNTER — Encounter: Payer: Self-pay | Admitting: Neurology

## 2022-08-22 ENCOUNTER — Ambulatory Visit: Payer: Commercial Managed Care - HMO | Admitting: Orthopaedic Surgery

## 2022-08-26 ENCOUNTER — Ambulatory Visit: Payer: Commercial Managed Care - HMO | Admitting: Orthopaedic Surgery

## 2022-08-26 DIAGNOSIS — M65312 Trigger thumb, left thumb: Secondary | ICD-10-CM

## 2022-08-26 DIAGNOSIS — M65311 Trigger thumb, right thumb: Secondary | ICD-10-CM | POA: Diagnosis not present

## 2022-08-26 MED ORDER — PREDNISONE 5 MG (21) PO TBPK: ORAL_TABLET | ORAL | 0 refills | Status: AC

## 2022-08-26 NOTE — Progress Notes (Signed)
Office Visit Note   [name]           Date of Birth: Aug 24, 1961           MRN: [id number] Visit Date: 08/26/2022              Requested by: Martinique, Betty G, MD [address] [place],  [place] [address] PCP: Martinique, Betty G, MD   Assessment & Plan: Visit Diagnoses:  1. Bilateral trigger thumb     Plan: Impression is bilateral thumb trigger finger and right thumb CMC arthritis.  Today, we discussed various treatment options to include cortisone injection versus surgical intervention versus trying another steroid pack.  She is not interested in injections or surgery at this time and would like to try another steroid pack.  I sent this in.  We have also provided her with bilateral thumb night splints.  Follow-up as needed.  Follow-Up Instructions: Return if symptoms worsen or fail to improve.   Orders:  No orders of the defined types were placed in this encounter.  Meds ordered this encounter  Medications   predniSONE (STERAPRED UNI-PAK 21 TAB) 5 MG (21) TBPK tablet    Sig: Take as directed    Dispense:  21 tablet    Refill:  0      Procedures: No procedures performed   Clinical Data: No additional findings.   Subjective: Chief Complaint  Patient presents with   Right Hand - Pain   Left Hand - Pain    HPI patient is a pleasant 61 year old female who comes in today with bilateral thumb pain and triggering right greater than left for the past several months.  Symptoms are progressively worsened.  She was seen in our office for this in addition to [facility] arthritis back in July.  She started on Dosepak which has temporarily helped.  Of note, she has a history of right middle trigger finger that was previously injected with cortisone followed by surgical intervention.    Review of Systems as detailed in HPI.  All others reviewed and are negative.   Objective: Vital Signs: There were no vitals taken for this visit.  Physical Exam well-developed  well-nourished female no acute distress.  Alert and oriented x3.  Ortho Exam right thumb exam shows a moderately tender and palpable nodule at the A1 pulley.  She does have reproducible triggering.  Pain with grind test.  Left thumb exam shows small and palpable nodule that is tender to the A1 pulley.  Reproducible triggering.  She is neurovascular intact distally.  Specialty Comments:  No specialty comments available.  Imaging: No new imaging   PMFS History: Patient Active Problem List   Diagnosis Date Noted   Biliary colic 10/62/6948   Chronic fatigue 04/28/2022   Excessive daytime sleepiness 04/28/2022   Snoring 04/28/2022   Coronary artery disease of bypass graft of native heart with stable angina pectoris ([facility]) 12/19/2021   Chronic diarrhea 09/06/2021   Carpal tunnel syndrome on right    Trigger finger, right middle finger    Complete uterine prolapse 03/09/2021   Non-ST elevation (NSTEMI) myocardial infarction ([facility]) 02/17/2018   Type 2 diabetes mellitus with complication, with long-term current use of insulin ([facility]) 02/17/2018   Chest pain 01/01/2018   Unstable angina ([facility] [facility]) 01/01/2018   Arm contusion, left, initial encounter 11/25/2017   Contusion of right knee 11/25/2017   Hx of CABG 10/19/2017   Epigastric pain    Pancreatitis 10/10/2017   Hyperlipidemia LDL goal <  70 09/14/2017   Essential hypertension 05/01/2017   Major depression, recurrent ([place]) 05/01/2017   Tobacco abuse 05/01/2017   History of MI (myocardial infarction) 05/01/2017   Allergic rhinitis 05/01/2017   History of arthritis 05/01/2017   Anxiety disorder, unspecified 05/01/2017   Bladder prolapse, female, acquired 11/09/2013   Past Medical History:  Diagnosis Date   Anxiety    Arthritis    "maybe in my left foot" (10/14/2017)   Bladder prolapse, female, acquired 09/14/2017   Coronary artery disease    Depression    High cholesterol    History of stomach ulcers    Hypertension    Myocardial  infarction ([facility]) 12/30/2004   Tobacco abuse    Type II diabetes mellitus ([facility])     Family History  Problem Relation Age of Onset   Heart disease Father    Hyperlipidemia Father    Hypertension Father    Colon cancer Neg Hx    Esophageal cancer Neg Hx    Stomach cancer Neg Hx    Rectal cancer Neg Hx     Past Surgical History:  Procedure Laterality Date   ANTERIOR AND POSTERIOR REPAIR N/A 07/09/2021   Procedure: ANTERIOR (CYSTOCELE) AND POSTERIOR REPAIR (RECTOCELE) with perineorrhaphy;  Surgeon: [name], MD;  [facility];  Service: Gynecology;  Laterality: N/A;   CARDIAC CATHETERIZATION  10/14/2017   CARPAL TUNNEL RELEASE Right 04/10/2021   Procedure: RIGHT CARPAL TUNNEL RELEASE;  Surgeon: Leandrew Koyanagi, MD;  Location: [facility];  Service: Orthopedics;  Laterality: Right;   CHOLECYSTECTOMY N/A 05/19/2022   Procedure: LAPAROSCOPIC CHOLECYSTECTOMY WITH POSSIBLE INTRAOPERATIVE CHOLANGIOGRAM;  Surgeon: Donnie Mesa, MD;  [facility];  Service: General;  Laterality: N/A;   CORONARY ANGIOPLASTY WITH STENT PLACEMENT  12/30/2004   Patient reported   CORONARY ARTERY BYPASS GRAFT N/A 10/19/2017   Procedure: CORONARY ARTERY BYPASS GRAFTING (CABG) times four using the right saphaneous vein. Harvested endoscopicly and left internal mammary artery.;  Surgeon: Grace Isaac, MD;  Location: [facility];  Service: Open Heart Surgery;  Laterality: N/A;   CYSTOSCOPY N/A 07/09/2021   Procedure: CYSTOSCOPY;  Surgeon: [name], MD;  [facility];  Service: Gynecology;  Laterality: N/A;   DILATION AND CURETTAGE OF UTERUS  1980s   LEFT HEART CATH AND CORONARY ANGIOGRAPHY N/A 10/14/2017   Procedure: LEFT HEART CATH AND CORONARY ANGIOGRAPHY;  Surgeon: Martinique, Peter M, MD;  Location: [facility];  Service: Cardiovascular;  Laterality: N/A;   LEFT HEART CATH AND CORS/GRAFTS ANGIOGRAPHY N/A 01/01/2018   Procedure: LEFT HEART CATH AND CORS/GRAFTS ANGIOGRAPHY;  Surgeon:  Jettie Booze, MD;  Location: [facility];  Service: Cardiovascular;  Laterality: N/A;   LEFT HEART CATH AND CORS/GRAFTS ANGIOGRAPHY N/A 08/16/2018   Procedure: LEFT HEART CATH AND CORS/GRAFTS ANGIOGRAPHY;  Surgeon: Nelva Bush, MD;  Location: [facility];  Service: Cardiovascular;  Laterality: N/A;   PILONIDAL CYST EXCISION  1980s   TEE WITHOUT CARDIOVERSION N/A 10/19/2017   Procedure: TRANSESOPHAGEAL ECHOCARDIOGRAM (TEE);  Surgeon: Grace Isaac, MD;  Location: [facility];  Service: Open Heart Surgery;  Laterality: N/A;   TRIGGER FINGER RELEASE Right 04/10/2021   Procedure: RIGHT LONG FINGER TRIGGER RELEASE;  Surgeon: Leandrew Koyanagi, MD;  Location: [facility];  Service: Orthopedics;  Laterality: Right;   VAGINAL HYSTERECTOMY N/A 07/09/2021   Procedure: HYSTERECTOMY VAGINAL;  Surgeon: [name], MD;  [facility];  Service: Gynecology;  Laterality: N/A;  total time requested for  all procedures is 3 hours   VAGINAL PROLAPSE REPAIR N/A 07/09/2021   Procedure: VAGINAL VAULT SUSPENSION;  Surgeon: [name], MD;  [facility];  Service: Gynecology;  Laterality: N/A;   Social History   Occupational History   Not on file  Tobacco Use   Smoking status: Some Days    Packs/day: 0.75    Years: 45.00    Total pack years: 33.75    Types: Cigarettes    Start date: 05/01/1972    Last attempt to quit: 09/30/2017    Years since quitting: 4.9   Smokeless tobacco: Never  Vaping Use   Vaping Use: Never used  Substance and Sexual Activity   Alcohol use: Yes    Alcohol/week: 7.0 standard drinks of alcohol    Types: 7 Standard drinks or equivalent per week    Comment: daily: 2-3 wine   Drug use: No   Sexual activity: Not Currently

## 2022-09-10 ENCOUNTER — Other Ambulatory Visit: Payer: Self-pay | Admitting: Family Medicine

## 2022-09-10 DIAGNOSIS — E118 Type 2 diabetes mellitus with unspecified complications: Secondary | ICD-10-CM

## 2022-10-07 ENCOUNTER — Encounter: Payer: Self-pay | Admitting: Family Medicine

## 2022-10-07 NOTE — Telephone Encounter (Signed)
The insurance covers Ozempic and [place] with PA's, but we may run into the same situation where she can't find them.

## 2022-10-15 MED ORDER — SEMAGLUTIDE (1 MG/DOSE) 4 MG/3ML ~~LOC~~ SOPN: 1.0000 mg | PEN_INJECTOR | SUBCUTANEOUS | 3 refills | Status: DC

## 2022-10-20 ENCOUNTER — Telehealth: Payer: Self-pay

## 2022-10-20 NOTE — Progress Notes (Deleted)
ACUTE VISIT No chief complaint on file.  HPI: [name] is a 62 y.o. female, who is here today complaining of *** HPI  Review of Systems See other pertinent positives and negatives in HPI.  Current Outpatient Medications on File Prior to Visit  Medication Sig Dispense Refill   acetaminophen (TYLENOL) 500 MG tablet Take 1 tablet (500 mg total) by mouth every 6 (six) hours as needed (pain). 30 tablet 0   amLODipine (NORVASC) 5 MG tablet TAKE ONE TABLET BY MOUTH ONE TIME DAILY 90 tablet 2   buPROPion (WELLBUTRIN XL) 300 MG 24 hr tablet TAKE ONE TABLET BY MOUTH ONE TIME DAILY (Patient taking differently: Take 300 mg by mouth daily.) 90 tablet 2   clopidogrel (PLAVIX) 75 MG tablet TAKE ONE TABLET BY MOUTH ONE TIME DAILY 90 tablet 2   Continuous Blood Gluc Sensor (FREESTYLE LIBRE 14 DAY SENSOR) MISC APPLY ONE SENSOR TO THE BACK OF YOUR UPPER ARM. REPLACE EVERY 14 DAYS. 3 each 5   dapagliflozin propanediol (FARXIGA) 5 MG TABS tablet TAKE ONE TABLET BY MOUTH ONE TIME DAILY BEFORE BREAKFAST (Patient taking differently: Take 5 mg by mouth daily.) 90 tablet 3   ezetimibe (ZETIA) 10 MG tablet TAKE ONE TABLET BY MOUTH ONE TIME DAILY (Patient taking differently: Take 10 mg by mouth daily.) 30 tablet 8   insulin aspart (NOVOLOG FLEXPEN) 100 UNIT/ML FlexPen Inject 5 Units into the skin 3 (three) times daily with meals as needed for high blood sugar.     Insulin Glargine (BASAGLAR KWIKPEN) 100 UNIT/ML Inject 40 Units into the skin daily. 35 mL 2   Insulin Pen Needle (ULTICARE MICRO PEN NEEDLES) 32G X 4 MM MISC USE WITH INJECTIONS FOUR TIMES A DAY 400 each 3   isosorbide mononitrate (IMDUR) 120 MG 24 hr tablet TAKE ONE TABLET BY MOUTH ONE TIME DAILY (Patient taking differently: Take 120 mg by mouth daily.) 90 tablet 2   lisinopril (ZESTRIL) 5 MG tablet TAKE ONE TABLET BY MOUTH ONE TIME DAILY (Patient taking differently: Take 5 mg by mouth daily.) 90 tablet 1   metoprolol tartrate (LOPRESSOR) 50 MG  tablet TAKE ONE TABLET BY MOUTH TWICE A DAY (Patient taking differently: Take 50 mg by mouth 2 (two) times daily.) 180 tablet 2   Multiple Vitamin (MULTIVITAMIN WITH MINERALS) TABS tablet Take 1 tablet by mouth daily.     nitrofurantoin, macrocrystal-monohydrate, (MACROBID) 100 MG capsule Take 1 capsule (100 mg total) by mouth 2 (two) times daily. 14 capsule 0   nitroGLYCERIN (NITROSTAT) 0.4 MG SL tablet Place 0.4 mg under the tongue every 5 (five) minutes as needed for chest pain.     Omega-3 Fatty Acids (FISH OIL) 1200 MG CAPS Take 1,200 mg by mouth daily.     predniSONE (STERAPRED UNI-PAK 21 TAB) 5 MG (21) TBPK tablet Take as directed 21 tablet 0   rosuvastatin (CRESTOR) 40 MG tablet TAKE ONE TABLET BY MOUTH ONE TIME DAILY (Patient taking differently: Take 40 mg by mouth daily.) 90 tablet 3   Semaglutide, 1 MG/DOSE, 4 MG/3ML SOPN Inject 1 mg as directed once a week. 3 mL 3   sertraline (ZOLOFT) 100 MG tablet Take 100 mg by mouth daily. 2 tabs at bedtime     sulfaSALAzine (AZULFIDINE) 500 MG EC tablet Take 2 tablets (1,000 mg total) by mouth 2 (two) times daily. 120 tablet 11   Turmeric 500 MG CAPS Take 1,000 mg by mouth daily.     No current facility-administered medications on file  prior to visit.    Past Medical History:  Diagnosis Date   Anxiety    Arthritis    "maybe in my left foot" (10/14/2017)   Bladder prolapse, female, acquired 09/14/2017   Coronary artery disease    Depression    High cholesterol    History of stomach ulcers    Hypertension    Myocardial infarction ([place]) 12/30/2004   Tobacco abuse    Type II diabetes mellitus ([facility])    Allergies  Allergen Reactions   Januvia [Sitagliptin] Nausea And Vomiting and Other (See Comments)    Pancreatitis (this was in Epic, but patient was unaware of this)    Metformin And Related Diarrhea   Penicillins Other (See Comments)    Unknown childhood reaction Has patient had a PCN reaction causing immediate rash,  facial/tongue/throat swelling, SOB or lightheadedness with hypotension: Unknown Has patient had a PCN reaction causing severe rash involving mucus membranes or skin necrosis: Unknown Has patient had a PCN reaction that required hospitalization: Unknown Has patient had a PCN reaction occurring within the last 10 years: No If all of the above answers are "NO", then may proceed with Cephalosporin use.     Social History   Socioeconomic History   Marital status: Widowed    Spouse name: Victoria Eaton   Number of children: 1   Years of education: Not on file   Highest education level: High school graduate  Occupational History   Not on file  Tobacco Use   Smoking status: Some Days    Packs/day: 0.75    Years: 45.00    Total pack years: 33.75    Types: Cigarettes    Start date: 05/01/1972    Last attempt to quit: 09/30/2017    Years since quitting: 5.0   Smokeless tobacco: Never  Vaping Use   Vaping Use: Never used  Substance and Sexual Activity   Alcohol use: Yes    Alcohol/week: 7.0 standard drinks of alcohol    Types: 7 Standard drinks or equivalent per week    Comment: daily: 2-3 wine   Drug use: No   Sexual activity: Not Currently  Other Topics Concern   Not on file  Social History Narrative   Lives with sister   R handed   Caffeine: couple coups of coffee and a bottle of diet coke a day   Social Determinants of Radio broadcast assistant Strain: Not on file  Food Insecurity: Not on file  Transportation Needs: Not on file  Physical Activity: Not on file  Stress: Not on file  Social Connections: Not on file    There were no vitals filed for this visit. There is no height or weight on file to calculate BMI.  Physical Exam  ASSESSMENT AND PLAN: There are no diagnoses linked to this encounter.  No follow-ups on file.  Victoria G. Martinique, MD  [facility]. [place] office.  Discharge Instructions   None

## 2022-10-20 NOTE — Telephone Encounter (Signed)
This has already been completed. 

## 2022-10-21 ENCOUNTER — Ambulatory Visit: Payer: Commercial Managed Care - HMO | Admitting: Family Medicine

## 2022-10-27 MED ORDER — RYBELSUS 7 MG PO TABS: 7.0000 mg | ORAL_TABLET | Freq: Every day | ORAL | 3 refills | Status: DC

## 2022-10-27 NOTE — Addendum Note (Signed)
Addended by: Rodrigo Ran on: 10/27/2022 07:25 AM   Modules accepted: Orders

## 2022-11-27 ENCOUNTER — Other Ambulatory Visit: Payer: Self-pay | Admitting: Family Medicine

## 2022-11-27 ENCOUNTER — Encounter: Payer: Self-pay | Admitting: Family Medicine

## 2022-11-27 DIAGNOSIS — E118 Type 2 diabetes mellitus with unspecified complications: Secondary | ICD-10-CM

## 2022-11-28 ENCOUNTER — Encounter: Payer: Self-pay | Admitting: Family Medicine

## 2022-11-28 MED ORDER — SEMAGLUTIDE (1 MG/DOSE) 4 MG/3ML ~~LOC~~ SOPN: 1.0000 mg | PEN_INJECTOR | SUBCUTANEOUS | 2 refills | Status: AC

## 2022-11-28 NOTE — Addendum Note (Signed)
Addended by: Rodrigo Ran on: 11/28/2022 12:36 PM   Modules accepted: Orders

## 2022-12-01 NOTE — Telephone Encounter (Signed)
Coverage options that are more affordable, Farxiga, Januiva, and [place] are all tier 3 so they will be the same price.  Covered alternatives with lower costs: Glimepiride Glipizide Glyburide Pioglitazone Metformin

## 2022-12-08 MED ORDER — PIOGLITAZONE HCL 15 MG PO TABS: 15.0000 mg | ORAL_TABLET | Freq: Every day | ORAL | 2 refills | Status: DC

## 2022-12-08 NOTE — Addendum Note (Signed)
Addended by: Rodrigo Ran on: 12/08/2022 06:54 AM   Modules accepted: Orders

## 2022-12-09 ENCOUNTER — Other Ambulatory Visit: Payer: Self-pay | Admitting: Gastroenterology

## 2022-12-09 ENCOUNTER — Other Ambulatory Visit: Payer: Self-pay | Admitting: Internal Medicine

## 2022-12-09 ENCOUNTER — Other Ambulatory Visit: Payer: Self-pay | Admitting: Nurse Practitioner

## 2022-12-09 ENCOUNTER — Other Ambulatory Visit: Payer: Self-pay | Admitting: Family Medicine

## 2022-12-09 DIAGNOSIS — I10 Essential (primary) hypertension: Secondary | ICD-10-CM

## 2022-12-09 DIAGNOSIS — E118 Type 2 diabetes mellitus with unspecified complications: Secondary | ICD-10-CM

## 2022-12-10 NOTE — Telephone Encounter (Signed)
Please make overdue appt for future refills. Thank you 1st attempt

## 2022-12-31 ENCOUNTER — Encounter: Payer: Self-pay | Admitting: Acute Care

## 2023-01-01 ENCOUNTER — Inpatient Hospital Stay: Admission: RE | Admit: 2023-01-01 | Payer: Commercial Managed Care - HMO | Source: Ambulatory Visit

## 2023-01-01 ENCOUNTER — Other Ambulatory Visit: Payer: Self-pay | Admitting: Acute Care

## 2023-01-01 DIAGNOSIS — Z87891 Personal history of nicotine dependence: Secondary | ICD-10-CM

## 2023-01-01 DIAGNOSIS — Z122 Encounter for screening for malignant neoplasm of respiratory organs: Secondary | ICD-10-CM

## 2023-01-01 DIAGNOSIS — F17210 Nicotine dependence, cigarettes, uncomplicated: Secondary | ICD-10-CM

## 2023-01-14 ENCOUNTER — Other Ambulatory Visit: Payer: Self-pay | Admitting: Nurse Practitioner

## 2023-01-22 ENCOUNTER — Telehealth: Payer: Self-pay | Admitting: Acute Care

## 2023-01-22 DIAGNOSIS — Z87891 Personal history of nicotine dependence: Secondary | ICD-10-CM

## 2023-01-22 DIAGNOSIS — Z122 Encounter for screening for malignant neoplasm of respiratory organs: Secondary | ICD-10-CM

## 2023-01-22 NOTE — Telephone Encounter (Signed)
Results from [facility] for LDCT faxed to our office.  Kandice Robinsons, NP reviewed results and ok to release to patient. Lung Rads2 with no suspicious findings for lung cancer.  Atherosclerosis and emphysema noted.  Patient is on statin medication.  Results will be scanned into chart.  Order placed for 2025 LDCT.

## 2023-02-03 ENCOUNTER — Other Ambulatory Visit: Payer: BLUE CROSS/BLUE SHIELD

## 2023-02-13 ENCOUNTER — Other Ambulatory Visit: Payer: Self-pay | Admitting: Family Medicine

## 2023-02-13 DIAGNOSIS — I214 Non-ST elevation (NSTEMI) myocardial infarction: Secondary | ICD-10-CM

## 2023-02-17 ENCOUNTER — Other Ambulatory Visit: Payer: Self-pay

## 2023-02-17 ENCOUNTER — Other Ambulatory Visit: Payer: Self-pay | Admitting: *Deleted

## 2023-02-17 ENCOUNTER — Telehealth: Payer: Self-pay | Admitting: Internal Medicine

## 2023-02-17 ENCOUNTER — Encounter (HOSPITAL_BASED_OUTPATIENT_CLINIC_OR_DEPARTMENT_OTHER): Payer: Self-pay | Admitting: Internal Medicine

## 2023-02-17 MED ORDER — METOPROLOL TARTRATE 50 MG PO TABS: 50.0000 mg | ORAL_TABLET | Freq: Two times a day (BID) | ORAL | 0 refills | Status: DC

## 2023-02-17 MED ORDER — ISOSORBIDE MONONITRATE ER 120 MG PO TB24: 120.0000 mg | ORAL_TABLET | Freq: Every day | ORAL | 0 refills | Status: DC

## 2023-02-17 NOTE — Telephone Encounter (Signed)
Spoke to patient she stated she has been out of Metoprolol and Lisinopril.Advised to take medications as prescribed.Monitor B/P daily.Bring readings to appointment already scheduled with Joni Reining 6/17 at 10:30 am.

## 2023-02-17 NOTE — Telephone Encounter (Signed)
Patient c/o Palpitations:  High priority if patient c/o lightheadedness, shortness of breath, or chest pain  How long have you had palpitations/irregular HR/ Afib? Are you having the symptoms now?   No  Are you currently experiencing lightheadedness, SOB or CP?   No  Do you have a history of afib (atrial fibrillation) or irregular heart rhythm?  Yes  Have you checked your BP or HR? (document readings if available):  BP 156/85  HR 101  Are you experiencing any other symptoms?  Headache  Patient stated she has been very shaky the last couple of days.  Patient says she has diabetes but her blood pressure has been good.  Patient stated she had tightening in her neck which went away and her heart was beating fast.

## 2023-02-17 NOTE — Telephone Encounter (Signed)
LVM to call our office.

## 2023-02-17 NOTE — Telephone Encounter (Signed)
*  STAT* If patient is at the pharmacy, call can be transferred to refill team.   1. Which medications need to be refilled? (please list name of each medication and dose if known)   metoprolol tartrate (LOPRESSOR) 50 MG tablet   2. Which pharmacy/location (including street and city if local pharmacy) is medication to be sent to?  [facility] [address] [place], [place] - [address]. AT [facility] [address]   3. Do they need a 30 day or 90 day supply?   90 day  Patient stated she is completely out of this medication for a couple of weeks.  Patient has appointment scheduled on 6/17.

## 2023-02-18 NOTE — Telephone Encounter (Signed)
This was addressed with patient via my chart.

## 2023-02-26 NOTE — Progress Notes (Deleted)
Cardiology Clinic Note   Patient Name: Victoria Eaton Date of Encounter: 02/26/2023  Primary Care Provider:  [place], Betty G, MD Primary Cardiologist:  Chrystie Nose, MD  Patient Profile    62 year old female we are following for ongoing assessment and management of coronary artery disease, status post CABG x 4 on 09/2017 (LIMA to LAD (patent), SVG to RCA (patent), SVG to diagonal 1 (CTO and SVG to OM1 (CTO as of 07/2018; hypertension, hyperlipidemia, type 2 diabetes, and ongoing tobacco abuse.  Last seen by Dr. Rennis Golden on 10/22/2020.  At that time the patient was stable from a cardiac standpoint but unfortunately continues to smoke, was having significant depression because of death of parents and has been within a very short period of time.  She was seeking counseling.  Past Medical History    Past Medical History:  Diagnosis Date   Anxiety    Arthritis    "maybe in my left foot" (10/14/2017)   Bladder prolapse, female, acquired 09/14/2017   Coronary artery disease    Depression    High cholesterol    History of stomach ulcers    Hypertension    Myocardial infarction (HCC) 12/30/2004   Tobacco abuse    Type II diabetes mellitus (HCC)    Past Surgical History:  Procedure Laterality Date   ANTERIOR AND POSTERIOR REPAIR N/A 07/09/2021   Procedure: ANTERIOR (CYSTOCELE) AND POSTERIOR REPAIR (RECTOCELE) with perineorrhaphy;  Surgeon: Marguerita Beards, MD;  Location: [facility];  Service: Gynecology;  Laterality: N/A;   CARDIAC CATHETERIZATION  10/14/2017   CARPAL TUNNEL RELEASE Right 04/10/2021   Procedure: RIGHT CARPAL TUNNEL RELEASE;  Surgeon: Tarry Kos, MD;  Location: [facility] OR;  Service: Orthopedics;  Laterality: Right;   CHOLECYSTECTOMY N/A 05/19/2022   Procedure: LAPAROSCOPIC CHOLECYSTECTOMY WITH POSSIBLE INTRAOPERATIVE CHOLANGIOGRAM;  Surgeon: Manus Rudd, MD;  Location: [facility];  Service: General;  Laterality: N/A;   CORONARY ANGIOPLASTY WITH STENT PLACEMENT  12/30/2004    Patient reported   CORONARY ARTERY BYPASS GRAFT N/A 10/19/2017   Procedure: CORONARY ARTERY BYPASS GRAFTING (CABG) times four using the right saphaneous vein. Harvested endoscopicly and left internal mammary artery.;  Surgeon: Delight Ovens, MD;  Location: [facility] OR;  Service: Open Heart Surgery;  Laterality: N/A;   CYSTOSCOPY N/A 07/09/2021   Procedure: CYSTOSCOPY;  Surgeon: Marguerita Beards, MD;  Location: [facility];  Service: Gynecology;  Laterality: N/A;   DILATION AND CURETTAGE OF UTERUS  1980s   LEFT HEART CATH AND CORONARY ANGIOGRAPHY N/A 10/14/2017   Procedure: LEFT HEART CATH AND CORONARY ANGIOGRAPHY;  Surgeon: Swazil[place], Peter M, MD;  Location: [facility] INVASIVE CV LAB;  Service: Cardiovascular;  Laterality: N/A;   LEFT HEART CATH AND CORS/GRAFTS ANGIOGRAPHY N/A 01/01/2018   Procedure: LEFT HEART CATH AND CORS/GRAFTS ANGIOGRAPHY;  Surgeon: Corky Crafts, MD;  Location: [facility] INVASIVE CV LAB;  Service: Cardiovascular;  Laterality: N/A;   LEFT HEART CATH AND CORS/GRAFTS ANGIOGRAPHY N/A 08/16/2018   Procedure: LEFT HEART CATH AND CORS/GRAFTS ANGIOGRAPHY;  Surgeon: Yvonne Kendall, MD;  Location: [facility] INVASIVE CV LAB;  Service: Cardiovascular;  Laterality: N/A;   PILONIDAL CYST EXCISION  1980s   TEE WITHOUT CARDIOVERSION N/A 10/19/2017   Procedure: TRANSESOPHAGEAL ECHOCARDIOGRAM (TEE);  Surgeon: Delight Ovens, MD;  Location: [facility] OR;  Service: Open Heart Surgery;  Laterality: N/A;   TRIGGER FINGER RELEASE Right 04/10/2021   Procedure: RIGHT LONG FINGER TRIGGER RELEASE;  Surgeon: Tarry Kos, MD;  Location: [facility] OR;  Service: Orthopedics;  Laterality: Right;  VAGINAL HYSTERECTOMY N/A 07/09/2021   Procedure: HYSTERECTOMY VAGINAL;  Surgeon: Marguerita Beards, MD;  Location: [facility];  Service: Gynecology;  Laterality: N/A;  total time requested for all procedures is 3 hours   VAGINAL PROLAPSE REPAIR N/A 07/09/2021   Procedure: VAGINAL VAULT SUSPENSION;  Surgeon: Marguerita Beards, MD;   Location: [facility];  Service: Gynecology;  Laterality: N/A;    Allergies  Allergies  Allergen Reactions   Januvia [Sitagliptin] Nausea And Vomiting and Other (See Comments)    Pancreatitis (this was in Epic, but patient was unaware of this)    Metformin And Related Diarrhea   Penicillins Other (See Comments)    Unknown childhood reaction Has patient had a PCN reaction causing immediate rash, facial/tongue/throat swelling, SOB or lightheadedness with hypotension: Unknown Has patient had a PCN reaction causing severe rash involving mucus membranes or skin necrosis: Unknown Has patient had a PCN reaction that required hospitalization: Unknown Has patient had a PCN reaction occurring within the last 10 years: No If all of the above answers are "NO", then may proceed with Cephalosporin use.     History of Present Illness    Victoria Eaton is a 62 year old female we are following for coronary artery disease, history of CABG x 2, hyperlipidemia, and hypertension.  The patient called our office on 02/17/2023 with complaints of palpitations.  Blood pressure was reported at 156/85 with a heart rate of 101.  She was complaining of feeling "shaky over the last couple of days", complaints of tightening in her neck while her heart was beating fast.  An appointment was made.  Home Medications    Current Outpatient Medications  Medication Sig Dispense Refill   acetaminophen (TYLENOL) 500 MG tablet Take 1 tablet (500 mg total) by mouth every 6 (six) hours as needed (pain). 30 tablet 0   amLODipine (NORVASC) 5 MG tablet TAKE ONE TABLET BY MOUTH ONE TIME DAILY 90 tablet 2   buPROPion (WELLBUTRIN XL) 300 MG 24 hr tablet TAKE ONE TABLET BY MOUTH ONE TIME DAILY (Patient taking differently: Take 300 mg by mouth daily.) 90 tablet 2   clopidogrel (PLAVIX) 75 MG tablet TAKE ONE TABLET BY MOUTH ONE TIME DAILY 90 tablet 0   Continuous Blood Gluc Sensor (FREESTYLE LIBRE 14 DAY SENSOR) MISC APPLY ONE SENSOR TO THE  BACK OF YOUR UPPER ARM. REPLACE EVERY 14 DAYS. 3 each 5   ezetimibe (ZETIA) 10 MG tablet TAKE ONE TABLET BY MOUTH ONE TIME DAILY 30 tablet 6   insulin aspart (NOVOLOG FLEXPEN) 100 UNIT/ML FlexPen Inject 5 Units into the skin 3 (three) times daily with meals as needed for high blood sugar.     Insulin Glargine (BASAGLAR KWIKPEN) 100 UNIT/ML INJECT 53 UNITS INTO THE SKIN DAILY 45 mL 3   Insulin Pen Needle (ULTICARE MICRO PEN NEEDLES) 32G X 4 MM MISC USE ONE PEN NEEDLE FOUR TIMES DAILY AS DIRECTED 400 each 3   isosorbide mononitrate (IMDUR) 120 MG 24 hr tablet Take 1 tablet (120 mg total) by mouth daily. PATIENT MUST SCHEDULE APPOINTMENT FOR FUTURE REFILLS FIRST ATTEMPT 30 tablet 0   lisinopril (ZESTRIL) 5 MG tablet TAKE ONE TABLET BY MOUTH ONE TIME DAILY 90 tablet 1   metoprolol tartrate (LOPRESSOR) 50 MG tablet Take 1 tablet (50 mg total) by mouth 2 (two) times daily. 180 tablet 0   Multiple Vitamin (MULTIVITAMIN WITH MINERALS) TABS tablet Take 1 tablet by mouth daily.     nitrofurantoin, macrocrystal-monohydrate, (MACROBID) 100 MG capsule Take 1  capsule (100 mg total) by mouth 2 (two) times daily. 14 capsule 0   nitroGLYCERIN (NITROSTAT) 0.4 MG SL tablet Place 0.4 mg under the tongue every 5 (five) minutes as needed for chest pain.     Omega-3 Fatty Acids (FISH OIL) 1200 MG CAPS Take 1,200 mg by mouth daily.     pioglitazone (ACTOS) 15 MG tablet Take 1 tablet (15 mg total) by mouth daily. 90 tablet 2   predniSONE (STERAPRED UNI-PAK 21 TAB) 5 MG (21) TBPK tablet Take as directed 21 tablet 0   rosuvastatin (CRESTOR) 40 MG tablet TAKE ONE TABLET BY MOUTH ONE TIME DAILY 90 tablet 3   Semaglutide, 1 MG/DOSE, 4 MG/3ML SOPN Inject 1 mg as directed once a week. 3 mL 2   sertraline (ZOLOFT) 100 MG tablet TAKE TWO TABLETS BY MOUTH ONE TIME DAILY 180 tablet 2   sulfaSALAzine (AZULFIDINE) 500 MG EC tablet TAKE TWO TABLETS BY MOUTH TWICE A DAY 120 tablet 11   Turmeric 500 MG CAPS Take 1,000 mg by mouth daily.      No current facility-administered medications for this visit.     Family History    Family History  Problem Relation Age of Onset   Heart disease Father    Hyperlipidemia Father    Hypertension Father    Colon cancer Neg Hx    Esophageal cancer Neg Hx    Stomach cancer Neg Hx    Rectal cancer Neg Hx    She indicated that her mother is alive. She indicated that her father is alive. She indicated that her sister is alive. She indicated that the status of her neg hx is unknown.  Social History    Social History   Socioeconomic History   Marital status: Widowed    Spouse name: Baldo Ash   Number of children: 1   Years of education: Not on file   Highest education level: High school graduate  Occupational History   Not on file  Tobacco Use   Smoking status: Some Days    Packs/day: 0.75    Years: 45.00    Additional pack years: 0.00    Total pack years: 33.75    Types: Cigarettes    Start date: 05/01/1972    Last attempt to quit: 09/30/2017    Years since quitting: 5.4   Smokeless tobacco: Never  Vaping Use   Vaping Use: Never used  Substance and Sexual Activity   Alcohol use: Yes    Alcohol/week: 7.0 standard drinks of alcohol    Types: 7 Standard drinks or equivalent per week    Comment: daily: 2-3 wine   Drug use: No   Sexual activity: Not Currently  Other Topics Concern   Not on file  Social History Narrative   Lives with sister   R handed   Caffeine: couple coups of coffee and a bottle of diet coke a day   Social Determinants of Corporate investment banker Strain: Not on file  Food Insecurity: Not on file  Transportation Needs: Not on file  Physical Activity: Not on file  Stress: Not on file  Social Connections: Not on file  Intimate Partner Violence: Not on file     Review of Systems    General:  No chills, fever, night sweats or weight changes.  Cardiovascular:  No chest pain, dyspnea on exertion, edema, orthopnea, palpitations, paroxysmal nocturnal  dyspnea. Dermatological: No rash, lesions/masses Respiratory: No cough, dyspnea Urologic: No hematuria, dysuria Abdominal:   No nausea,  vomiting, diarrhea, bright red blood per rectum, melena, or hematemesis Neurologic:  No visual changes, wkns, changes in mental status. All other systems reviewed and are otherwise negative except as noted above.     Physical Exam    VS:  There were no vitals taken for this visit. , BMI There is no height or weight on file to calculate BMI.     GEN: Well nourished, well developed, in no acute distress. HEENT: normal. Neck: Supple, no JVD, carotid bruits, or masses. Cardiac: RRR, no murmurs, rubs, or gallops. No clubbing, cyanosis, edema.  Radials/DP/PT 2+ and equal bilaterally.  Respiratory:  Respirations regular and unlabored, clear to auscultation bilaterally. GI: Soft, nontender, nondistended, BS + x 4. MS: no deformity or atrophy. Skin: warm and dry, no rash. Neuro:  Strength and sensation are intact. Psych: Normal affect.  Accessory Clinical Findings    ECG personally reviewed by me today- *** - No acute changes  Lab Results  Component Value Date   WBC 5.9 05/15/2022   HGB 11.7 (L) 05/15/2022   HCT 38.4 05/15/2022   MCV 96.2 05/15/2022   PLT 132 (L) 05/15/2022   Lab Results  Component Value Date   CREATININE 0.91 05/15/2022   BUN 16 05/15/2022   NA 141 05/15/2022   K 3.9 05/15/2022   CL 109 05/15/2022   CO2 27 05/15/2022   Lab Results  Component Value Date   ALT 16 05/15/2022   AST 17 05/15/2022   ALKPHOS 46 05/15/2022   BILITOT 0.5 05/15/2022   Lab Results  Component Value Date   CHOL 159 12/16/2021   HDL 72.80 12/16/2021   LDLCALC 62 12/16/2021   TRIG 119.0 12/16/2021   CHOLHDL 2 12/16/2021    Lab Results  Component Value Date   HGBA1C 5.7 06/18/2022    Review of Prior Studies: [facility] 08/16/2018 Conclusions: Severe native coronary artery disease.  Since the last catheterization in April, the distal LAD is now  occluded (supplied by patent SVG).  Otherwise, there has been no significant interval change, though distal LAD disease appears slightly less pronounced on today's angiogram. Patent LIMA-LAD and SVG-RCA. Chronically occluded SVG-D1 and SVG-OM1. Normal left ventricular systolic function and filling pressure.   Recommendations: Continue medical therapy and aggressive secondary prevention. PCI to CTO of OM1 could be considered (as previously discussed in Dr. Hoyle Barr cath report n 01/01/2018) if refractory symptoms are noted.   Recommend dual antiplatelet therapy with Aspirin 81mg  daily and Clopidogrel 75mg  daily long-term (beyond 12 months) because of extensive multivessel CAD.    Echocardiogram 10/12/2017 Left ventricle: Septal apical and mid/apical inferior wall    hypokinesis. The cavity size was moderately dilated. Wall    thickness was increased in a pattern of moderate LVH. Systolic    function was normal. The estimated ejection fraction was 40%.    Wall motion was normal; there were no regional wall motion    abnormalities. Left ventricular diastolic function parameters    were normal.  - Mitral valve: There was mild regurgitation.  - Left atrium: The atrium was mildly dilated.  - Atrial septum: A patent foramen ovale cannot be excluded.   Assessment & Plan   1.  ***     {Are you ordering a CV Procedure (e.g. stress test, cath, DCCV, TEE, etc)?   Press F2        :[id number]}   Signed, Bettey Mare. Liborio Nixon, ANP, AACC   02/26/2023 12:18 PM      Office [phone] Fax [phone]  Notice: This dictation was prepared with Dragon dictation along with smaller phrase technology. Any transcriptional errors that result from this process are unintentional and may not be corrected upon review.

## 2023-03-09 ENCOUNTER — Ambulatory Visit: Payer: BLUE CROSS/BLUE SHIELD | Admitting: Adult Health

## 2023-03-13 ENCOUNTER — Other Ambulatory Visit: Payer: Self-pay | Admitting: Internal Medicine

## 2023-04-08 ENCOUNTER — Other Ambulatory Visit: Payer: Self-pay | Admitting: Internal Medicine

## 2023-04-08 ENCOUNTER — Other Ambulatory Visit: Payer: Self-pay | Admitting: Family Medicine

## 2023-04-08 DIAGNOSIS — F3341 Major depressive disorder, recurrent, in partial remission: Secondary | ICD-10-CM

## 2023-04-26 ENCOUNTER — Other Ambulatory Visit: Payer: Self-pay | Admitting: Internal Medicine

## 2023-04-30 ENCOUNTER — Other Ambulatory Visit: Payer: Self-pay | Admitting: Internal Medicine

## 2023-05-14 ENCOUNTER — Other Ambulatory Visit: Payer: Self-pay | Admitting: Internal Medicine

## 2023-05-19 ENCOUNTER — Other Ambulatory Visit: Payer: Self-pay | Admitting: Internal Medicine

## 2023-05-28 ENCOUNTER — Other Ambulatory Visit: Payer: Self-pay | Admitting: Family Medicine

## 2023-06-16 ENCOUNTER — Other Ambulatory Visit: Payer: Self-pay

## 2023-06-16 MED ORDER — EZETIMIBE 10 MG PO TABS: 10.0000 mg | ORAL_TABLET | Freq: Every day | ORAL | 0 refills | Status: DC

## 2023-06-25 ENCOUNTER — Other Ambulatory Visit: Payer: Self-pay | Admitting: Family Medicine

## 2023-06-25 DIAGNOSIS — I10 Essential (primary) hypertension: Secondary | ICD-10-CM

## 2023-06-25 LAB — HM DIABETES EYE EXAM

## 2023-07-13 ENCOUNTER — Other Ambulatory Visit: Payer: Self-pay

## 2023-07-13 ENCOUNTER — Other Ambulatory Visit: Payer: Self-pay | Admitting: Internal Medicine

## 2023-07-13 MED ORDER — EZETIMIBE 10 MG PO TABS: 10.0000 mg | ORAL_TABLET | Freq: Every day | ORAL | 0 refills | Status: AC

## 2023-07-25 ENCOUNTER — Other Ambulatory Visit: Payer: Self-pay | Admitting: Internal Medicine

## 2023-08-24 ENCOUNTER — Telehealth: Payer: Self-pay

## 2023-08-24 MED ORDER — ISOSORBIDE MONONITRATE ER 120 MG PO TB24: 120.0000 mg | ORAL_TABLET | Freq: Every day | ORAL | 0 refills | Status: DC

## 2023-08-24 NOTE — Telephone Encounter (Signed)
Pt of Dr. Blanchie Dessert her pharmacy faxed over a reorder for Isosorbide Monoitrate. She is passed her third attempt. What would Dr. Rennis Golden like to do? Please advise.

## 2023-09-14 ENCOUNTER — Encounter: Payer: Self-pay | Admitting: Family Medicine

## 2023-09-14 NOTE — Telephone Encounter (Signed)
 Care team updated and letter sent for eye exam notes.

## 2023-09-19 ENCOUNTER — Other Ambulatory Visit: Payer: Self-pay | Admitting: Family Medicine

## 2023-09-19 DIAGNOSIS — I10 Essential (primary) hypertension: Secondary | ICD-10-CM

## 2023-09-23 ENCOUNTER — Other Ambulatory Visit: Payer: Self-pay | Admitting: Internal Medicine

## 2023-09-24 NOTE — Telephone Encounter (Signed)
 Please contact pt for future appointment. Pt overdue for 12 month f/u. Pt hasn't been seen since 2022.

## 2023-10-09 ENCOUNTER — Other Ambulatory Visit: Payer: Self-pay | Admitting: Internal Medicine

## 2023-10-14 ENCOUNTER — Other Ambulatory Visit: Payer: Self-pay | Admitting: Internal Medicine

## 2023-11-12 ENCOUNTER — Other Ambulatory Visit: Payer: Self-pay | Admitting: Family Medicine

## 2023-11-12 DIAGNOSIS — I10 Essential (primary) hypertension: Secondary | ICD-10-CM

## 2023-11-13 NOTE — Telephone Encounter (Signed)
LOV: 06/18/2022  Attempted to reach pt to schedule an appt.   Left a voicemail.

## 2023-11-18 ENCOUNTER — Other Ambulatory Visit (HOSPITAL_COMMUNITY): Payer: Self-pay

## 2023-12-04 ENCOUNTER — Other Ambulatory Visit: Payer: Self-pay | Admitting: Family Medicine

## 2023-12-04 DIAGNOSIS — Z794 Long term (current) use of insulin: Secondary | ICD-10-CM

## 2023-12-20 ENCOUNTER — Other Ambulatory Visit: Payer: Self-pay | Admitting: Gastroenterology

## 2023-12-28 ENCOUNTER — Other Ambulatory Visit: Payer: Self-pay | Admitting: Family Medicine

## 2023-12-28 DIAGNOSIS — Z794 Long term (current) use of insulin: Secondary | ICD-10-CM

## 2024-01-14 ENCOUNTER — Other Ambulatory Visit: Payer: Self-pay | Admitting: Gastroenterology

## 2024-03-31 ENCOUNTER — Encounter: Payer: Self-pay | Admitting: Acute Care

## 2024-10-19 ENCOUNTER — Ambulatory Visit: Admitting: Orthopaedic Surgery

## 2024-11-03 ENCOUNTER — Ambulatory Visit: Admitting: Orthopaedic Surgery
